# Patient Record
Sex: Female | Born: 1970 | Race: White | Hispanic: No | Marital: Married | Smoking: Former smoker
Health system: Southern US, Community
[De-identification: ages and names within clinical notes are randomized; demographics above are authoritative.]

## PROBLEM LIST (undated history)

## (undated) DIAGNOSIS — Z9889 Other specified postprocedural states: Secondary | ICD-10-CM

## (undated) DIAGNOSIS — Z1509 Genetic susceptibility to other malignant neoplasm: Secondary | ICD-10-CM

## (undated) DIAGNOSIS — Z87898 Personal history of other specified conditions: Secondary | ICD-10-CM

## (undated) DIAGNOSIS — Z1589 Genetic susceptibility to other disease: Secondary | ICD-10-CM

## (undated) DIAGNOSIS — E7212 Methylenetetrahydrofolate reductase deficiency: Secondary | ICD-10-CM

## (undated) DIAGNOSIS — Z923 Personal history of irradiation: Secondary | ICD-10-CM

## (undated) DIAGNOSIS — Z973 Presence of spectacles and contact lenses: Secondary | ICD-10-CM

## (undated) DIAGNOSIS — R51 Headache: Secondary | ICD-10-CM

## (undated) DIAGNOSIS — Z803 Family history of malignant neoplasm of breast: Secondary | ICD-10-CM

## (undated) DIAGNOSIS — Z1501 Genetic susceptibility to malignant neoplasm of breast: Secondary | ICD-10-CM

## (undated) DIAGNOSIS — R519 Headache, unspecified: Secondary | ICD-10-CM

## (undated) DIAGNOSIS — Z9221 Personal history of antineoplastic chemotherapy: Secondary | ICD-10-CM

## (undated) DIAGNOSIS — D649 Anemia, unspecified: Secondary | ICD-10-CM

## (undated) DIAGNOSIS — R112 Nausea with vomiting, unspecified: Secondary | ICD-10-CM

## (undated) DIAGNOSIS — C50412 Malignant neoplasm of upper-outer quadrant of left female breast: Secondary | ICD-10-CM

## (undated) DIAGNOSIS — D693 Immune thrombocytopenic purpura: Secondary | ICD-10-CM

## (undated) DIAGNOSIS — F419 Anxiety disorder, unspecified: Secondary | ICD-10-CM

## (undated) DIAGNOSIS — Z8041 Family history of malignant neoplasm of ovary: Secondary | ICD-10-CM

## (undated) HISTORY — DX: Genetic susceptibility to other disease: Z15.89

## (undated) HISTORY — DX: Family history of malignant neoplasm of ovary: Z80.41

## (undated) HISTORY — PX: WISDOM TOOTH EXTRACTION: SHX21

## (undated) HISTORY — DX: Family history of malignant neoplasm of breast: Z80.3

## (undated) HISTORY — DX: Immune thrombocytopenic purpura: D69.3

## (undated) HISTORY — DX: Methylenetetrahydrofolate reductase deficiency: E72.12

## (undated) HISTORY — PX: MASTECTOMY: SHX3

---

## 1999-01-09 ENCOUNTER — Encounter: Payer: Self-pay | Admitting: Obstetrics & Gynecology

## 1999-01-09 ENCOUNTER — Inpatient Hospital Stay (HOSPITAL_COMMUNITY): Admission: AD | Admit: 1999-01-09 | Discharge: 1999-01-09 | Payer: Self-pay | Admitting: Obstetrics and Gynecology

## 1999-03-19 ENCOUNTER — Encounter: Payer: Self-pay | Admitting: Obstetrics and Gynecology

## 1999-03-19 ENCOUNTER — Ambulatory Visit (HOSPITAL_COMMUNITY): Admission: RE | Admit: 1999-03-19 | Discharge: 1999-03-19 | Payer: Self-pay | Admitting: Obstetrics and Gynecology

## 1999-05-15 ENCOUNTER — Observation Stay (HOSPITAL_COMMUNITY): Admission: AD | Admit: 1999-05-15 | Discharge: 1999-05-15 | Payer: Self-pay | Admitting: Obstetrics and Gynecology

## 1999-07-01 ENCOUNTER — Inpatient Hospital Stay: Admission: AD | Admit: 1999-07-01 | Discharge: 1999-07-01 | Payer: Self-pay | Admitting: *Deleted

## 1999-08-03 ENCOUNTER — Inpatient Hospital Stay (HOSPITAL_COMMUNITY): Admission: AD | Admit: 1999-08-03 | Discharge: 1999-08-03 | Payer: Self-pay | Admitting: Obstetrics & Gynecology

## 1999-08-16 ENCOUNTER — Inpatient Hospital Stay (HOSPITAL_COMMUNITY): Admission: AD | Admit: 1999-08-16 | Discharge: 1999-08-19 | Payer: Self-pay | Admitting: *Deleted

## 2001-04-24 ENCOUNTER — Other Ambulatory Visit: Admission: RE | Admit: 2001-04-24 | Discharge: 2001-04-24 | Payer: Self-pay | Admitting: Obstetrics and Gynecology

## 2003-06-03 ENCOUNTER — Other Ambulatory Visit: Admission: RE | Admit: 2003-06-03 | Discharge: 2003-06-03 | Payer: Self-pay | Admitting: *Deleted

## 2003-06-03 ENCOUNTER — Other Ambulatory Visit: Admission: RE | Admit: 2003-06-03 | Discharge: 2003-06-03 | Payer: Self-pay | Admitting: Obstetrics and Gynecology

## 2003-11-14 ENCOUNTER — Inpatient Hospital Stay (HOSPITAL_COMMUNITY): Admission: AD | Admit: 2003-11-14 | Discharge: 2003-11-17 | Payer: Self-pay | Admitting: Obstetrics and Gynecology

## 2003-11-17 IMAGING — US US OB LIMITED
1 series · 4 of 4 positions shown · non-contrast
Comparison: none

CLINICAL DATA: 33 weeks estimated gestational age.  Assess amniotic fluid index.

[Series 1: unknown · 0.32mm/px · 4 of 4 slices shown]
[im 1/4]
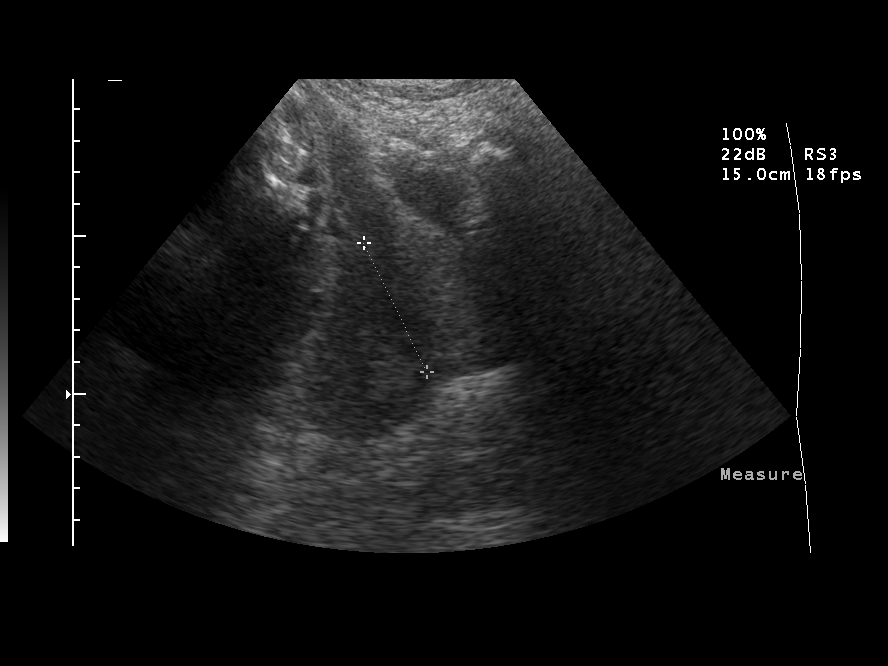
[im 2/4]
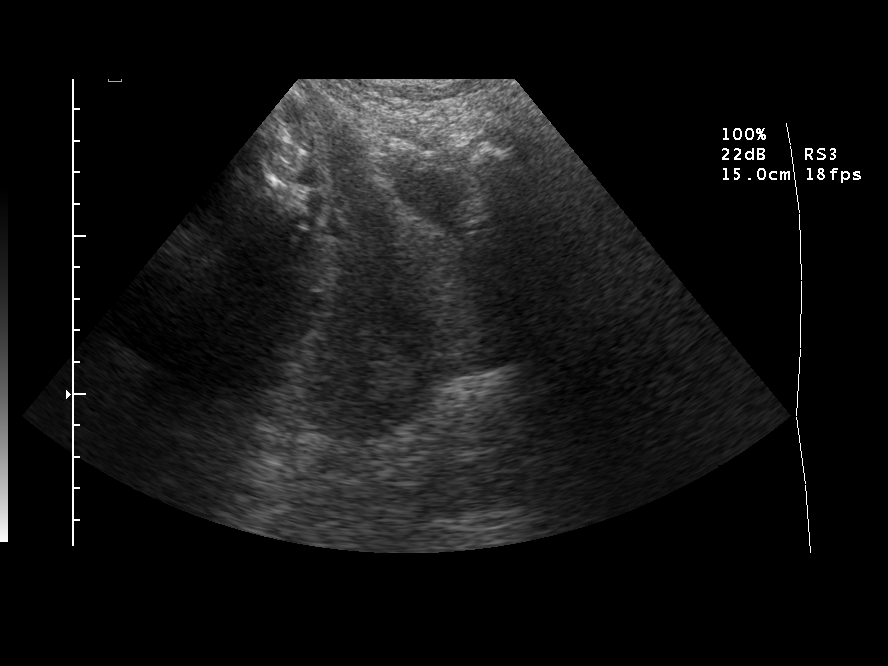
[im 3/4]
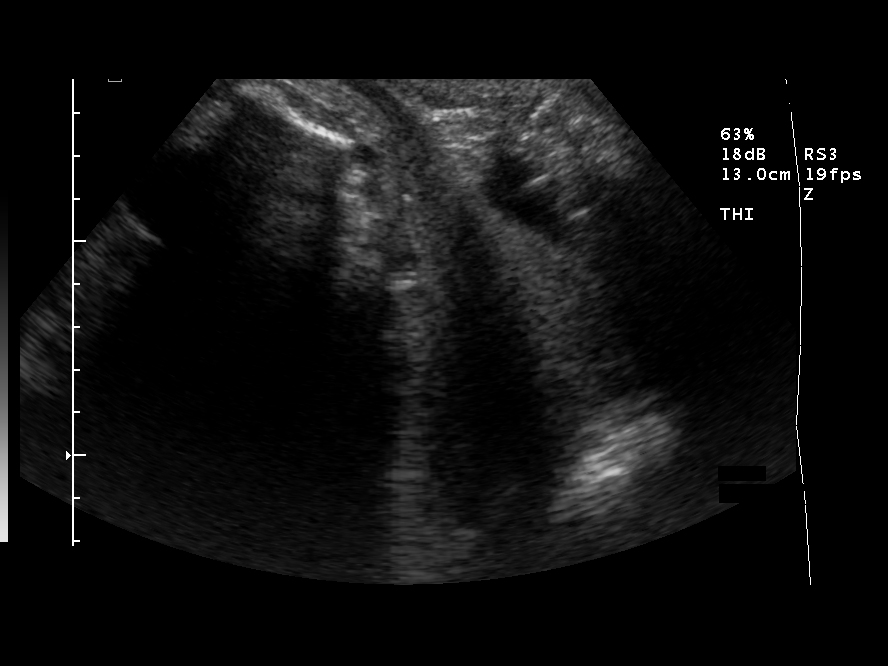
[im 4/4]
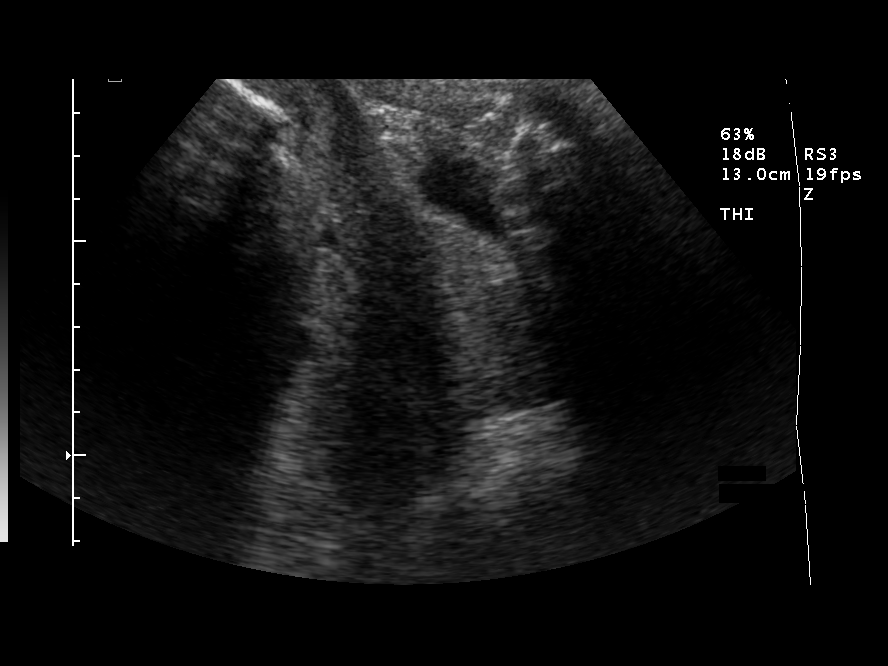

[4 of 4 positions shown; findings below may reference images not displayed]

LIMITED OBSTETRICAL ULTRASOUND
Number of Fetuses:  1
Heart Rate:  140
Movement:  Yes
Breathing:  No
Presentation:  Oblique/transverse with head to maternal right
Placental Location:  Fundal, left lateral
Grade:  II
Previa:  No
Amniotic Fluid (Subjective):  Low normal
Amniotic Fluid (Objective):  12.7 cm AFI (5th -95th%ile =   8.1 ? 24.8 cm for 34 wks)

Fetal measurements and complete anatomic evaluation were not requested.  The following fetal anatomy was visualized during this exam:  Lateral ventricles, thalami, stomach, 3-vessel cord, kidneys, and bladder.

MATERNAL FINDINGS
Cervix:  4.5 cm Transabdominally
IMPRESSION: Subjectively low normal and quantitatively normal amniotic fluid volume.
Normal cervical length.  
No late developing fetal anatomic abnormalities are identified associated with the lateral ventricles, stomach, kidneys, bladder or four chamber heart.

## 2003-11-17 IMAGING — US US OB LIMITED
1 series · 14 of 28 positions shown · non-contrast
Comparison: none

CLINICAL DATA: 33 weeks estimated gestational age.  Assess amniotic fluid index.

[Series 1: unknown · 0.30mm/px · 14 of 32 slices shown]
[im 2/32]
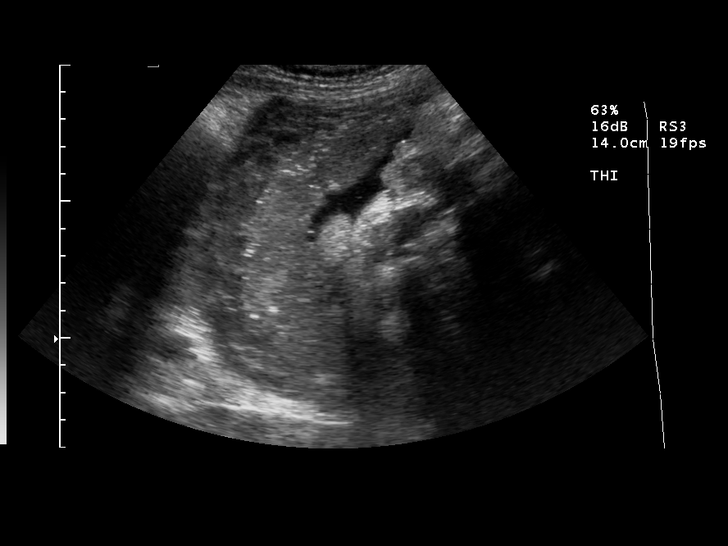
[im 4/32]
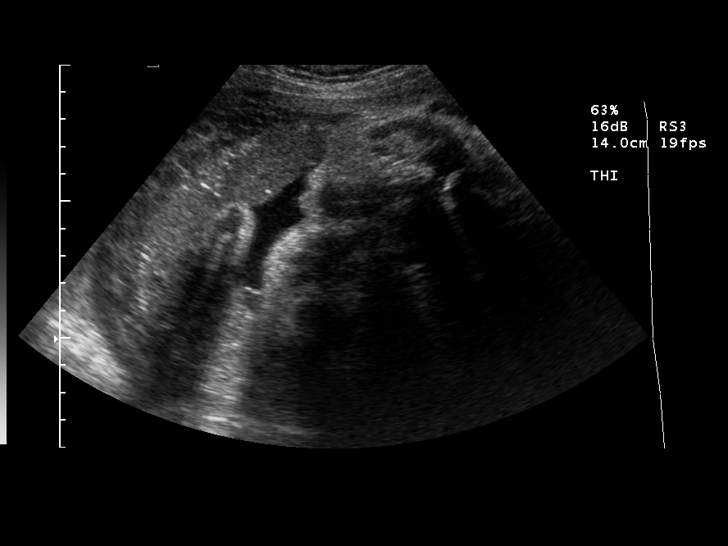
[im 6/32]
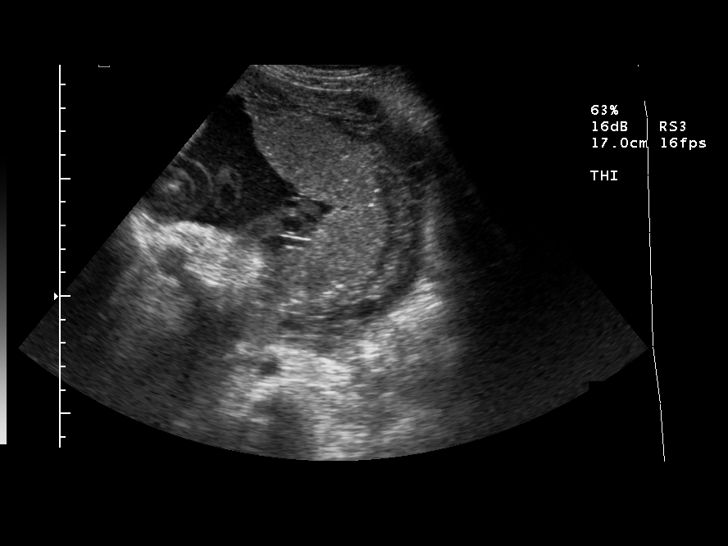
[im 9/32]
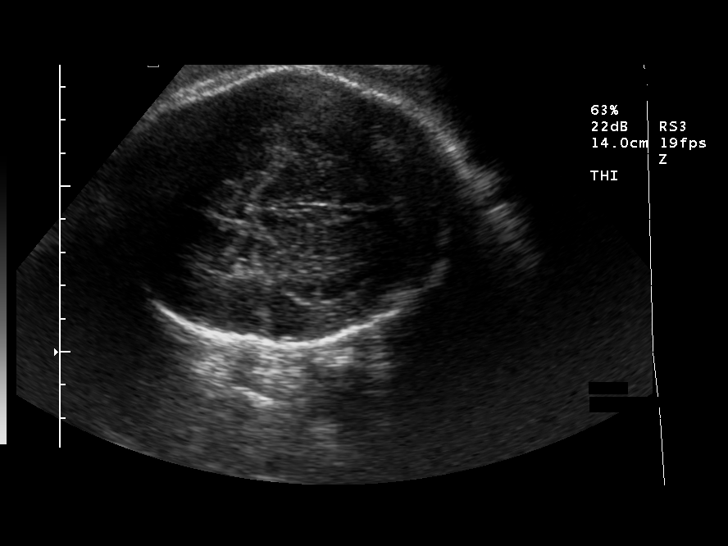
[im 11/32]
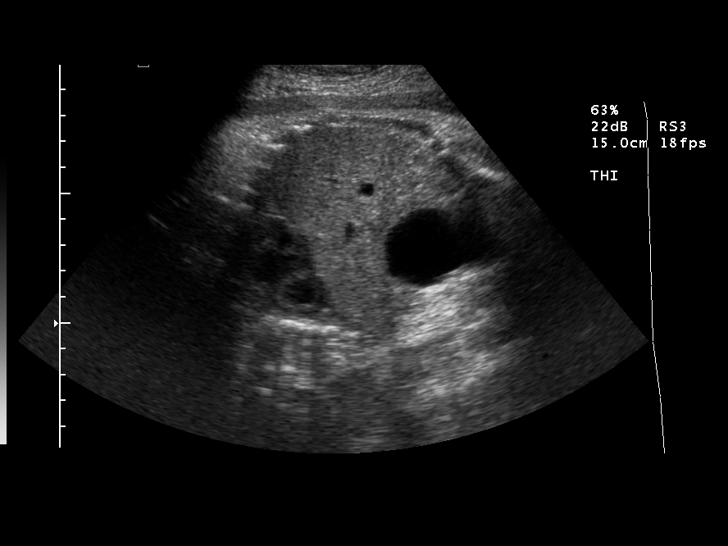
[im 13/32]
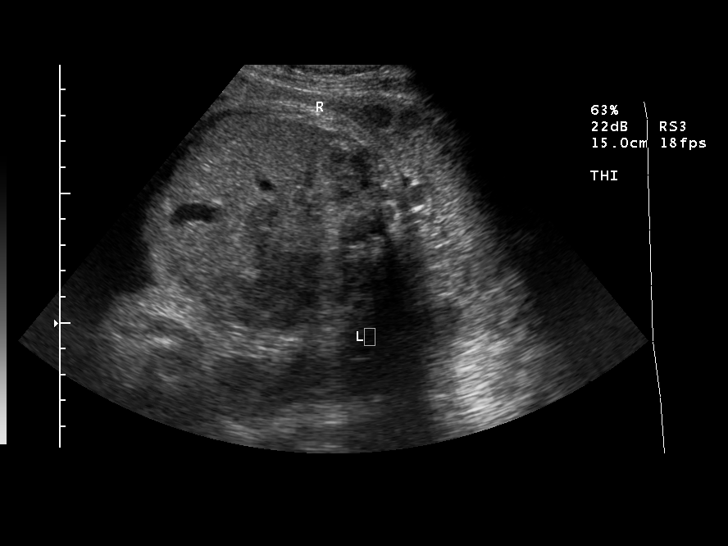
[im 15/32]
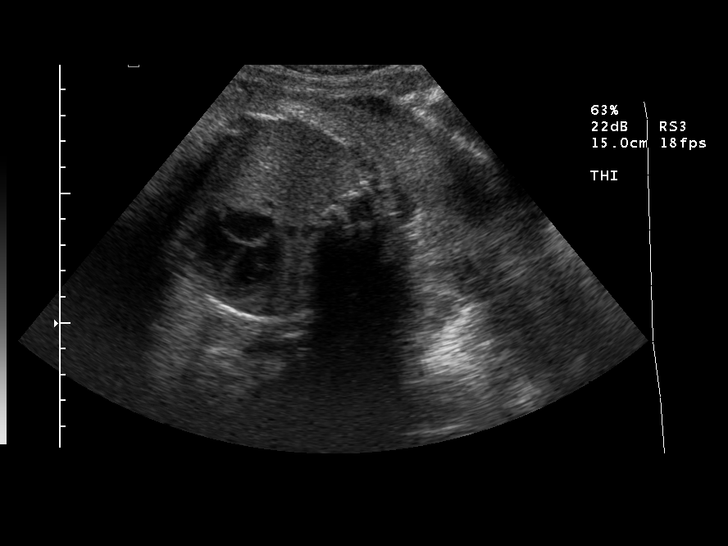
[im 18/32]
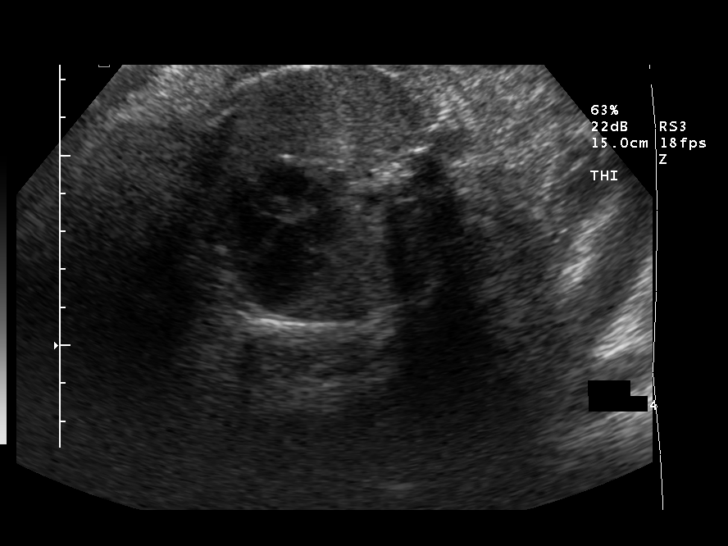
[im 20/32]
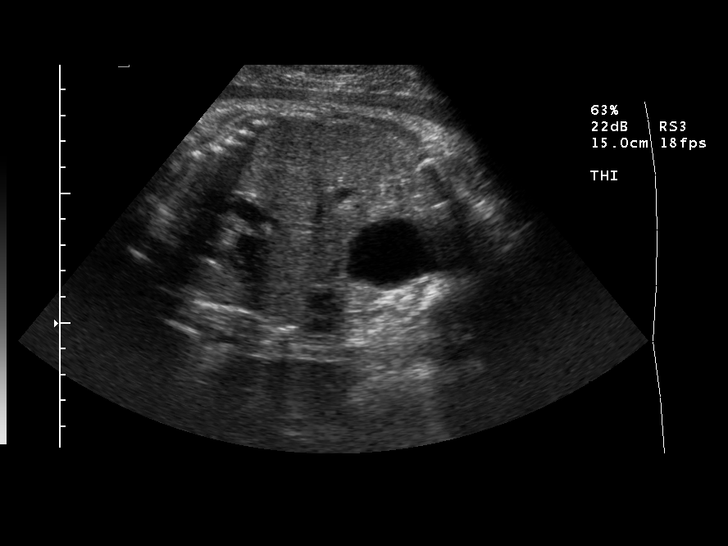
[im 22/32]
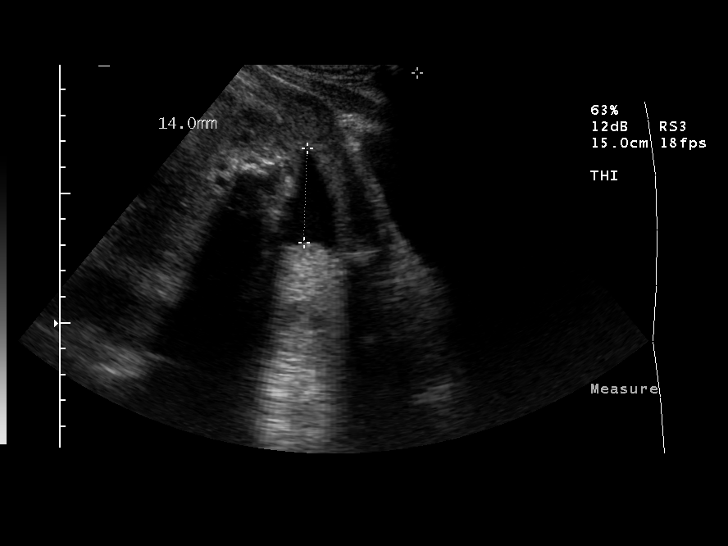
[im 25/32]
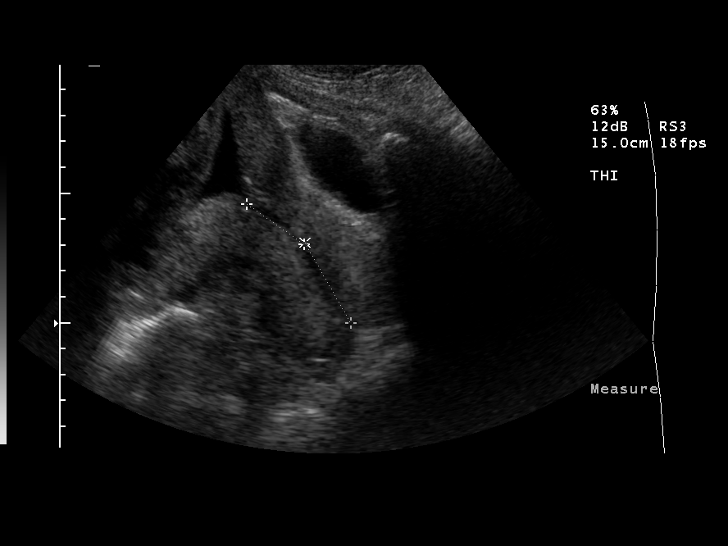
[im 27/32]
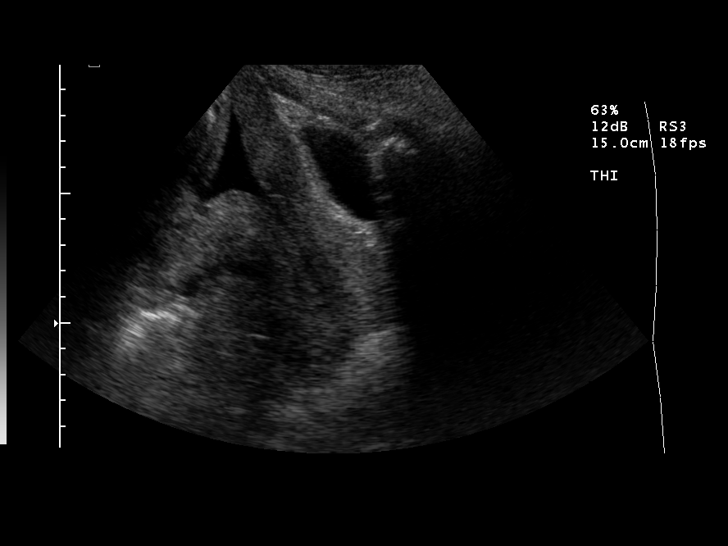
[im 29/32]
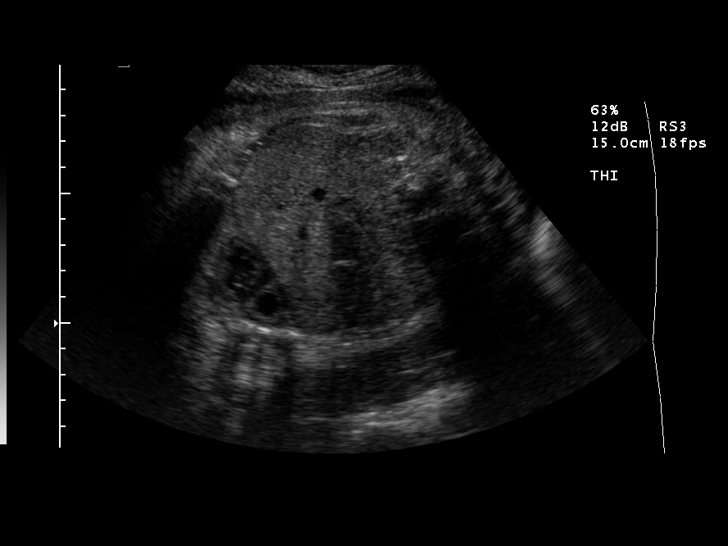
[im 32/32]
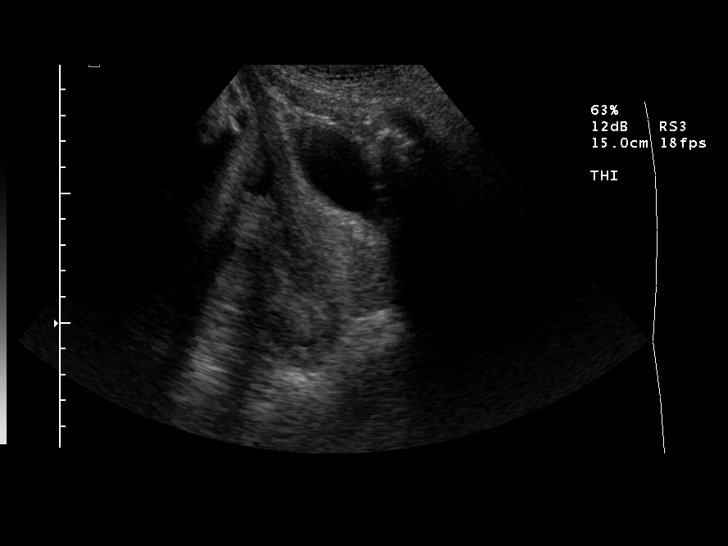

[14 of 28 positions shown; findings below may reference images not displayed]

LIMITED OBSTETRICAL ULTRASOUND
Number of Fetuses:  1
Heart Rate:  140
Movement:  Yes
Breathing:  No
Presentation:  Oblique/transverse with head to maternal right
Placental Location:  Fundal, left lateral
Grade:  II
Previa:  No
Amniotic Fluid (Subjective):  Low normal
Amniotic Fluid (Objective):  12.7 cm AFI (5th -95th%ile =   8.1 ? 24.8 cm for 34 wks)

Fetal measurements and complete anatomic evaluation were not requested.  The following fetal anatomy was visualized during this exam:  Lateral ventricles, thalami, stomach, 3-vessel cord, kidneys, and bladder.

MATERNAL FINDINGS
Cervix:  4.5 cm Transabdominally
IMPRESSION: Subjectively low normal and quantitatively normal amniotic fluid volume.
Normal cervical length.  
No late developing fetal anatomic abnormalities are identified associated with the lateral ventricles, stomach, kidneys, bladder or four chamber heart.

## 2003-12-20 ENCOUNTER — Inpatient Hospital Stay (HOSPITAL_COMMUNITY): Admission: AD | Admit: 2003-12-20 | Discharge: 2003-12-22 | Payer: Self-pay | Admitting: Obstetrics and Gynecology

## 2004-06-03 ENCOUNTER — Other Ambulatory Visit: Admission: RE | Admit: 2004-06-03 | Discharge: 2004-06-03 | Payer: Self-pay | Admitting: Obstetrics and Gynecology

## 2005-04-07 ENCOUNTER — Ambulatory Visit: Payer: Self-pay | Admitting: Family Medicine

## 2005-07-13 ENCOUNTER — Ambulatory Visit: Payer: Self-pay | Admitting: Family Medicine

## 2005-08-03 ENCOUNTER — Other Ambulatory Visit: Admission: RE | Admit: 2005-08-03 | Discharge: 2005-08-03 | Payer: Self-pay | Admitting: Obstetrics and Gynecology

## 2006-03-05 IMAGING — CR DG CHEST 2V
2 series · 2 of 2 positions shown · non-contrast
Comparison: none

CLINICAL DATA: Wheezing, cough, congestion for about a month.  Smoker.
 CHEST - 2 VIEW:
 No priors for comparison.

[view not recorded (1 of 2)]
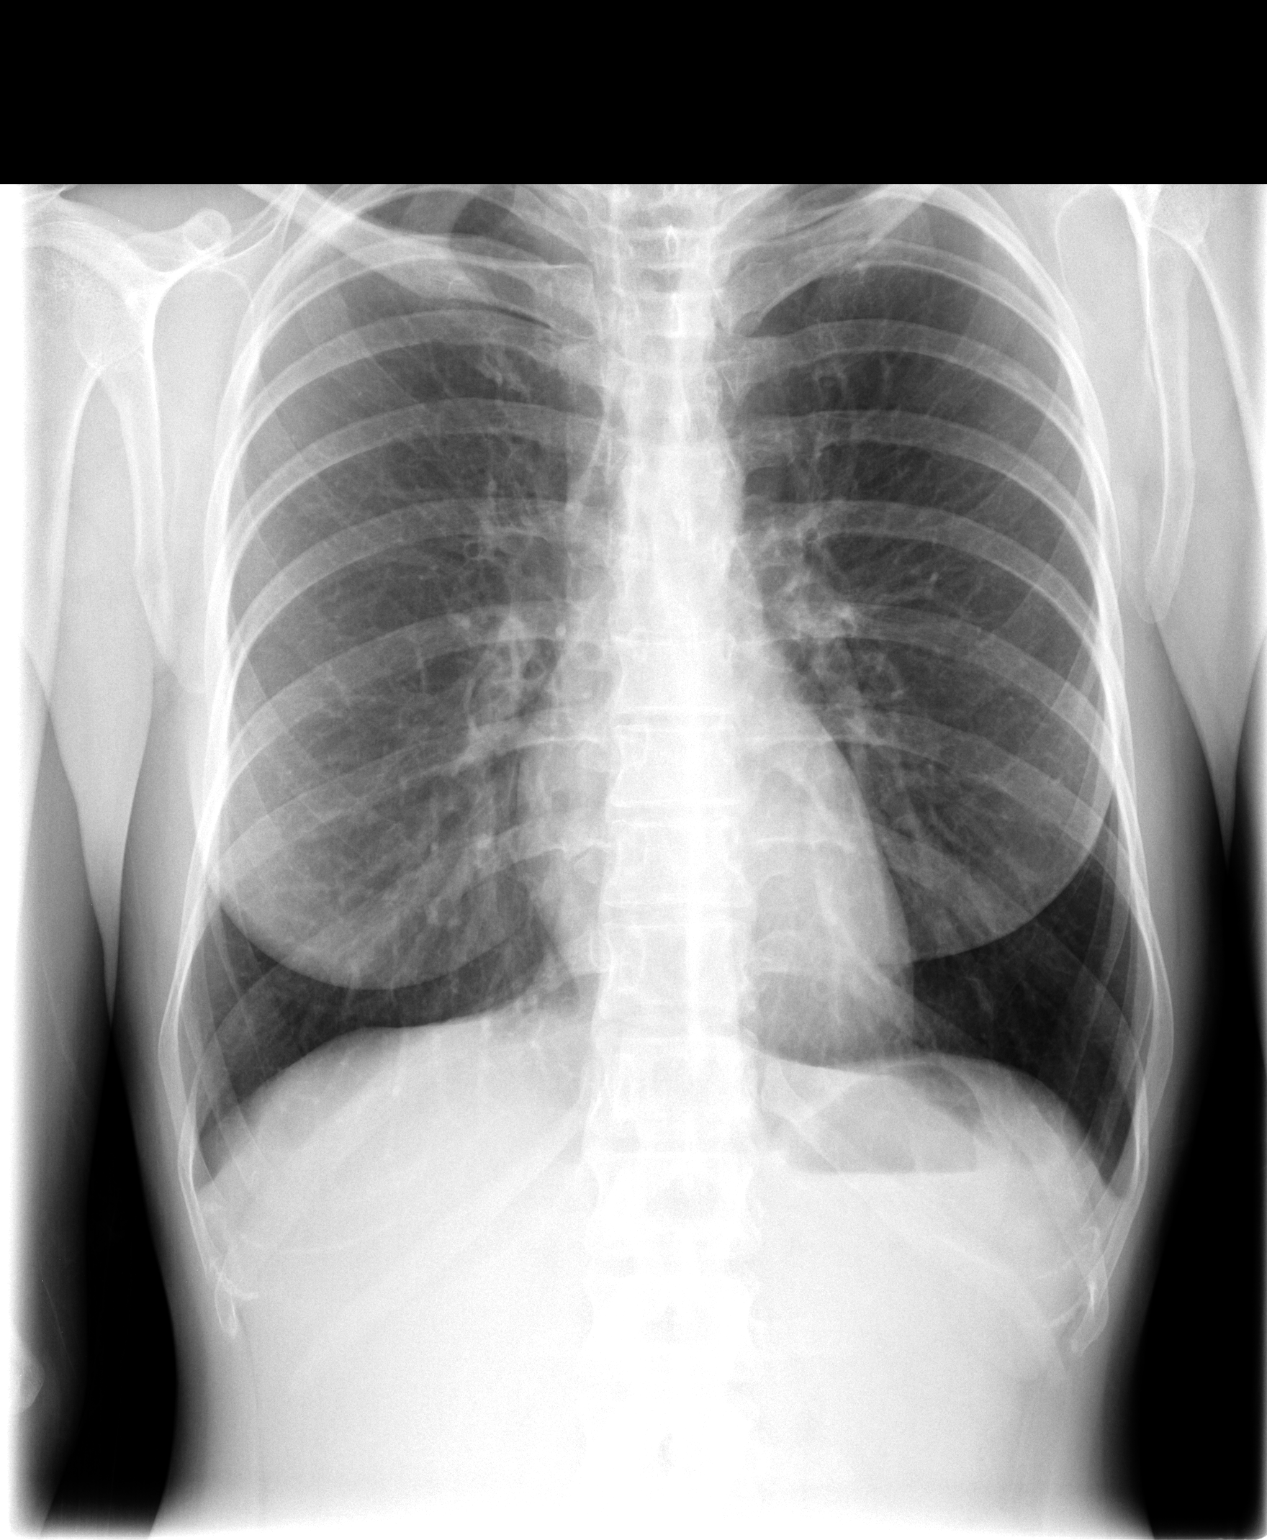

[view not recorded (2 of 2)]
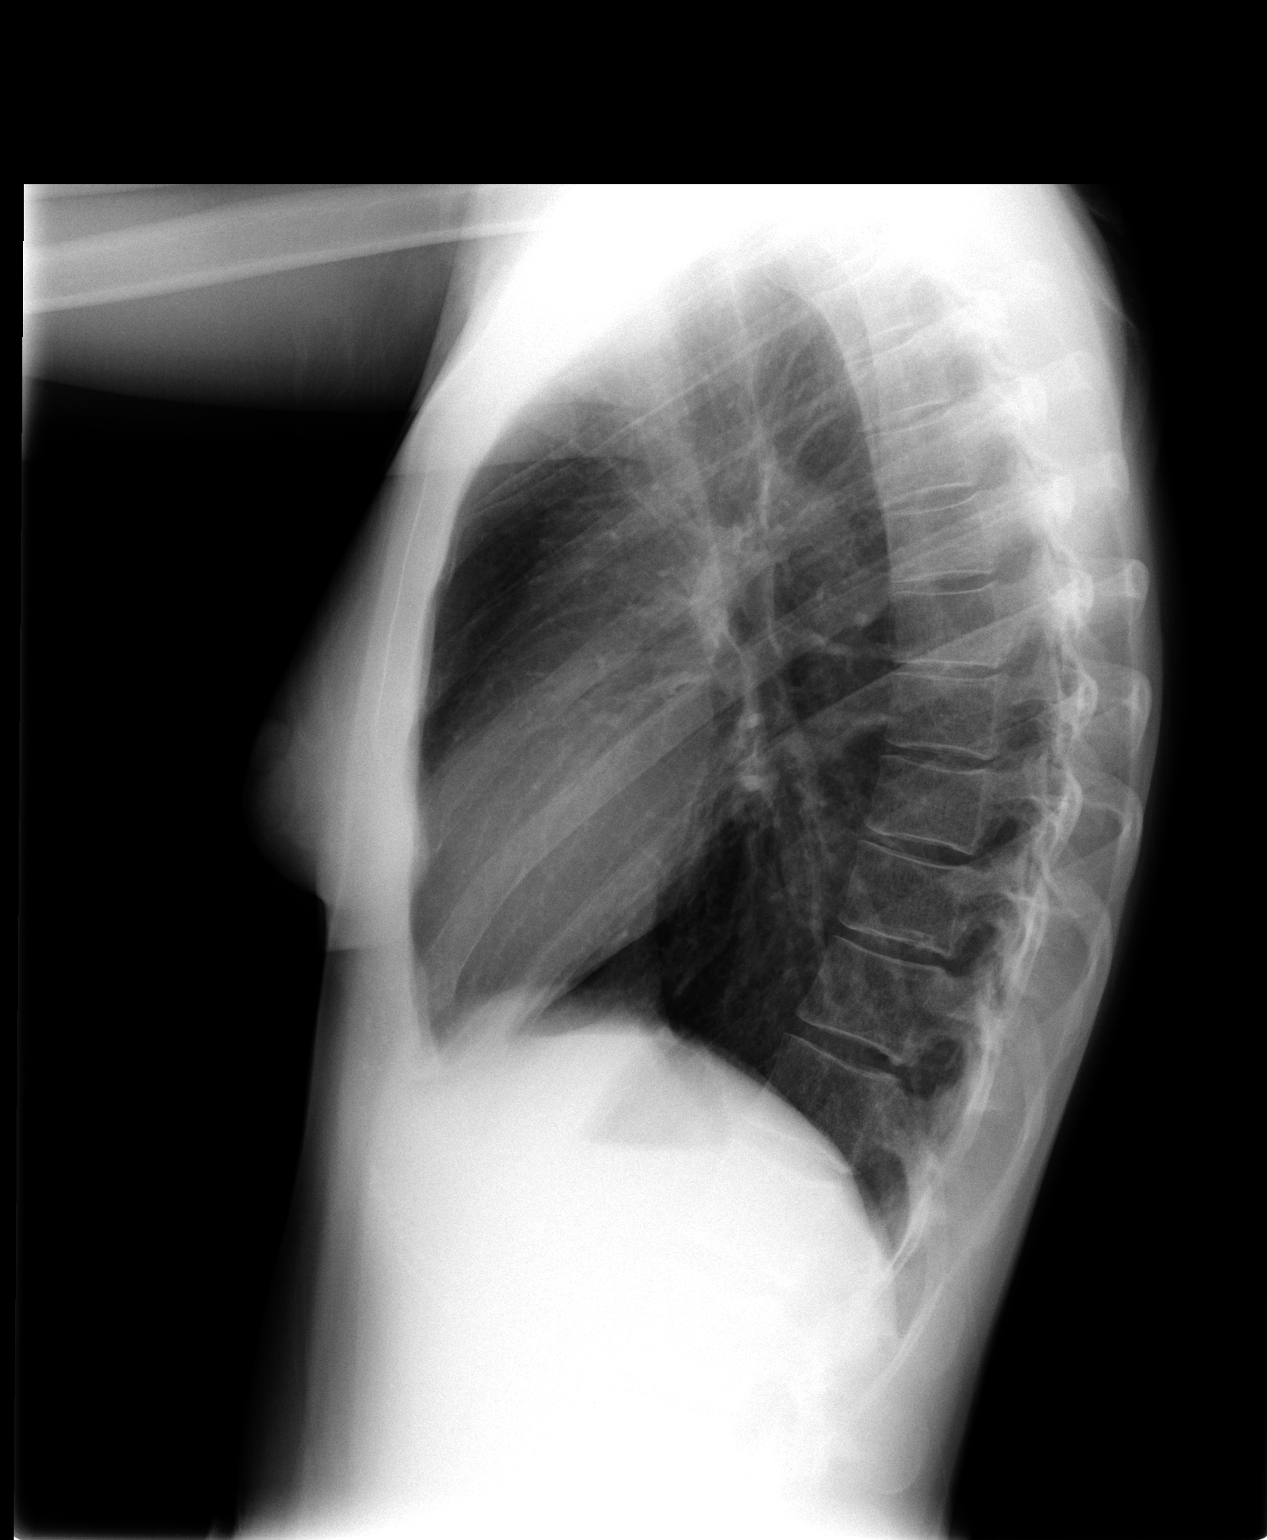

[2 of 2 positions shown; findings below may reference images not displayed]

FINDINGS: The heart size and mediastinal contours are within normal limits.  Both lungs are clear.  The visualized skeletal structures are unremarkable.
IMPRESSION: No active cardiopulmonary disease.

## 2006-10-26 ENCOUNTER — Ambulatory Visit: Payer: Self-pay | Admitting: Family Medicine

## 2006-11-06 ENCOUNTER — Ambulatory Visit: Payer: Self-pay | Admitting: Internal Medicine

## 2006-11-14 ENCOUNTER — Encounter: Admission: RE | Admit: 2006-11-14 | Discharge: 2006-11-14 | Payer: Self-pay | Admitting: Family Medicine

## 2006-11-14 ENCOUNTER — Ambulatory Visit: Payer: Self-pay | Admitting: Family Medicine

## 2006-11-14 IMAGING — CR DG SINUSES COMPLETE 3+V
3 series · 3 of 3 positions shown · non-contrast
Comparison: none

CLINICAL DATA: Bilateral facial pain, greater on the right. Headaches for the
past month.

PARANASAL SINUSES - 3 VIEW

[view not recorded (1 of 3)]
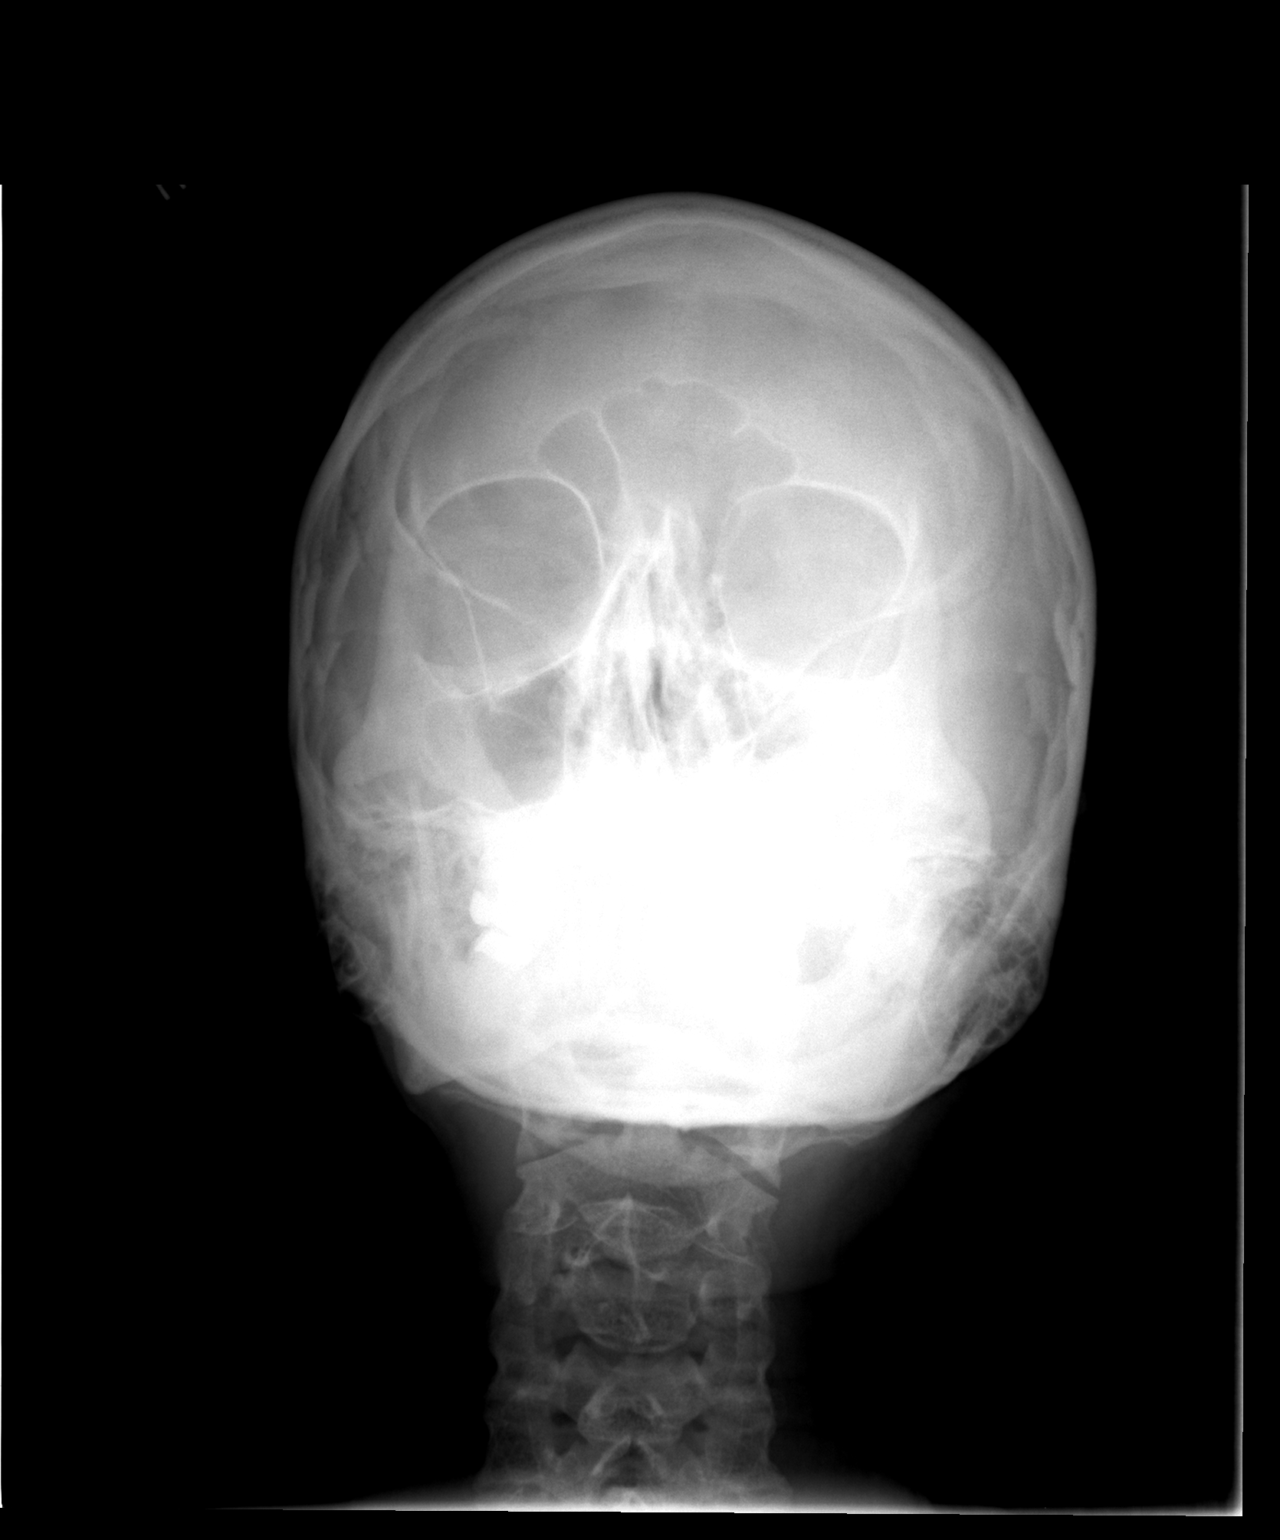

[view not recorded (2 of 3)]
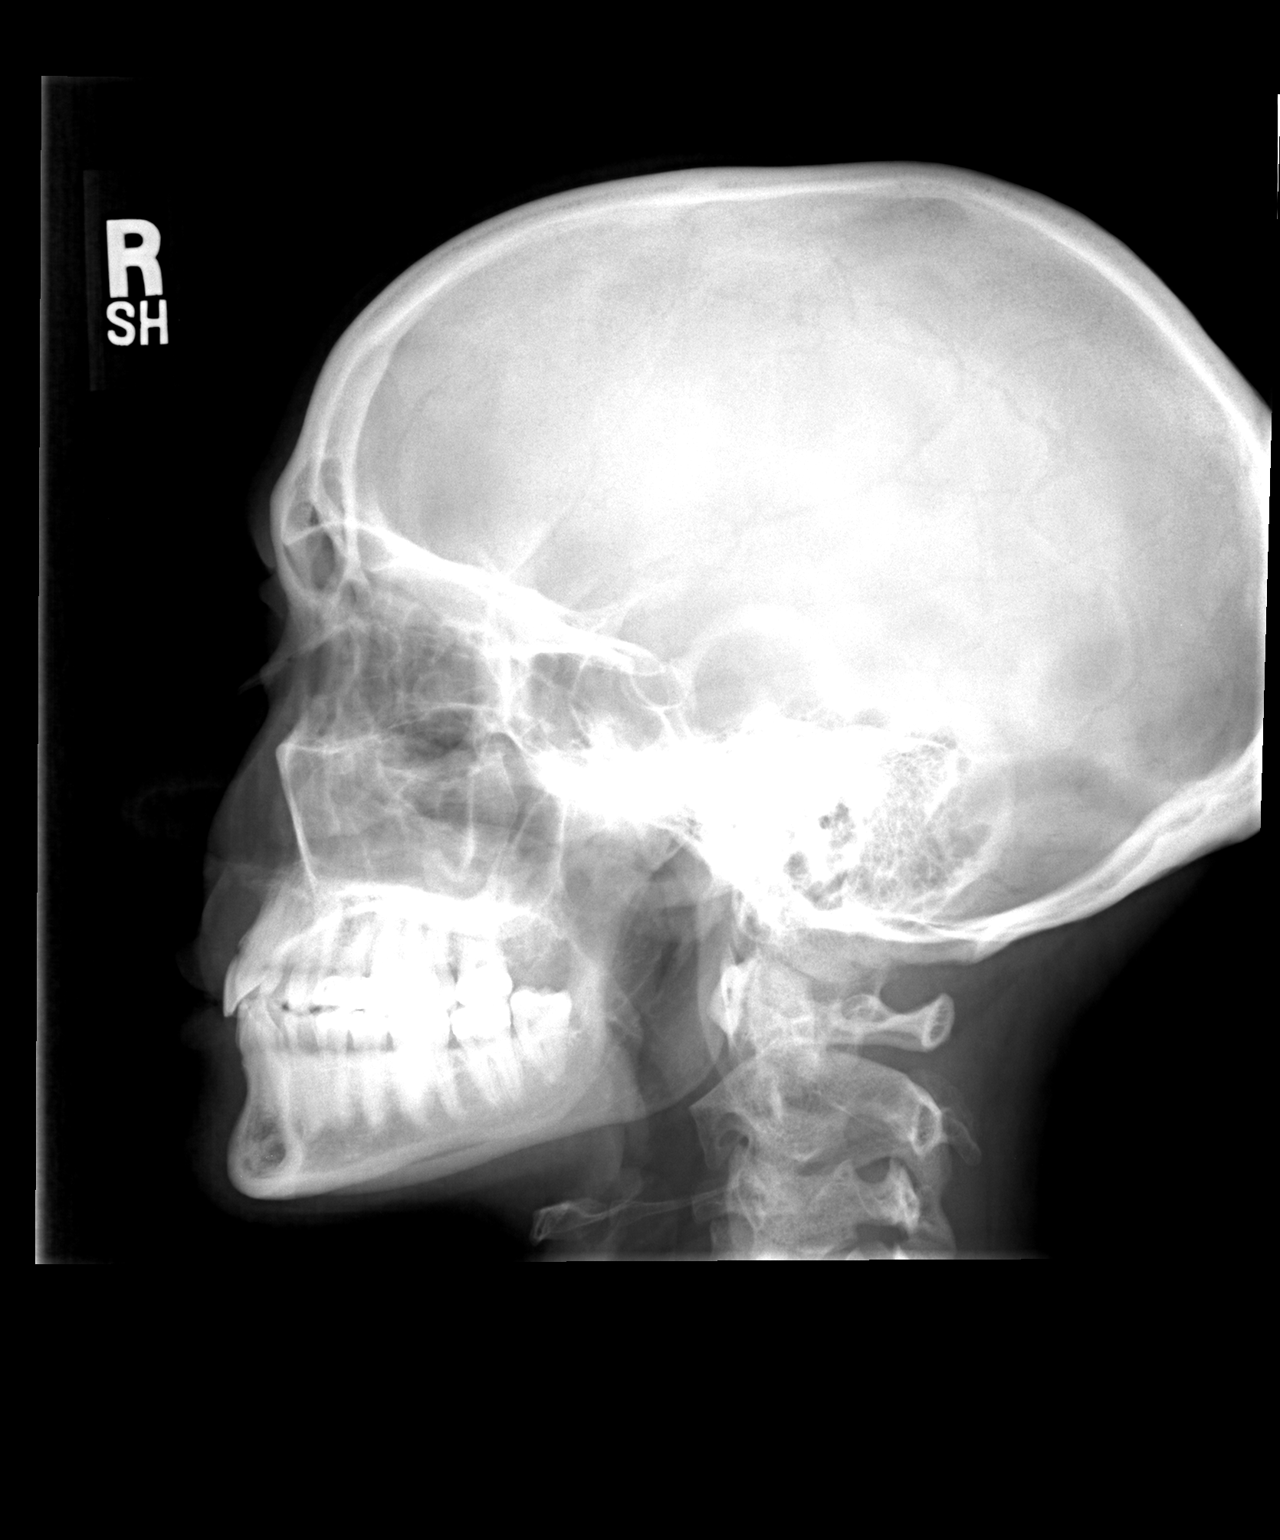

[view not recorded (3 of 3)]
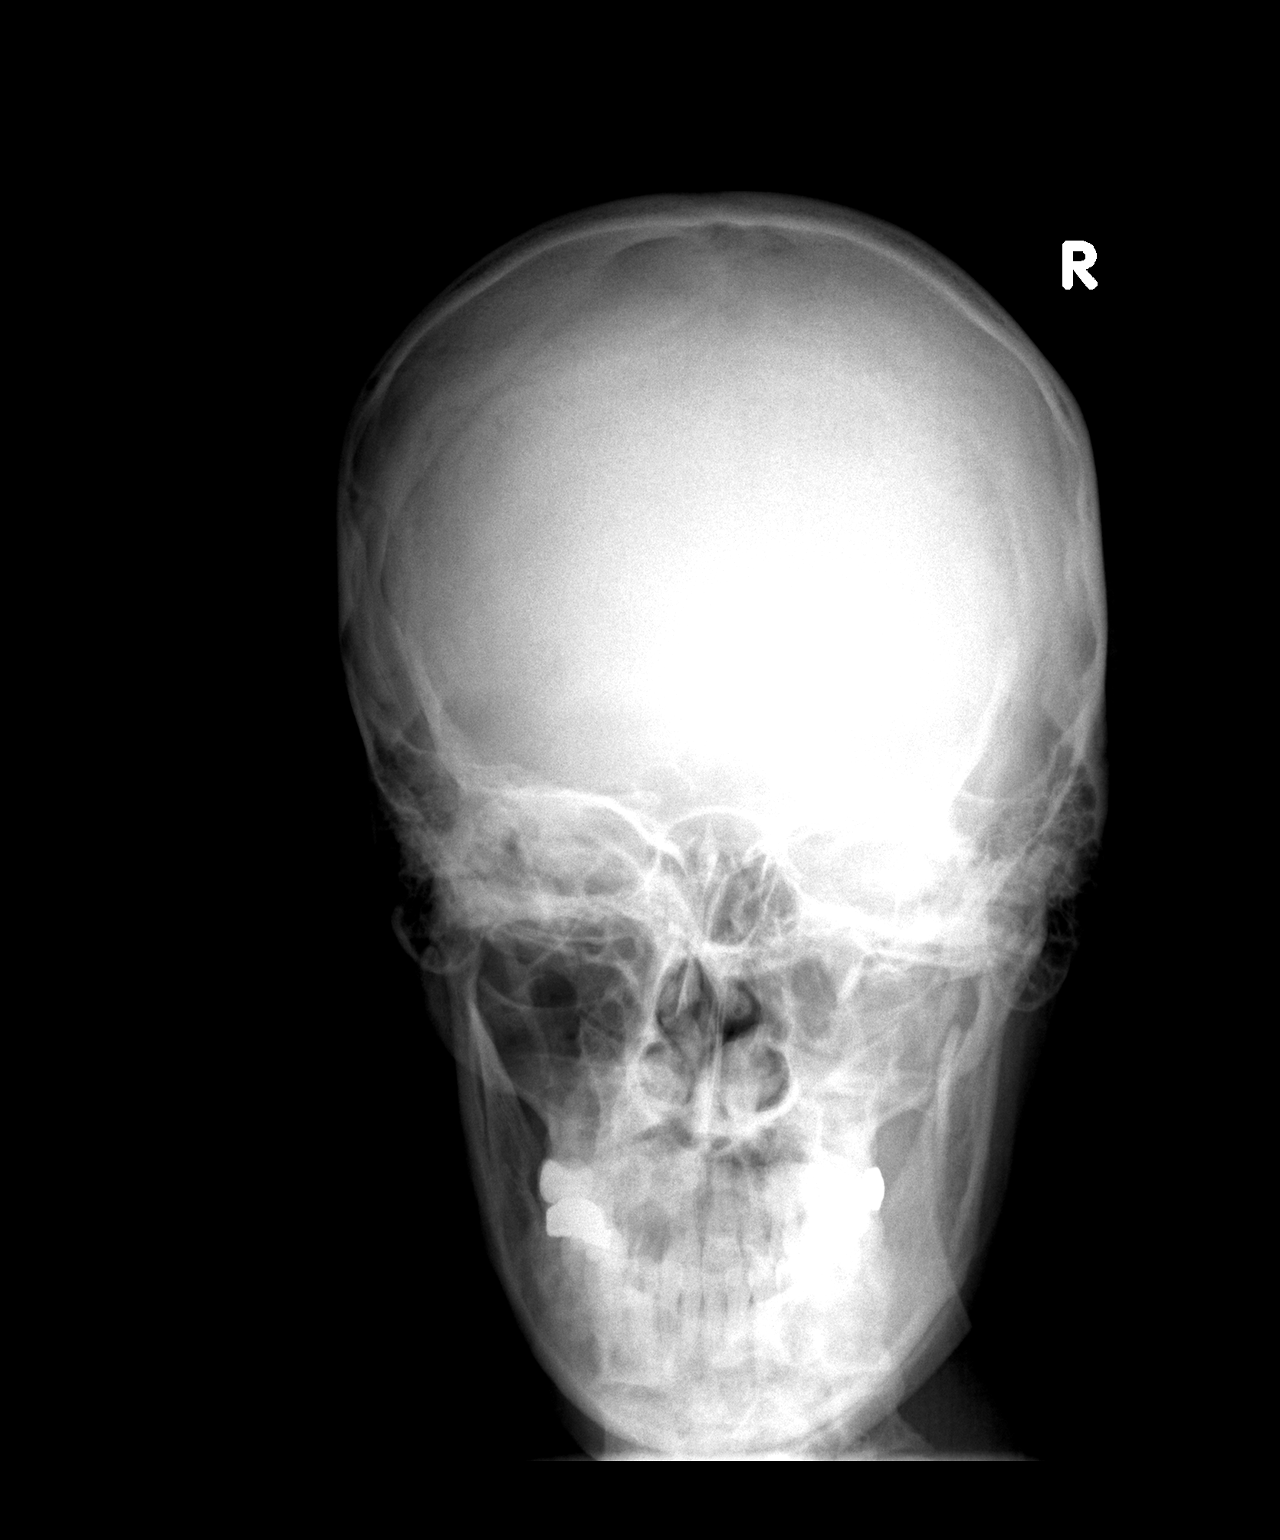

[3 of 3 positions shown; findings below may reference images not displayed]

FINDINGS: Marked left and moderate right maxillary sinus mucosal thickening. No
definite air-fluid levels. The remainder of the paranasal sinuses are grossly
normally pneumatized.

IMPRESSION

Marked left and moderate right chronic maxillary sinusitis.

## 2006-11-23 ENCOUNTER — Ambulatory Visit: Payer: Self-pay | Admitting: Family Medicine

## 2006-11-23 ENCOUNTER — Ambulatory Visit (HOSPITAL_COMMUNITY): Admission: RE | Admit: 2006-11-23 | Discharge: 2006-11-23 | Payer: Self-pay | Admitting: Family Medicine

## 2006-11-23 LAB — CONVERTED CEMR LAB: hCG, Beta Chain, Quant, S: <2 milliintl units/mL

## 2006-11-23 IMAGING — US US PELVIS COMPLETE MODIFY
1 series · 13 of 25 positions shown · non-contrast
Comparison: none

CLINICAL DATA: Right lower quadrant pain. Evaluate for tubal gestation.  The patient reports negative home pregnancy test.  
 TRANSABDOMINAL AND TRANSVAGINAL PELVIC ULTRASOUND:
TECHNIQUE: Both transabdominal and transvaginal ultrasound examinations of the pelvis were performed including evaluation of the uterus, ovaries, adnexal regions, and pelvic cul-de-sac.

[Series 1: us pelvis complete · 13 of 55 slices shown]
[im 1/55]
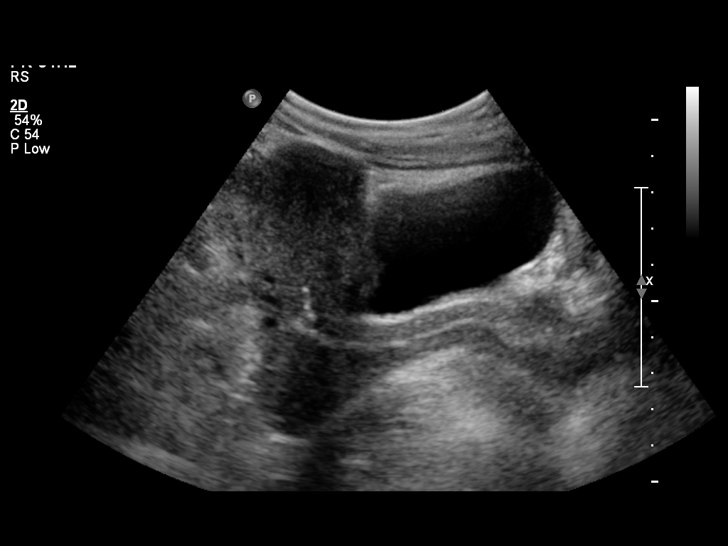
[im 5/55]
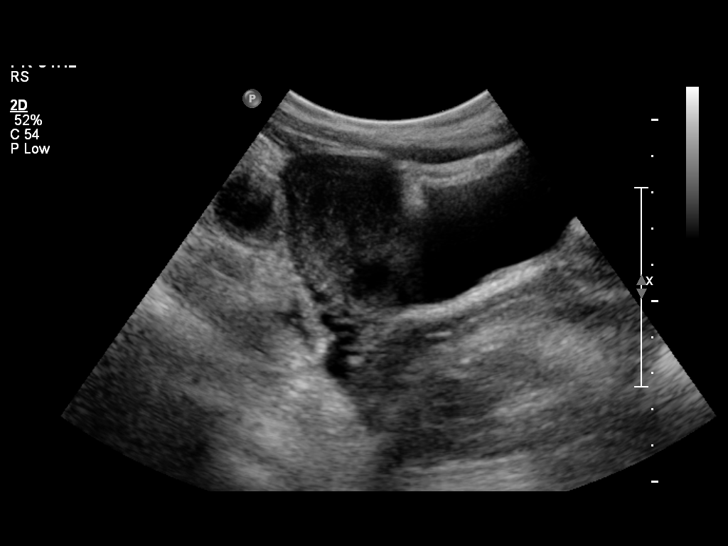
[im 10/55]
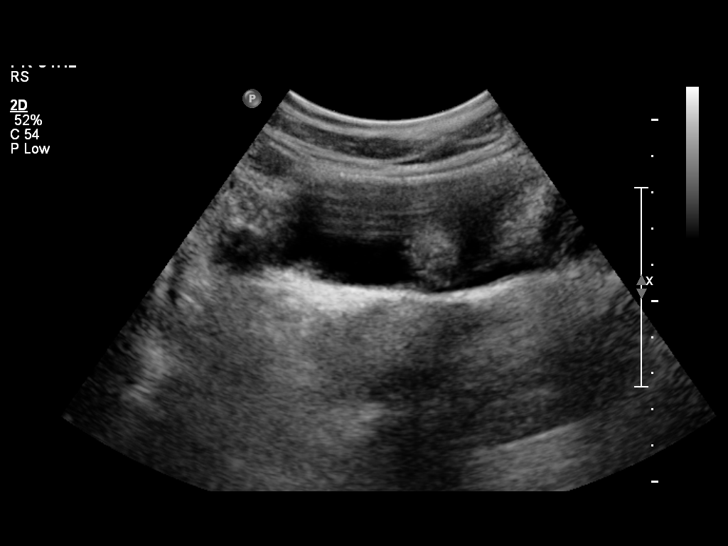
[im 14/55]
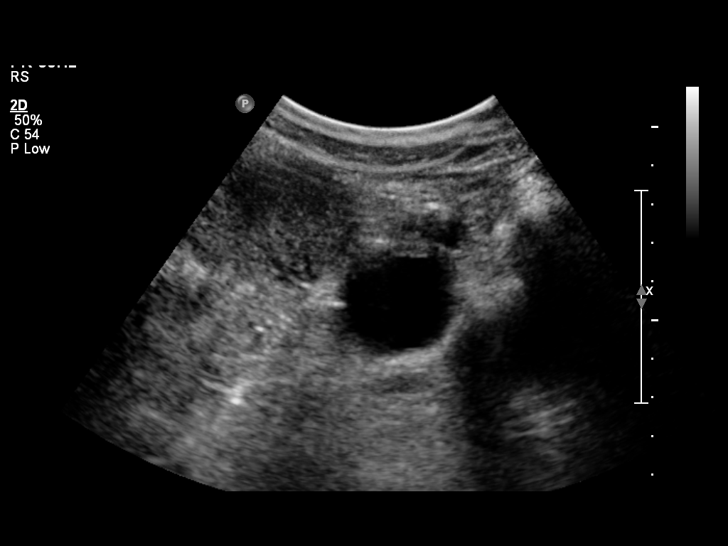
[im 19/55]
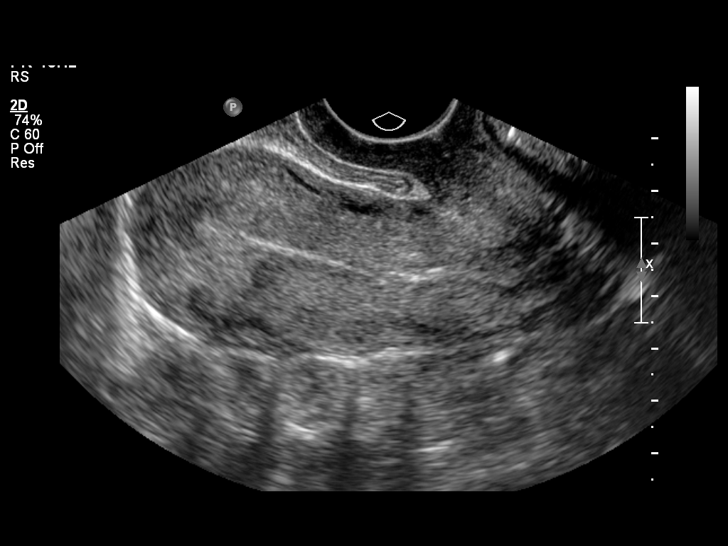
[im 23/55]
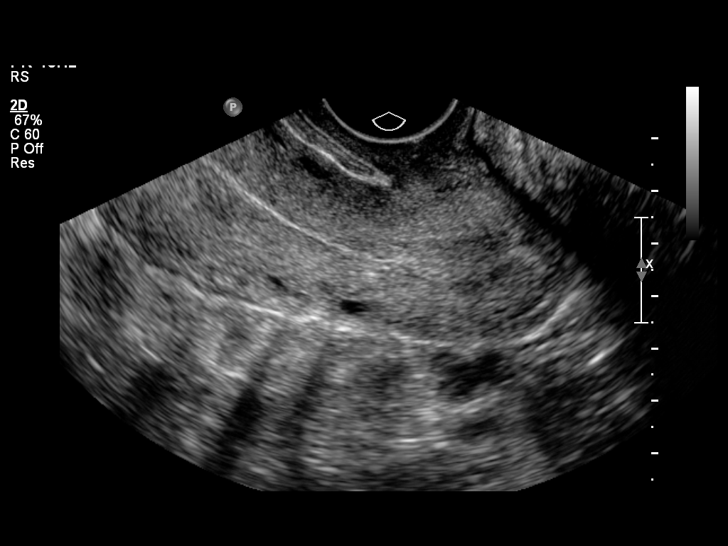
[im 28/55]
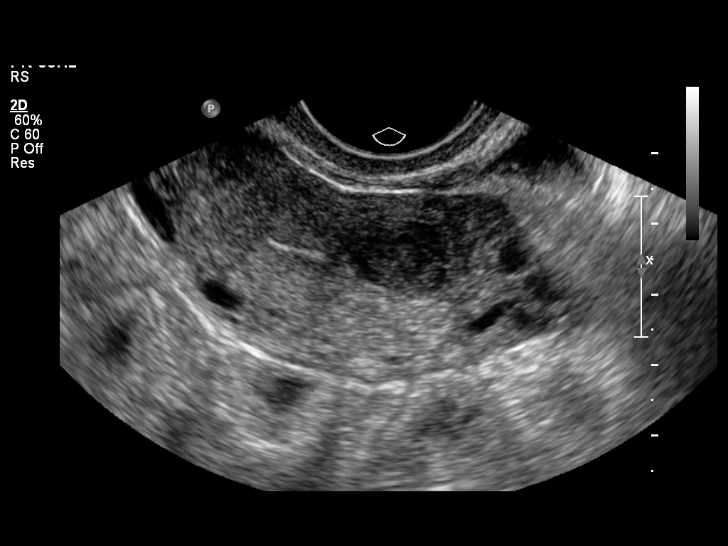
[im 32/55]
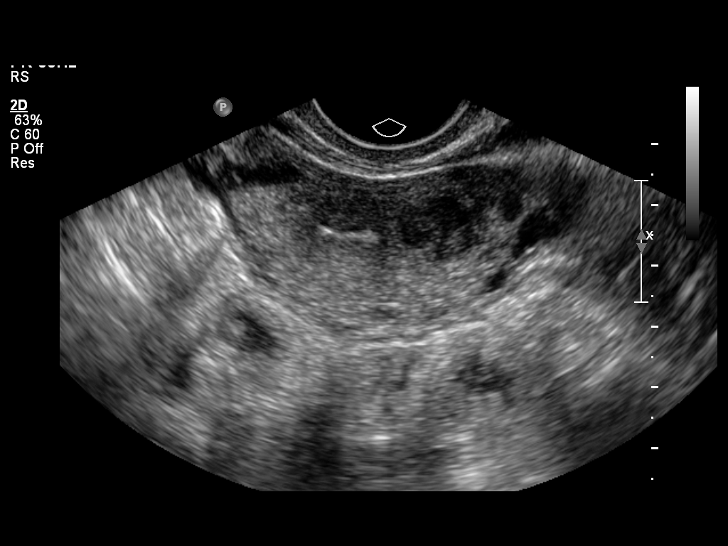
[im 37/55]
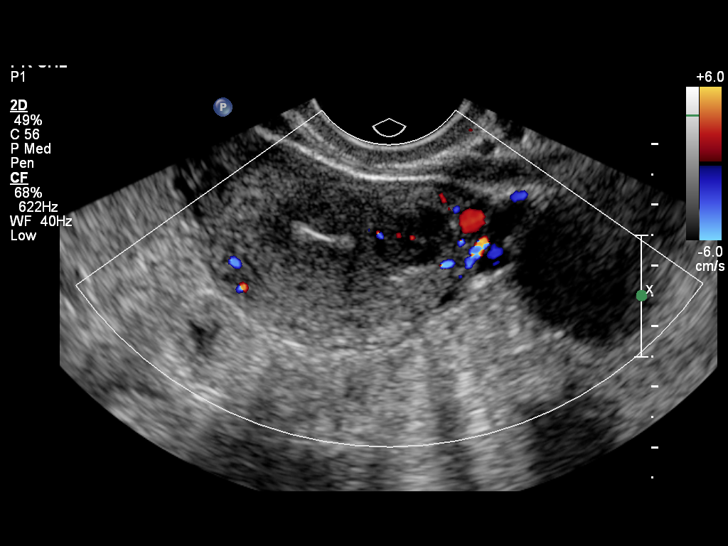
[im 41/55]
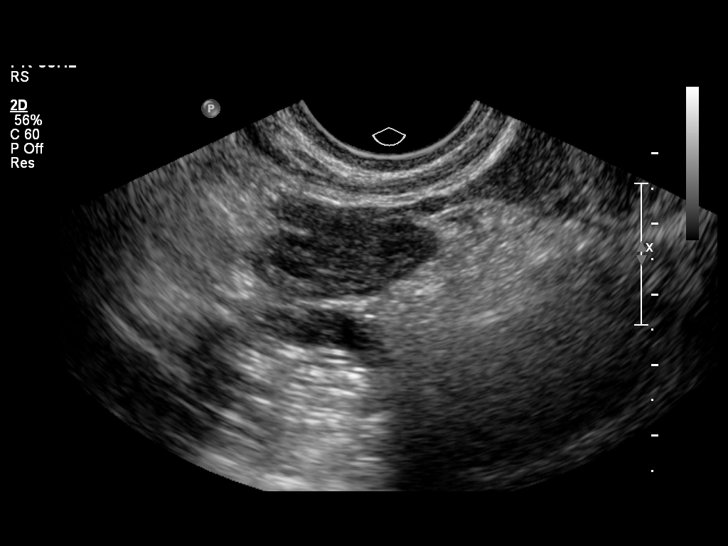
[im 46/55]
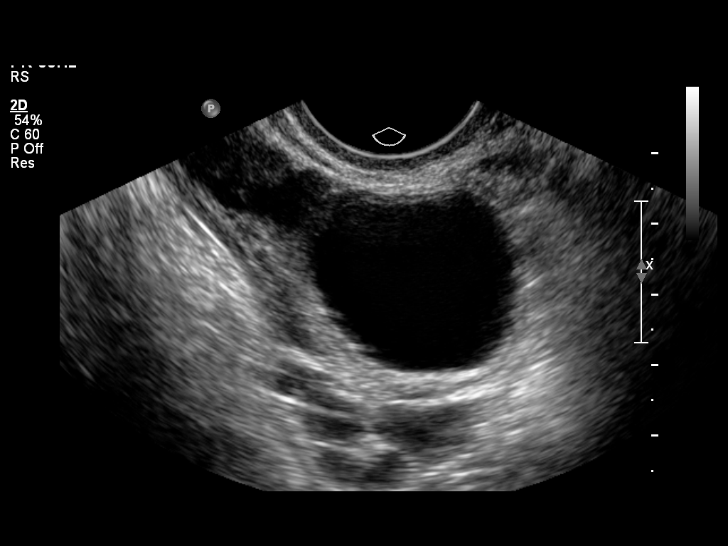
[im 50/55]
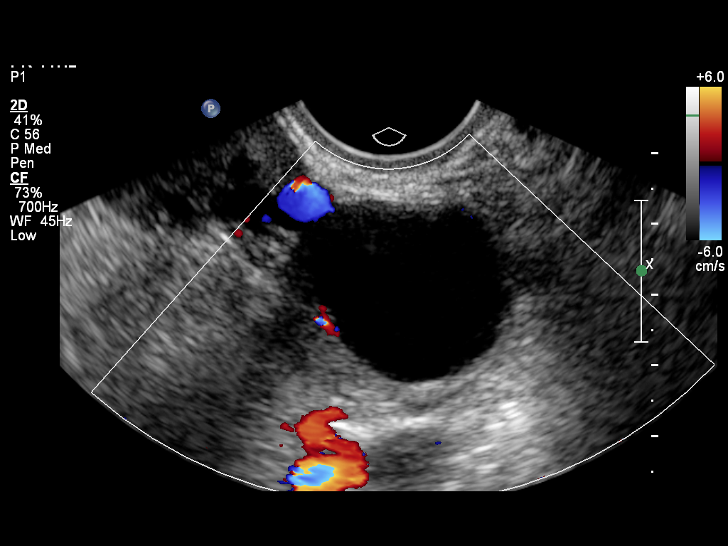
[im 55/55]
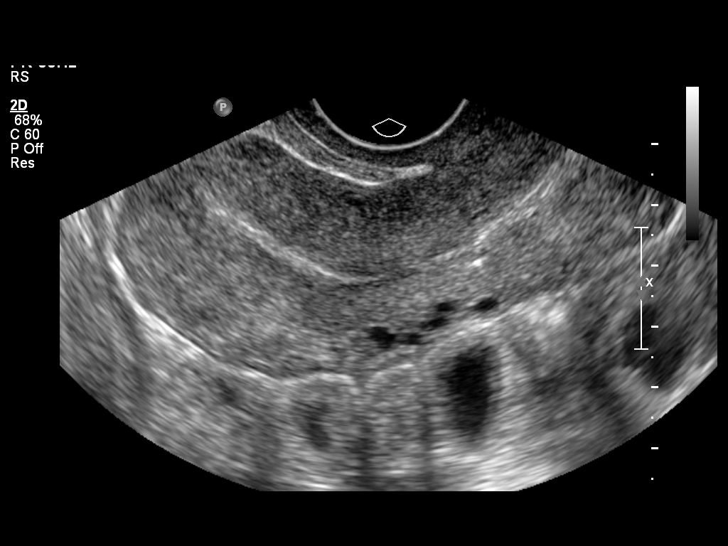

[13 of 25 positions shown; findings below may reference images not displayed]

FINDINGS: Multiple images of the uterus and adnexa were obtained using transabdominal and endovaginal approaches.   The uterus has a sagittal length of 8.1 cm, an AP width of 3.6 cm and a transverse width of 4.9 cm.  The uterine myometrium is notable for one are of focally altered echotexture in the left lateral mid body measuring 1.4 x 1.4 x .8 cm and compatible with a focal fibroid.  This appears to minimally abut the endometrial lining on the left and raises the suspicion of a small submucosal component. 
 The endometrial canal is thin and echogenic with an AP width of .35 cm.  No areas of focal thickening or inhomogeneity are noted.  
 The right ovary has a normal appearance measuring 2.6 x 1.1 x 1.4 cm.  The left ovary measures 3.5 x 3.4 x 2.9 cm and contains a unilocular simple cyst measuring 2.3 x 2.7 x 2.4 cm.  No separate adnexal masses are noted and no sonographic signs of ectopic gestation are seen.  No pelvic fluid is noted.
IMPRESSION: 1.  Focal fibroid with possible small submucosal component. 
 2.  Thin endometrial stripe with no signs of an intrauterine gestation. 
 3.  Normal right ovary and simple left ovarian cyst.  No signs of an ectopic gestation are seen.  Correlation with serum quantitative Beta hCG would be recommended.  If the patient has a quantitative Beta hCG of greater than [DL], we would expect to see evidence for an intrauterine gestation and findings would be worrisome for a sonographically silent ectopic pregnancy.  Today?s scan findings could correlate with a nongravid uterus, complete spontaneous abortion or sonographically silent ectopic gestation.   Very early pregnancy is felt unlikely given the thinness of the endometrial canal.  Depending on today?s quantitative Beta hCG level, follow-up with serial quantitative Beta hCG may be necessary to differentiate between these diagnostic possibilities.  
 Because of today?s findings, today?s study results were called to Dr. ALMANZAR?[REDACTED] and the patient was referred to triage for quantitative Beta hCG evaluation.

## 2007-06-19 DIAGNOSIS — T148XXA Other injury of unspecified body region, initial encounter: Secondary | ICD-10-CM | POA: Insufficient documentation

## 2007-06-22 ENCOUNTER — Ambulatory Visit: Payer: Self-pay | Admitting: Family Medicine

## 2008-05-09 DIAGNOSIS — J069 Acute upper respiratory infection, unspecified: Secondary | ICD-10-CM | POA: Insufficient documentation

## 2008-05-14 ENCOUNTER — Ambulatory Visit: Payer: Self-pay | Admitting: Family Medicine

## 2008-07-22 ENCOUNTER — Ambulatory Visit: Payer: Self-pay | Admitting: Family Medicine

## 2008-07-22 ENCOUNTER — Telehealth: Payer: Self-pay | Admitting: Family Medicine

## 2008-07-22 DIAGNOSIS — T7840XA Allergy, unspecified, initial encounter: Secondary | ICD-10-CM | POA: Insufficient documentation

## 2008-07-23 ENCOUNTER — Emergency Department (HOSPITAL_COMMUNITY): Admission: EM | Admit: 2008-07-23 | Discharge: 2008-07-23 | Payer: Self-pay | Admitting: Emergency Medicine

## 2008-07-23 DIAGNOSIS — R112 Nausea with vomiting, unspecified: Secondary | ICD-10-CM | POA: Insufficient documentation

## 2008-07-24 ENCOUNTER — Ambulatory Visit: Payer: Self-pay | Admitting: Family Medicine

## 2008-07-24 ENCOUNTER — Inpatient Hospital Stay (HOSPITAL_COMMUNITY): Admission: AD | Admit: 2008-07-24 | Discharge: 2008-07-27 | Payer: Self-pay | Admitting: Internal Medicine

## 2008-07-26 IMAGING — CR DG CHEST 2V
3 series · 3 of 3 positions shown · non-contrast
Comparison: [DATE]

CLINICAL DATA: Acute allergic reaction.  Chest pain.  Nausea and
vomiting.

CHEST - 2 VIEW

[w chest pa (1 of 2)]
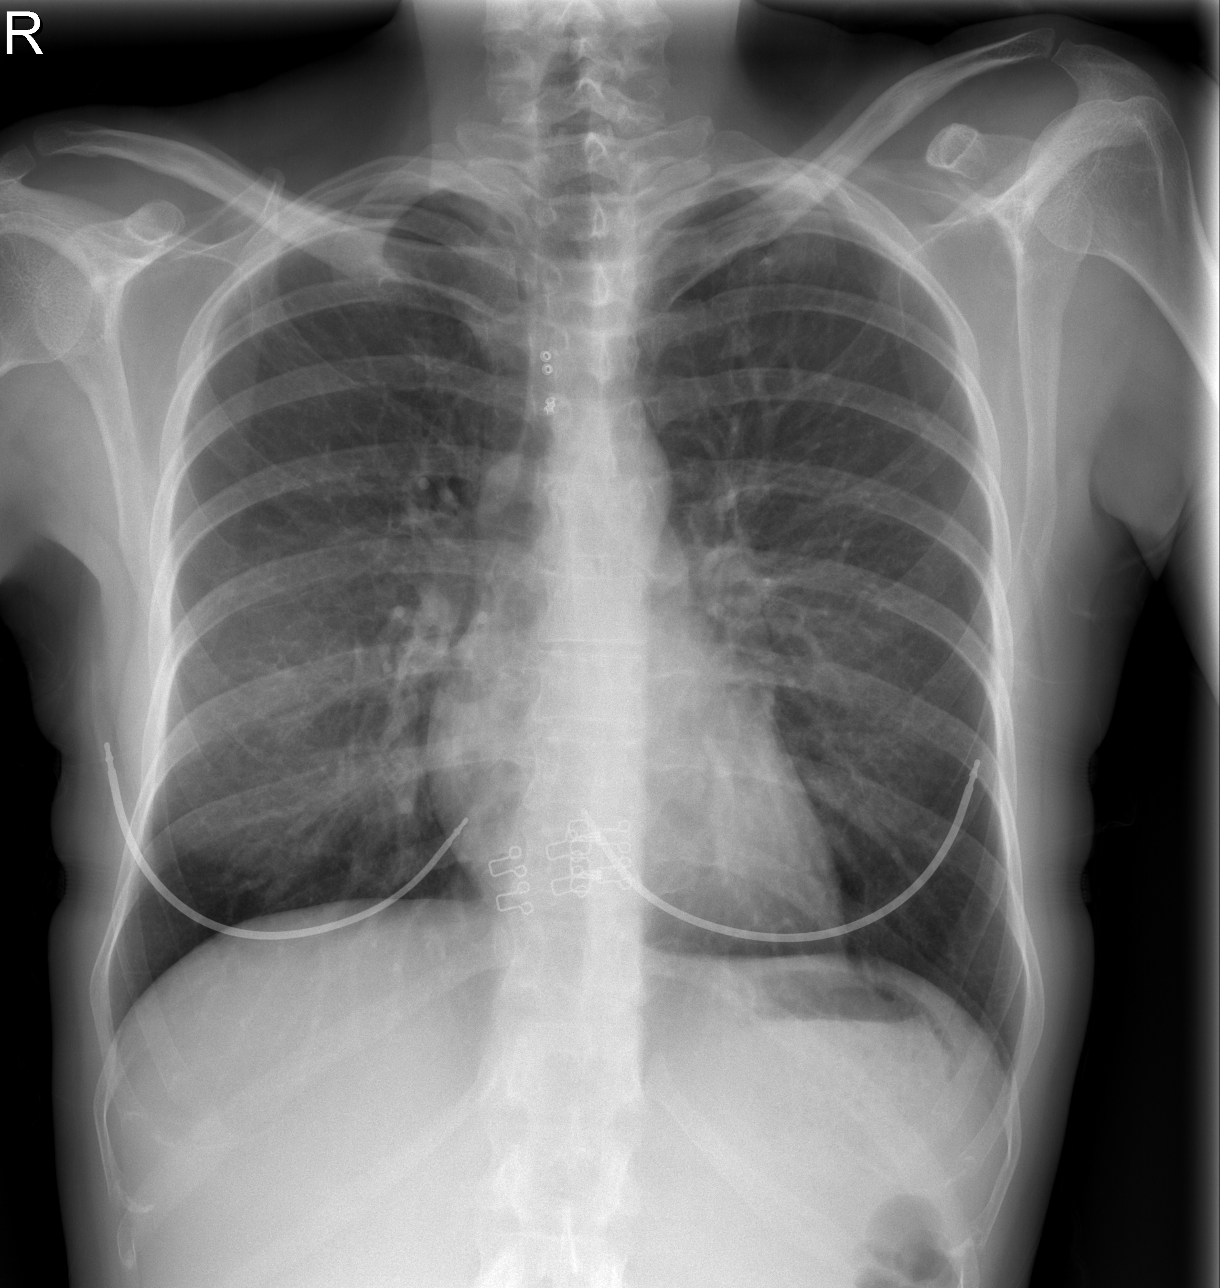

[w chest pa (2 of 2)]
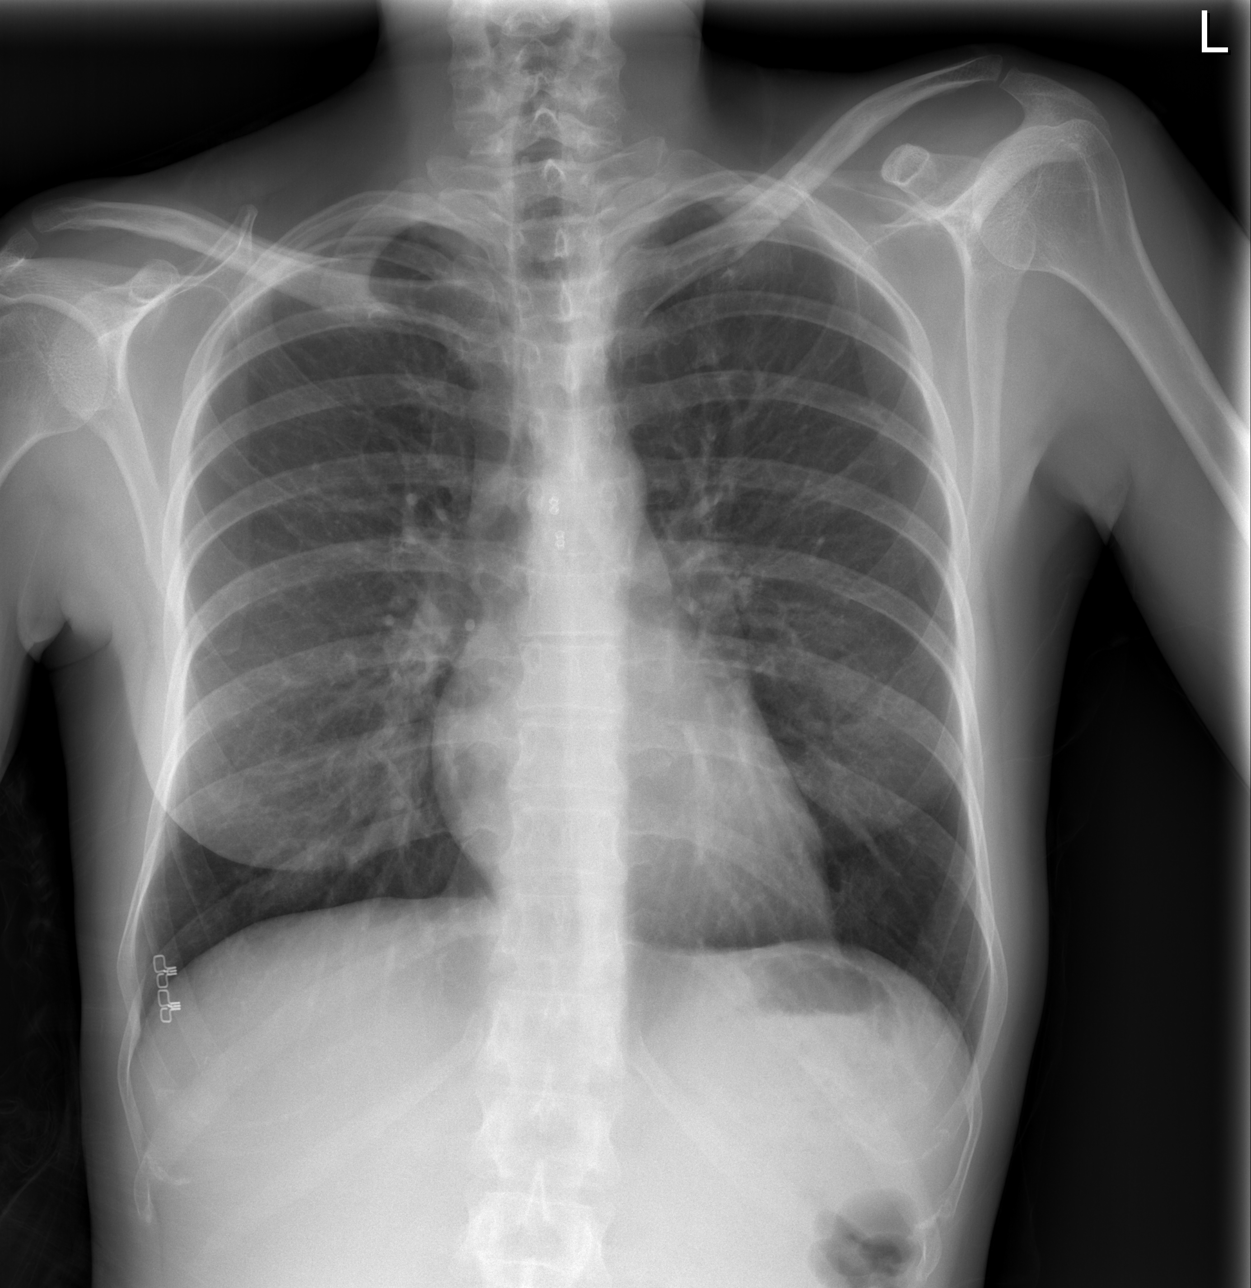

[w chest lat]
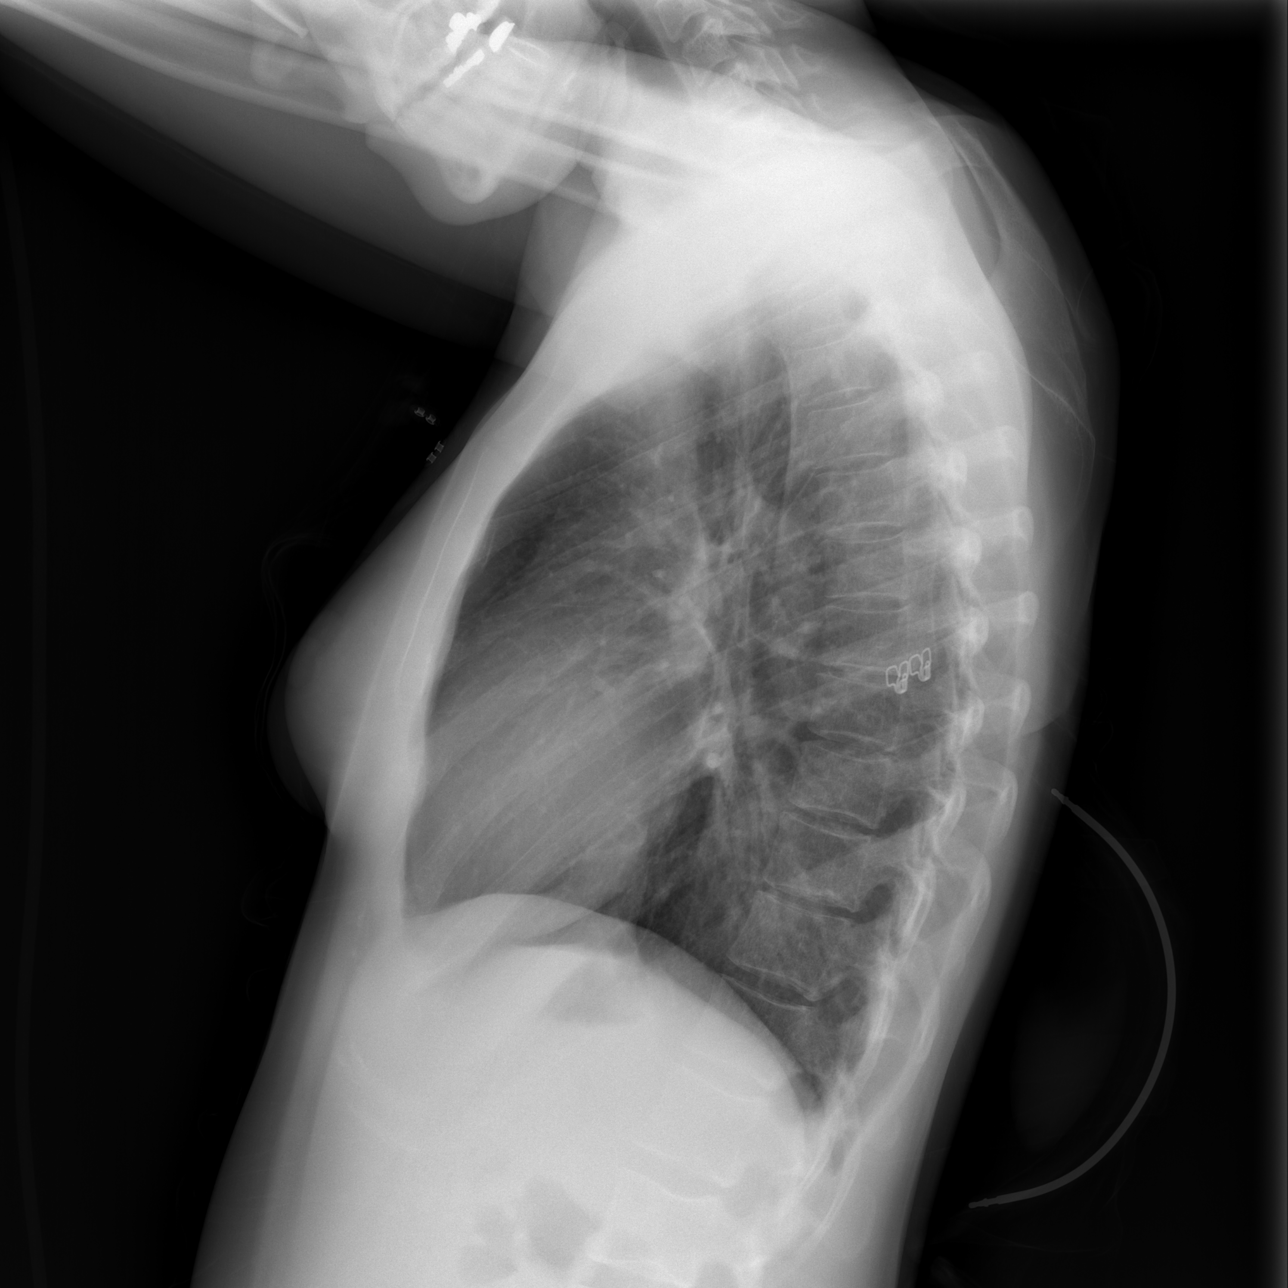

[3 of 3 positions shown; findings below may reference images not displayed]

FINDINGS: Heart size and mediastinal contours are normal.  Both
lungs are clear.  There is no evidence of pleural effusion.  No
mass or adenopathy identified.  No significant change is seen
compared to prior exam.
IMPRESSION: No active cardiopulmonary disease.

## 2008-08-12 ENCOUNTER — Encounter: Payer: Self-pay | Admitting: Family Medicine

## 2008-09-10 ENCOUNTER — Telehealth: Payer: Self-pay | Admitting: *Deleted

## 2008-09-11 ENCOUNTER — Ambulatory Visit: Payer: Self-pay | Admitting: Family Medicine

## 2008-09-11 DIAGNOSIS — J029 Acute pharyngitis, unspecified: Secondary | ICD-10-CM | POA: Insufficient documentation

## 2008-09-12 ENCOUNTER — Encounter: Payer: Self-pay | Admitting: Family Medicine

## 2010-08-26 ENCOUNTER — Inpatient Hospital Stay (HOSPITAL_COMMUNITY)
Admission: AD | Admit: 2010-08-26 | Discharge: 2010-08-27 | Payer: Self-pay | Source: Home / Self Care | Attending: Obstetrics and Gynecology | Admitting: Obstetrics and Gynecology

## 2010-10-05 NOTE — Assessment & Plan Note (Signed)
Summary: coughing,headache/jls   Vital Signs:  Patient Profile:   40 Years Old Female Weight:      113 pounds Temp:     97.5 degrees F oral Pulse rate:   72 / minute Pulse rhythm:   regular BP sitting:   114 / 60  (left arm) Cuff size:   regular  Vitals Entered By: Kern Reap CMA (May 14, 2008 10:12 AM)                 Chief Complaint:  Cough.  History of Present Illness: Madison Savage is a 40 year old, married female, nonsmoker, who comes in today with a 5-day history of a congestion, runny nose, and cough.  She's had no fever, chills, or sputum production.  Otherwise, been well.  Review of systems negative    Current Allergies (reviewed today): ! PCN * CODIENE  Past Medical History:    Reviewed history and no changes required:       childbirth x 2       migraine headaches   Social History:    Reviewed history and no changes required:       Occupation:       Married       Never Smoked       Alcohol use-no       Drug use-no       Regular exercise-yes   Risk Factors:  Tobacco use:  never Drug use:  no Alcohol use:  no Exercise:  yes   Review of Systems      See HPI   Physical Exam  General:     Well-developed,well-nourished,in no acute distress; alert,appropriate and cooperative throughout examination Head:     Normocephalic and atraumatic without obvious abnormalities. No apparent alopecia or balding. Eyes:     No corneal or conjunctival inflammation noted. EOMI. Perrla. Funduscopic exam benign, without hemorrhages, exudates or papilledema. Vision grossly normal. Ears:     External ear exam shows no significant lesions or deformities.  Otoscopic examination reveals clear canals, tympanic membranes are intact bilaterally without bulging, retraction, inflammation or discharge. Hearing is grossly normal bilaterally. Nose:     External nasal examination shows no deformity or inflammation. Nasal mucosa are pink and moist without lesions or  exudates. Mouth:     Oral mucosa and oropharynx without lesions or exudates.  Teeth in good repair. Neck:     No deformities, masses, or tenderness noted. Chest Wall:     No deformities, masses, or tenderness noted. Lungs:     Normal respiratory effort, chest expands symmetrically. Lungs are clear to auscultation, no crackles or wheezes.    Impression & Recommendations:  Problem # 1:  WOUND OPEN, SITE NOS W/O COMPLICATION (ICD-879.8) Assessment: New  Complete Medication List: 1)  Topamax 100 Mg Tabs (Topiramate) .Marland Kitchen.. 1 once daily 2)  Hydromet 5-1.5 Mg/36ml Syrp (Hydrocodone-homatropine) .Marland Kitchen.. 1 or 2 tsps three times a day as needed   Patient Instructions: 1)  Get plenty of rest, drink lots of clear liquids, and use Tylenol or Ibuprofen for fever and comfort. Return in 7-10 days if you're not better:sooner if you're feeling worse. 2)  Take 650-1000mg  of Tylenol every 4-6 hours as needed for relief of pain or comfort of fever AVOID taking more than 4000mg   in a 24 hour period (can cause liver damage in higher doses). 3)  you may take one or 2 teaspoons of the Hydromet up to 3 times a day as needed for your cough and cold  symptoms.  Return p.r.n.   Prescriptions: HYDROMET 5-1.5 MG/5ML SYRP (HYDROCODONE-HOMATROPINE) 1 or 2 tsps three times a day as needed  #8oz x 1   Entered and Authorized by:   Roderick Pee MD   Signed by:   Roderick Pee MD on 05/14/2008   Method used:   Print then Give to Patient   RxID:   [id number]  ]

## 2010-10-05 NOTE — Progress Notes (Signed)
Summary: allergic reaction?  Phone Note Call from Patient   Caller: friend? Call For: Dr Tawanna Cooler Summary of Call: Friend of pt called stating that she took pt to Urgent Care today with allergic reaction to an antibiotic that was given for dental procedure.  Was given steroid injection, and antihistamines.  She is worse.....Marland Kitchenrash is worse.....Marland Kitcheneyes are more swollen and complaining of epigastric pain.  Advised that she needs to be transported to the ER ASAP.   Initial call taken by: Lynann Beaver CMA,  July 22, 2008 3:08 PM  Follow-up for Phone Call        call patient okay to come to the office Follow-up by: Roderick Pee MD,  July 22, 2008 3:15 PM  Additional Follow-up for Phone Call Additional follow up Details #1::        Pt's  husband advised to have pt come to the office. Additional Follow-up by: Lynann Beaver CMA,  July 22, 2008 3:23 PM

## 2010-10-05 NOTE — Miscellaneous (Signed)
Summary: allergy update  Clinical Lists Changes  Allergies: Added new allergy or adverse reaction of CLINDAMYCIN

## 2010-10-05 NOTE — Assessment & Plan Note (Signed)
Summary: NEEDS TETNUS/MHF   Vital Signs:  Patient Profile:   40 Years Old Female Weight:      115 pounds (52.27 kg) Temp:     98.1 degrees F (36.72 degrees C) oral BP sitting:   116 / 54  (right arm)  Pt. in pain?   no  Vitals Entered By: Arcola Jansky, RN (June 22, 2007 4:30 PM)                  Chief Complaint:  wed " was in the attic and a nail hit my back".  History of Present Illness: Madison Savage is a 40 year old female, G2, P2, who comes in today for evaluation of puncture wound from a nail in her attic.  This occurred two days ago.  It's superficial however, she went sure when she got her last tetanus.  She would like that.  Updated.  She also like a flu shot.  Her husband Madison Savage would also like his Nizoral cream and his triamcinolone Fuson renewed.  This will be called into the right aid at [facility].  Current Allergies: ! PCN * CODIENE      Physical Exam  General:     Well-developed,well-nourished,in no acute distress; alert,appropriate and cooperative throughout examination Skin:     there is a superficial abrasion on the back around T8 distal left the midline above the bra.  It is superficial and healing well    Impression & Recommendations:  Problem # 1:  WOUND OPEN, SITE NOS W/O COMPLICATION (ICD-879.8) Assessment: New  Complete Medication List: 1)  [place] Tabs (Prenatal vit-fe cbn-fe sulf-fa) 2)  Topamax 100 Mg Tabs (Topiramate) .Marland Kitchen.. 1 once daily  Other Orders: Tetanus Toxoid w/Dx (16109) Admin 1st Vaccine (60454) Flu Vaccine 62yrs + (09811) Admin of Any Addtl Vaccine (91478)   Patient Instructions: 1)  the nail, abrasion area looks okay.  Will give you a tetanus booster.  There is no special wound care that you need to do.  The area is scabbed over and there is no evidence of secondary infection.  Return p.r.n.Marland Kitchen 2)  Madison Savage and the triamcinolone was used and will be called in to the right aid [facility] for  him.    ]  Tetanus/Td Immunization History:    Tetanus/Td # 1:  Td (06/22/2007)  Tetanus/Td Vaccine    Vaccine Type: Td    Site: right deltoid    Dose: 0.5 ml    Route: IM    Given by: Arcola Jansky, RN    Lot #: [id number]    VIS given: 03/16/05 version given June 22, 2007.  Influenza Vaccine    Vaccine Type: Fluvax 3+    Given by: Arcola Jansky, RN    VIS given: 03/04/05 version given June 22, 2007.  Flu Vaccine Consent Questions    Do you have a history of severe allergic reactions to this vaccine? no    Any prior history of allergic reactions to egg and/or gelatin? no    Do you have a sensitivity to the preservative Thimersol? no    Do you have a past history of Guillan-Barre Syndrome? no    Do you currently have an acute febrile illness? no    Have you ever had a severe reaction to latex? no    Vaccine information given and explained to patient? yes    Are you currently pregnant? no

## 2010-10-05 NOTE — Progress Notes (Signed)
Summary: sore throat  Phone Note Call from Patient   Caller: Patient Call For: Dr Tawanna Cooler Summary of Call: Pt has 2 children at home with strept throat and chicken pox, and has a sore throat.   Would like an antibiotic. Severe sore throat. Allergic to penicillin, Clindamycin (serum  sickness (hospitalization) [id number] CANNOT come in the office due to child care. Initial call taken by: Lynann Beaver CMA,  September 10, 2008 10:37 AM  Follow-up for Phone Call        can not call in medication without seeing the patient Follow-up by: Roderick Pee MD,  September 10, 2008 11:12 AM  Additional Follow-up for Phone Call Additional follow up Details #1::        pt is aware will call back for an appointment.  ok to work in whenever. Additional Follow-up by: Kern Reap CMA,  September 10, 2008 11:23 AM

## 2010-10-05 NOTE — Assessment & Plan Note (Signed)
Summary: allergic reaction per Dr. Todd/dm   Vital Signs:  Patient Profile:   40 Years Old Female Temp:     97.9 degrees F oral Pulse rate:   60 / minute Pulse rhythm:   regular BP sitting:   100 / 70  (left arm) Cuff size:   regular  Vitals Entered By: Kern Reap CMA (July 22, 2008 4:13 PM)                 Chief Complaint:  allergic reaction.  History of Present Illness: Madison Savage is a 40 year old female, who comes in today for evaluation of allergic reaction to clindamycin.  A week ago, her dentist gave her clindamycin.  At 2 a.m. this morning she woke up with severe itching.  She went to an urgent care where she was given Benadryl and a shot of cortisone.  She went home and the rash got worse.  She therefore came to the office for reevaluation.  She's had no airway problems.  She has had some abdominal cramping, but no diarrhea.    Current Allergies: ! PCN * CODIENE  Past Medical History:    Reviewed history from 05/14/2008 and no changes required:       childbirth x 2       migraine headaches   Social History:    Reviewed history from 05/14/2008 and no changes required:       Occupation:       Married       Never Smoked       Alcohol use-no       Drug use-no       Regular exercise-yes    Review of Systems      See HPI   Physical Exam  General:     Well-developed,well-nourished,in no acute distress; alert,appropriate and cooperative throughout examination Head:     Normocephalic and atraumatic without obvious abnormalities. No apparent alopecia or balding. Eyes:     No corneal or conjunctival inflammation noted. EOMI. Perrla. Funduscopic exam benign, without hemorrhages, exudates or papilledema. Vision grossly normal. Ears:     External ear exam shows no significant lesions or deformities.  Otoscopic examination reveals clear canals, tympanic membranes are intact bilaterally without bulging, retraction, inflammation or discharge. Hearing is  grossly normal bilaterally. Nose:     External nasal examination shows no deformity or inflammation. Nasal mucosa are pink and moist without lesions or exudates. Mouth:     Oral mucosa and oropharynx without lesions or exudates.  Teeth in good repair. Neck:     No deformities, masses, or tenderness noted. Skin:     total body hives.  Airway normal    Impression & Recommendations:  Problem # 1:  ALLERGIC REACTION, ACUTE (ICD-995.3) Assessment: New  Orders: Admin of Therapeutic Inj  intramuscular or subcutaneous (16109) Epinephrine 1ml (J0170) Admin of Therapeutic Inj  intramuscular or subcutaneous (60454) Benadryl  IM or IV (J1200) Admin of Therapeutic Inj  intramuscular or subcutaneous (09811)   Complete Medication List: 1)  Topamax 100 Mg Tabs (Topiramate) .Marland Kitchen.. 1 once daily 2)  Hydromet 5-1.5 Mg/34ml Syrp (Hydrocodone-homatropine) .Marland Kitchen.. 1 or 2 tsps three times a day as needed 3)  Prednisone 20 Mg Tabs (Prednisone) .... Uad   Patient Instructions: 1)  begin prednisone, take two tablets now two, at 9 p.m. tonight, then, 3 tablets every morning for 3 days, two for 3 days, one for 3 days, a half a tablet for 3 days, then half a tablet Monday, Wednesday, Friday,  for a two-week taper. 2)  He may also take Claritin, 10 mg twice a day or the Zyrtec 10 mg twice a day or Benadryl 25 to 50 mg 3 times a day as needed for itching. 3)  return on Thursday for follow-up   Prescriptions: PREDNISONE 20 MG TABS (PREDNISONE) UAD  #50 x 1   Entered and Authorized by:   Roderick Pee MD   Signed by:   Roderick Pee MD on 07/22/2008   Method used:   Print then Give to Patient   RxID:   [id number]  ]  Medication Administration  Injection # 1:    Medication: Epinephrine 1ml    Diagnosis: ALLERGIC REACTION, ACUTE (ICD-995.3)    Route: SQ    Site: R deltoid    Exp Date: 06/05/2009    Lot #: [id number]    Mfr: ims limited    Comments: 0.15 given only    Patient tolerated injection  without complications    Given by: Kern Reap CMA (July 22, 2008 5:10 PM)  Injection # 3:    Medication: Benadryl  IM or IV    Diagnosis: ALLERGIC REACTION, ACUTE (ICD-995.3)    Route: IM    Site: R deltoid    Exp Date: 02/04/2000    Lot #: [id number]    Mfr: [employer]    Comments: 0.5 mg/ml given    Patient tolerated injection without complications    Given by: Kern Reap CMA (July 22, 2008 5:12 PM)  Orders Added: 1)  Est. Patient Level IV [65784] 2)  Admin of Therapeutic Inj  intramuscular or subcutaneous [96372] 3)  Epinephrine 1ml [J0170] 4)  Admin of Therapeutic Inj  intramuscular or subcutaneous [96372] 5)  Benadryl  IM or IV [J1200] 6)  Admin of Therapeutic Inj  intramuscular or subcutaneous [69629]

## 2010-10-05 NOTE — Assessment & Plan Note (Signed)
Summary: strep throat/mhf   Vital Signs:  Patient Profile:   40 Years Old Female Weight:      119 pounds Temp:     98.0 degrees F oral BP sitting:   98 / 64  (left arm) Cuff size:   regular  Vitals Entered By: Kern Reap CMA (September 11, 2008 9:31 AM)                 Chief Complaint:  strep throat.    Updated Prior Medication List: TOPAMAX 100 MG  TABS (TOPIRAMATE) 1 once daily  Current Allergies (reviewed today): ! PCN ! CLINDAMYCIN * CODIENE        Complete Medication List: 1)  Topamax 100 Mg Tabs (Topiramate) .Marland Kitchen.. 1 once daily    ]

## 2010-10-05 NOTE — Assessment & Plan Note (Signed)
Summary: fup per dr//ccm   Vital Signs:  Patient Profile:   40 Years Old Female Weight:      119 pounds Temp:     98.1 degrees F oral Pulse rate:   80 / minute Pulse rhythm:   regular BP sitting:   110 / 70  (left arm) Cuff size:   regular  Vitals Entered By: Kern Reap CMA (July 24, 2008 9:52 AM)                 Chief Complaint:  follow up allergic reaction.  History of Present Illness: Madison Savage is a 39 year old, married female, G2, P2, who comes in today for reevaluation of hives, and serum sickness.  11 days ago.  She saw her dentist, who performed an oral surgical procedure and gave her clindamycin.  She's had a history of allergic reaction to penicillin and Keflex.  Seven days after beginning the clindamycin.  She awoke at two o'clock in the morning.  This past Monday with hives.  She went the emergency room and was treated and sent home.  That day.  She came to the office because the eyes got worse.  We gave her epinephrine, .15, subcutaneous with relief of 95% of her lesions.  She then went home on oral prednisone 60 mg a day.  Yesterday she woke up in the middle of the night with hives are worse with vacuum her chair and was given IV Solu-Medrol, Pepcid.  She went home and now comes in today stating the hives are mostly gone that the itching is intense.  Her face is swollen.  She is having trouble swallowing, and in the last 12 hours.  Should she develop nausea, vomiting, and diarrhea.  Her period ended 3 days ago.  She is not on birth control.  She was given .15 of epi subcutaneous however, because of the severe swelling and generalized itching, nausea, vomiting, and inability to keep her medication down.  He admitted to the hospital for rehydration and IV steroids.    Current Allergies: ! PCN * CODIENE  Past Medical History:    Reviewed history from 05/14/2008 and no changes required:       childbirth x 2       migraine headaches   Family History:  Reviewed history and no changes required:  Social History:    Reviewed history from 05/14/2008 and no changes required:       Occupation:       Married       Never Smoked       Alcohol use-no       Drug use-no       Regular exercise-yes    Review of Systems      See HPI   Physical Exam  General:     well-developed well-nourished, female, marked facial edema, complaining of severe muscle and joint pain Head:     Normocephalic and atraumatic without obvious abnormalities. No apparent alopecia or balding. Eyes:     No corneal or conjunctival inflammation noted. EOMI. Perrla. Funduscopic exam benign, without hemorrhages, exudates or papilledema. Vision grossly normal. Ears:     External ear exam shows no significant lesions or deformities.  Otoscopic examination reveals clear canals, tympanic membranes are intact bilaterally without bulging, retraction, inflammation or discharge. Hearing is grossly normal bilaterally. Nose:     External nasal examination shows no deformity or inflammation. Nasal mucosa are pink and moist without lesions or exudates. Mouth:     Oral mucosa  and oropharynx without lesions or exudates.  Teeth in good repair. Neck:     No deformities, masses, or tenderness noted. Chest Wall:     No deformities, masses, or tenderness noted. Lungs:     Normal respiratory effort, chest expands symmetrically. Lungs are clear to auscultation, no crackles or wheezes. Heart:     Normal rate and regular rhythm. S1 and S2 normal without gallop, murmur, click, rub or other extra sounds. Abdomen:     Bowel sounds positive,abdomen soft and non-tender without masses, organomegaly or hernias noted. Msk:     No deformity or scoliosis noted of thoracic or lumbar spine.   Pulses:     R and L carotid,radial,femoral,dorsalis pedis and posterior tibial pulses are full and equal bilaterally Extremities:     No clubbing, cyanosis, edema, or deformity noted with normal full range of  motion of all joints.   Neurologic:     No cranial nerve deficits noted. Station and gait are normal. Plantar reflexes are down-going bilaterally. DTRs are symmetrical throughout. Sensory, motor and coordinative functions appear intact. Skin:     diffuse edema of all her joints.  The hives have mostly dissipated with the epinephrine Cervical Nodes:     No lymphadenopathy noted Axillary Nodes:     No palpable lymphadenopathy Inguinal Nodes:     No significant adenopathy Psych:     Cognition and judgment appear intact. Alert and cooperative with normal attention span and concentration. No apparent delusions, illusions, hallucinations    Impression & Recommendations:  Problem # 1:  NAUSEA WITH VOMITING (ICD-787.01) Assessment: New  Orders: No Charge Patient Arrived (NCPA0) (NCPA0)   Problem # 2:  ALLERGIC REACTION, ACUTE (ICD-995.3) Assessment: Deteriorated  Orders: Admin of Therapeutic Inj  intramuscular or subcutaneous (16109) Epinephrine 1ml (J0170) No Charge Patient Arrived (NCPA0) (NCPA0)   Complete Medication List: 1)  Topamax 100 Mg Tabs (Topiramate) .Marland Kitchen.. 1 once daily 2)  Hydromet 5-1.5 Mg/21ml Syrp (Hydrocodone-homatropine) .Marland Kitchen.. 1 or 2 tsps three times a day as needed 3)  Prednisone 20 Mg Tabs (Prednisone) .... Uad   Patient Instructions: 1)  go directly to [facility] long hospital admit his office for admission.   ]  Medication Administration  Injection # 1:    Medication: Epinephrine 1ml    Diagnosis: ALLERGIC REACTION, ACUTE (ICD-995.3)    Route: SQ    Site: R deltoid    Exp Date: 06/05/2009    Lot #: [id number]    Mfr: ims limited    Comments: only 0.15 ml given    Patient tolerated injection without complications    Given by: Kern Reap CMA (July 24, 2008 11:02 AM)  Orders Added: 1)  Admin of Therapeutic Inj  intramuscular or subcutaneous [96372] 2)  Epinephrine 1ml [J0170] 3)  No Charge Patient Arrived (NCPA0) [NCPA0]

## 2010-11-15 LAB — CBC
HCT: 32.8 % — ABNORMAL LOW (ref 36.0–46.0)
HCT: 38.4 % (ref 36.0–46.0)
Hemoglobin: 10.8 g/dL — ABNORMAL LOW (ref 12.0–15.0)
Hemoglobin: 13.1 g/dL (ref 12.0–15.0)
MCH: 30.3 pg (ref 26.0–34.0)
MCH: 30.9 pg (ref 26.0–34.0)
MCHC: 32.9 g/dL (ref 30.0–36.0)
MCHC: 34.1 g/dL (ref 30.0–36.0)
MCV: 90.6 fL (ref 78.0–100.0)
MCV: 91.9 fL (ref 78.0–100.0)
Platelets: 122 10*3/uL — ABNORMAL LOW (ref 150–400)
Platelets: 149 10*3/uL — ABNORMAL LOW (ref 150–400)
RBC: 3.57 MIL/uL — ABNORMAL LOW (ref 3.87–5.11)
RBC: 4.24 MIL/uL (ref 3.87–5.11)
RDW: 13.5 % (ref 11.5–15.5)
RDW: 13.7 % (ref 11.5–15.5)
WBC: 11.5 10*3/uL — ABNORMAL HIGH (ref 4.0–10.5)
WBC: 12 10*3/uL — ABNORMAL HIGH (ref 4.0–10.5)

## 2010-11-15 LAB — RPR: RPR Ser Ql: NONREACTIVE

## 2011-01-18 NOTE — Discharge Summary (Signed)
[name], Madison Savage NO.:  [id number]   MEDICAL RECORD NO.:  [id number]          PATIENT TYPE:  INP   LOCATION:  1307                         FACILITY:  [facility]   PHYSICIAN:  Corwin Levins, MD      DATE OF BIRTH:  1971-06-15   DATE OF ADMISSION:  07/24/2008  DATE OF DISCHARGE:  07/27/2008                               DISCHARGE SUMMARY   DISCHARGE DIAGNOSES:  1. Serum sickness with multiple manifestations, including angioedema      of the face and oropharyngeal area with nausea and vomiting,      diarrhea, rash, and fever.  2. Thrush.  3. History of migraines.   PROCEDURES:  None.   CONSULTS:  None.   HISTORY AND PHYSICAL:  See that on the EMR at [facility], date  of admission.   HOSPITAL COURSE:  [name] is a 40 year old white female who was  exposed to penicillin and clindamycin for some time prior to admission  and developed hives 4 days prior to admission.  She was treated with  prednisone in tapering doses, but failed this treatment and developed  serum sickness with rather severe symptoms.  The patient developed  oropharyngeal swelling and irritation, developing diarrhea, rash, and  fever, as well as rather severe joint pain.  She required admission for  IV steroid treatment, antihistamine, IV fluids, and p.r.n. medications  for nausea.  She was also given nystatin for what appeared to be thrush.  Over her hospitalization, she improved quite nicely and seemed to  respond to initial morphine and changed to oxycodone.  There was some  chest tightness, although oxygenation was normal.  Chest x-ray normal.  There were some decreased breath sounds and it was felt she might  benefit from some temporary inhaler use.  By the time of discharge, the  thrush appeared to be improved as well.  She did respond symptomatically  to the Benadryl on hospital course as well for itching.  She was  ambulatory with marked improvement of the ENT symptoms and  had  resolution of her GI symptoms, and was felt to have gained maximum  benefit from this hospitalization.  It is fully expected that the rash  and arthralgias will improve over the next 1 to 2 weeks.   DISPOSITION:  Discharged to home in good condition.  She is instructed  under no circumstances to every use CLINDAMYCIN- or PENICILLIN-related  medications in the future.  She is to follow up with Dr. Tawanna Cooler in 2  weeks.  She is to follow a regular diet.  She is discharged to home in  good condition.   DISCHARGE MEDICATIONS:  1. Topamax 100 mg p.o. daily.  2. Oxycodone 5 mg 1 to 2 q.6 hours p.r.n.  3. Zyrtec over-the-counter 10 mg p.o. daily.  4. Prednisone taper starting at 40 mg tapering off over 2 weeks.  5. ProAir HFA p.r.n.  6. Nystatin to complete a 10-day course.      Corwin Levins, MD  Electronically Signed     JWJ/MEDQ  D:  07/27/2008  T:  07/27/2008  Job:  [id number]   cc:   Eugenio Hoes. Tawanna Cooler, MD  [address] [place]  [place] [address]

## 2011-01-21 NOTE — Assessment & Plan Note (Signed)
[facility]                                 ON-CALL NOTE   NAME:Madison Savage, Madison Savage                       MRN:          [id number]  DATE:10/21/2006                            DOB:          1971/08/15    PRIMARY CARE PHYSICIAN:  Dr. Tawanna Cooler.   PHONE:  [phone].   SUBJECTIVE:  Trouble breathing, sore throat, cough, chest sore, no overt  shortness of breath and speaking in full sentences.   ASSESSMENT AND PLAN:  To Urgent Care.     Kerby Nora, MD  Electronically Signed    AB/MedQ  DD: 10/22/2006  DT: 10/22/2006  Job #: [id number]

## 2011-01-21 NOTE — Discharge Summary (Signed)
NAME:  Madison Savage, Madison Savage                        ACCOUNT NO.:  [id number]   MEDICAL RECORD NO.:  [id number]                   PATIENT TYPE:  INP   LOCATION:  9153                                 [facility]ACILITY:  [facility]   PHYSICIAN:  Janine Limbo, M.D.            DATE OF BIRTH:  06/05/1971   DATE OF ADMISSION:  11/14/2003  DATE OF DISCHARGE:  11/17/2003                                 DISCHARGE SUMMARY   ADMISSION DIAGNOSES:  1. Intrauterine pregnancy at 33-1/7 weeks.  2. Oligohydramnios.   DISCHARGE DIAGNOSES:  1. Intrauterine pregnancy at 33-4/7 weeks.  2. Resolved oligohydramnios.   PROCEDURE:  None.   HOSPITAL COURSE:  Madison Savage is a 40 year old, gravida 3, para 1-0-1-1, at  33-1/7 weeks who was admitted on November 14, 2003, following an amniotic fluid  index in the office of 6.4. She had had an ultrasound on March 9th and was  noted to have an AFI of 7.5 with normal growth. The patient was sent home on  bedrest at that time. She returned on the 11th for follow-up ultrasound  which showed the lessening of her amniotic fluid. She did have biophysical  profile of 8/8. She denied any leaking or bleeding. Her pregnancy being  remarkable for: 1) history of normal LMP; 2) history of positive group B  strep with previous pregnancy; 3) history of bipolar disorder and postpartum  depression, but stable this pregnancy; 4) history of migraines; 5) history  of preterm labor with fulltime delivery on her previous pregnancies. On  admission she was noted to have reactive fetal heart rate tracing. Her  physical exam was within normal limits.  She was having minimal  contractions, she was placed on IV fluids. PIH labs were done which were  negative and a plan was made to repeat her ultrasound on November 16, 2003.  Through the rest of the weekend she continued to do well, fetal heart rate  remained reactive, and intake and output were in reasonable balance. On the  morning of November 17, 2003, she had  repeat ultrasound showing normal fluid of  12.7 cm, cervix was 4.5 cm with a persistent lower uterine segment  contraction. Fetus was in the oblique transverse position with the head on  maternal right. Fetal heart rate was reactive, very occasionally  contractions were noted.   Dr. Stefano Gaul was consulted and decision was made to discharge the patient  home.   DISCHARGE INSTRUCTIONS:  The patient is to continue on increasing her rest.  She is also to increase her fluid intake to 3-4 liters per day at least. She  is to observe for any signs or symptoms of premature labor, decreased fetal  movement, or any other problems. She is to monitor fetal kick counts three  times a day.   DISCHARGE MEDICATIONS:  Prenatal vitamin one p.o. daily.   DISCHARGE FOLLOWUP:  In one week at [facility] with a return OB  visit and a repeat ultrasound for amniotic fluid index.    Renaldo Reel Emilee Hero, C.N.M.                   Janine Limbo, M.D.   VLL/MEDQ  D:  11/17/2003  T:  11/18/2003  Job:  [id number]

## 2011-01-21 NOTE — H&P (Signed)
NAME:  Madison Savage, Madison Savage                        ACCOUNT NO.:  [id number]   MEDICAL RECORD NO.:  [id number]                   PATIENT TYPE:  INP   LOCATION:  9153                                 FACILITY:  [facility]   PHYSICIAN:  Naima A. Dillard, M.D.              DATE OF BIRTH:  Feb 19, 1971   DATE OF ADMISSION:  11/14/2003  DATE OF DISCHARGE:                                HISTORY & PHYSICAL   HISTORY OF PRESENT ILLNESS:  Madison Savage is a 40 year old married black  female gravida 3 para 1-0-1-1 at 75 and one-seventh weeks who is admitted  today for observation and IV fluids secondary to oligohydramnios.  She had  an ultrasound on November 12, 2003 and was noted to have an AFI at that time of  7.5 cm with normal growth and the patient was sent home on bedrest and  increased p.o. fluids, and returned today for follow-up AFI which is 6.4 cm.  She does have a biophysical profile score of 8/10 today and denies any  leaking or vaginal bleeding.  She denies any uterine contractions.  She  denies any nausea, vomiting, headaches or visual disturbances.  Her  pregnancy has been followed at [facility] by the M.D. service  and has been essentially uncomplicated though at risk for history of  abnormal LMP, history of group B strep with her previous pregnancy, history  of bipolar disorder and postpartum depression but stable throughout this  pregnancy, history of migraines, and history of preterm labor with full-term  delivery with her previous pregnancy.  She also has had some preterm uterine  contractions with this pregnancy and had an evaluation on March 8 and fetal  fibronectin was negative.   OBSTETRICAL AND GYNECOLOGICAL HISTORY:  She is a gravida 3 para 1-0-1-1 who  had an elective AB in 1992 with complications of fever and heavy bleeding.  In December 2000 she delivered a viable female infant who weighed 7 pounds 6  ounces at [redacted] weeks gestation following a 12-hour labor.  She did have an  epidural and was attended in delivery by Dr. Leonard Schwartz, and the  baby's name was Jaxon.  With that pregnancy she did have preterm uterine  contractions but got to full term.  Other GYN history is essentially  noncontributory.   GENERAL MEDICAL HISTORY:  She is allergic to PENICILLIN - it gives her  swelling in her throat and ERYTHROMYCIN gives her nausea and vomiting.  She  also is allergic to FETA CHEESE and ALFALFA SPROUTS.  She reports having had  the usual childhood diseases, occasional urinary tract infection, some kind  of irritable bowel syndrome perhaps, and migraine headaches and cluster  headaches occasionally, and a history of postpartum depression and bipolar  disorder prior to this pregnancy.  Wisdom teeth removal is her only surgery  and only hospitalization was for depression when she was 16 and pneumonia  when she  was 10, and she did have drug overdose attempt in college for which  she was hospitalized.   FAMILY HISTORY:  Significant for maternal aunt with heart transplant and  thyroid disease.  Paternal grandmother with breast cancer, paternal  grandfather with throat cancer, and paternal aunt with leukemia.   GENETIC HISTORY:  Negative.   SOCIAL HISTORY:  She is married to Candie Gintz who is involved and  supportive.  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  They deny any illicit drug use, alcohol, or smoking with this pregnancy.   PRENATAL LABORATORY DATA:  Her blood type is A positive, antibody screen is  negative.  Toxo titers are negative.  Syphilis is nonreactive.  Rubella is  positive.  Hepatitis B surface antigen is negative.  HIV was nonreactive.  Cystic fibrosis was negative.  GC and chlamydia are negative.  Her one-hour  Glucola was within normal range at 128.  Her fetal fibronectin on March 8  was negative.   PHYSICAL EXAMINATION:  VITAL SIGNS:  Stable, blood pressure of 110/60.  HEENT:  Grossly within normal limits   HEART:  Regular rhythm and rate.  CHEST:  Clear.  BREASTS:  Soft and nontender.  ABDOMEN:  Gravid with uterine contractions very irregular and mild.  Her  fetal heart rate is reassuring, 135 by ultrasound and Doppler.  Her fundal  height is 32 cm.  PELVIC:  Exam was deferred though on ultrasound her cervical length was 3.1  cm.  Ultrasound today also shows an AFI of 6.4 cm and an 8/8 score on the  biophysical profile.   ASSESSMENT:  1. Intrauterine pregnancy at 33 and one-seventh weeks.  2. Oligohydramnios with normal growth.   PLAN:  Her plan per consult with Dr. Normand Sloop is to admit to [facility]  for IV fluid hydration and observation.     Concha Pyo. Duplantis, C.N.M.              Naima A. Normand Sloop, M.D.    SJD/MEDQ  D:  11/14/2003  T:  11/14/2003  Job:  [id number]

## 2011-01-21 NOTE — H&P (Signed)
NAME:  Madison Savage, Madison Savage                        ACCOUNT NO.:  [id number]   MEDICAL RECORD NO.:  [id number]                   PATIENT TYPE:  INP   LOCATION:  9166                                 [facility]ACILITY:  [facility]   PHYSICIAN:  Osborn Coho, M.D.                DATE OF BIRTH:  [birth year]   DATE OF ADMISSION:  12/20/2003  DATE OF DISCHARGE:                                HISTORY & PHYSICAL   HISTORY OF PRESENT ILLNESS:  Madison Savage is a 40 year old gravida 3 para 1-0-  1-1 at 34 and two-sevenths weeks who presented with spontaneous rupture of  membranes at approximately 1:45 a.m., clear fluid noted, uterine  contractions every 2-4 minutes, 60 seconds in duration, moderate quality.  Reports positive fetal movement, denies bleeding.  Pregnancy has been  remarkable for:  1. Positive group B strep.  2. History of bipolar disease but no medications at present.  3. Abnormal last menstrual period.  4. History of migraines.  5. Smoker.  6. History of depression and postpartum depression.  7. History of preterm labor with term delivery.   PRENATAL LABORATORY DATA:  Blood type is A positive, Rh antibody negative.  VDRL nonreactive.  Rubella titer positive.  Hepatitis B surface antigen  negative.  HIV nonreactive.  Toxoplasmosis titers were negative on acute and  convalescent titers.  GC and chlamydia cultures were negative.  Cystic  fibrosis testing was negative.  Pap was normal.  Hemoglobin upon entering  the practice was 13.3; it was 12.1 at 28 and six-sevenths weeks.  EDC of  January 01, 2004 was established by last menstrual period and was in agreement  with ultrasound at 7 and 18 weeks.  Group B strep culture was positive at 27  weeks.  AFP was normal.  One-hour Glucola was also normal.   HISTORY OF PRESENT PREGNANCY:  The patient entered care at approximately 10  weeks.  She did have some migraines in early pregnancy.  She was placed on  Demerol for headaches and Vicodin p.r.n.  She had  some constant pressure at  26 weeks. Cervix was long and closed.  Fetal fibronectin was done.  GC,  chlamydia, and GBS were done.  Beta strep was the only positive.  She was  placed on terbutaline at approximately 26 weeks.  Fetal fibronectin was  negative.  The rest of her pregnancy was essentially unremarkable.   OBSTETRICAL HISTORY:  In 1992 she had a 14-week TAB.  She did have fever and  heavy bleeding postoperatively.  In 2000 she had a vaginal birth of a female  infant that weighed 7 pounds 6 ounces at 40 weeks.  She was in labor 12  hours.  She had epidural anesthesia.  She had no complications.  At 26 weeks  during that pregnancy she did have some preterm labor, dilated to 3 cm, and  was on bedrest and terbutaline.  She also had beta strep  with her last  pregnancy.   MEDICAL HISTORY:  She was on oral contraceptives until May 2004 which was  Ortho-Novum 7/7/7.  She was treated for chlamydia while in college.  She was  diagnosed with gastrointestinal difficulties at 40 years old.  She has had  two UTIs.  She has had a history of migraines and cluster headaches.  She  does have a history of being bipolar but has not been on any medication in  quite a while.  She had depression when she was 16 years ago, pneumonia when  she was 10 years ago, drug overdose in college, and the only other  hospitalization was for childbirth.  She has had wisdom teeth removed.   ALLERGIES:  1. PENICILLIN which causes swelling in her throat.  2. CODEINE which causes hallucinations.  3. ERYTHROMYCIN ETHYLSUCCINATE which causes nausea and vomiting.  4. She is also allergic to FETA CHEESE which causes swelling in throat and     ALFALFA SPROUTS which causes her hands to turn red.   FAMILY HISTORY:  Her maternal aunt had a heart transplant but is now  deceased.  Maternal grandmother had breast cancer.  Paternal grandfather had  throat cancer.  Paternal aunt had leukemia.  These are all deceased as well.  Her  parents both have depression.  Her maternal aunt also had issues with  depression and several first cousins are bipolar.  Maternal grandfather,  paternal grandfather, and father all have a history of alcohol abuse.  Her  father also has a history of drug abuse.   GENETIC HISTORY:  Unremarkable.   SOCIAL HISTORY:  The patient is married to the father of the baby.  He is  involved and supportive.  His name is Aliyanah Rozas.  The patient has 2 years  of college; she is a homemaker.  Her husband has 4 years of college; he is  employed at [employer].  She has been followed by the physician service at  [facility].  She denies any alcohol, drug, or tobacco use during  this pregnancy.  She is Caucasian and does not have a declared religious  affiliation.   PHYSICAL EXAMINATION:  VITAL SIGNS:  Stable; the patient is afebrile.  HEENT:  Within normal limits.  LUNGS:  Bilateral breath sounds are clear.  HEART:  Regular rate and rhythm without murmur.  BREASTS:  Soft and nontender.  ABDOMEN:  Fundal height is approximately 38 cm.  Estimated fetal weight is 7  to 7.5 pounds.  Uterine contractions every 2-4 minutes, moderate quality.  PELVIC:  The patient is noted to be leaking clear fluid.  Nitrazine and fern  are positive.  Cervix is 3-4 cm, 75%, vertex at a -1 station with no  membranes felt.  EXTREMITIES:  Deep tendon reflexes are 2+ without clonus.  There is a trace  edema noted.   IMPRESSION:  1. Intrauterine pregnancy at 63 and two-sevenths weeks.  2. Spontaneous rupture of membranes with early labor.  3. Positive group B streptococcus with PENICILLIN allergy.   PLAN:  1. Admit to birthing suite per consult with Dr. Osborn Coho as attending     physician.  2. Routine physician orders.  3. Plan group B strep prophylaxis with clindamycin 900 mg IV q.8h. p.r.n.    Renaldo Reel Emilee Hero, C.N.M.                   Osborn Coho, M.D.    VLL/MEDQ  D:  12/20/2003  T:  12/20/2003   Job:  [id number]

## 2011-01-21 NOTE — H&P (Signed)
[facility]  Patient:    Madison Savage                      MRN: [id number] Adm. Date:  [id number] Attending:  Pleas Koch Dictator:   Maggie Schwalbe, C.N.M.                         History and Physical  DATE OF BIRTH:                06/21/1971  HISTORY OF PRESENT ILLNESS:   This is a 40 year old gravida 2, para 0-0-1-0, at  40-1/7 weeks pregnancy, with reports of strong contractions every two to three minutes for the last three hours.  She presented at 4 cm, 80% effaced, vertex at -1, intact bag of waters.  Positive group beta strep history.  Uncomplicated pregnancy.  This pregnancy was at risk due to conception while on birth control  pills and exposure to pet cats, as well as first trimester bleeding.  She had a  normal ultrasound confirming dates.  PRENATAL LABORATORY DATA:     On entering into the practice, hemoglobin 13.4, hematocrit 36.9, platelets 173.  Blood test and ______  positive, Rh antibody negative.   Toxoplasmosis negative.  VDRL nonreactive.  Rubella titer immune. Hepatitis B surface antigen negative.  Group beta strep negative in September 2000, positive at 36 weeks.  Glucose challenge test within normal limits.  ALLERGIES:                    PENICILLIN, KEFLEX, and CODEINE.  PAST MEDICAL HISTORY:         Usual childhood diseases.  Has indoor cats. Heart murmur diagnosed in childhood, takes no prophylaxis.  Irritable bowel syndrome n 1988, no medication.  UTI in 1995.  Discontinued smoking in March 2000. Diagnosed with pneumonia in 1983.  Migraine headaches for the last 12 years.  FAMILY HISTORY:               Paternal grandfather with lung cancer.  Paternal grandmother with breast cancer.  Paternal aunt with leukemia.  GENETIC HISTORY:              Distant cousin with abnormal growth.  Father of the baby had a brother with a hole in the heart.  Father of the babys mother and great-grandmother were born  with strabismus.  SOCIAL HISTORY:               Stopped smoking in March 2000.  Denies alcohol or  drug abuse.  Caucasian.  Christian religion.  Married to [employer].  Works as an Environmental education officer.  College graduate.  Father of the baby is an Nature conservation officer, Engineer, maintenance (IT).  Stable monogamous relationship. Currently denies smoking, alcohol, or drug abuse.  PHYSICAL EXAMINATION:  HEENT:                        Within normal limits.  LUNGS:                        Bilaterally clear.  HEART:                        Regular rate and rhythm.  ABDOMEN:  Soft and nontender.  Contractions every two to three minutes, lasting 50-60 seconds.  Fetal heart rate is reassuring.  Negative CST.   EXTREMITIES:                  Trace edema.  DTRs +1.  CERVIX:                       At 4 cm, 80% effaced, vertex at -1, intact bag of  waters.  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  Positive group eta strep.  Allergic to penicillin.  Early active labor.  PLAN:                         Admit to labor and delivery.  Plan for M.D. management per patient request.  Notify Dr. Elliot Gault of admission and status. Routine [facility] orders including group beta strep prophylaxis with clindamycin.  Anticipate normal spontaneous vaginal delivery. DD:  08/17/99 TD:  08/17/99 Job: [id number]

## 2011-06-08 LAB — CBC
HCT: 32.1 — ABNORMAL LOW
Hemoglobin: 10.8 — ABNORMAL LOW
MCHC: 33.5
MCV: 91.9
Platelets: 168
RBC: 3.49 — ABNORMAL LOW
RDW: 13.4
WBC: 7.2

## 2011-06-08 LAB — BASIC METABOLIC PANEL
BUN: 5 — ABNORMAL LOW
CO2: 26
Calcium: 8.3 — ABNORMAL LOW
Chloride: 108
Creatinine, Ser: 0.68
GFR calc Af Amer: >60
GFR calc non Af Amer: >60
Glucose, Bld: 175 — ABNORMAL HIGH
Potassium: 4.4
Sodium: 139

## 2012-09-11 ENCOUNTER — Telehealth: Payer: Self-pay | Admitting: Obstetrics and Gynecology

## 2012-09-12 ENCOUNTER — Encounter: Payer: Self-pay | Admitting: Obstetrics and Gynecology

## 2012-09-12 ENCOUNTER — Ambulatory Visit (INDEPENDENT_AMBULATORY_CARE_PROVIDER_SITE_OTHER): Payer: BC Managed Care – PPO | Admitting: Obstetrics and Gynecology

## 2012-09-12 VITALS — BP 92/64 | Wt 120.0 lb

## 2012-09-12 DIAGNOSIS — N949 Unspecified condition associated with female genital organs and menstrual cycle: Secondary | ICD-10-CM

## 2012-09-12 DIAGNOSIS — R102 Pelvic and perineal pain: Secondary | ICD-10-CM

## 2012-09-12 DIAGNOSIS — N926 Irregular menstruation, unspecified: Secondary | ICD-10-CM

## 2012-09-12 LAB — HCG, QUANTITATIVE, PREGNANCY: hCG, Beta Chain, Quant, S: 7.9 m[IU]/mL

## 2012-09-12 NOTE — Progress Notes (Signed)
HISTORY OF PRESENT ILLNESS  [name] is a 42 y.o. year old female,No obstetric history on file., who presents for a problem visit.   Subjective:  She complains of a late menstrual cycle. She is not using contraception and she would like to get pregnant. 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 She has had pain in her pelvis that is similar to a prior episode when she had an ovarian cyst. She has had 2 negative home pregnancy test.  Objective:  BP 92/64  Wt 120 lb (54.432 kg)  LMP 08/06/2012   GI: soft and nontender  External genitalia: normal general appearance Vaginal: normal without tenderness, induration or masses Cervix: normal appearance Adnexa: normal bimanual exam Uterus: normal size, nontender  Assessment:  Irregular menstrual cycles  Pelvic discomfort. No evidence of a cyst on her ovary.  Plan:  Management options reviewed with patient. We will obtain a serum hCG. The patient will call for test results.  Return to office prn if symptoms worsen or fail to improve.  Leonard Schwartz M.D.  09/12/2012 6:16 PM

## 2012-09-13 ENCOUNTER — Telehealth: Payer: Self-pay | Admitting: Obstetrics and Gynecology

## 2012-09-13 NOTE — Telephone Encounter (Signed)
Spoke with pt rgd msg pt want quant results informed pt quant 7.9 AVS has not signed off on result or giving his recommendations yet advised pt will consult with AVS rgd lab results and call her back pt voice understanding

## 2012-09-14 ENCOUNTER — Telehealth: Payer: Self-pay | Admitting: Obstetrics and Gynecology

## 2012-09-14 NOTE — Telephone Encounter (Signed)
Tc to pt per telephone call.  Appt sched 09/26/12 @ 4:00p with lab only for [facility] per AVS. Pt voices understanding .

## 2012-09-15 ENCOUNTER — Telehealth (HOSPITAL_COMMUNITY): Payer: Self-pay | Admitting: Obstetrics and Gynecology

## 2012-09-15 NOTE — Telephone Encounter (Signed)
Pt calls with c/o of pink spotting which began this AM with some cramping as well.  States only with wiping but not enough to get on pad.  Had quant done earlier this week and was notified of results by Dr. Stefano Gaul.  Quant HcG was 7.9.  Pt has appt for repeat labs on 1/22 and pt instructed to follow-up as scheduled.  SAB precautions d/w pt.  Pt verbalizes understanding.

## 2012-09-26 ENCOUNTER — Other Ambulatory Visit: Payer: BC Managed Care – PPO

## 2012-09-26 DIAGNOSIS — N912 Amenorrhea, unspecified: Secondary | ICD-10-CM

## 2012-09-27 ENCOUNTER — Telehealth: Payer: Self-pay | Admitting: Obstetrics and Gynecology

## 2012-09-27 LAB — HCG, QUANTITATIVE, PREGNANCY: hCG, Beta Chain, Quant, S: <2 m[IU]/mL

## 2012-09-27 NOTE — Telephone Encounter (Signed)
Patient called. Told hCG is negative. Patient is grieving appropriately. A positive.  Dr. Stefano Gaul

## 2014-03-10 ENCOUNTER — Ambulatory Visit (INDEPENDENT_AMBULATORY_CARE_PROVIDER_SITE_OTHER): Payer: BC Managed Care – PPO | Admitting: Internal Medicine

## 2014-03-10 ENCOUNTER — Telehealth: Payer: Self-pay | Admitting: Family Medicine

## 2014-03-10 ENCOUNTER — Encounter: Payer: Self-pay | Admitting: Internal Medicine

## 2014-03-10 VITALS — BP 102/70 | HR 89 | Temp 98.7°F | Wt 117.0 lb

## 2014-03-10 DIAGNOSIS — R05 Cough: Secondary | ICD-10-CM

## 2014-03-10 DIAGNOSIS — R059 Cough, unspecified: Secondary | ICD-10-CM | POA: Insufficient documentation

## 2014-03-10 DIAGNOSIS — D693 Immune thrombocytopenic purpura: Secondary | ICD-10-CM

## 2014-03-10 DIAGNOSIS — K209 Esophagitis, unspecified without bleeding: Secondary | ICD-10-CM

## 2014-03-10 MED ORDER — OMEPRAZOLE 40 MG PO CPDR: 40.0000 mg | DELAYED_RELEASE_CAPSULE | Freq: Every day | ORAL | Status: DC

## 2014-03-10 NOTE — Progress Notes (Signed)
   Subjective:    Patient ID: Madison Savage, female    DOB: 11-14-1970, 43 y.o.   MRN: [id number]  HPI  43 year old white female with history of chronic ITP and MTHFR mutation to reestablish. Patient previously followed by Dr. Sherren Mocha.  Patient has 41 multiple complaints. Patient complains of cough for the last one to 2 weeks. Patient not sure whether cough secondary to viral upper respiratory infection that she caught from her 61-year-old son. Patient also reports she was exposed to toxic fumes when she used epoxy paint to restore her her bathroom tub.  She also complains of midepigastric discomfort. She reports no previous history of chronic GERD. She denies any dysphasia. She reports a mild discomfort with swallowing food but not liquids.  Patient has history of recurrent miscarriages. Her workup positive for MTHFR mutation. She currently takes daily folic acid.  She also has history of ITP.  It was discovered during her last pregnancy. Her platelet count dropped to between 50,000 and 60,000. During pregnancy her baby became severely thrombocytopenic.  Review of Systems  Constitutional: Negative for activity change, appetite change. Weight loss Eyes: Negative for visual disturbance.  Respiratory: Postive for cough, negative for chest tightness or shortness of breath.   Cardiovascular: Negative for chest pain.  Genitourinary: Negative for difficulty urinating.  Neurological: Negative for headaches.  Gastrointestinal: Negative for abdominal pain, melena or hematochezia Psych: Negative for depression or anxiety        Past Medical History  Diagnosis Date  . Chronic ITP (idiopathic thrombocytopenia)   . MTHFR mutation     History   Social History  . Marital Status: Married    Spouse Name: N/A    Number of Children: 3  . Years of Education: N/A   Occupational History  .     Social History Main Topics  . Smoking status: Light Tobacco Smoker -- 0.25 packs/day    Types:  Cigarettes  . Smokeless tobacco: Never Used  . Alcohol Use: Yes     Comment: socially   . Drug Use: No  . Sexual Activity: Yes    Partners: Male    Birth Control/ Protection: None   Other Topics Concern  . Not on file   Social History Narrative   Stay at home mom   3 children ages 83, 16, 3    No past surgical history on file.  No family history on file.  Allergies  Allergen Reactions  . Clindamycin     REACTION: rash, sob  . Codeine     REACTION: sleepless,itches  . Penicillins     REACTION: rash    No current outpatient prescriptions on file prior to visit.   No current facility-administered medications on file prior to visit.    BP 102/70  Pulse 89  Temp(Src) 98.7 F (37.1 C) (Oral)  Wt 117 lb (53.071 kg)  SpO2 97%    Objective:   Physical Exam  Constitutional: She is oriented to person, place, and time. She appears well-developed and well-nourished. No distress.  Cardiovascular: Normal rate, regular rhythm and normal heart sounds.   Pulmonary/Chest: Effort normal and breath sounds normal. She has no wheezes.  Abdominal: Soft.  Mild epigastric tenderness  Neurological: She is alert and oriented to person, place, and time. No cranial nerve deficit.  Skin: Skin is warm and dry.  Psychiatric: She has a normal mood and affect. Her behavior is normal.          Assessment & Plan:

## 2014-03-10 NOTE — Assessment & Plan Note (Signed)
I suspect patient's cough secondary to viral upper respiratory infection versus possible exposure to fumes (mild pneumonitis). Her symptoms are improving on their own. Expectant management

## 2014-03-10 NOTE — Assessment & Plan Note (Signed)
Patient complains of intermittent symptoms of esophageal irritation. She denies any dysphasia. Patient has been exercising on a regular basis and has noticed weight loss. Treat with omeprazole 40 mg for 4-6 weeks then transition to ranitidine.  Patient advised to call office if symptoms persist or worsen.  Reassess in 2 months.

## 2014-03-10 NOTE — Progress Notes (Signed)
Pre visit review using our clinic review tool, if applicable. No additional management support is needed unless otherwise documented below in the visit note. 

## 2014-03-10 NOTE — Telephone Encounter (Signed)
Noted  

## 2014-03-10 NOTE — Telephone Encounter (Signed)
Patient Information:  Caller Name: Roland  Phone: [phone]  Patient: Madison Savage, Madison Savage  Gender: Female  DOB: 1971/01/05  Age: 43 Years  PCP: Stevie Kern [facility])  Pregnant: No  Office Follow Up:  Does the office need to follow up with this patient?: No  Instructions For The Office: N/A  RN Note:  Applied sprayed epoxy finish to bathtub in closed bathroom 02/27/14 for 5-8 hours; did not use mask until the end.  Purchased mask to finish project due to dizziness, malaise.  Denies sore throat or difficulty swallowing.  Waking at night with frequent, hard cough since 03/06/14.  Intermittent dry cough present.  Been smoking minimally due to stress. Encouraged to stop smoking now due to irritations from chemical fumes.  Symptoms  Reason For Call & Symptoms: Painful to swallow solid foods.  Also noted burning sensation or "lump in her throat" since 03/09/14.  Chest tightness present 03/08/14 with burning sensation when eats food.  Reviewed Health History In EMR: Yes  Reviewed Medications In EMR: Yes  Reviewed Allergies In EMR: Yes  Reviewed Surgeries / Procedures: Yes  Date of Onset of Symptoms: 03/06/2014  Treatments Tried: Zantac, Ibuprofen  Treatments Tried Worked: No OB / GYN:  LMP: 02/11/2014  Guideline(s) Used:  Cough  Disposition Per Guideline:   See Today in Office  Reason For Disposition Reached:   Severe coughing spells (e.g., whooping sound after coughing, vomiting after coughing)  Advice Given:  Reassurance  Coughing is the way that our lungs remove irritants and mucus. It helps protect our lungs from getting pneumonia.  You can get a dry hacking cough after a chest cold. Sometimes this type of cough can last 1-3 weeks, and be worse at night.  You can also get a cough after being exposed to irritating substances like smoke, strong perfumes, and dust.  Cough Medicines:  OTC Cough Syrups: The most common cough suppressant in OTC cough medications is  dextromethorphan. Often the letters "DM" appear in the name.  OTC Cough Drops: Cough drops can help a lot, especially for mild coughs. They reduce coughing by soothing your irritated throat and removing that tickle sensation in the back of the throat. Cough drops also have the advantage of portability - you can carry them with you.  Home Remedy - Honey: This old home remedy has been shown to help decrease coughing at night. The adult dosage is 2 teaspoons (10 ml) at bedtime. Honey should not be given to infants under one year of age.  OTC Cough Syrup - Dextromethorphan:  Cough syrups containing the cough suppressant dextromethorphan (DM) may help decrease your cough. Cough syrups work best for coughs that keep you awake at night. They can also sometimes help in the late stages of a respiratory infection when the cough is dry and hacking. They can be used along with cough drops.  Caution - Dextromethorphan:   Do not try to completely suppress coughs that produce mucus and phlegm. Remember that coughing is helpful in bringing up mucus from the lungs and preventing pneumonia.  Coughing Spasms:  Drink warm fluids. Inhale warm mist (Reason: both relax the airway and loosen up the phlegm).  Prevent Dehydration:  Drink adequate liquids.  This will help soothe an irritated or dry throat and loosen up the phlegm.  Avoid Tobacco Smoke:  Smoking or being exposed to smoke makes coughs much worse.  Fever Medicines:  Acetaminophen (e.g., Tylenol):  Regular Strength Tylenol: Take 650 mg (two 325 mg pills)  by mouth every 4-6 hours as needed. Each Regular Strength Tylenol pill has 325 mg of acetaminophen.  Extra Strength Tylenol: Take 1,000 mg (two 500 mg pills) every 8 hours as needed. Each Extra Strength Tylenol pill has 500 mg of acetaminophen.  The most you should take each day is 3,000 mg (10 Regular Strength or 6 Extra Strength pills a day).  Expected Course:   The expected course depends on what is causing  the cough.  Viral bronchitis (chest cold) causes a cough that lasts 1 to 3 weeks. Sometimes you may cough up lots of phlegm (sputum, mucus). The mucus can normally be white, gray, yellow, or green.  Call Back If:  Difficulty breathing  Cough lasts more than 3 weeks  Fever lasts > 3 days  You become worse.  Patient Will Follow Care Advice:  YES  Appointment Scheduled:  03/10/2014 14:15:00 Appointment Scheduled Provider:  Shawna Orleans, Doe-Hyun Herbie [place]) (Adults only)

## 2015-09-11 ENCOUNTER — Encounter: Payer: Self-pay | Admitting: Family Medicine

## 2015-09-11 ENCOUNTER — Ambulatory Visit (INDEPENDENT_AMBULATORY_CARE_PROVIDER_SITE_OTHER): Payer: BLUE CROSS/BLUE SHIELD | Admitting: Family Medicine

## 2015-09-11 VITALS — BP 100/56 | HR 74 | Temp 98.7°F | Wt 114.6 lb

## 2015-09-11 DIAGNOSIS — I959 Hypotension, unspecified: Secondary | ICD-10-CM | POA: Diagnosis not present

## 2015-09-11 NOTE — Progress Notes (Signed)
Pre visit review using our clinic review tool, if applicable. No additional management support is needed unless otherwise documented below in the visit note. 

## 2015-09-11 NOTE — Progress Notes (Signed)
  HPI:  Madison Savage is a pleasant 44 yo F patient of Dr. Sherren Mocha here for and acute visit for:  Low blood pressure: -reports has been for her whole life and feels fine -BP checked at dentist office and they acted like it was really low so she made a visit here  -appear schronic on ROC with BP in 90s/60s in 2014 -reports runs 3-6 miles per day and eats a very healthy low carb and low sodium diet, is very busy and often can go most of the day without drinking much fluids -denies any hx of CP, SOB, DOE, palpitations, racing heart, dizziness, vertigo, eating disorder  ROS: See pertinent positives and negatives per HPI.  Past Medical History  Diagnosis Date  . Chronic ITP (idiopathic thrombocytopenia) (HCC)   . MTHFR mutation ([place])     No past surgical history on file.  No family history on file.  Social History   Social History  . Marital Status: Married    Spouse Name: N/A  . Number of Children: 3  . Years of Education: N/A   Occupational History  .     Social History Main Topics  . Smoking status: Light Tobacco Smoker -- 0.25 packs/day    Types: Cigarettes  . Smokeless tobacco: Never Used  . Alcohol Use: Yes     Comment: socially   . Drug Use: No  . Sexual Activity:    Partners: Male    Birth Control/ Protection: None   Other Topics Concern  . None   Social History Narrative   Stay at home mom   3 children ages 47, 60, 3    No current outpatient prescriptions on file.  EXAM:  Filed Vitals:   09/11/15 1510  BP: 100/56  Pulse: 74  Temp: 98.7 F (37.1 C)    There is no height on file to calculate BMI.  GENERAL: vitals reviewed and listed above, alert, oriented, appears well hydrated and in no acute distress  HEENT: atraumatic, conjunttiva clear, no obvious abnormalities on inspection of external nose and ears  NECK: no obvious masses on inspection  LUNGS: clear to auscultation bilaterally, no wheezes, rales or rhonchi, good air movement  CV:  HRRR, no peripheral edema  MS: moves all extremities without noticeable abnormality  PSYCH: pleasant and cooperative, no obvious depression or anxiety  ASSESSMENT AND PLAN:  Discussed the following assessment and plan:  Hypotension, unspecified hypotension type  -low BP but feels fine -doubt pots, eating disorder or any serious condition -she opted to increase fluid, sodium, calories due to low BMI (normal for her and due to very healthy diet and lots of exercise) and follow up yearly and as needed with PCP -Patient advised to return or notify a doctor immediately if symptoms worsen or persist or new concerns arise.  There are no Patient Instructions on file for this visit.   Colin [name] R.

## 2015-12-08 DIAGNOSIS — N63 Unspecified lump in breast: Secondary | ICD-10-CM | POA: Diagnosis not present

## 2016-01-05 DIAGNOSIS — Z1151 Encounter for screening for human papillomavirus (HPV): Secondary | ICD-10-CM | POA: Diagnosis not present

## 2016-01-05 DIAGNOSIS — Z01419 Encounter for gynecological examination (general) (routine) without abnormal findings: Secondary | ICD-10-CM | POA: Diagnosis not present

## 2016-01-05 DIAGNOSIS — Z681 Body mass index (BMI) 19 or less, adult: Secondary | ICD-10-CM | POA: Diagnosis not present

## 2017-03-14 DIAGNOSIS — N6320 Unspecified lump in the left breast, unspecified quadrant: Secondary | ICD-10-CM | POA: Diagnosis not present

## 2017-03-21 DIAGNOSIS — N6311 Unspecified lump in the right breast, upper outer quadrant: Secondary | ICD-10-CM | POA: Diagnosis not present

## 2017-03-21 DIAGNOSIS — N6332 Unspecified lump in axillary tail of the left breast: Secondary | ICD-10-CM | POA: Diagnosis not present

## 2017-03-21 DIAGNOSIS — N6320 Unspecified lump in the left breast, unspecified quadrant: Secondary | ICD-10-CM | POA: Diagnosis not present

## 2017-03-21 DIAGNOSIS — N6489 Other specified disorders of breast: Secondary | ICD-10-CM | POA: Diagnosis not present

## 2017-03-22 ENCOUNTER — Other Ambulatory Visit: Payer: Self-pay | Admitting: Radiology

## 2017-03-22 DIAGNOSIS — C773 Secondary and unspecified malignant neoplasm of axilla and upper limb lymph nodes: Secondary | ICD-10-CM | POA: Diagnosis not present

## 2017-03-22 DIAGNOSIS — C801 Malignant (primary) neoplasm, unspecified: Secondary | ICD-10-CM | POA: Diagnosis not present

## 2017-03-23 ENCOUNTER — Ambulatory Visit
Admission: RE | Admit: 2017-03-23 | Discharge: 2017-03-23 | Disposition: A | Discharge status: Home / Self Care | Payer: BLUE CROSS/BLUE SHIELD | Source: Ambulatory Visit | Attending: Radiology | Admitting: Radiology

## 2017-03-23 ENCOUNTER — Other Ambulatory Visit: Payer: Self-pay | Admitting: Radiology

## 2017-03-23 DIAGNOSIS — R921 Mammographic calcification found on diagnostic imaging of breast: Secondary | ICD-10-CM

## 2017-03-23 DIAGNOSIS — R928 Other abnormal and inconclusive findings on diagnostic imaging of breast: Secondary | ICD-10-CM

## 2017-03-23 MED ORDER — GADOBENATE DIMEGLUMINE 529 MG/ML IV SOLN
11.0000 mL | Freq: Once | INTRAVENOUS | Status: AC | PRN
  Administered 2017-03-23: 11 mL via INTRAVENOUS

## 2017-03-24 ENCOUNTER — Telehealth: Payer: Self-pay | Admitting: *Deleted

## 2017-03-24 NOTE — Telephone Encounter (Signed)
Confirmed BMDC for 7.25.18 at 0815 .  Instructions and contact information given.  

## 2017-03-24 NOTE — Telephone Encounter (Signed)
Left vm regarding BMDC for 7.25.18. Contact information provided. 

## 2017-03-27 ENCOUNTER — Other Ambulatory Visit: Payer: Self-pay | Admitting: Radiology

## 2017-03-27 DIAGNOSIS — D241 Benign neoplasm of right breast: Secondary | ICD-10-CM | POA: Diagnosis not present

## 2017-03-27 DIAGNOSIS — D242 Benign neoplasm of left breast: Secondary | ICD-10-CM | POA: Diagnosis not present

## 2017-03-27 DIAGNOSIS — N6320 Unspecified lump in the left breast, unspecified quadrant: Secondary | ICD-10-CM | POA: Diagnosis not present

## 2017-03-27 DIAGNOSIS — D0592 Unspecified type of carcinoma in situ of left breast: Secondary | ICD-10-CM | POA: Diagnosis not present

## 2017-03-27 DIAGNOSIS — D0512 Intraductal carcinoma in situ of left breast: Secondary | ICD-10-CM | POA: Diagnosis not present

## 2017-03-28 ENCOUNTER — Ambulatory Visit: Payer: BLUE CROSS/BLUE SHIELD | Admitting: Radiation Oncology

## 2017-03-28 ENCOUNTER — Other Ambulatory Visit: Payer: Self-pay | Admitting: *Deleted

## 2017-03-28 DIAGNOSIS — C773 Secondary and unspecified malignant neoplasm of axilla and upper limb lymph nodes: Principal | ICD-10-CM

## 2017-03-28 DIAGNOSIS — C50912 Malignant neoplasm of unspecified site of left female breast: Secondary | ICD-10-CM

## 2017-03-29 ENCOUNTER — Encounter: Payer: Self-pay | Admitting: Hematology

## 2017-03-29 ENCOUNTER — Other Ambulatory Visit: Payer: Self-pay | Admitting: *Deleted

## 2017-03-29 ENCOUNTER — Other Ambulatory Visit: Payer: Self-pay | Admitting: General Surgery

## 2017-03-29 ENCOUNTER — Other Ambulatory Visit (HOSPITAL_BASED_OUTPATIENT_CLINIC_OR_DEPARTMENT_OTHER): Payer: BLUE CROSS/BLUE SHIELD

## 2017-03-29 ENCOUNTER — Ambulatory Visit: Payer: BLUE CROSS/BLUE SHIELD | Attending: General Surgery | Admitting: Physical Therapy

## 2017-03-29 ENCOUNTER — Encounter: Payer: Self-pay | Admitting: *Deleted

## 2017-03-29 ENCOUNTER — Encounter: Payer: Self-pay | Admitting: Physical Therapy

## 2017-03-29 ENCOUNTER — Ambulatory Visit (HOSPITAL_BASED_OUTPATIENT_CLINIC_OR_DEPARTMENT_OTHER): Payer: BLUE CROSS/BLUE SHIELD | Admitting: Hematology

## 2017-03-29 ENCOUNTER — Ambulatory Visit
Admission: RE | Admit: 2017-03-29 | Discharge: 2017-03-29 | Disposition: A | Discharge status: Home / Self Care | Payer: BLUE CROSS/BLUE SHIELD | Source: Ambulatory Visit | Attending: Radiation Oncology | Admitting: Radiation Oncology

## 2017-03-29 DIAGNOSIS — Z808 Family history of malignant neoplasm of other organs or systems: Secondary | ICD-10-CM

## 2017-03-29 DIAGNOSIS — C773 Secondary and unspecified malignant neoplasm of axilla and upper limb lymph nodes: Secondary | ICD-10-CM | POA: Diagnosis not present

## 2017-03-29 DIAGNOSIS — C50912 Malignant neoplasm of unspecified site of left female breast: Secondary | ICD-10-CM | POA: Diagnosis not present

## 2017-03-29 DIAGNOSIS — Z807 Family history of other malignant neoplasms of lymphoid, hematopoietic and related tissues: Secondary | ICD-10-CM | POA: Diagnosis not present

## 2017-03-29 DIAGNOSIS — Z809 Family history of malignant neoplasm, unspecified: Secondary | ICD-10-CM

## 2017-03-29 DIAGNOSIS — D693 Immune thrombocytopenic purpura: Secondary | ICD-10-CM | POA: Diagnosis not present

## 2017-03-29 DIAGNOSIS — R293 Abnormal posture: Secondary | ICD-10-CM | POA: Insufficient documentation

## 2017-03-29 DIAGNOSIS — Z803 Family history of malignant neoplasm of breast: Secondary | ICD-10-CM | POA: Diagnosis not present

## 2017-03-29 DIAGNOSIS — F17211 Nicotine dependence, cigarettes, in remission: Secondary | ICD-10-CM

## 2017-03-29 DIAGNOSIS — Z170 Estrogen receptor positive status [ER+]: Principal | ICD-10-CM

## 2017-03-29 DIAGNOSIS — C50412 Malignant neoplasm of upper-outer quadrant of left female breast: Secondary | ICD-10-CM | POA: Insufficient documentation

## 2017-03-29 DIAGNOSIS — E7212 Methylenetetrahydrofolate reductase deficiency: Secondary | ICD-10-CM

## 2017-03-29 DIAGNOSIS — Z801 Family history of malignant neoplasm of trachea, bronchus and lung: Secondary | ICD-10-CM | POA: Diagnosis not present

## 2017-03-29 LAB — COMPREHENSIVE METABOLIC PANEL
ALT: 12 U/L (ref 0–55)
AST: 12 U/L (ref 5–34)
Albumin: 4.1 g/dL (ref 3.5–5.0)
Alkaline Phosphatase: 55 U/L (ref 40–150)
Anion Gap: 7 mEq/L (ref 3–11)
BUN: 14.5 mg/dL (ref 7.0–26.0)
CO2: 27 mEq/L (ref 22–29)
Calcium: 9.6 mg/dL (ref 8.4–10.4)
Chloride: 106 mEq/L (ref 98–109)
Creatinine: 0.9 mg/dL (ref 0.6–1.1)
EGFR: 79 mL/min/{1.73_m2} — ABNORMAL LOW (ref >90)
Glucose: 91 mg/dl (ref 70–140)
Potassium: 4.4 mEq/L (ref 3.5–5.1)
Sodium: 140 mEq/L (ref 136–145)
Total Bilirubin: 0.32 mg/dL (ref 0.20–1.20)
Total Protein: 6.6 g/dL (ref 6.4–8.3)

## 2017-03-29 LAB — CBC WITH DIFFERENTIAL/PLATELET
BASO%: 0.2 % (ref 0.0–2.0)
Basophils Absolute: 0 10*3/uL (ref 0.0–0.1)
EOS%: 3.6 % (ref 0.0–7.0)
Eosinophils Absolute: 0.2 10*3/uL (ref 0.0–0.5)
HCT: 41.7 % (ref 34.8–46.6)
HGB: 14.1 g/dL (ref 11.6–15.9)
LYMPH%: 40.8 % (ref 14.0–49.7)
MCH: 30.5 pg (ref 25.1–34.0)
MCHC: 33.8 g/dL (ref 31.5–36.0)
MCV: 90.1 fL (ref 79.5–101.0)
MONO#: 0.5 10*3/uL (ref 0.1–0.9)
MONO%: 9.2 % (ref 0.0–14.0)
NEUT#: 2.4 10*3/uL (ref 1.5–6.5)
NEUT%: 46.2 % (ref 38.4–76.8)
Platelets: 158 10*3/uL (ref 145–400)
RBC: 4.63 10*6/uL (ref 3.70–5.45)
RDW: 13.3 % (ref 11.2–14.5)
WBC: 5.2 10*3/uL (ref 3.9–10.3)
lymph#: 2.1 10*3/uL (ref 0.9–3.3)

## 2017-03-29 MED ORDER — LORAZEPAM 0.5 MG PO TABS: ORAL_TABLET | ORAL | 1 refills | Status: DC

## 2017-03-29 MED ORDER — PREDNISONE 50 MG PO TABS: ORAL_TABLET | ORAL | 0 refills | Status: DC

## 2017-03-29 NOTE — Progress Notes (Signed)
Nutrition Assessment  Reason for Assessment:  Pt seen in Breast Clinic  ASSESSMENT:   46 year old female with new diagnosis of left breast cancer.  Past medical history of esophagitis, idiopathic thrombocytopenia.  Patient reports poor po intake with anxiety and news of new diagnosis of breast cancer.    Medications:  reviewed  Labs: reviewed  Anthropometrics:   Height: 67 inches Weight: 116 lb 12.8 oz BMI: 18  Patient reports this is normal weight.  Patient is a runner   NUTRITION DIAGNOSIS: Food and nutrition related knowledge deficit related to new diagnosis of breast cancer as evidenced by no prior need for nutrition related information.  INTERVENTION:   Discussed and provided packet of information regarding nutritional tips for breast cancer patients.  Questions answered.  Teachback method used.  Contact information provided and patient knows to contact me with questions/concerns.    MONITORING, EVALUATION, and GOAL: Pt will consume a healthy plant based diet to maintain lean body mass throughout treatment.   Madison Savage, [place], [place] Registered Dietitian [phone] (pager)

## 2017-03-29 NOTE — Progress Notes (Signed)
Radiation Oncology         [phone] ________________________________  Name: Madison Savage MRN: [id number]  Date: 03/29/2017  DOB: 1970-10-07  QB:HALP, Jory Ee, MD  [name], MD     REFERRING PHYSICIAN: [name], MD   DIAGNOSIS: The encounter diagnosis was Breast cancer metastasized to axillary lymph node, left ([place]).   HISTORY OF PRESENT ILLNESS: Madison Savage is a 46 y.o. female seen in the multidisciplinary breast conference for a new diagnosis of a left  breast cancer. The patient noted a palpable mass in the upper outer quadrant of the left breast. She underwent diagnostic mammogram and the left breast identified a 3 mm lesion at 3:00 in the right breast. A 2.1 x 1 x .7 cm mass in the left breast as well as 6 abnormal axillary nodes were also seen. 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 She comes today to discuss options of her treatment. She was unable to tolerate her MRI scan that had been set up a few days ago.  PREVIOUS RADIATION THERAPY: No   PAST MEDICAL HISTORY:  Past Medical History:  Diagnosis Date  . Chronic ITP (idiopathic thrombocytopenia) (HCC)   . MTHFR mutation ([place])        PAST SURGICAL HISTORY:No past surgical history on file.   FAMILY HISTORY:  Family History  Problem Relation Age of Onset  . Cancer Paternal Aunt        breast cancer   . Cancer Paternal Grandmother        breast cancer   . Cancer Maternal Aunt        lymphoma   . Cancer Paternal Grandfather        throat cancer   . Cancer Paternal Aunt        lung cancer     SOCIAL HISTORY:  reports that she quit smoking about 3 weeks ago. Her smoking use included Cigarettes. She has a 10.00 pack-year smoking history. She has never used smokeless tobacco. She reports that she drinks alcohol. She reports that  she does not use drugs. The patient is married and lives in [place]. 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 She's originally from [place].   ALLERGIES: Clindamycin; Penicillins; and Codeine   MEDICATIONS:  Current Outpatient Prescriptions  Medication Sig Dispense Refill  . Cyanocobalamin (VITAMIN B-12 PO) Take 1 tablet by mouth daily. With Vit C.    . Echinacea 125 MG TABS Take 1 tablet by mouth daily.    Marland Kitchen LORazepam (ATIVAN) 0.5 MG tablet Take 1- 2 tablets by mouth as needed for anxiety 30 tablet 1  . Multiple Vitamins-Calcium (ONE-A-DAY WOMENS PO) Take 1 tablet by mouth daily.    Marland Kitchen OVER THE COUNTER MEDICATION Take 1 tablet by mouth daily. Apple Cider Vinegar tablet.    . predniSONE (DELTASONE) 50 MG tablet Take '50mg'$  by mouth 13, 7, and 1 hour pre MRI 3 tablet 0   No current facility-administered medications for this encounter.      REVIEW OF SYSTEMS: On review of systems, the patient reports that she is doing well overall but has been anxious since her diagnosis and was unable to tolerate an MRI previously. She has some soreness in her left shoulder. She denies any chest pain, shortness of breath, cough, fevers, chills, night sweats, unintended weight  changes. She reports exercising regularly. She denies any bowel or bladder disturbances, and denies abdominal pain, nausea or vomiting. She denies any new musculoskeletal or joint aches or pains. A complete review of systems is obtained and is otherwise negative.     PHYSICAL EXAM:  Wt Readings from Last 3 Encounters:  03/29/17 116 lb 12.8 oz (53 kg)  09/11/15 114 lb 9.6 oz (52 kg)  03/10/14 117 lb (53.1 kg)   Temp Readings from Last 3 Encounters:  03/29/17 97.7 F (36.5 C) (Oral)  09/11/15 98.7 F (37.1 C) (Oral)  03/10/14 98.7 F (37.1 C) (Oral)   BP Readings from Last 3 Encounters:  03/29/17 95/63  09/11/15 (!) 100/56  03/10/14 102/70   Pulse Readings from Last 3 Encounters:  03/29/17 (!) 20  09/11/15 74  03/10/14 89     In general this is a well appearing caucasian female  in no acute distress. She is alert and oriented x4 and appropriate throughout the examination. HEENT reveals that the patient is normocephalic, atraumatic. EOMs are intact. PERRLA. Skin is intact without any evidence of gross lesions. Cardiovascular exam reveals a regular rate and rhythm, no clicks rubs or murmurs are auscultated. Chest is clear to auscultation bilaterally. Lymphatic assessment is performed and reveals fullness in the left axilla, and no other adenopathy is noted in the cervical, supraclavicular, axillary, or inguinal chains. Right breast reveals ecchymosis without palpable abnormalities, the left breast reveals fullness consistent with her known mass and tighness along the left axilla, with moderate ecchymosis. Abdomen has active bowel sounds in all quadrants and is intact. The abdomen is soft, non tender, non distended. Lower extremities are negative for pretibial pitting edema, deep calf tenderness, cyanosis or clubbing.   ECOG = 0  0 - Asymptomatic (Fully active, able to carry on all predisease activities without restriction)  1 - Symptomatic but completely ambulatory (Restricted in physically strenuous activity but ambulatory and able to carry out work of a light or sedentary nature. For example, light housework, office work)  2 - Symptomatic, <50% in bed during the day (Ambulatory and capable of all self care but unable to carry out any work activities. Up and about more than 50% of waking hours)  3 - Symptomatic, >50% in bed, but not bedbound (Capable of only limited self-care, confined to bed or chair 50% or more of waking hours)  4 - Bedbound (Completely disabled. Cannot carry on any self-care. Totally confined to bed or chair)  5 - Death   Eustace Pen MM, Creech RH, Tormey DC, et al. [id number]). "Toxicity and response criteria of the [facility]". [place] Oncol. 5 (6): 649-55    LABORATORY  DATA:  Lab Results  Component Value Date   WBC 5.2 03/29/2017   HGB 14.1 03/29/2017   HCT 41.7 03/29/2017   MCV 90.1 03/29/2017   PLT 158 03/29/2017   Lab Results  Component Value Date   NA 140 03/29/2017   K 4.4 03/29/2017   CL 108 07/26/2008   CO2 27 03/29/2017   Lab Results  Component Value Date   ALT 12 03/29/2017   AST 12 03/29/2017   ALKPHOS 55 03/29/2017   BILITOT 0.32 03/29/2017      RADIOGRAPHY: No results found.     IMPRESSION/PLAN: 1. Unknown stage, ER/PR positive, metastatic invasive lobular carcinoma of the left breast to axillary nodes. Dr. Lisbeth Renshaw discusses the pathology findings and reviews the nature of locally advanced lobular disease disease. The consensus from the  breast conference included genetic testing, with staging work up including a CT scan and bone scan. She is also going to undergo an MRI to determine extent of disease. She will proceed with neoadjuvant chemotherapy, and based on genetics will determine her plans surgically for either lumpectomy and axillary node dissection versus mastectomy with axillary dissection. Her course would then be followed by external radiotherapy to the breast and regional nodes followed by antiestrogen therapy. We discussed the risks, benefits, short, and long term effects of radiotherapy, and the patient is interested in proceeding. Dr. Lisbeth Renshaw discusses the delivery and logistics of radiotherapy and would anticipate a course of 6 1/2 weeks of treatment. We will see her back about 2 weeks after surgery to move forward with the simulation and planning process and anticipate starting radiotherapy about 4 weeks after surgery.  2. Possible genetic predisposition to malignancy. The patient will meet with genetics given her family and personal history of breast cancer. This information will help her determine her surgical plans. 3. Contraceptive counseling. The patient is not planning on subsequent pregnancy and will discuss this  further with Dr. Burr Medico regarding her cycles as it relates to her ER/PR positive breast cancer.   The above documentation reflects my direct findings during this shared patient visit. Please see the separate note by Dr. Lisbeth Renshaw on this date for the remainder of the patient's plan of care.    Carola Rhine, PAC

## 2017-03-29 NOTE — Therapy (Signed)
[place], [place], [address] Phone: [phone]   Fax:  [phone]  Physical Therapy Evaluation  Patient Details  Name: Madison Savage MRN: [id number] Date of Birth: Jan 06, 1971 Referring Provider: Dr. [name]  Encounter Date: 03/29/2017      PT End of Session - 03/29/17 1225    Visit Number 1   Number of Visits 1   PT Start Time 1130   PT Stop Time 1210   PT Time Calculation (min) 40 min   Activity Tolerance Patient tolerated treatment well   Behavior During Therapy [facility] for tasks assessed/performed      Past Medical History:  Diagnosis Date  . Chronic ITP (idiopathic thrombocytopenia) (HCC)   . MTHFR mutation ([place])     History reviewed. No pertinent surgical history.  There were no vitals filed for this visit.       Subjective Assessment - 03/29/17 1213    Subjective Patient reports she is here today to be seen by her medical team for her newly diagnosed left breast cancer.   Patient is accompained by: Family member   Pertinent History Patient was diagnosed on 03/21/17 with left invasive lobular carcinoma breast cancer. It measures 2.1 cm and is located in the upper outer quadrant. She also has 6 abnormal axillary lymph nodes and a node was biopsied and found to be positive for carcinoma.   Patient Stated Goals Reduce lymphedema risk and learn post op shoulder ROM HEP   Currently in Pain? No/denies            [facility] PT Assessment - 03/29/17 0001      Assessment   Medical Diagnosis Left breast cancer   Referring Provider Dr. [name]   Onset Date/Surgical Date 03/21/17   Hand Dominance Right   Prior Therapy none     Precautions   Precautions Other (comment)   Precaution Comments active cancer     Restrictions   Weight Bearing Restrictions No     Balance Screen   Has the patient fallen in the past 6 months No   Has the patient had a decrease in activity level  because of a fear of falling?  No   Is the patient reluctant to leave their home because of a fear of falling?  No     Home Social worker Private residence   Living Arrangements Spouse/significant other;Children  Husband, 54, 47, and 22 y.o. kids   Available Help at Discharge Family     Prior Function   Level of Independence Independent   Vocation Unemployed   Leisure She does high intensity interval training 6x/week for 2 hours each time     Cognition   Overall Cognitive Status Within Functional Limits for tasks assessed     Posture/Postural Control   Posture/Postural Control Postural limitations   Postural Limitations Forward head;Rounded Shoulders     ROM / Strength   AROM / PROM / Strength AROM;Strength     AROM   AROM Assessment Site Shoulder;Cervical   Right/Left Shoulder Left;Right   Right Shoulder Extension 48 Degrees   Right Shoulder Flexion 138 Degrees   Right Shoulder ABduction 147 Degrees   Right Shoulder Internal Rotation 62 Degrees   Right Shoulder External Rotation 68 Degrees   Left Shoulder Extension 47 Degrees   Left Shoulder Flexion 135 Degrees   Left Shoulder ABduction 154 Degrees   Left Shoulder Internal Rotation 70 Degrees   Left Shoulder External  Rotation 83 Degrees   Cervical Flexion WNL   Cervical Extension WNL   Cervical - Right Side Bend WNL   Cervical - Left Side Bend WNL   Cervical - Right Rotation WNL   Cervical - Left Rotation WNL     Strength   Overall Strength Within functional limits for tasks performed           LYMPHEDEMA/ONCOLOGY QUESTIONNAIRE - 03/29/17 1222      Type   Cancer Type Left breast cancer     Lymphedema Assessments   Lymphedema Assessments Upper extremities     Right Upper Extremity Lymphedema   10 cm Proximal to Olecranon Process 23.1 cm   Olecranon Process 21.7 cm   10 cm Proximal to Ulnar Styloid Process 16.7 cm   Just Proximal to Ulnar Styloid Process 13.2 cm   Across Hand at [employer] 17 cm   At [place] of 2nd Digit 5.2 cm     Left Upper Extremity Lymphedema   10 cm Proximal to Olecranon Process 23.8 cm   Olecranon Process 22.3 cm   10 cm Proximal to Ulnar Styloid Process 16.5 cm   Just Proximal to Ulnar Styloid Process 12.7 cm   Across Hand at [employer] 16.8 cm   At [place] of 2nd Digit 5.4 cm         Objective measurements completed on examination: See above findings.        Patient was instructed today in a home exercise program today for post op shoulder range of motion. These included active assist shoulder flexion in sitting, scapular retraction, wall walking with shoulder abduction, and hands behind head external rotation.  She was encouraged to do these twice a day, holding 3 seconds and repeating 5 times when permitted by her physician.              PT Education - 03/29/17 1224    Education provided Yes   Education Details Lymphedema risk reduction and post op shoulder ROM HEP   Person(s) Educated Patient;Spouse   Methods Explanation;Demonstration;Handout   Comprehension Returned demonstration;Verbalized understanding              Breast Clinic Goals - 03/29/17 1232      Patient will be able to verbalize understanding of pertinent lymphedema risk reduction practices relevant to her diagnosis specifically related to skin care.   Time 1   Period Days   Status Achieved     Patient will be able to return demonstrate and/or verbalize understanding of the post-op home exercise program related to regaining shoulder range of motion.   Time 1   Period Days   Status Achieved     Patient will be able to verbalize understanding of the importance of attending the postoperative After Breast Cancer Class for further lymphedema risk reduction education and therapeutic exercise.   Time 1   Period Days   Status Achieved               Plan - 03/29/17 1225    Clinical Impression Statement Patient was diagnosed on 03/21/17  with left invasive lobular carcinoma breast cancer. It measures 2.1 cm and is located in the upper outer quadrant. She also has 6 abnormal axillary lymph nodes and a node was biopsied and found to be positive for carcinoma. Her multidisciplinary medical team met prior to her assessments to determine a recommended treatment plan. She is planning to have genetic testing, an MRI, neoadjuvant chemotherapy, and either a  left lumpectomy or mastectomy with an axillary lymph node dissection followed by radiation. She will benefit from post op PT to regain shoulder ROM and reduce lymphedema risk as she will be at high risk.   History and Personal Factors relevant to plan of care: Unknown extent of disease; cares for 3 children at home   Clinical Presentation Evolving   Clinical Presentation due to: Patient needs to [facility] further testing to determine extent of disease as she has multiple positive axillary nodes and those results along with genetic testing will determine surgical plan.   Clinical Decision Making Moderate   Rehab Potential Excellent   Clinical Impairments Affecting Rehab Potential Possible extent of disease   PT Frequency One time visit   PT Treatment/Interventions Therapeutic exercise;Patient/family education   PT Next Visit Plan Will f/u after surgery to determine PT needs   PT Home Exercise Plan Post op shoulder ROM HEP   Consulted and Agree with Plan of Care Patient;Family member/caregiver   Family Member Consulted Husband      Patient will benefit from skilled therapeutic intervention in order to improve the following deficits and impairments:  Decreased range of motion, Impaired UE functional use, Pain, Decreased knowledge of precautions, Postural dysfunction  Visit Diagnosis: Breast cancer metastasized to axillary lymph node, left ([place]) - Plan: PT plan of care cert/re-cert  Abnormal posture - Plan: PT plan of care cert/re-cert   Patient will follow up at outpatient cancer rehab if  needed following surgery.  If the patient requires physical therapy at that time, a specific plan will be dictated and sent to the referring physician for approval. The patient was educated today on appropriate basic range of motion exercises to begin post operatively and the importance of attending the After Breast Cancer class following surgery.  Patient was educated today on lymphedema risk reduction practices as it pertains to recommendations that will benefit the patient immediately following surgery.  She verbalized good understanding.  No additional physical therapy is indicated at this time.      Problem List Patient Active Problem List   Diagnosis Date Noted  . Breast cancer metastasized to axillary lymph node, left ([place]) 03/29/2017  . Cough 03/10/2014  . Esophagitis 03/10/2014  . Chronic ITP (idiopathic thrombocytopenia) (HCC) 03/10/2014  . ALLERGIC REACTION, ACUTE 07/22/2008    Annia Friendly, PT 03/29/17 12:33 PM  [place], [place], [address] Phone: [phone]   Fax:  [phone]  Name: Madison Savage MRN: [id number] Date of Birth: 1971-08-21

## 2017-03-29 NOTE — Progress Notes (Signed)
Clinical Social Work [place] Psychosocial Distress Screening [name]  Patient completed distress screening protocol and scored a 10 on the Psychosocial Distress Thermometer which indicates severe distress. Clinical Social Worker met with patient and patients husband in [facility] to assess for distress and other psychosocial needs. Patient stated she was feeling overwhelmed and anxious, but was appreciative of the treatment team and getting more information on her treatment plan. CSW and patient discussed common feeling and emotions when being diagnosed with cancer, and the importance of support during treatment. CSW informed patient of the support team and support services at [facility]. CSW provided contact information and encouraged patient to call with any questions or concerns.  ONCBCN DISTRESS SCREENING 03/29/2017  Screening Type Initial Screening  Distress experienced in past week (1-10) 10  Emotional problem type Nervousness/Anxiety;Adjusting to illness;Feeling hopeless  Physical Problem type Sleep/insomnia  Physician notified of physical symptoms Yes     Madison Savage, MSW, LCSW, OSW-C Clinical Social Worker [facility] [phone]

## 2017-03-29 NOTE — Progress Notes (Signed)
[facility]  [phone] [phone]  Clinic New Consult Note   Patient Care Team: Todd, Jeffrey A, MD as PCP - General Byerly, Faera, MD as Consulting Physician (General Surgery) Feng, Yan, MD as Consulting Physician (Hematology) Moody, John, MD as Consulting Physician (Radiation Oncology) Taavon, Richard, MD as Consulting Physician (Obstetrics and Gynecology) 03/29/2017  CHIEF COMPLAINTS/PURPOSE OF CONSULTATION:  Breast cancer metastasized to axillary lymph node, left   Oncology History   Cancer Staging Breast cancer metastasized to axillary lymph node, left (HCC) Staging form: Breast, AJCC 8th Edition - Clinical stage from 03/22/2017: Stage Unknown (cTX, cN1, cM0, GX, ER: Positive, PR: Positive, HER2: Negative) - Signed by Feng, Yan, MD on 03/29/2017 \     Breast cancer metastasized to axillary lymph node, left (HCC)   03/21/2017 Mammogram    Diagnostic mammogram bilateral 03/21/17 IMPRESSION: INCOMPLETE - ADDITIONAL IMAGING EVALUATION NEEDED 1. The 0.6 cm oval mass in the right breast at 11 o'clock middle depth is indeterminate. An ultrasound is recommended.  2. The 0.5cm round focal asymmetry in the left breast central to the nipple middle depth is indeterminate. An ultrasound is recommended.  3. Ultrasound of the palpable abnormalities in the axilla and far left lateral breast is recommended.  4. Multiple clusters of pleomorphic calcifications in the left upper outer breast anterior depth spanning a 2.5 cm area are highly suggestive of malignancy. A stereotactic biopsy is recommended.        03/21/2017 Imaging    US Breast bilateral 03/21/17 IMPRESSION: HIGHLY SUGGESTIVE OF MALIGNANCY 1. Six distinct abnormal left axillary nodes, 2 of which correspond to the palpable lumps felt by the patient. Findings concerning for metastatic adenopathy. Ultrasound guided biopsy of one of these nodes is recommended.  2. Small 5 mm palpable, oval mass in the  left breast 3:00 areolar margin is at an intermediate suspicion. Ultrasound guided biopsy is recommended.  3. Isoechoic 8 mm mass in the right breast 10:00 5 cm from the nipple is also an intermediate suspicion for malignancy, and ultrasound guided biopsy is recommended.  4. Stereotactic guided biopsy for the highly suspicious calcifications in the left breast also recommended as detailed in the mammography report.        03/22/2017 Initial Biopsy    Diagnosis 03/22/17 Lymph node, needle/core biopsy, left axillary node -METASTATIC CARCINOMA, SEE COMMENT Microscopic Comment      03/22/2017 Receptors her2    Lymph node biopsy ER 95% positive, strong staining, PR 5% positive, weak staining, HER-2 negative, with HER2/CEP17 ratio 1.23 and copy #3.95.      03/27/2017 Pathology Results    Diagnosis 03/27/17 1. Breast, left, needle core biopsy -FIBROADENOMATOID NODULE WITH CALCIFICATIONS 2.Breast, left, needle core biopsy -DUCTAL CARCINOMA IN SITU, HIGH GRADE -SEE COMMENT  3. Breast, right, needle, core biopsy -FIBROADENOMA -NO MALIGNANCY IDENTIFIED       03/29/2017 Initial Diagnosis    Breast cancer metastasized to axillary lymph node, left (HCC)       HISTORY OF PRESENTING ILLNESS: 03/29/17 Madison Savage 46 y.o. female is here because of newly diagnosed Breast cancer metastasized to axillary lymph node, left. She presents to Breast Clinic today with her husband. She felt the lump herself June 22nd. It was soft and "squishy" and it moved. First in the left breast and then four days latter in the left axilla. She feels they have gotten smaller.   In the past she was diagnosed with being ITP positive. Period has been irregular more since her   last miscarriage in 2015. She has MTHFR gene. In pregnancy her plt counts have gone low. She has not seen homologist for this. All 3 children were vaginal births.  Today she reports that with the lumps she feels a dull ache that comes and goes. She  has not noticed any nipple or skin change. She had not had a mammogram since she was 21. Her PCP was going to set her up with a mammogram last year. Dr. Taavon is her OB and Dr. Todd is her current PCP. She feels no change in appetite or weight loss. She quit smoking 3 weeks ago. She works out everyday. She reports her last attempt at the MRI, the contrast made her feel like she could not breathe, her head was pounding and she felt hot all over and she was shaking. The trouble breathing only lasted while she was in the MRI.  Husband shared concern with insurance and financial. And referrals to second opinions.    GYN HISTORY  Menarchal: 16 LMP: 03/05/2017 Contraceptive: none HRT: N/A G7P3: - 3 miscarriages, 1 abortion     MEDICAL HISTORY:  Past Medical History:  Diagnosis Date  . Chronic ITP (idiopathic thrombocytopenia) (HCC)   . MTHFR mutation (HCC)     SURGICAL HISTORY: History reviewed. No pertinent surgical history.  SOCIAL HISTORY: Social History   Social History  . Marital status: Married    Spouse name: N/A  . Number of children: 3  . Years of education: N/A   Occupational History  .  Unemployed   Social History Main Topics  . Smoking status: Former Smoker    Packs/day: 0.50    Years: 20.00    Types: Cigarettes    Quit date: 03/05/2017  . Smokeless tobacco: Never Used  . Alcohol use Yes     Comment: socially   . Drug use: No  . Sexual activity: Yes    Partners: Male    Birth control/ protection: None   Other Topics Concern  . Not on file   Social History Narrative   Stay at home mom   3 children ages 14, 10, 3    FAMILY HISTORY: Family History  Problem Relation Age of Onset  . Cancer Paternal Aunt        breast cancer   . Cancer Paternal Grandmother        breast cancer   . Cancer Maternal Aunt        lymphoma   . Cancer Paternal Grandfather        throat cancer   . Cancer Paternal Aunt        lung cancer    ALLERGIES:  is allergic to  clindamycin; penicillins; and codeine.  MEDICATIONS:  Current Outpatient Prescriptions  Medication Sig Dispense Refill  . Cyanocobalamin (VITAMIN B-12 PO) Take 1 tablet by mouth daily. With Vit C.    . Echinacea 125 MG TABS Take 1 tablet by mouth daily.    . Multiple Vitamins-Calcium (ONE-A-DAY WOMENS PO) Take 1 tablet by mouth daily.    . OVER THE COUNTER MEDICATION Take 1 tablet by mouth daily. Apple Cider Vinegar tablet.    . LORazepam (ATIVAN) 0.5 MG tablet Take 1- 2 tablets by mouth as needed for anxiety 30 tablet 1  . predniSONE (DELTASONE) 50 MG tablet Take 50mg by mouth 13, 7, and 1 hour pre MRI 3 tablet 0   No current facility-administered medications for this visit.     REVIEW OF SYSTEMS:   Constitutional:   Denies fevers, chills or abnormal night sweats Eyes: Denies blurriness of vision, double vision or watery eyes Ears, nose, mouth, throat, and face: Denies mucositis or sore throat Respiratory: Denies cough, dyspnea or wheezes Cardiovascular: Denies palpitation, chest discomfort or lower extremity swelling Gastrointestinal:  Denies nausea, heartburn or change in bowel habits Skin: Denies abnormal skin rashes Lymphatics: Denies new lymphadenopathy or easy bruising Neurological:Denies numbness, tingling or new weaknesses Behavioral/Psych: Mood is stable, no new changes  All other systems were reviewed with the patient and are negative. Breast: (+) soft, palpable lumps in left breast and left axilla  PHYSICAL EXAMINATION: ECOG PERFORMANCE STATUS: 0 - Asymptomatic  Vitals:   03/29/17 0908  BP: 95/63  Pulse: (!) 20  Resp: 18  Temp: 97.7 F (36.5 C)   Filed Weights   03/29/17 0908  Weight: 116 lb 12.8 oz (53 kg)    GENERAL:alert, no distress and comfortable SKIN: skin color, texture, turgor are normal, no rashes or significant lesions EYES: normal, conjunctiva are pink and non-injected, sclera clear OROPHARYNX:no exudate, no erythema and lips, buccal mucosa, and  tongue normal  NECK: supple, thyroid normal size, non-tender, without nodularity LYMPH:  no palpable lymphadenopathy in the cervical, axillary or inguinal LUNGS: clear to auscultation and percussion with normal breathing effort HEART: regular rate & rhythm and no murmurs and no lower extremity edema ABDOMEN:abdomen soft, non-tender and normal bowel sounds Musculoskeletal:no cyanosis of digits and no clubbing  PSYCH: alert & oriented x 3 with fluent speech NEURO: no focal motor/sensory deficits Breast: (+) 1cm movable subcutaneus Nodule in the UOQ in left breast , 0.5-1cm lymph nodes in the left axilla, movable.   LABORATORY DATA:  I have reviewed the data as listed CBC Latest Ref Rng & Units 03/29/2017 08/27/2010 08/26/2010  WBC 3.9 - 10.3 10e3/uL 5.2 12.0(H) 11.5(H)  Hemoglobin 11.6 - 15.9 g/dL 14.1 10.8 DELTA CHECK NOTED(L) 13.1  Hematocrit 34.8 - 46.6 % 41.7 32.8(L) 38.4  Platelets 145 - 400 10e3/uL 158 122(L) 149(L)    CMP Latest Ref Rng & Units 03/29/2017 07/26/2008  Glucose 70 - 140 mg/dl 91 175(H)  BUN 7.0 - 26.0 mg/dL 14.5 5(L)  Creatinine 0.6 - 1.1 mg/dL 0.9 0.68  Sodium 136 - 145 mEq/L 140 139  Potassium 3.5 - 5.1 mEq/L 4.4 4.4  Chloride - - 108  CO2 22 - 29 mEq/L 27 26  Calcium 8.4 - 10.4 mg/dL 9.6 8.3(L)  Total Protein 6.4 - 8.3 g/dL 6.6 -  Total Bilirubin 0.20 - 1.20 mg/dL 0.32 -  Alkaline Phos 40 - 150 U/L 55 -  AST 5 - 34 U/L 12 -  ALT 0 - 55 U/L 12 -   PATHOLOGY  Diagnosis 03/27/17 1. Breast, left, needle core biopsy -FIBROADENOMATOID NODULE WITH CALCIFICATIONS 2.Breast, left, needle core biopsy -DUCTAL CARCINOMA IN SITU, HIGH GRADE -SEE COMMENT  3. Breast, right, needle, core biopsy -FIBROADENOMA -NO MALIGNANCY IDENTIFIED  Diagnosis 03/22/17 Lymph node, needle/core biopsy, left axillary node -METASTATIC CARCINOMA, SEE COMMENT Microscopic Comment Sections showed lymph node tissue mostly replaced by metastatic carcinoma characterized by sheets of  epithelioid appearing cells displaying milder to moderate nuclear pleomorphism, fine chromatin, small nucleoli, and moderately abundant amphophilic to eosinophilic cyto-plasma. Immunohistochemistry staining showed positive for cytokeratin AE1/AE3, GATA-3 and estrogen receptor, with patchy positivity for cytokeratin 7 and GCDFP., Only scattered cells showed progesterone positivity. Negative for S100, melan-a, E-cadherin, cytokeratin 20, CDX-2, TTF-1 or WT-1. The findings are consistent with metastatic carcinoma of breast primary. Lack of significant e-cadherin positivity suggested  lobular carcinoma.   RADIOGRAPHIC STUDIES: I have personally reviewed the radiological images as listed and agreed with the findings in the report.  Diagnostic mammogram bilateral 03/21/17 IMPRESSION: INCOMPLETE - ADDITIONAL IMAGING EVALUATION NEEDED 1. The 0.6 cm oval mass in the right breast at 11 o'clock middle depth is indeterminate. An ultrasound is recommended.  2. The 0.5cm round focal asymmetry in the left breast central to the nipple middle depth is indeterminate. An ultrasound is recommended.  3. Ultrasound of the palpable abnormalities in the axilla and far left lateral breast is recommended.  4. Multiple clusters of pleomorphic calcifications in the left upper outer breast anterior depth spanning a 2.5 cm area are highly suggestive of malignancy. A stereotactic biopsy is recommended.   US Breast bilateral 03/21/17 IMPRESSION: HIGHLY SUGGESTIVE OF MALIGNANCY 1. Six distinct abnormal left axillary nodes, 2 of which correspond to the palpable lumps felt by the patient. Findings concerning for metastatic adenopathy. Ultrasound guided biopsy of one of these nodes is recommended.  2. Small 5 mm palpable, oval mass in the left breast 3:00 areolar margin is at an intermediate suspicion. Ultrasound guided biopsy is recommended.  3. Isoechoic 8 mm mass in the right breast 10:00 5 cm from the nipple is also an intermediate  suspicion for malignancy, and ultrasound guided biopsy is recommended.  4. Stereotactic guided biopsy for the highly suspicious calcifications in the left breast also recommended as detailed in the mammography report.    ASSESSMENT & PLAN:  Marilea W Poppen is a 46 y.o. perimenopausal female with a history of Chronic ITP and MTHFR mutation, presented with a palpable left axillary adenopathy.  1. Left breast cancer with metastasized to axillary lymph nodes, cTxN1Mx, ER+/PR +, HER2 - -I reviewed and discussed her diagnostic mammogram/US and Biopsy results.  -The left axillary lymph node biopsy is consistent with breast primary, ER/PR positive, HER-2 negative, and histology is consistent with lobular carcinoma. She has 6 abnormal axillary lymph nodes on ultrasound, 3 of them are palpable and mobile. -Unfortunately the primary breast tumor is not identified on the mammogram and ultrasound, the calcifications in the left breast showed a DCIS. -She was not able tolerate breast MRI due to the reaction of nausea and warm feeling. She is willing to try again with premedication of steroids, Benadryl and Tylenol. -I recommend we get a staging scan with PET to ruled out distant metastasis, giving the large number of positive lymph nodes on ultrasound. If her insurance denies PET, will obtain CT of the chest, abdomen and pelvis with contrast and bone scan. --I recommended neoadjuvant chemotherapy due to multiple lymph nodes involved. I recommend standard dose dense Adriamycin and Cytoxan every 2 weeks, for 4 cycles, followed by weekly Taxol or Abraxane for 12 weeks. -I will obtain a interim ultrasound or MRI to a value her response after 2-3 months of chemotherapy, to determine if she should continue chemotherapy.  --Chemotherapy consent: Side effects including but does not not limited to, fatigue, nausea, vomiting, diarrhea, hair loss, neuropathy, fluid retention, renal and kidney dysfunction, neutropenic fever,  needed for blood transfusion, bleeding, congestive heart failure, risk of secondary cancers especially leukemia and MDS, were discussed with patient in great detail. She agrees to proceed. She will need to attend a chemo class.  -The goal of therapy is curative -We'll arrange echocardiogram, chemotherapy class and a port placement -If we are not able to identify her primary tumor in the breast, she would need left mastectomy and axillary lymph node dissection. She   was seen by Dr. Barry Dienes today. -Due to the multiple positive axillary lymph node, she would also benefit from adjuvant radiation. She was seen by Dr. Lisbeth Renshaw today. -Given the ER/PR positive disease, I would strongly recommend adjuvant antiestrogen therapy tamoxifen, or aromatase inhibitor plus overexpression after she completes radiation. -We also discussed breast cancer surveillance after she completes treatment.  2. Chronic ITP -Discovered in her Last pregnancy, with platelet count dropped to between 50,000 and 60,000, recovered, her platelet counts is around 120-150K lately  -She does not follow up with hematologist  -I'm concerned her platelet counts will drop significantly with chemotherapy, she may need steroids or additional treatment for her ITP.  3. Genetics -She has history of MTHFR mutation, which was discovered during her workup before multiple miscarriage. -Due to her young age and extensive family history of cancer I will refer her to genetics for testing, to ruled out inheritable breast cancer syndrome.  4. Coping/Support -She is quite overwhelmed with her cancer diagnosis.  -I will refer her to our social worker if she needs it.    PLAN:  -Refer to Genetics  -Breast MRI w and wo contrast with premedication steroids, Benadryl and Tylenol  -PET or CT+BONE SCAN for staging  -Echo within the next two weeks -chemo class, port placement  -Labs and F/u and first dose AC in 2 weeks    Orders Placed This Encounter    Procedures  . CT ABDOMEN PELVIS W CONTRAST    Standing Status:   Future    Standing Expiration Date:   03/29/2018    Order Specific Question:   Reason for exam:    Answer:   staging- new breast cancer    Order Specific Question:   Is the patient pregnant?    Answer:   No    Order Specific Question:   Preferred imaging location?    Answer:   [facility] CONTRAST    Standing Status:   Future    Standing Expiration Date:   03/29/2018    Order Specific Question:   Reason for exam:    Answer:   Staging    Order Specific Question:   Is the patient pregnant?    Answer:   No    Order Specific Question:   Preferred imaging location?    Answer:   [facility]  . MR BREAST BILATERAL W WO CONTRAST    Standing Status:   Future    Standing Expiration Date:   05/30/2018    Order Specific Question:   If indicated for the ordered procedure, I authorize the administration of contrast media per Radiology protocol    Answer:   Yes    Order Specific Question:   Reason for Exam (SYMPTOM  OR DIAGNOSIS REQUIRED)    Answer:   new breast cancer    Order Specific Question:   Preferred imaging location?    Answer:   [facility] (table limit-550lbs)    Order Specific Question:   What is the patient's sedation requirement?    Answer:   Anti-anxiety    Order Specific Question:   Does the patient have a pacemaker or implanted devices?    Answer:   No  . NM Bone Scan Whole Body    Standing Status:   Future    Standing Expiration Date:   03/29/2018    Order Specific Question:   Reason for Exam (SYMPTOM  OR DIAGNOSIS REQUIRED)    Answer:  new breast cancer- staging- 6 LN    Order Specific Question:   Is the patient pregnant?    Answer:   No    Order Specific Question:   Preferred imaging location?    Answer:   [facility]  . Ambulatory referral to Genetics    Referral Priority:   Routine    Referral Type:   Consultation    Referral Reason:   Specialty Services  Required    Number of Visits Requested:   1  . ECHOCARDIOGRAM COMPLETE    Standing Status:   Future    Standing Expiration Date:   06/29/2018    Order Specific Question:   Where should this test be performed    Answer:   [facility]    Order Specific Question:   Complete or Limited study?    Answer:   Complete    Order Specific Question:   Perflutren DEFINITY (image enhancing agent) should be administered unless hypersensitivity or allergy exist    Answer:   Administer Perflutren    Order Specific Question:   Expected Date:    Answer:   1 week    Order Specific Question:   Reason for exam-Echo    Answer:   Pre-operative cardiovascular examination V72.81 / Z01.810    All questions were answered. The patient knows to call the clinic with any problems, questions or concerns. I spent 60 minutes counseling the patient face to face. The total time spent in the appointment was 70 minutes and more than 50% was on counseling.     Feng, Yan, MD 03/29/2017 6:11 PM   This document serves as a record of services personally performed by Yan Feng, MD. It was created on her behalf by Amoya Bennett, a trained medical scribe. The creation of this record is based on the scribe's personal observations and the provider's statements to them. This document has been checked and approved by the attending provider.  

## 2017-03-29 NOTE — Patient Instructions (Signed)

## 2017-03-30 ENCOUNTER — Telehealth (HOSPITAL_COMMUNITY): Payer: Self-pay | Admitting: Vascular Surgery

## 2017-03-30 ENCOUNTER — Telehealth: Payer: Self-pay | Admitting: *Deleted

## 2017-03-30 NOTE — Telephone Encounter (Signed)
Spoke to Madison Savage regarding [place] from 7.25.18. Madison Savage denies questions regarding dx. Discussed several treatment options. Madison Savage relate she would like to have mastectomy with ALND and reconstruction. Discussed Madison Savage wishes with physician team.  Per Dr. Barry Dienes scheduled appt for Madison Savage to see Dr. Marla Roe on 03/31/17 at 1:15pm.

## 2017-03-30 NOTE — Telephone Encounter (Signed)
Left pt message giving NP brst appt 8/9 echo @ 1 and db @ 2, asked pt to call back to confirm appt

## 2017-03-31 ENCOUNTER — Telehealth: Payer: Self-pay | Admitting: Hematology

## 2017-03-31 ENCOUNTER — Telehealth: Payer: Self-pay | Admitting: *Deleted

## 2017-03-31 DIAGNOSIS — C50812 Malignant neoplasm of overlapping sites of left female breast: Secondary | ICD-10-CM | POA: Diagnosis not present

## 2017-03-31 DIAGNOSIS — Z170 Estrogen receptor positive status [ER+]: Secondary | ICD-10-CM | POA: Diagnosis not present

## 2017-03-31 NOTE — Telephone Encounter (Signed)
Spoke to patient regarding the appointments on 8/8. [facility] will call regarding a scan. Patient transferred to breast navigator regarding clarity around chemo.    Called again and left message for patient regarding calling [facility] for breast MRI.

## 2017-03-31 NOTE — Telephone Encounter (Signed)
Pt called after appt with Dr. Marla Roe. Pt wishes to proceed with surgery 1st and have bilateral mast with reconstruction. Msg sent to physician team. Pt will see Dr. Barry Dienes on 7/30 to discuss final surgical plans.

## 2017-04-03 ENCOUNTER — Ambulatory Visit (HOSPITAL_COMMUNITY): Payer: BLUE CROSS/BLUE SHIELD

## 2017-04-03 ENCOUNTER — Other Ambulatory Visit: Payer: Self-pay | Admitting: General Surgery

## 2017-04-03 DIAGNOSIS — C773 Secondary and unspecified malignant neoplasm of axilla and upper limb lymph nodes: Secondary | ICD-10-CM | POA: Diagnosis not present

## 2017-04-03 DIAGNOSIS — C50912 Malignant neoplasm of unspecified site of left female breast: Secondary | ICD-10-CM | POA: Diagnosis not present

## 2017-04-04 ENCOUNTER — Encounter (HOSPITAL_COMMUNITY): Payer: BLUE CROSS/BLUE SHIELD

## 2017-04-04 ENCOUNTER — Other Ambulatory Visit (HOSPITAL_COMMUNITY): Payer: BLUE CROSS/BLUE SHIELD

## 2017-04-04 DIAGNOSIS — N6311 Unspecified lump in the right breast, upper outer quadrant: Secondary | ICD-10-CM | POA: Diagnosis not present

## 2017-04-04 DIAGNOSIS — C50912 Malignant neoplasm of unspecified site of left female breast: Secondary | ICD-10-CM | POA: Diagnosis not present

## 2017-04-05 ENCOUNTER — Other Ambulatory Visit: Payer: BLUE CROSS/BLUE SHIELD

## 2017-04-05 DIAGNOSIS — Z170 Estrogen receptor positive status [ER+]: Secondary | ICD-10-CM | POA: Diagnosis not present

## 2017-04-05 DIAGNOSIS — C50812 Malignant neoplasm of overlapping sites of left female breast: Secondary | ICD-10-CM | POA: Diagnosis not present

## 2017-04-05 DIAGNOSIS — D0512 Intraductal carcinoma in situ of left breast: Secondary | ICD-10-CM | POA: Diagnosis not present

## 2017-04-05 DIAGNOSIS — C773 Secondary and unspecified malignant neoplasm of axilla and upper limb lymph nodes: Secondary | ICD-10-CM | POA: Diagnosis not present

## 2017-04-05 DIAGNOSIS — C50811 Malignant neoplasm of overlapping sites of right female breast: Secondary | ICD-10-CM | POA: Diagnosis not present

## 2017-04-05 DIAGNOSIS — C50912 Malignant neoplasm of unspecified site of left female breast: Secondary | ICD-10-CM | POA: Diagnosis not present

## 2017-04-06 ENCOUNTER — Encounter: Payer: Self-pay | Admitting: *Deleted

## 2017-04-07 ENCOUNTER — Telehealth: Payer: Self-pay | Admitting: *Deleted

## 2017-04-07 ENCOUNTER — Ambulatory Visit: Payer: Self-pay | Admitting: Plastic Surgery

## 2017-04-07 ENCOUNTER — Encounter (HOSPITAL_COMMUNITY)
Admission: RE | Admit: 2017-04-07 | Discharge: 2017-04-07 | Disposition: A | Discharge status: Home / Self Care | Payer: BLUE CROSS/BLUE SHIELD | Source: Ambulatory Visit | Attending: Hematology | Admitting: Hematology

## 2017-04-07 ENCOUNTER — Ambulatory Visit (HOSPITAL_COMMUNITY)
Admission: RE | Admit: 2017-04-07 | Discharge: 2017-04-07 | Disposition: A | Discharge status: Home / Self Care | Payer: BLUE CROSS/BLUE SHIELD | Source: Ambulatory Visit | Attending: Hematology | Admitting: Hematology

## 2017-04-07 DIAGNOSIS — D259 Leiomyoma of uterus, unspecified: Secondary | ICD-10-CM | POA: Diagnosis not present

## 2017-04-07 DIAGNOSIS — C50912 Malignant neoplasm of unspecified site of left female breast: Secondary | ICD-10-CM | POA: Insufficient documentation

## 2017-04-07 DIAGNOSIS — C773 Secondary and unspecified malignant neoplasm of axilla and upper limb lymph nodes: Secondary | ICD-10-CM | POA: Insufficient documentation

## 2017-04-07 DIAGNOSIS — C50919 Malignant neoplasm of unspecified site of unspecified female breast: Secondary | ICD-10-CM

## 2017-04-07 DIAGNOSIS — R590 Localized enlarged lymph nodes: Secondary | ICD-10-CM | POA: Insufficient documentation

## 2017-04-07 IMAGING — CT CT CHEST W/ CM
2 of 5 series · 12 of 36 positions shown, 15 images · IV contrast (ISOVUE 300)
Comparison: None.

CLINICAL DATA: Breast cancer with metastasis to the left axillary
lymph nodes.

EXAM:
CT CHEST, ABDOMEN, AND PELVIS WITH CONTRAST
TECHNIQUE: Multidetector CT imaging of the chest, abdomen and pelvis was
performed following the standard protocol during bolus
administration of intravenous contrast.
CONTRAST:  100mL [FX] IOPAMIDOL ([FX]) INJECTION 61%

[Series 2: cap with · axial · 0.64mm/px · z∈[-544,-49]mm · 9 of 125 slices shown, 12 images]
[im 13/125  mediastinal]
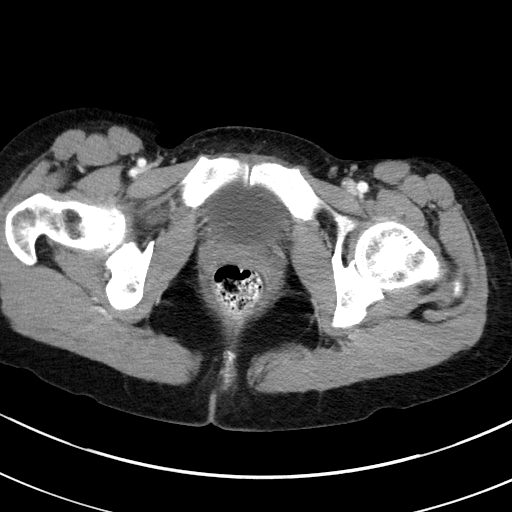
[im 13/125  lung]
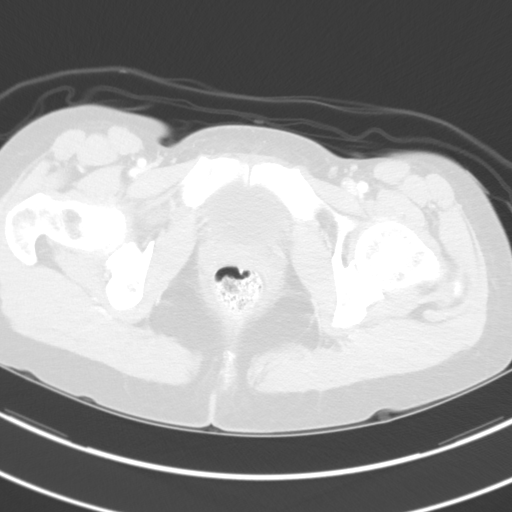
[im 25/125  lung]
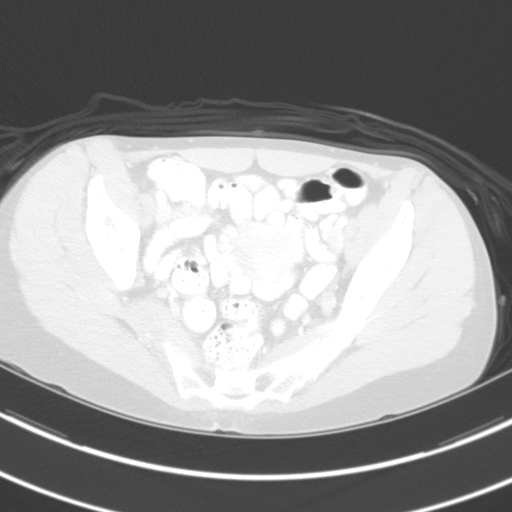
[im 38/125  lung]
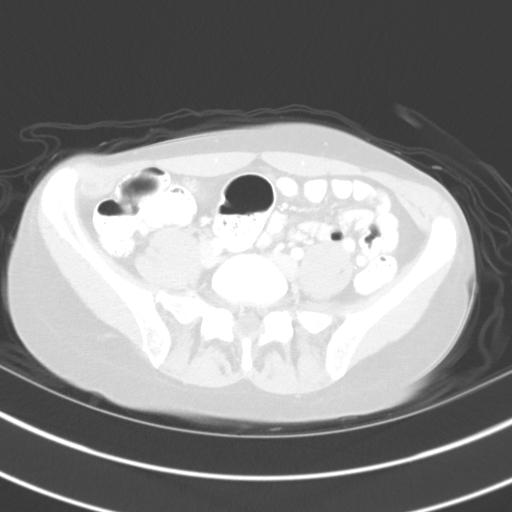
[im 50/125  lung]
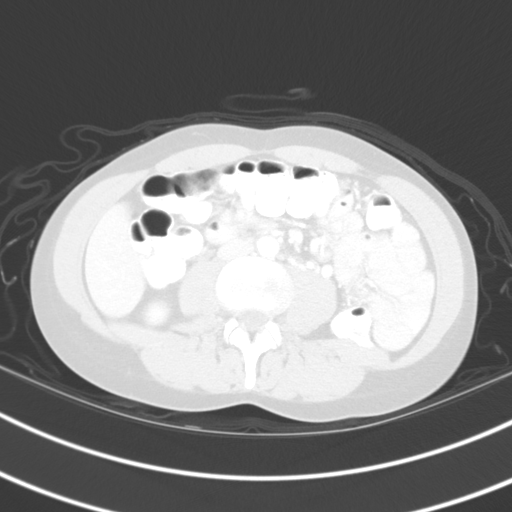
[im 63/125  mediastinal]
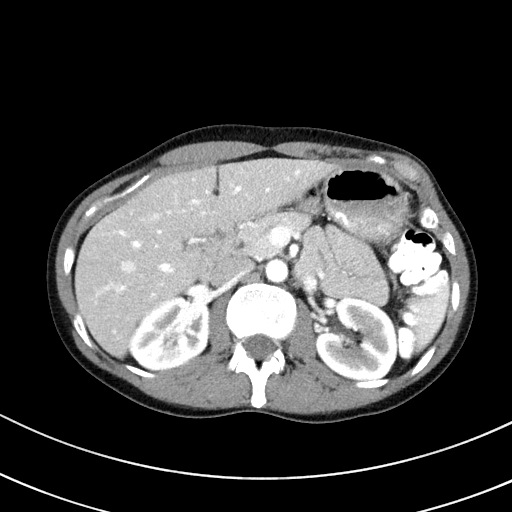
[im 63/125  lung]
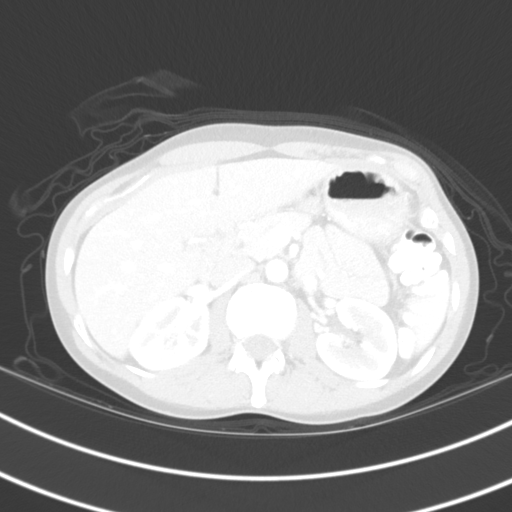
[im 75/125  lung]
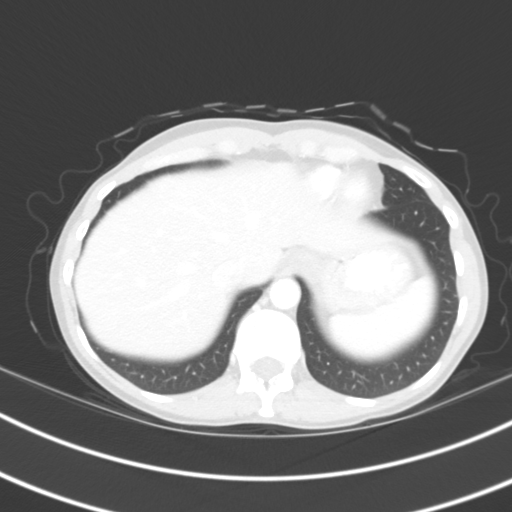
[im 87/125  lung]
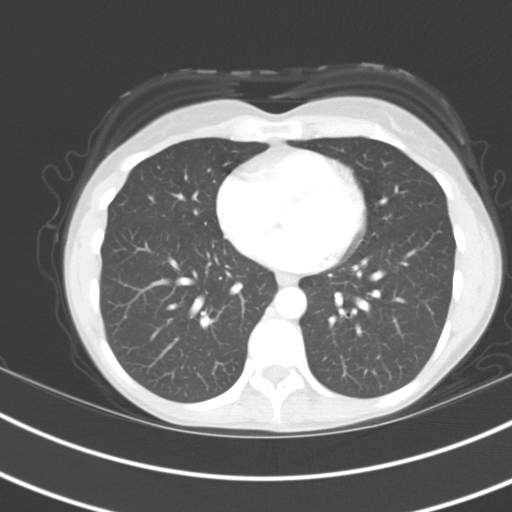
[im 100/125  lung]
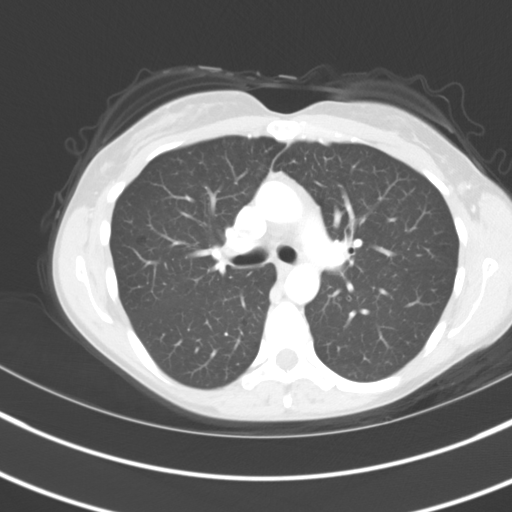
[im 112/125  mediastinal]
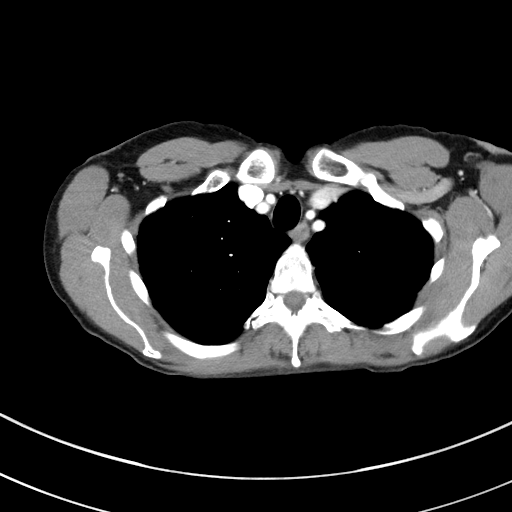
[im 112/125  lung]
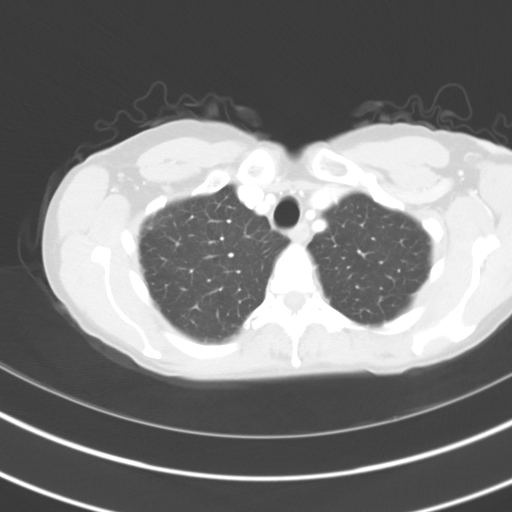

[Series 5: coronals · coronal · 0.65mm/px · 3 of 120 slices shown]
[im 24/120  lung]
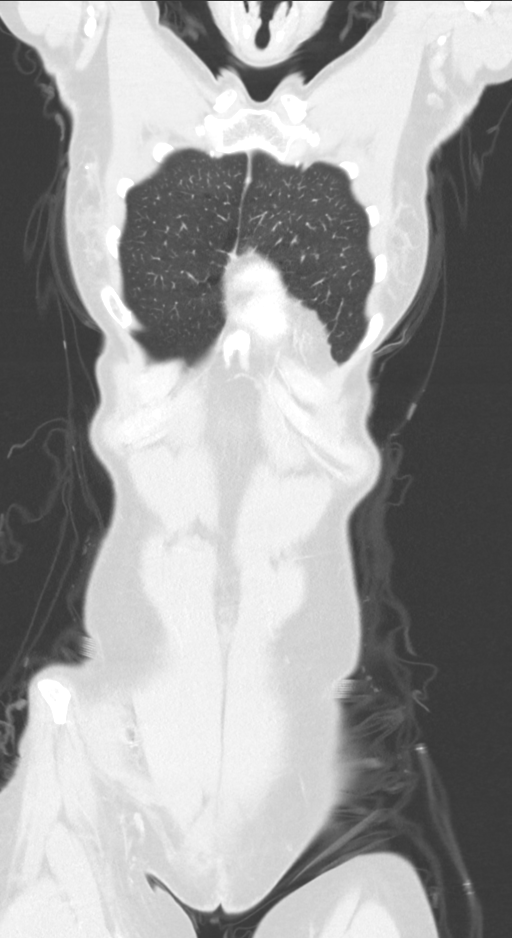
[im 48/120  lung]
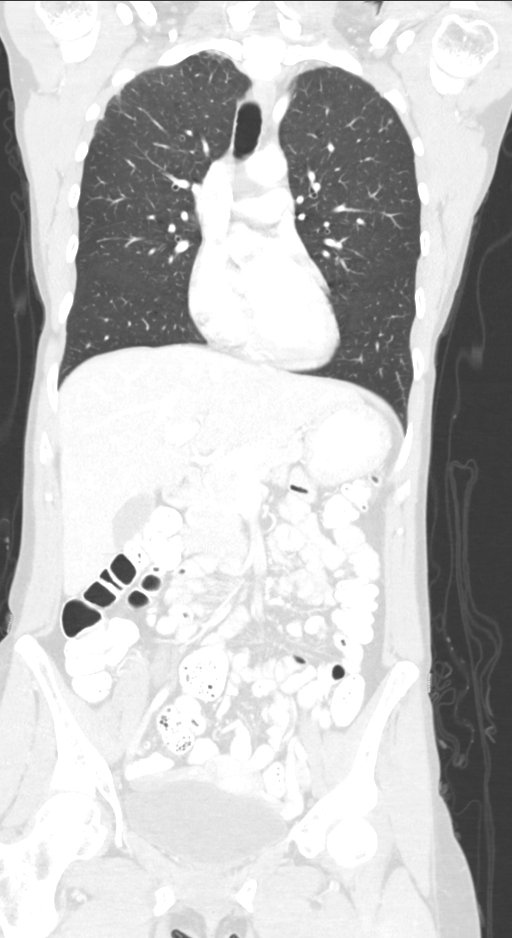
[im 72/120  lung]
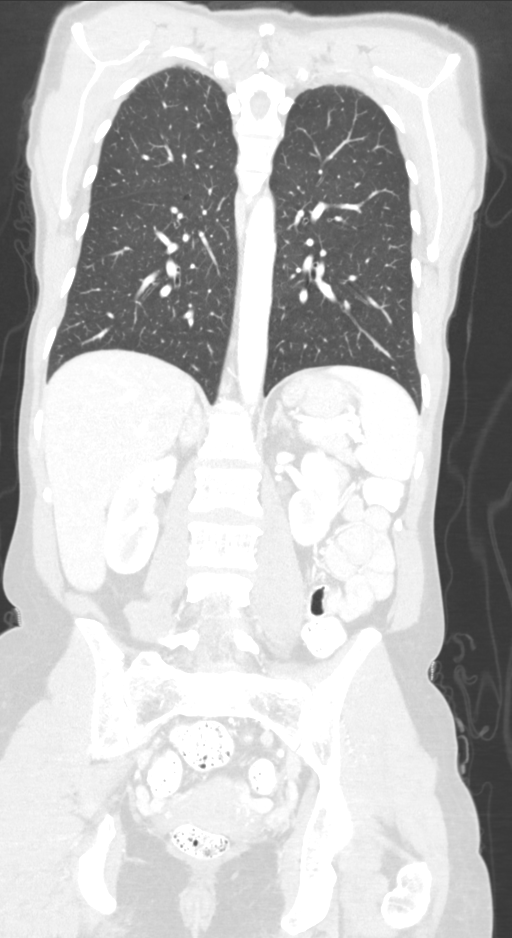

[12 of 36 positions shown; findings below may reference images not displayed]

FINDINGS: CT CHEST FINDINGS

Cardiovascular: The heart size appears normal. No pericardial
effusion identified.

Mediastinum/Nodes: No enlarged mediastinal or hilar lymph nodes.
Thyroid gland, trachea, and esophagus demonstrate no significant
findings. Several prominent lymph nodes identified within the left
axilla. Index 8 mm left axillary lymph node is identified, image 20
of series 2.

Lungs/Pleura: No pleural effusion identified. No suspicious
pulmonary nodule or mass.

Musculoskeletal: No aggressive lytic or sclerotic bone lesion.

CT ABDOMEN PELVIS FINDINGS

Hepatobiliary: No focal liver abnormality is seen. No gallstones,
gallbladder wall thickening, or biliary dilatation.

Pancreas: Unremarkable. No pancreatic ductal dilatation or
surrounding inflammatory changes.

Spleen: Normal in size without focal abnormality.

Adrenals/Urinary Tract: Adrenal glands are unremarkable. Kidneys are
normal, without renal calculi, focal lesion, or hydronephrosis.
Bladder is unremarkable.

Stomach/Bowel: Stomach is within normal limits. Appendix appears
normal. No evidence of bowel wall thickening, distention, or
inflammatory changes.

Vascular/Lymphatic: No significant vascular findings are present. No
enlarged abdominal or pelvic lymph nodes.

Reproductive: Small fibroid identified within the left side of
uterus. No adnexal mass.

Other: No abdominal wall hernia or abnormality. No abdominopelvic
ascites.

Musculoskeletal: No aggressive lytic or sclerotic bone lesions.
IMPRESSION: 1. Several prominent left axillary lymph nodes which may correspond
to biopsy proven axillary lymph node metastasis.
2. No thoracic adenopathy identified or evidence of distant
metastatic disease.

## 2017-04-07 IMAGING — NM NM BONE WHOLE BODY
2 series · 2 of 2 positions shown · non-contrast
Comparison: None.

CLINICAL DATA: 46-year-old female with new diagnosis of breast
cancer. Evaluate for osseous metastatic disease.

EXAM:
NUCLEAR MEDICINE WHOLE BODY BONE SCAN
TECHNIQUE: Whole body anterior and posterior images were obtained approximately
3 hours after intravenous injection of radiopharmaceutical.
RADIOPHARMACEUTICALS:  20 mCi [YL] MDP IV

[Series 1: whole body · 2.66mm/px · 1 of 1 slices shown (1 of 2)]
[im 1/1]
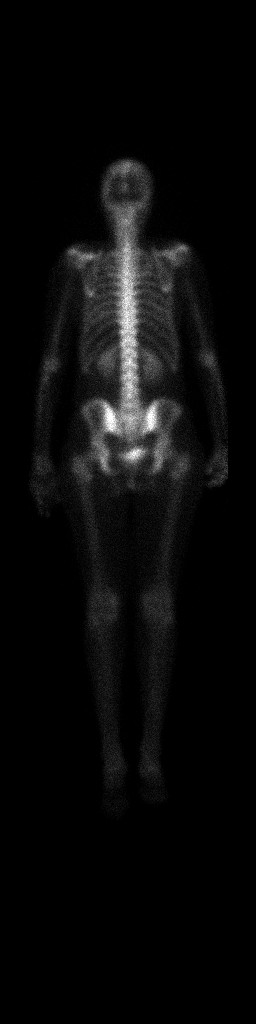

[Series 1: whole body · 2.66mm/px · 1 of 1 slices shown (2 of 2)]
[im 1/1]
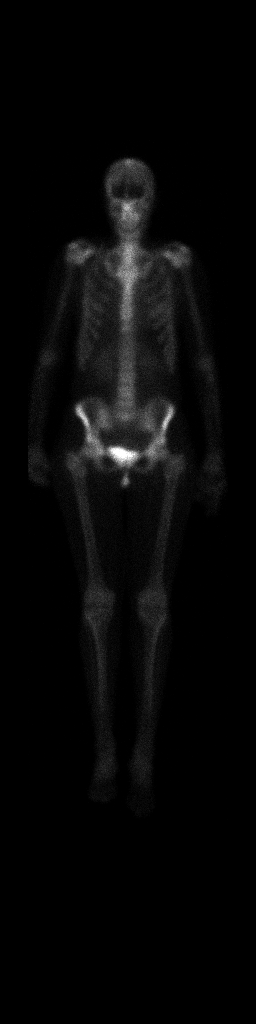

[2 of 2 positions shown; findings below may reference images not displayed]

FINDINGS: Normal distribution of radiotracer throughout the skeleton, kidneys
and bladder. No foci of increased radiotracer uptake to suggest
osseous metastatic disease.
IMPRESSION: Negative for evidence of osseous metastatic disease.

## 2017-04-07 MED ORDER — IOPAMIDOL (ISOVUE-300) INJECTION 61%
INTRAVENOUS | Status: AC
  Filled 2017-04-07: qty 100

## 2017-04-07 MED ORDER — TECHNETIUM TC 99M MEDRONATE IV KIT
25.0000 | PACK | Freq: Once | INTRAVENOUS | Status: AC | PRN
  Administered 2017-04-07: 25 via INTRAVENOUS

## 2017-04-07 MED ORDER — IOPAMIDOL (ISOVUE-300) INJECTION 61%
100.0000 mL | Freq: Once | INTRAVENOUS | Status: AC | PRN
  Administered 2017-04-07: 100 mL via INTRAVENOUS

## 2017-04-07 NOTE — Telephone Encounter (Signed)
  Oncology Nurse Navigator Documentation  Navigator Location: [facility] (04/07/17 1300)   )Navigator Encounter Type: Telephone (04/07/17 1300) Telephone: Outgoing Call;Diagnostic Results (04/07/17 1300)  Discussed Scan results and next step. Surgery is scheduled for 8/23                                                Time Spent with Patient: 15 (04/07/17 1300)

## 2017-04-10 ENCOUNTER — Ambulatory Visit (HOSPITAL_BASED_OUTPATIENT_CLINIC_OR_DEPARTMENT_OTHER): Payer: BLUE CROSS/BLUE SHIELD | Admitting: Genetic Counselor

## 2017-04-10 ENCOUNTER — Encounter: Payer: Self-pay | Admitting: Genetic Counselor

## 2017-04-10 ENCOUNTER — Other Ambulatory Visit: Payer: BLUE CROSS/BLUE SHIELD

## 2017-04-10 DIAGNOSIS — Z803 Family history of malignant neoplasm of breast: Secondary | ICD-10-CM

## 2017-04-10 DIAGNOSIS — C773 Secondary and unspecified malignant neoplasm of axilla and upper limb lymph nodes: Secondary | ICD-10-CM

## 2017-04-10 DIAGNOSIS — Z8041 Family history of malignant neoplasm of ovary: Secondary | ICD-10-CM | POA: Insufficient documentation

## 2017-04-10 DIAGNOSIS — C50912 Malignant neoplasm of unspecified site of left female breast: Secondary | ICD-10-CM

## 2017-04-10 NOTE — Progress Notes (Signed)
REFERRING PROVIDER: Dorena Cookey, MD [place], [place] [address]  PRIMARY PROVIDER:  Dorena Cookey, MD  PRIMARY REASON FOR VISIT:  1. Breast cancer metastasized to axillary lymph node, left ([place])   2. Family history of breast cancer   3. Family history of ovarian cancer      HISTORY OF PRESENT ILLNESS:   [name], a 46 y.o. female, was seen for a [facility] cancer genetics consultation at the request of Dr. Sherren Mocha due to a personal and family history of cancer.  [name] presents to clinic today to discuss the possibility of a hereditary predisposition to cancer, genetic testing, and to further clarify her future cancer risks, as well as potential cancer risks for family members.   In July 2018, at the age of 65, Ms. [name] was diagnosed with invasive ductal carcinoma of the left breast. This will be treated with double mastectomy and reconstruction, possibly chemotherapy and radiation.     CANCER HISTORY:  Oncology History   Cancer Staging Breast cancer metastasized to axillary lymph node, left ([place]) Staging form: Breast, AJCC 8th Edition - Clinical stage from 03/22/2017: Stage Unknown (cTX, cN1, cM0, GX, ER: Positive, PR: Positive, HER2: Negative) - Signed by [name], MD on 03/29/2017 \     Breast cancer metastasized to axillary lymph node, left ([place])   03/21/2017 Mammogram    Diagnostic mammogram bilateral 03/21/17 IMPRESSION: INCOMPLETE - ADDITIONAL IMAGING EVALUATION NEEDED 1. The 0.6 cm oval mass in the right breast at 11 o'clock middle depth is indeterminate. An ultrasound is recommended.  2. The 0.5cm round focal asymmetry in the left breast central to the nipple middle depth is indeterminate. An ultrasound is recommended.  3. Ultrasound of the palpable abnormalities in the axilla and far left lateral breast is recommended.  4. Multiple clusters of pleomorphic calcifications in the left upper outer breast anterior depth spanning a 2.5 cm area are  highly suggestive of malignancy. A stereotactic biopsy is recommended.        03/21/2017 Imaging    US Breast bilateral 03/21/17 IMPRESSION: HIGHLY SUGGESTIVE OF MALIGNANCY 1. Six distinct abnormal left axillary nodes, 2 of which correspond to the palpable lumps felt by the patient. Findings concerning for metastatic adenopathy. Ultrasound guided biopsy of one of these nodes is recommended.  2. Small 5 mm palpable, oval mass in the left breast 3:00 areolar margin is at an intermediate suspicion. Ultrasound guided biopsy is recommended.  3. Isoechoic 8 mm mass in the right breast 10:00 5 cm from the nipple is also an intermediate suspicion for malignancy, and ultrasound guided biopsy is recommended.  4. Stereotactic guided biopsy for the highly suspicious calcifications in the left breast also recommended as detailed in the mammography report.        03/22/2017 Initial Biopsy    Diagnosis 03/22/17 Lymph node, needle/core biopsy, left axillary node -METASTATIC CARCINOMA, SEE COMMENT Microscopic Comment      03/22/2017 Receptors her2    Lymph node biopsy ER 95% positive, strong staining, PR 5% positive, weak staining, HER-2 negative, with HER2/CEP17 ratio 1.23 and copy #3.95.      03/27/2017 Pathology Results    Diagnosis 03/27/17 1. Breast, left, needle core biopsy -FIBROADENOMATOID NODULE WITH CALCIFICATIONS 2.Breast, left, needle core biopsy -DUCTAL CARCINOMA IN SITU, HIGH GRADE -SEE COMMENT  3. Breast, right, needle, core biopsy -FIBROADENOMA -NO MALIGNANCY IDENTIFIED       03/29/2017 Initial Diagnosis    Breast cancer metastasized to axillary lymph node,  left ([place])       HORMONAL RISK FACTORS:  Menarche was at age 53.  First live birth at age 33.  OCP use for approximately 5-8 years.  Ovaries intact: yes.  Hysterectomy: no.  Menopausal status: premenopausal.  HRT use: 0 years. Colonoscopy: yes; at 46 years of age for IBS. Mammogram within the last year: yes. Number of  breast biopsies: 1. Up to date with pelvic exams:  yes. Any excessive radiation exposure in the past:  no  Past Medical History:  Diagnosis Date  . Chronic ITP (idiopathic thrombocytopenia) (H[facility])   . Family history of breast cancer   . Family history of ovarian cancer   . MTHFR mutation ([place])     History reviewed. No pertinent surgical history.  Social History   Social History  . Marital status: Married    Spouse name: N/A  . Number of children: 3  . Years of education: N/A   Occupational History  .  Unemployed   Social History Main Topics  . Smoking status: Former Smoker    Packs/day: 0.50    Years: 20.00    Types: Cigarettes    Quit date: 03/05/2017  . Smokeless tobacco: Never Used  . Alcohol use No  . Drug use: No  . Sexual activity: Yes    Partners: Male    Birth control/ protection: None   Other Topics Concern  . None   Social History Narrative   Stay at home mom   3 children ages 9, 52, 45     FAMILY HISTORY:  We obtained a detailed, 4-generation family history.  Significant diagnoses are listed below: Family History  Problem Relation Age of Onset  . Breast cancer Paternal Aunt 65  . Ovarian cancer Paternal Aunt 76  . Cancer Paternal Grandmother        breast cancer   . Cancer Maternal Aunt        lymphoma   . Cancer Paternal Grandfather        throat cancer   . Cancer Paternal Aunt        lung cancer  . Cirrhosis Father   . AAA (abdominal aortic aneurysm) Maternal Grandfather   . Leukemia Paternal Aunt     The patient has three children, one son and two daughters, who are all cancer free.  She has one sister who is cancer free.  The patient's father died of cirrhosis of the liver due to alcoholism.  He had one brother and three sisters.  One sister had breast cancer at 85 and ovarian cancer at 54.  She reportedly had negative "genetic markers".  A second sister died of lung cancer from smoking, and the third sister died at 63 from leukemia.  The  patient's paternal grandmother died of breast cancer at 81, and her grandfather died of throat cancer.  The patient's mother is alive and well at 68.  She had a TAH-BSO at 38.  She had three sisters and two brothers.  One sister had lymphoma, and one died from suicide.  The remaining are healthy cancer free.  Both maternal grandparents are deceased.  The grandmother died of natural causes at 76 and the grandfather died of an AAA.  Patient's maternal ancestors are of [place], [place] and [place] descent, and paternal ancestors are of [place], [place], [place] and [place] descent. There is no reported Ashkenazi Jewish ancestry. There is no known consanguinity.  GENETIC COUNSELING ASSESSMENT: SHAMAINE MULKERN is a 46 y.o. female with  a personal and family history of breast cancer which is somewhat suggestive of a hereditary cancer syndrome and predisposition to cancer. We, therefore, discussed and recommended the following at today's visit.   DISCUSSION: We discussed that about 5-10% of breast cancer is hereditary with most cases due to BRCA mutations.  Other genes are also associated with hereditary breast cancer syndromes, and therefore we will consider a larger panel.  We reviewed the characteristics, features and inheritance patterns of hereditary cancer syndromes. We also discussed genetic testing, including the appropriate family members to test, the process of testing, insurance coverage and turn-around-time for results. We discussed the implications of a negative, positive and/or variant of uncertain significant result. In order to get genetic test results in a timely manner so that Madison Savage can use these genetic test results for surgical decisions, we recommended Madison Savage pursue genetic testing for the 9-gene STAT panel. The STAT Breast cancer panel offered by [facility] includes sequencing and rearrangement analysis for the following 9 genes:  ATM, BRCA1, BRCA2, CDH1, CHEK2, PALB2, PTEN, STK11 and  TP53.   If this test is negative, we then recommend Madison Savage pursue reflex genetic testing to the Common hereditary cancer gene panel. The Hereditary Gene Panel offered by [facility] includes sequencing and/or deletion duplication testing of the following 46 genes: APC, ATM, AXIN2, BARD1, BMPR1A, BRCA1, BRCA2, BRIP1, CDH1, CDKN2A (p14ARF), CDKN2A (p16INK4a), CHEK2, CTNNA1, DICER1, EPCAM (Deletion/duplication testing only), GREM1 (promoter region deletion/duplication testing only), KIT, MEN1, MLH1, MSH2, MSH3, MSH6, MUTYH, NBN, NF1, NHTL1, PALB2, PDGFRA, PMS2, POLD1, POLE, PTEN, RAD50, RAD51C, RAD51D, SDHB, SDHC, SDHD, SMAD4, SMARCA4. STK11, TP53, TSC1, TSC2, and VHL.  The following genes were evaluated for sequence changes only: SDHA and HOXB13 c.251G>A variant only.    Based on Madison Savage's personal and family history of cancer, she meets medical criteria for genetic testing. Despite that she meets criteria, she may still have an out of pocket cost. We discussed that if her out of pocket cost for testing is over $100, the laboratory will call and confirm whether she wants to proceed with testing.  If the out of pocket cost of testing is less than $100 she will be billed by the genetic testing laboratory.   PLAN: After considering the risks, benefits, and limitations, Madison Savage  provided informed consent to pursue genetic testing and the blood sample was sent to [facility] for analysis of the STAT panel, with a reflex to the Common Hereditary Cancer panel. Results should be available within approximately 2-3 weeks' time, at which point they will be disclosed by telephone to Ms. Moch, as will any additional recommendations warranted by these results. Ms. Dupuis will receive a summary of her genetic counseling visit and a copy of her results once available. This information will also be available in Epic. We encouraged Madison Savage to remain in contact with cancer genetics annually so that we can  continuously update the family history and inform her of any changes in cancer genetics and testing that may be of benefit for her family. Madison Savage questions were answered to her satisfaction today. Our contact information was provided should additional questions or concerns arise.  Based on Madison Savage's family history, we recommended her aunt Nevin Bloodgood, who was diagnosed with breast cancer at age 28 and ovarian cancer at 34, have updated genetic counseling and testing. Madison Savage will let us know if we can be of any assistance in coordinating genetic counseling and/or testing for this family member.  Lastly, we encouraged Madison Savage to remain in contact with cancer genetics annually so that we can continuously update the family history and inform her of any changes in cancer genetics and testing that may be of benefit for this family.   Ms.  Savage questions were answered to her satisfaction today. Our contact information was provided should additional questions or concerns arise. Thank you for the referral and allowing [place] to share in the care of your patient.   Bryna Razavi P. Florene Glen, [place], [facility] Certified Genetic Counselor [email or web address] phone: [phone]  The patient was seen for a total of 90 minutes in face-to-face genetic counseling.  This patient was discussed with Drs. Magrinat, Lindi Adie and/or Burr Medico who agrees with the above.    _______________________________________________________________________ For Office Staff:  Number of people involved in session: 1 Was an Intern/ student involved with case: no

## 2017-04-12 ENCOUNTER — Ambulatory Visit: Payer: BLUE CROSS/BLUE SHIELD | Admitting: Hematology

## 2017-04-12 ENCOUNTER — Ambulatory Visit: Payer: BLUE CROSS/BLUE SHIELD

## 2017-04-13 ENCOUNTER — Ambulatory Visit (HOSPITAL_COMMUNITY): Payer: BLUE CROSS/BLUE SHIELD | Admitting: Internal Medicine

## 2017-04-13 ENCOUNTER — Other Ambulatory Visit (HOSPITAL_COMMUNITY): Payer: BLUE CROSS/BLUE SHIELD

## 2017-04-14 ENCOUNTER — Telehealth: Payer: Self-pay | Admitting: Hematology

## 2017-04-14 NOTE — Telephone Encounter (Signed)
Spoke with patient regarding her appointment in September.

## 2017-04-17 ENCOUNTER — Encounter: Payer: Self-pay | Admitting: Genetic Counselor

## 2017-04-17 ENCOUNTER — Telehealth: Payer: Self-pay | Admitting: Genetic Counselor

## 2017-04-17 DIAGNOSIS — Z1509 Genetic susceptibility to other malignant neoplasm: Secondary | ICD-10-CM

## 2017-04-17 DIAGNOSIS — Z1501 Genetic susceptibility to malignant neoplasm of breast: Secondary | ICD-10-CM | POA: Insufficient documentation

## 2017-04-17 DIAGNOSIS — Z1379 Encounter for other screening for genetic and chromosomal anomalies: Secondary | ICD-10-CM | POA: Insufficient documentation

## 2017-04-17 NOTE — Telephone Encounter (Signed)
Revealed that a BRCA2 mutation was identified on the STAT panel.  She will come in to discuss on Wed at 11 AM.

## 2017-04-19 ENCOUNTER — Ambulatory Visit (HOSPITAL_BASED_OUTPATIENT_CLINIC_OR_DEPARTMENT_OTHER): Payer: BLUE CROSS/BLUE SHIELD | Admitting: Genetic Counselor

## 2017-04-19 DIAGNOSIS — Z1501 Genetic susceptibility to malignant neoplasm of breast: Secondary | ICD-10-CM

## 2017-04-19 DIAGNOSIS — Z1509 Genetic susceptibility to other malignant neoplasm: Secondary | ICD-10-CM

## 2017-04-19 DIAGNOSIS — Z803 Family history of malignant neoplasm of breast: Secondary | ICD-10-CM

## 2017-04-19 DIAGNOSIS — Z1379 Encounter for other screening for genetic and chromosomal anomalies: Secondary | ICD-10-CM

## 2017-04-19 DIAGNOSIS — C50912 Malignant neoplasm of unspecified site of left female breast: Secondary | ICD-10-CM

## 2017-04-19 DIAGNOSIS — Z8041 Family history of malignant neoplasm of ovary: Secondary | ICD-10-CM

## 2017-04-19 DIAGNOSIS — C773 Secondary and unspecified malignant neoplasm of axilla and upper limb lymph nodes: Secondary | ICD-10-CM

## 2017-04-19 NOTE — Progress Notes (Signed)
GENETIC TEST RESULTS   Patient Name: Madison Savage Patient Age: 46 y.o. Encounter Date: 04/19/2017  Referring Provider: [name], MD    Ms. Vandezande was seen in the [place] clinic on April 19, 2017 due to a personal and family history of cancer and concern regarding a hereditary predisposition to cancer in the family. Please refer to the prior Genetics clinic note for more information regarding Ms. Luten's medical and family histories and our assessment at the time.   FAMILY HISTORY:  We obtained a detailed, 4-generation family history.  Significant diagnoses are listed below: Family History  Problem Relation Age of Onset  . Breast cancer Paternal Aunt 82  . Ovarian cancer Paternal Aunt 74  . Cancer Paternal Grandmother        breast cancer   . Cancer Maternal Aunt        lymphoma   . Cancer Paternal Grandfather        throat cancer   . Cancer Paternal Aunt        lung cancer  . Cirrhosis Father   . AAA (abdominal aortic aneurysm) Maternal Grandfather   . Leukemia Paternal Aunt     The patient has three children, one son and two daughters, who are all cancer free.  She has one sister who is cancer free.  The patient's father died of cirrhosis of the liver due to alcoholism.  He had one brother and three sisters.  One sister had breast cancer at 70 and ovarian cancer at 34.  She reportedly had negative "genetic markers".  A second sister died of lung cancer from smoking, and the third sister died at 11 from leukemia.  The patient's paternal grandmother died of breast cancer at 74, and her grandfather died of throat cancer.  The patient's mother is alive and well at 4.  She had a TAH-BSO at 93.  She had three sisters and two brothers.  One sister had lymphoma, and one died from suicide.  The remaining are healthy cancer free.  Both maternal grandparents are deceased.  The grandmother died of natural causes at 47 and the grandfather died of an AAA.  Patient's  maternal ancestors are of [place], [place] and [place] descent, and paternal ancestors are of [place], [place], [place] and [place] descent. There is no reported Ashkenazi Jewish ancestry. There is no known consanguinity.  GENETIC TESTING:  At the time of Ms. Hollomon's visit, we recommended she pursue genetic testing of the 9 gene STAT test. The genetic testing reported on April 17, 2017 through the STAT Cancer Panel offered by [facility] identified a single, heterozygous pathogenic gene mutation called BRCA2, [id number].    MEDICAL MANAGEMENT: Women who have a BRCA mutation have an increased risk for both breast and ovarian cancer.   Since Ms. Oviedo is planning on having a bilateral mastectomy, she will be taking the most effective option available to reduce breast cancer risk. At this time, we recommend she continue to follow healthcare management guidelines that have been provided to her by her overseeing providers.   To reduce the risk for ovarian cancer, we recommend Ms. Paszkiewicz have a prophylactic bilateral salpingo-oophorectomy when childbearing is completed, if planned. We discussed that screening with CA-125 blood tests and transvaginal ultrasounds can be done twice per year. However, these tests have not been shown to detect ovarian cancer at an early stage.  Ms. Arthurs will follow up with Dr. Ronita Hipps in regards to her ovarian cancer risk.    RISK  REDUCTION: There are several things that can be offered to individuals who are carriers for BRCA mutations that will reduce the risk for getting cancer.   The use of oral contraceptives can lower the risk for ovarian cancer, and, per case control studies, does not significantly increase the risk for breast cancer in BRCA patients. Case control studies have shown that oral contraceptives can lower the risk for ovarian cancer in women with BRCA mutations. Additionally, a more recent meta-analysis, including one cohort (n=3,181) and one case control  study (1,096 cases and 2,878 controls) also showed an inverse correlation between ovarian cancer and ever having used oral contraceptives (OR, 0.58; 95% CI = 0.46-0.73). Studies on oral contraceptives and breast cancer have been conflicting, with some studies suggesting that there is not an increased risk for breast cancer in BRCA mutation carriers, while others suggest that there could be a risk. That said, two meta-analysis studies have shown that there is not an increased risk for breast cancer with oral contraceptive use in BRCA1 and BRCA2 carriers.   In individuals who have a prophylactic bilateral salpingo-oophorectomy (BSO), the risk for breast cancer is reduced by up to 50%. It has been reported that short term hormone replacement therapy in women undergoing prophylactic BSO does not negate the reduction of breast cancer risk associated with surgery (1.2018 NCCN guidelines).  FAMILY MEMBERS: It is important that all of Ms. Delano's relatives (both men and women) know of the presence of this gene mutation. Site-specific genetic testing can sort out who in the family is at risk and who is not.   Ms. Campoverde children are have a 50% chance to have inherited this mutation. However, they are relatively young and this will not be of any consequence to them for several years. We do not test children because there is no risk to them until they are adults. We recommend they have genetic counseling and testing by the time they are in their early 20's.    Ms. Cortese sister has a 50% chance to have inherited this mutation. We recommend she have genetic testing for this same mutation, as identifying the presence of this mutation would allow her to also take advantage of risk-reducing measures.   SUPPORT AND RESOURCES: If Ms. Perazzo is interested in BRCA-specific information and support, there are two groups, Facing Our Risk (www.facingourrisk.com) and Bright Pink (www.brightpink.org) which some people  have found useful. They provide opportunities to speak with other individuals from high-risk families. To locate genetic counselors in other cities, visit the website of the [employer] (ArtistMovie.se) and Secretary/administrator for a Social worker by zip code.  We encouraged Ms. Raper to remain in contact with [place] on an annual basis so we can update her personal and family histories, and let her know of advances in cancer genetics that may benefit the family. Our contact number was provided. Ms. Kroner questions were answered to her satisfaction today, and she knows she is welcome to call anytime with additional questions.   Karen P. Florene Glen, [place], [facility] Certified Genetic Counselor [email or web address] phone: [phone]

## 2017-04-21 ENCOUNTER — Encounter (HOSPITAL_BASED_OUTPATIENT_CLINIC_OR_DEPARTMENT_OTHER): Payer: Self-pay | Admitting: *Deleted

## 2017-04-23 ENCOUNTER — Ambulatory Visit: Payer: Self-pay | Admitting: Plastic Surgery

## 2017-04-25 ENCOUNTER — Encounter (HOSPITAL_BASED_OUTPATIENT_CLINIC_OR_DEPARTMENT_OTHER)
Admission: RE | Admit: 2017-04-25 | Discharge: 2017-04-25 | Disposition: A | Discharge status: Home / Self Care | Payer: BLUE CROSS/BLUE SHIELD | Source: Ambulatory Visit | Attending: Plastic Surgery | Admitting: Plastic Surgery

## 2017-04-25 DIAGNOSIS — Z01812 Encounter for preprocedural laboratory examination: Secondary | ICD-10-CM

## 2017-04-25 DIAGNOSIS — C773 Secondary and unspecified malignant neoplasm of axilla and upper limb lymph nodes: Secondary | ICD-10-CM

## 2017-04-25 DIAGNOSIS — Z803 Family history of malignant neoplasm of breast: Secondary | ICD-10-CM | POA: Insufficient documentation

## 2017-04-25 DIAGNOSIS — Z87891 Personal history of nicotine dependence: Secondary | ICD-10-CM | POA: Diagnosis not present

## 2017-04-25 DIAGNOSIS — Z79899 Other long term (current) drug therapy: Secondary | ICD-10-CM | POA: Diagnosis not present

## 2017-04-25 DIAGNOSIS — Z8041 Family history of malignant neoplasm of ovary: Secondary | ICD-10-CM | POA: Diagnosis not present

## 2017-04-25 DIAGNOSIS — Z1501 Genetic susceptibility to malignant neoplasm of breast: Secondary | ICD-10-CM | POA: Diagnosis not present

## 2017-04-25 DIAGNOSIS — C50912 Malignant neoplasm of unspecified site of left female breast: Secondary | ICD-10-CM | POA: Insufficient documentation

## 2017-04-25 DIAGNOSIS — Z170 Estrogen receptor positive status [ER+]: Secondary | ICD-10-CM | POA: Diagnosis not present

## 2017-04-25 LAB — POCT PREGNANCY, URINE: Preg Test, Ur: NEGATIVE

## 2017-04-25 NOTE — Pre-Procedure Instructions (Signed)
Ensure pre surgery given and instructed to finish by 0530 DOS

## 2017-04-25 NOTE — H&P (Signed)
MAKEISHA JENTSCH 04/03/2017 4:30 PM Location: [facility] Patient #: [id number] DOB: [birth year] Married / Language: Cleophus Molt / Race: White Female   History of Present Illness [name] MD; 04/03/2017 7:01 PM) The patient is a 46 year old female who presents for a follow-up for Breast cancer. Pt is a 46 yo F diagnosed with left breast cancer july 2018. She palpated a mass in the upper outer quadrant of her breast and in the axillary tail. She immediately went to see her PCP who got her in for imaging at [facility]. She was seen to have 6 abnormal lymph nodes, but only calcifications in the breast were seen. She had not had a mammogram since she was 39. She had a 5 mm mass at 3 o'clock near the areola and 2.5 cm of calcs in the upper outer quadrant. 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 The highest LN visible was biopsied and was positive for metastatic lobular cancer, ER +/weak PR + and her 2 negative. Ki 67 was not available at this time. An MRI was attempted, but she felt very hot, had nausea, and chest tightness and was not able to complete MRI.   We were planning to do neoadjuvant chemo, staging studies, and make another attempt to get breast MRI, but patient would like to pursue bilateral mastectomy. She is here to discuss. She saw Dr. Marla Roe already to review. She is concerned about the lobular nature of the cancer, her young age, and the fact that the invasive cancer in the breast has not shown up on mammogram or ultrasound.     Allergies Malachy Moan, [place]; 04/03/2017 4:31 PM) Penicillins  Anaphylaxis. Clindamycin  Anaphylaxis.  Medication History Malachy Moan, [place]; 04/03/2017 4:32 PM) LORazepam (0.5MG  Tablet, Oral) Active. Cyanocobalamin (Oral) Specific strength unknown - Active. Echinacea (125MG  Tablet, Oral) Active. One-A-Day Womens Formula (Oral) Active. Medications Reconciled    Review of Systems [name] MD; 04/03/2017 7:01  PM) All other systems negative  Vitals Malachy Moan RMA; 04/03/2017 4:32 PM) 04/03/2017 4:32 PM Weight: 116.4 lb Height: 67in Body Surface Area: 1.61 m Body Mass Index: 18.23 kg/m  Temp.: 97.92F  Pulse: 66 (Regular)  BP: 108/80 (Sitting, Left Arm, Standard)       Physical Exam [name] MD; 04/03/2017 7:02 PM) General Mental Status-Alert. General Appearance-Consistent with stated age. Hydration-Well hydrated. Voice-Normal.  Head and Neck Head-normocephalic, atraumatic with no lesions or palpable masses.  Eye Sclera/Conjunctiva - Bilateral-No scleral icterus.  Chest and Lung Exam Chest and lung exam reveals -quiet, even and easy respiratory effort with no use of accessory muscles. Inspection Chest Wall - Normal. Back - normal.  Breast Note: less bruising bilaterally. Still 2-3 palpable nodes on left, mobile.     Assessment & Plan [name] MD; 04/03/2017 7:07 PM) CARCINOMA OF LEFT BREAST METASTATIC TO AXILLARY LYMPH NODE (C50.912) Impression: It is very reasonable to do bilateral mastectomies for this patient, She has an occult left breast cancer that is not visible on mammogram. She already has LN positive disease despite having no cancer show up on mammogram. She also has a lobular cancer and dense breasts bilaterally.  She would require screening MRI annually to follow the right breast even if we did a left mastectomy. Given her young age as well, she has a >20% risk of development of recurrent breast cancer no matter what genetics shows.  We will do left axillary lymph node dissection since she has several LN that appear positive and one that is  definitely positive. We will hold off on port at this point and see final pathology. If >3 nodes pathologically positive, she will most likely get chemo.  I discussed this patient with Dr. Marla Roe as well. We will plan nipple sparing on this patient as an approach with a left axillary  incision as well.  Reviewed risks and benefits as well. Will do at first available opportunity. If it will be greater than 2-4 weeks, will start tamoxifen. Current Plans Pt Education - [facility] Mastectomy [facility] You are being scheduled for surgery- Our schedulers will call you.  You should hear from our office's scheduling department within 5 working days about the location, date, and time of surgery. We try to make accommodations for patient's preferences in scheduling surgery, but sometimes the OR schedule or the surgeon's schedule prevents [place] from making those accommodations.  If you have not heard from our office [phone]) in 5 working days, call the office and ask for your surgeon's nurse.  If you have other questions about your diagnosis, plan, or surgery, call the office and ask for your surgeon's nurse.    Signed by [name], MD (04/03/2017 7:07 PM)

## 2017-04-26 ENCOUNTER — Other Ambulatory Visit: Payer: BLUE CROSS/BLUE SHIELD

## 2017-04-26 ENCOUNTER — Encounter: Payer: Self-pay | Admitting: *Deleted

## 2017-04-26 ENCOUNTER — Ambulatory Visit: Payer: BLUE CROSS/BLUE SHIELD | Admitting: Hematology

## 2017-04-26 ENCOUNTER — Ambulatory Visit: Payer: BLUE CROSS/BLUE SHIELD

## 2017-04-27 ENCOUNTER — Ambulatory Visit (HOSPITAL_BASED_OUTPATIENT_CLINIC_OR_DEPARTMENT_OTHER): Payer: BLUE CROSS/BLUE SHIELD | Admitting: Anesthesiology

## 2017-04-27 ENCOUNTER — Encounter (HOSPITAL_BASED_OUTPATIENT_CLINIC_OR_DEPARTMENT_OTHER)
Admission: RE | Disposition: A | Discharge status: Home / Self Care | Payer: Self-pay | Source: Ambulatory Visit | Attending: Plastic Surgery

## 2017-04-27 ENCOUNTER — Encounter (HOSPITAL_BASED_OUTPATIENT_CLINIC_OR_DEPARTMENT_OTHER): Payer: Self-pay | Admitting: Anesthesiology

## 2017-04-27 ENCOUNTER — Ambulatory Visit (HOSPITAL_COMMUNITY): Payer: BLUE CROSS/BLUE SHIELD

## 2017-04-27 ENCOUNTER — Ambulatory Visit (HOSPITAL_BASED_OUTPATIENT_CLINIC_OR_DEPARTMENT_OTHER)
Admission: RE | Admit: 2017-04-27 | Discharge: 2017-04-28 | Disposition: A | Discharge status: Home / Self Care | Payer: BLUE CROSS/BLUE SHIELD | Source: Ambulatory Visit | Attending: Plastic Surgery | Admitting: Plastic Surgery

## 2017-04-27 DIAGNOSIS — R918 Other nonspecific abnormal finding of lung field: Secondary | ICD-10-CM | POA: Diagnosis not present

## 2017-04-27 DIAGNOSIS — Z87891 Personal history of nicotine dependence: Secondary | ICD-10-CM | POA: Insufficient documentation

## 2017-04-27 DIAGNOSIS — N6021 Fibroadenosis of right breast: Secondary | ICD-10-CM | POA: Diagnosis not present

## 2017-04-27 DIAGNOSIS — C50912 Malignant neoplasm of unspecified site of left female breast: Secondary | ICD-10-CM | POA: Insufficient documentation

## 2017-04-27 DIAGNOSIS — N6081 Other benign mammary dysplasias of right breast: Secondary | ICD-10-CM | POA: Diagnosis not present

## 2017-04-27 DIAGNOSIS — Z8041 Family history of malignant neoplasm of ovary: Secondary | ICD-10-CM | POA: Insufficient documentation

## 2017-04-27 DIAGNOSIS — C50412 Malignant neoplasm of upper-outer quadrant of left female breast: Secondary | ICD-10-CM | POA: Diagnosis not present

## 2017-04-27 DIAGNOSIS — Z79899 Other long term (current) drug therapy: Secondary | ICD-10-CM | POA: Diagnosis not present

## 2017-04-27 DIAGNOSIS — Z803 Family history of malignant neoplasm of breast: Secondary | ICD-10-CM | POA: Insufficient documentation

## 2017-04-27 DIAGNOSIS — Z170 Estrogen receptor positive status [ER+]: Secondary | ICD-10-CM | POA: Diagnosis not present

## 2017-04-27 DIAGNOSIS — C50812 Malignant neoplasm of overlapping sites of left female breast: Secondary | ICD-10-CM | POA: Diagnosis not present

## 2017-04-27 DIAGNOSIS — N6011 Diffuse cystic mastopathy of right breast: Secondary | ICD-10-CM | POA: Diagnosis not present

## 2017-04-27 DIAGNOSIS — K209 Esophagitis, unspecified: Secondary | ICD-10-CM | POA: Diagnosis not present

## 2017-04-27 DIAGNOSIS — Z1501 Genetic susceptibility to malignant neoplasm of breast: Secondary | ICD-10-CM | POA: Diagnosis not present

## 2017-04-27 DIAGNOSIS — G8918 Other acute postprocedural pain: Secondary | ICD-10-CM | POA: Diagnosis not present

## 2017-04-27 DIAGNOSIS — C773 Secondary and unspecified malignant neoplasm of axilla and upper limb lymph nodes: Secondary | ICD-10-CM | POA: Diagnosis not present

## 2017-04-27 DIAGNOSIS — Z419 Encounter for procedure for purposes other than remedying health state, unspecified: Secondary | ICD-10-CM

## 2017-04-27 DIAGNOSIS — C50919 Malignant neoplasm of unspecified site of unspecified female breast: Secondary | ICD-10-CM

## 2017-04-27 HISTORY — PX: AXILLARY LYMPH NODE DISSECTION: SHX5229

## 2017-04-27 HISTORY — PX: BILATERAL TOTAL MASTECTOMY WITH AXILLARY LYMPH NODE DISSECTION: SHX6364

## 2017-04-27 HISTORY — PX: BREAST RECONSTRUCTION WITH PLACEMENT OF TISSUE EXPANDER AND FLEX HD (ACELLULAR HYDRATED DERMIS): SHX6295

## 2017-04-27 IMAGING — CR DG CHEST 1V PORT
1 series · 1 of 1 positions shown · non-contrast
Comparison: Chest x-ray dated [DATE].

CLINICAL DATA: Incorrect needle count. Bilateral skin sparing
mastectomies.

EXAM:
PORTABLE CHEST 1 VIEW

[ap]
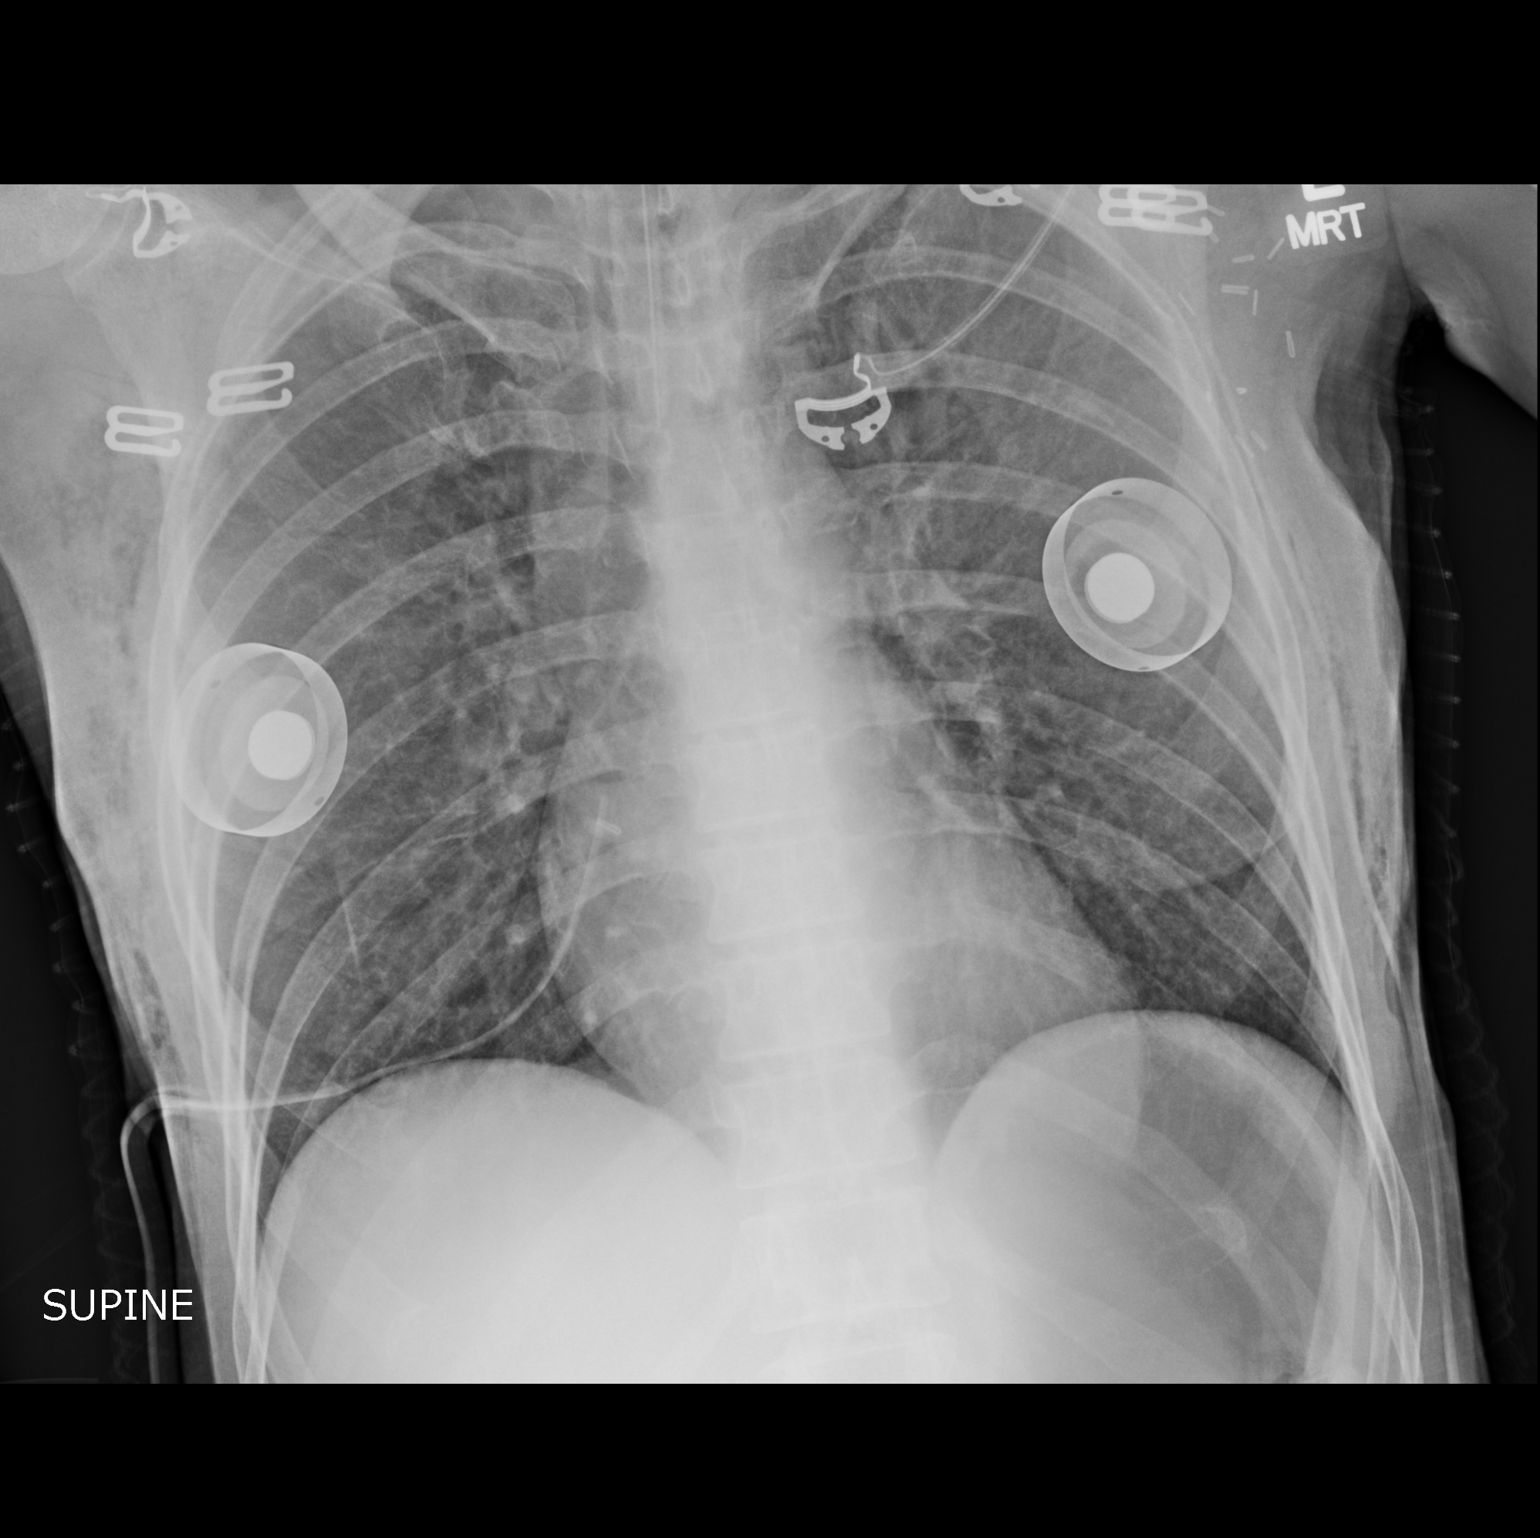

[1 of 1 positions shown; findings below may reference images not displayed]

FINDINGS: Status post bilateral mastectomies with expected postsurgical
changes including subcutaneous and chest wall emphysema, and right
chest wall and left axillary drains. Surgical clips in the left
axilla. No radiopaque foreign body seen.

Endotracheal tube in good position with the tip approximately 2.3 cm
above the level of the carina. Normal cardiomediastinal silhouette.
The lungs are clear. No acute osseous abnormality.
IMPRESSION: 1. No radiopaque foreign body seen.
2. Postsurgical changes related to bilateral mastectomies and left
axillary lymph node dissection.
These results were called by telephone at the time of interpretation
on [DATE] at [DATE].

## 2017-04-27 SURGERY — BILATERAL TOTAL MASTECTOMY WITH AXILLARY LYMPH NODE DISSECTION
Anesthesia: General | Site: Breast | Laterality: Left

## 2017-04-27 MED ORDER — DEXAMETHASONE SODIUM PHOSPHATE 4 MG/ML IJ SOLN
INTRAMUSCULAR | Status: DC | PRN
  Administered 2017-04-27: 10 mg via INTRAVENOUS

## 2017-04-27 MED ORDER — 0.9 % SODIUM CHLORIDE (POUR BTL) OPTIME
TOPICAL | Status: DC | PRN
  Administered 2017-04-27: 200 mL

## 2017-04-27 MED ORDER — ONDANSETRON HCL 4 MG/2ML IJ SOLN
INTRAMUSCULAR | Status: DC | PRN
  Administered 2017-04-27: 4 mg via INTRAVENOUS

## 2017-04-27 MED ORDER — LACTATED RINGERS IV SOLN
INTRAVENOUS | Status: DC
  Administered 2017-04-27 (×3): via INTRAVENOUS

## 2017-04-27 MED ORDER — CIPROFLOXACIN IN D5W 400 MG/200ML IV SOLN
400.0000 mg | Freq: Two times a day (BID) | INTRAVENOUS | Status: DC
  Administered 2017-04-27: 400 mg via INTRAVENOUS
  Filled 2017-04-27: qty 200

## 2017-04-27 MED ORDER — CIPROFLOXACIN IN D5W 400 MG/200ML IV SOLN
400.0000 mg | INTRAVENOUS | Status: AC
  Administered 2017-04-27: 400 mg via INTRAVENOUS

## 2017-04-27 MED ORDER — KETOROLAC TROMETHAMINE 30 MG/ML IJ SOLN
INTRAMUSCULAR | Status: AC
  Filled 2017-04-27: qty 1

## 2017-04-27 MED ORDER — CHLORHEXIDINE GLUCONATE CLOTH 2 % EX PADS: 6.0000 | MEDICATED_PAD | Freq: Once | CUTANEOUS | Status: DC

## 2017-04-27 MED ORDER — LIDOCAINE-EPINEPHRINE (PF) 1 %-1:200000 IJ SOLN
INTRAMUSCULAR | Status: AC
  Filled 2017-04-27: qty 30

## 2017-04-27 MED ORDER — DIAZEPAM 2 MG PO TABS
2.0000 mg | ORAL_TABLET | Freq: Two times a day (BID) | ORAL | Status: DC | PRN
  Administered 2017-04-27: 2 mg via ORAL
  Filled 2017-04-27: qty 1

## 2017-04-27 MED ORDER — FENTANYL CITRATE (PF) 100 MCG/2ML IJ SOLN: 25.0000 ug | INTRAMUSCULAR | Status: DC | PRN

## 2017-04-27 MED ORDER — GABAPENTIN 300 MG PO CAPS
300.0000 mg | ORAL_CAPSULE | ORAL | Status: AC
  Administered 2017-04-27: 300 mg via ORAL

## 2017-04-27 MED ORDER — EPHEDRINE 5 MG/ML INJ
INTRAVENOUS | Status: AC
  Filled 2017-04-27: qty 10

## 2017-04-27 MED ORDER — ROCURONIUM BROMIDE 10 MG/ML (PF) SYRINGE
PREFILLED_SYRINGE | INTRAVENOUS | Status: AC
  Filled 2017-04-27: qty 5

## 2017-04-27 MED ORDER — FENTANYL CITRATE (PF) 100 MCG/2ML IJ SOLN
INTRAMUSCULAR | Status: AC
  Filled 2017-04-27: qty 2

## 2017-04-27 MED ORDER — PROPOFOL 500 MG/50ML IV EMUL
INTRAVENOUS | Status: AC
  Filled 2017-04-27: qty 50

## 2017-04-27 MED ORDER — HYDROCODONE-ACETAMINOPHEN 5-325 MG PO TABS
1.0000 | ORAL_TABLET | ORAL | Status: DC | PRN
  Administered 2017-04-27 – 2017-04-28 (×3): 2 via ORAL
  Filled 2017-04-27 (×3): qty 2

## 2017-04-27 MED ORDER — ONDANSETRON 4 MG PO TBDP: 4.0000 mg | ORAL_TABLET | Freq: Four times a day (QID) | ORAL | Status: DC | PRN

## 2017-04-27 MED ORDER — NAPROXEN 500 MG PO TABS: 500.0000 mg | ORAL_TABLET | Freq: Two times a day (BID) | ORAL | Status: DC | PRN

## 2017-04-27 MED ORDER — SUCCINYLCHOLINE CHLORIDE 200 MG/10ML IV SOSY
PREFILLED_SYRINGE | INTRAVENOUS | Status: AC
Start: 2017-04-27 — End: 2017-04-27
  Filled 2017-04-27: qty 10

## 2017-04-27 MED ORDER — FENTANYL CITRATE (PF) 100 MCG/2ML IJ SOLN
50.0000 ug | INTRAMUSCULAR | Status: DC | PRN
  Administered 2017-04-27: 25 ug via INTRAVENOUS
  Administered 2017-04-27: 75 ug via INTRAVENOUS

## 2017-04-27 MED ORDER — MIDAZOLAM HCL 2 MG/2ML IJ SOLN
1.0000 mg | INTRAMUSCULAR | Status: DC | PRN
  Administered 2017-04-27 (×2): 2 mg via INTRAVENOUS

## 2017-04-27 MED ORDER — MIDAZOLAM HCL 2 MG/2ML IJ SOLN
INTRAMUSCULAR | Status: AC
  Filled 2017-04-27: qty 2

## 2017-04-27 MED ORDER — SODIUM CHLORIDE 0.9 % IJ SOLN
INTRAMUSCULAR | Status: AC
  Filled 2017-04-27: qty 10

## 2017-04-27 MED ORDER — MORPHINE SULFATE (PF) 4 MG/ML IV SOLN
INTRAVENOUS | Status: AC
  Filled 2017-04-27: qty 1

## 2017-04-27 MED ORDER — POLYETHYLENE GLYCOL 3350 17 G PO PACK: 17.0000 g | PACK | Freq: Every day | ORAL | Status: DC | PRN

## 2017-04-27 MED ORDER — ONDANSETRON HCL 4 MG/2ML IJ SOLN: 4.0000 mg | Freq: Four times a day (QID) | INTRAMUSCULAR | Status: DC | PRN

## 2017-04-27 MED ORDER — LIDOCAINE-EPINEPHRINE 1 %-1:100000 IJ SOLN
INTRAMUSCULAR | Status: AC
  Filled 2017-04-27: qty 1

## 2017-04-27 MED ORDER — DIPHENHYDRAMINE HCL 12.5 MG/5ML PO ELIX: 12.5000 mg | ORAL_SOLUTION | Freq: Four times a day (QID) | ORAL | Status: DC | PRN

## 2017-04-27 MED ORDER — KETOROLAC TROMETHAMINE 30 MG/ML IJ SOLN
30.0000 mg | Freq: Once | INTRAMUSCULAR | Status: AC
  Administered 2017-04-27: 30 mg via INTRAVENOUS

## 2017-04-27 MED ORDER — MORPHINE SULFATE (PF) 2 MG/ML IV SOLN
2.0000 mg | INTRAVENOUS | Status: DC | PRN
  Administered 2017-04-27 – 2017-04-28 (×3): 2 mg via INTRAVENOUS

## 2017-04-27 MED ORDER — SCOPOLAMINE 1 MG/3DAYS TD PT72: 1.0000 | MEDICATED_PATCH | Freq: Once | TRANSDERMAL | Status: DC | PRN

## 2017-04-27 MED ORDER — KCL IN DEXTROSE-NACL 20-5-0.45 MEQ/L-%-% IV SOLN
INTRAVENOUS | Status: DC
  Administered 2017-04-27: 15:00:00 via INTRAVENOUS
  Filled 2017-04-27: qty 1000

## 2017-04-27 MED ORDER — GABAPENTIN 300 MG PO CAPS
ORAL_CAPSULE | ORAL | Status: AC
  Filled 2017-04-27: qty 1

## 2017-04-27 MED ORDER — CELECOXIB 400 MG PO CAPS
400.0000 mg | ORAL_CAPSULE | ORAL | Status: AC
  Administered 2017-04-27: 400 mg via ORAL

## 2017-04-27 MED ORDER — ACETAMINOPHEN 500 MG PO TABS
1000.0000 mg | ORAL_TABLET | ORAL | Status: AC
  Administered 2017-04-27: 1000 mg via ORAL

## 2017-04-27 MED ORDER — DEXAMETHASONE SODIUM PHOSPHATE 10 MG/ML IJ SOLN
INTRAMUSCULAR | Status: AC
  Filled 2017-04-27: qty 1

## 2017-04-27 MED ORDER — CELECOXIB 200 MG PO CAPS
ORAL_CAPSULE | ORAL | Status: AC
  Filled 2017-04-27: qty 2

## 2017-04-27 MED ORDER — METHYLENE BLUE 0.5 % INJ SOLN
INTRAVENOUS | Status: AC
  Filled 2017-04-27: qty 10

## 2017-04-27 MED ORDER — SODIUM CHLORIDE 0.9 % IV SOLN
INTRAVENOUS | Status: DC | PRN
  Administered 2017-04-27: 500 mL

## 2017-04-27 MED ORDER — ACETAMINOPHEN 500 MG PO TABS
ORAL_TABLET | ORAL | Status: AC
  Filled 2017-04-27: qty 2

## 2017-04-27 MED ORDER — LIDOCAINE HCL (CARDIAC) 20 MG/ML IV SOLN
INTRAVENOUS | Status: DC | PRN
  Administered 2017-04-27: 50 mg via INTRAVENOUS

## 2017-04-27 MED ORDER — PHENYLEPHRINE 40 MCG/ML (10ML) SYRINGE FOR IV PUSH (FOR BLOOD PRESSURE SUPPORT)
PREFILLED_SYRINGE | INTRAVENOUS | Status: AC
  Filled 2017-04-27: qty 10

## 2017-04-27 MED ORDER — BUPIVACAINE HCL (PF) 0.5 % IJ SOLN
INTRAMUSCULAR | Status: AC
  Filled 2017-04-27: qty 30

## 2017-04-27 MED ORDER — SUFENTANIL CITRATE 50 MCG/ML IV SOLN
INTRAVENOUS | Status: DC | PRN
  Administered 2017-04-27: 5 ug via INTRAVENOUS
  Administered 2017-04-27 (×2): 10 ug via INTRAVENOUS

## 2017-04-27 MED ORDER — EPHEDRINE SULFATE 50 MG/ML IJ SOLN
INTRAMUSCULAR | Status: DC | PRN
  Administered 2017-04-27: 15 mg via INTRAVENOUS

## 2017-04-27 MED ORDER — ONDANSETRON HCL 4 MG/2ML IJ SOLN
INTRAMUSCULAR | Status: AC
  Filled 2017-04-27: qty 2

## 2017-04-27 MED ORDER — FENTANYL CITRATE (PF) 100 MCG/2ML IJ SOLN
25.0000 ug | INTRAMUSCULAR | Status: DC | PRN
  Administered 2017-04-27 (×4): 25 ug via INTRAVENOUS

## 2017-04-27 MED ORDER — DIPHENHYDRAMINE HCL 50 MG/ML IJ SOLN
12.5000 mg | Freq: Four times a day (QID) | INTRAMUSCULAR | Status: DC | PRN
  Administered 2017-04-27: 12.5 mg via INTRAVENOUS
  Filled 2017-04-27: qty 1

## 2017-04-27 MED ORDER — LIDOCAINE 2% (20 MG/ML) 5 ML SYRINGE
INTRAMUSCULAR | Status: AC
  Filled 2017-04-27: qty 5

## 2017-04-27 MED ORDER — PROPOFOL 10 MG/ML IV BOLUS
INTRAVENOUS | Status: DC | PRN
  Administered 2017-04-27: 150 mg via INTRAVENOUS

## 2017-04-27 MED ORDER — BISACODYL 10 MG RE SUPP
10.0000 mg | Freq: Every day | RECTAL | Status: DC | PRN
Start: 2017-04-27 — End: 2017-04-28

## 2017-04-27 MED ORDER — SUFENTANIL CITRATE 50 MCG/ML IV SOLN
INTRAVENOUS | Status: AC
  Filled 2017-04-27: qty 1

## 2017-04-27 MED ORDER — SENNA 8.6 MG PO TABS: 1.0000 | ORAL_TABLET | Freq: Two times a day (BID) | ORAL | Status: DC

## 2017-04-27 MED ORDER — CIPROFLOXACIN IN D5W 400 MG/200ML IV SOLN
INTRAVENOUS | Status: AC
  Filled 2017-04-27: qty 200

## 2017-04-27 MED ORDER — ACETAMINOPHEN 500 MG PO TABS: 1000.0000 mg | ORAL_TABLET | Freq: Four times a day (QID) | ORAL | Status: DC

## 2017-04-27 MED ORDER — ROCURONIUM BROMIDE 100 MG/10ML IV SOLN
INTRAVENOUS | Status: DC | PRN
  Administered 2017-04-27: 40 mg via INTRAVENOUS

## 2017-04-27 MED ORDER — SUGAMMADEX SODIUM 200 MG/2ML IV SOLN
INTRAVENOUS | Status: DC | PRN
  Administered 2017-04-27: 100 mg via INTRAVENOUS

## 2017-04-27 SURGICAL SUPPLY — 114 items
APPLIER CLIP 11 MED OPEN (CLIP)
APPLIER CLIP 9.375 MED OPEN (MISCELLANEOUS)
BAG DECANTER FOR FLEXI CONT (MISCELLANEOUS) ×10 IMPLANT
BANDAGE ACE 6X5 VEL STRL LF (GAUZE/BANDAGES/DRESSINGS) IMPLANT
BENZOIN TINCTURE PRP APPL 2/3 (GAUZE/BANDAGES/DRESSINGS) IMPLANT
BINDER BREAST LRG (GAUZE/BANDAGES/DRESSINGS) IMPLANT
BINDER BREAST MEDIUM (GAUZE/BANDAGES/DRESSINGS) ×5 IMPLANT
BINDER BREAST XLRG (GAUZE/BANDAGES/DRESSINGS) IMPLANT
BINDER BREAST XXLRG (GAUZE/BANDAGES/DRESSINGS) IMPLANT
BIOPATCH RED 1 DISK 7.0 (GAUZE/BANDAGES/DRESSINGS) ×10 IMPLANT
BLADE CLIPPER SURG (BLADE) IMPLANT
BLADE HEX COATED 2.75 (ELECTRODE) ×10 IMPLANT
BLADE SURG 10 STRL SS (BLADE) ×10 IMPLANT
BLADE SURG 15 STRL LF DISP TIS (BLADE) ×4 IMPLANT
BLADE SURG 15 STRL SS (BLADE) ×1
BNDG COHESIVE 4X5 TAN STRL (GAUZE/BANDAGES/DRESSINGS) ×5 IMPLANT
BNDG GAUZE ELAST 4 BULKY (GAUZE/BANDAGES/DRESSINGS) ×10 IMPLANT
BREAST TISSUE EXPANDER HIGH PROFILE, SMOOTH ×10 IMPLANT
CANISTER SUCT 1200ML W/VALVE (MISCELLANEOUS) ×10 IMPLANT
CHLORAPREP W/TINT 26ML (MISCELLANEOUS) ×15 IMPLANT
CLIP APPLIE 11 MED OPEN (CLIP) IMPLANT
CLIP APPLIE 9.375 MED OPEN (MISCELLANEOUS) IMPLANT
CLIP TI LARGE 6 (CLIP) ×5 IMPLANT
CLIP VESOCCLUDE MED 6/CT (CLIP) ×15 IMPLANT
CLIP VESOCCLUDE SM WIDE 6/CT (CLIP) IMPLANT
CONT SPEC 4OZ CLIKSEAL STRL BL (MISCELLANEOUS) ×5 IMPLANT
COVER BACK TABLE 60X90IN (DRAPES) ×5 IMPLANT
COVER MAYO STAND STRL (DRAPES) ×15 IMPLANT
COVER PROBE W GEL 5X96 (DRAPES) ×5 IMPLANT
DECANTER SPIKE VIAL GLASS SM (MISCELLANEOUS) IMPLANT
DERMABOND ADVANCED (GAUZE/BANDAGES/DRESSINGS) ×2
DERMABOND ADVANCED .7 DNX12 (GAUZE/BANDAGES/DRESSINGS) ×8 IMPLANT
DRAIN CHANNEL 15F RND FF W/TCR (WOUND CARE) IMPLANT
DRAIN CHANNEL 19F RND (DRAIN) ×10 IMPLANT
DRAPE LAPAROSCOPIC ABDOMINAL (DRAPES) IMPLANT
DRAPE U-SHAPE 76X120 STRL (DRAPES) IMPLANT
DRAPE UTILITY XL STRL (DRAPES) ×10 IMPLANT
DRSG PAD ABDOMINAL 8X10 ST (GAUZE/BANDAGES/DRESSINGS) ×10 IMPLANT
DRSG TEGADERM 4X10 (GAUZE/BANDAGES/DRESSINGS) IMPLANT
DRSG TEGADERM 4X4.75 (GAUZE/BANDAGES/DRESSINGS) IMPLANT
ELECT BLADE 4.0 EZ CLEAN MEGAD (MISCELLANEOUS) ×10
ELECT BLADE 6.5 .24CM SHAFT (ELECTRODE) IMPLANT
ELECT REM PT RETURN 9FT ADLT (ELECTROSURGICAL) ×10
ELECTRODE BLDE 4.0 EZ CLN MEGD (MISCELLANEOUS) ×8 IMPLANT
ELECTRODE REM PT RTRN 9FT ADLT (ELECTROSURGICAL) ×8 IMPLANT
EVACUATOR SILICONE 100CC (DRAIN) ×10 IMPLANT
GAUZE SPONGE 4X4 12PLY STRL (GAUZE/BANDAGES/DRESSINGS) ×5 IMPLANT
GLOVE BIO SURGEON STRL SZ 6 (GLOVE) ×5 IMPLANT
GLOVE BIO SURGEON STRL SZ 6.5 (GLOVE) ×10 IMPLANT
GLOVE BIO SURGEON STRL SZ7 (GLOVE) ×10 IMPLANT
GLOVE BIOGEL PI IND STRL 6.5 (GLOVE) ×4 IMPLANT
GLOVE BIOGEL PI INDICATOR 6.5 (GLOVE) ×1
GLOVE SURG SS PI 7.0 STRL IVOR (GLOVE) ×10 IMPLANT
GOWN SPEC L3 XXLG W/TWL (GOWN DISPOSABLE) ×5 IMPLANT
GOWN STRL REUS W/ TWL LRG LVL3 (GOWN DISPOSABLE) ×24 IMPLANT
GOWN STRL REUS W/TWL 2XL LVL3 (GOWN DISPOSABLE) ×5 IMPLANT
GOWN STRL REUS W/TWL LRG LVL3 (GOWN DISPOSABLE) ×6
GRAFT FLEX HD 4X16 THICK (Tissue Mesh) ×10 IMPLANT
ILLUMINATOR WAVEGUIDE N/F (MISCELLANEOUS) IMPLANT
IV NS 1000ML (IV SOLUTION)
IV NS 1000ML BAXH (IV SOLUTION) IMPLANT
IV NS 500ML (IV SOLUTION) ×2
IV NS 500ML BAXH (IV SOLUTION) ×8 IMPLANT
KIT FILL SYSTEM UNIVERSAL (SET/KITS/TRAYS/PACK) IMPLANT
KIT MARKER MARGIN INK (KITS) IMPLANT
LIGHT WAVEGUIDE WIDE FLAT (MISCELLANEOUS) IMPLANT
NDL SAFETY ECLIPSE 18X1.5 (NEEDLE) IMPLANT
NEEDLE HYPO 18GX1.5 SHARP (NEEDLE)
NEEDLE HYPO 25X1 1.5 SAFETY (NEEDLE) ×5 IMPLANT
NS IRRIG 1000ML POUR BTL (IV SOLUTION) ×5 IMPLANT
PACK BASIN DAY SURGERY FS (CUSTOM PROCEDURE TRAY) ×5 IMPLANT
PACK UNIVERSAL I (CUSTOM PROCEDURE TRAY) ×5 IMPLANT
PENCIL BUTTON HOLSTER BLD 10FT (ELECTRODE) ×10 IMPLANT
PIN SAFETY STERILE (MISCELLANEOUS) ×10 IMPLANT
SET ASEPTIC TRANSFER (MISCELLANEOUS) ×10 IMPLANT
SHEET MEDIUM DRAPE 40X70 STRL (DRAPES) IMPLANT
SLEEVE SCD COMPRESS KNEE MED (MISCELLANEOUS) ×10 IMPLANT
SPONGE LAP 18X18 X RAY DECT (DISPOSABLE) ×25 IMPLANT
STAPLER VISISTAT 35W (STAPLE) ×5 IMPLANT
STOCKINETTE 6  STRL (DRAPES)
STOCKINETTE 6 STRL (DRAPES) IMPLANT
STOCKINETTE IMPERVIOUS LG (DRAPES) ×5 IMPLANT
STRIP CLOSURE SKIN 1/2X4 (GAUZE/BANDAGES/DRESSINGS) IMPLANT
SUT ETHIBOND 2-0 V-5 NEEDLE (SUTURE) IMPLANT
SUT ETHILON 2 0 FS 18 (SUTURE) IMPLANT
SUT ETHILON 3 0 PS 1 (SUTURE) IMPLANT
SUT MNCRL AB 3-0 PS2 18 (SUTURE) IMPLANT
SUT MNCRL AB 4-0 PS2 18 (SUTURE) ×10 IMPLANT
SUT MON AB 3-0 SH 27 (SUTURE) ×2
SUT MON AB 3-0 SH27 (SUTURE) ×8 IMPLANT
SUT MON AB 4-0 PC3 18 (SUTURE) IMPLANT
SUT MON AB 5-0 PS2 18 (SUTURE) ×10 IMPLANT
SUT PDS 3-0 CT2 (SUTURE)
SUT PDS AB 2-0 CT2 27 (SUTURE) ×25 IMPLANT
SUT PDS II 3-0 CT2 27 ABS (SUTURE) IMPLANT
SUT SILK 2 0 SH (SUTURE) ×10 IMPLANT
SUT SILK 3 0 PS 1 (SUTURE) ×10 IMPLANT
SUT VIC AB 3-0 54X BRD REEL (SUTURE) IMPLANT
SUT VIC AB 3-0 BRD 54 (SUTURE)
SUT VIC AB 3-0 SH 27 (SUTURE)
SUT VIC AB 3-0 SH 27X BRD (SUTURE) IMPLANT
SUT VICRYL 3-0 CR8 SH (SUTURE) IMPLANT
SUT VICRYL 4-0 PS2 18IN ABS (SUTURE) IMPLANT
SYR 50ML LL SCALE MARK (SYRINGE) IMPLANT
SYR BULB 3OZ (MISCELLANEOUS) IMPLANT
SYR BULB IRRIGATION 50ML (SYRINGE) ×15 IMPLANT
SYR CONTROL 10ML LL (SYRINGE) ×5 IMPLANT
TOWEL OR 17X24 6PK STRL BLUE (TOWEL DISPOSABLE) ×20 IMPLANT
TOWEL OR NON WOVEN STRL DISP B (DISPOSABLE) ×5 IMPLANT
TRAY FOLEY BAG SILVER LF 14FR (SET/KITS/TRAYS/PACK) IMPLANT
TRAY FOLEY BAG SILVER LF 16FR (SET/KITS/TRAYS/PACK) IMPLANT
TUBE CONNECTING 20X1/4 (TUBING) ×10 IMPLANT
UNDERPAD 30X30 (UNDERPADS AND DIAPERS) ×15 IMPLANT
YANKAUER SUCT BULB TIP NO VENT (SUCTIONS) ×10 IMPLANT

## 2017-04-27 NOTE — Anesthesia Preprocedure Evaluation (Signed)
Anesthesia Evaluation  Patient identified by MRN, date of birth, ID band Patient awake    Reviewed: Allergy & Precautions, NPO status , Patient's Chart, lab work & pertinent test results  Airway Mallampati: II  TM Distance: >3 FB     Dental   Pulmonary former smoker,    breath sounds clear to auscultation       Cardiovascular negative cardio ROS   Rhythm:Regular Rate:Normal     Neuro/Psych    GI/Hepatic negative GI ROS, Neg liver ROS,   Endo/Other  negative endocrine ROS  Renal/GU negative Renal ROS     Musculoskeletal   Abdominal   Peds  Hematology   Anesthesia Other Findings   Reproductive/Obstetrics                             Anesthesia Physical Anesthesia Plan  ASA: II  Anesthesia Plan: General   Post-op Pain Management:    Induction: Intravenous  PONV Risk Score and Plan: 3 and Ondansetron, Dexamethasone, Midazolam, Propofol infusion and Treatment may vary due to age or medical condition  Airway Management Planned: LMA  Additional Equipment:   Intra-op Plan:   Post-operative Plan: Extubation in OR  Informed Consent: I have reviewed the patients History and Physical, chart, labs and discussed the procedure including the risks, benefits and alternatives for the proposed anesthesia with the patient or authorized representative who has indicated his/her understanding and acceptance.   Dental advisory given  Plan Discussed with: CRNA and Anesthesiologist  Anesthesia Plan Comments:         Anesthesia Quick Evaluation

## 2017-04-27 NOTE — Interval H&P Note (Signed)
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 After consideration of risks, benefits and other options for treatment, the patient has consented to  Procedure(s): SKIN SPARING MASTECTOMY (Bilateral) AXILLARY LYMPH NODE DISSECTION (Left) BILATERAL BREAST RECONSTRUCTION WITH PLACEMENT OF TISSUE EXPANDER AND FLEX HD (ACELLULAR HYDRATED DERMIS) (Bilateral) as a surgical intervention .  The patient's history has been reviewed, patient examined, no change in status, stable for surgery.  I have reviewed the patient's chart and labs.  Questions were answered to the patient's satisfaction.     Wallace Going

## 2017-04-27 NOTE — Anesthesia Procedure Notes (Signed)
Procedure Name: Intubation Date/Time: 04/27/2017 10:21 AM Performed by: Melynda Ripple D Pre-anesthesia Checklist: Patient identified, Emergency Drugs available, Suction available and Patient being monitored Patient Re-evaluated:Patient Re-evaluated prior to induction Oxygen Delivery Method: Circle system utilized Preoxygenation: Pre-oxygenation with 100% oxygen Induction Type: IV induction Ventilation: Mask ventilation without difficulty Laryngoscope Size: Mac and 3 Grade View: Grade I Tube type: Oral Tube size: 7.0 mm Number of attempts: 1 Airway Equipment and Method: Stylet and Oral airway Placement Confirmation: ETT inserted through vocal cords under direct vision,  positive ETCO2 and breath sounds checked- equal and bilateral Secured at: 23 cm Tube secured with: Tape Dental Injury: Teeth and Oropharynx as per pre-operative assessment

## 2017-04-27 NOTE — Op Note (Signed)
Op report    DATE OF OPERATION:  04/27/2017  LOCATION: [place]  SURGICAL DIVISION: Plastic Surgery  PREOPERATIVE DIAGNOSES:  1. Left Breast cancer.    POSTOPERATIVE DIAGNOSES:  1. Left Breast cancer.   PROCEDURE:  1. Bilateral immediate breast reconstruction with placement of Acellular Dermal Matrix and tissue expanders.  SURGEON: Prapti Grussing Sanger Jj Enyeart, DO  ASSISTANT: Shawn Rayburn, PA  ANESTHESIA:  General.   COMPLICATIONS: None.   IMPLANTS: Left - Mentor 225 cc. Ref #[id number].  Serial Number [id number], 30 cc of saline placed Right - Mentor 225 cc. Ref #[id number].  Serial Number [id number], 30 cc of saline placed Acellular Dermal Matrix - Flex HD 4 x 16 cm two  INDICATIONS FOR PROCEDURE:  The patient, Madison Savage, is a 46 y.o. female born on [birth year], is here for  immediate first stage breast reconstruction with placement of bilateral tissue expander and Acellular dermal matrix. MRN: [id number]  CONSENT:  Informed consent was obtained directly from the patient. Risks, benefits and alternatives were fully discussed. Specific risks including but not limited to bleeding, infection, hematoma, seroma, scarring, pain, implant infection, implant extrusion, capsular contracture, asymmetry, wound healing problems, and need for further surgery were all discussed. The patient did have an ample opportunity to have her questions answered to her satisfaction.   DESCRIPTION OF PROCEDURE:  The patient was taken to the operating room by the general surgery team. SCDs were placed and IV antibiotics were given. The patient's chest was prepped and draped in a sterile fashion. A time out was performed and the implants to be used were identified.  Bilateral mastectomies were performed.  Once the general surgery team had completed their portion of the case the patient was rendered to the plastic and reconstructive surgery team.  Right:   The pectoralis major  muscle was lifted from the chest wall with release of the lateral edge and lateral inframammary fold.  The pocket was irrigated with antibiotic solution and hemostasis was achieved with electrocautery.  The ADM was then prepared according to the manufacture guidelines and slits placed to help with postoperative fluid management.  The ADM was then sutured to the inferior and lateral edge of the inframammary fold with 2-0 PDS starting with an interrupted stitch and then a running stitch.  The lateral portion was sutured to with interrupted sutures after the expander was placed.  The expander was prepared according to the manufacture guidelines, the air evacuated and then it was placed under the ADM and pectoralis major muscle.  The inferior and lateral tabs were used to secure the expander to the chest wall with 2-0 PDS.  The drain was placed at the inframammary fold over the ADM and secured to the skin with 3-0 Silk.  The deep layers were closed with 3-0 Monocryl followed by 4-0 Monocryl.  The skin was closed with 5-0 Monocryl and then dermabond was applied.  Left:  The pectoralis major muscle was lifted from the chest wall with release of the lateral edge and lateral inframammary fold.  The pocket was irrigated with antibiotic solution and hemostasis was achieved with electrocautery.  The ADM was then prepared according to the manufacture guidelines and slits placed to help with postoperative fluid management.  The ADM was then sutured to the inferior and lateral edge of the inframammary fold with 2-0 PDS starting with an interrupted stitch and then a running stitch.  The lateral portion was sutured to with interrupted sutures after the expander was placed.  The expander was prepared according to the manufacture guidelines, the air evacuated and then it was placed under the ADM and pectoralis major muscle.  The inferior and lateral tabs were used to secure the expander to the chest wall with 2-0 PDS.  The drain  was placed at the inframammary fold over the ADM and secured to the skin with 3-0 Silk.  The axillary incision was closed with the 3-0 and 4-0 Monocryl deep.  The skin was closed with 5-0 Monocryl.    The deep layers were closed with 3-0 Monocryl followed by 4-0 Monocryl.  The skin was closed with 5-0 Monocryl.  Dermabond was applied to all incision sites.The ABDs and breast binder were placed.  The patient tolerated the procedure well and there were no complications.  The patient was allowed to wake from anesthesia and taken to the recovery room in satisfactory condition.

## 2017-04-27 NOTE — Interval H&P Note (Signed)
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 After consideration of risks, benefits and other options for treatment, the patient has consented to  Procedure(s): Skin SPARING MASTECTOMY (Bilateral) AXILLARY LYMPH NODE DISSECTION (Left) BILATERAL BREAST RECONSTRUCTION WITH PLACEMENT OF TISSUE EXPANDER AND FLEX HD (ACELLULAR HYDRATED DERMIS) (Bilateral) as a surgical intervention .  The patient's history has been reviewed, patient examined, no change in status, stable for surgery.  I have reviewed the patient's chart and labs.  Questions were answered to the patient's satisfaction.     [name]

## 2017-04-27 NOTE — Op Note (Signed)
Bilateral Skin Sparing Mastectomy with left axillary lymph node dissection.  Indications: This patient presents with history of left breast cancer with biopsy proven left axillary lymph node metastasis  Pre-operative Diagnosis: left breast cancer, cTxN1M0, ER/PR +, Her 2 negative, BRCA 2 mutation  Post-operative Diagnosis: left breast cancer, same  Surgeon: Almond Lint   Anesthesia: General endotracheal anesthesia and Local anesthesia 0.25.% bupivacaine, with epinephrine  ASA Class: 2  Procedure Details  The patient was seen in the Holding Room. The risks, benefits, complications, treatment options, and expected outcomes were discussed with the patient. The possibilities of reaction to medication, pulmonary aspiration, bleeding, infection, the need for additional procedures, failure to diagnose a condition, and creating a complication requiring transfusion or operation were discussed with the patient. The patient concurred with the proposed plan, giving informed consent.  The site of surgery properly noted/marked. The patient was taken to Operating Room # 7, identified as Madison Savage and the procedure verified as Bilateral Skin Sparing Mastectomy and left axillary lymph node dissection. A Time Out was held and the above information confirmed.    After induction of anesthesia, the upper abdomen, and bilateral breast, and chest were prepped and draped in standard fashion.  The borders of the breasts were identified and marked.  The right breast was addressed first.  A laterally oriented teardrop incision was made incorporating the nipple.  Mastectomy hooks were used to provide elevation of the skin edges, and the cautery was used to create the mastectomy flaps.  The dissection was taken to the fascia of the pectoralis major.  The penetrating vessels were clipped as needed.  The superior flap was taken medially to the lateral sternal border, superiorly to the inferior border of the clavicle.  The  inferior flap was similarly created, inferiorly to the inframammary fold and laterally to the border of the latissimus.  The breast was taken off including the pectoralis fascia and the axillary tail marked.  The cavity was irrigated and hemostasis achieved with cautery.    The left breast was addressed similarly.    The left axilla was then addressed next.  A small incision was made in the left axilla at the inferior border of the hairline.  An axillary dissection was performed with removal of the associated lymph nodes and surrounding adipose tissue. This included levels I and II. This was accomplished by exposing the axillary vein anteriorly and inferiorly to the level of the pectoralis minor and laterally over the latissimus dorsi muscle. Posteriorly, the dissection continued to the subscapularis.  Small venous tributaries, lymphatics, and vessels were clipped and ligated or cauterized and divided. The subscapularis muscle was skeletonized. The long thoracic and thoracodorsal neurovascular bundles were identified and preserved.  The wound was irrigated.  Hemostasis was achieved with cautery and blue clips.  A 19 Fr was placed in the axilla.  The axillary wound was irrigated and closed with a 3-0 Vicryl deep dermal interrupted and a 4-0 vicryl subcuticular closure in layers.    The breast wounds were closed by Dr. Ulice Bold after expander reconstruction was performed.  Sterile dressings were applied. At the end of the operation, all sponge, instrument, and needle counts were correct.  Findings: grossly clear surgical margins  Estimated Blood Loss:  100 mL         Drains: 19 Blake in the left axilla.  Remaining drains per Dr. Ulice Bold.                Specimens: R breast, left breast,  and left axillary contents         Complications:  None; patient tolerated the procedure well.         Disposition: PACU - hemodynamically stable.         Condition: stable

## 2017-04-27 NOTE — Progress Notes (Addendum)
Assisted Dr. Green with right, left, ultrasound guided, pectoralis block. Side rails up, monitors on throughout procedure. See vital signs in flow sheet. Tolerated Procedure well. 

## 2017-04-27 NOTE — Transfer of Care (Signed)
Immediate Anesthesia Transfer of Care Note  Patient: Madison Savage  Procedure(s) Performed: Procedure(s): SKIN SPARING MASTECTOMY (Bilateral) AXILLARY LYMPH NODE DISSECTION (Left) BILATERAL BREAST RECONSTRUCTION WITH PLACEMENT OF TISSUE EXPANDER AND FLEX HD (ACELLULAR HYDRATED DERMIS) (Bilateral)  Patient Location: PACU  Anesthesia Type:General  Level of Consciousness: awake, alert  and oriented  Airway & Oxygen Therapy: Patient Spontanous Breathing and Patient connected to face mask oxygen  Post-op Assessment: Report given to RN and Post -op Vital signs reviewed and stable  Post vital signs: Reviewed and stable  Last Vitals:  Vitals:   04/27/17 0945 04/27/17 1000  BP:  (!) 89/54  Pulse: 62 (!) 54  Resp: 10 (!) 9  Temp:    SpO2: 100% 100%    Last Pain:  Vitals:   04/27/17 0907  TempSrc: Oral  PainSc: 0-No pain         Complications: No apparent anesthesia complications

## 2017-04-27 NOTE — H&P (Signed)
Madison Savage is an 46 y.o. female.   Chief Complaint: breast cancer HPI: The patient is a 46 y.o. yrs old wf here for immediate bilateral breast reconstruction with tissue expander and dermal matrix.  She was on vacation when she felt a mass under her arm in the left axilla.  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  She is BRCA2 positive.  The axillary nodes are involved and palpable.  She is 5 feet 7 inches tall, weight is 117 pounds, preop bra= 32 A/B.  She would like to be a B/C.  She is very active with daily exercise and work outs.  She has 3 children.  She quit smoking last month.  She has bruising on her left breast from the biopsies.  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  The nodes are palpable.  Past Medical History:  Diagnosis Date  . Cancer ([name]) 03/2017   left breast cancer  . Chronic ITP (idiopathic thrombocytopenia) ([facility])   . Family history of breast cancer   . Family history of ovarian cancer   . MTHFR mutation [facility])     Past Surgical History:  Procedure Laterality Date  . WISDOM TOOTH EXTRACTION      Family History  Problem Relation Age of Onset  . Breast cancer Paternal Aunt 101  . Ovarian cancer Paternal Aunt 67  . Cancer Paternal Grandmother        breast cancer   . Cancer Maternal Aunt        lymphoma   . Cancer Paternal Grandfather        throat cancer   . Cancer Paternal Aunt        lung cancer  . Cirrhosis Father   . AAA (abdominal aortic aneurysm) Maternal Grandfather   . Leukemia Paternal Aunt    Social History:  reports that she quit smoking about 7 weeks ago. Her smoking use included Cigarettes. She has a 10.00 pack-year smoking history. She has never used smokeless tobacco. She reports that she does not drink alcohol or use drugs.  Allergies:  Allergies  Allergen Reactions  . Clindamycin Anaphylaxis    REACTION: rash, sob  .  Penicillins Anaphylaxis    REACTION: rash  . Codeine     REACTION: sleepless,itches    No prescriptions prior to admission.    Results for orders placed or performed during the hospital encounter of 04/25/17 (from the past 48 hour(s))  Pregnancy, urine POC     Status: None   Collection Time: 04/25/17 12:58 PM  Result Value Ref Range   Preg Test, Ur NEGATIVE NEGATIVE    Comment:        THE SENSITIVITY OF THIS METHODOLOGY IS >24 mIU/mL    No results found.  Review of Systems  Constitutional: Negative.   HENT: Negative.   Eyes: Negative.   Respiratory: Negative.   Cardiovascular: Negative.   Gastrointestinal: Negative.   Genitourinary: Negative.   Musculoskeletal: Negative.   Skin: Negative.   Neurological: Negative.   Psychiatric/Behavioral: Negative.     Height _0  (1.702 m), weight 53.1 kg (117 lb). Physical Exam  Constitutional: She is oriented to person, place, and time. She appears well-developed and well-nourished.  HENT:  Head: Normocephalic and atraumatic.  Eyes: Pupils are equal, round, and reactive to light. Conjunctivae and EOM are normal.  Cardiovascular: Normal rate.   Respiratory: No respiratory distress.  GI:  Soft. She exhibits no distension.  Musculoskeletal: She exhibits no edema.  Neurological: She is alert and oriented to person, place, and time.  Skin: Skin is warm. No rash noted. No erythema. No pallor.  Psychiatric: She has a normal mood and affect. Her behavior is normal. Judgment and thought content normal.     Assessment/Plan Plan for immediate reconstruction with expander and flexHd.  Wallace Going, DO 04/27/2017, 7:11 AM

## 2017-04-27 NOTE — Anesthesia Postprocedure Evaluation (Signed)
Anesthesia Post Note  Patient: Madison Savage  Procedure(s) Performed: Procedure(s) (LRB): SKIN SPARING MASTECTOMY (Bilateral) AXILLARY LYMPH NODE DISSECTION (Left) BILATERAL BREAST RECONSTRUCTION WITH PLACEMENT OF TISSUE EXPANDER AND FLEX HD (ACELLULAR HYDRATED DERMIS) (Bilateral)     Patient location during evaluation: PACU Anesthesia Type: General Level of consciousness: awake Pain management: pain level controlled Vital Signs Assessment: post-procedure vital signs reviewed and stable Respiratory status: spontaneous breathing Cardiovascular status: stable Postop Assessment: no signs of nausea or vomiting Anesthetic complications: no    Last Vitals:  Vitals:   04/27/17 1430 04/27/17 1542  BP: 122/68 114/66  Pulse: 74 75  Resp: 16 18  Temp:  36.6 C  SpO2: 100% 100%    Last Pain:  Vitals:   04/27/17 1700  TempSrc:   PainSc: Asleep                 Maliki Gignac

## 2017-04-27 NOTE — Anesthesia Procedure Notes (Signed)
Anesthesia Regional Block: Pectoralis block   Pre-Anesthetic Checklist: ,, timeout performed, Correct Patient, Correct Site, Correct Laterality, Correct Procedure, Correct Position, site marked, Risks and benefits discussed,  Surgical consent,  Pre-op evaluation,  At surgeon's request and post-op pain management  Laterality: Left and Right  Prep: chloraprep       Needles:   Needle Type: Stimulator Needle - 40          Additional Needles:   Procedures: Doppler guided, Ultrasound guided,,,,,,  Narrative:  Start time: 04/27/2017 9:30 AM End time: 04/27/2017 9:45 AM Injection made incrementally with aspirations every 5 mL. Anesthesiologist: Alaney Witter

## 2017-04-28 ENCOUNTER — Encounter (HOSPITAL_BASED_OUTPATIENT_CLINIC_OR_DEPARTMENT_OTHER): Payer: Self-pay | Admitting: General Surgery

## 2017-04-28 DIAGNOSIS — Z79899 Other long term (current) drug therapy: Secondary | ICD-10-CM | POA: Diagnosis not present

## 2017-04-28 DIAGNOSIS — Z8041 Family history of malignant neoplasm of ovary: Secondary | ICD-10-CM | POA: Diagnosis not present

## 2017-04-28 DIAGNOSIS — C50912 Malignant neoplasm of unspecified site of left female breast: Secondary | ICD-10-CM | POA: Diagnosis not present

## 2017-04-28 DIAGNOSIS — Z1501 Genetic susceptibility to malignant neoplasm of breast: Secondary | ICD-10-CM | POA: Diagnosis not present

## 2017-04-28 DIAGNOSIS — Z170 Estrogen receptor positive status [ER+]: Secondary | ICD-10-CM | POA: Diagnosis not present

## 2017-04-28 DIAGNOSIS — Z803 Family history of malignant neoplasm of breast: Secondary | ICD-10-CM | POA: Diagnosis not present

## 2017-04-28 DIAGNOSIS — C773 Secondary and unspecified malignant neoplasm of axilla and upper limb lymph nodes: Secondary | ICD-10-CM | POA: Diagnosis not present

## 2017-04-28 DIAGNOSIS — Z87891 Personal history of nicotine dependence: Secondary | ICD-10-CM | POA: Diagnosis not present

## 2017-04-28 NOTE — Discharge Instructions (Signed)

## 2017-05-02 ENCOUNTER — Telehealth: Payer: Self-pay | Admitting: General Surgery

## 2017-05-02 NOTE — Telephone Encounter (Signed)
Discussed pathology with patient.  Will plan chemo and port placement tentatively.  Pt interested in cold cap.

## 2017-05-03 ENCOUNTER — Telehealth: Payer: Self-pay | Admitting: *Deleted

## 2017-05-03 NOTE — Telephone Encounter (Signed)
Pt called to discuss next steps since she has 8+LN. Discussed recommendations of chemotherapy and port placement. Pt very interested in "cold cap" use during chemotherapy. Informed pt that we do not have this equipment, but would be happy to refer to [place] agreeable for referral to [facility]. Referral sent to [place].

## 2017-05-04 ENCOUNTER — Telehealth: Payer: Self-pay | Admitting: Genetic Counselor

## 2017-05-04 NOTE — Progress Notes (Signed)
Updated genetic testing:  At the time of Madison Savage's results visit, she agreed to reflex her testing to complete the common hereditary cancer panel.  The genetic testing reported on April 29, 2017 through the [facility] Common Hereditary cancer panel identified a single heterozygous pathogenic gene mutation called BRCA2 [id number].  This is the same variant identified in the STAT panel.    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  At this time, it is unknown if this variant is associated with increased cancer risk or if this is a normal finding, but most variants such as this get reclassified to being inconsequential. It should not be used to make medical management decisions. With time, we suspect the lab will determine the significance of this variant, if any. If we do learn more about it, we will try to contact Madison Savage to discuss it further. However, it is important to stay in touch with [place] periodically and keep the address and phone number up to date.      Medical Management will not change based on the NF1 VUS.  Madison Kayser, MS, [facility] Certified Genetic Counselor Madison [email or web address] Phone: [phone]

## 2017-05-04 NOTE — Telephone Encounter (Signed)
Discussed that the common hereditary cancer panel came back and the BRCA2 mutation was again identified, along with an NF1 VUS.  Discussed in detail the VUS, and that it is not clinically actionable.  Patient voiced her understanding.

## 2017-05-06 NOTE — Addendum Note (Signed)
Addendum  created 05/06/17 0156 by Belinda Block, MD   Image imported

## 2017-05-09 ENCOUNTER — Telehealth: Payer: Self-pay | Admitting: *Deleted

## 2017-05-09 NOTE — Telephone Encounter (Signed)
Spoke with Anderson Malta at [place] and schedule pt to see. Dr Marcello Moores on 05/24/17 at [place] at [facility] and confirmed pt with appt date and time. Pt request to cancel Dr. Burr Medico appt on 9/7 d/t she will have chemo at [facility] d/t "cold cap".  Discussed pt with that she will need to be released by Dr. Barry Dienes before starting PT. Received verbal understanding. Denies further needs or questions at this time.

## 2017-05-12 ENCOUNTER — Ambulatory Visit: Payer: BLUE CROSS/BLUE SHIELD | Admitting: Hematology

## 2017-05-12 DIAGNOSIS — C773 Secondary and unspecified malignant neoplasm of axilla and upper limb lymph nodes: Secondary | ICD-10-CM | POA: Diagnosis not present

## 2017-05-12 DIAGNOSIS — C50412 Malignant neoplasm of upper-outer quadrant of left female breast: Secondary | ICD-10-CM | POA: Diagnosis not present

## 2017-05-12 DIAGNOSIS — D0512 Intraductal carcinoma in situ of left breast: Secondary | ICD-10-CM | POA: Diagnosis not present

## 2017-05-12 DIAGNOSIS — N6012 Diffuse cystic mastopathy of left breast: Secondary | ICD-10-CM | POA: Diagnosis not present

## 2017-05-15 ENCOUNTER — Other Ambulatory Visit: Payer: Self-pay | Admitting: General Surgery

## 2017-05-17 DIAGNOSIS — C50912 Malignant neoplasm of unspecified site of left female breast: Secondary | ICD-10-CM | POA: Diagnosis not present

## 2017-05-18 ENCOUNTER — Telehealth: Payer: Self-pay | Admitting: *Deleted

## 2017-05-18 DIAGNOSIS — C50912 Malignant neoplasm of unspecified site of left female breast: Secondary | ICD-10-CM

## 2017-05-18 DIAGNOSIS — Z170 Estrogen receptor positive status [ER+]: Principal | ICD-10-CM

## 2017-05-18 NOTE — Telephone Encounter (Signed)
Spoke to pt, relate doing well. Excited to start PT. Referral placed for [facility]. Denies further questions or needs at this time

## 2017-05-19 ENCOUNTER — Other Ambulatory Visit: Payer: Self-pay | Admitting: General Surgery

## 2017-05-24 DIAGNOSIS — Z1501 Genetic susceptibility to malignant neoplasm of breast: Secondary | ICD-10-CM | POA: Diagnosis not present

## 2017-05-24 DIAGNOSIS — C773 Secondary and unspecified malignant neoplasm of axilla and upper limb lymph nodes: Secondary | ICD-10-CM | POA: Diagnosis not present

## 2017-05-24 DIAGNOSIS — C50912 Malignant neoplasm of unspecified site of left female breast: Secondary | ICD-10-CM | POA: Diagnosis not present

## 2017-05-24 DIAGNOSIS — D693 Immune thrombocytopenic purpura: Secondary | ICD-10-CM | POA: Diagnosis not present

## 2017-05-25 ENCOUNTER — Ambulatory Visit: Payer: BLUE CROSS/BLUE SHIELD | Admitting: Physical Therapy

## 2017-05-25 ENCOUNTER — Telehealth: Payer: Self-pay | Admitting: *Deleted

## 2017-05-25 ENCOUNTER — Ambulatory Visit: Payer: BLUE CROSS/BLUE SHIELD | Attending: General Surgery | Admitting: Physical Therapy

## 2017-05-25 DIAGNOSIS — M25511 Pain in right shoulder: Secondary | ICD-10-CM | POA: Diagnosis not present

## 2017-05-25 DIAGNOSIS — C50912 Malignant neoplasm of unspecified site of left female breast: Secondary | ICD-10-CM

## 2017-05-25 DIAGNOSIS — Z483 Aftercare following surgery for neoplasm: Secondary | ICD-10-CM | POA: Insufficient documentation

## 2017-05-25 DIAGNOSIS — R293 Abnormal posture: Secondary | ICD-10-CM | POA: Insufficient documentation

## 2017-05-25 DIAGNOSIS — M25611 Stiffness of right shoulder, not elsewhere classified: Secondary | ICD-10-CM

## 2017-05-25 DIAGNOSIS — Z170 Estrogen receptor positive status [ER+]: Principal | ICD-10-CM

## 2017-05-25 DIAGNOSIS — M25612 Stiffness of left shoulder, not elsewhere classified: Secondary | ICD-10-CM | POA: Diagnosis not present

## 2017-05-25 DIAGNOSIS — M25512 Pain in left shoulder: Secondary | ICD-10-CM

## 2017-05-25 NOTE — Therapy (Signed)
[place], [place], [address] Phone: [phone]   Fax:  [phone]  Physical Therapy Evaluation  Patient Details  Name: Madison Savage MRN: [id number] Date of Birth: 09/28/1970 Referring Provider: Dr. Burr Medico   Encounter Date: 05/25/2017      PT End of Session - 05/25/17 1725    Visit Number 2   Number of Visits 17   Date for PT Re-Evaluation 07/25/17   PT Start Time 2426   PT Stop Time 1650   PT Time Calculation (min) 45 min   Activity Tolerance Patient tolerated treatment well   Behavior During Therapy [facility] for tasks assessed/performed      Past Medical History:  Diagnosis Date  . Cancer ([facility]) 03/2017   left breast cancer  . Chronic ITP (idiopathic thrombocytopenia) ([facility]CC)   . Family history of breast cancer   . Family history of ovarian cancer   . MTHFR mutation [facility])     Past Surgical History:  Procedure Laterality Date  . AXILLARY LYMPH NODE DISSECTION Left 04/27/2017   Procedure: AXILLARY LYMPH NODE DISSECTION;  Surgeon: [name], MD;  Location: [facility];  Service: General;  Laterality: Left;  . BILATERAL TOTAL MASTECTOMY WITH AXILLARY LYMPH NODE DISSECTION Bilateral 04/27/2017   Procedure: SKIN SPARING MASTECTOMY;  Surgeon: [name], MD;  Location: [facility];  Service: General;  Laterality: Bilateral;  . BREAST RECONSTRUCTION WITH PLACEMENT OF TISSUE EXPANDER AND FLEX HD (ACELLULAR HYDRATED DERMIS) Bilateral 04/27/2017   Procedure: BILATERAL BREAST RECONSTRUCTION WITH PLACEMENT OF TISSUE EXPANDER AND FLEX HD (ACELLULAR HYDRATED DERMIS);  Surgeon: Wallace Going, DO;  Location: [facility];  Service: Plastics;  Laterality: Bilateral;  . WISDOM TOOTH EXTRACTION      There were no vitals filed for this visit.       Subjective Assessment - 05/25/17 1622    Subjective Pt is having trouble lifting her left arm and pain after  the fills.  She has dimpling in the front of her arm    Pertinent History Patient was diagnosed on 03/21/17 with left invasive lobular carcinoma breast cancer. It measures 2.1 cm and is located in the upper outer quadrant. She also has 6 abnormal axillary lymph nodes and a node was biopsied and found to be positive for carcinoma.   04/27/2017 .bilateral mastecomy with ALND ( 21 nodes removed) with immedicate expanders. She is planning to have chemo, then have implant surgery and hysterectomy and then have radiation    Patient Stated Goals to get motion in arm back, return to exercise, and learn what to do to prevent lymphedema    Currently in Pain? No/denies            [facility] PT Assessment - 05/25/17 0001      Assessment   Medical Diagnosis Left breast cancer   Referring Provider Dr. Burr Medico    Onset Date/Surgical Date 04/27/17   Hand Dominance Right   Prior Therapy none     Precautions   Precautions Other (comment)   Precaution Comments --     Restrictions   Weight Bearing Restrictions No     Balance Screen   Has the patient fallen in the past 6 months No   Has the patient had a decrease in activity level because of a fear of falling?  No   Is the patient reluctant to leave their home because of a fear of falling?  No     Home  [place] residence   Living Arrangements Spouse/significant other;Children  Husband, 24, 52, and 89 y.o. kids   Available Help at Discharge Family     Prior Function   Level of Independence Independent   Vocation Unemployed   Leisure pt has been walking some but is anxious to return to exercise      Cognition   Overall Cognitive Status Within Functional Limits for tasks assessed     Observation/Other Assessments   Observations Pt has bilateral expanders.  She has healing incision in left axilla    Skin Integrity no open areas   Quick DASH  52.27     Sensation   Additional Comments pt reports numbness and funny  feelings in left upper arm      Coordination   Gross Motor Movements are Fluid and Coordinated No   Coordination and Movement Description pt has obvious shoulder hiking on left side with any attempts to raise left arm      Posture/Postural Control   Posture/Postural Control Postural limitations   Postural Limitations Rounded Shoulders;Forward head;Decreased thoracic kyphosis   Posture Comments hiking of left shoulder with rounded protetive posture     ROM / Strength   AROM / PROM / Strength AROM;Strength     AROM   AROM Assessment Site Shoulder;Cervical   Right Shoulder Extension --   Right Shoulder Flexion 125 Degrees   Right Shoulder ABduction 95 Degrees   Right Shoulder Internal Rotation --   Right Shoulder External Rotation 60 Degrees   Left Shoulder Extension --   Left Shoulder Flexion 85 Degrees   Left Shoulder ABduction 60 Degrees   Left Shoulder Internal Rotation --   Left Shoulder External Rotation 60 Degrees   Cervical Flexion WNL   Cervical Extension WNL   Cervical - Right Side Bend WNL   Cervical - Left Side Bend WNL   Cervical - Right Rotation WNL   Cervical - Left Rotation WNL     Strength   Overall Strength Deficits   Overall Strength Comments 3-/5 in left and right shoulder limited by pain . decreased scapular stability on the left      Palpation   Palpation comment tenderness in left lateral scapular area with point tender trigger point in posterior axilla.  Possilbe guitar string in left antecubital fossa with dimpling in skin of left upper arm            LYMPHEDEMA/ONCOLOGY QUESTIONNAIRE - 05/25/17 1637      Right Upper Extremity Lymphedema   10 cm Proximal to Olecranon Process 23.5 cm   Olecranon Process 22 cm   10 cm Proximal to Ulnar Styloid Process 17.7 cm   Just Proximal to Ulnar Styloid Process 13 cm   Across Hand at [employer] 17.5 cm   At [place] of 2nd Digit 5 cm     Left Upper Extremity Lymphedema   10 cm Proximal to Olecranon  Process 23.5 cm   Olecranon Process 22.3 cm   10 cm Proximal to Ulnar Styloid Process 16.5 cm   Just Proximal to Ulnar Styloid Process 13 cm   Across Hand at [employer] 16.8 cm   At [place] of 2nd Digit 5 cm           Quick Dash - 05/25/17 0001    Open a tight or new jar Unable   Do heavy household chores (wash walls, wash floors) Unable   Carry a shopping bag or briefcase Moderate  difficulty   Wash your back Unable   Use a knife to cut food No difficulty   Recreational activities in which you take some force or impact through your arm, shoulder, or hand (golf, hammering, tennis) Mild difficulty   During the past week, to what extent has your arm, shoulder or hand problem interfered with your normal social activities with family, friends, neighbors, or groups? Quite a bit   During the past week, to what extent has your arm, shoulder or hand problem limited your work or other regular daily activities Modererately   Arm, shoulder, or hand pain. Moderate   Tingling (pins and needles) in your arm, shoulder, or hand None   Difficulty Sleeping Mild difficulty   DASH Score 52.27 %      Objective measurements completed on examination: See above findings.          [facility] PT Treatment/Exercise - 05/25/17 0001      Shoulder Exercises: Supine   Other Supine Exercises supine dowel flexion and abduction  5 reps    Other Supine Exercises supine with arms at 30 degrees in external rotation for scapular retraction                 PT Education - 05/25/17 1721    Education provided Yes   Education Details Encouraged pt to come to ABC class, supine dowel rod exercises, [employer] website for lymphedema risk reduction education    Person(s) Educated Patient   Methods Explanation   Comprehension Verbalized understanding           Short Term Clinic Goals - 05/25/17 1735      CC Short Term Goal  #1   Title pt will be knowledgable of lymphedema risk reduction  practices    Time 4   Period Weeks   Status New     CC Short Term Goal  #2   Title Pt will be independent in postural and shoulder stretching exercises   Time 4   Period Weeks   Status New             Long Term Clinic Goals - 05/25/17 1736      CC Long Term Goal  #1   Title Pt will have 140 degrees of left shoulder abduction so that she can received radiation treatment    Baseline 05/25/2017: 60 degrees    Time 8   Period Weeks   Status New     CC Long Term Goal  #2   Title Pt will ahve 150 degrees of right shoulder abduction so that she can perform household activities without difficulty    Baseline 05/25/2017: 95 degrees    Time 8   Period Weeks   Status New     CC Long Term Goal  #3   Title Pt will be independent in strength ABC program with knowledge of how to safely increase her intensity of exercise   Time 8   Period Weeks   Status New     CC Long Term Goal  #4   Title Pt will decrease Quick DASH score to < 20 indicating a function improvment of arms    Baseline 52.27 on 05/25/2017              Plan - 05/25/17 1725    Clinical Impression Statement Pt is here post bilateral mastectomy with immediate reconstruction presenting with pain , decreased strength and  decreased ROM in both arms, (  left more decreased and  painfult than right ).  She also has soft tissue and postural deficits and is at high risk for lymphedema with 21 lymph nodes removed.    History and Personal Factors relevant to plan of care: cares for 3 children at home, has been an avid exerciser and is now limited , anxious about upcoming treatment    Clinical Presentation Evolving   Clinical Presentation due to: plans to have chemotherapy, more surgery and then radiation    Clinical Decision Making Moderate   Rehab Potential Good   Clinical Impairments Affecting Rehab Potential 21 nodes removed in left axilla    PT Frequency 2x / week   PT Duration 8 weeks  may not be able to attend all  appointments due to other appoinments    PT Treatment/Interventions Therapeutic exercise;Patient/family education;ADLs/Self Care Home Management;Taping;DME Instruction;Dry needling;Functional mobility training;Therapeutic activities;Manual lymph drainage;Manual techniques;Passive range of motion   PT Next Visit Plan Review supine dowel exercise, teach Meeks decompression, PROM, scapular retraction retraining with mirror for visual cues, manual lymph drainage /if needed for cording, check out pt compression sleeves if she brings them in   Consulted and Agree with Plan of Care Patient      Patient will benefit from skilled therapeutic intervention in order to improve the following deficits and impairments:  Decreased range of motion, Impaired UE functional use, Pain, Decreased knowledge of precautions, Postural dysfunction, Decreased knowledge of use of DME, Decreased strength, Increased fascial restricitons, Impaired perceived functional ability  Visit Diagnosis: Aftercare following surgery for neoplasm  Stiffness of left shoulder, not elsewhere classified  Acute pain of left shoulder  Abnormal posture  Stiffness of right shoulder, not elsewhere classified  Pain of right shoulder joint on movement     Problem List Patient Active Problem List   Diagnosis Date Noted  . Breast cancer ([employer]) 04/27/2017  . Genetic testing 04/17/2017  . BRCA2 gene mutation positive 04/17/2017  . Family history of breast cancer   . Family history of ovarian cancer   . Breast cancer metastasized to axillary lymph node, left ([facility]) 03/29/2017  . Cough 03/10/2014  . Esophagitis 03/10/2014  . Chronic ITP (idiopathic thrombocytopenia) ([facility]CC) 03/10/2014  . ALLERGIC REACTION, ACUTE 07/22/2008   Donato Heinz. Owens Shark PT  Norwood Levo 05/25/2017, 5:41 PM  [place], [place], [address] Phone: [phone]   Fax:   [phone]  Name: Madison Savage MRN: [id number] Date of Birth: Jan 30, 1971

## 2017-05-25 NOTE — Telephone Encounter (Signed)
Pt called and discussed decision to have treatment at [facility]. Scheduled pt with Dr. Burr Medico on 05/26/17. Scheduled and confirmed echo 05/31/17 at 11am.

## 2017-05-25 NOTE — Patient Instructions (Signed)
SHOULDER: Flexion - Supine (Cane)        Cancer Rehab [phone]    Hold cane in both hands. Raise arms up overhead. Do not allow back to arch. Hold _5__ seconds. Do __5-10__ times; __1-2__ times a day.   SELF ASSISTED WITH OBJECT: Shoulder Abduction / Adduction - Supine    Hold cane with both hands. Move both arms from side to side, keep elbows straight.  Hold when stretch felt for __5__ seconds. Repeat __5-10__ times; __1-2__ times a day. Once this becomes easier progress to third picture bringing affected arm towards ear by staying out to side. Same hold for _5_seconds. Repeat  _5-10_ times, _1-2_ times/day.  Shoulder Blade Stretch    Clasp fingers behind head with elbows touching in front of face. Pull elbows back while pressing shoulder blades together. Relax and hold as tolerated, can place pillow under elbow here for comfort as needed and to allow for prolonged stretch.  Repeat __5__ times. Do __1-2__ sessions per day.     SHOULDER: External Rotation - Supine (Cane)    Hold cane with both hands. Rotate arm away from body. Keep elbow on floor and next to body. _5-10__ reps per set, hold 5 seconds, _1-2__ sets per day. Add towel to keep elbow at side.  Copyright  VHI. All rights reserved.      [email or web address].[email or web address].[employer] Courses Online Strength After Breast Cancer Look at the right of the page for Lymphedema Education Session

## 2017-05-25 NOTE — Progress Notes (Signed)
[facility]  Telephone:(336) [phone] Fax:([phone]  Clinic Follow up Note   Patient Care Team: Roderick Pee, MD as PCP - Joseph Pierini, MD as Consulting Physician (General Surgery) Malachy Mood, MD as Consulting Physician (Hematology) Dorothy Puffer, MD as Consulting Physician (Radiation Oncology) [name], MD as Consulting Physician (Obstetrics and Gynecology) 05/26/2017  CHIEF COMPLAINTS:  Follow up for Breast cancer metastasized to axillary lymph node, left   Oncology History   Cancer Staging Breast cancer metastasized to axillary lymph node, left [facility]) Staging form: Breast, AJCC 8th Edition - Clinical stage from 03/22/2017: Stage Unknown (cTX, cN1, cM0, GX, ER: Positive, PR: Positive, HER2: Negative) - Signed by Malachy Mood, MD on 03/29/2017 \     Breast cancer metastasized to axillary lymph node, left (HCC)   03/21/2017 Mammogram    Diagnostic mammogram bilateral 03/21/17 IMPRESSION: INCOMPLETE - ADDITIONAL IMAGING EVALUATION NEEDED 1. The 0.6 cm oval mass in the right breast at 11 o'clock middle depth is indeterminate. An ultrasound is recommended.  2. The 0.5cm round focal asymmetry in the left breast central to the nipple middle depth is indeterminate. An ultrasound is recommended.  3. Ultrasound of the palpable abnormalities in the axilla and far left lateral breast is recommended.  4. Multiple clusters of pleomorphic calcifications in the left upper outer breast anterior depth spanning a 2.5 cm area are highly suggestive of malignancy. A stereotactic biopsy is recommended.        03/21/2017 Imaging    US Breast bilateral 03/21/17 IMPRESSION: HIGHLY SUGGESTIVE OF MALIGNANCY 1. Six distinct abnormal left axillary nodes, 2 of which correspond to the palpable lumps felt by the patient. Findings concerning for metastatic adenopathy. Ultrasound guided biopsy of one of these nodes is recommended.  2. Small 5 mm palpable, oval mass in the left breast  3:00 areolar margin is at an intermediate suspicion. Ultrasound guided biopsy is recommended.  3. Isoechoic 8 mm mass in the right breast 10:00 5 cm from the nipple is also an intermediate suspicion for malignancy, and ultrasound guided biopsy is recommended.  4. Stereotactic guided biopsy for the highly suspicious calcifications in the left breast also recommended as detailed in the mammography report.        03/22/2017 Initial Biopsy    Diagnosis 03/22/17 Lymph node, needle/core biopsy, left axillary node -METASTATIC CARCINOMA, SEE COMMENT Microscopic Comment      03/22/2017 Receptors her2    Lymph node biopsy ER 95% positive, strong staining, PR 5% positive, weak staining, HER-2 negative, with HER2/CEP17 ratio 1.23 and copy #3.95.      03/27/2017 Pathology Results    Diagnosis 03/27/17 1. Breast, left, needle core biopsy -FIBROADENOMATOID NODULE WITH CALCIFICATIONS 2.Breast, left, needle core biopsy -DUCTAL CARCINOMA IN SITU, HIGH GRADE -SEE COMMENT  3. Breast, right, needle, core biopsy -FIBROADENOMA -NO MALIGNANCY IDENTIFIED       03/29/2017 Initial Diagnosis    Breast cancer metastasized to axillary lymph node, left (HCC)     04/07/2017 Imaging    CT CAP 04/07/17 IMPRESSION: 1. Several prominent left axillary lymph nodes which may correspond to biopsy proven axillary lymph node metastasis. 2. No thoracic adenopathy identified or evidence of distant metastatic disease.      04/07/2017 Imaging    Bone whole body scan 04/07/17 IMPRESSION: Negative for evidence of osseous metastatic disease.      04/17/2017 Genetic Testing    BRCA2 c.[id number] (p.Glu260Serfs*15) pathogenic mutation identified on the 9 gene STAT panel.  The STAT Breast cancer panel  offered by [facility] includes sequencing and rearrangement analysis for the following 9 genes:  ATM, BRCA1, BRCA2, CDH1, CHEK2, PALB2, PTEN, STK11 and TP53.   The report date is April 17, 2017.  UPDATE: BRCA2 c.[id number]  (p.Glu260Serfs*15) pathogenic mutation and NF1 c.1166A>G (p.His389Arg) VUS identified on the common hereditary cancer panel.  The Hereditary Gene Panel offered by [facility] includes sequencing and/or deletion duplication testing of the following 46 genes: APC, ATM, AXIN2, BARD1, BMPR1A, BRCA1, BRCA2, BRIP1, CDH1, CDKN2A (p14ARF), CDKN2A (p16INK4a), CHEK2, CTNNA1, DICER1, EPCAM (Deletion/duplication testing only), GREM1 (promoter region deletion/duplication testing only), KIT, MEN1, MLH1, MSH2, MSH3, MSH6, MUTYH, NBN, NF1, NHTL1, PALB2, PDGFRA, PMS2, POLD1, POLE, PTEN, RAD50, RAD51C, RAD51D, SDHB, SDHC, SDHD, SMAD4, SMARCA4. STK11, TP53, TSC1, TSC2, and VHL.  The following genes were evaluated for sequence changes only: SDHA and HOXB13 c.251G>A variant only.  The report date is April 29, 2017.       04/27/2017 Surgery    Surgey 04/27/17 BILATERAL SKIN SPARING MASTECTOMY and LEFT AXILLARY LYMPH NODE and BILATERAL BREAST RECONSTRUCTION WITH PLACEMENT OF TISSUE EXPANDER AND FLEX HD (ACELLULAR HYDRATED DERMIS) Bilateral BY Dr. Barry Dienes and Dr. Marla Roe       04/27/2017 Pathology Results     Diagnosis  04/27/17 1. Breast, simple mastectomy, Right - FIBROCYSTIC CHANGES WITH ADENOSIS. - USUAL DUCTAL HYPERPLASIA. - HEALING BIOPSY SITE. - THERE IS NO EVIDENCE OF MALIGNANCY. 2. Breast, simple mastectomy, Left - INVASIVE DUCTAL CARCINOMA, GRADE II/III, SPANNING 1.2 CM. - DUCTAL CARCINOMA IN SITU WITH CALCIFICATIONS, HIGH GRADE. - INVASIVE DUCTAL CARCINOMA IS FOCALLY PRESENT AT THE ANTERIOR MARGIN. - DUCTAL CARCINOMA IN SITU IS BROADLY 0.1 CM TO THE POSTERIOR MARGIN. - SEE ONCOLOGY TABLE BELOW. 3. Lymph nodes, regional resection, Left axillary - METASTATIC CARCINOMA IN 8 OF 21 LYMPH NODES (8/21). - SEE COMMENT        HISTORY OF PRESENTING ILLNESS: 03/29/17 Madison Savage 46 y.o. female is here because of newly diagnosed Breast cancer metastasized to axillary lymph node, left. She presents to Breast  Clinic today with her husband. She felt the lump herself June 22nd. It was soft and "squishy" and it moved. First in the left breast and then four days latter in the left axilla. She feels they have gotten smaller.   In the past she was diagnosed with being ITP positive. Period has been irregular more since her last miscarriage in 2015. She has MTHFR gene. In pregnancy her plt counts have gone low. She has not seen homologist for this. All 3 children were vaginal births.  Today she reports that with the lumps she feels a dull ache that comes and goes. She has not noticed any nipple or skin change. She had not had a mammogram since she was 20. Her PCP was going to set her up with a mammogram last year. Dr. Ronita Hipps is her OB and Dr. Sherren Mocha is her current PCP. She feels no change in appetite or weight loss. She quit smoking 3 weeks ago. She works out Scientist, product/process development. She reports her last attempt at the MRI, the contrast made her feel like she could not breathe, her head was pounding and she felt hot all over and she was shaking. The trouble breathing only lasted while she was in the MRI.  Husband shared concern with insurance and financial. And referrals to second opinions.    GYN HISTORY  Menarchal: 16 LMP: 03/05/2017 Contraceptive: none HRT: N/A G7P3: - 3 miscarriages, 1 abortion    CURRENT THERAPY: PENDING  Adriamycin and Cytoxan  every 2 weeks, for 4 cycles, followed by weekly Taxol or Abraxane and carboplatin for 12 weeks.  INTERVAL HISTORY:  Madison Savage is here for a follow up post bilateral mastectomy. She presents to the clinic today with her husband to discuss adjuvant chemo. She underwent a bilateral mastectomy on 04/27/2017, with tissue expanders. She is recovering well from her surgery, has minimal pain at the surgical incision, she has started physical therapy, no lymphedema, no other new complaints.   MEDICAL HISTORY:  Past Medical History:  Diagnosis Date  . Cancer ([place]) 03/2017    left breast cancer  . Chronic ITP (idiopathic thrombocytopenia) (HCC)   . Family history of breast cancer   . Family history of ovarian cancer   . MTHFR mutation [facility])     SURGICAL HISTORY: Past Surgical History:  Procedure Laterality Date  . AXILLARY LYMPH NODE DISSECTION Left 04/27/2017   Procedure: AXILLARY LYMPH NODE DISSECTION;  Surgeon: [name], MD;  Location: [place];  Service: General;  Laterality: Left;  . BILATERAL TOTAL MASTECTOMY WITH AXILLARY LYMPH NODE DISSECTION Bilateral 04/27/2017   Procedure: SKIN SPARING MASTECTOMY;  Surgeon: [name], MD;  Location: [place];  Service: General;  Laterality: Bilateral;  . BREAST RECONSTRUCTION WITH PLACEMENT OF TISSUE EXPANDER AND FLEX HD (ACELLULAR HYDRATED DERMIS) Bilateral 04/27/2017   Procedure: BILATERAL BREAST RECONSTRUCTION WITH PLACEMENT OF TISSUE EXPANDER AND FLEX HD (ACELLULAR HYDRATED DERMIS);  Surgeon: Wallace Going, DO;  Location: [place];  Service: Plastics;  Laterality: Bilateral;  . WISDOM TOOTH EXTRACTION      SOCIAL HISTORY: Social History   Social History  . Marital status: Married    Spouse name: N/A  . Number of children: 3  . Years of education: N/A   Occupational History  .  Unemployed   Social History Main Topics  . Smoking status: Former Smoker    Packs/day: 0.50    Years: 20.00    Types: Cigarettes    Quit date: 03/05/2017  . Smokeless tobacco: Never Used  . Alcohol use No  . Drug use: No  . Sexual activity: Yes    Partners: Male    Birth control/ protection: None   Other Topics Concern  . Not on file   Social History Narrative   Stay at home mom   3 children ages 72, 69, 14    FAMILY HISTORY: Family History  Problem Relation Age of Onset  . Breast cancer Paternal Aunt 65  . Ovarian cancer Paternal Aunt 14  . Cancer Paternal Grandmother        breast cancer   . Cancer Maternal Aunt        lymphoma   . Cancer  Paternal Grandfather        throat cancer   . Cancer Paternal Aunt        lung cancer  . Cirrhosis Father   . AAA (abdominal aortic aneurysm) Maternal Grandfather   . Leukemia Paternal Aunt     ALLERGIES:  is allergic to clindamycin; penicillins; and codeine.  MEDICATIONS:  Current Outpatient Prescriptions  Medication Sig Dispense Refill  . diazepam (VALIUM) 2 MG tablet Take 2 mg by mouth every 6 (six) hours as needed for anxiety.     Marland Kitchen HYDROcodone-acetaminophen (NORCO/VICODIN) 5-325 MG tablet Take 1 tablet by mouth every 4 (four) hours as needed for moderate pain.     . vitamin C (ASCORBIC ACID) 500 MG tablet Take 2,000 mg by mouth daily.     Marland Kitchen  Cyanocobalamin (VITAMIN B-12 PO) Take 1 tablet by mouth daily.     Marland Kitchen LORazepam (ATIVAN) 0.5 MG tablet Take 1- 2 tablets by mouth as needed for anxiety (Patient not taking: Reported on 05/26/2017) 30 tablet 1  . Multiple Vitamins-Calcium (ONE-A-DAY WOMENS PO) Take 1 tablet by mouth daily.    Marland Kitchen OVER THE COUNTER MEDICATION Take 1 capsule by mouth daily. Biotin/MSM     No current facility-administered medications for this visit.     REVIEW OF SYSTEMS:   Constitutional: Denies fevers, chills or abnormal night sweats Eyes: Denies blurriness of vision, double vision or watery eyes Ears, nose, mouth, throat, and face: Denies mucositis or sore throat Respiratory: Denies cough, dyspnea or wheezes Cardiovascular: Denies palpitation, chest discomfort or lower extremity swelling Gastrointestinal:  Denies nausea, heartburn or change in bowel habits Skin: Denies abnormal skin rashes Lymphatics: Denies new lymphadenopathy or easy bruising Neurological:Denies numbness, tingling or new weaknesses Behavioral/Psych: Mood is stable, no new changes  All other systems were reviewed with the patient and are negative. Breast: (+) soft, palpable lumps in left breast and left axilla  PHYSICAL EXAMINATION: ECOG PERFORMANCE STATUS: 0 - Asymptomatic  Vitals:    05/26/17 1125  BP: 101/76  Pulse: 77  Resp: 18  Temp: 98.7 F (37.1 C)  SpO2: 100%   Filed Weights   05/26/17 1125  Weight: 116 lb 1.6 oz (52.7 kg)   GENERAL:alert, no distress and comfortable SKIN: skin color, texture, turgor are normal, no rashes or significant lesions EYES: normal, conjunctiva are pink and non-injected, sclera clear OROPHARYNX:no exudate, no erythema and lips, buccal mucosa, and tongue normal  NECK: supple, thyroid normal size, non-tender, without nodularity LYMPH:  no palpable lymphadenopathy in the cervical, axillary or inguinal LUNGS: clear to auscultation and percussion with normal breathing effort HEART: regular rate & rhythm and no murmurs and no lower extremity edema ABDOMEN:abdomen soft, non-tender and normal bowel sounds Musculoskeletal:no cyanosis of digits and no clubbing  PSYCH: alert & oriented x 3 with fluent speech NEURO: no focal motor/sensory deficits Breast: Status post bilateral mastectomy and tissue expander placements, surgical incisions have healed well, no discharge or skin erythema.  LABORATORY DATA:  I have reviewed the data as listed CBC Latest Ref Rng & Units 03/29/2017 08/27/2010 08/26/2010  WBC 3.9 - 10.3 10e3/uL 5.2 12.0(H) 11.5(H)  Hemoglobin 11.6 - 15.9 g/dL 14.1 10.8 DELTA CHECK NOTED(L) 13.1  Hematocrit 34.8 - 46.6 % 41.7 32.8(L) 38.4  Platelets 145 - 400 10e3/uL 158 122(L) 149(L)    CMP Latest Ref Rng & Units 03/29/2017 07/26/2008  Glucose 70 - 140 mg/dl 91 175(H)  BUN 7.0 - 26.0 mg/dL 14.5 5(L)  Creatinine 0.6 - 1.1 mg/dL 0.9 0.68  Sodium 136 - 145 mEq/L 140 139  Potassium 3.5 - 5.1 mEq/L 4.4 4.4  Chloride - - 108  CO2 22 - 29 mEq/L 27 26  Calcium 8.4 - 10.4 mg/dL 9.6 8.3(L)  Total Protein 6.4 - 8.3 g/dL 6.6 -  Total Bilirubin 0.20 - 1.20 mg/dL 0.32 -  Alkaline Phos 40 - 150 U/L 55 -  AST 5 - 34 U/L 12 -  ALT 0 - 55 U/L 12 -   PATHOLOGY   Diagnosis  04/27/17 1. Breast, simple mastectomy, Right - FIBROCYSTIC  CHANGES WITH ADENOSIS. - USUAL DUCTAL HYPERPLASIA. - HEALING BIOPSY SITE. - THERE IS NO EVIDENCE OF MALIGNANCY. 2. Breast, simple mastectomy, Left - INVASIVE DUCTAL CARCINOMA, GRADE II/III, SPANNING 1.2 CM. - DUCTAL CARCINOMA IN SITU WITH CALCIFICATIONS, HIGH GRADE. -  INVASIVE DUCTAL CARCINOMA IS FOCALLY PRESENT AT THE ANTERIOR MARGIN. - DUCTAL CARCINOMA IN SITU IS BROADLY 0.1 CM TO THE POSTERIOR MARGIN. - SEE ONCOLOGY TABLE BELOW. 3. Lymph nodes, regional resection, Left axillary - METASTATIC CARCINOMA IN 8 OF 21 LYMPH NODES (8/21). - SEE COMMENT. Microscopic Comment 2. BREAST, INVASIVE TUMOR Procedure: Simple mastectomy and axillary lymph node resections. Laterality: Left. Tumor Size: 1.2 cm (glass slide measurement). Histologic Type: Ductal Grade: II Tubular Differentiation: 3 Nuclear Pleomorphism: 3 Mitotic Count: 1 Ductal Carcinoma in Situ (DCIS): Present, high grade, extensive. Extent of Tumor: Confined to breast parenchyma. Margins: Invasive carcinoma, distance from closest margin: Focally present at the anterior margin. DCIS, distance from closest margin: Broadly less than 0.1 cm to the posterior margin. Regional Lymph Nodes: Number of Lymph Nodes Examined: 21 1 of 4 FINAL for META, KROENKE [phone]) Microscopic Comment(continued) Lymph Nodes with Macrometastases: 6 Lymph Nodes with Micrometastases: 0 Lymph Nodes with Isolated Tumor Cells: 2 Breast Prognostic Profile: [id number] Estrogen Receptor: 95%, strong. Progesterone Receptor: 5%, strong. Her2: No amplification was detected. The ratio is 1.23. Ki-67: 15%. Best tumor block for sendout testing: 2G Pathologic Stage Classification (pTNM, AJCC 8th Edition): Primary Tumor (pT): pT1c Regional Lymph Nodes (pN): pN2a Distant Metastases (pM): pMX Comments: Grossly, there is a 2.5 cm focus of indurated tissue identified. Histologic examination of this area reveals predominately high grade ductal carcinoma  in situ with calcifications. There is associated invasive ductal carcinoma, which is E-cadherin positive, spanning 1.2 cm. (JBK:gt, 05/02/17) 3. Immunohistochemical stains for cytokeratin AE1/AE3 performed on multiple blocks on part 3 help highlight the presence of metastatic carcinoma. (JBK:gt, 05/02/17)   Diagnosis 03/27/17 1. Breast, left, needle core biopsy -FIBROADENOMATOID NODULE WITH CALCIFICATIONS 2.Breast, left, needle core biopsy -DUCTAL CARCINOMA IN SITU, HIGH GRADE -SEE COMMENT  3. Breast, right, needle, core biopsy -FIBROADENOMA -NO MALIGNANCY IDENTIFIED  Diagnosis 03/22/17 Lymph node, needle/core biopsy, left axillary node -METASTATIC CARCINOMA, SEE COMMENT Microscopic Comment Sections showed lymph node tissue mostly replaced by metastatic carcinoma characterized by sheets of epithelioid appearing cells displaying milder to moderate nuclear pleomorphism, fine chromatin, small nucleoli, and moderately abundant amphophilic to eosinophilic cyto-plasma. Immunohistochemistry staining showed positive for cytokeratin AE1/AE3, GATA-3 and estrogen receptor, with patchy positivity for cytokeratin 7 and GCDFP., Only scattered cells showed progesterone positivity. Negative for S100, melan-a, E-cadherin, cytokeratin 20, CDX-2, TTF-1 or WT-1. The findings are consistent with metastatic carcinoma of breast primary. Lack of significant e-cadherin positivity suggested lobular carcinoma.   GENETICS 04/10/17    RADIOGRAPHIC STUDIES: I have personally reviewed the radiological images as listed and agreed with the findings in the report.  CT CAP 04/07/17 IMPRESSION: 1. Several prominent left axillary lymph nodes which may correspond to biopsy proven axillary lymph node metastasis. 2. No thoracic adenopathy identified or evidence of distant metastatic disease.   Bone whole body scan 04/07/17 IMPRESSION: Negative for evidence of osseous metastatic disease.  Diagnostic mammogram bilateral  03/21/17 IMPRESSION: INCOMPLETE - ADDITIONAL IMAGING EVALUATION NEEDED 1. The 0.6 cm oval mass in the right breast at 11 o'clock middle depth is indeterminate. An ultrasound is recommended.  2. The 0.5cm round focal asymmetry in the left breast central to the nipple middle depth is indeterminate. An ultrasound is recommended.  3. Ultrasound of the palpable abnormalities in the axilla and far left lateral breast is recommended.  4. Multiple clusters of pleomorphic calcifications in the left upper outer breast anterior depth spanning a 2.5 cm area are highly suggestive of malignancy. A stereotactic biopsy is recommended.  US Breast bilateral 03/21/17 IMPRESSION: HIGHLY SUGGESTIVE OF MALIGNANCY 1. Six distinct abnormal left axillary nodes, 2 of which correspond to the palpable lumps felt by the patient. Findings concerning for metastatic adenopathy. Ultrasound guided biopsy of one of these nodes is recommended.  2. Small 5 mm palpable, oval mass in the left breast 3:00 areolar margin is at an intermediate suspicion. Ultrasound guided biopsy is recommended.  3. Isoechoic 8 mm mass in the right breast 10:00 5 cm from the nipple is also an intermediate suspicion for malignancy, and ultrasound guided biopsy is recommended.  4. Stereotactic guided biopsy for the highly suspicious calcifications in the left breast also recommended as detailed in the mammography report.    ASSESSMENT & PLAN:  Madison Savage is a 46 y.o. perimenopausal female with a history of Chronic ITP and MTHFR mutation, presented with a palpable left axillary adenopathy.  1. 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 She had a 1.2 cm left breast invasive ductal  carcinoma, with 8 positive lymph nodes. Surgical margins were negative. -We discussed the risk of cancer recurrence after surgery. Due to her multiple positive lymph nodes, young age, she has high risk for recurrence.  -I recommended adjuvant chemotherapy to reduce her risk of recurrence. I recommend standard dose dense Adriamycin and Cytoxan every 2 weeks, for 4 cycles, followed by weekly Taxol or Abraxane for 12 weeks. Due to her BRCA2 mutation, she probably would benefit from carboplatin also, I plan to add [place] to weekly Taxol. --Chemotherapy consent: Side effects including but does not not limited to, fatigue, nausea, vomiting, diarrhea, hair loss, neuropathy, fluid retention, renal and kidney dysfunction, neutropenic fever, needed for blood transfusion, bleeding, congestive heart failure, risk of secondary cancers especially leukemia and MDS, were discussed with patient in great detail. She agrees to proceed.  -The goal of therapy is curative -We discussed chemotherapy induced infertility today. Patient and her husband has decided not to have more children. -Due to the multiple positive axillary lymph node, she would also benefit from adjuvant radiation. She was seen by Dr. Lisbeth Renshaw before. -Given the ER/PR positive disease, I would strongly recommend adjuvant antiestrogen therapy. Due to her BRCA2 mutation, she is agreeable to have hysterectomy and BSO, I'll start her on aromatase inhibitor after her radiation -We also discussed breast cancer surveillance after she completes treatment. -A baseline echocardiogram is scheduled for next week  -Plan to start first cycle chemotherapy on 10/5 -We discussed side effects from Neulasta, she knows to take Claritin to prevent bone pain. -Pt would like to purchase cold cap and try during her chemo, to prevent hair loss  -Patient takes multiple supplements, okay to continue multivitamin and vitamin C, and hold other supplements during her chemotherapy.  2.  Chronic ITP  -Discovered in her Last pregnancy, with platelet count dropped to between 50,000 and 60,000, recovered, her platelet counts is around 120-150K lately  -She does not follow up with hematologist  -I'm concerned her platelet counts will drop significantly with chemotherapy, she may need steroids or additional treatment for her ITP.  3. 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 -She underwent genetic testing for her breast cancer, which showed a pathological BRCA2 mutation  and NF1 VUS mutation -We discussed that BRCA2 mutation predicts significant high risk of breast cancer and colon cancer, and a prophylactic surgeries are recommended. -She has had bilateral mastectomy, due to the high-risk of breast cancer -She planned to have hysterectomy and BSO after she completes chemo and radiation  -Patient has met our genetic counselor Mrs. Florene Glen, BRCA2 G mutation screening in her children and siblings were recommended.   PLAN:  -Lab, rash, follow-up and for cycle chemotherapy AC with neulasta on 10/5 -I will call in emla cream, Zofran and Compazine. -she Plans to purchase cold cap and use it during her chemotherapy to prevent hair loss   Orders Placed This Encounter  Procedures  . CBC with Differential    Standing Status:   Standing    Number of Occurrences:   50    Standing Expiration Date:   05/25/2022  . Comprehensive metabolic panel    Standing Status:   Standing    Number of Occurrences:   50    Standing Expiration Date:   05/25/2022  . CA 27.29    Standing Status:   Standing    Number of Occurrences:   50    Standing Expiration Date:   05/25/2022    All questions were answered. The patient knows to call the clinic with any problems, questions or concerns. I spent 30 minutes counseling the patient face to face. The total time spent in the appointment was 40 minutes and more than  50% was on counseling.     [name], MD 05/26/2017    This document serves as a record of services personally performed by [name], MD. It was created on her behalf by Joslyn Devon, a trained medical scribe. The creation of this record is based on the scribe's personal observations and the provider's statements to them. This document has been checked and approved by the attending provider.

## 2017-05-26 ENCOUNTER — Encounter: Payer: Self-pay | Admitting: Family Medicine

## 2017-05-26 ENCOUNTER — Ambulatory Visit (HOSPITAL_BASED_OUTPATIENT_CLINIC_OR_DEPARTMENT_OTHER): Payer: BLUE CROSS/BLUE SHIELD | Admitting: Hematology

## 2017-05-26 ENCOUNTER — Telehealth: Payer: Self-pay | Admitting: Hematology

## 2017-05-26 VITALS — BP 101/76 | HR 77 | Temp 98.7°F | Resp 18 | Ht 67.0 in | Wt 116.1 lb

## 2017-05-26 DIAGNOSIS — Z1501 Genetic susceptibility to malignant neoplasm of breast: Secondary | ICD-10-CM

## 2017-05-26 DIAGNOSIS — C50912 Malignant neoplasm of unspecified site of left female breast: Secondary | ICD-10-CM

## 2017-05-26 DIAGNOSIS — D693 Immune thrombocytopenic purpura: Secondary | ICD-10-CM | POA: Diagnosis not present

## 2017-05-26 DIAGNOSIS — C773 Secondary and unspecified malignant neoplasm of axilla and upper limb lymph nodes: Secondary | ICD-10-CM

## 2017-05-26 DIAGNOSIS — Z170 Estrogen receptor positive status [ER+]: Secondary | ICD-10-CM

## 2017-05-26 DIAGNOSIS — Z1509 Genetic susceptibility to other malignant neoplasm: Secondary | ICD-10-CM

## 2017-05-26 NOTE — Telephone Encounter (Signed)
Scheduled appt per 9/21 los - Gave patient AVS and calender per los.  

## 2017-05-26 NOTE — H&P (Signed)
  Madison Savage 05/15/2017 1:36 PM Location: [facility] Patient #: [id number] DOB: 1970/09/16 Married / Language: Madison Savage / Race: White Female   History of Present Illness Madison Klein MD; 05/19/2017 1:35 PM) The patient is a 46 year old female who presents for a follow-up for Breast cancer. Pt is a 46 yo F diagnosed with left breast cancer july 2018. She palpated a mass in the upper outer quadrant of her breast and in the axillary tail. She immediately went to see her PCP who got her in for imaging at [facility] She was seen to have 6 abnormal lymph nodes, but only calcifications in the breast were seen. She had not had a mammogram since she was 66. She had a 5 mm mass at 3 o'clock near the areola and 2.5 cm of calcs in the upper outer quadrant. 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 The highest LN visible was biopsied and was positive for metastatic lobular cancer, ER +/weak PR + and her 2 negative. Ki 67 was not available at this time. An MRI was attempted, but she felt very hot, had nausea, and chest tightness and was not able to complete MRI.   The patient was found to have a genetic defect, BRCA2. She underwent bilateral mastectomies with left ALND 04/27/2017. This was followed by expander reconstruction with Dr. Marla Savage. She has been fairly sore, but has been keeping up with the drains. Energy level has been improving. She will need chemotherapy. She is going to get this at [facility] due to cold cap being available.   pathology 04/27/2017 Diagnosis 1. Breast, simple mastectomy, Right - FIBROCYSTIC CHANGES WITH ADENOSIS. - USUAL DUCTAL HYPERPLASIA. - HEALING BIOPSY SITE. - THERE IS NO EVIDENCE OF MALIGNANCY. 2. Breast, simple mastectomy, Left - INVASIVE DUCTAL CARCINOMA, GRADE II/III, SPANNING 1.2 CM. - DUCTAL CARCINOMA IN SITU WITH CALCIFICATIONS, HIGH GRADE. - INVASIVE DUCTAL CARCINOMA IS FOCALLY PRESENT AT THE ANTERIOR MARGIN. - DUCTAL CARCINOMA IN SITU IS  BROADLY 0.1 CM TO THE POSTERIOR MARGIN. - SEE ONCOLOGY TABLE BELOW. 3. Lymph nodes, regional resection, Left axillary - METASTATIC CARCINOMA IN 8 OF 21 LYMPH NODES (8/21). - SEE COMMENT.    Allergies (Madison Savage, CMA; 05/15/2017 1:36 PM) Codeine and Related  Penicillins  Anaphylaxis. Clindamycin  Anaphylaxis. Allergies Reconciled   Medication History (Madison Savage, CMA; 05/15/2017 1:36 PM) LORazepam (0.5MG Tablet, Oral) Active. Cyanocobalamin (Oral) Specific strength unknown - Active. Echinacea (125MG Tablet, Oral) Active. One-A-Day Womens Formula (Oral) Active. Medications Reconciled    Review of Systems Madison Klein MD; 05/19/2017 1:35 PM) All other systems negative  Vitals (Madison Savage CMA; 05/15/2017 1:37 PM) 05/15/2017 1:36 PM Weight: 118.13 lb Height: 67in Body Surface Area: 1.62 m Body Mass Index: 18.5 kg/m  Temp.: 97.62F(Oral)  Pulse: 85 (Regular)  BP: 100/64 (Sitting, Left Arm, Standard)       Physical Exam Madison Klein MD; 05/19/2017 1:36 PM) Breast Note: skin looks well perfused. some indention in the left axilla at site of ALND. no erythema.     Assessment & Plan Madison Klein MD; 05/19/2017 1:37 PM) CARCINOMA OF LEFT BREAST METASTATIC TO AXILLARY LYMPH NODE (C50.912) Impression: Will place port. Pt has appts with [facility] to determine when chemo will start.  Discussed port again with pt.  She also has follow up wtih Dr. Marla Savage. Current Plans Pt Education - c[facility] port insertion education Schedule for Surgery Referred to Physical Therapy, for evaluation and follow up (Physical Therapy). Routine.

## 2017-05-27 ENCOUNTER — Encounter: Payer: Self-pay | Admitting: Hematology

## 2017-05-27 MED ORDER — ONDANSETRON HCL 8 MG PO TABS: 8.0000 mg | ORAL_TABLET | Freq: Two times a day (BID) | ORAL | 1 refills | Status: DC | PRN

## 2017-05-27 MED ORDER — PROCHLORPERAZINE MALEATE 10 MG PO TABS: 10.0000 mg | ORAL_TABLET | Freq: Four times a day (QID) | ORAL | 1 refills | Status: DC | PRN

## 2017-05-27 MED ORDER — LIDOCAINE-PRILOCAINE 2.5-2.5 % EX CREA: TOPICAL_CREAM | CUTANEOUS | 3 refills | Status: DC

## 2017-05-27 NOTE — Progress Notes (Signed)
START ON PATHWAY REGIMEN - Breast   Doxorubicin + Cyclophosphamide (AC):   A cycle is every 21 days:     Doxorubicin      Cyclophosphamide   **Always confirm dose/schedule in your pharmacy ordering system**    Paclitaxel 80 mg/m2 Weekly:   Administer weekly:     Paclitaxel   **Always confirm dose/schedule in your pharmacy ordering system**    Patient Characteristics: Postoperative without Neoadjuvant Therapy (Pathologic Staging), Invasive Disease, Adjuvant Therapy, HER2 Negative/Unknown/Equivocal, ER Positive, Node Positive, Node Positive (4+) Therapeutic Status: Postoperative without Neoadjuvant Therapy (Pathologic Staging) AJCC Grade: G2 AJCC N Category: pN2a AJCC M Category: cM0 ER Status: Positive (+) AJCC 8 Stage Grouping: IB HER2 Status: Negative (-) Oncotype Dx Recurrence Score: Not Appropriate AJCC T Category: pT1c PR Status: Positive (+) Intent of Therapy: Curative Intent, Discussed with Patient 

## 2017-05-30 ENCOUNTER — Encounter (HOSPITAL_COMMUNITY)
Admission: RE | Admit: 2017-05-30 | Discharge: 2017-05-30 | Disposition: A | Discharge status: Home / Self Care | Payer: BLUE CROSS/BLUE SHIELD | Source: Ambulatory Visit | Attending: General Surgery | Admitting: General Surgery

## 2017-05-30 ENCOUNTER — Encounter (HOSPITAL_COMMUNITY): Payer: Self-pay

## 2017-05-30 DIAGNOSIS — Z853 Personal history of malignant neoplasm of breast: Secondary | ICD-10-CM | POA: Diagnosis not present

## 2017-05-30 DIAGNOSIS — C50912 Malignant neoplasm of unspecified site of left female breast: Secondary | ICD-10-CM | POA: Diagnosis not present

## 2017-05-30 DIAGNOSIS — Z170 Estrogen receptor positive status [ER+]: Secondary | ICD-10-CM | POA: Diagnosis not present

## 2017-05-30 DIAGNOSIS — R011 Cardiac murmur, unspecified: Secondary | ICD-10-CM | POA: Diagnosis not present

## 2017-05-30 HISTORY — DX: Anxiety disorder, unspecified: F41.9

## 2017-05-30 HISTORY — DX: Headache: R51

## 2017-05-30 HISTORY — DX: Headache, unspecified: R51.9

## 2017-05-30 HISTORY — DX: Anemia, unspecified: D64.9

## 2017-05-30 LAB — BASIC METABOLIC PANEL
Anion gap: 4 — ABNORMAL LOW (ref 5–15)
BUN: 17 mg/dL (ref 6–20)
CO2: 27 mmol/L (ref 22–32)
Calcium: 9.4 mg/dL (ref 8.9–10.3)
Chloride: 106 mmol/L (ref 101–111)
Creatinine, Ser: 0.8 mg/dL (ref 0.44–1.00)
GFR calc Af Amer: >60 mL/min (ref >60)
GFR calc non Af Amer: >60 mL/min (ref >60)
Glucose, Bld: 106 mg/dL — ABNORMAL HIGH (ref 65–99)
Potassium: 4.4 mmol/L (ref 3.5–5.1)
Sodium: 137 mmol/L (ref 135–145)

## 2017-05-30 LAB — CBC
HCT: 40.5 % (ref 36.0–46.0)
Hemoglobin: 13.6 g/dL (ref 12.0–15.0)
MCH: 30.5 pg (ref 26.0–34.0)
MCHC: 33.6 g/dL (ref 30.0–36.0)
MCV: 90.8 fL (ref 78.0–100.0)
Platelets: 188 10*3/uL (ref 150–400)
RBC: 4.46 MIL/uL (ref 3.87–5.11)
RDW: 13.1 % (ref 11.5–15.5)
WBC: 8.1 10*3/uL (ref 4.0–10.5)

## 2017-05-30 LAB — HCG, SERUM, QUALITATIVE: Preg, Serum: NEGATIVE

## 2017-05-30 NOTE — Pre-Procedure Instructions (Signed)
Madison Savage  05/30/2017      [id number], [place] [address] [address] Phone: [phone] Fax: [phone]    Your procedure is scheduled on  Sept. 27th, Thursday.   Report to [facility] Admitting at 5:30 am.             (posted surgery time 7:30a-830a)              Call this number if you have problems the MORNING of surgery:  [phone]   Remember:  Do not eat food or drink liquids after midnight Wednesday.              As of today (9/25, please STOP taking any anti-inflammatories, vitamins, herbal supplements.   Take these medicines the morning of surgery with A SIP OF WATER : Valium   Do not wear jewelry, make-up or nail polish.  Do not wear lotions, powders,  perfumes, or deoderant.  Do not shave 48 hours prior to surgery.    Do not bring valuables to the hospital.  [facility] is not responsible for any belongings or valuables.  Contacts, dentures or bridgework may not be worn into surgery.  Leave your suitcase in the car.  After surgery it may be brought to your room.  Patients discharged the day of surgery will not be allowed to drive home, and will need someone to stay with you for the first 24 hrs after,  Please read over the following fact sheets that you were given. Pain Booklet and Surgical Site Infection Prevention      [place]- Preparing For Surgery  Before surgery, you can play an important role. Because skin is not sterile, your skin needs to be as free of germs as possible. You can reduce the number of germs on your skin by washing with CHG (chlorahexidine gluconate) Soap before surgery.  CHG is an antiseptic cleaner which kills germs and bonds with the skin to continue killing germs even after washing.  Please do not use if you have an allergy to CHG or antibacterial soaps. If your skin becomes reddened/irritated stop using the CHG.  Do not shave (including legs and  underarms) for at least 48 hours prior to first CHG shower. It is OK to shave your face.  Please follow these instructions carefully.   1. Shower the NIGHT BEFORE SURGERY and the MORNING OF SURGERY with CHG.   2. If you chose to wash your hair, wash your hair first as usual with your normal shampoo.  3. After you shampoo, rinse your hair and body thoroughly to remove the shampoo.  4. Use CHG as you would any other liquid soap. You can apply CHG directly to the skin and wash gently with a scrungie or a clean washcloth.   5. Apply the CHG Soap to your body ONLY FROM THE NECK DOWN.  Do not use on open wounds or open sores. Avoid contact with your eyes, ears, mouth and genitals (private parts). Wash genitals (private parts) with your normal soap.  6. Wash thoroughly, paying special attention to the area where your surgery will be performed.  7. Thoroughly rinse your body with warm water from the neck down.  8. DO NOT shower/wash with your normal soap after using and rinsing off the CHG Soap.  9. Pat yourself dry with a CLEAN TOWEL.   10. Wear CLEAN PAJAMAS   11. Place CLEAN SHEETS  on your bed the night of your first shower and DO NOT SLEEP WITH PETS.    Day of Surgery: Do not apply any deodorants/lotions. Please wear clean clothes to the hospital/surgery center.

## 2017-05-30 NOTE — Progress Notes (Signed)
PCP is [facility].  LOV  09/2015 Oncologist is Dr. Arlyce Dice 05/2017 Having an echo 9/26 in preparation for chemo.   Denies any cardiac issues.  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

## 2017-05-31 ENCOUNTER — Ambulatory Visit (HOSPITAL_COMMUNITY)
Admission: RE | Admit: 2017-05-31 | Discharge: 2017-05-31 | Disposition: A | Discharge status: Home / Self Care | Payer: BLUE CROSS/BLUE SHIELD | Source: Ambulatory Visit | Attending: Hematology | Admitting: Hematology

## 2017-05-31 DIAGNOSIS — Z853 Personal history of malignant neoplasm of breast: Secondary | ICD-10-CM | POA: Insufficient documentation

## 2017-05-31 DIAGNOSIS — Z170 Estrogen receptor positive status [ER+]: Secondary | ICD-10-CM | POA: Insufficient documentation

## 2017-05-31 DIAGNOSIS — R011 Cardiac murmur, unspecified: Secondary | ICD-10-CM | POA: Insufficient documentation

## 2017-05-31 DIAGNOSIS — C50912 Malignant neoplasm of unspecified site of left female breast: Secondary | ICD-10-CM | POA: Insufficient documentation

## 2017-05-31 NOTE — Anesthesia Preprocedure Evaluation (Addendum)
Anesthesia Evaluation  Patient identified by MRN, date of birth, ID band Patient awake    Reviewed: Allergy & Precautions, NPO status , Patient's Chart, lab work & pertinent test results  Airway Mallampati: I  TM Distance: >3 FB     Dental   Pulmonary former smoker,    breath sounds clear to auscultation       Cardiovascular negative cardio ROS   Rhythm:Regular Rate:Normal     Neuro/Psych    GI/Hepatic negative GI ROS, Neg liver ROS,   Endo/Other  negative endocrine ROS  Renal/GU negative Renal ROS     Musculoskeletal   Abdominal   Peds  Hematology   Anesthesia Other Findings Echo 9/18  Study Conclusions  - Left ventricle: The cavity size was normal. Wall thickness was   normal. Systolic function was normal. The estimated ejection   fraction was in the range of 55% to 60%. Wall motion was normal;   there were no regional wall motion abnormalities.   Reproductive/Obstetrics                            Anesthesia Physical  Anesthesia Plan  ASA: II  Anesthesia Plan: General   Post-op Pain Management:    Induction: Intravenous  PONV Risk Score and Plan: 3 and Ondansetron, Dexamethasone, Midazolam, Treatment may vary due to age or medical condition and Scopolamine patch - Pre-op  Airway Management Planned: LMA  Additional Equipment:   Intra-op Plan:   Post-operative Plan: Extubation in OR  Informed Consent: I have reviewed the patients History and Physical, chart, labs and discussed the procedure including the risks, benefits and alternatives for the proposed anesthesia with the patient or authorized representative who has indicated his/her understanding and acceptance.   Dental advisory given  Plan Discussed with: CRNA and Anesthesiologist  Anesthesia Plan Comments:         Anesthesia Quick Evaluation

## 2017-05-31 NOTE — Progress Notes (Signed)
  Echocardiogram 2D Echocardiogram has been performed.  Madison Savage M 05/31/2017, 11:55 AM

## 2017-06-01 ENCOUNTER — Ambulatory Visit (HOSPITAL_COMMUNITY): Payer: BLUE CROSS/BLUE SHIELD

## 2017-06-01 ENCOUNTER — Encounter (HOSPITAL_COMMUNITY): Payer: Self-pay | Admitting: Anesthesiology

## 2017-06-01 ENCOUNTER — Ambulatory Visit (HOSPITAL_COMMUNITY): Payer: BLUE CROSS/BLUE SHIELD | Admitting: Anesthesiology

## 2017-06-01 ENCOUNTER — Ambulatory Visit (HOSPITAL_COMMUNITY)
Admission: RE | Admit: 2017-06-01 | Discharge: 2017-06-01 | Disposition: A | Discharge status: Home / Self Care | Payer: BLUE CROSS/BLUE SHIELD | Source: Ambulatory Visit | Attending: General Surgery | Admitting: General Surgery

## 2017-06-01 ENCOUNTER — Encounter (HOSPITAL_COMMUNITY)
Admission: RE | Disposition: A | Discharge status: Home / Self Care | Payer: Self-pay | Source: Ambulatory Visit | Attending: General Surgery

## 2017-06-01 DIAGNOSIS — Z9013 Acquired absence of bilateral breasts and nipples: Secondary | ICD-10-CM | POA: Diagnosis not present

## 2017-06-01 DIAGNOSIS — Z4682 Encounter for fitting and adjustment of non-vascular catheter: Secondary | ICD-10-CM | POA: Diagnosis not present

## 2017-06-01 DIAGNOSIS — Z888 Allergy status to other drugs, medicaments and biological substances status: Secondary | ICD-10-CM | POA: Diagnosis not present

## 2017-06-01 DIAGNOSIS — C50412 Malignant neoplasm of upper-outer quadrant of left female breast: Secondary | ICD-10-CM | POA: Insufficient documentation

## 2017-06-01 DIAGNOSIS — K209 Esophagitis, unspecified: Secondary | ICD-10-CM | POA: Diagnosis not present

## 2017-06-01 DIAGNOSIS — Z452 Encounter for adjustment and management of vascular access device: Secondary | ICD-10-CM | POA: Diagnosis not present

## 2017-06-01 DIAGNOSIS — C773 Secondary and unspecified malignant neoplasm of axilla and upper limb lymph nodes: Secondary | ICD-10-CM | POA: Diagnosis not present

## 2017-06-01 DIAGNOSIS — Z79899 Other long term (current) drug therapy: Secondary | ICD-10-CM | POA: Insufficient documentation

## 2017-06-01 DIAGNOSIS — Z885 Allergy status to narcotic agent status: Secondary | ICD-10-CM | POA: Insufficient documentation

## 2017-06-01 DIAGNOSIS — Z87891 Personal history of nicotine dependence: Secondary | ICD-10-CM | POA: Diagnosis not present

## 2017-06-01 DIAGNOSIS — Z95828 Presence of other vascular implants and grafts: Secondary | ICD-10-CM

## 2017-06-01 DIAGNOSIS — Z170 Estrogen receptor positive status [ER+]: Secondary | ICD-10-CM | POA: Diagnosis not present

## 2017-06-01 DIAGNOSIS — Z880 Allergy status to penicillin: Secondary | ICD-10-CM | POA: Insufficient documentation

## 2017-06-01 DIAGNOSIS — C50912 Malignant neoplasm of unspecified site of left female breast: Secondary | ICD-10-CM | POA: Diagnosis not present

## 2017-06-01 HISTORY — PX: PORTACATH PLACEMENT: SHX2246

## 2017-06-01 IMAGING — DX DG CHEST 1V PORT
1 series · 1 of 1 positions shown · non-contrast
Comparison: Radiograph [DATE].

CLINICAL DATA: Status post Port-A-Cath placement.

EXAM:
PORTABLE CHEST 1 VIEW

[chest ap]
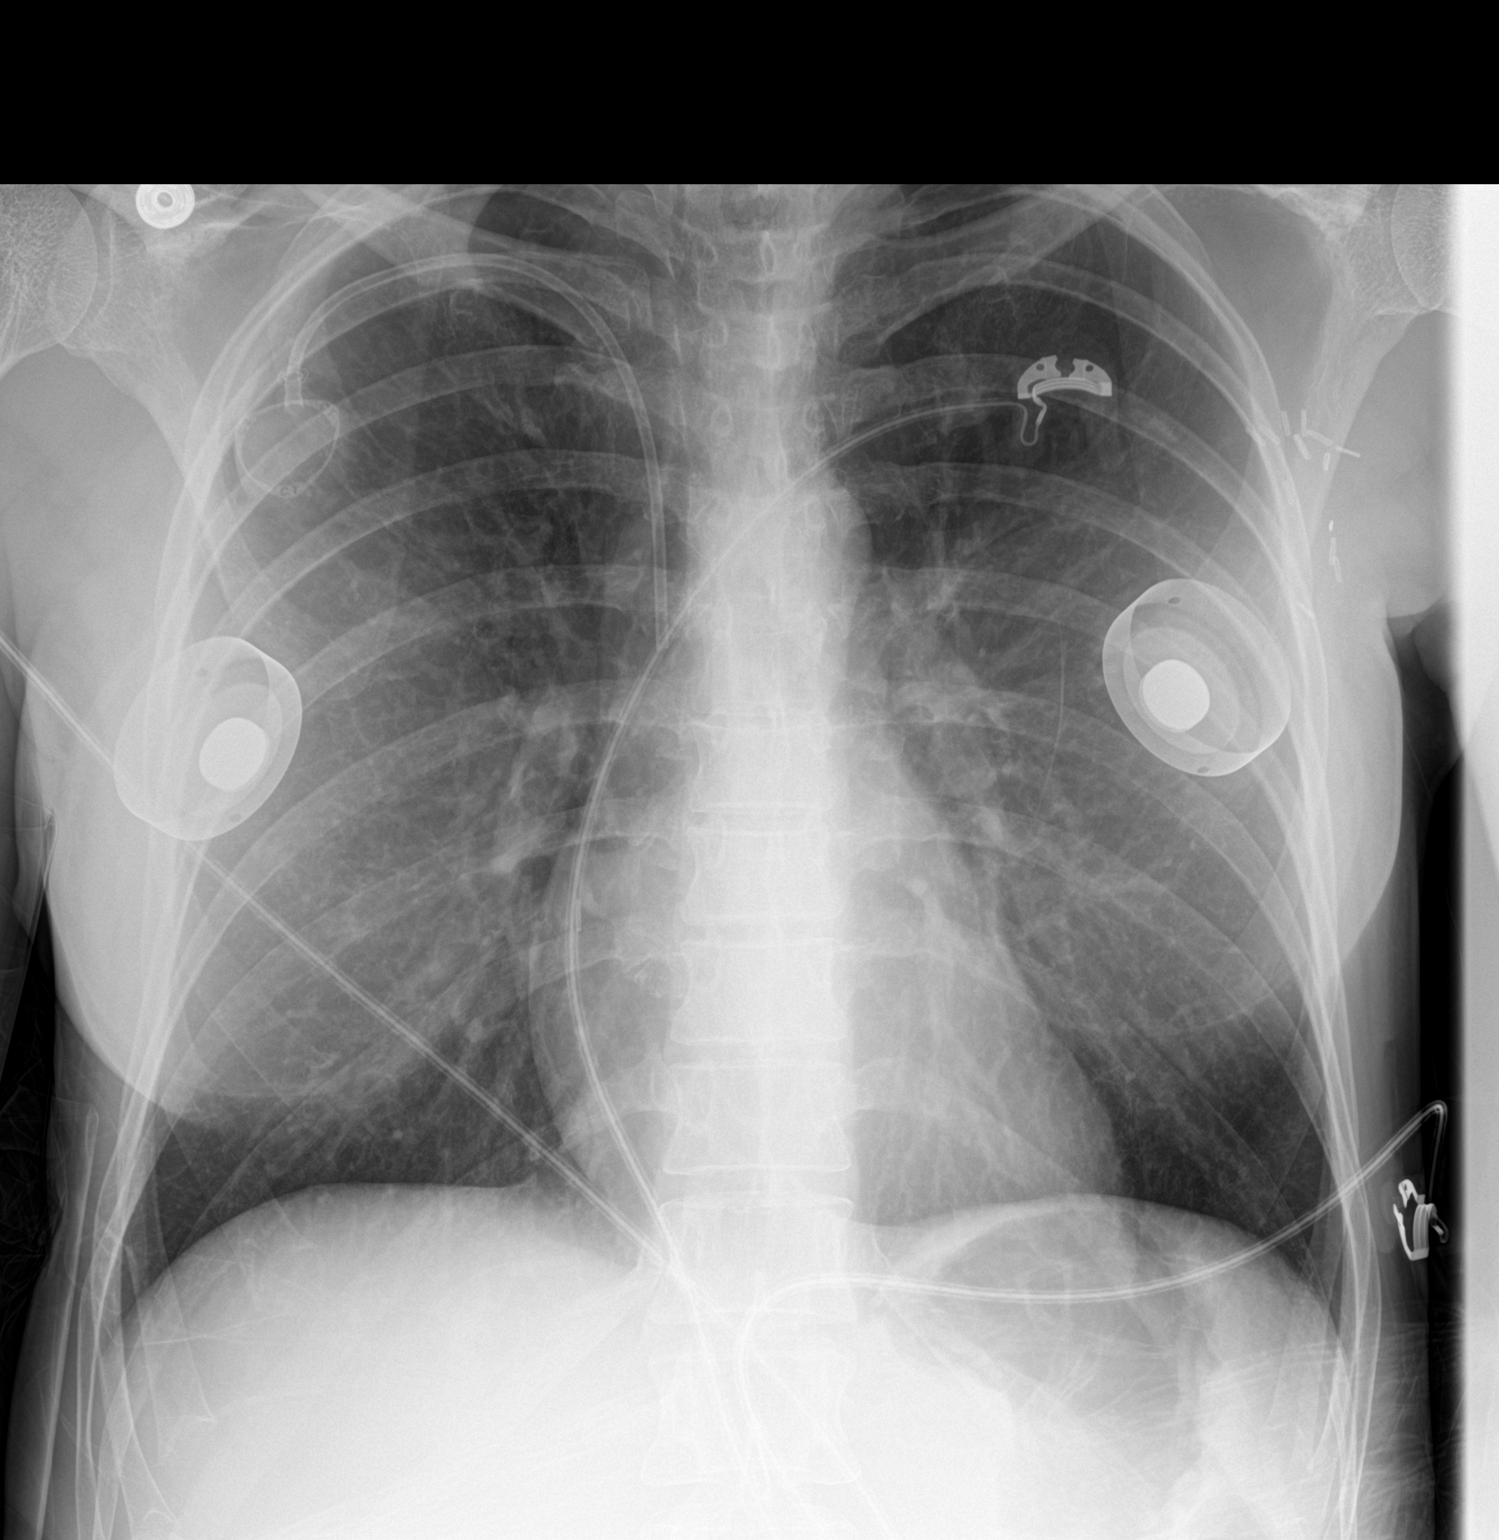

[1 of 1 positions shown; findings below may reference images not displayed]

FINDINGS: The heart size and mediastinal contours are within normal limits.
Interval placement of right subclavian Port-A-Cath with distal tip
in expected position of the SVC. No pneumothorax or pleural effusion
is noted. Both lungs are clear. The visualized skeletal structures
are unremarkable.
IMPRESSION: Interval placement of right subclavian Port-A-Cath with distal tip
in expected position of the SVC. No acute cardiopulmonary
abnormality seen.

## 2017-06-01 SURGERY — INSERTION, TUNNELED CENTRAL VENOUS DEVICE, WITH PORT
Anesthesia: General | Site: Chest | Laterality: Right

## 2017-06-01 MED ORDER — SODIUM CHLORIDE 0.9 % IV SOLN
INTRAVENOUS | Status: DC | PRN
  Administered 2017-06-01: 08:00:00 500 mL

## 2017-06-01 MED ORDER — PROPOFOL 10 MG/ML IV BOLUS
INTRAVENOUS | Status: AC
  Filled 2017-06-01: qty 20

## 2017-06-01 MED ORDER — EPHEDRINE SULFATE 50 MG/ML IJ SOLN
INTRAMUSCULAR | Status: DC | PRN
  Administered 2017-06-01 (×3): 10 mg via INTRAVENOUS

## 2017-06-01 MED ORDER — PROPOFOL 10 MG/ML IV BOLUS
INTRAVENOUS | Status: DC | PRN
  Administered 2017-06-01: 140 mg via INTRAVENOUS

## 2017-06-01 MED ORDER — HYDROCODONE-ACETAMINOPHEN 5-325 MG PO TABS: 1.0000 | ORAL_TABLET | ORAL | 0 refills | Status: DC | PRN

## 2017-06-01 MED ORDER — ONDANSETRON HCL 4 MG/2ML IJ SOLN
INTRAMUSCULAR | Status: DC | PRN
  Administered 2017-06-01: 4 mg via INTRAVENOUS

## 2017-06-01 MED ORDER — MIDAZOLAM HCL 2 MG/2ML IJ SOLN
INTRAMUSCULAR | Status: AC
  Filled 2017-06-01: qty 2

## 2017-06-01 MED ORDER — HEPARIN SOD (PORK) LOCK FLUSH 100 UNIT/ML IV SOLN
INTRAVENOUS | Status: AC
  Filled 2017-06-01: qty 5

## 2017-06-01 MED ORDER — CHLORHEXIDINE GLUCONATE CLOTH 2 % EX PADS: 6.0000 | MEDICATED_PAD | Freq: Once | CUTANEOUS | Status: DC

## 2017-06-01 MED ORDER — ONDANSETRON HCL 4 MG/2ML IJ SOLN: 4.0000 mg | Freq: Once | INTRAMUSCULAR | Status: DC | PRN

## 2017-06-01 MED ORDER — 0.9 % SODIUM CHLORIDE (POUR BTL) OPTIME
TOPICAL | Status: DC | PRN
Start: 2017-06-01 — End: 2017-06-01
  Administered 2017-06-01: 1000 mL

## 2017-06-01 MED ORDER — HEPARIN SOD (PORK) LOCK FLUSH 100 UNIT/ML IV SOLN
INTRAVENOUS | Status: DC | PRN
  Administered 2017-06-01: 100 [IU] via INTRAVENOUS
  Administered 2017-06-01: 400 [IU] via INTRAVENOUS

## 2017-06-01 MED ORDER — LACTATED RINGERS IV SOLN
INTRAVENOUS | Status: DC | PRN
  Administered 2017-06-01: 07:00:00 via INTRAVENOUS

## 2017-06-01 MED ORDER — DIAZEPAM 2 MG PO TABS: 2.0000 mg | ORAL_TABLET | Freq: Four times a day (QID) | ORAL | 0 refills | Status: DC | PRN

## 2017-06-01 MED ORDER — MEPERIDINE HCL 25 MG/ML IJ SOLN: 6.2500 mg | INTRAMUSCULAR | Status: DC | PRN

## 2017-06-01 MED ORDER — CIPROFLOXACIN IN D5W 400 MG/200ML IV SOLN
INTRAVENOUS | Status: AC
  Filled 2017-06-01: qty 200

## 2017-06-01 MED ORDER — DEXAMETHASONE SODIUM PHOSPHATE 10 MG/ML IJ SOLN
INTRAMUSCULAR | Status: AC
  Filled 2017-06-01: qty 1

## 2017-06-01 MED ORDER — FENTANYL CITRATE (PF) 250 MCG/5ML IJ SOLN
INTRAMUSCULAR | Status: AC
  Filled 2017-06-01: qty 5

## 2017-06-01 MED ORDER — BUPIVACAINE-EPINEPHRINE (PF) 0.25% -1:200000 IJ SOLN
INTRAMUSCULAR | Status: AC
  Filled 2017-06-01: qty 30

## 2017-06-01 MED ORDER — FENTANYL CITRATE (PF) 100 MCG/2ML IJ SOLN: 25.0000 ug | INTRAMUSCULAR | Status: DC | PRN

## 2017-06-01 MED ORDER — PHENYLEPHRINE 40 MCG/ML (10ML) SYRINGE FOR IV PUSH (FOR BLOOD PRESSURE SUPPORT)
PREFILLED_SYRINGE | INTRAVENOUS | Status: AC
  Filled 2017-06-01: qty 10

## 2017-06-01 MED ORDER — LIDOCAINE HCL (PF) 1 % IJ SOLN
INTRAMUSCULAR | Status: AC
  Filled 2017-06-01: qty 30

## 2017-06-01 MED ORDER — ONDANSETRON HCL 4 MG/2ML IJ SOLN
INTRAMUSCULAR | Status: AC
  Filled 2017-06-01: qty 2

## 2017-06-01 MED ORDER — CIPROFLOXACIN IN D5W 400 MG/200ML IV SOLN
400.0000 mg | INTRAVENOUS | Status: AC
  Administered 2017-06-01: 400 mg via INTRAVENOUS

## 2017-06-01 MED ORDER — EPHEDRINE 5 MG/ML INJ
INTRAVENOUS | Status: AC
  Filled 2017-06-01: qty 10

## 2017-06-01 MED ORDER — LIDOCAINE HCL 1 % IJ SOLN
INTRAMUSCULAR | Status: DC | PRN
  Administered 2017-06-01: 10 mL

## 2017-06-01 MED ORDER — FENTANYL CITRATE (PF) 100 MCG/2ML IJ SOLN
INTRAMUSCULAR | Status: DC | PRN
  Administered 2017-06-01: 25 ug via INTRAVENOUS
  Administered 2017-06-01: 50 ug via INTRAVENOUS
  Administered 2017-06-01: 75 ug via INTRAVENOUS

## 2017-06-01 MED ORDER — DEXAMETHASONE SODIUM PHOSPHATE 10 MG/ML IJ SOLN
INTRAMUSCULAR | Status: DC | PRN
  Administered 2017-06-01: 10 mg via INTRAVENOUS

## 2017-06-01 MED ORDER — LIDOCAINE 2% (20 MG/ML) 5 ML SYRINGE
INTRAMUSCULAR | Status: AC
  Filled 2017-06-01: qty 5

## 2017-06-01 MED ORDER — PHENYLEPHRINE HCL 10 MG/ML IJ SOLN
INTRAMUSCULAR | Status: DC | PRN
  Administered 2017-06-01: 40 ug via INTRAVENOUS
  Administered 2017-06-01: 80 ug via INTRAVENOUS
  Administered 2017-06-01: 40 ug via INTRAVENOUS

## 2017-06-01 MED ORDER — MIDAZOLAM HCL 5 MG/5ML IJ SOLN
INTRAMUSCULAR | Status: DC | PRN
  Administered 2017-06-01: 2 mg via INTRAVENOUS

## 2017-06-01 MED ORDER — LIDOCAINE 2% (20 MG/ML) 5 ML SYRINGE
INTRAMUSCULAR | Status: DC | PRN
  Administered 2017-06-01: 80 mg via INTRAVENOUS

## 2017-06-01 SURGICAL SUPPLY — 46 items
BAG DECANTER FOR FLEXI CONT (MISCELLANEOUS) ×2 IMPLANT
BLADE HEX COATED 2.75 (ELECTRODE) ×2 IMPLANT
BLADE SURG 11 STRL SS (BLADE) ×2 IMPLANT
BLADE SURG 15 STRL LF DISP TIS (BLADE) ×1 IMPLANT
BLADE SURG 15 STRL SS (BLADE) ×1
CANISTER SUCT 3000ML PPV (MISCELLANEOUS) IMPLANT
CHLORAPREP W/TINT 10.5 ML (MISCELLANEOUS) ×2 IMPLANT
COVER SURGICAL LIGHT HANDLE (MISCELLANEOUS) ×2 IMPLANT
COVER TRANSDUCER ULTRASND GEL (DRAPE) IMPLANT
CRADLE DONUT ADULT HEAD (MISCELLANEOUS) ×2 IMPLANT
DECANTER SPIKE VIAL GLASS SM (MISCELLANEOUS) ×4 IMPLANT
DERMABOND ADVANCED (GAUZE/BANDAGES/DRESSINGS) ×1
DERMABOND ADVANCED .7 DNX12 (GAUZE/BANDAGES/DRESSINGS) ×1 IMPLANT
DRAPE C-ARM 42X72 X-RAY (DRAPES) ×2 IMPLANT
DRAPE CHEST BREAST 15X10 FENES (DRAPES) ×2 IMPLANT
DRAPE UTILITY XL STRL (DRAPES) ×4 IMPLANT
DRAPE WARM FLUID 44X44 (DRAPE) IMPLANT
ELECT REM PT RETURN 9FT ADLT (ELECTROSURGICAL) ×2
ELECTRODE REM PT RTRN 9FT ADLT (ELECTROSURGICAL) ×1 IMPLANT
GAUZE SPONGE 4X4 16PLY XRAY LF (GAUZE/BANDAGES/DRESSINGS) ×2 IMPLANT
GEL ULTRASOUND 20GR AQUASONIC (MISCELLANEOUS) IMPLANT
GLOVE BIO SURGEON STRL SZ 6 (GLOVE) ×2 IMPLANT
GLOVE BIOGEL PI IND STRL 6.5 (GLOVE) ×1 IMPLANT
GLOVE BIOGEL PI INDICATOR 6.5 (GLOVE) ×1
GOWN STRL REUS W/ TWL LRG LVL3 (GOWN DISPOSABLE) ×1 IMPLANT
GOWN STRL REUS W/TWL 2XL LVL3 (GOWN DISPOSABLE) ×2 IMPLANT
GOWN STRL REUS W/TWL LRG LVL3 (GOWN DISPOSABLE) ×1
KIT BASIN OR (CUSTOM PROCEDURE TRAY) ×2 IMPLANT
KIT PORT POWER 8FR ISP CVUE (Miscellaneous) ×2 IMPLANT
KIT ROOM TURNOVER OR (KITS) ×2 IMPLANT
NEEDLE HYPO 25GX1X1/2 BEV (NEEDLE) ×2 IMPLANT
NS IRRIG 1000ML POUR BTL (IV SOLUTION) ×2 IMPLANT
PACK SURGICAL SETUP 50X90 (CUSTOM PROCEDURE TRAY) ×2 IMPLANT
PAD ARMBOARD 7.5X6 YLW CONV (MISCELLANEOUS) ×2 IMPLANT
PENCIL BUTTON HOLSTER BLD 10FT (ELECTRODE) ×2 IMPLANT
SUT MON AB 4-0 PC3 18 (SUTURE) ×2 IMPLANT
SUT PROLENE 2 0 SH DA (SUTURE) ×4 IMPLANT
SUT VIC AB 3-0 SH 27 (SUTURE) ×1
SUT VIC AB 3-0 SH 27X BRD (SUTURE) ×1 IMPLANT
SYR 20ML ECCENTRIC (SYRINGE) ×4 IMPLANT
SYR 5ML LUER SLIP (SYRINGE) ×2 IMPLANT
SYR CONTROL 10ML LL (SYRINGE) ×2 IMPLANT
TOWEL OR 17X24 6PK STRL BLUE (TOWEL DISPOSABLE) ×2 IMPLANT
TOWEL OR 17X26 10 PK STRL BLUE (TOWEL DISPOSABLE) ×2 IMPLANT
TUBE CONNECTING 12X1/4 (SUCTIONS) IMPLANT
YANKAUER SUCT BULB TIP NO VENT (SUCTIONS) IMPLANT

## 2017-06-01 NOTE — Anesthesia Procedure Notes (Signed)
Procedure Name: LMA Insertion Date/Time: 06/01/2017 7:46 AM Performed by: Rush Farmer E Pre-anesthesia Checklist: Patient identified, Emergency Drugs available, Suction available and Patient being monitored Patient Re-evaluated:Patient Re-evaluated prior to induction Oxygen Delivery Method: Circle system utilized Preoxygenation: Pre-oxygenation with 100% oxygen Induction Type: IV induction LMA: LMA inserted LMA Size: 4.0 Number of attempts: 1 Placement Confirmation: positive ETCO2 and breath sounds checked- equal and bilateral Tube secured with: Tape Dental Injury: Teeth and Oropharynx as per pre-operative assessment

## 2017-06-01 NOTE — Transfer of Care (Signed)
Immediate Anesthesia Transfer of Care Note  Patient: Madison Savage  Procedure(s) Performed: Procedure(s): INSERTION PORT-A-CATH RIGHT SUBCLAVIAN (Right)  Patient Location: PACU  Anesthesia Type:General  Level of Consciousness: awake, alert  and oriented  Airway & Oxygen Therapy: Patient Spontanous Breathing and Patient connected to nasal cannula oxygen  Post-op Assessment: Report given to RN, Post -op Vital signs reviewed and stable and Patient moving all extremities X 4  Post vital signs: Reviewed and stable  Last Vitals:  Vitals:   06/01/17 0625 06/01/17 0900  BP: (!) 90/54 (P) 138/74  Pulse: 63   Resp: 18   Temp: 36.6 C (P) 36.4 C  SpO2: 99%     Last Pain:  Vitals:   06/01/17 0625  TempSrc: Oral  PainSc:          Complications: No apparent anesthesia complications

## 2017-06-01 NOTE — Interval H&P Note (Signed)
History and Physical Interval Note:  06/01/2017 7:28 AM  Madison Savage  has presented today for surgery, with the diagnosis of LEFT BREAST CANCER  The various methods of treatment have been discussed with the patient and family. After consideration of risks, benefits and other options for treatment, the patient has consented to  Procedure(s): INSERTION PORT-A-CATH (N/A) as a surgical intervention .  The patient's history has been reviewed, patient examined, no change in status, stable for surgery.  I have reviewed the patient's chart and labs.  Questions were answered to the patient's satisfaction.     Jeffren Dombek

## 2017-06-01 NOTE — Op Note (Signed)
PREOPERATIVE DIAGNOSIS:  Left breast cancer     POSTOPERATIVE DIAGNOSIS:  Same     PROCEDURE: Right subclavian port placement, Bard ClearVue  Power Port, MRI safe, 8-French.      SURGEON:  [name], MD      ANESTHESIA:  General   FINDINGS:  Good venous return, easy flush, and tip of the catheter and   SVC 15.5 cm.      SPECIMEN:  None.      ESTIMATED BLOOD LOSS:  Minimal.      COMPLICATIONS:  None known.      PROCEDURE:  Pt was identified in the holding area and taken to   the operating room, where patient was placed supine on the operating room   table.  General anesthesia was induced.  Patient's arms were tucked and the upper   chest and neck were prepped and draped in sterile fashion.  Time-out was   performed according to the surgical safety check list.  When all was   correct, we continued.   Local anesthetic was administered over this   area at the angle of the clavicle.  The vein was accessed with 1 pass of the needle. There was good venous return and the wire passed easily with no ectopy.   Fluoroscopy was used to confirm that the wire was in the vena cava.      The patient was placed back level and the area for the pocket was anethetized   with local anesthetic.  A 3-cm transverse incision was made with a #15   blade.  Cautery was used to divide the subcutaneous tissues down to the   pectoralis muscle.  An [employer] retractor was used to elevate the skin   while a pocket was created on top of the pectoralis fascia.  The port   was placed into the pocket to confirm that it was of adequate size.  The   catheter was preattached to the port.  The port was then secured to the   pectoralis fascia with four 2-0 Prolene sutures.  These were clamped and   not tied down yet.    The catheter was tunneled through to the wire exit   site.  The catheter was placed along the wire to determine what length it should be to be in the SVC.  The catheter was cut at 15.5 cm.  The  tunneler sheath and dilator were passed over the wire and the dilator and wire were removed.  The catheter was advanced through the tunneler sheath and the tunneler sheath was pulled away.  Care was taken to keep the catheter in the tunneler sheath as this occurred. This was advanced and the tunneler sheath was removed.  There was good venous   return and easy flush of the catheter.  The Prolene sutures were tied   down to the pectoral fascia.  The skin was reapproximated using 3-0   Vicryl interrupted deep dermal sutures.    Fluoroscopy was used to re-confirm good position of the catheter.  The skin   was then closed using 4-0 Monocryl in a subcuticular fashion.  The port was flushed with concentrated heparin flush as well.  The wounds were then cleaned, dried, and dressed with Dermabond.  The patient was awakened from anesthesia and taken to the PACU in stable condition.  Needle, sponge, and instrument counts were correct.               [name], MD

## 2017-06-01 NOTE — Discharge Instructions (Addendum)
[facility] °Office Phone Number [phone] ° ° POST OP INSTRUCTIONS ° °Always review your discharge instruction sheet given to you by the facility where your surgery was performed. ° °IF YOU HAVE DISABILITY OR FAMILY LEAVE FORMS, YOU MUST BRING THEM TO THE OFFICE FOR PROCESSING.  DO NOT GIVE THEM TO YOUR DOCTOR. ° °1. A prescription for pain medication may be given to you upon discharge.  Take your pain medication as prescribed, if needed.  If narcotic pain medicine is not needed, then you may take acetaminophen (Tylenol) or ibuprofen (Advil) as needed. °2. Take your usually prescribed medications unless otherwise directed °3. If you need a refill on your pain medication, please contact your pharmacy.  They will contact our office to request authorization.  Prescriptions will not be filled after 5pm or on week-ends. °4. You should eat very light the first 24 hours after surgery, such as soup, crackers, pudding, etc.  Resume your normal diet the day after surgery °5. It is common to experience some constipation if taking pain medication after surgery.  Increasing fluid intake and taking a stool softener will usually help or prevent this problem from occurring.  A mild laxative (Milk of Magnesia or Miralax) should be taken according to package directions if there are no bowel movements after 48 hours. °6. You may shower in 48 hours.  The surgical glue will flake off in 2-3 weeks.   °7. ACTIVITIES:  No strenuous activity or heavy lifting for 1 week.   °a. You may drive when you no longer are taking prescription pain medication, you can comfortably wear a seatbelt, and you can safely maneuver your car and apply brakes. °b. RETURN TO WORK:  __________to be determined._______________ °You should see your doctor in the office for a follow-up appointment approximately three-four weeks after your surgery.   ° °WHEN TO CALL YOUR DOCTOR: °1. Fever over 101.0 °2. Nausea and/or vomiting. °3. Extreme swelling  or bruising. °4. Continued bleeding from incision. °5. Increased pain, redness, or drainage from the incision. ° °The clinic staff is available to answer your questions during regular business hours.  Please don’t hesitate to call and ask to speak to one of the nurses for clinical concerns.  If you have a medical emergency, go to the nearest emergency room or call 911.  A surgeon from [facility] is always on call at the hospital. ° °For further questions, please visit [email or web address]  ° °

## 2017-06-01 NOTE — Anesthesia Postprocedure Evaluation (Signed)
Anesthesia Post Note  Patient: Madison Savage  Procedure(s) Performed: Procedure(s) (LRB): INSERTION PORT-A-CATH RIGHT SUBCLAVIAN (Right)     Patient location during evaluation: PACU Anesthesia Type: General Level of consciousness: awake and alert Pain management: pain level controlled Vital Signs Assessment: post-procedure vital signs reviewed and stable Respiratory status: spontaneous breathing, nonlabored ventilation, respiratory function stable and patient connected to nasal cannula oxygen Cardiovascular status: blood pressure returned to baseline and stable Postop Assessment: no apparent nausea or vomiting Anesthetic complications: no    Last Vitals:  Vitals:   06/01/17 0930 06/01/17 0945  BP: (!) 109/56 (!) 105/56  Pulse: 80 71  Resp: 14 13  Temp:  36.6 C  SpO2: 98% 98%    Last Pain:  Vitals:   06/01/17 0945  TempSrc:   PainSc: 0-No pain                 [name]

## 2017-06-02 ENCOUNTER — Other Ambulatory Visit: Payer: BLUE CROSS/BLUE SHIELD

## 2017-06-02 ENCOUNTER — Encounter (HOSPITAL_COMMUNITY): Payer: Self-pay | Admitting: General Surgery

## 2017-06-02 ENCOUNTER — Encounter: Payer: Self-pay | Admitting: *Deleted

## 2017-06-05 ENCOUNTER — Telehealth: Payer: Self-pay | Admitting: Hematology

## 2017-06-05 NOTE — Telephone Encounter (Signed)
Spoke with patient regarding the changes in her appointments per 9/28 sch msg.

## 2017-06-06 ENCOUNTER — Encounter: Payer: Self-pay | Admitting: *Deleted

## 2017-06-07 ENCOUNTER — Ambulatory Visit: Payer: BLUE CROSS/BLUE SHIELD | Attending: General Surgery | Admitting: Physical Therapy

## 2017-06-07 ENCOUNTER — Encounter: Payer: Self-pay | Admitting: Hematology

## 2017-06-07 DIAGNOSIS — Z483 Aftercare following surgery for neoplasm: Secondary | ICD-10-CM | POA: Diagnosis not present

## 2017-06-07 DIAGNOSIS — M25612 Stiffness of left shoulder, not elsewhere classified: Secondary | ICD-10-CM

## 2017-06-07 DIAGNOSIS — M25511 Pain in right shoulder: Secondary | ICD-10-CM | POA: Diagnosis not present

## 2017-06-07 DIAGNOSIS — R293 Abnormal posture: Secondary | ICD-10-CM | POA: Diagnosis not present

## 2017-06-07 DIAGNOSIS — M25512 Pain in left shoulder: Secondary | ICD-10-CM

## 2017-06-07 DIAGNOSIS — M25611 Stiffness of right shoulder, not elsewhere classified: Secondary | ICD-10-CM | POA: Diagnosis not present

## 2017-06-07 NOTE — Therapy (Signed)
[place], [place], [address] Phone: [phone]   Fax:  [phone]  Physical Therapy Treatment  Patient Details  Name: Madison Savage MRN: [id number] Date of Birth: 07-10-1971 Referring Provider: Dr. Burr Medico   Encounter Date: 06/07/2017      PT End of Session - 06/07/17 1224    Visit Number 3   Date for PT Re-Evaluation 07/25/17   PT Start Time 0930   PT Stop Time 1015   PT Time Calculation (min) 45 min   Activity Tolerance Patient tolerated treatment well   Behavior During Therapy [facility] for tasks assessed/performed      Past Medical History:  Diagnosis Date  . Anemia   . Anxiety   . Cancer ([place]) 03/2017   left breast cancer  . Chronic ITP (idiopathic thrombocytopenia) (HCC)   . Family history of breast cancer   . Family history of ovarian cancer   . Headache    last migraine...9/24  . Heart murmur    at birth.  none now  . MTHFR mutation [facility])     Past Surgical History:  Procedure Laterality Date  . AXILLARY LYMPH NODE DISSECTION Left 04/27/2017   Procedure: AXILLARY LYMPH NODE DISSECTION;  Surgeon: [name], MD;  Location: [place];  Service: General;  Laterality: Left;  . BILATERAL TOTAL MASTECTOMY WITH AXILLARY LYMPH NODE DISSECTION Bilateral 04/27/2017   Procedure: SKIN SPARING MASTECTOMY;  Surgeon: [name], MD;  Location: [place];  Service: General;  Laterality: Bilateral;  . BREAST RECONSTRUCTION WITH PLACEMENT OF TISSUE EXPANDER AND FLEX HD (ACELLULAR HYDRATED DERMIS) Bilateral 04/27/2017   Procedure: BILATERAL BREAST RECONSTRUCTION WITH PLACEMENT OF TISSUE EXPANDER AND FLEX HD (ACELLULAR HYDRATED DERMIS);  Surgeon: Wallace Going, DO;  Location: [place];  Service: Plastics;  Laterality: Bilateral;  . BREAST SURGERY    . MASTECTOMY    . PORTACATH PLACEMENT Right 06/01/2017   Procedure: INSERTION PORT-A-CATH RIGHT  SUBCLAVIAN;  Surgeon: [name], MD;  Location: [place];  Service: General;  Laterality: Right;  . WISDOM TOOTH EXTRACTION      There were no vitals filed for this visit.      Subjective Assessment - 06/07/17 0931    Subjective Pt had her port placed last week and will be starting chemotherapy at the end of this week.  She had a fill yesterday    Currently in Pain? Yes   Pain Score --  did not rate, but pt is having pain in front of chest and back                          [facility] Adult PT Treatment/Exercise - 06/07/17 0001      Self-Care   Self-Care Other Self-Care Comments   Other Self-Care Comments  provided small tg soft to help support lymphatics fto help with cording      Shoulder Exercises: Supine   Other Supine Exercises Meeks decompression      Shoulder Exercises: Standing   Retraction AAROM;Right;Left  using mirrors for visual cues for more scapular movement   Retraction Limitations pt appears to have decrease scapular movement. used visual and tactile cues to pull shoulder blades together.  Pt appears to have decreased thoracic mobility also      Manual Therapy   Manual Therapy Manual Lymphatic Drainage (MLD)   Manual therapy comments brief MLD to left arm to help with cording  Joint Mobilization gentle stretch to pec minor with corocoid mobilization    Passive ROM PROM to left arm in all planes of movment within pain limit and to right UE for external rotation                 PT Education - 06/07/17 1224    Education provided Yes   Education Details Meeks decompression and scapular retraction    Person(s) Educated Patient   Methods Explanation;Demonstration;Handout   Comprehension Verbalized understanding;Returned demonstration           Short Term Clinic Goals - 05/25/17 1735      CC Short Term Goal  #1   Title pt will be knowledgable of lymphedema risk reduction practices    Time 4   Period Weeks   Status New     CC  Short Term Goal  #2   Title Pt will be independent in postural and shoulder stretching exercises   Time 4   Period Weeks   Status New           Breast Clinic Goals - 03/29/17 1232      Patient will be able to verbalize understanding of pertinent lymphedema risk reduction practices relevant to her diagnosis specifically related to skin care.   Time 1   Period Days   Status Achieved     Patient will be able to return demonstrate and/or verbalize understanding of the post-op home exercise program related to regaining shoulder range of motion.   Time 1   Period Days   Status Achieved     Patient will be able to verbalize understanding of the importance of attending the postoperative After Breast Cancer Class for further lymphedema risk reduction education and therapeutic exercise.   Time 1   Period Days   Status Achieved          Long Term Clinic Goals - 05/25/17 1736      CC Long Term Goal  #1   Title Pt will have 140 degrees of left shoulder abduction so that she can received radiation treatment    Baseline 05/25/2017: 60 degrees    Time 8   Period Weeks   Status New     CC Long Term Goal  #2   Title Pt will ahve 150 degrees of right shoulder abduction so that she can perform household activities without difficulty    Baseline 05/25/2017: 95 degrees    Time 8   Period Weeks   Status New     CC Long Term Goal  #3   Title Pt will be independent in strength ABC program with knowledge of how to safely increase her intensity of exercise   Time 8   Period Weeks   Status New     CC Long Term Goal  #4   Title Pt will decrease Quick DASH score to < 20 indicating a function improvment of arms    Baseline 52.27 on 05/25/2017             Plan - 06/07/17 1224    Clinical Impression Statement Pt is having discomfort today from port insertion and recent expander fill. She is limited in what she can do with right arm today.  Upgraded home program to include Meeks  decompression exercise and scapular retraction as she appears to be limited in this.    Clinical Impairments Affecting Rehab Potential 21 nodes removed in left axilla    PT Frequency 2x / week   PT  Duration 8 weeks   PT Next Visit Plan Focus on scapular mobility and PROM to both shoulders. , manual lymph drainage /if needed for cording, assess effect of tg soft. add pulleys and wall stretches, ball on wall stretches. thoracic mobilty    Consulted and Agree with Plan of Care Patient      Patient will benefit from skilled therapeutic intervention in order to improve the following deficits and impairments:  Decreased range of motion, Impaired UE functional use, Pain, Decreased knowledge of precautions, Postural dysfunction, Decreased knowledge of use of DME, Decreased strength, Increased fascial restricitons, Impaired perceived functional ability  Visit Diagnosis: Aftercare following surgery for neoplasm  Stiffness of left shoulder, not elsewhere classified  Acute pain of left shoulder  Abnormal posture  Stiffness of right shoulder, not elsewhere classified  Pain of right shoulder joint on movement     Problem List Patient Active Problem List   Diagnosis Date Noted  . Breast cancer ([place]) 04/27/2017  . Genetic testing 04/17/2017  . BRCA2 gene mutation positive 04/17/2017  . Family history of breast cancer   . Family history of ovarian cancer   . Breast cancer metastasized to axillary lymph node, left ([place]) 03/29/2017  . Cough 03/10/2014  . Esophagitis 03/10/2014  . Chronic ITP (idiopathic thrombocytopenia) (HCC) 03/10/2014  . ALLERGIC REACTION, ACUTE 07/22/2008   Donato Heinz. Owens Shark PT  [place]wood Levo 06/07/2017, 12:30 PM  [place], [place], [address] Phone: [phone]   Fax:  [phone]  Name: Madison Savage MRN: [id number] Date of Birth: 1970/10/14

## 2017-06-07 NOTE — Patient Instructions (Signed)

## 2017-06-07 NOTE — Progress Notes (Signed)
Called pt to introduce myself as her Arboriculturist and to discuss copay assistance w/ [employer] and inform her of the [employer].  Left msg requesting she return my call at her earliest convenience.

## 2017-06-09 ENCOUNTER — Encounter: Payer: Self-pay | Admitting: Nurse Practitioner

## 2017-06-09 ENCOUNTER — Telehealth: Payer: Self-pay | Admitting: Hematology

## 2017-06-09 ENCOUNTER — Encounter: Payer: Self-pay | Admitting: *Deleted

## 2017-06-09 ENCOUNTER — Encounter: Payer: BLUE CROSS/BLUE SHIELD | Admitting: Physical Therapy

## 2017-06-09 ENCOUNTER — Ambulatory Visit: Payer: BLUE CROSS/BLUE SHIELD

## 2017-06-09 ENCOUNTER — Other Ambulatory Visit: Payer: Self-pay | Admitting: *Deleted

## 2017-06-09 ENCOUNTER — Ambulatory Visit (HOSPITAL_BASED_OUTPATIENT_CLINIC_OR_DEPARTMENT_OTHER): Payer: BLUE CROSS/BLUE SHIELD

## 2017-06-09 ENCOUNTER — Other Ambulatory Visit (HOSPITAL_BASED_OUTPATIENT_CLINIC_OR_DEPARTMENT_OTHER): Payer: BLUE CROSS/BLUE SHIELD

## 2017-06-09 ENCOUNTER — Ambulatory Visit (HOSPITAL_BASED_OUTPATIENT_CLINIC_OR_DEPARTMENT_OTHER): Payer: BLUE CROSS/BLUE SHIELD | Admitting: Nurse Practitioner

## 2017-06-09 VITALS — BP 98/70 | HR 61 | Temp 97.8°F | Resp 20 | Ht 67.5 in | Wt 119.3 lb

## 2017-06-09 DIAGNOSIS — F411 Generalized anxiety disorder: Secondary | ICD-10-CM

## 2017-06-09 DIAGNOSIS — Z5111 Encounter for antineoplastic chemotherapy: Secondary | ICD-10-CM | POA: Diagnosis not present

## 2017-06-09 DIAGNOSIS — Z5189 Encounter for other specified aftercare: Secondary | ICD-10-CM

## 2017-06-09 DIAGNOSIS — Z170 Estrogen receptor positive status [ER+]: Secondary | ICD-10-CM | POA: Diagnosis not present

## 2017-06-09 DIAGNOSIS — C773 Secondary and unspecified malignant neoplasm of axilla and upper limb lymph nodes: Secondary | ICD-10-CM | POA: Diagnosis not present

## 2017-06-09 DIAGNOSIS — D693 Immune thrombocytopenic purpura: Secondary | ICD-10-CM | POA: Diagnosis not present

## 2017-06-09 DIAGNOSIS — C50912 Malignant neoplasm of unspecified site of left female breast: Secondary | ICD-10-CM

## 2017-06-09 DIAGNOSIS — Z1509 Genetic susceptibility to other malignant neoplasm: Secondary | ICD-10-CM

## 2017-06-09 DIAGNOSIS — Z1501 Genetic susceptibility to malignant neoplasm of breast: Secondary | ICD-10-CM

## 2017-06-09 LAB — CBC WITH DIFFERENTIAL/PLATELET
BASO%: 0.7 % (ref 0.0–2.0)
Basophils Absolute: 0 10*3/uL (ref 0.0–0.1)
EOS%: 3.1 % (ref 0.0–7.0)
Eosinophils Absolute: 0.2 10*3/uL (ref 0.0–0.5)
HCT: 40.4 % (ref 34.8–46.6)
HGB: 13.4 g/dL (ref 11.6–15.9)
LYMPH%: 22.9 % (ref 14.0–49.7)
MCH: 30.6 pg (ref 25.1–34.0)
MCHC: 33.1 g/dL (ref 31.5–36.0)
MCV: 92.2 fL (ref 79.5–101.0)
MONO#: 0.5 10*3/uL (ref 0.1–0.9)
MONO%: 8.3 % (ref 0.0–14.0)
NEUT#: 4.2 10*3/uL (ref 1.5–6.5)
NEUT%: 65 % (ref 38.4–76.8)
Platelets: 180 10*3/uL (ref 145–400)
RBC: 4.38 10*6/uL (ref 3.70–5.45)
RDW: 13.2 % (ref 11.2–14.5)
WBC: 6.5 10*3/uL (ref 3.9–10.3)
lymph#: 1.5 10*3/uL (ref 0.9–3.3)

## 2017-06-09 LAB — COMPREHENSIVE METABOLIC PANEL
ALT: 9 U/L (ref 0–55)
AST: 14 U/L (ref 5–34)
Albumin: 3.9 g/dL (ref 3.5–5.0)
Alkaline Phosphatase: 70 U/L (ref 40–150)
Anion Gap: 8 mEq/L (ref 3–11)
BUN: 14.3 mg/dL (ref 7.0–26.0)
CO2: 26 mEq/L (ref 22–29)
Calcium: 9.5 mg/dL (ref 8.4–10.4)
Chloride: 105 mEq/L (ref 98–109)
Creatinine: 0.8 mg/dL (ref 0.6–1.1)
EGFR: >90 mL/min/{1.73_m2} (ref >90)
Glucose: 89 mg/dl (ref 70–140)
Potassium: 4.2 mEq/L (ref 3.5–5.1)
Sodium: 139 mEq/L (ref 136–145)
Total Bilirubin: 0.38 mg/dL (ref 0.20–1.20)
Total Protein: 6.6 g/dL (ref 6.4–8.3)

## 2017-06-09 MED ORDER — LORAZEPAM 2 MG/ML IJ SOLN
INTRAMUSCULAR | Status: AC
  Filled 2017-06-09: qty 1

## 2017-06-09 MED ORDER — PALONOSETRON HCL INJECTION 0.25 MG/5ML
INTRAVENOUS | Status: AC
Start: 2017-06-09 — End: ?
  Filled 2017-06-09: qty 5

## 2017-06-09 MED ORDER — SODIUM CHLORIDE 0.9% FLUSH
10.0000 mL | INTRAVENOUS | Status: DC | PRN
  Administered 2017-06-09: 10 mL
  Filled 2017-06-09: qty 10

## 2017-06-09 MED ORDER — HEPARIN SOD (PORK) LOCK FLUSH 100 UNIT/ML IV SOLN
500.0000 [IU] | Freq: Once | INTRAVENOUS | Status: AC | PRN
  Administered 2017-06-09: 500 [IU]
  Filled 2017-06-09: qty 5

## 2017-06-09 MED ORDER — SODIUM CHLORIDE 0.9 % IV SOLN
Freq: Once | INTRAVENOUS | Status: AC
  Administered 2017-06-09: 11:00:00 via INTRAVENOUS
  Filled 2017-06-09: qty 5

## 2017-06-09 MED ORDER — SODIUM CHLORIDE 0.9 % IV SOLN
Freq: Once | INTRAVENOUS | Status: AC
  Administered 2017-06-09: 11:00:00 via INTRAVENOUS

## 2017-06-09 MED ORDER — SODIUM CHLORIDE 0.9 % IV SOLN
600.0000 mg/m2 | Freq: Once | INTRAVENOUS | Status: AC
  Administered 2017-06-09: 940 mg via INTRAVENOUS
  Filled 2017-06-09: qty 47

## 2017-06-09 MED ORDER — LORAZEPAM 2 MG/ML IJ SOLN
0.5000 mg | Freq: Once | INTRAMUSCULAR | Status: AC
  Administered 2017-06-09: 0.5 mg via INTRAVENOUS

## 2017-06-09 MED ORDER — DOXORUBICIN HCL CHEMO IV INJECTION 2 MG/ML
60.0000 mg/m2 | Freq: Once | INTRAVENOUS | Status: AC
  Administered 2017-06-09: 94 mg via INTRAVENOUS
  Filled 2017-06-09: qty 47

## 2017-06-09 MED ORDER — PALONOSETRON HCL INJECTION 0.25 MG/5ML
0.2500 mg | Freq: Once | INTRAVENOUS | Status: AC
  Administered 2017-06-09: 0.25 mg via INTRAVENOUS

## 2017-06-09 MED ORDER — LORAZEPAM 2 MG/ML IJ SOLN: 0.5000 mg | Freq: Once | INTRAMUSCULAR | Status: DC

## 2017-06-09 MED ORDER — PEGFILGRASTIM 6 MG/0.6ML ~~LOC~~ PSKT
6.0000 mg | PREFILLED_SYRINGE | Freq: Once | SUBCUTANEOUS | Status: AC
  Administered 2017-06-09: 6 mg via SUBCUTANEOUS
  Filled 2017-06-09: qty 0.6

## 2017-06-09 NOTE — Progress Notes (Signed)
[place]  [phone] [phone]  Clinic Follow up Note   Patient Care Team: Dorena Cookey, MD as PCP - [name], MD as Consulting Physician (General Surgery) [name], MD as Consulting Physician (Hematology) Kyung Rudd, MD as Consulting Physician (Radiation Oncology) Brien Few, MD as Consulting Physician (Obstetrics and Gynecology) 06/09/2017  SUMMARY OF ONCOLOGIC HISTORY: Oncology History   Cancer Staging Breast cancer metastasized to axillary lymph node, left [facility]) Staging form: Breast, AJCC 8th Edition - Clinical stage from 03/22/2017: Stage Unknown (cTX, cN1, cM0, GX, ER: Positive, PR: Positive, HER2: Negative) - Signed by [name], MD on 03/29/2017 \     Breast cancer metastasized to axillary lymph node, left ([place])   03/21/2017 Mammogram    Diagnostic mammogram bilateral 03/21/17 IMPRESSION: INCOMPLETE - ADDITIONAL IMAGING EVALUATION NEEDED 1. The 0.6 cm oval mass in the right breast at 11 o'clock middle depth is indeterminate. An ultrasound is recommended.  2. The 0.5cm round focal asymmetry in the left breast central to the nipple middle depth is indeterminate. An ultrasound is recommended.  3. Ultrasound of the palpable abnormalities in the axilla and far left lateral breast is recommended.  4. Multiple clusters of pleomorphic calcifications in the left upper outer breast anterior depth spanning a 2.5 cm area are highly suggestive of malignancy. A stereotactic biopsy is recommended.        03/21/2017 Imaging    US Breast bilateral 03/21/17 IMPRESSION: HIGHLY SUGGESTIVE OF MALIGNANCY 1. Six distinct abnormal left axillary nodes, 2 of which correspond to the palpable lumps felt by the patient. Findings concerning for metastatic adenopathy. Ultrasound guided biopsy of one of these nodes is recommended.  2. Small 5 mm palpable, oval mass in the left breast 3:00 areolar margin is at an intermediate suspicion.  Ultrasound guided biopsy is recommended.  3. Isoechoic 8 mm mass in the right breast 10:00 5 cm from the nipple is also an intermediate suspicion for malignancy, and ultrasound guided biopsy is recommended.  4. Stereotactic guided biopsy for the highly suspicious calcifications in the left breast also recommended as detailed in the mammography report.        03/22/2017 Initial Biopsy    Diagnosis 03/22/17 Lymph node, needle/core biopsy, left axillary node -METASTATIC CARCINOMA, SEE COMMENT Microscopic Comment      03/22/2017 Receptors her2    Lymph node biopsy ER 95% positive, strong staining, PR 5% positive, weak staining, HER-2 negative, with HER2/CEP17 ratio 1.23 and copy #3.95.      03/27/2017 Pathology Results    Diagnosis 03/27/17 1. Breast, left, needle core biopsy -FIBROADENOMATOID NODULE WITH CALCIFICATIONS 2.Breast, left, needle core biopsy -DUCTAL CARCINOMA IN SITU, HIGH GRADE -SEE COMMENT  3. Breast, right, needle, core biopsy -FIBROADENOMA -NO MALIGNANCY IDENTIFIED       03/29/2017 Initial Diagnosis    Breast cancer metastasized to axillary lymph node, left ([place])     04/07/2017 Imaging    CT CAP 04/07/17 IMPRESSION: 1. Several prominent left axillary lymph nodes which may correspond to biopsy proven axillary lymph node metastasis. 2. No thoracic adenopathy identified or evidence of distant metastatic disease.      04/07/2017 Imaging    Bone whole body scan 04/07/17 IMPRESSION: Negative for evidence of osseous metastatic disease.      04/17/2017 Genetic Testing    BRCA2 c.[id number] (p.Glu260Serfs*15) pathogenic mutation identified on the 9 gene STAT panel.  The STAT Breast cancer panel offered by [facility] includes sequencing and rearrangement analysis for the following 9  genes:  ATM, BRCA1, BRCA2, CDH1, CHEK2, PALB2, PTEN, STK11 and TP53.   The report date is April 17, 2017.  UPDATE: BRCA2 c.778_779delGA (p.Glu260Serfs*15) pathogenic mutation and NF1 c.1166A>G  (p.His389Arg) VUS identified on the common hereditary cancer panel.  The Hereditary Gene Panel offered by [facility] includes sequencing and/or deletion duplication testing of the following 46 genes: APC, ATM, AXIN2, BARD1, BMPR1A, BRCA1, BRCA2, BRIP1, CDH1, CDKN2A (p14ARF), CDKN2A (p16INK4a), CHEK2, CTNNA1, DICER1, EPCAM (Deletion/duplication testing only), GREM1 (promoter region deletion/duplication testing only), KIT, MEN1, MLH1, MSH2, MSH3, MSH6, MUTYH, NBN, NF1, NHTL1, PALB2, PDGFRA, PMS2, POLD1, POLE, PTEN, RAD50, RAD51C, RAD51D, SDHB, SDHC, SDHD, SMAD4, SMARCA4. STK11, TP53, TSC1, TSC2, and VHL.  The following genes were evaluated for sequence changes only: SDHA and HOXB13 c.251G>A variant only.  The report date is April 29, 2017.       04/27/2017 Surgery    Surgey 04/27/17 BILATERAL SKIN SPARING MASTECTOMY and LEFT AXILLARY LYMPH NODE and BILATERAL BREAST RECONSTRUCTION WITH PLACEMENT OF TISSUE EXPANDER AND FLEX HD (ACELLULAR HYDRATED DERMIS) Bilateral BY Dr. Barry Dienes and Dr. Marla Roe       04/27/2017 Pathology Results     Diagnosis  04/27/17 1. Breast, simple mastectomy, Right - FIBROCYSTIC CHANGES WITH ADENOSIS. - USUAL DUCTAL HYPERPLASIA. - HEALING BIOPSY SITE. - THERE IS NO EVIDENCE OF MALIGNANCY. 2. Breast, simple mastectomy, Left - INVASIVE DUCTAL CARCINOMA, GRADE II/III, SPANNING 1.2 CM. - DUCTAL CARCINOMA IN SITU WITH CALCIFICATIONS, HIGH GRADE. - INVASIVE DUCTAL CARCINOMA IS FOCALLY PRESENT AT THE ANTERIOR MARGIN. - DUCTAL CARCINOMA IN SITU IS BROADLY 0.1 CM TO THE POSTERIOR MARGIN. - SEE ONCOLOGY TABLE BELOW. 3. Lymph nodes, regional resection, Left axillary - METASTATIC CARCINOMA IN 8 OF 21 LYMPH NODES (8/21). - SEE COMMENT       INTERVAL HISTORY: Ms. Bernath returns with her husband for follow-up as scheduled. She was last seen in our clinic on 05/27/2017, she has had a Port-A-Cath placed by Dr. Barry Dienes since that visit. She is recovering well from that procedure. She  continues with breast tissue expansion per plastic surgery, she had her last filler on Tuesday of this week, she reports worst pain on Fridays after tissue expansion. She uses Norco when necessary and performs her exercises as instructed. She has some left shoulder pain but attributes this to bad posture from breast pain. She otherwise feels well, energy level is slightly decreased and appetite is normal. She denies fever, recent infection, nausea or vomiting. She is due for cycle 1 of AC with on pro-today. She comes very well prepared having red lots of material on this regimen. She has a "cold cap" reduce hair loss during chemotherapy.   REVIEW OF SYSTEMS:   Constitutional: Denies fevers, chills or abnormal weight loss (+) mild fatigue Eyes: Denies blurriness of vision Ears, nose, mouth, throat, and face: Denies mucositis or sore throat Respiratory: Denies cough, dyspnea or wheezes Cardiovascular: Denies palpitation, chest discomfort or lower extremity swelling Gastrointestinal:  Denies nausea, vomiting, constipation, diarrhea, heartburn or change in bowel habits Skin: Denies abnormal skin rashes Lymphatics: Denies new lymphadenopathy or easy bruising Neurological:Denies numbness, tingling or new weaknesses Behavioral/Psych: Mood is stable, no new changes  Breasts: Currently undergoing tissue/fluid expansion All other systems were reviewed with the patient and are negative.  MEDICAL HISTORY:  Past Medical History:  Diagnosis Date  . Anemia   . Anxiety   . Cancer ([place]) 03/2017   left breast cancer  . Chronic ITP (idiopathic thrombocytopenia) ([facility])   . Family history of breast cancer   .  Family history of ovarian cancer   . Headache    last migraine...9/24  . Heart murmur    at birth.  none now  . MTHFR mutation [facility])     SURGICAL HISTORY: Past Surgical History:  Procedure Laterality Date  . AXILLARY LYMPH NODE DISSECTION Left 04/27/2017   Procedure: AXILLARY LYMPH NODE  DISSECTION;  Surgeon: [name], MD;  Location: [place];  Service: General;  Laterality: Left;  . BILATERAL TOTAL MASTECTOMY WITH AXILLARY LYMPH NODE DISSECTION Bilateral 04/27/2017   Procedure: SKIN SPARING MASTECTOMY;  Surgeon: [name], MD;  Location: [place];  Service: General;  Laterality: Bilateral;  . BREAST RECONSTRUCTION WITH PLACEMENT OF TISSUE EXPANDER AND FLEX HD (ACELLULAR HYDRATED DERMIS) Bilateral 04/27/2017   Procedure: BILATERAL BREAST RECONSTRUCTION WITH PLACEMENT OF TISSUE EXPANDER AND FLEX HD (ACELLULAR HYDRATED DERMIS);  Surgeon: Wallace Going, DO;  Location: [place];  Service: Plastics;  Laterality: Bilateral;  . BREAST SURGERY    . MASTECTOMY    . PORTACATH PLACEMENT Right 06/01/2017   Procedure: INSERTION PORT-A-CATH RIGHT SUBCLAVIAN;  Surgeon: [name], MD;  Location: [place];  Service: General;  Laterality: Right;  . WISDOM TOOTH EXTRACTION      I have reviewed the social history and family history with the patient and they are unchanged from previous note.  ALLERGIES:  is allergic to clindamycin; penicillins; and codeine.  MEDICATIONS:  Current Outpatient Prescriptions  Medication Sig Dispense Refill  . Cyanocobalamin (VITAMIN B-12 PO) Take 1 tablet by mouth daily.     Marland Kitchen HYDROcodone-acetaminophen (NORCO/VICODIN) 5-325 MG tablet Take 1 tablet by mouth every 4 (four) hours as needed for moderate pain. 30 tablet 0  . lidocaine-prilocaine (EMLA) cream Apply to affected area once 30 g 3  . Multiple Vitamins-Calcium (ONE-A-DAY WOMENS PO) Take 1 tablet by mouth daily.    . ondansetron (ZOFRAN) 8 MG tablet Take 1 tablet (8 mg total) by mouth 2 (two) times daily as needed. Start on the third day after chemotherapy. 30 tablet 1  . OVER THE COUNTER MEDICATION Take 1 capsule by mouth daily. Biotin/MSM    . prochlorperazine (COMPAZINE) 10 MG tablet Take 1 tablet (10 mg total) by mouth every 6 (six) hours as  needed (Nausea or vomiting). 30 tablet 1  . vitamin C (ASCORBIC ACID) 500 MG tablet Take 2,000 mg by mouth daily.     . diazepam (VALIUM) 2 MG tablet Take 1 tablet (2 mg total) by mouth every 6 (six) hours as needed for anxiety or muscle spasms. 30 tablet 0   Current Facility-Administered Medications  Medication Dose Route Frequency Provider Last Rate Last Dose  . LORazepam (ATIVAN) injection 0.5 mg  0.5 mg Intravenous Once [name], MD       Facility-Administered Medications Ordered in Other Visits  Medication Dose Route Frequency Provider Last Rate Last Dose  . cyclophosphamide (CYTOXAN) 940 mg in sodium chloride 0.9 % 250 mL chemo infusion  600 mg/m2 (Treatment Plan Recorded) Intravenous Once [name], MD      . heparin lock flush 100 unit/mL  500 Units Intracatheter Once PRN [name], MD      . pegfilgrastim (NEULASTA ONPRO KIT) injection 6 mg  6 mg Subcutaneous Once [name], MD      . sodium chloride flush (NS) 0.9 % injection 10 mL  10 mL Intracatheter PRN [name], MD        PHYSICAL EXAMINATION: ECOG PERFORMANCE STATUS: 1 - Symptomatic but completely  ambulatory  Vitals:   06/09/17 1006  BP: 98/70  Pulse: 61  Resp: 20  Temp: 97.8 F (36.6 C)  SpO2: 96%   Filed Weights   06/09/17 1006  Weight: 119 lb 4.8 oz (54.1 kg)    GENERAL:alert, no distress and comfortable SKIN: skin color, texture, turgor are normal, no rashes or significant lesions EYES: normal, Conjunctiva are pink and non-injected, sclera clear OROPHARYNX:no exudate, no erythema and lips, buccal mucosa, and tongue normal  NECK: supple, thyroid normal size, non-tender, without nodularity LYMPH:  no palpable cervical or supraclavicular lymphadenopathy  LUNGS: clear to auscultation bilaterally with normal breathing effort HEART: regular rate & rhythm and no murmurs and no lower extremity edema ABDOMEN:abdomen soft, non-tender and normal bowel sounds Musculoskeletal:no cyanosis of digits and no clubbing. No  left shoulder focal abnormalities.  NEURO: alert & oriented x 3 with fluent speech, no focal motor/sensory deficits Breasts: incisions are closed and well healed bilaterally, thin skin secondary to tissue expansion PAC without erythema.   LABORATORY DATA:  I have reviewed the data as listed CBC Latest Ref Rng & Units 06/09/2017 05/30/2017 03/29/2017  WBC 3.9 - 10.3 10e3/uL 6.5 8.1 5.2  Hemoglobin 11.6 - 15.9 g/dL 13.4 13.6 14.1  Hematocrit 34.8 - 46.6 % 40.4 40.5 41.7  Platelets 145 - 400 10e3/uL 180 188 158    CMP Latest Ref Rng & Units 06/09/2017 05/30/2017 03/29/2017  Glucose 70 - 140 mg/dl 89 106(H) 91  BUN 7.0 - 26.0 mg/dL 14.3 17 14.5  Creatinine 0.6 - 1.1 mg/dL 0.8 0.80 0.9  Sodium 136 - 145 mEq/L 139 137 140  Potassium 3.5 - 5.1 mEq/L 4.2 4.4 4.4  Chloride 101 - 111 mmol/L - 106 -  CO2 22 - 29 mEq/L _0 Calcium 8.4 - 10.4 mg/dL 9.5 9.4 9.6  Total Protein 6.4 - 8.3 g/dL 6.6 - 6.6  Total Bilirubin 0.20 - 1.20 mg/dL 0.38 - 0.32  Alkaline Phos 40 - 150 U/L 70 - 55  AST 5 - 34 U/L 14 - 12  ALT 0 - 55 U/L 9 - 12   PATHOLOGY   Diagnosis  04/27/17 1. Breast, simple mastectomy, Right - FIBROCYSTIC CHANGES WITH ADENOSIS. - USUAL DUCTAL HYPERPLASIA. - HEALING BIOPSY SITE. - THERE IS NO EVIDENCE OF MALIGNANCY. 2. Breast, simple mastectomy, Left - INVASIVE DUCTAL CARCINOMA, GRADE II/III, SPANNING 1.2 CM. - DUCTAL CARCINOMA IN SITU WITH CALCIFICATIONS, HIGH GRADE. - INVASIVE DUCTAL CARCINOMA IS FOCALLY PRESENT AT THE ANTERIOR MARGIN. - DUCTAL CARCINOMA IN SITU IS BROADLY 0.1 CM TO THE POSTERIOR MARGIN. - SEE ONCOLOGY TABLE BELOW. 3. Lymph nodes, regional resection, Left axillary - METASTATIC CARCINOMA IN 8 OF 21 LYMPH NODES (8/21). - SEE COMMENT. Microscopic Comment 2. BREAST, INVASIVE TUMOR Procedure: Simple mastectomy and axillary lymph node resections. Laterality: Left. Tumor Size: 1.2 cm (glass slide measurement). Histologic Type: Ductal Grade: II Tubular  Differentiation: 3 Nuclear Pleomorphism: 3 Mitotic Count: 1 Ductal Carcinoma in Situ (DCIS): Present, high grade, extensive. Extent of Tumor: Confined to breast parenchyma. Margins: Invasive carcinoma, distance from closest margin: Focally present at the anterior margin. DCIS, distance from closest margin: Broadly less than 0.1 cm to the posterior margin. Regional Lymph Nodes: Number of Lymph Nodes Examined: 21 1 of 4 FINAL for LAMYIA, CDEBACA [phone]) Microscopic Comment(continued) Lymph Nodes with Macrometastases: 6 Lymph Nodes with Micrometastases: 0 Lymph Nodes with Isolated Tumor Cells: 2 Breast Prognostic Profile: [id number] Estrogen Receptor: 95%, strong. Progesterone Receptor: 5%, strong. Her2: No amplification was  detected. The ratio is 1.23. Ki-67: 15%. Best tumor block for sendout testing: 2G Pathologic Stage Classification (pTNM, AJCC 8th Edition): Primary Tumor (pT): pT1c Regional Lymph Nodes (pN): pN2a Distant Metastases (pM): pMX Comments: Grossly, there is a 2.5 cm focus of indurated tissue identified. Histologic examination of this area reveals predominately high grade ductal carcinoma in situ with calcifications. There is associated invasive ductal carcinoma, which is E-cadherin positive, spanning 1.2 cm. (JBK:gt, 05/02/17) 3. Immunohistochemical stains for cytokeratin AE1/AE3 performed on multiple blocks on part 3 help highlight the presence of metastatic carcinoma. (JBK:gt, 05/02/17)   Diagnosis 03/27/17 1. Breast, left, needle core biopsy -FIBROADENOMATOID NODULE WITH CALCIFICATIONS 2.Breast, left, needle core biopsy -DUCTAL CARCINOMA IN SITU, HIGH GRADE -SEE COMMENT  3. Breast, right, needle, core biopsy -FIBROADENOMA -NO MALIGNANCY IDENTIFIED  Diagnosis 03/22/17 Lymph node, needle/core biopsy, left axillary node -METASTATIC CARCINOMA, SEE COMMENT Microscopic Comment Sections showed lymph node tissue mostly replaced by metastatic carcinoma  characterized by sheets of epithelioid appearing cells displaying milder to moderate nuclear pleomorphism, fine chromatin, small nucleoli, and moderately abundant amphophilic to eosinophilic cyto-plasma. Immunohistochemistry staining showed positive for cytokeratin AE1/AE3, GATA-3 and estrogen receptor, with patchy positivity for cytokeratin 7 and GCDFP., Only scattered cells showed progesterone positivity. Negative for S100, melan-a, E-cadherin, cytokeratin 20, CDX-2, TTF-1 or WT-1. The findings are consistent with metastatic carcinoma of breast primary. Lack of significant e-cadherin positivity suggested lobular carcinoma.   GENETICS 04/10/17    RADIOGRAPHIC STUDIES: I have personally reviewed the radiological images as listed and agreed with the findings in the report.  CT CAP 04/07/17 IMPRESSION: 1. Several prominent left axillary lymph nodes which may correspond to biopsy proven axillary lymph node metastasis. 2. No thoracic adenopathy identified or evidence of distant metastatic disease.   Bone whole body scan 04/07/17 IMPRESSION: Negative for evidence of osseous metastatic disease.  Diagnostic mammogram bilateral 03/21/17 IMPRESSION: INCOMPLETE - ADDITIONAL IMAGING EVALUATION NEEDED 1. The 0.6 cm oval mass in the right breast at 11 o'clock middle depth is indeterminate. An ultrasound is recommended.  2. The 0.5cm round focal asymmetry in the left breast central to the nipple middle depth is indeterminate. An ultrasound is recommended.  3. Ultrasound of the palpable abnormalities in the axilla and far left lateral breast is recommended.  4. Multiple clusters of pleomorphic calcifications in the left upper outer breast anterior depth spanning a 2.5 cm area are highly suggestive of malignancy. A stereotactic biopsy is recommended.   US Breast bilateral 03/21/17 IMPRESSION: HIGHLY SUGGESTIVE OF MALIGNANCY 1. Six distinct abnormal left axillary nodes, 2 of which correspond to the  palpable lumps felt by the patient. Findings concerning for metastatic adenopathy. Ultrasound guided biopsy of one of these nodes is recommended.  2. Small 5 mm palpable, oval mass in the left breast 3:00 areolar margin is at an intermediate suspicion. Ultrasound guided biopsy is recommended.  3. Isoechoic 8 mm mass in the right breast 10:00 5 cm from the nipple is also an intermediate suspicion for malignancy, and ultrasound guided biopsy is recommended.  4. Stereotactic guided biopsy for the highly suspicious calcifications in the left breast also recommended as detailed in the mammography report.    ASSESSMENT & PLAN:  Madison Savage is a 46 y.o. perimenopausal female with a history of Chronic ITP and MTHFR mutation, presented with a palpable left axillary adenopathy.  1. 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 She had a 1.2 cm left breast invasive ductal carcinoma, with 8 positive lymph nodes. Surgical margins were negative. -She is aware that due to her multiple positive lymph nodes, young age, she has high risk for recurrence.  -She will begin standard dose dense Adriamycin and Cytoxan every 2 weeks, for 4 cycles, followed by weekly Taxol or Abraxane for 12 weeks. Due to her BRCA2 mutation, she probably would benefit from carboplatin also, her primary oncologist will likely add [place] to weekly Taxol.  -The goal of therapy is curative.  -She has seen Dr. Lisbeth Renshaw for radiation consult due to multiple positive axillary lymph nodes -She is a candidate for adjuvant antiestrogen therapy given her ER/PR positive disease following adjuvant chemotherapy and radiation -She received a pretreatment baseline echocardiogram, her ejection fraction is 55-60%, she has normal LV systolic and diastolic  function, I reviewed this with the patient She and her husband have done extensive reading on the subject, she feels well prepared for chemotherapy, she brought a cold cap for hair preservation -We reviewed possible side effects of chemotherapy, antiemetic regimen, bone pain with Neulasta and Claritin for pain, and changes in her status that would require her to call our clinic such as diarrhea, fever, dehydration, persistent nausea and vomiting, severe fatigue, she verbalizes understanding. -Labs reviewed, CBC and semen are normal, exam is unremarkable, she'll proceed with cycle 1 AC with onPro today -she will return in 1 week for lab, flush, f/u with NP and IVF which she was requested  2. Chronic ITP -Discover with last pregnancy, platelet count between 50,000-60,000, now resolved, no current f/u -Her primary oncologist has discussed platelet count may drop significantly with chemotherapy, she may need steroids were additional ITP treatment -Platelets 180K today, adequate for treatment  3. BRCA2 mutation positive -MTHFR mutation, which was discovered during her workup before multiple miscarriage. -She underwent genetic testing for her breast cancer, which showed a pathological BRCA2 mutation and NF1 VUS mutation -De. Burr Medico previously discussed that BRCA2 mutation predicts significant high risk of breast cancer and colon cancer, and a prophylactic surgeries are recommended. -She has had bilateral mastectomy, due to the high-risk of breast cancer -She planned to have hysterectomy and BSO after she completes chemo and radiation  -Patient has met our genetic counselor Mrs. Powell who recommended BRCA2 G mutation screening in her children and siblings   PLAN: -Labs reviewed, unremarkable, physical exam WNL, will proceed with cycle 1 AC with OnPro today -She will return in 1 week for lab, flush, f/u with NP and IVF per patient's request -claritin for bone pain, begin day of chemo x5 days -reviewed  anti-emetic regimen, compazine and zofran PRN, begin zofran on day 3 after chemo  All questions were answered. The patient knows to call the clinic with any problems, questions or concerns. No barriers to learning was detected. I spent 20 counseling the patient face to face. The total time spent in the appointment was 25 and more than 50% was on counseling and review of test results     Alla Feeling, NP 06/09/17

## 2017-06-09 NOTE — Telephone Encounter (Signed)
Scheduled appt per 10/5 los - left message for patient to get new schedule next visit 10/12

## 2017-06-09 NOTE — Patient Instructions (Signed)

## 2017-06-09 NOTE — Progress Notes (Signed)
Pt brought ice cap to wear for one hour prior to Adriamycin.

## 2017-06-09 NOTE — Patient Instructions (Signed)
[facility] Discharge Instructions for Patients Receiving Chemotherapy  Today you received the following chemotherapy agents ADRIAMYCIN,CYTOXAN To help prevent nausea and vomiting after your treatment, we encourage you to take your nausea medication   prochlorperazine (COMPAZINE) 10 MG tablet 30 tablet 1 05/27/2017    Sig - Route: Take 1 tablet (10 mg total) by mouth every 6 (six) hours as needed (Nausea or vomiting). - Oral       If you develop nausea and vomiting that is not controlled by your nausea medication, call the clinic.   BELOW ARE SYMPTOMS THAT SHOULD BE REPORTED IMMEDIATELY:  *FEVER GREATER THAN 100.5 F  *CHILLS WITH OR WITHOUT FEVER  NAUSEA AND VOMITING THAT IS NOT CONTROLLED WITH YOUR NAUSEA MEDICATION  *UNUSUAL SHORTNESS OF BREATH  *UNUSUAL BRUISING OR BLEEDING  TENDERNESS IN MOUTH AND THROAT WITH OR WITHOUT PRESENCE OF ULCERS  *URINARY PROBLEMS  *BOWEL PROBLEMS  UNUSUAL RASH Items with * indicate a potential emergency and should be followed up as soon as possible.  Feel free to call the clinic should you have any questions or concerns. The clinic phone number is [phone].  Please show the [place] at check-in to the Emergency Department and triage nurse.

## 2017-06-10 LAB — CANCER ANTIGEN 27.29: CA 27.29: 5.3 U/mL (ref 0.0–38.6)

## 2017-06-12 ENCOUNTER — Telehealth: Payer: Self-pay | Admitting: *Deleted

## 2017-06-12 NOTE — Telephone Encounter (Signed)
Pt called with some c/o nausea. Discussed taking anti-nausea medications as recommended. Gave emotional support and encouragement. Validated feelings. Denies further questions or needs at this time.

## 2017-06-13 ENCOUNTER — Ambulatory Visit: Payer: BLUE CROSS/BLUE SHIELD | Admitting: Physical Therapy

## 2017-06-13 DIAGNOSIS — M25612 Stiffness of left shoulder, not elsewhere classified: Secondary | ICD-10-CM | POA: Diagnosis not present

## 2017-06-13 DIAGNOSIS — M25512 Pain in left shoulder: Secondary | ICD-10-CM | POA: Diagnosis not present

## 2017-06-13 DIAGNOSIS — R293 Abnormal posture: Secondary | ICD-10-CM | POA: Diagnosis not present

## 2017-06-13 DIAGNOSIS — M25511 Pain in right shoulder: Secondary | ICD-10-CM

## 2017-06-13 DIAGNOSIS — Z483 Aftercare following surgery for neoplasm: Secondary | ICD-10-CM | POA: Diagnosis not present

## 2017-06-13 DIAGNOSIS — M25611 Stiffness of right shoulder, not elsewhere classified: Secondary | ICD-10-CM

## 2017-06-13 NOTE — Therapy (Signed)
[place], [place], [address] Phone: [phone]   Fax:  [phone]  Physical Therapy Treatment  Patient Details  Name: Madison Savage MRN: [id number] Date of Birth: [birth year] Referring Provider: Dr. Burr Medico   Encounter Date: 06/13/2017      PT End of Session - 06/13/17 1751    Visit Number 3   Number of Visits 17   Date for PT Re-Evaluation 07/25/17   PT Start Time 4481   PT Stop Time 1655   PT Time Calculation (min) 45 min   Activity Tolerance Patient tolerated treatment well   Behavior During Therapy [facility] for tasks assessed/performed      Past Medical History:  Diagnosis Date  . Anemia   . Anxiety   . Cancer ([place]) 03/2017   left breast cancer  . Chronic ITP (idiopathic thrombocytopenia) (HCC)   . Family history of breast cancer   . Family history of ovarian cancer   . Headache    last migraine...9/24  . Heart murmur    at birth.  none now  . MTHFR mutation [facility])     Past Surgical History:  Procedure Laterality Date  . AXILLARY LYMPH NODE DISSECTION Left 04/27/2017   Procedure: AXILLARY LYMPH NODE DISSECTION;  Surgeon: [name], MD;  Location: [place];  Service: General;  Laterality: Left;  . BILATERAL TOTAL MASTECTOMY WITH AXILLARY LYMPH NODE DISSECTION Bilateral 04/27/2017   Procedure: SKIN SPARING MASTECTOMY;  Surgeon: [name], MD;  Location: [place];  Service: General;  Laterality: Bilateral;  . BREAST RECONSTRUCTION WITH PLACEMENT OF TISSUE EXPANDER AND FLEX HD (ACELLULAR HYDRATED DERMIS) Bilateral 04/27/2017   Procedure: BILATERAL BREAST RECONSTRUCTION WITH PLACEMENT OF TISSUE EXPANDER AND FLEX HD (ACELLULAR HYDRATED DERMIS);  Surgeon: Wallace Going, DO;  Location: [place];  Service: Plastics;  Laterality: Bilateral;  . BREAST SURGERY    . MASTECTOMY    . PORTACATH PLACEMENT Right 06/01/2017   Procedure:  INSERTION PORT-A-CATH RIGHT SUBCLAVIAN;  Surgeon: [name], MD;  Location: [place];  Service: General;  Laterality: Right;  . WISDOM TOOTH EXTRACTION      There were no vitals filed for this visit.      Subjective Assessment - 06/13/17 1745    Subjective Pt had chemo last Friday and had a rough time for several days after.  She feels that she has swelling around her port and has joint and back pain.  She felt the tg soft helped with her cording in her left arm    Pertinent History Patient was diagnosed on 03/21/17 with left invasive lobular carcinoma breast cancer. It measures 2.1 cm and is located in the upper outer quadrant. She also has 6 abnormal axillary lymph nodes and a node was biopsied and found to be positive for carcinoma.   04/27/2017 .bilateral mastecomy with ALND ( 21 nodes removed) with immedicate expanders. She is planning to have chemo, then have implant surgery and hysterectomy and then have radiation    Patient Stated Goals to get motion in arm back, return to exercise, and learn what to do to prevent lymphedema    Currently in Pain? Yes   Pain Score --  did not rate                          [facility] Adult PT Treatment/Exercise - 06/13/17 0001      Manual Therapy   Manual Therapy  Joint mobilization;Myofascial release;Scapular mobilization;Manual Lymphatic Drainage (MLD);Passive ROM   Joint Mobilization in supine, gentle posterir strech to both scaplar for pec strethc with myofacial release perceived    Myofascial Release prolonged trigger point release at left pec major tendon   Scapular Mobilization in right sidelying attempted scapular mobilizaion but scapula is very tight against wall. able to get inferior glide, in left sidelying precaution used to avoid pulling on port    Manual Lymphatic Drainage (MLD) instructed in basics of MLD.  instruced in diaphragmatic breathing    Passive ROM PROM to both arms in all planes of movment within pain limit pt with  tightness and tenderness in left arm                 PT Education - 06/13/17 1751    Education provided Yes   Education Details diaphragmatic breathing, encouraged continued scapular retraction            Short Term Clinic Goals - 05/25/17 1735      CC Short Term Goal  #1   Title pt will be knowledgable of lymphedema risk reduction practices    Time 4   Period Weeks   Status New     CC Short Term Goal  #2   Title Pt will be independent in postural and shoulder stretching exercises   Time 4   Period Weeks   Status New           Breast Clinic Goals - 03/29/17 1232      Patient will be able to verbalize understanding of pertinent lymphedema risk reduction practices relevant to her diagnosis specifically related to skin care.   Time 1   Period Days   Status Achieved     Patient will be able to return demonstrate and/or verbalize understanding of the post-op home exercise program related to regaining shoulder range of motion.   Time 1   Period Days   Status Achieved     Patient will be able to verbalize understanding of the importance of attending the postoperative After Breast Cancer Class for further lymphedema risk reduction education and therapeutic exercise.   Time 1   Period Days   Status Achieved          Long Term Clinic Goals - 05/25/17 1736      CC Long Term Goal  #1   Title Pt will have 140 degrees of left shoulder abduction so that she can received radiation treatment    Baseline 05/25/2017: 60 degrees    Time 8   Period Weeks   Status New     CC Long Term Goal  #2   Title Pt will ahve 150 degrees of right shoulder abduction so that she can perform household activities without difficulty    Baseline 05/25/2017: 95 degrees    Time 8   Period Weeks   Status New     CC Long Term Goal  #3   Title Pt will be independent in strength ABC program with knowledge of how to safely increase her intensity of exercise   Time 8   Period Weeks    Status New     CC Long Term Goal  #4   Title Pt will decrease Quick DASH score to < 20 indicating a function improvment of arms    Baseline 52.27 on 05/25/2017             Plan - 06/13/17 1752    Clinical Impression Statement Pt received  some relief from treatment but continues to have tightness in left upper quadrant, in anteior and posterior axilla with tender trigger points. Will focus on this next session    Clinical Impairments Affecting Rehab Potential 21 nodes removed in left axilla    PT Frequency 2x / week   PT Duration 8 weeks   PT Treatment/Interventions Therapeutic exercise;Patient/family education;ADLs/Self Care Home Management;Taping;DME Instruction;Dry needling;Functional mobility training;Therapeutic activities;Manual lymph drainage;Manual techniques;Passive range of motion   PT Next Visit Plan Focus on left shoulder for soft tissue work to anterior and posterior axila. continue with scapular mobility and PROM to both shoulders. , manual lymph drainage if needed for cording, add pulleys and wall stretches, ball on wall stretches. thoracic mobilty       Patient will benefit from skilled therapeutic intervention in order to improve the following deficits and impairments:     Visit Diagnosis: Aftercare following surgery for neoplasm  Stiffness of left shoulder, not elsewhere classified  Acute pain of left shoulder  Abnormal posture  Pain of right shoulder joint on movement  Stiffness of right shoulder, not elsewhere classified     Problem List Patient Active Problem List   Diagnosis Date Noted  . Breast cancer ([place]) 04/27/2017  . Genetic testing 04/17/2017  . BRCA2 gene mutation positive 04/17/2017  . Family history of breast cancer   . Family history of ovarian cancer   . Breast cancer metastasized to axillary lymph node, left ([place]) 03/29/2017  . Cough 03/10/2014  . Esophagitis 03/10/2014  . Chronic ITP (idiopathic thrombocytopenia) (HCC) 03/10/2014  .  ALLERGIC REACTION, ACUTE 07/22/2008   Donato Heinz. Owens Shark PT  Norwood Levo 06/13/2017, 5:56 PM  [place], [place], [address] Phone: [phone]   Fax:  [phone]  Name: Madison Savage MRN: [id number] Date of Birth: 27-Oct-1970

## 2017-06-15 ENCOUNTER — Ambulatory Visit: Payer: BLUE CROSS/BLUE SHIELD | Admitting: Physical Therapy

## 2017-06-15 ENCOUNTER — Encounter: Payer: Self-pay | Admitting: *Deleted

## 2017-06-15 DIAGNOSIS — R293 Abnormal posture: Secondary | ICD-10-CM

## 2017-06-15 DIAGNOSIS — M25512 Pain in left shoulder: Secondary | ICD-10-CM | POA: Diagnosis not present

## 2017-06-15 DIAGNOSIS — M25511 Pain in right shoulder: Secondary | ICD-10-CM

## 2017-06-15 DIAGNOSIS — Z483 Aftercare following surgery for neoplasm: Secondary | ICD-10-CM

## 2017-06-15 DIAGNOSIS — M25612 Stiffness of left shoulder, not elsewhere classified: Secondary | ICD-10-CM

## 2017-06-15 DIAGNOSIS — M25611 Stiffness of right shoulder, not elsewhere classified: Secondary | ICD-10-CM

## 2017-06-15 NOTE — Therapy (Signed)
[place], [place], [address] Phone: [phone]   Fax:  [phone]  Physical Therapy Treatment  Patient Details  Name: Madison Savage MRN: [id number] Date of Birth: 1971-07-05 Referring Provider: Dr. Burr Medico   Encounter Date: 06/15/2017      PT End of Session - 06/15/17 1617    Visit Number 4   Number of Visits 17   Date for PT Re-Evaluation 07/25/17   PT Start Time 1017   PT Stop Time 1602   PT Time Calculation (min) 47 min   Activity Tolerance Patient tolerated treatment well   Behavior During Therapy [facility] for tasks assessed/performed      Past Medical History:  Diagnosis Date  . Anemia   . Anxiety   . Cancer ([place]) 03/2017   left breast cancer  . Chronic ITP (idiopathic thrombocytopenia) (HCC)   . Family history of breast cancer   . Family history of ovarian cancer   . Headache    last migraine...9/24  . Heart murmur    at birth.  none now  . MTHFR mutation [facility])     Past Surgical History:  Procedure Laterality Date  . AXILLARY LYMPH NODE DISSECTION Left 04/27/2017   Procedure: AXILLARY LYMPH NODE DISSECTION;  Surgeon: [name], MD;  Location: [place];  Service: General;  Laterality: Left;  . BILATERAL TOTAL MASTECTOMY WITH AXILLARY LYMPH NODE DISSECTION Bilateral 04/27/2017   Procedure: SKIN SPARING MASTECTOMY;  Surgeon: [name], MD;  Location: [place];  Service: General;  Laterality: Bilateral;  . BREAST RECONSTRUCTION WITH PLACEMENT OF TISSUE EXPANDER AND FLEX HD (ACELLULAR HYDRATED DERMIS) Bilateral 04/27/2017   Procedure: BILATERAL BREAST RECONSTRUCTION WITH PLACEMENT OF TISSUE EXPANDER AND FLEX HD (ACELLULAR HYDRATED DERMIS);  Surgeon: Wallace Going, DO;  Location: [place];  Service: Plastics;  Laterality: Bilateral;  . BREAST SURGERY    . MASTECTOMY    . PORTACATH PLACEMENT Right 06/01/2017   Procedure:  INSERTION PORT-A-CATH RIGHT SUBCLAVIAN;  Surgeon: [name], MD;  Location: [place];  Service: General;  Laterality: Right;  . WISDOM TOOTH EXTRACTION      There were no vitals filed for this visit.      Subjective Assessment - 06/15/17 1524    Subjective Pt is feeling better today.  Her sister is here .  She has hydration tomorrow.                          [facility] Adult PT Treatment/Exercise - 06/15/17 0001      Shoulder Exercises: Pulleys   Flexion 2 minutes   ABduction 2 minutes     Shoulder Exercises: ROM/Strengthening   Wall Wash 10 reps    Ball on Wall yellow ball up the wall x 10 reps    Other ROM/Strengthening Exercises UE ranger with step forward with both legs for shoulder stretch      Manual Therapy   Manual Therapy Joint mobilization;Soft tissue mobilization   Joint Mobilization in supine, gentle posterir strech to both scaplar for pec strethc with myofacial release perceived    Soft tissue mobilization with biotone for soft tissue release with prolonged pressue to posterior axilla and deloid    Passive ROM AAROM for scpuala in all directions in sitting                    Short Term Clinic Goals - 05/25/17 1735  CC Short Term Goal  #1   Title pt will be knowledgable of lymphedema risk reduction practices    Time 4   Period Weeks   Status New     CC Short Term Goal  #2   Title Pt will be independent in postural and shoulder stretching exercises   Time 4   Period Weeks   Status New           Breast Clinic Goals - 03/29/17 1232      Patient will be able to verbalize understanding of pertinent lymphedema risk reduction practices relevant to her diagnosis specifically related to skin care.   Time 1   Period Days   Status Achieved     Patient will be able to return demonstrate and/or verbalize understanding of the post-op home exercise program related to regaining shoulder range of motion.   Time 1   Period Days    Status Achieved     Patient will be able to verbalize understanding of the importance of attending the postoperative After Breast Cancer Class for further lymphedema risk reduction education and therapeutic exercise.   Time 1   Period Days   Status Achieved          Long Term Clinic Goals - 05/25/17 1736      CC Long Term Goal  #1   Title Pt will have 140 degrees of left shoulder abduction so that she can received radiation treatment    Baseline 05/25/2017: 60 degrees    Time 8   Period Weeks   Status New     CC Long Term Goal  #2   Title Pt will ahve 150 degrees of right shoulder abduction so that she can perform household activities without difficulty    Baseline 05/25/2017: 95 degrees    Time 8   Period Weeks   Status New     CC Long Term Goal  #3   Title Pt will be independent in strength ABC program with knowledge of how to safely increase her intensity of exercise   Time 8   Period Weeks   Status New     CC Long Term Goal  #4   Title Pt will decrease Quick DASH score to < 20 indicating a function improvment of arms    Baseline 52.27 on 05/25/2017             Plan - 06/15/17 1617    Clinical Impression Statement Pt improved today with improved scapular movement.  She did well with pulleys and ball exercise and received relief from soft tissue work to left shoulder area    Clinical Impairments Affecting Rehab Potential 21 nodes removed in left axilla    PT Frequency 2x / week   PT Treatment/Interventions Therapeutic exercise;Patient/family education;ADLs/Self Care Home Management;Taping;DME Instruction;Dry needling;Functional mobility training;Therapeutic activities;Manual lymph drainage;Manual techniques;Passive range of motion   PT Next Visit Plan Focus on left and right  shoulder for soft tissue work to anterior and posterior axila. continue with scapular mobility and PROM to both shoulders. , manual lymph drainage if needed for cording, add pulleys and wall  stretches, ball on wall stretches. thoracic mobilty       Patient will benefit from skilled therapeutic intervention in order to improve the following deficits and impairments:  Decreased range of motion, Impaired UE functional use, Pain, Decreased knowledge of precautions, Postural dysfunction, Decreased knowledge of use of DME, Decreased strength, Increased fascial restricitons, Impaired perceived functional ability  Visit Diagnosis: Aftercare  following surgery for neoplasm  Stiffness of left shoulder, not elsewhere classified  Acute pain of left shoulder  Abnormal posture  Pain of right shoulder joint on movement  Stiffness of right shoulder, not elsewhere classified     Problem List Patient Active Problem List   Diagnosis Date Noted  . Breast cancer ([place]) 04/27/2017  . Genetic testing 04/17/2017  . BRCA2 gene mutation positive 04/17/2017  . Family history of breast cancer   . Family history of ovarian cancer   . Breast cancer metastasized to axillary lymph node, left ([place]) 03/29/2017  . Cough 03/10/2014  . Esophagitis 03/10/2014  . Chronic ITP (idiopathic thrombocytopenia) (HCC) 03/10/2014  . ALLERGIC REACTION, ACUTE 07/22/2008   Donato Heinz. Owens Shark PT  Norwood Levo 06/15/2017, 4:19 PM  [place], [place], [address] Phone: [phone]   Fax:  [phone]  Name: Madison Savage MRN: [id number] Date of Birth: May 30, 1971

## 2017-06-16 ENCOUNTER — Ambulatory Visit (HOSPITAL_BASED_OUTPATIENT_CLINIC_OR_DEPARTMENT_OTHER): Payer: BLUE CROSS/BLUE SHIELD

## 2017-06-16 ENCOUNTER — Other Ambulatory Visit (HOSPITAL_BASED_OUTPATIENT_CLINIC_OR_DEPARTMENT_OTHER): Payer: BLUE CROSS/BLUE SHIELD

## 2017-06-16 ENCOUNTER — Encounter: Payer: Self-pay | Admitting: Nurse Practitioner

## 2017-06-16 ENCOUNTER — Encounter: Payer: Self-pay | Admitting: *Deleted

## 2017-06-16 ENCOUNTER — Ambulatory Visit (HOSPITAL_BASED_OUTPATIENT_CLINIC_OR_DEPARTMENT_OTHER): Payer: BLUE CROSS/BLUE SHIELD | Admitting: Nurse Practitioner

## 2017-06-16 VITALS — BP 90/71 | HR 71 | Temp 98.4°F | Resp 18 | Ht 67.5 in | Wt 117.9 lb

## 2017-06-16 VITALS — BP 99/68 | HR 68 | Temp 98.1°F | Resp 18 | Ht 67.5 in

## 2017-06-16 DIAGNOSIS — D693 Immune thrombocytopenic purpura: Secondary | ICD-10-CM

## 2017-06-16 DIAGNOSIS — C50912 Malignant neoplasm of unspecified site of left female breast: Secondary | ICD-10-CM | POA: Diagnosis not present

## 2017-06-16 DIAGNOSIS — Z170 Estrogen receptor positive status [ER+]: Secondary | ICD-10-CM | POA: Diagnosis not present

## 2017-06-16 DIAGNOSIS — C773 Secondary and unspecified malignant neoplasm of axilla and upper limb lymph nodes: Secondary | ICD-10-CM

## 2017-06-16 DIAGNOSIS — Z95828 Presence of other vascular implants and grafts: Secondary | ICD-10-CM

## 2017-06-16 DIAGNOSIS — E860 Dehydration: Secondary | ICD-10-CM

## 2017-06-16 LAB — CBC WITH DIFFERENTIAL/PLATELET
BASO%: 0.6 % (ref 0.0–2.0)
Basophils Absolute: 0 10*3/uL (ref 0.0–0.1)
EOS%: 3.6 % (ref 0.0–7.0)
Eosinophils Absolute: 0.2 10*3/uL (ref 0.0–0.5)
HCT: 36.8 % (ref 34.8–46.6)
HGB: 12.5 g/dL (ref 11.6–15.9)
LYMPH%: 23.1 % (ref 14.0–49.7)
MCH: 30.6 pg (ref 25.1–34.0)
MCHC: 34 g/dL (ref 31.5–36.0)
MCV: 90 fL (ref 79.5–101.0)
MONO#: 0.6 10*3/uL (ref 0.1–0.9)
MONO%: 11.1 % (ref 0.0–14.0)
NEUT#: 3.1 10*3/uL (ref 1.5–6.5)
NEUT%: 61.6 % (ref 38.4–76.8)
Platelets: 115 10*3/uL — ABNORMAL LOW (ref 145–400)
RBC: 4.09 10*6/uL (ref 3.70–5.45)
RDW: 12.6 % (ref 11.2–14.5)
WBC: 5 10*3/uL (ref 3.9–10.3)
lymph#: 1.2 10*3/uL (ref 0.9–3.3)
nRBC: 0 % (ref 0–0)

## 2017-06-16 LAB — COMPREHENSIVE METABOLIC PANEL
ALT: 9 U/L (ref 0–55)
AST: 9 U/L (ref 5–34)
Albumin: 3.5 g/dL (ref 3.5–5.0)
Alkaline Phosphatase: 87 U/L (ref 40–150)
Anion Gap: 7 mEq/L (ref 3–11)
BUN: 12.5 mg/dL (ref 7.0–26.0)
CO2: 24 mEq/L (ref 22–29)
Calcium: 8.3 mg/dL — ABNORMAL LOW (ref 8.4–10.4)
Chloride: 110 mEq/L — ABNORMAL HIGH (ref 98–109)
Creatinine: 0.7 mg/dL (ref 0.6–1.1)
EGFR: >60 mL/min/{1.73_m2} (ref >60)
Glucose: 84 mg/dl (ref 70–140)
Potassium: 3.6 mEq/L (ref 3.5–5.1)
Sodium: 141 mEq/L (ref 136–145)
Total Bilirubin: 0.25 mg/dL (ref 0.20–1.20)
Total Protein: 5.7 g/dL — ABNORMAL LOW (ref 6.4–8.3)

## 2017-06-16 MED ORDER — HYDROCODONE-ACETAMINOPHEN 5-325 MG PO TABS: 1.0000 | ORAL_TABLET | ORAL | 0 refills | Status: DC | PRN

## 2017-06-16 MED ORDER — SODIUM CHLORIDE 0.9 % IV SOLN
1000.0000 mL | INTRAVENOUS | Status: DC
  Administered 2017-06-16: 1000 mL via INTRAVENOUS

## 2017-06-16 MED ORDER — HEPARIN SOD (PORK) LOCK FLUSH 100 UNIT/ML IV SOLN
500.0000 [IU] | Freq: Once | INTRAVENOUS | Status: DC
  Filled 2017-06-16: qty 5

## 2017-06-16 MED ORDER — SODIUM CHLORIDE 0.9% FLUSH
10.0000 mL | INTRAVENOUS | Status: DC | PRN
  Administered 2017-06-16: 10 mL via INTRAVENOUS
  Filled 2017-06-16: qty 10

## 2017-06-16 NOTE — Progress Notes (Signed)
Madison Savage  [phone] [phone]  Clinic Follow up Note   Patient Care Team: Madison Cookey, MD as PCP - Madison Citizen, MD as Consulting Physician (General Surgery) Madison Merle, MD as Consulting Physician (Hematology) Madison Rudd, MD as Consulting Physician (Radiation Oncology) Madison Few, MD as Consulting Physician (Obstetrics and Gynecology) 06/16/2017  SUMMARY OF ONCOLOGIC HISTORY: Oncology History   Cancer Staging Breast cancer metastasized to axillary lymph node, left [facility]) Staging form: Breast, AJCC 8th Edition - Clinical stage from 03/22/2017: Stage Unknown (cTX, cN1, cM0, GX, ER: Positive, PR: Positive, HER2: Negative) - Signed by Madison Merle, MD on 03/29/2017 \     Breast cancer metastasized to axillary lymph node, left ([place])   03/21/2017 Mammogram    Diagnostic mammogram bilateral 03/21/17 IMPRESSION: INCOMPLETE - ADDITIONAL IMAGING EVALUATION NEEDED 1. The 0.6 cm oval mass in the right breast at 11 o'clock middle depth is indeterminate. An ultrasound is recommended.  2. The 0.5cm round focal asymmetry in the left breast central to the nipple middle depth is indeterminate. An ultrasound is recommended.  3. Ultrasound of the palpable abnormalities in the axilla and far left lateral breast is recommended.  4. Multiple clusters of pleomorphic calcifications in the left upper outer breast anterior depth spanning a 2.5 cm area are highly suggestive of malignancy. A stereotactic biopsy is recommended.        03/21/2017 Imaging    US Breast bilateral 03/21/17 IMPRESSION: HIGHLY SUGGESTIVE OF MALIGNANCY 1. Six distinct abnormal left axillary nodes, 2 of which correspond to the palpable lumps felt by the patient. Findings concerning for metastatic adenopathy. Ultrasound guided biopsy of one of these nodes is recommended.  2. Small 5 mm palpable, oval mass in the left breast 3:00 areolar margin is at an intermediate suspicion.  Ultrasound guided biopsy is recommended.  3. Isoechoic 8 mm mass in the right breast 10:00 5 cm from the nipple is also an intermediate suspicion for malignancy, and ultrasound guided biopsy is recommended.  4. Stereotactic guided biopsy for the highly suspicious calcifications in the left breast also recommended as detailed in the mammography report.        03/22/2017 Initial Biopsy    Diagnosis 03/22/17 Lymph node, needle/core biopsy, left axillary node -METASTATIC CARCINOMA, SEE COMMENT Microscopic Comment      03/22/2017 Receptors her2    Lymph node biopsy ER 95% positive, strong staining, PR 5% positive, weak staining, HER-2 negative, with HER2/CEP17 ratio 1.23 and copy #3.95.      03/27/2017 Pathology Results    Diagnosis 03/27/17 1. Breast, left, needle core biopsy -FIBROADENOMATOID NODULE WITH CALCIFICATIONS 2.Breast, left, needle core biopsy -DUCTAL CARCINOMA IN SITU, HIGH GRADE -SEE COMMENT  3. Breast, right, needle, core biopsy -FIBROADENOMA -NO MALIGNANCY IDENTIFIED       03/29/2017 Initial Diagnosis    Breast cancer metastasized to axillary lymph node, left ([place])     04/07/2017 Imaging    CT CAP 04/07/17 IMPRESSION: 1. Several prominent left axillary lymph nodes which may correspond to biopsy proven axillary lymph node metastasis. 2. No thoracic adenopathy identified or evidence of distant metastatic disease.      04/07/2017 Imaging    Bone whole body scan 04/07/17 IMPRESSION: Negative for evidence of osseous metastatic disease.      04/17/2017 Genetic Testing    BRCA2 c.[id number] (p.Glu260Serfs*15) pathogenic mutation identified on the 9 gene STAT panel.  The STAT Breast cancer panel offered by [facility] includes sequencing and rearrangement analysis for the following 9  genes:  ATM, BRCA1, BRCA2, CDH1, CHEK2, PALB2, PTEN, STK11 and TP53.   The report date is April 17, 2017.  UPDATE: BRCA2 c.778_779delGA (p.Glu260Serfs*15) pathogenic mutation and NF1 c.1166A>G  (p.His389Arg) VUS identified on the common hereditary cancer panel.  The Hereditary Gene Panel offered by [facility] includes sequencing and/or deletion duplication testing of the following 46 genes: APC, ATM, AXIN2, BARD1, BMPR1A, BRCA1, BRCA2, BRIP1, CDH1, CDKN2A (p14ARF), CDKN2A (p16INK4a), CHEK2, CTNNA1, DICER1, EPCAM (Deletion/duplication testing only), GREM1 (promoter region deletion/duplication testing only), KIT, MEN1, MLH1, MSH2, MSH3, MSH6, MUTYH, NBN, NF1, NHTL1, PALB2, PDGFRA, PMS2, POLD1, POLE, PTEN, RAD50, RAD51C, RAD51D, SDHB, SDHC, SDHD, SMAD4, SMARCA4. STK11, TP53, TSC1, TSC2, and VHL.  The following genes were evaluated for sequence changes only: SDHA and HOXB13 c.251G>A variant only.  The report date is April 29, 2017.       04/27/2017 Surgery    Surgey 04/27/17 BILATERAL SKIN SPARING MASTECTOMY and LEFT AXILLARY LYMPH NODE and BILATERAL BREAST RECONSTRUCTION WITH PLACEMENT OF TISSUE EXPANDER AND FLEX HD (ACELLULAR HYDRATED DERMIS) Bilateral BY Dr. Barry Savage and Dr. Marla Savage       04/27/2017 Pathology Results     Diagnosis  04/27/17 1. Breast, simple mastectomy, Right - FIBROCYSTIC CHANGES WITH ADENOSIS. - USUAL DUCTAL HYPERPLASIA. - HEALING BIOPSY SITE. - THERE IS NO EVIDENCE OF MALIGNANCY. 2. Breast, simple mastectomy, Left - INVASIVE DUCTAL CARCINOMA, GRADE II/III, SPANNING 1.2 CM. - DUCTAL CARCINOMA IN SITU WITH CALCIFICATIONS, HIGH GRADE. - INVASIVE DUCTAL CARCINOMA IS FOCALLY PRESENT AT THE ANTERIOR MARGIN. - DUCTAL CARCINOMA IN SITU IS BROADLY 0.1 CM TO THE POSTERIOR MARGIN. - SEE ONCOLOGY TABLE BELOW. 3. Lymph nodes, regional resection, Left axillary - METASTATIC CARCINOMA IN 8 OF 21 LYMPH NODES (8/21). - SEE COMMENT       CURRENT THERAPY: Dose dense Adriamycin/Cytoxan with onPro neulasta support q2 weeks x4 cycles beginning 06/09/17  INTERVAL HISTORY: Madison Savage returns for follow-up as scheduled following cycle 1 AC with Neulasta on 06/09/2017. She reports that  she slept a lot on day 1, had "terrible fatigue" on days 2 to 3, then improved on day 4. She had bone pain with Neulasta 4 days. She notes occasional pain at her Port-A-Cath and tissue expander site. PA-C is accessed today with good blood return. She had minimal nausea, no vomiting, and mild diarrhea that resolved on its own. She has a good appetite but has begun to notice taste changes; she feels well hydrated. she denies fever or chills.   REVIEW OF SYSTEMS:   Constitutional: Denies fatigue,fevers, chills. Weight overall stable Eyes: Denies blurriness of vision Ears, nose, mouth, throat, and face: Denies mucositis or sore throat.  (+) occasional headache, present at baseline Respiratory: Denies cough, dyspnea or wheezes Cardiovascular: Denies palpitation, chest discomfort or lower extremity swelling (+) intermittent right chest PAC discomfort Gastrointestinal:  Denies nausea, vomiting, constipation, heartburn or change in bowel habits (+) mild diarrhea 1-2 days after chemotherapy Skin: Denies abnormal skin rashes Lymphatics: Denies new lymphadenopathy or easy bruising Neurological:Denies numbness, tingling or new weaknesses Behavioral/Psych: Mood is stable, no new changes  All other systems were reviewed with the patient and are negative.  MEDICAL HISTORY:  Past Medical History:  Diagnosis Date  . Anemia   . Anxiety   . Cancer ([place]) 03/2017   left breast cancer  . Chronic ITP (idiopathic thrombocytopenia) ([facility])   . Family history of breast cancer   . Family history of ovarian cancer   . Headache    last migraine...9/24  . Heart murmur  at birth.  none now  . MTHFR mutation [facility])     SURGICAL HISTORY: Past Surgical History:  Procedure Laterality Date  . AXILLARY LYMPH NODE DISSECTION Left 04/27/2017   Procedure: AXILLARY LYMPH NODE DISSECTION;  Surgeon: [name], MD;  Location: [place];  Service: General;  Laterality: Left;  . BILATERAL TOTAL MASTECTOMY  WITH AXILLARY LYMPH NODE DISSECTION Bilateral 04/27/2017   Procedure: SKIN SPARING MASTECTOMY;  Surgeon: [name], MD;  Location: [place];  Service: General;  Laterality: Bilateral;  . BREAST RECONSTRUCTION WITH PLACEMENT OF TISSUE EXPANDER AND FLEX HD (ACELLULAR HYDRATED DERMIS) Bilateral 04/27/2017   Procedure: BILATERAL BREAST RECONSTRUCTION WITH PLACEMENT OF TISSUE EXPANDER AND FLEX HD (ACELLULAR HYDRATED DERMIS);  Surgeon: Wallace Going, DO;  [facility];  Service: Plastics;  Laterality: Bilateral;  . BREAST SURGERY    . MASTECTOMY    . PORTACATH PLACEMENT Right 06/01/2017   Procedure: INSERTION PORT-A-CATH RIGHT SUBCLAVIAN;  Surgeon: [name], MD;  Location: [place];  Service: General;  Laterality: Right;  . WISDOM TOOTH EXTRACTION      I have reviewed the social history and family history with the patient and they are unchanged from previous note.  ALLERGIES:  is allergic to clindamycin; penicillins; and codeine.  MEDICATIONS:  Current Outpatient Prescriptions  Medication Sig Dispense Refill  . Cyanocobalamin (VITAMIN B-12 PO) Take 1 tablet by mouth daily.     . diazepam (VALIUM) 2 MG tablet Take 1 tablet (2 mg total) by mouth every 6 (six) hours as needed for anxiety or muscle spasms. 30 tablet 0  . HYDROcodone-acetaminophen (NORCO/VICODIN) 5-325 MG tablet Take 1 tablet by mouth every 4 (four) hours as needed for moderate pain. 30 tablet 0  . lidocaine-prilocaine (EMLA) cream Apply to affected area once 30 g 3  . Multiple Vitamins-Calcium (ONE-A-DAY WOMENS PO) Take 1 tablet by mouth daily.    . ondansetron (ZOFRAN) 8 MG tablet Take 1 tablet (8 mg total) by mouth 2 (two) times daily as needed. Start on the third day after chemotherapy. 30 tablet 1  . OVER THE COUNTER MEDICATION Take 1 capsule by mouth daily. Biotin/MSM    . prochlorperazine (COMPAZINE) 10 MG tablet Take 1 tablet (10 mg total) by mouth every 6 (six) hours as needed  (Nausea or vomiting). 30 tablet 1  . vitamin C (ASCORBIC ACID) 500 MG tablet Take 2,000 mg by mouth daily.      Current Facility-Administered Medications  Medication Dose Route Frequency Provider Last Rate Last Dose  . LORazepam (ATIVAN) injection 0.5 mg  0.5 mg Intravenous Once Madison Merle, MD        PHYSICAL EXAMINATION: ECOG PERFORMANCE STATUS: 1 - Symptomatic but completely ambulatory  Vitals:   06/16/17 1028  BP: 90/71  Pulse: 71  Resp: 18  Temp: 98.4 F (36.9 C)  SpO2: 100%   Filed Weights   06/16/17 1028  Weight: 117 lb 14.4 oz (53.5 kg)    GENERAL:alert, no distress and comfortable SKIN: skin color, texture, turgor are normal, no rashes or significant lesions EYES: normal, Conjunctiva are pink and non-injected, sclera clear OROPHARYNX:no exudate, no erythema and lips, buccal mucosa, and tongue normal  NECK: supple, thyroid normal size, non-tender, without nodularity LYMPH:  no palpable cervical or supraclavicular or axillarylymphadenopathy  LUNGS: clear to auscultation bilaterally with normal breathing effort HEART: regular rate & rhythm, S1 and S2 present, no murmurs and no lower extremity edema ABDOMEN:abdomen soft, flat,non-tender and normal bowel sounds. No  palpable hepatomegaly or masses Musculoskeletal:no cyanosis of digits and no clubbing  NEURO: alert & oriented x 3 with fluent speech, no focal motor/sensory deficits  LABORATORY DATA:  I have reviewed the data as listed CBC Latest Ref Rng & Units 06/16/2017 06/09/2017 05/30/2017  WBC 3.9 - 10.3 10e3/uL 5.0 6.5 8.1  Hemoglobin 11.6 - 15.9 g/dL 12.5 13.4 13.6  Hematocrit 34.8 - 46.6 % 36.8 40.4 40.5  Platelets 145 - 400 10e3/uL 115(L) 180 188     CMP Latest Ref Rng & Units 06/16/2017 06/09/2017 05/30/2017  Glucose 70 - 140 mg/dl 84 89 106(H)  BUN 7.0 - 26.0 mg/dL 12.5 14.3 17  Creatinine 0.6 - 1.1 mg/dL 0.7 0.8 0.80  Sodium 136 - 145 mEq/L 141 139 137  Potassium 3.5 - 5.1 mEq/L 3.6 4.2 4.4  Chloride 101 -  111 mmol/L - - 106  CO2 22 - 29 mEq/L '24 26 27  '$ Calcium 8.4 - 10.4 mg/dL 8.3(L) 9.5 9.4  Total Protein 6.4 - 8.3 g/dL 5.7(L) 6.6 -  Total Bilirubin 0.20 - 1.20 mg/dL 0.25 0.38 -  Alkaline Phos 40 - 150 U/L 87 70 -  AST 5 - 34 U/L 9 14 -  ALT 0 - 55 U/L 9 9 -    RADIOGRAPHIC STUDIES: I have personally reviewed the radiological images as listed and agreed with the findings in the report. No results found.   ASSESSMENT & PLAN: 46 year old perimenopausal female with a history of chronic ITP and MTHFR mutation, presented with a palpable left axillary adenopathy.   1. 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 Chronic ITP 3. BRCA2 mutation positive  Mrs. Stephani appears to have recovered well from her first cycle AC on 06/09/2017. She had severe fatigue for 2-3 days after treatment with mild diarrhea and bone pain related to Neulasta; all are resolved today. She has intermittent right chest pain at Port-A-Cath and tissue expander sites, controlled with Norco, no obvious signs of infection, blood return noted. I refilled this prescription today, CBC and see him at overall stable with the exception of mild thrombocytopenia, 115K. She denies bleeding. She will get IV fluids today as previously planned asand return in 1 week for cycle 2 AC with on pro.  Plan -Refill Norco today -IVF today -Return in 1 week for lab, flush, follow-up, cycle 2 AC  All questions were answered. The patient knows to call the clinic with any problems, questions or concerns. No barriers to learning was detected. I spent 20 counseling the patient face to face. The total time spent in the appointment was 25 and more than 50% was on counseling and review of test results     Alla Feeling, NP 06/16/17

## 2017-06-16 NOTE — Patient Instructions (Signed)
Implanted Port Home Guide An implanted port is a type of central line that is placed under the skin. Central lines are used to provide IV access when treatment or nutrition needs to be given through a person's veins. Implanted ports are used for long-term IV access. An implanted port may be placed because:  You need IV medicine that would be irritating to the small veins in your hands or arms.  You need long-term IV medicines, such as antibiotics.  You need IV nutrition for a long period.  You need frequent blood draws for lab tests.  You need dialysis.  Implanted ports are usually placed in the chest area, but they can also be placed in the upper arm, the abdomen, or the leg. An implanted port has two main parts:  Reservoir. The reservoir is round and will appear as a small, raised area under your skin. The reservoir is the part where a needle is inserted to give medicines or draw blood.  Catheter. The catheter is a thin, flexible tube that extends from the reservoir. The catheter is placed into a large vein. Medicine that is inserted into the reservoir goes into the catheter and then into the vein.  How will I care for my incision site? Do not get the incision site wet. Bathe or shower as directed by your health care provider. How is my port accessed? Special steps must be taken to access the port:  Before the port is accessed, a numbing cream can be placed on the skin. This helps numb the skin over the port site.  Your health care provider uses a sterile technique to access the port. ? Your health care provider must put on a mask and sterile gloves. ? The skin over your port is cleaned carefully with an antiseptic and allowed to dry. ? The port is gently pinched between sterile gloves, and a needle is inserted into the port.  Only "non-coring" port needles should be used to access the port. Once the port is accessed, a blood return should be checked. This helps ensure that the port  is in the vein and is not clogged.  If your port needs to remain accessed for a constant infusion, a clear (transparent) bandage will be placed over the needle site. The bandage and needle will need to be changed every week, or as directed by your health care provider.  Keep the bandage covering the needle clean and dry. Do not get it wet. Follow your health care provider's instructions on how to take a shower or bath while the port is accessed.  If your port does not need to stay accessed, no bandage is needed over the port.  What is flushing? Flushing helps keep the port from getting clogged. Follow your health care provider's instructions on how and when to flush the port. Ports are usually flushed with saline solution or a medicine called heparin. The need for flushing will depend on how the port is used.  If the port is used for intermittent medicines or blood draws, the port will need to be flushed: ? After medicines have been given. ? After blood has been drawn. ? As part of routine maintenance.  If a constant infusion is running, the port may not need to be flushed.  How long will my port stay implanted? The port can stay in for as long as your health care provider thinks it is needed. When it is time for the port to come out, surgery will be   done to remove it. The procedure is similar to the one performed when the port was put in. When should I seek immediate medical care? When you have an implanted port, you should seek immediate medical care if:  You notice a bad smell coming from the incision site.  You have swelling, redness, or drainage at the incision site.  You have more swelling or pain at the port site or the surrounding area.  You have a fever that is not controlled with medicine.  This information is not intended to replace advice given to you by your health care provider. Make sure you discuss any questions you have with your health care provider. Document  Released: 08/22/2005 Document Revised: 01/28/2016 Document Reviewed: 04/29/2013 Elsevier Interactive Patient Education  2017 [employer] Inc.  

## 2017-06-16 NOTE — Patient Instructions (Signed)
Dehydration, Adult Dehydration is when there is not enough fluid or water in your body. This happens when you lose more fluids than you take in. Dehydration can range from mild to very bad. It should be treated right away to keep it from getting very bad. Symptoms of mild dehydration may include:  Thirst.  Dry lips.  Slightly dry mouth.  Dry, warm skin.  Dizziness. Symptoms of moderate dehydration may include:  Very dry mouth.  Muscle cramps.  Dark pee (urine). Pee may be the color of tea.  Your body making less pee.  Your eyes making fewer tears.  Heartbeat that is uneven or faster than normal (palpitations).  Headache.  Light-headedness, especially when you stand up from sitting.  Fainting (syncope). Symptoms of very bad dehydration may include:  Changes in skin, such as: ? Cold and clammy skin. ? Blotchy (mottled) or pale skin. ? Skin that does not quickly return to normal after being lightly pinched and let go (poor skin turgor).  Changes in body fluids, such as: ? Feeling very thirsty. ? Your eyes making fewer tears. ? Not sweating when body temperature is high, such as in hot weather. ? Your body making very little pee.  Changes in vital signs, such as: ? Weak pulse. ? Pulse that is more than 100 beats a minute when you are sitting still. ? Fast breathing. ? Low blood pressure.  Other changes, such as: ? Sunken eyes. ? Cold hands and feet. ? Confusion. ? Lack of energy (lethargy). ? Trouble waking up from sleep. ? Short-term weight loss. ? Unconsciousness. Follow these instructions at home:  If told by your doctor, drink an ORS: ? Make an ORS by using instructions on the package. ? Start by drinking small amounts, about  cup (120 mL) every 5-10 minutes. ? Slowly drink more until you have had the amount that your doctor said to have.  Drink enough clear fluid to keep your pee clear or pale yellow. If you were told to drink an ORS, finish the ORS  first, then start slowly drinking clear fluids. Drink fluids such as: ? Water. Do not drink only water by itself. Doing that can make the salt (sodium) level in your body get too low (hyponatremia). ? Ice chips. ? Fruit juice that you have added water to (diluted). ? Low-calorie sports drinks.  Avoid: ? Alcohol. ? Drinks that have a lot of sugar. These include high-calorie sports drinks, fruit juice that does not have water added, and soda. ? Caffeine. ? Foods that are greasy or have a lot of fat or sugar.  Take over-the-counter and prescription medicines only as told by your doctor.  Do not take salt tablets. Doing that can make the salt level in your body get too high (hypernatremia).  Eat foods that have minerals (electrolytes). Examples include bananas, oranges, potatoes, tomatoes, and spinach.  Keep all follow-up visits as told by your doctor. This is important. Contact a doctor if:  You have belly (abdominal) pain that: ? Gets worse. ? Stays in one area (localizes).  You have a rash.  You have a stiff neck.  You get angry or annoyed more easily than normal (irritability).  You are more sleepy than normal.  You have a harder time waking up than normal.  You feel: ? Weak. ? Dizzy. ? Very thirsty.  You have peed (urinated) only a small amount of very dark pee during 6-8 hours. Get help right away if:  You have symptoms of   very bad dehydration.  You cannot drink fluids without throwing up (vomiting).  Your symptoms get worse with treatment.  You have a fever.  You have a very bad headache.  You are throwing up or having watery poop (diarrhea) and it: ? Gets worse. ? Does not go away.  You have blood or something green (bile) in your throw-up.  You have blood in your poop (stool). This may cause poop to look black and tarry.  You have not peed in 6-8 hours.  You pass out (faint).  Your heart rate when you are sitting still is more than 100 beats a  minute.  You have trouble breathing. This information is not intended to replace advice given to you by your health care provider. Make sure you discuss any questions you have with your health care provider. Document Released: 06/18/2009 Document Revised: 03/11/2016 Document Reviewed: 10/16/2015 Elsevier Interactive Patient Education  2018 [employer]  

## 2017-06-20 ENCOUNTER — Ambulatory Visit: Payer: BLUE CROSS/BLUE SHIELD | Admitting: Physical Therapy

## 2017-06-20 DIAGNOSIS — R293 Abnormal posture: Secondary | ICD-10-CM

## 2017-06-20 DIAGNOSIS — M25511 Pain in right shoulder: Secondary | ICD-10-CM | POA: Diagnosis not present

## 2017-06-20 DIAGNOSIS — M25512 Pain in left shoulder: Secondary | ICD-10-CM | POA: Diagnosis not present

## 2017-06-20 DIAGNOSIS — Z483 Aftercare following surgery for neoplasm: Secondary | ICD-10-CM | POA: Diagnosis not present

## 2017-06-20 DIAGNOSIS — M25611 Stiffness of right shoulder, not elsewhere classified: Secondary | ICD-10-CM | POA: Diagnosis not present

## 2017-06-20 DIAGNOSIS — M25612 Stiffness of left shoulder, not elsewhere classified: Secondary | ICD-10-CM

## 2017-06-20 NOTE — Therapy (Signed)
[place], [place], [address] Phone: [phone]   Fax:  [phone]  Physical Therapy Treatment  Patient Details  Name: Madison Savage MRN: [id number] Date of Birth: 1971/04/15 Referring Provider: Dr. Burr Medico   Encounter Date: 06/20/2017      PT End of Session - 06/20/17 1716    Visit Number 5   Number of Visits 17   Date for PT Re-Evaluation 07/25/17   PT Start Time 1600   PT Stop Time 1650   PT Time Calculation (min) 50 min   Activity Tolerance Patient tolerated treatment well   Behavior During Therapy [facility] for tasks assessed/performed      Past Medical History:  Diagnosis Date  . Anemia   . Anxiety   . Cancer ([place]) 03/2017   left breast cancer  . Chronic ITP (idiopathic thrombocytopenia) (HCC)   . Family history of breast cancer   . Family history of ovarian cancer   . Headache    last migraine...9/24  . Heart murmur    at birth.  none now  . MTHFR mutation [facility])     Past Surgical History:  Procedure Laterality Date  . AXILLARY LYMPH NODE DISSECTION Left 04/27/2017   Procedure: AXILLARY LYMPH NODE DISSECTION;  Surgeon: [name], MD;  Location: [place];  Service: General;  Laterality: Left;  . BILATERAL TOTAL MASTECTOMY WITH AXILLARY LYMPH NODE DISSECTION Bilateral 04/27/2017   Procedure: SKIN SPARING MASTECTOMY;  Surgeon: [name], MD;  Location: [place];  Service: General;  Laterality: Bilateral;  . BREAST RECONSTRUCTION WITH PLACEMENT OF TISSUE EXPANDER AND FLEX HD (ACELLULAR HYDRATED DERMIS) Bilateral 04/27/2017   Procedure: BILATERAL BREAST RECONSTRUCTION WITH PLACEMENT OF TISSUE EXPANDER AND FLEX HD (ACELLULAR HYDRATED DERMIS);  Surgeon: Wallace Going, DO;  Location: [place];  Service: Plastics;  Laterality: Bilateral;  . BREAST SURGERY    . MASTECTOMY    . PORTACATH PLACEMENT Right 06/01/2017   Procedure:  INSERTION PORT-A-CATH RIGHT SUBCLAVIAN;  Surgeon: [name], MD;  Location: [place];  Service: General;  Laterality: Right;  . WISDOM TOOTH EXTRACTION      There were no vitals filed for this visit.      Subjective Assessment - 06/20/17 1611    Subjective Pt reports they just got power today. ( had been off for 6 days)   Her arm has been feeling good and she has been painting a Ecologist.  She is having some back pain today    Pertinent History Patient was diagnosed on 03/21/17 with left invasive lobular carcinoma breast cancer. It measures 2.1 cm and is located in the upper outer quadrant. She also has 6 abnormal axillary lymph nodes and a node was biopsied and found to be positive for carcinoma.   04/27/2017 .bilateral mastecomy with ALND ( 21 nodes removed) with immedicate expanders. She is planning to have chemo, then have implant surgery and hysterectomy and then have radiation    Patient Stated Goals to get motion in arm back, return to exercise, and learn what to do to prevent lymphedema    Currently in Pain? Yes   Pain Score 2    Pain Location Back   Pain Orientation Posterior   Pain Descriptors / Indicators Aching;Discomfort   Pain Type Acute pain                         [facility] Adult PT Treatment/Exercise - 06/20/17  0001      Shoulder Exercises: Supine   External Rotation AROM;Right;Left  folded towel at waist pt did on both sides      Shoulder Exercises: Pulleys   Flexion 2 minutes   ABduction 2 minutes     Shoulder Exercises: ROM/Strengthening   Wall Wash 10 reps      Shoulder Exercises: Stretch   Corner Stretch 2 reps;5 reps   External Rotation Stretch 2 reps;20 seconds  cues to keep left shoulder down    Wall Stretch - Flexion 2 reps;20 seconds  in doorway    Other Shoulder Stretches "robber stretch"  with back agains wall      Manual Therapy   Manual Therapy Joint mobilization;Soft tissue mobilization   Joint Mobilization in supine, gentle  posterior strech to both scaplar for pec stretch. care taken to not stretch port on the right side    Soft tissue mobilization with biotone for soft tissue release with prolonged pressue to posterior axilla and deloid to both sides with more time spent on left side    Myofascial Release prolonged trigger point release at left pec major tendon "pulling" to left arm for porlonged time with releases felt by pt on opposite side of body , also crossed hands on left forearm and axilla    Passive ROM PROM to left shoulder                    Short Term Clinic Goals - 05/25/17 1735      CC Short Term Goal  #1   Title pt will be knowledgable of lymphedema risk reduction practices    Time 4   Period Weeks   Status New     CC Short Term Goal  #2   Title Pt will be independent in postural and shoulder stretching exercises   Time 4   Period Weeks   Status New           Breast Clinic Goals - 03/29/17 1232      Patient will be able to verbalize understanding of pertinent lymphedema risk reduction practices relevant to her diagnosis specifically related to skin care.   Time 1   Period Days   Status Achieved     Patient will be able to return demonstrate and/or verbalize understanding of the post-op home exercise program related to regaining shoulder range of motion.   Time 1   Period Days   Status Achieved     Patient will be able to verbalize understanding of the importance of attending the postoperative After Breast Cancer Class for further lymphedema risk reduction education and therapeutic exercise.   Time 1   Period Days   Status Achieved          Long Term Clinic Goals - 05/25/17 1736      CC Long Term Goal  #1   Title Pt will have 140 degrees of left shoulder abduction so that she can received radiation treatment    Baseline 05/25/2017: 60 degrees    Time 8   Period Weeks   Status New     CC Long Term Goal  #2   Title Pt will ahve 150 degrees of right shoulder  abduction so that she can perform household activities without difficulty    Baseline 05/25/2017: 95 degrees    Time 8   Period Weeks   Status New     CC Long Term Goal  #3   Title Pt will be independent  in strength ABC program with knowledge of how to safely increase her intensity of exercise   Time 8   Period Weeks   Status New     CC Long Term Goal  #4   Title Pt will decrease Quick DASH score to < 20 indicating a function improvment of arms    Baseline 52.27 on 05/25/2017             Plan - 06/20/17 1717    Clinical Impression Statement Pt continues to have tightness in anterior chest and with tight pec major tendons near expanders.  As a result , she tends to keep forward shoulder posture with rounded upper back and left shoulder elevated.  She responds somewhat to soft tissue work and myofascial release, but still has decreased range of motion    Clinical Impairments Affecting Rehab Potential 21 nodes removed in left axilla    PT Treatment/Interventions Therapeutic exercise;Patient/family education;ADLs/Self Care Home Management;Taping;DME Instruction;Dry needling;Functional mobility training;Therapeutic activities;Manual lymph drainage;Manual techniques;Passive range of motion   PT Next Visit Plan Cont with pulleys, ball on wall, wall, doorway and corner stretches.  AROM for scpuala especialy retraction and depression on left.  Continue with soft tissue work and myofascial release with PROM and joint mobilization as needed    Consulted and Agree with Plan of Care Patient      Patient will benefit from skilled therapeutic intervention in order to improve the following deficits and impairments:  Decreased range of motion, Impaired UE functional use, Pain, Decreased knowledge of precautions, Postural dysfunction, Decreased knowledge of use of DME, Decreased strength, Increased fascial restricitons, Impaired perceived functional ability  Visit Diagnosis: Aftercare following surgery  for neoplasm  Stiffness of left shoulder, not elsewhere classified  Acute pain of left shoulder  Abnormal posture  Pain of right shoulder joint on movement  Stiffness of right shoulder, not elsewhere classified     Problem List Patient Active Problem List   Diagnosis Date Noted  . Breast cancer ([place]) 04/27/2017  . Genetic testing 04/17/2017  . BRCA2 gene mutation positive 04/17/2017  . Family history of breast cancer   . Family history of ovarian cancer   . Breast cancer metastasized to axillary lymph node, left ([place]) 03/29/2017  . Cough 03/10/2014  . Esophagitis 03/10/2014  . Chronic ITP (idiopathic thrombocytopenia) (HCC) 03/10/2014  . ALLERGIC REACTION, ACUTE 07/22/2008   Madison Savage PT  Madison Savage 06/20/2017, 5:25 PM  [place], [place], [address] Phone: [phone]   Fax:  [phone]  Name: Madison Savage MRN: [id number] Date of Birth: [birth year]

## 2017-06-22 ENCOUNTER — Encounter: Payer: Self-pay | Admitting: Physical Therapy

## 2017-06-22 ENCOUNTER — Ambulatory Visit: Payer: BLUE CROSS/BLUE SHIELD | Admitting: Physical Therapy

## 2017-06-22 DIAGNOSIS — Z483 Aftercare following surgery for neoplasm: Secondary | ICD-10-CM | POA: Diagnosis not present

## 2017-06-22 DIAGNOSIS — R293 Abnormal posture: Secondary | ICD-10-CM

## 2017-06-22 DIAGNOSIS — M25512 Pain in left shoulder: Secondary | ICD-10-CM

## 2017-06-22 DIAGNOSIS — M25511 Pain in right shoulder: Secondary | ICD-10-CM

## 2017-06-22 DIAGNOSIS — M25612 Stiffness of left shoulder, not elsewhere classified: Secondary | ICD-10-CM

## 2017-06-22 DIAGNOSIS — M25611 Stiffness of right shoulder, not elsewhere classified: Secondary | ICD-10-CM

## 2017-06-22 NOTE — Therapy (Signed)
[place], [place], [address] Phone: [phone]   Fax:  [phone]  Physical Therapy Treatment  Patient Details  Name: Madison Savage MRN: [id number] Date of Birth: [birth year] Referring Provider: Dr. Burr Medico   Encounter Date: 06/22/2017      PT End of Session - 06/22/17 1656    Visit Number 6   Number of Visits 17   Date for PT Re-Evaluation 07/25/17   PT Start Time 0177   PT Stop Time 1650   PT Time Calculation (min) 45 min   Activity Tolerance Patient tolerated treatment well   Behavior During Therapy [facility] for tasks assessed/performed      Past Medical History:  Diagnosis Date  . Anemia   . Anxiety   . Cancer ([place]) 03/2017   left breast cancer  . Chronic ITP (idiopathic thrombocytopenia) (HCC)   . Family history of breast cancer   . Family history of ovarian cancer   . Headache    last migraine...9/24  . Heart murmur    at birth.  none now  . MTHFR mutation [facility])     Past Surgical History:  Procedure Laterality Date  . AXILLARY LYMPH NODE DISSECTION Left 04/27/2017   Procedure: AXILLARY LYMPH NODE DISSECTION;  Surgeon: [name], MD;  Location: [place];  Service: General;  Laterality: Left;  . BILATERAL TOTAL MASTECTOMY WITH AXILLARY LYMPH NODE DISSECTION Bilateral 04/27/2017   Procedure: SKIN SPARING MASTECTOMY;  Surgeon: [name], MD;  Location: [place];  Service: General;  Laterality: Bilateral;  . BREAST RECONSTRUCTION WITH PLACEMENT OF TISSUE EXPANDER AND FLEX HD (ACELLULAR HYDRATED DERMIS) Bilateral 04/27/2017   Procedure: BILATERAL BREAST RECONSTRUCTION WITH PLACEMENT OF TISSUE EXPANDER AND FLEX HD (ACELLULAR HYDRATED DERMIS);  Surgeon: Wallace Going, DO;  Location: [place];  Service: Plastics;  Laterality: Bilateral;  . BREAST SURGERY    . MASTECTOMY    . PORTACATH PLACEMENT Right 06/01/2017   Procedure:  INSERTION PORT-A-CATH RIGHT SUBCLAVIAN;  Surgeon: [name], MD;  Location: [place];  Service: General;  Laterality: Right;  . WISDOM TOOTH EXTRACTION      There were no vitals filed for this visit.      Subjective Assessment - 06/22/17 1607    Subjective My shoulder feels better. My shoulder isn't painful but it is still tight. My back has been hurting and my leg hurts. It hurts in my hip.    Pertinent History Patient was diagnosed on 03/21/17 with left invasive lobular carcinoma breast cancer. It measures 2.1 cm and is located in the upper outer quadrant. She also has 6 abnormal axillary lymph nodes and a node was biopsied and found to be positive for carcinoma.   04/27/2017 .bilateral mastecomy with ALND ( 21 nodes removed) with immedicate expanders. She is planning to have chemo, then have implant surgery and hysterectomy and then have radiation    Patient Stated Goals to get motion in arm back, return to exercise, and learn what to do to prevent lymphedema    Currently in Pain? Yes   Pain Score 1    Pain Location Back                         [facility] Adult PT Treatment/Exercise - 06/22/17 0001      Shoulder Exercises: Pulleys   Flexion 2 minutes   ABduction 2 minutes     Shoulder Exercises: ROM/Strengthening  Wall Wash 10 reps    Ball on Wall yellow ball up the wall x 10 reps      Shoulder Exercises: IT sales professional 5 reps   External Rotation Stretch --  cues to keep left shoulder down    Wall Stretch - Flexion 2 reps;20 seconds  in doorway    Other Shoulder Stretches "robber stretch"  with back agains wall x 10      Manual Therapy   Joint Mobilization in supine, gentle posterior strech to left scapla for pec stretch   Passive ROM PROM to left shoulder in direction of flexion, abduction and ER while providing pressure to pec to help release, then briefly to [place] [facility] Goals - 05/25/17 1735      CC Short Term  Goal  #1   Title pt will be knowledgable of lymphedema risk reduction practices    Time 4   Period Weeks   Status New     CC Short Term Goal  #2   Title Pt will be independent in postural and shoulder stretching exercises   Time 4   Period Weeks   Status New           Long Term Clinic Goals - 05/25/17 1736      CC Long Term Goal  #1   Title Pt will have 140 degrees of left shoulder abduction so that she can received radiation treatment    Baseline 05/25/2017: 60 degrees    Time 8   Period Weeks   Status New     CC Long Term Goal  #2   Title Pt will ahve 150 degrees of right shoulder abduction so that she can perform household activities without difficulty    Baseline 05/25/2017: 95 degrees    Time 8   Period Weeks   Status New     CC Long Term Goal  #3   Title Pt will be independent in strength ABC program with knowledge of how to safely increase her intensity of exercise   Time 8   Period Weeks   Status New     CC Long Term Goal  #4   Title Pt will decrease Quick DASH score to < 20 indicating a function improvment of arms    Baseline 52.27 on 05/25/2017             Plan - 06/22/17 1656    Clinical Impression Statement Pt continues to demonstrate very tight pec muscles which causes rounded shoulders. Continued with AAROM exercises today and stretches as well as PROM with manual pressure to stretch pecs. Pt stated she felt more loose in her shoulders following treatment session today. Educated pt to be mindful of posture and attempt to keep shoulders pulled back and down.    Rehab Potential Good   Clinical Impairments Affecting Rehab Potential 21 nodes removed in left axilla    PT Frequency 2x / week   PT Duration 8 weeks   PT Treatment/Interventions Therapeutic exercise;Patient/family education;ADLs/Self Care Home Management;Taping;DME Instruction;Dry needling;Functional mobility training;Therapeutic activities;Manual lymph drainage;Manual techniques;Passive range  of motion   PT Next Visit Plan Cont with pulleys, ball on wall, wall, doorway and corner stretches.  AROM for scpuala especialy retraction and depression on left.  Continue with soft tissue work and myofascial release with PROM and joint mobilization as needed    Consulted and Agree  with Plan of Care Patient      Patient will benefit from skilled therapeutic intervention in order to improve the following deficits and impairments:  Decreased range of motion, Impaired UE functional use, Pain, Decreased knowledge of precautions, Postural dysfunction, Decreased knowledge of use of DME, Decreased strength, Increased fascial restricitons, Impaired perceived functional ability  Visit Diagnosis: Aftercare following surgery for neoplasm  Stiffness of left shoulder, not elsewhere classified  Acute pain of left shoulder  Abnormal posture  Pain of right shoulder joint on movement  Stiffness of right shoulder, not elsewhere classified     Problem List Patient Active Problem List   Diagnosis Date Noted  . Breast cancer ([place]) 04/27/2017  . Genetic testing 04/17/2017  . BRCA2 gene mutation positive 04/17/2017  . Family history of breast cancer   . Family history of ovarian cancer   . Breast cancer metastasized to axillary lymph node, left ([place]) 03/29/2017  . Cough 03/10/2014  . Esophagitis 03/10/2014  . Chronic ITP (idiopathic thrombocytopenia) (HCC) 03/10/2014  . ALLERGIC REACTION, ACUTE 07/22/2008    Allyson Sabal [facility] 06/22/2017, 4:59 PM  [place], [place], [address] Phone: [phone]   Fax:  [phone]  Name: Madison Savage MRN: [id number] Date of Birth: Sep 25, 1970  Manus Gunning, PT 06/22/17 4:59 PM

## 2017-06-23 ENCOUNTER — Ambulatory Visit (HOSPITAL_BASED_OUTPATIENT_CLINIC_OR_DEPARTMENT_OTHER): Payer: BLUE CROSS/BLUE SHIELD | Admitting: Nurse Practitioner

## 2017-06-23 ENCOUNTER — Other Ambulatory Visit (HOSPITAL_BASED_OUTPATIENT_CLINIC_OR_DEPARTMENT_OTHER): Payer: BLUE CROSS/BLUE SHIELD

## 2017-06-23 ENCOUNTER — Ambulatory Visit: Payer: BLUE CROSS/BLUE SHIELD

## 2017-06-23 ENCOUNTER — Telehealth: Payer: Self-pay | Admitting: Nurse Practitioner

## 2017-06-23 ENCOUNTER — Ambulatory Visit (HOSPITAL_BASED_OUTPATIENT_CLINIC_OR_DEPARTMENT_OTHER): Payer: BLUE CROSS/BLUE SHIELD

## 2017-06-23 ENCOUNTER — Encounter: Payer: Self-pay | Admitting: Nurse Practitioner

## 2017-06-23 VITALS — BP 94/74 | HR 82 | Temp 98.0°F | Resp 18 | Ht 67.5 in | Wt 119.2 lb

## 2017-06-23 DIAGNOSIS — Z5111 Encounter for antineoplastic chemotherapy: Secondary | ICD-10-CM

## 2017-06-23 DIAGNOSIS — M898X9 Other specified disorders of bone, unspecified site: Secondary | ICD-10-CM

## 2017-06-23 DIAGNOSIS — F411 Generalized anxiety disorder: Secondary | ICD-10-CM

## 2017-06-23 DIAGNOSIS — C50912 Malignant neoplasm of unspecified site of left female breast: Secondary | ICD-10-CM

## 2017-06-23 DIAGNOSIS — C773 Secondary and unspecified malignant neoplasm of axilla and upper limb lymph nodes: Secondary | ICD-10-CM

## 2017-06-23 DIAGNOSIS — D693 Immune thrombocytopenic purpura: Secondary | ICD-10-CM

## 2017-06-23 DIAGNOSIS — Z5189 Encounter for other specified aftercare: Secondary | ICD-10-CM

## 2017-06-23 DIAGNOSIS — Z170 Estrogen receptor positive status [ER+]: Secondary | ICD-10-CM

## 2017-06-23 LAB — CBC WITH DIFFERENTIAL/PLATELET
BASO%: 0.6 % (ref 0.0–2.0)
Basophils Absolute: 0 10*3/uL (ref 0.0–0.1)
EOS%: 0.3 % (ref 0.0–7.0)
Eosinophils Absolute: 0 10*3/uL (ref 0.0–0.5)
HCT: 40.5 % (ref 34.8–46.6)
HGB: 13.3 g/dL (ref 11.6–15.9)
LYMPH%: 20.4 % (ref 14.0–49.7)
MCH: 30.5 pg (ref 25.1–34.0)
MCHC: 32.9 g/dL (ref 31.5–36.0)
MCV: 92.5 fL (ref 79.5–101.0)
MONO#: 0.6 10*3/uL (ref 0.1–0.9)
MONO%: 9.6 % (ref 0.0–14.0)
NEUT#: 4.4 10*3/uL (ref 1.5–6.5)
NEUT%: 69.1 % (ref 38.4–76.8)
Platelets: 188 10*3/uL (ref 145–400)
RBC: 4.37 10*6/uL (ref 3.70–5.45)
RDW: 13.4 % (ref 11.2–14.5)
WBC: 6.3 10*3/uL (ref 3.9–10.3)
lymph#: 1.3 10*3/uL (ref 0.9–3.3)

## 2017-06-23 LAB — COMPREHENSIVE METABOLIC PANEL
ALT: 10 U/L (ref 0–55)
AST: 11 U/L (ref 5–34)
Albumin: 3.7 g/dL (ref 3.5–5.0)
Alkaline Phosphatase: 87 U/L (ref 40–150)
Anion Gap: 7 mEq/L (ref 3–11)
BUN: 15.3 mg/dL (ref 7.0–26.0)
CO2: 26 mEq/L (ref 22–29)
Calcium: 9 mg/dL (ref 8.4–10.4)
Chloride: 107 mEq/L (ref 98–109)
Creatinine: 0.8 mg/dL (ref 0.6–1.1)
EGFR: >60 mL/min/{1.73_m2} (ref >60)
Glucose: 134 mg/dl (ref 70–140)
Potassium: 4.1 mEq/L (ref 3.5–5.1)
Sodium: 139 mEq/L (ref 136–145)
Total Bilirubin: <0.22 mg/dL (ref 0.20–1.20)
Total Protein: 6.2 g/dL — ABNORMAL LOW (ref 6.4–8.3)

## 2017-06-23 MED ORDER — PEGFILGRASTIM 6 MG/0.6ML ~~LOC~~ PSKT
6.0000 mg | PREFILLED_SYRINGE | Freq: Once | SUBCUTANEOUS | Status: AC
  Administered 2017-06-23: 6 mg via SUBCUTANEOUS
  Filled 2017-06-23: qty 0.6

## 2017-06-23 MED ORDER — SODIUM CHLORIDE 0.9% FLUSH
10.0000 mL | INTRAVENOUS | Status: DC | PRN
  Administered 2017-06-23: 10 mL
  Filled 2017-06-23: qty 10

## 2017-06-23 MED ORDER — LORAZEPAM 2 MG/ML IJ SOLN
INTRAMUSCULAR | Status: AC
  Filled 2017-06-23: qty 1

## 2017-06-23 MED ORDER — PROCHLORPERAZINE MALEATE 10 MG PO TABS
ORAL_TABLET | ORAL | Status: AC
  Filled 2017-06-23: qty 1

## 2017-06-23 MED ORDER — PALONOSETRON HCL INJECTION 0.25 MG/5ML
0.2500 mg | Freq: Once | INTRAVENOUS | Status: AC
  Administered 2017-06-23: 0.25 mg via INTRAVENOUS

## 2017-06-23 MED ORDER — SODIUM CHLORIDE 0.9 % IV SOLN
Freq: Once | INTRAVENOUS | Status: AC
  Administered 2017-06-23: 13:00:00 via INTRAVENOUS

## 2017-06-23 MED ORDER — PALONOSETRON HCL INJECTION 0.25 MG/5ML
INTRAVENOUS | Status: AC
  Filled 2017-06-23: qty 5

## 2017-06-23 MED ORDER — SODIUM CHLORIDE 0.9 % IV SOLN
Freq: Once | INTRAVENOUS | Status: AC
  Administered 2017-06-23: 13:00:00 via INTRAVENOUS
  Filled 2017-06-23: qty 5

## 2017-06-23 MED ORDER — TRAMADOL HCL 50 MG PO TABS: 50.0000 mg | ORAL_TABLET | Freq: Four times a day (QID) | ORAL | 1 refills | Status: DC | PRN

## 2017-06-23 MED ORDER — DOXORUBICIN HCL CHEMO IV INJECTION 2 MG/ML
60.0000 mg/m2 | Freq: Once | INTRAVENOUS | Status: AC
  Administered 2017-06-23: 94 mg via INTRAVENOUS
  Filled 2017-06-23: qty 47

## 2017-06-23 MED ORDER — HEPARIN SOD (PORK) LOCK FLUSH 100 UNIT/ML IV SOLN
500.0000 [IU] | Freq: Once | INTRAVENOUS | Status: AC | PRN
  Administered 2017-06-23: 500 [IU]
  Filled 2017-06-23: qty 5

## 2017-06-23 MED ORDER — SODIUM CHLORIDE 0.9 % IV SOLN
600.0000 mg/m2 | Freq: Once | INTRAVENOUS | Status: AC
  Administered 2017-06-23: 940 mg via INTRAVENOUS
  Filled 2017-06-23: qty 47

## 2017-06-23 MED ORDER — LORAZEPAM 2 MG/ML IJ SOLN
0.5000 mg | Freq: Once | INTRAMUSCULAR | Status: AC
  Administered 2017-06-23: 0.5 mg via INTRAVENOUS

## 2017-06-23 NOTE — Progress Notes (Signed)
[name]  [phone] [phone]  Clinic Follow up Note   Patient Care Team: Dorena Cookey, MD as PCP - Madison Citizen, MD as Consulting Physician (General Surgery) [name], MD as Consulting Physician (Hematology) Kyung Rudd, MD as Consulting Physician (Radiation Oncology) Brien Few, MD as Consulting Physician (Obstetrics and Gynecology) 06/23/2017  SUMMARY OF ONCOLOGIC HISTORY: Oncology History   Cancer Staging Breast cancer metastasized to axillary lymph node, left [facility]) Staging form: Breast, AJCC 8th Edition - Clinical stage from 03/22/2017: Stage Unknown (cTX, cN1, cM0, GX, ER: Positive, PR: Positive, HER2: Negative) - Signed by [name], MD on 03/29/2017 \     Breast cancer metastasized to axillary lymph node, left ([place])   03/21/2017 Mammogram    Diagnostic mammogram bilateral 03/21/17 IMPRESSION: INCOMPLETE - ADDITIONAL IMAGING EVALUATION NEEDED 1. The 0.6 cm oval mass in the right breast at 11 o'clock middle depth is indeterminate. An ultrasound is recommended.  2. The 0.5cm round focal asymmetry in the left breast central to the nipple middle depth is indeterminate. An ultrasound is recommended.  3. Ultrasound of the palpable abnormalities in the axilla and far left lateral breast is recommended.  4. Multiple clusters of pleomorphic calcifications in the left upper outer breast anterior depth spanning a 2.5 cm area are highly suggestive of malignancy. A stereotactic biopsy is recommended.        03/21/2017 Imaging    US Breast bilateral 03/21/17 IMPRESSION: HIGHLY SUGGESTIVE OF MALIGNANCY 1. Six distinct abnormal left axillary nodes, 2 of which correspond to the palpable lumps felt by the patient. Findings concerning for metastatic adenopathy. Ultrasound guided biopsy of one of these nodes is recommended.  2. Small 5 mm palpable, oval mass in the left breast 3:00 areolar margin is at an intermediate suspicion.  Ultrasound guided biopsy is recommended.  3. Isoechoic 8 mm mass in the right breast 10:00 5 cm from the nipple is also an intermediate suspicion for malignancy, and ultrasound guided biopsy is recommended.  4. Stereotactic guided biopsy for the highly suspicious calcifications in the left breast also recommended as detailed in the mammography report.        03/22/2017 Initial Biopsy    Diagnosis 03/22/17 Lymph node, needle/core biopsy, left axillary node -METASTATIC CARCINOMA, SEE COMMENT Microscopic Comment      03/22/2017 Receptors her2    Lymph node biopsy ER 95% positive, strong staining, PR 5% positive, weak staining, HER-2 negative, with HER2/CEP17 ratio 1.23 and copy #3.95.      03/27/2017 Pathology Results    Diagnosis 03/27/17 1. Breast, left, needle core biopsy -FIBROADENOMATOID NODULE WITH CALCIFICATIONS 2.Breast, left, needle core biopsy -DUCTAL CARCINOMA IN SITU, HIGH GRADE -SEE COMMENT  3. Breast, right, needle, core biopsy -FIBROADENOMA -NO MALIGNANCY IDENTIFIED       03/29/2017 Initial Diagnosis    Breast cancer metastasized to axillary lymph node, left ([place])     04/07/2017 Imaging    CT CAP 04/07/17 IMPRESSION: 1. Several prominent left axillary lymph nodes which may correspond to biopsy proven axillary lymph node metastasis. 2. No thoracic adenopathy identified or evidence of distant metastatic disease.      04/07/2017 Imaging    Bone whole body scan 04/07/17 IMPRESSION: Negative for evidence of osseous metastatic disease.      04/17/2017 Genetic Testing    BRCA2 c.[id number] (p.Glu260Serfs*15) pathogenic mutation identified on the 9 gene STAT panel.  The STAT Breast cancer panel offered by [facility] includes sequencing and rearrangement analysis for the following 9  genes:  ATM, BRCA1, BRCA2, CDH1, CHEK2, PALB2, PTEN, STK11 and TP53.   The report date is April 17, 2017.  UPDATE: BRCA2 c.778_779delGA (p.Glu260Serfs*15) pathogenic mutation and NF1 c.1166A>G  (p.His389Arg) VUS identified on the common hereditary cancer panel.  The Hereditary Gene Panel offered by [facility] includes sequencing and/or deletion duplication testing of the following 46 genes: APC, ATM, AXIN2, BARD1, BMPR1A, BRCA1, BRCA2, BRIP1, CDH1, CDKN2A (p14ARF), CDKN2A (p16INK4a), CHEK2, CTNNA1, DICER1, EPCAM (Deletion/duplication testing only), GREM1 (promoter region deletion/duplication testing only), KIT, MEN1, MLH1, MSH2, MSH3, MSH6, MUTYH, NBN, NF1, NHTL1, PALB2, PDGFRA, PMS2, POLD1, POLE, PTEN, RAD50, RAD51C, RAD51D, SDHB, SDHC, SDHD, SMAD4, SMARCA4. STK11, TP53, TSC1, TSC2, and VHL.  The following genes were evaluated for sequence changes only: SDHA and HOXB13 c.251G>A variant only.  The report date is April 29, 2017.       04/27/2017 Surgery    Surgey 04/27/17 BILATERAL SKIN SPARING MASTECTOMY and LEFT AXILLARY LYMPH NODE and BILATERAL BREAST RECONSTRUCTION WITH PLACEMENT OF TISSUE EXPANDER AND FLEX HD (ACELLULAR HYDRATED DERMIS) Bilateral BY Dr. Barry Dienes and Dr. Marla Roe       04/27/2017 Pathology Results     Diagnosis  04/27/17 1. Breast, simple mastectomy, Right - FIBROCYSTIC CHANGES WITH ADENOSIS. - USUAL DUCTAL HYPERPLASIA. - HEALING BIOPSY SITE. - THERE IS NO EVIDENCE OF MALIGNANCY. 2. Breast, simple mastectomy, Left - INVASIVE DUCTAL CARCINOMA, GRADE II/III, SPANNING 1.2 CM. - DUCTAL CARCINOMA IN SITU WITH CALCIFICATIONS, HIGH GRADE. - INVASIVE DUCTAL CARCINOMA IS FOCALLY PRESENT AT THE ANTERIOR MARGIN. - DUCTAL CARCINOMA IN SITU IS BROADLY 0.1 CM TO THE POSTERIOR MARGIN. - SEE ONCOLOGY TABLE BELOW. 3. Lymph nodes, regional resection, Left axillary - METASTATIC CARCINOMA IN 8 OF 21 LYMPH NODES (8/21). - SEE COMMENT      CURRENT THERAPY: Dose dense Adriamycin/Cytoxan with onPro neulasta support q2 weeks x4 cycles beginning 06/09/17  INTERVAL HISTORY: Madison Savage returns today for follow-up as scheduled prior to cycle 2 AC. She received IV hydration last week. She  has good appetite and energy level currently. She reports some "chemotherapy brain" with word forgetfulness.She has mild heartburn, drinking room temperature water and pH level water helps.she had one episode of transient ringing in her ears with mild headache that resolved after medication, she has history of migraines. She continues physical therapy twice weekly, left shoulder discomfort is improving. She denies pain at [facility] site.  REVIEW OF SYSTEMS:   Constitutional: Denies fatigue,fevers, chills or abnormal weight loss. (+) Good appetite (+) stable weight Eyes: Denies blurriness of vision Ears, nose, mouth, throat, and face: Denies mucositis or sore throat (+) transient episode of ringing in ears and headache, resolved with medication Respiratory: Denies cough, dyspnea or wheezes Cardiovascular: Denies palpitation, chest discomfort or lower extremity swelling Gastrointestinal:  Denies nausea, vomiting, constipation, diarrhea, or change in bowel habits (+) heartburn Skin: Denies abnormal skin rashes Lymphatics: Denies new lymphadenopathy or easy bruising Neurological:Denies numbness, tingling or new weaknesses Behavioral/Psych: Mood is stable, no new changes  MSK (+) significant bone pain with Neulasta, no improvement with Claritin. Resolved today All other systems were reviewed with the patient and are negative.  MEDICAL HISTORY:  Past Medical History:  Diagnosis Date  . Anemia   . Anxiety   . Cancer ([place]) 03/2017   left breast cancer  . Chronic ITP (idiopathic thrombocytopenia) (HCC)   . Family history of breast cancer   . Family history of ovarian cancer   . Headache    last migraine...9/24  . Heart murmur  at birth.  none now  . MTHFR mutation [facility])     SURGICAL HISTORY: Past Surgical History:  Procedure Laterality Date  . AXILLARY LYMPH NODE DISSECTION Left 04/27/2017   Procedure: AXILLARY LYMPH NODE DISSECTION;  Surgeon: [name], MD;  Location: [place];  Service: General;  Laterality: Left;  . BILATERAL TOTAL MASTECTOMY WITH AXILLARY LYMPH NODE DISSECTION Bilateral 04/27/2017   Procedure: SKIN SPARING MASTECTOMY;  Surgeon: [name], MD;  Location: [place];  Service: General;  Laterality: Bilateral;  . BREAST RECONSTRUCTION WITH PLACEMENT OF TISSUE EXPANDER AND FLEX HD (ACELLULAR HYDRATED DERMIS) Bilateral 04/27/2017   Procedure: BILATERAL BREAST RECONSTRUCTION WITH PLACEMENT OF TISSUE EXPANDER AND FLEX HD (ACELLULAR HYDRATED DERMIS);  Surgeon: Wallace Going, DO;  Location: [place];  Service: Plastics;  Laterality: Bilateral;  . BREAST SURGERY    . MASTECTOMY    . PORTACATH PLACEMENT Right 06/01/2017   Procedure: INSERTION PORT-A-CATH RIGHT SUBCLAVIAN;  Surgeon: [name], MD;  Location: [place];  Service: General;  Laterality: Right;  . WISDOM TOOTH EXTRACTION      I have reviewed the social history and family history with the patient and they are unchanged from previous note.  ALLERGIES:  is allergic to clindamycin; penicillins; and codeine.  MEDICATIONS:  Current Outpatient Prescriptions  Medication Sig Dispense Refill  . Cyanocobalamin (VITAMIN B-12 PO) Take 1 tablet by mouth daily.     . diazepam (VALIUM) 2 MG tablet Take 1 tablet (2 mg total) by mouth every 6 (six) hours as needed for anxiety or muscle spasms. 30 tablet 0  . HYDROcodone-acetaminophen (NORCO/VICODIN) 5-325 MG tablet Take 1 tablet by mouth every 4 (four) hours as needed for moderate pain. 30 tablet 0  . lidocaine-prilocaine (EMLA) cream Apply to affected area once 30 g 3  . Multiple Vitamins-Calcium (ONE-A-DAY WOMENS PO) Take 1 tablet by mouth daily.    . ondansetron (ZOFRAN) 8 MG tablet Take 1 tablet (8 mg total) by mouth 2 (two) times daily as needed. Start on the third day after chemotherapy. 30 tablet 1  . OVER THE COUNTER MEDICATION Take 1 capsule by mouth daily. Biotin/MSM    . prochlorperazine (COMPAZINE) 10  MG tablet Take 1 tablet (10 mg total) by mouth every 6 (six) hours as needed (Nausea or vomiting). 30 tablet 1  . vitamin C (ASCORBIC ACID) 500 MG tablet Take 2,000 mg by mouth daily.     . traMADol (ULTRAM) 50 MG tablet Take 1 tablet (50 mg total) by mouth every 6 (six) hours as needed. 15 tablet 1   Current Facility-Administered Medications  Medication Dose Route Frequency Provider Last Rate Last Dose  . LORazepam (ATIVAN) injection 0.5 mg  0.5 mg Intravenous Once [name], MD       Facility-Administered Medications Ordered in Other Visits  Medication Dose Route Frequency Provider Last Rate Last Dose  . cyclophosphamide (CYTOXAN) 940 mg in sodium chloride 0.9 % 250 mL chemo infusion  600 mg/m2 (Treatment Plan Recorded) Intravenous Once [name], MD      . DOXOrubicin (ADRIAMYCIN) chemo injection 94 mg  60 mg/m2 (Treatment Plan Recorded) Intravenous Once [name], MD      . fosaprepitant (EMEND) 150 mg, dexamethasone (DECADRON) 12 mg in sodium chloride 0.9 % 145 mL IVPB   Intravenous Once [name], MD 454 mL/hr at 06/23/17 1255    . LORazepam (ATIVAN) injection 0.5 mg  0.5 mg Intravenous Once Alla Feeling, NP      .  pegfilgrastim (NEULASTA ONPRO KIT) injection 6 mg  6 mg Subcutaneous Once [name], MD        PHYSICAL EXAMINATION: ECOG PERFORMANCE STATUS: 1 - Symptomatic but completely ambulatory  Vitals:   06/23/17 0938  BP: 94/74  Pulse: 82  Resp: 18  Temp: 98 F (36.7 C)  SpO2: 98%   Filed Weights   06/23/17 0938  Weight: 119 lb 3.2 oz (54.1 kg)    GENERAL:alert, no distress and comfortable SKIN: skin color, texture, turgor are normal, no rashes or significant lesions EYES: normal, Conjunctiva are pink and non-injected, sclera clear OROPHARYNX:no exudate, no erythema and lips, buccal mucosa, and tongue normal  NECK: supple, thyroid normal size, non-tender, without nodularity LYMPH:  no palpable cervical, supraclavicular, axillary lymphadenopathy  LUNGS: clear to  auscultation bilaterally with normal breathing effort HEART: regular rate & rhythm, S1 and S2 normal, no murmurs and no lower extremity edema ABDOMEN:abdomen soft, flat, non-tender and normal bowel sounds. No palpable organomegaly or masses. Musculoskeletal:no cyanosis of digits and no clubbing  NEURO: alert & oriented x 3 with fluent speech, no focal motor/sensory deficits Breasts: status post bilateral mastectomy with reconstruction with tissue expanders incisions well-healed, no erythema or drainage.  LABORATORY DATA:  I have reviewed the data as listed CBC Latest Ref Rng & Units 06/23/2017 06/16/2017 06/09/2017  WBC 3.9 - 10.3 10e3/uL 6.3 5.0 6.5  Hemoglobin 11.6 - 15.9 g/dL 13.3 12.5 13.4  Hematocrit 34.8 - 46.6 % 40.5 36.8 40.4  Platelets 145 - 400 10e3/uL 188 115(L) 180    CMP Latest Ref Rng & Units 06/23/2017 06/16/2017 06/09/2017  Glucose 70 - 140 mg/dl 134 84 89  BUN 7.0 - 26.0 mg/dL 15.3 12.5 14.3  Creatinine 0.6 - 1.1 mg/dL 0.8 0.7 0.8  Sodium 136 - 145 mEq/L 139 141 139  Potassium 3.5 - 5.1 mEq/L 4.1 3.6 4.2  Chloride 101 - 111 mmol/L - - -  CO2 22 - 29 mEq/L _0 Calcium 8.4 - 10.4 mg/dL 9.0 8.3(L) 9.5  Total Protein 6.4 - 8.3 g/dL 6.2(L) 5.7(L) 6.6  Total Bilirubin 0.20 - 1.20 mg/dL <0.22 0.25 0.38  Alkaline Phos 40 - 150 U/L 87 87 70  AST 5 - 34 U/L _1 ALT 0 - 55 U/L _2 RADIOGRAPHIC STUDIES: I have personally reviewed the radiological images as listed and agreed with the findings in the report. No results found.   ASSESSMENT & PLAN: 46 year old perimenopausal female with a history of chronic ITP and MTHFR mutation, presented with a palpable left axillary adenopathy.   1. 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 Chronic ITP 3. BRCA2 mutation positive  Mrs. Madison Savage appears stable today, she has recovered well from cycle 1 AC. Claritin was not sufficient  for bone pain, I have prescribed tramadol when necessary. She continues to use cold For hair preservation. Vital signs stable, CBC normal, Cmet unremarkable. Heartburn likely secondary to steroids and chemotherapy. She will proceed with cycle 2 AC with Onpro today, return in 1 week for lab, flush, IV hydration.  Plan -Labs reviewed, proceed with cycle 2 AC with Onpro today -prescription for tramadol -Return in 1 week for lab, flush, IV hydration -follow-up with Dr. Burr Medico prior to cycle 3  All questions were answered. The patient knows to call the clinic with any problems, questions or concerns. No barriers to learning was detected.  Alla Feeling, NP 06/23/17

## 2017-06-23 NOTE — Patient Instructions (Signed)
[facility] Discharge Instructions for Patients Receiving Chemotherapy  Today you received the following chemotherapy agents Adriamycin, Cytoxan.  To help prevent nausea and vomiting after your treatment, we encourage you to take your nausea medication as prescribed.   If you develop nausea and vomiting that is not controlled by your nausea medication, call the clinic.   BELOW ARE SYMPTOMS THAT SHOULD BE REPORTED IMMEDIATELY:  *FEVER GREATER THAN 100.5 F  *CHILLS WITH OR WITHOUT FEVER  NAUSEA AND VOMITING THAT IS NOT CONTROLLED WITH YOUR NAUSEA MEDICATION  *UNUSUAL SHORTNESS OF BREATH  *UNUSUAL BRUISING OR BLEEDING  TENDERNESS IN MOUTH AND THROAT WITH OR WITHOUT PRESENCE OF ULCERS  *URINARY PROBLEMS  *BOWEL PROBLEMS  UNUSUAL RASH Items with * indicate a potential emergency and should be followed up as soon as possible.  Feel free to call the clinic should you have any questions or concerns. The clinic phone number is [phone].  Please show the CHEMO ALERT CARD at check-in to the Emergency Department and triage nurse.   

## 2017-06-23 NOTE — Telephone Encounter (Signed)
Per 10/19 no los.

## 2017-06-28 ENCOUNTER — Encounter: Payer: Self-pay | Admitting: Physical Therapy

## 2017-06-28 ENCOUNTER — Ambulatory Visit: Payer: BLUE CROSS/BLUE SHIELD | Admitting: Physical Therapy

## 2017-06-28 DIAGNOSIS — M25611 Stiffness of right shoulder, not elsewhere classified: Secondary | ICD-10-CM

## 2017-06-28 DIAGNOSIS — M25612 Stiffness of left shoulder, not elsewhere classified: Secondary | ICD-10-CM | POA: Diagnosis not present

## 2017-06-28 DIAGNOSIS — M25511 Pain in right shoulder: Secondary | ICD-10-CM | POA: Diagnosis not present

## 2017-06-28 DIAGNOSIS — R293 Abnormal posture: Secondary | ICD-10-CM | POA: Diagnosis not present

## 2017-06-28 DIAGNOSIS — M25512 Pain in left shoulder: Secondary | ICD-10-CM

## 2017-06-28 DIAGNOSIS — Z483 Aftercare following surgery for neoplasm: Secondary | ICD-10-CM | POA: Diagnosis not present

## 2017-06-28 NOTE — Therapy (Signed)
[place], [place], [address] Phone: [phone]   Fax:  [phone]  Physical Therapy Treatment  Patient Details  Name: Madison Savage MRN: [id number] Date of Birth: 01/02/1971 Referring Provider: Dr. Burr Medico   Encounter Date: 06/28/2017      PT End of Session - 06/28/17 1154    Visit Number 7   Number of Visits 17   Date for PT Re-Evaluation 07/25/17   PT Start Time 1016   PT Stop Time 1107   PT Time Calculation (min) 51 min   Activity Tolerance Patient tolerated treatment well   Behavior During Therapy [facility] for tasks assessed/performed      Past Medical History:  Diagnosis Date  . Anemia   . Anxiety   . Cancer ([place]) 03/2017   left breast cancer  . Chronic ITP (idiopathic thrombocytopenia) (HCC)   . Family history of breast cancer   . Family history of ovarian cancer   . Headache    last migraine...9/24  . Heart murmur    at birth.  none now  . MTHFR mutation [facility])     Past Surgical History:  Procedure Laterality Date  . AXILLARY LYMPH NODE DISSECTION Left 04/27/2017   Procedure: AXILLARY LYMPH NODE DISSECTION;  Surgeon: [name], MD;  Location: [place];  Service: General;  Laterality: Left;  . BILATERAL TOTAL MASTECTOMY WITH AXILLARY LYMPH NODE DISSECTION Bilateral 04/27/2017   Procedure: SKIN SPARING MASTECTOMY;  Surgeon: [name], MD;  Location: [place];  Service: General;  Laterality: Bilateral;  . BREAST RECONSTRUCTION WITH PLACEMENT OF TISSUE EXPANDER AND FLEX HD (ACELLULAR HYDRATED DERMIS) Bilateral 04/27/2017   Procedure: BILATERAL BREAST RECONSTRUCTION WITH PLACEMENT OF TISSUE EXPANDER AND FLEX HD (ACELLULAR HYDRATED DERMIS);  Surgeon: Wallace Going, DO;  Location: [place];  Service: Plastics;  Laterality: Bilateral;  . BREAST SURGERY    . MASTECTOMY    . PORTACATH PLACEMENT Right 06/01/2017   Procedure:  INSERTION PORT-A-CATH RIGHT SUBCLAVIAN;  Surgeon: [name], MD;  Location: [place];  Service: General;  Laterality: Right;  . WISDOM TOOTH EXTRACTION      There were no vitals filed for this visit.      Subjective Assessment - 06/28/17 1021    Subjective I felt pretty good after last time. I woke up and feel more tight today. I was going to go in for another fill but they said they couldn't because I was so tight.    Pertinent History Patient was diagnosed on 03/21/17 with left invasive lobular carcinoma breast cancer. It measures 2.1 cm and is located in the upper outer quadrant. She also has 6 abnormal axillary lymph nodes and a node was biopsied and found to be positive for carcinoma.   04/27/2017 .bilateral mastecomy with ALND ( 21 nodes removed) with immedicate expanders. She is planning to have chemo, then have implant surgery and hysterectomy and then have radiation    Patient Stated Goals to get motion in arm back, return to exercise, and learn what to do to prevent lymphedema    Currently in Pain? No/denies   Pain Score 0-No pain                         [facility] Adult PT Treatment/Exercise - 06/28/17 0001      Shoulder Exercises: Standing   Other Standing Exercises yellow theraband around wrists with hands on wall retracting scapula by pulling  hand back off wall, alternating L and R x 10 reps each     Shoulder Exercises: Pulleys   Flexion 2 minutes   ABduction 2 minutes     Shoulder Exercises: ROM/Strengthening   Wall Wash 10 reps    Ball on Wall yellow ball up the wall x 10 reps    Other ROM/Strengthening Exercises UE ranger in standing in flexion and abduction     Shoulder Exercises: IT sales professional 5 reps   Other Shoulder Stretches "robber stretch"  with back agains wall x 10      Manual Therapy   Passive ROM PROM to left shoulder in direction of flexion, abduction providing pressure to pec to help release                   Short  Term Clinic Goals - 05/25/17 1735      CC Short Term Goal  #1   Title pt will be knowledgable of lymphedema risk reduction practices    Time 4   Period Weeks   Status New     CC Short Term Goal  #2   Title Pt will be independent in postural and shoulder stretching exercises   Time 4   Period Weeks   Status New           Long Term Clinic Goals - 05/25/17 1736      CC Long Term Goal  #1   Title Pt will have 140 degrees of left shoulder abduction so that she can received radiation treatment    Baseline 05/25/2017: 60 degrees    Time 8   Period Weeks   Status New     CC Long Term Goal  #2   Title Pt will ahve 150 degrees of right shoulder abduction so that she can perform household activities without difficulty    Baseline 05/25/2017: 95 degrees    Time 8   Period Weeks   Status New     CC Long Term Goal  #3   Title Pt will be independent in strength ABC program with knowledge of how to safely increase her intensity of exercise   Time 8   Period Weeks   Status New     CC Long Term Goal  #4   Title Pt will decrease Quick DASH score to < 20 indicating a function improvment of arms    Baseline 52.27 on 05/25/2017             Plan - 06/28/17 1154    Clinical Impression Statement Continued to work on scapular retraction to try and help decrease forward shoulder posture. Issued new scapular strengthening exercise today with band and instructed pt to do 10x/day at home. Did stretches with UE ranger and ended session with PROM to left shoulder with focus on increasing abduction.    Rehab Potential Good   Clinical Impairments Affecting Rehab Potential 21 nodes removed in left axilla    PT Frequency 2x / week   PT Duration 8 weeks   PT Treatment/Interventions Therapeutic exercise;Patient/family education;ADLs/Self Care Home Management;Taping;DME Instruction;Dry needling;Functional mobility training;Therapeutic activities;Manual lymph drainage;Manual techniques;Passive range of  motion   PT Next Visit Plan Cont with pulleys, ball on wall, wall, doorway and corner stretches.  AROM for scpuala especialy retraction and depression on left.  Continue with soft tissue work and myofascial release with PROM and joint mobilization as needed    Consulted and Agree with Plan of Care Patient  Patient will benefit from skilled therapeutic intervention in order to improve the following deficits and impairments:  Decreased range of motion, Impaired UE functional use, Pain, Decreased knowledge of precautions, Postural dysfunction, Decreased knowledge of use of DME, Decreased strength, Increased fascial restricitons, Impaired perceived functional ability  Visit Diagnosis: Stiffness of left shoulder, not elsewhere classified  Acute pain of left shoulder  Abnormal posture  Pain of right shoulder joint on movement  Stiffness of right shoulder, not elsewhere classified     Problem List Patient Active Problem List   Diagnosis Date Noted  . Breast cancer ([place]) 04/27/2017  . Genetic testing 04/17/2017  . BRCA2 gene mutation positive 04/17/2017  . Family history of breast cancer   . Family history of ovarian cancer   . Breast cancer metastasized to axillary lymph node, left ([place]) 03/29/2017  . Cough 03/10/2014  . Esophagitis 03/10/2014  . Chronic ITP (idiopathic thrombocytopenia) (HCC) 03/10/2014  . ALLERGIC REACTION, ACUTE 07/22/2008    Allyson Sabal [facility] 06/28/2017, 11:57 AM  [place], [place], [address] Phone: [phone]   Fax:  [phone]  Name: Madison Savage MRN: [id number] Date of Birth: 05-30-1971  Manus Gunning, PT 06/28/17 11:57 AM

## 2017-06-29 ENCOUNTER — Ambulatory Visit: Payer: BLUE CROSS/BLUE SHIELD

## 2017-06-30 ENCOUNTER — Ambulatory Visit: Payer: BLUE CROSS/BLUE SHIELD

## 2017-06-30 ENCOUNTER — Ambulatory Visit (HOSPITAL_BASED_OUTPATIENT_CLINIC_OR_DEPARTMENT_OTHER): Payer: BLUE CROSS/BLUE SHIELD | Admitting: Nurse Practitioner

## 2017-06-30 ENCOUNTER — Ambulatory Visit (HOSPITAL_BASED_OUTPATIENT_CLINIC_OR_DEPARTMENT_OTHER): Payer: BLUE CROSS/BLUE SHIELD

## 2017-06-30 ENCOUNTER — Other Ambulatory Visit (HOSPITAL_BASED_OUTPATIENT_CLINIC_OR_DEPARTMENT_OTHER): Payer: BLUE CROSS/BLUE SHIELD

## 2017-06-30 ENCOUNTER — Encounter: Payer: Self-pay | Admitting: *Deleted

## 2017-06-30 ENCOUNTER — Encounter: Payer: Self-pay | Admitting: Nurse Practitioner

## 2017-06-30 VITALS — BP 112/88 | HR 83 | Temp 98.1°F | Resp 20 | Ht 67.5 in | Wt 119.8 lb

## 2017-06-30 VITALS — BP 90/53 | HR 70

## 2017-06-30 DIAGNOSIS — Z170 Estrogen receptor positive status [ER+]: Secondary | ICD-10-CM | POA: Diagnosis not present

## 2017-06-30 DIAGNOSIS — C773 Secondary and unspecified malignant neoplasm of axilla and upper limb lymph nodes: Secondary | ICD-10-CM

## 2017-06-30 DIAGNOSIS — C50912 Malignant neoplasm of unspecified site of left female breast: Secondary | ICD-10-CM

## 2017-06-30 DIAGNOSIS — Z1509 Genetic susceptibility to other malignant neoplasm: Secondary | ICD-10-CM

## 2017-06-30 DIAGNOSIS — Z1501 Genetic susceptibility to malignant neoplasm of breast: Secondary | ICD-10-CM

## 2017-06-30 LAB — COMPREHENSIVE METABOLIC PANEL
ALT: 9 U/L (ref 0–55)
AST: 10 U/L (ref 5–34)
Albumin: 3.6 g/dL (ref 3.5–5.0)
Alkaline Phosphatase: 111 U/L (ref 40–150)
Anion Gap: 8 mEq/L (ref 3–11)
BUN: 14.7 mg/dL (ref 7.0–26.0)
CO2: 25 mEq/L (ref 22–29)
Calcium: 8.9 mg/dL (ref 8.4–10.4)
Chloride: 108 mEq/L (ref 98–109)
Creatinine: 0.7 mg/dL (ref 0.6–1.1)
EGFR: >60 mL/min/{1.73_m2} (ref >60)
Glucose: 105 mg/dl (ref 70–140)
Potassium: 3.5 mEq/L (ref 3.5–5.1)
Sodium: 141 mEq/L (ref 136–145)
Total Bilirubin: 0.24 mg/dL (ref 0.20–1.20)
Total Protein: 6.1 g/dL — ABNORMAL LOW (ref 6.4–8.3)

## 2017-06-30 LAB — CBC WITH DIFFERENTIAL/PLATELET
BASO%: 0.9 % (ref 0.0–2.0)
Basophils Absolute: 0 10*3/uL (ref 0.0–0.1)
EOS%: 0.7 % (ref 0.0–7.0)
Eosinophils Absolute: 0 10*3/uL (ref 0.0–0.5)
HCT: 35.4 % (ref 34.8–46.6)
HGB: 11.8 g/dL (ref 11.6–15.9)
LYMPH%: 20 % (ref 14.0–49.7)
MCH: 30.2 pg (ref 25.1–34.0)
MCHC: 33.3 g/dL (ref 31.5–36.0)
MCV: 90.5 fL (ref 79.5–101.0)
MONO#: 0.5 10*3/uL (ref 0.1–0.9)
MONO%: 10.1 % (ref 0.0–14.0)
NEUT#: 3.1 10*3/uL (ref 1.5–6.5)
NEUT%: 68.3 % (ref 38.4–76.8)
Platelets: 182 10*3/uL (ref 145–400)
RBC: 3.91 10*6/uL (ref 3.70–5.45)
RDW: 13.2 % (ref 11.2–14.5)
WBC: 4.6 10*3/uL (ref 3.9–10.3)
lymph#: 0.9 10*3/uL (ref 0.9–3.3)

## 2017-06-30 MED ORDER — SODIUM CHLORIDE 0.9% FLUSH
10.0000 mL | INTRAVENOUS | Status: DC | PRN
  Administered 2017-06-30: 10 mL via INTRAVENOUS
  Filled 2017-06-30: qty 10

## 2017-06-30 MED ORDER — HEPARIN SOD (PORK) LOCK FLUSH 100 UNIT/ML IV SOLN
500.0000 [IU] | Freq: Once | INTRAVENOUS | Status: AC
  Administered 2017-06-30: 500 [IU] via INTRAVENOUS
  Filled 2017-06-30: qty 5

## 2017-06-30 MED ORDER — SODIUM CHLORIDE 0.9 % IV SOLN
Freq: Once | INTRAVENOUS | Status: AC
  Administered 2017-06-30: 11:00:00 via INTRAVENOUS

## 2017-06-30 NOTE — Progress Notes (Signed)
[place]  [phone] Fax:([phone]) [phone]  Clinic Follow up Note   Patient Care Team: Dorena Cookey, MD as PCP - [name], MD as Consulting Physician (General Surgery) [name], MD as Consulting Physician (Hematology) Kyung Rudd, MD as Consulting Physician (Radiation Oncology) Brien Few, MD as Consulting Physician (Obstetrics and Gynecology) 06/30/2017  SUMMARY OF ONCOLOGIC HISTORY: Oncology History   Cancer Staging Breast cancer metastasized to axillary lymph node, left [facility]) Staging form: Breast, AJCC 8th Edition - Clinical stage from 03/22/2017: Stage Unknown (cTX, cN1, cM0, GX, ER: Positive, PR: Positive, HER2: Negative) - Signed by [name], MD on 03/29/2017 \     Breast cancer metastasized to axillary lymph node, left ([place])   03/21/2017 Mammogram    Diagnostic mammogram bilateral 03/21/17 IMPRESSION: INCOMPLETE - ADDITIONAL IMAGING EVALUATION NEEDED 1. The 0.6 cm oval mass in the right breast at 11 o'clock middle depth is indeterminate. An ultrasound is recommended.  2. The 0.5cm round focal asymmetry in the left breast central to the nipple middle depth is indeterminate. An ultrasound is recommended.  3. Ultrasound of the palpable abnormalities in the axilla and far left lateral breast is recommended.  4. Multiple clusters of pleomorphic calcifications in the left upper outer breast anterior depth spanning a 2.5 cm area are highly suggestive of malignancy. A stereotactic biopsy is recommended.        03/21/2017 Imaging    US Breast bilateral 03/21/17 IMPRESSION: HIGHLY SUGGESTIVE OF MALIGNANCY 1. Six distinct abnormal left axillary nodes, 2 of which correspond to the palpable lumps felt by the patient. Findings concerning for metastatic adenopathy. Ultrasound guided biopsy of one of these nodes is recommended.  2. Small 5 mm palpable, oval mass in the left breast 3:00 areolar margin is at an intermediate suspicion.  Ultrasound guided biopsy is recommended.  3. Isoechoic 8 mm mass in the right breast 10:00 5 cm from the nipple is also an intermediate suspicion for malignancy, and ultrasound guided biopsy is recommended.  4. Stereotactic guided biopsy for the highly suspicious calcifications in the left breast also recommended as detailed in the mammography report.        03/22/2017 Initial Biopsy    Diagnosis 03/22/17 Lymph node, needle/core biopsy, left axillary node -METASTATIC CARCINOMA, SEE COMMENT Microscopic Comment      03/22/2017 Receptors her2    Lymph node biopsy ER 95% positive, strong staining, PR 5% positive, weak staining, HER-2 negative, with HER2/CEP17 ratio 1.23 and copy #3.95.      03/27/2017 Pathology Results    Diagnosis 03/27/17 1. Breast, left, needle core biopsy -FIBROADENOMATOID NODULE WITH CALCIFICATIONS 2.Breast, left, needle core biopsy -DUCTAL CARCINOMA IN SITU, HIGH GRADE -SEE COMMENT  3. Breast, right, needle, core biopsy -FIBROADENOMA -NO MALIGNANCY IDENTIFIED       03/29/2017 Initial Diagnosis    Breast cancer metastasized to axillary lymph node, left ([place])     04/07/2017 Imaging    CT CAP 04/07/17 IMPRESSION: 1. Several prominent left axillary lymph nodes which may correspond to biopsy proven axillary lymph node metastasis. 2. No thoracic adenopathy identified or evidence of distant metastatic disease.      04/07/2017 Imaging    Bone whole body scan 04/07/17 IMPRESSION: Negative for evidence of osseous metastatic disease.      04/17/2017 Genetic Testing    BRCA2 c.[id number] (p.Glu260Serfs*15) pathogenic mutation identified on the 9 gene STAT panel.  The STAT Breast cancer panel offered by [facility] includes sequencing and rearrangement analysis for the following 9  genes:  ATM, BRCA1, BRCA2, CDH1, CHEK2, PALB2, PTEN, STK11 and TP53.   The report date is April 17, 2017.  UPDATE: BRCA2 c.778_779delGA (p.Glu260Serfs*15) pathogenic mutation and NF1 c.1166A>G  (p.His389Arg) VUS identified on the common hereditary cancer panel.  The Hereditary Gene Panel offered by [facility] includes sequencing and/or deletion duplication testing of the following 46 genes: APC, ATM, AXIN2, BARD1, BMPR1A, BRCA1, BRCA2, BRIP1, CDH1, CDKN2A (p14ARF), CDKN2A (p16INK4a), CHEK2, CTNNA1, DICER1, EPCAM (Deletion/duplication testing only), GREM1 (promoter region deletion/duplication testing only), KIT, MEN1, MLH1, MSH2, MSH3, MSH6, MUTYH, NBN, NF1, NHTL1, PALB2, PDGFRA, PMS2, POLD1, POLE, PTEN, RAD50, RAD51C, RAD51D, SDHB, SDHC, SDHD, SMAD4, SMARCA4. STK11, TP53, TSC1, TSC2, and VHL.  The following genes were evaluated for sequence changes only: SDHA and HOXB13 c.251G>A variant only.  The report date is April 29, 2017.       04/27/2017 Surgery    Surgey 04/27/17 BILATERAL SKIN SPARING MASTECTOMY and LEFT AXILLARY LYMPH NODE and BILATERAL BREAST RECONSTRUCTION WITH PLACEMENT OF TISSUE EXPANDER AND FLEX HD (ACELLULAR HYDRATED DERMIS) Bilateral BY Dr. Barry Dienes and Dr. Marla Roe       04/27/2017 Pathology Results     Diagnosis  04/27/17 1. Breast, simple mastectomy, Right - FIBROCYSTIC CHANGES WITH ADENOSIS. - USUAL DUCTAL HYPERPLASIA. - HEALING BIOPSY SITE. - THERE IS NO EVIDENCE OF MALIGNANCY. 2. Breast, simple mastectomy, Left - INVASIVE DUCTAL CARCINOMA, GRADE II/III, SPANNING 1.2 CM. - DUCTAL CARCINOMA IN SITU WITH CALCIFICATIONS, HIGH GRADE. - INVASIVE DUCTAL CARCINOMA IS FOCALLY PRESENT AT THE ANTERIOR MARGIN. - DUCTAL CARCINOMA IN SITU IS BROADLY 0.1 CM TO THE POSTERIOR MARGIN. - SEE ONCOLOGY TABLE BELOW. 3. Lymph nodes, regional resection, Left axillary - METASTATIC CARCINOMA IN 8 OF 21 LYMPH NODES (8/21). - SEE COMMENT     CURRENT THERAPY:Dose dense Adriamycin/Cytoxan with onPro neulasta support q2 weeks x4 cycles beginning 06/09/17   INTERVAL HISTORY: Mrs. Boulos returns for follow-up as scheduled for IVF. She completed second cycle AC with Onpro 1 week ago. She  tolerated this cycle very well, she was mildly fatigued on today's 3 through 5 after chemotherapy but able to exercise regularly and complete a large project at her home. She had one episode of nausea after drinking coffee that is resolved today, she denies vomiting, constipation, or diarrhea. She took 2 doses of tramadol for bone pain with Neulasta which helped her very much. She feels chest skin tightness around her Port-A-Cath and bilateral breast reconstruction site 3 days after chemotherapy. She was not able to do last tissue expander filling this week. She continues physical therapy and lymph clinic twice per week. She feels a small "knot" around her Port-A-Cath incision site that is slightly red, denies pain, drainage, swelling, fever, or chills. She continues to cold cap with chemotherapy, has some shedding but otherwise appears to be preserving her hair.   REVIEW OF SYSTEMS:   Constitutional: Denies  fevers, chills or abnormal weight loss. Good appetite. (+) mild fatigue days 3-5 following cycle 2, resolved today Eyes: Denies blurriness of vision Ears, nose, mouth, throat, and face: Denies mucositis or sore throat Respiratory: Denies cough, dyspnea or wheezes Cardiovascular: Denies palpitation, chest discomfort or lower extremity swelling Gastrointestinal:  Denies vomiting, constipation, diarrhea, heartburn or change in bowel habits (+) nausea x1 after drinking coffee, resolved today Skin: Denies abnormal skin rashes. (+) small round "knot" at [facility] incision, not painful or swollen, no drainage. Lymphatics: Denies new lymphadenopathy or easy bruising Neurological:Denies numbness, tingling or new weaknesses Behavioral/Psych: Mood is stable, no new changes  All other systems were reviewed with the patient and are negative.  MEDICAL HISTORY:  Past Medical History:  Diagnosis Date  . Anemia   . Anxiety   . Cancer ([place]) 03/2017   left breast cancer  . Chronic ITP (idiopathic thrombocytopenia)  ([facility])   . Family history of breast cancer   . Family history of ovarian cancer   . Headache    last migraine...9/24  . Heart murmur    at birth.  none now  . MTHFR mutation [facility])     SURGICAL HISTORY: Past Surgical History:  Procedure Laterality Date  . AXILLARY LYMPH NODE DISSECTION Left 04/27/2017   Procedure: AXILLARY LYMPH NODE DISSECTION;  Surgeon: [name], MD;  Location: [place];  Service: General;  Laterality: Left;  . BILATERAL TOTAL MASTECTOMY WITH AXILLARY LYMPH NODE DISSECTION Bilateral 04/27/2017   Procedure: SKIN SPARING MASTECTOMY;  Surgeon: [name], MD;  Location: [place];  Service: General;  Laterality: Bilateral;  . BREAST RECONSTRUCTION WITH PLACEMENT OF TISSUE EXPANDER AND FLEX HD (ACELLULAR HYDRATED DERMIS) Bilateral 04/27/2017   Procedure: BILATERAL BREAST RECONSTRUCTION WITH PLACEMENT OF TISSUE EXPANDER AND FLEX HD (ACELLULAR HYDRATED DERMIS);  Surgeon: Wallace Going, DO;  Location: [place];  Service: Plastics;  Laterality: Bilateral;  . BREAST SURGERY    . MASTECTOMY    . PORTACATH PLACEMENT Right 06/01/2017   Procedure: INSERTION PORT-A-CATH RIGHT SUBCLAVIAN;  Surgeon: [name], MD;  Location: [place];  Service: General;  Laterality: Right;  . WISDOM TOOTH EXTRACTION      I have reviewed the social history and family history with the patient and they are unchanged from previous note.  ALLERGIES:  is allergic to clindamycin; penicillins; and codeine.  MEDICATIONS:  Current Outpatient Prescriptions  Medication Sig Dispense Refill  . Cyanocobalamin (VITAMIN B-12 PO) Take 1 tablet by mouth daily.     . diazepam (VALIUM) 2 MG tablet Take 1 tablet (2 mg total) by mouth every 6 (six) hours as needed for anxiety or muscle spasms. 30 tablet 0  . HYDROcodone-acetaminophen (NORCO/VICODIN) 5-325 MG tablet Take 1 tablet by mouth every 4 (four) hours as needed for moderate pain. 30 tablet 0  .  lidocaine-prilocaine (EMLA) cream Apply to affected area once 30 g 3  . Multiple Vitamins-Calcium (ONE-A-DAY WOMENS PO) Take 1 tablet by mouth daily.    . ondansetron (ZOFRAN) 8 MG tablet Take 1 tablet (8 mg total) by mouth 2 (two) times daily as needed. Start on the third day after chemotherapy. 30 tablet 1  . OVER THE COUNTER MEDICATION Take 1 capsule by mouth daily. Biotin/MSM    . prochlorperazine (COMPAZINE) 10 MG tablet Take 1 tablet (10 mg total) by mouth every 6 (six) hours as needed (Nausea or vomiting). 30 tablet 1  . traMADol (ULTRAM) 50 MG tablet Take 1 tablet (50 mg total) by mouth every 6 (six) hours as needed. 15 tablet 1  . vitamin C (ASCORBIC ACID) 500 MG tablet Take 2,000 mg by mouth daily.      Current Facility-Administered Medications  Medication Dose Route Frequency Provider Last Rate Last Dose  . LORazepam (ATIVAN) injection 0.5 mg  0.5 mg Intravenous Once [name], MD        PHYSICAL EXAMINATION: ECOG PERFORMANCE STATUS: 1 - Symptomatic but completely ambulatory BP 112/88 (BP Location: Left Arm, Patient Position: Sitting)   Pulse 83   Temp 98.1 F (36.7 C) (Oral)   Resp 20   Ht 5' 7.5" (  1.715 m)   Wt 119 lb 12.8 oz (54.3 kg)   SpO2 100%   BMI 18.49 kg/m   GENERAL:alert, no distress and comfortable SKIN: skin color, texture, turgor are normal, no rashes.  EYES: normal, Conjunctiva are pink and non-injected, sclera clear OROPHARYNX:no exudate, no erythema and lips, buccal mucosa, and tongue normal  NECK: supple, thyroid normal size, non-tender, without nodularity LYMPH:  no palpable cervical, supraclavicular, or axillary lymphadenopathy LUNGS: clear to auscultation bilaterally with normal breathing effort HEART: regular rate & rhythm and no murmurs and no lower extremity edema ABDOMEN:abdomen soft, non-tender and normal bowel sounds Musculoskeletal:no cyanosis of digits and no clubbing  NEURO: alert & oriented x 3 with fluent speech, no focal motor/sensory  deficits Breast: s/p bilateral reconstruction with immediate reconstruction with tissue expanders; incisions are well healed, no drainage or erythema; skin is very thin and tight. Left axillary incision is well healed. PAC is accessed with slight erythema, nontender, no drainage or edema. Small round 3 mm nodule at incision site with central darkening, obscured due to dressing film.  LABORATORY DATA:  I have reviewed the data as listed CBC Latest Ref Rng & Units 06/30/2017 06/23/2017 06/16/2017  WBC 3.9 - 10.3 10e3/uL 4.6 6.3 5.0  Hemoglobin 11.6 - 15.9 g/dL 11.8 13.3 12.5  Hematocrit 34.8 - 46.6 % 35.4 40.5 36.8  Platelets 145 - 400 10e3/uL 182 188 115(L)     CMP Latest Ref Rng & Units 06/23/2017 06/16/2017 06/09/2017  Glucose 70 - 140 mg/dl 134 84 89  BUN 7.0 - 26.0 mg/dL 15.3 12.5 14.3  Creatinine 0.6 - 1.1 mg/dL 0.8 0.7 0.8  Sodium 136 - 145 mEq/L 139 141 139  Potassium 3.5 - 5.1 mEq/L 4.1 3.6 4.2  Chloride 101 - 111 mmol/L - - -  CO2 22 - 29 mEq/L '26 24 26  '$ Calcium 8.4 - 10.4 mg/dL 9.0 8.3(L) 9.5  Total Protein 6.4 - 8.3 g/dL 6.2(L) 5.7(L) 6.6  Total Bilirubin 0.20 - 1.20 mg/dL <0.22 0.25 0.38  Alkaline Phos 40 - 150 U/L 87 87 70  AST 5 - 34 U/L '11 9 14  '$ ALT 0 - 55 U/L '10 9 9     '$ RADIOGRAPHIC STUDIES: I have personally reviewed the radiological images as listed and agreed with the findings in the report. No results found.   ASSESSMENT & PLAN: 46 year old perimenopausal female with a history of chronic ITP and MTHFR mutation, presented with a palpable left axillary adenopathy.   1. 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 Chronic ITP 3. BRCA2 mutation positive  Ms. Muratore appears stable today, she tolerated cycle 2 AC very well. She had mild fatigue and nausea that are resolved today. Her PAC site was assessed by Dr. Burr Medico, it does not appear infected and may be a retained suture.  It is nontender without drainage or surrounding edema. We will monitor closely. She is afebrile, CBC and Cmet unremarkable, VSS and weight stable. She will get scheduled IVF today and return in 1 week for cycle 3 AC. She prefers to continue getting IVF on her off week after each cycle. We reviewed she will begin weekly taxol/carbo after completion of cycle 4 AC. She will return to plastic surgery next week to determine whether she will get last filling, she is fine if that does not happen, happy with breast size at this point. We will see her in 1 week for f/u with Dr. Burr Medico  and cycle 3.  PLAN: -proceed with IVF today -return 1 week for f/u with Dr. Burr Medico and cycle 3  All questions were answered. The patient knows to call the clinic with any problems, questions or concerns. No barriers to learning was detected.     Alla Feeling, NP 06/30/17

## 2017-06-30 NOTE — Patient Instructions (Signed)
Dehydration, Adult Dehydration is a condition in which there is not enough fluid or water in the body. This happens when you lose more fluids than you take in. Important organs, such as the kidneys, brain, and heart, cannot function without a proper amount of fluids. Any loss of fluids from the body can lead to dehydration. Dehydration can range from mild to severe. This condition should be treated right away to prevent it from becoming severe. What are the causes? This condition may be caused by:  Vomiting.  Diarrhea.  Excessive sweating, such as from heat exposure or exercise.  Not drinking enough fluid, especially: ? When ill. ? While doing activity that requires a lot of energy.  Excessive urination.  Fever.  Infection.  Certain medicines, such as medicines that cause the body to lose excess fluid (diuretics).  Inability to access safe drinking water.  Reduced physical ability to get adequate water and food.  What increases the risk? This condition is more likely to develop in people:  Who have a poorly controlled long-term (chronic) illness, such as diabetes, heart disease, or kidney disease.  Who are age 65 or older.  Who are disabled.  Who live in a place with high altitude.  Who play endurance sports.  What are the signs or symptoms? Symptoms of mild dehydration may include:  Thirst.  Dry lips.  Slightly dry mouth.  Dry, warm skin.  Dizziness. Symptoms of moderate dehydration may include:  Very dry mouth.  Muscle cramps.  Dark urine. Urine may be the color of tea.  Decreased urine production.  Decreased tear production.  Heartbeat that is irregular or faster than normal (palpitations).  Headache.  Light-headedness, especially when you stand up from a sitting position.  Fainting (syncope). Symptoms of severe dehydration may include:  Changes in skin, such as: ? Cold and clammy skin. ? Blotchy (mottled) or pale skin. ? Skin that does  not quickly return to normal after being lightly pinched and released (poor skin turgor).  Changes in body fluids, such as: ? Extreme thirst. ? No tear production. ? Inability to sweat when body temperature is high, such as in hot weather. ? Very little urine production.  Changes in vital signs, such as: ? Weak pulse. ? Pulse that is more than 100 beats a minute when sitting still. ? Rapid breathing. ? Low blood pressure.  Other changes, such as: ? Sunken eyes. ? Cold hands and feet. ? Confusion. ? Lack of energy (lethargy). ? Difficulty waking up from sleep. ? Short-term weight loss. ? Unconsciousness. How is this diagnosed? This condition is diagnosed based on your symptoms and a physical exam. Blood and urine tests may be done to help confirm the diagnosis. How is this treated? Treatment for this condition depends on the severity. Mild or moderate dehydration can often be treated at home. Treatment should be started right away. Do not wait until dehydration becomes severe. Severe dehydration is an emergency and it needs to be treated in a hospital. Treatment for mild dehydration may include:  Drinking more fluids.  Replacing salts and minerals in your blood (electrolytes) that you may have lost. Treatment for moderate dehydration may include:  Drinking an oral rehydration solution (ORS). This is a drink that helps you replace fluids and electrolytes (rehydrate). It can be found at pharmacies and retail stores. Treatment for severe dehydration may include:  Receiving fluids through an IV tube.  Receiving an electrolyte solution through a feeding tube that is passed through your nose   and into your stomach (nasogastric tube, or NG tube).  Correcting any abnormalities in electrolytes.  Treating the underlying cause of dehydration. Follow these instructions at home:  If directed by your health care provider, drink an ORS: ? Make an ORS by following instructions on the  package. ? Start by drinking small amounts, about  cup (120 mL) every 5-10 minutes. ? Slowly increase how much you drink until you have taken the amount recommended by your health care provider.  Drink enough clear fluid to keep your urine clear or pale yellow. If you were told to drink an ORS, finish the ORS first, then start slowly drinking other clear fluids. Drink fluids such as: ? Water. Do not drink only water. Doing that can lead to having too little salt (sodium) in the body (hyponatremia). ? Ice chips. ? Fruit juice that you have added water to (diluted fruit juice). ? Low-calorie sports drinks.  Avoid: ? Alcohol. ? Drinks that contain a lot of sugar. These include high-calorie sports drinks, fruit juice that is not diluted, and soda. ? Caffeine. ? Foods that are greasy or contain a lot of fat or sugar.  Take over-the-counter and prescription medicines only as told by your health care provider.  Do not take sodium tablets. This can lead to having too much sodium in the body (hypernatremia).  Eat foods that contain a healthy balance of electrolytes, such as bananas, oranges, potatoes, tomatoes, and spinach.  Keep all follow-up visits as told by your health care provider. This is important. Contact a health care provider if:  You have abdominal pain that: ? Gets worse. ? Stays in one area (localizes).  You have a rash.  You have a stiff neck.  You are more irritable than usual.  You are sleepier or more difficult to wake up than usual.  You feel weak or dizzy.  You feel very thirsty.  You have urinated only a small amount of very dark urine over 6-8 hours. Get help right away if:  You have symptoms of severe dehydration.  You cannot drink fluids without vomiting.  Your symptoms get worse with treatment.  You have a fever.  You have a severe headache.  You have vomiting or diarrhea that: ? Gets worse. ? Does not go away.  You have blood or green matter  (bile) in your vomit.  You have blood in your stool. This may cause stool to look black and tarry.  You have not urinated in 6-8 hours.  You faint.  Your heart rate while sitting still is over 100 beats a minute.  You have trouble breathing. This information is not intended to replace advice given to you by your health care provider. Make sure you discuss any questions you have with your health care provider. Document Released: 08/22/2005 Document Revised: 03/18/2016 Document Reviewed: 10/16/2015 Elsevier Interactive Patient Education  2018 [employer]  

## 2017-07-04 NOTE — Progress Notes (Signed)
[place]  [phone] [phone]  Clinic Follow up Note   Patient Care Team: Dorena Cookey, MD as PCP - Madison Citizen, MD as Consulting Physician (General Surgery) [name], MD as Consulting Physician (Hematology) Kyung Rudd, MD as Consulting Physician (Radiation Oncology) Brien Few, MD as Consulting Physician (Obstetrics and Gynecology) 07/07/2017  CHIEF COMPLAINTS:  Follow up for Breast cancer metastasized to axillary lymph node, left   Oncology History   Cancer Staging Breast cancer metastasized to axillary lymph node, left [facility]) Staging form: Breast, AJCC 8th Edition - Clinical stage from 03/22/2017: Stage Unknown (cTX, cN1, cM0, GX, ER: Positive, PR: Positive, HER2: Negative) - Signed by [name], MD on 03/29/2017 - Pathologic stage from 04/27/2017: Stage IB (pT1c, pN2a, cM0, G2, ER: Positive, PR: Positive, HER2: Negative) - Signed by [name], MD on 05/25/2017 \     Breast cancer metastasized to axillary lymph node, left ([place])   03/21/2017 Mammogram    Diagnostic mammogram bilateral 03/21/17 IMPRESSION: INCOMPLETE - ADDITIONAL IMAGING EVALUATION NEEDED 1. The 0.6 cm oval mass in the right breast at 11 o'clock middle depth is indeterminate. An ultrasound is recommended.  2. The 0.5cm round focal asymmetry in the left breast central to the nipple middle depth is indeterminate. An ultrasound is recommended.  3. Ultrasound of the palpable abnormalities in the axilla and far left lateral breast is recommended.  4. Multiple clusters of pleomorphic calcifications in the left upper outer breast anterior depth spanning a 2.5 cm area are highly suggestive of malignancy. A stereotactic biopsy is recommended.        03/21/2017 Imaging    US Breast bilateral 03/21/17 IMPRESSION: HIGHLY SUGGESTIVE OF MALIGNANCY 1. Six distinct abnormal left axillary nodes, 2 of which correspond to the palpable lumps felt by the patient. Findings  concerning for metastatic adenopathy. Ultrasound guided biopsy of one of these nodes is recommended.  2. Small 5 mm palpable, oval mass in the left breast 3:00 areolar margin is at an intermediate suspicion. Ultrasound guided biopsy is recommended.  3. Isoechoic 8 mm mass in the right breast 10:00 5 cm from the nipple is also an intermediate suspicion for malignancy, and ultrasound guided biopsy is recommended.  4. Stereotactic guided biopsy for the highly suspicious calcifications in the left breast also recommended as detailed in the mammography report.        03/22/2017 Initial Biopsy    Diagnosis 03/22/17 Lymph node, needle/core biopsy, left axillary node -METASTATIC CARCINOMA, SEE COMMENT Microscopic Comment      03/22/2017 Receptors her2    Lymph node biopsy ER 95% positive, strong staining, PR 5% positive, weak staining, HER-2 negative, with HER2/CEP17 ratio 1.23 and copy #3.95.      03/27/2017 Pathology Results    Diagnosis 03/27/17 1. Breast, left, needle core biopsy -FIBROADENOMATOID NODULE WITH CALCIFICATIONS 2.Breast, left, needle core biopsy -DUCTAL CARCINOMA IN SITU, HIGH GRADE -SEE COMMENT  3. Breast, right, needle, core biopsy -FIBROADENOMA -NO MALIGNANCY IDENTIFIED       03/29/2017 Initial Diagnosis    Breast cancer metastasized to axillary lymph node, left ([place])     04/07/2017 Imaging    CT CAP 04/07/17 IMPRESSION: 1. Several prominent left axillary lymph nodes which may correspond to biopsy proven axillary lymph node metastasis. 2. No thoracic adenopathy identified or evidence of distant metastatic disease.      04/07/2017 Imaging    Bone whole body scan 04/07/17 IMPRESSION: Negative for evidence of osseous metastatic disease.  04/17/2017 Genetic Testing    BRCA2 c.[id number] (p.Glu260Serfs*15) pathogenic mutation identified on the 9 gene STAT panel.  The STAT Breast cancer panel offered by [facility] includes sequencing and rearrangement analysis for the  following 9 genes:  ATM, BRCA1, BRCA2, CDH1, CHEK2, PALB2, PTEN, STK11 and TP53.   The report date is April 17, 2017.  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  The Hereditary Gene Panel offered by [facility] includes sequencing and/or deletion duplication testing of the following 46 genes: APC, ATM, AXIN2, BARD1, BMPR1A, BRCA1, BRCA2, BRIP1, CDH1, CDKN2A (p14ARF), CDKN2A (p16INK4a), CHEK2, CTNNA1, DICER1, EPCAM (Deletion/duplication testing only), GREM1 (promoter region deletion/duplication testing only), KIT, MEN1, MLH1, MSH2, MSH3, MSH6, MUTYH, NBN, NF1, NHTL1, PALB2, PDGFRA, PMS2, POLD1, POLE, PTEN, RAD50, RAD51C, RAD51D, SDHB, SDHC, SDHD, SMAD4, SMARCA4. STK11, TP53, TSC1, TSC2, and VHL.  The following genes were evaluated for sequence changes only: SDHA and HOXB13 c.251G>A variant only.  The report date is April 29, 2017.       04/27/2017 Surgery    Surgey 04/27/17 BILATERAL SKIN SPARING MASTECTOMY and LEFT AXILLARY LYMPH NODE and BILATERAL BREAST RECONSTRUCTION WITH PLACEMENT OF TISSUE EXPANDER AND FLEX HD (ACELLULAR HYDRATED DERMIS) Bilateral BY Dr. Barry Dienes and Dr. Marla Roe       04/27/2017 Pathology Results     Diagnosis  04/27/17 1. Breast, simple mastectomy, Right - FIBROCYSTIC CHANGES WITH ADENOSIS. - USUAL DUCTAL HYPERPLASIA. - HEALING BIOPSY SITE. - THERE IS NO EVIDENCE OF MALIGNANCY. 2. Breast, simple mastectomy, Left - INVASIVE DUCTAL CARCINOMA, GRADE II/III, SPANNING 1.2 CM. - DUCTAL CARCINOMA IN SITU WITH CALCIFICATIONS, HIGH GRADE. - INVASIVE DUCTAL CARCINOMA IS FOCALLY PRESENT AT THE ANTERIOR MARGIN. - DUCTAL CARCINOMA IN SITU IS BROADLY 0.1 CM TO THE POSTERIOR MARGIN. - SEE ONCOLOGY TABLE BELOW. 3. Lymph nodes, regional resection, Left axillary - METASTATIC CARCINOMA IN 8 OF 21 LYMPH NODES (8/21). - SEE COMMENT      06/09/2017 -  Chemotherapy    Adriamycin  and Cytoxan every 2 weeks, for 4 cycles starting 06/09/17, followed by weekly Taxol and carboplatin for 12 weeks.        HISTORY OF PRESENTING ILLNESS: 03/29/17 Madison Savage 46 y.o. female is here because of newly diagnosed Breast cancer metastasized to axillary lymph node, left. She presents to Breast Clinic today with her husband. She felt the lump herself June 22nd. It was soft and "squishy" and it moved. First in the left breast and then four days latter in the left axilla. She feels they have gotten smaller.   In the past she was diagnosed with being ITP positive. Period has been irregular more since her last miscarriage in 2015. She has MTHFR gene. In pregnancy her plt counts have gone low. She has not seen homologist for this. All 3 children were vaginal births.  Today she reports that with the lumps she feels a dull ache that comes and goes. She has not noticed any nipple or skin change. She had not had a mammogram since she was 15. Her PCP was going to set her up with a mammogram last year. Dr. Ronita Hipps is her OB and Dr. Sherren Mocha is her current PCP. She feels no change in appetite or weight loss. She quit smoking 3 weeks ago. She works out Scientist, product/process development. She reports her last attempt at the MRI, the contrast made her feel like she could not breathe, her head was pounding and she felt hot all over and she  was shaking. The trouble breathing only lasted while she was in the MRI.  Husband shared concern with insurance and financial. And referrals to second opinions.    GYN HISTORY  Menarchal: 16 LMP: 03/05/2017 Contraceptive: none HRT: N/A G7P3: - 3 miscarriages, 1 abortion    CURRENT THERAPY: Adriamycin and Cytoxan every 2 weeks, for 4 cycles starting 06/09/17, followed by weekly Taxol and carboplatin for 12 weeks starting 08/04/17  INTERVAL HISTORY:  KARLY PITTER is here for a follow up and cycle 3 AC. She presents to the clinic today noting her weight fluctuates 114-116. She feels she has  gained some weight. She has been using her cold cap and she changed about 10 times a day. She is ready to continue treatment. She would like to continue with IV Fluids. She went to PT and her ROM is now better than before surgery. She is working out more along with her PT. She feels under her right axilla a possible ingrown hair. She notes her loss of body hair and thinning of her eyebrows but has retained her hair on her scalp with the help of her cold cap.     MEDICAL HISTORY:  Past Medical History:  Diagnosis Date  . Anemia   . Anxiety   . Cancer ([place]) 03/2017   left breast cancer  . Chronic ITP (idiopathic thrombocytopenia) ([facility])   . Family history of breast cancer   . Family history of ovarian cancer   . Headache    last migraine...9/24  . Heart murmur    at birth.  none now  . MTHFR mutation [facility])     SURGICAL HISTORY: Past Surgical History:  Procedure Laterality Date  . AXILLARY LYMPH NODE DISSECTION Left 04/27/2017   Procedure: AXILLARY LYMPH NODE DISSECTION;  Surgeon: [name], MD;  Location: [place];  Service: General;  Laterality: Left;  . BILATERAL TOTAL MASTECTOMY WITH AXILLARY LYMPH NODE DISSECTION Bilateral 04/27/2017   Procedure: SKIN SPARING MASTECTOMY;  Surgeon: [name], MD;  Location: [place];  Service: General;  Laterality: Bilateral;  . BREAST RECONSTRUCTION WITH PLACEMENT OF TISSUE EXPANDER AND FLEX HD (ACELLULAR HYDRATED DERMIS) Bilateral 04/27/2017   Procedure: BILATERAL BREAST RECONSTRUCTION WITH PLACEMENT OF TISSUE EXPANDER AND FLEX HD (ACELLULAR HYDRATED DERMIS);  Surgeon: Wallace Going, DO;  Location: [place];  Service: Plastics;  Laterality: Bilateral;  . BREAST SURGERY    . MASTECTOMY    . PORTACATH PLACEMENT Right 06/01/2017   Procedure: INSERTION PORT-A-CATH RIGHT SUBCLAVIAN;  Surgeon: [name], MD;  Location: [place];  Service: General;  Laterality: Right;  . WISDOM TOOTH  EXTRACTION      SOCIAL HISTORY: Social History   Social History  . Marital status: Married    Spouse name: N/A  . Number of children: 3  . Years of education: N/A   Occupational History  .  Unemployed   Social History Main Topics  . Smoking status: Former Smoker    Packs/day: 0.50    Years: 20.00    Types: Cigarettes    Quit date: 03/05/2017  . Smokeless tobacco: Never Used  . Alcohol use No  . Drug use: No  . Sexual activity: Yes    Partners: Male    Birth control/ protection: None   Other Topics Concern  . Not on file   Social History Narrative   Stay at home mom   3 children ages 12, 45, 49    FAMILY HISTORY: Family History  Problem Relation Age of Onset  . Breast cancer Paternal Aunt 33  . Ovarian cancer Paternal Aunt 59  . Cancer Paternal Grandmother        breast cancer   . Cancer Maternal Aunt        lymphoma   . Cancer Paternal Grandfather        throat cancer   . Cancer Paternal Aunt        lung cancer  . Cirrhosis Father   . AAA (abdominal aortic aneurysm) Maternal Grandfather   . Leukemia Paternal Aunt     ALLERGIES:  is allergic to clindamycin; penicillins; and codeine.  MEDICATIONS:  Current Outpatient Prescriptions  Medication Sig Dispense Refill  . Cyanocobalamin (VITAMIN B-12 PO) Take 1 tablet by mouth daily.     . diazepam (VALIUM) 2 MG tablet Take 1 tablet (2 mg total) by mouth every 6 (six) hours as needed for anxiety or muscle spasms. 30 tablet 0  . HYDROcodone-acetaminophen (NORCO/VICODIN) 5-325 MG tablet Take 1 tablet by mouth every 4 (four) hours as needed for moderate pain. 30 tablet 0  . lidocaine-prilocaine (EMLA) cream Apply to affected area once 30 g 3  . Multiple Vitamins-Calcium (ONE-A-DAY WOMENS PO) Take 1 tablet by mouth daily.    . ondansetron (ZOFRAN) 8 MG tablet Take 1 tablet (8 mg total) by mouth 2 (two) times daily as needed. Start on the third day after chemotherapy. 30 tablet 1  . OVER THE COUNTER MEDICATION Take 1  capsule by mouth daily. Biotin/MSM    . prochlorperazine (COMPAZINE) 10 MG tablet Take 1 tablet (10 mg total) by mouth every 6 (six) hours as needed (Nausea or vomiting). 30 tablet 1  . traMADol (ULTRAM) 50 MG tablet Take 1 tablet (50 mg total) by mouth every 6 (six) hours as needed. 15 tablet 1  . vitamin C (ASCORBIC ACID) 500 MG tablet Take 2,000 mg by mouth daily.      Current Facility-Administered Medications  Medication Dose Route Frequency Provider Last Rate Last Dose  . LORazepam (ATIVAN) injection 0.5 mg  0.5 mg Intravenous Once [name], MD       Facility-Administered Medications Ordered in Other Visits  Medication Dose Route Frequency Provider Last Rate Last Dose  . sodium chloride flush (NS) 0.9 % injection 10 mL  10 mL Intracatheter PRN [name], MD   10 mL at 07/07/17 1417    REVIEW OF SYSTEMS:   Constitutional: Denies fevers, chills or abnormal night sweats (+) body hair loss (+) stable weight/weight gain  Eyes: Denies blurriness of vision, double vision or watery eyes Ears, nose, mouth, throat, and face: Denies mucositis or sore throat Respiratory: Denies cough, dyspnea or wheezes Cardiovascular: Denies palpitation, chest discomfort or lower extremity swelling Gastrointestinal:  Denies nausea, heartburn or change in bowel habits Skin: Denies abnormal skin rashes (+) palpable boil in right axilla  Lymphatics: Denies new lymphadenopathy or easy bruising Neurological:Denies numbness, tingling or new weaknesses Behavioral/Psych: Mood is stable, no new changes  All other systems were reviewed with the patient and are negative.   PHYSICAL EXAMINATION: ECOG PERFORMANCE STATUS: 0 - Asymptomatic  Vitals:   07/07/17 1021  BP: 97/66  Pulse: 84  Resp: 18  Temp: 98.1 F (36.7 C)  SpO2: 99%   Filed Weights   07/07/17 1021  Weight: 115 lb 8 oz (52.4 kg)     GENERAL:alert, no distress and comfortable SKIN: skin color, texture, turgor are normal, no rashes or significant  lesions (+) a very  small boil in right axilla  EYES: normal, conjunctiva are pink and non-injected, sclera clear OROPHARYNX:no exudate, no erythema and lips, buccal mucosa, and tongue normal  NECK: supple, thyroid normal size, non-tender, without nodularity LYMPH:  no palpable lymphadenopathy in the cervical, axillary or inguinal LUNGS: clear to auscultation and percussion with normal breathing effort HEART: regular rate & rhythm and no murmurs and no lower extremity edema ABDOMEN:abdomen soft, non-tender and normal bowel sounds Musculoskeletal:no cyanosis of digits and no clubbing  PSYCH: alert & oriented x 3 with fluent speech NEURO: no focal motor/sensory deficits Breast: Status post bilateral mastectomy and tissue expander placements, surgical incisions have healed well, no discharge or skin erythema.   LABORATORY DATA:  I have reviewed the data as listed CBC Latest Ref Rng & Units 07/07/2017 06/30/2017 06/23/2017  WBC 3.9 - 10.3 10e3/uL 7.6 4.6 6.3  Hemoglobin 11.6 - 15.9 g/dL 12.8 11.8 13.3  Hematocrit 34.8 - 46.6 % 37.8 35.4 40.5  Platelets 145 - 400 10e3/uL 179 182 188    CMP Latest Ref Rng & Units 07/07/2017 06/30/2017 06/23/2017  Glucose 70 - 140 mg/dl 101 105 134  BUN 7.0 - 26.0 mg/dL 10.9 14.7 15.3  Creatinine 0.6 - 1.1 mg/dL 0.8 0.7 0.8  Sodium 136 - 145 mEq/L 142 141 139  Potassium 3.5 - 5.1 mEq/L 3.8 3.5 4.1  Chloride 101 - 111 mmol/L - - -  CO2 22 - 29 mEq/L _0 Calcium 8.4 - 10.4 mg/dL 9.1 8.9 9.0  Total Protein 6.4 - 8.3 g/dL 6.4 6.1(L) 6.2(L)  Total Bilirubin 0.20 - 1.20 mg/dL <0.22 0.24 <0.22  Alkaline Phos 40 - 150 U/L 82 111 87  AST 5 - 34 U/L _1 ALT 0 - 55 U/L _2 PATHOLOGY   Diagnosis  04/27/17 1. Breast, simple mastectomy, Right - FIBROCYSTIC CHANGES WITH ADENOSIS. - USUAL DUCTAL HYPERPLASIA. - HEALING BIOPSY SITE. - THERE IS NO EVIDENCE OF MALIGNANCY. 2. Breast, simple mastectomy, Left - INVASIVE DUCTAL CARCINOMA, GRADE II/III,  SPANNING 1.2 CM. - DUCTAL CARCINOMA IN SITU WITH CALCIFICATIONS, HIGH GRADE. - INVASIVE DUCTAL CARCINOMA IS FOCALLY PRESENT AT THE ANTERIOR MARGIN. - DUCTAL CARCINOMA IN SITU IS BROADLY 0.1 CM TO THE POSTERIOR MARGIN. - SEE ONCOLOGY TABLE BELOW. 3. Lymph nodes, regional resection, Left axillary - METASTATIC CARCINOMA IN 8 OF 21 LYMPH NODES (8/21). - SEE COMMENT. Microscopic Comment 2. BREAST, INVASIVE TUMOR Procedure: Simple mastectomy and axillary lymph node resections. Laterality: Left. Tumor Size: 1.2 cm (glass slide measurement). Histologic Type: Ductal Grade: II Tubular Differentiation: 3 Nuclear Pleomorphism: 3 Mitotic Count: 1 Ductal Carcinoma in Situ (DCIS): Present, high grade, extensive. Extent of Tumor: Confined to breast parenchyma. Margins: Invasive carcinoma, distance from closest margin: Focally present at the anterior margin. DCIS, distance from closest margin: Broadly less than 0.1 cm to the posterior margin. Regional Lymph Nodes: Number of Lymph Nodes Examined: 21 1 of 4 FINAL for JARIKA, ROBBEN [phone]) Microscopic Comment(continued) Lymph Nodes with Macrometastases: 6 Lymph Nodes with Micrometastases: 0 Lymph Nodes with Isolated Tumor Cells: 2 Breast Prognostic Profile: [id number] Estrogen Receptor: 95%, strong. Progesterone Receptor: 5%, strong. Her2: No amplification was detected. The ratio is 1.23. Ki-67: 15%. Best tumor block for sendout testing: 2G Pathologic Stage Classification (pTNM, AJCC 8th Edition): Primary Tumor (pT): pT1c Regional Lymph Nodes (pN): pN2a Distant Metastases (pM): pMX Comments: Grossly, there is a 2.5 cm focus of indurated tissue identified. Histologic examination of this area reveals predominately high grade  ductal carcinoma in situ with calcifications. There is associated invasive ductal carcinoma, which is E-cadherin positive, spanning 1.2 cm. (JBK:gt, 05/02/17) 3. Immunohistochemical stains for cytokeratin  AE1/AE3 performed on multiple blocks on part 3 help highlight the presence of metastatic carcinoma. (JBK:gt, 05/02/17)   Diagnosis 03/27/17 1. Breast, left, needle core biopsy -FIBROADENOMATOID NODULE WITH CALCIFICATIONS 2.Breast, left, needle core biopsy -DUCTAL CARCINOMA IN SITU, HIGH GRADE -SEE COMMENT  3. Breast, right, needle, core biopsy -FIBROADENOMA -NO MALIGNANCY IDENTIFIED  Diagnosis 03/22/17 Lymph node, needle/core biopsy, left axillary node -METASTATIC CARCINOMA, SEE COMMENT Microscopic Comment Sections showed lymph node tissue mostly replaced by metastatic carcinoma characterized by sheets of epithelioid appearing cells displaying milder to moderate nuclear pleomorphism, fine chromatin, small nucleoli, and moderately abundant amphophilic to eosinophilic cyto-plasma. Immunohistochemistry staining showed positive for cytokeratin AE1/AE3, GATA-3 and estrogen receptor, with patchy positivity for cytokeratin 7 and GCDFP., Only scattered cells showed progesterone positivity. Negative for S100, melan-a, E-cadherin, cytokeratin 20, CDX-2, TTF-1 or WT-1. The findings are consistent with metastatic carcinoma of breast primary. Lack of significant e-cadherin positivity suggested lobular carcinoma.   GENETICS 04/10/17    PROCEDURES   ECHO 05/31/17 Study Conclusions - Left ventricle: The cavity size was normal. Wall thickness was   normal. Systolic function was normal. The estimated ejection   fraction was in the range of 55% to 60%. Wall motion was normal;   there were no regional wall motion abnormalities. Impressions: - Normal LV systolic and diastolic function; global longitudinal   strain - 17.9%.  RADIOGRAPHIC STUDIES: I have personally reviewed the radiological images as listed and agreed with the findings in the report.  CT CAP 04/07/17 IMPRESSION: 1. Several prominent left axillary lymph nodes which may correspond to biopsy proven axillary lymph node metastasis. 2. No  thoracic adenopathy identified or evidence of distant metastatic disease.   Bone whole body scan 04/07/17 IMPRESSION: Negative for evidence of osseous metastatic disease.  Diagnostic mammogram bilateral 03/21/17 IMPRESSION: INCOMPLETE - ADDITIONAL IMAGING EVALUATION NEEDED 1. The 0.6 cm oval mass in the right breast at 11 o'clock middle depth is indeterminate. An ultrasound is recommended.  2. The 0.5cm round focal asymmetry in the left breast central to the nipple middle depth is indeterminate. An ultrasound is recommended.  3. Ultrasound of the palpable abnormalities in the axilla and far left lateral breast is recommended.  4. Multiple clusters of pleomorphic calcifications in the left upper outer breast anterior depth spanning a 2.5 cm area are highly suggestive of malignancy. A stereotactic biopsy is recommended.   US Breast bilateral 03/21/17 IMPRESSION: HIGHLY SUGGESTIVE OF MALIGNANCY 1. Six distinct abnormal left axillary nodes, 2 of which correspond to the palpable lumps felt by the patient. Findings concerning for metastatic adenopathy. Ultrasound guided biopsy of one of these nodes is recommended.  2. Small 5 mm palpable, oval mass in the left breast 3:00 areolar margin is at an intermediate suspicion. Ultrasound guided biopsy is recommended.  3. Isoechoic 8 mm mass in the right breast 10:00 5 cm from the nipple is also an intermediate suspicion for malignancy, and ultrasound guided biopsy is recommended.  4. Stereotactic guided biopsy for the highly suspicious calcifications in the left breast also recommended as detailed in the mammography report.    ASSESSMENT & PLAN:  Madison Savage is a 46 y.o. perimenopausal female with a history of Chronic ITP and MTHFR mutation, presented with a palpable left axillary adenopathy.  1. 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 She had a 1.2 cm left breast invasive ductal carcinoma, with 8 positive lymph nodes. Surgical margins were negative. -We discussed the risk of cancer recurrence after surgery. Due to her multiple positive lymph nodes, young age, she has high risk for recurrence.  -I recommended adjuvant chemotherapy to reduce her risk of recurrence. I recommend standard dose dense Adriamycin and Cytoxan every 2 weeks, for 4 cycles, followed by weekly Taxol or Abraxane for 12 weeks. Due to her BRCA2 mutation, she probably would benefit from carboplatin also, I plan to add [place] to weekly Taxol. -Her Baseline 9/26/148 ECHO was normal -She started Adjuvant AC on 06/09/17, tolerating well so far, some fatigue, improves with IVF on week 2.. Pt has been using Cold cap and it has been working for her thus far.  -Labs reviewed and overall within normal limits. She is tolerating chemo AC very well. We will continue with IV Fluids for each treatment. I explained she will not likely need IVF with Taxol as it is more tolerable. Plan to start Taxol 08/04/17.  -I discussed the option of Taxol vs Abraxane for her weekly treatment for 12 weeks. I provided reading material to patient. -F/u in 2 weeks before cycle 4 AC  2. Chronic ITP  -Discovered in her Last pregnancy, with platelet count dropped to between 50,000 and 60,000, recovered, her platelet counts is around 120-150K lately  -She does not follow up with hematologist  -I'm concerned her platelet counts will drop significantly with chemotherapy, she may need steroids or additional treatment for her ITP. -Her platelets have overall remained stable while on chemotherapy.   3. Genetics, BRCA2 mutation positive -She has history of MTHFR mutation, which was discovered during her workup before multiple  miscarriage. 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 -She has had bilateral mastectomy, due to the high-risk of breast cancer -She planned to have hysterectomy and BSO after she completes chemo and radiation  -Patient has met our genetic counselor Mrs. Florene Glen, BRCA2 G mutation screening in her children and siblings were recommended.  4. Chest pain, secondary to [facility] and Tissue Expander -Managed with Norco as needed, better now  -continue monitoring    PLAN:  -Lab, flush and IVF 2hr on 11/23 or 11/24  -return in 2 weeks for cycle 4 AC   No orders of the defined types were placed in this encounter.   All questions were answered. The patient knows to call the clinic with any problems, questions or concerns. I spent 20 minutes counseling the patient face to face. The total time spent in the appointment was 30 minutes and more than 50% was on counseling.     [name], MD 07/07/2017    This document serves as a record of services personally performed by [name], MD. It was created on her behalf by Joslyn Devon, a trained medical scribe. The creation of this record is based on the scribe's personal observations and the provider's statements to them. This document has been checked and approved by the attending provider.

## 2017-07-05 ENCOUNTER — Ambulatory Visit: Payer: BLUE CROSS/BLUE SHIELD | Admitting: Physical Therapy

## 2017-07-05 ENCOUNTER — Telehealth: Payer: Self-pay | Admitting: Nurse Practitioner

## 2017-07-05 DIAGNOSIS — R293 Abnormal posture: Secondary | ICD-10-CM

## 2017-07-05 DIAGNOSIS — M25512 Pain in left shoulder: Secondary | ICD-10-CM | POA: Diagnosis not present

## 2017-07-05 DIAGNOSIS — Z483 Aftercare following surgery for neoplasm: Secondary | ICD-10-CM | POA: Diagnosis not present

## 2017-07-05 DIAGNOSIS — M25612 Stiffness of left shoulder, not elsewhere classified: Secondary | ICD-10-CM | POA: Diagnosis not present

## 2017-07-05 DIAGNOSIS — M25611 Stiffness of right shoulder, not elsewhere classified: Secondary | ICD-10-CM | POA: Diagnosis not present

## 2017-07-05 DIAGNOSIS — M25511 Pain in right shoulder: Secondary | ICD-10-CM | POA: Diagnosis not present

## 2017-07-05 NOTE — Therapy (Signed)
[place], [place], [address] Phone: [phone]   Fax:  [phone]  Physical Therapy Treatment  Patient Details  Name: Madison Savage MRN: [id number] Date of Birth: 1970/10/26 Referring Provider: Dr. Burr Medico   Encounter Date: 07/05/2017      PT End of Session - 07/05/17 1150    Visit Number 8   Number of Visits 17   Date for PT Re-Evaluation 07/25/17   PT Start Time 1057   PT Stop Time 1100   PT Time Calculation (min) 3 min   Activity Tolerance Patient tolerated treatment well   Behavior During Therapy [facility] for tasks assessed/performed      Past Medical History:  Diagnosis Date  . Anemia   . Anxiety   . Cancer ([place]) 03/2017   left breast cancer  . Chronic ITP (idiopathic thrombocytopenia) (HCC)   . Family history of breast cancer   . Family history of ovarian cancer   . Headache    last migraine...9/24  . Heart murmur    at birth.  none now  . MTHFR mutation [facility])     Past Surgical History:  Procedure Laterality Date  . AXILLARY LYMPH NODE DISSECTION Left 04/27/2017   Procedure: AXILLARY LYMPH NODE DISSECTION;  Surgeon: [name], MD;  Location: [place];  Service: General;  Laterality: Left;  . BILATERAL TOTAL MASTECTOMY WITH AXILLARY LYMPH NODE DISSECTION Bilateral 04/27/2017   Procedure: SKIN SPARING MASTECTOMY;  Surgeon: [name], MD;  Location: [place];  Service: General;  Laterality: Bilateral;  . BREAST RECONSTRUCTION WITH PLACEMENT OF TISSUE EXPANDER AND FLEX HD (ACELLULAR HYDRATED DERMIS) Bilateral 04/27/2017   Procedure: BILATERAL BREAST RECONSTRUCTION WITH PLACEMENT OF TISSUE EXPANDER AND FLEX HD (ACELLULAR HYDRATED DERMIS);  Surgeon: Wallace Going, DO;  Location: [place];  Service: Plastics;  Laterality: Bilateral;  . BREAST SURGERY    . MASTECTOMY    . PORTACATH PLACEMENT Right 06/01/2017   Procedure:  INSERTION PORT-A-CATH RIGHT SUBCLAVIAN;  Surgeon: [name], MD;  Location: [place];  Service: General;  Laterality: Right;  . WISDOM TOOTH EXTRACTION      There were no vitals filed for this visit.      Subjective Assessment - 07/05/17 1022    Subjective Pt states she has been doing exercises at home She has another chemo treatment on Friday.    Pertinent History Patient was diagnosed on 03/21/17 with left invasive lobular carcinoma breast cancer. It measures 2.1 cm and is located in the upper outer quadrant. She also has 6 abnormal axillary lymph nodes and a node was biopsied and found to be positive for carcinoma.   04/27/2017 .bilateral mastecomy with ALND ( 21 nodes removed) with immedicate expanders. She is planning to have chemo, then have implant surgery and hysterectomy and then have radiation    Patient Stated Goals to get motion in arm back, return to exercise, and learn what to do to prevent lymphedema    Currently in Pain? No/denies            [facility] PT Assessment - 07/05/17 0001      AROM   Right Shoulder Flexion 162 Degrees   Right Shoulder ABduction 155 Degrees   Left Shoulder Flexion 140 Degrees   Left Shoulder ABduction 140 Degrees                     [facility] Adult PT Treatment/Exercise - 07/05/17 0001  Self-Care   Other Self-Care Comments  suggested pt put small pillow in left arm at area of tighness and see if it will improved cording and tightness there.  Will send script to Dr. Burr Medico for compression sleeve and gauntlet      Shoulder Exercises: Sidelying   ABduction AROM;Left;5 reps     Shoulder Exercises: Standing   Other Standing Exercises yellow theraband around wrists with hands on wall retracting scapula by pulling hand back off wall, alternating L and R x 10 reps each     Shoulder Exercises: Pulleys   Flexion 2 minutes   ABduction 2 minutes     Manual Therapy   Myofascial Release prolonged trigger point release at left pec major  tendon and in left axilla. Cording and tighness noted.                    Short Term Clinic Goals - 07/05/17 1154      CC Short Term Goal  #1   Title pt will be knowledgable of lymphedema risk reduction practices    Time 4   Period Weeks   Status On-going     CC Short Term Goal  #2   Title Pt will be independent in postural and shoulder stretching exercises   Status Achieved           Breast Clinic Goals - 03/29/17 1232      Patient will be able to verbalize understanding of pertinent lymphedema risk reduction practices relevant to her diagnosis specifically related to skin care.   Time 1   Period Days   Status Achieved     Patient will be able to return demonstrate and/or verbalize understanding of the post-op home exercise program related to regaining shoulder range of motion.   Time 1   Period Days   Status Achieved     Patient will be able to verbalize understanding of the importance of attending the postoperative After Breast Cancer Class for further lymphedema risk reduction education and therapeutic exercise.   Time 1   Period Days   Status Achieved          Long Term Clinic Goals - 07/05/17 1155      CC Long Term Goal  #1   Title Pt will have 140 degrees of left shoulder abduction so that she can received radiation treatment    Baseline 05/25/2017: 60 degrees , 07/05/2017  140 degrees    Status Achieved     CC Long Term Goal  #2   Title Pt will ahve 150 degrees of right shoulder abduction so that she can perform household activities without difficulty    Baseline 05/25/2017: 95 degrees , 155 degrees on 07/05/2017   Status Achieved     CC Long Term Goal  #3   Title Pt will be independent in strength ABC program with knowledge of how to safely increase her intensity of exercise   Status On-going     CC Long Term Goal  #4   Title Pt will decrease Quick DASH score to < 20 indicating a function improvment of arms    Baseline 52.27 on 05/25/2017     Status On-going            Plan - 07/05/17 1151    Clinical Impression Statement Pt is much improved in shoudler ROM and has met intital goals. She continues to have tightness in left anterior and lateral chest and axilla.  She is ready to get  prophylactic compression sleeve as she gets releif from tg soft symptomatically and it has helped with cording in arm    Rehab Potential Good   Clinical Impairments Affecting Rehab Potential 21 nodes removed in left axilla    PT Frequency 2x / week   PT Duration 8 weeks   PT Treatment/Interventions Therapeutic exercise;Patient/family education;ADLs/Self Care Home Management;Taping;DME Instruction;Dry needling;Functional mobility training;Therapeutic activities;Manual lymph drainage;Manual techniques;Passive range of motion   PT Next Visit Plan  Continue with soft tissue work and myofascial release with PROM and joint mobilization as needed  Issue script for sleev if is back recheck with exercise as needed       Patient will benefit from skilled therapeutic intervention in order to improve the following deficits and impairments:     Visit Diagnosis: Stiffness of left shoulder, not elsewhere classified  Acute pain of left shoulder  Abnormal posture  Pain of right shoulder joint on movement  Stiffness of right shoulder, not elsewhere classified  Aftercare following surgery for neoplasm     Problem List Patient Active Problem List   Diagnosis Date Noted  . Breast cancer ([place]) 04/27/2017  . Genetic testing 04/17/2017  . BRCA2 gene mutation positive 04/17/2017  . Family history of breast cancer   . Family history of ovarian cancer   . Breast cancer metastasized to axillary lymph node, left ([place]) 03/29/2017  . Cough 03/10/2014  . Esophagitis 03/10/2014  . Chronic ITP (idiopathic thrombocytopenia) (HCC) 03/10/2014  . ALLERGIC REACTION, ACUTE 07/22/2008   Donato Heinz. Owens Shark PT  Norwood Levo 07/05/2017, 11:56 AM  [place], [place], [address] Phone: [phone]   Fax:  [phone]  Name: Madison Savage MRN: [id number] Date of Birth: [birth year]

## 2017-07-05 NOTE — Telephone Encounter (Signed)
Scheduled appt per 10/26 los - patient to get an updated schedule next visit.

## 2017-07-07 ENCOUNTER — Ambulatory Visit (HOSPITAL_BASED_OUTPATIENT_CLINIC_OR_DEPARTMENT_OTHER): Payer: BLUE CROSS/BLUE SHIELD

## 2017-07-07 ENCOUNTER — Ambulatory Visit (HOSPITAL_BASED_OUTPATIENT_CLINIC_OR_DEPARTMENT_OTHER): Payer: BLUE CROSS/BLUE SHIELD | Admitting: Hematology

## 2017-07-07 ENCOUNTER — Ambulatory Visit: Payer: BLUE CROSS/BLUE SHIELD

## 2017-07-07 ENCOUNTER — Other Ambulatory Visit (HOSPITAL_BASED_OUTPATIENT_CLINIC_OR_DEPARTMENT_OTHER): Payer: BLUE CROSS/BLUE SHIELD

## 2017-07-07 ENCOUNTER — Encounter: Payer: Self-pay | Admitting: Hematology

## 2017-07-07 VITALS — BP 97/66 | HR 84 | Temp 98.1°F | Resp 18 | Ht 67.5 in | Wt 115.5 lb

## 2017-07-07 DIAGNOSIS — C50912 Malignant neoplasm of unspecified site of left female breast: Secondary | ICD-10-CM | POA: Diagnosis not present

## 2017-07-07 DIAGNOSIS — C773 Secondary and unspecified malignant neoplasm of axilla and upper limb lymph nodes: Secondary | ICD-10-CM

## 2017-07-07 DIAGNOSIS — Z170 Estrogen receptor positive status [ER+]: Secondary | ICD-10-CM

## 2017-07-07 DIAGNOSIS — Z95828 Presence of other vascular implants and grafts: Secondary | ICD-10-CM

## 2017-07-07 DIAGNOSIS — D693 Immune thrombocytopenic purpura: Secondary | ICD-10-CM

## 2017-07-07 DIAGNOSIS — Z5111 Encounter for antineoplastic chemotherapy: Secondary | ICD-10-CM

## 2017-07-07 DIAGNOSIS — Z5189 Encounter for other specified aftercare: Secondary | ICD-10-CM

## 2017-07-07 LAB — COMPREHENSIVE METABOLIC PANEL
ALT: 11 U/L (ref 0–55)
AST: 11 U/L (ref 5–34)
Albumin: 3.8 g/dL (ref 3.5–5.0)
Alkaline Phosphatase: 82 U/L (ref 40–150)
Anion Gap: 8 mEq/L (ref 3–11)
BUN: 10.9 mg/dL (ref 7.0–26.0)
CO2: 26 mEq/L (ref 22–29)
Calcium: 9.1 mg/dL (ref 8.4–10.4)
Chloride: 109 mEq/L (ref 98–109)
Creatinine: 0.8 mg/dL (ref 0.6–1.1)
EGFR: >60 mL/min/{1.73_m2} (ref >60)
Glucose: 101 mg/dl (ref 70–140)
Potassium: 3.8 mEq/L (ref 3.5–5.1)
Sodium: 142 mEq/L (ref 136–145)
Total Bilirubin: <0.22 mg/dL (ref 0.20–1.20)
Total Protein: 6.4 g/dL (ref 6.4–8.3)

## 2017-07-07 LAB — CBC WITH DIFFERENTIAL/PLATELET
BASO%: 0.3 % (ref 0.0–2.0)
Basophils Absolute: 0 10*3/uL (ref 0.0–0.1)
EOS%: 0.1 % (ref 0.0–7.0)
Eosinophils Absolute: 0 10*3/uL (ref 0.0–0.5)
HCT: 37.8 % (ref 34.8–46.6)
HGB: 12.8 g/dL (ref 11.6–15.9)
LYMPH%: 12.6 % — ABNORMAL LOW (ref 14.0–49.7)
MCH: 30.7 pg (ref 25.1–34.0)
MCHC: 33.9 g/dL (ref 31.5–36.0)
MCV: 90.5 fL (ref 79.5–101.0)
MONO#: 0.9 10*3/uL (ref 0.1–0.9)
MONO%: 11.8 % (ref 0.0–14.0)
NEUT#: 5.7 10*3/uL (ref 1.5–6.5)
NEUT%: 75.2 % (ref 38.4–76.8)
Platelets: 179 10*3/uL (ref 145–400)
RBC: 4.18 10*6/uL (ref 3.70–5.45)
RDW: 14 % (ref 11.2–14.5)
WBC: 7.6 10*3/uL (ref 3.9–10.3)
lymph#: 1 10*3/uL (ref 0.9–3.3)

## 2017-07-07 MED ORDER — SODIUM CHLORIDE 0.9% FLUSH
10.0000 mL | INTRAVENOUS | Status: DC | PRN
  Administered 2017-07-07: 10 mL
  Filled 2017-07-07: qty 10

## 2017-07-07 MED ORDER — PALONOSETRON HCL INJECTION 0.25 MG/5ML
INTRAVENOUS | Status: AC
  Filled 2017-07-07: qty 5

## 2017-07-07 MED ORDER — SODIUM CHLORIDE 0.9 % IV SOLN
Freq: Once | INTRAVENOUS | Status: AC
  Administered 2017-07-07: 12:00:00 via INTRAVENOUS

## 2017-07-07 MED ORDER — PEGFILGRASTIM 6 MG/0.6ML ~~LOC~~ PSKT
6.0000 mg | PREFILLED_SYRINGE | Freq: Once | SUBCUTANEOUS | Status: AC
  Administered 2017-07-07: 6 mg via SUBCUTANEOUS
  Filled 2017-07-07: qty 0.6

## 2017-07-07 MED ORDER — SODIUM CHLORIDE 0.9 % IV SOLN
Freq: Once | INTRAVENOUS | Status: AC
  Administered 2017-07-07: 12:00:00 via INTRAVENOUS
  Filled 2017-07-07: qty 5

## 2017-07-07 MED ORDER — SODIUM CHLORIDE 0.9% FLUSH
10.0000 mL | INTRAVENOUS | Status: DC | PRN
  Administered 2017-07-07: 10 mL via INTRAVENOUS
  Filled 2017-07-07: qty 10

## 2017-07-07 MED ORDER — PALONOSETRON HCL INJECTION 0.25 MG/5ML
0.2500 mg | Freq: Once | INTRAVENOUS | Status: AC
  Administered 2017-07-07: 0.25 mg via INTRAVENOUS

## 2017-07-07 MED ORDER — SODIUM CHLORIDE 0.9 % IV SOLN
600.0000 mg/m2 | Freq: Once | INTRAVENOUS | Status: AC
  Administered 2017-07-07: 940 mg via INTRAVENOUS
  Filled 2017-07-07: qty 47

## 2017-07-07 MED ORDER — DOXORUBICIN HCL CHEMO IV INJECTION 2 MG/ML
60.0000 mg/m2 | Freq: Once | INTRAVENOUS | Status: AC
  Administered 2017-07-07: 94 mg via INTRAVENOUS
  Filled 2017-07-07: qty 47

## 2017-07-07 MED ORDER — HEPARIN SOD (PORK) LOCK FLUSH 100 UNIT/ML IV SOLN
500.0000 [IU] | Freq: Once | INTRAVENOUS | Status: AC | PRN
  Administered 2017-07-07: 500 [IU]
  Filled 2017-07-07: qty 5

## 2017-07-07 NOTE — Patient Instructions (Signed)
Implanted Port Home Guide An implanted port is a type of central line that is placed under the skin. Central lines are used to provide IV access when treatment or nutrition needs to be given through a person's veins. Implanted ports are used for long-term IV access. An implanted port may be placed because:  You need IV medicine that would be irritating to the small veins in your hands or arms.  You need long-term IV medicines, such as antibiotics.  You need IV nutrition for a long period.  You need frequent blood draws for lab tests.  You need dialysis.  Implanted ports are usually placed in the chest area, but they can also be placed in the upper arm, the abdomen, or the leg. An implanted port has two main parts:  Reservoir. The reservoir is round and will appear as a small, raised area under your skin. The reservoir is the part where a needle is inserted to give medicines or draw blood.  Catheter. The catheter is a thin, flexible tube that extends from the reservoir. The catheter is placed into a large vein. Medicine that is inserted into the reservoir goes into the catheter and then into the vein.  How will I care for my incision site? Do not get the incision site wet. Bathe or shower as directed by your health care provider. How is my port accessed? Special steps must be taken to access the port:  Before the port is accessed, a numbing cream can be placed on the skin. This helps numb the skin over the port site.  Your health care provider uses a sterile technique to access the port. ? Your health care provider must put on a mask and sterile gloves. ? The skin over your port is cleaned carefully with an antiseptic and allowed to dry. ? The port is gently pinched between sterile gloves, and a needle is inserted into the port.  Only "non-coring" port needles should be used to access the port. Once the port is accessed, a blood return should be checked. This helps ensure that the port  is in the vein and is not clogged.  If your port needs to remain accessed for a constant infusion, a clear (transparent) bandage will be placed over the needle site. The bandage and needle will need to be changed every week, or as directed by your health care provider.  Keep the bandage covering the needle clean and dry. Do not get it wet. Follow your health care provider's instructions on how to take a shower or bath while the port is accessed.  If your port does not need to stay accessed, no bandage is needed over the port.  What is flushing? Flushing helps keep the port from getting clogged. Follow your health care provider's instructions on how and when to flush the port. Ports are usually flushed with saline solution or a medicine called heparin. The need for flushing will depend on how the port is used.  If the port is used for intermittent medicines or blood draws, the port will need to be flushed: ? After medicines have been given. ? After blood has been drawn. ? As part of routine maintenance.  If a constant infusion is running, the port may not need to be flushed.  How long will my port stay implanted? The port can stay in for as long as your health care provider thinks it is needed. When it is time for the port to come out, surgery will be   done to remove it. The procedure is similar to the one performed when the port was put in. When should I seek immediate medical care? When you have an implanted port, you should seek immediate medical care if:  You notice a bad smell coming from the incision site.  You have swelling, redness, or drainage at the incision site.  You have more swelling or pain at the port site or the surrounding area.  You have a fever that is not controlled with medicine.  This information is not intended to replace advice given to you by your health care provider. Make sure you discuss any questions you have with your health care provider. Document  Released: 08/22/2005 Document Revised: 01/28/2016 Document Reviewed: 04/29/2013 Elsevier Interactive Patient Education  2017 [employer] Inc.  

## 2017-07-07 NOTE — Patient Instructions (Signed)
[facility] Discharge Instructions for Patients Receiving Chemotherapy  Today you received the following chemotherapy agents Adriamycin, Cytoxan.  To help prevent nausea and vomiting after your treatment, we encourage you to take your nausea medication as prescribed.   If you develop nausea and vomiting that is not controlled by your nausea medication, call the clinic.   BELOW ARE SYMPTOMS THAT SHOULD BE REPORTED IMMEDIATELY:  *FEVER GREATER THAN 100.5 F  *CHILLS WITH OR WITHOUT FEVER  NAUSEA AND VOMITING THAT IS NOT CONTROLLED WITH YOUR NAUSEA MEDICATION  *UNUSUAL SHORTNESS OF BREATH  *UNUSUAL BRUISING OR BLEEDING  TENDERNESS IN MOUTH AND THROAT WITH OR WITHOUT PRESENCE OF ULCERS  *URINARY PROBLEMS  *BOWEL PROBLEMS  UNUSUAL RASH Items with * indicate a potential emergency and should be followed up as soon as possible.  Feel free to call the clinic should you have any questions or concerns. The clinic phone number is [phone].  Please show the CHEMO ALERT CARD at check-in to the Emergency Department and triage nurse.   

## 2017-07-10 ENCOUNTER — Telehealth: Payer: Self-pay | Admitting: Hematology

## 2017-07-10 NOTE — Telephone Encounter (Signed)
Called patient regarding cancellation

## 2017-07-11 ENCOUNTER — Ambulatory Visit: Payer: BLUE CROSS/BLUE SHIELD | Attending: General Surgery | Admitting: Physical Therapy

## 2017-07-11 ENCOUNTER — Encounter: Payer: Self-pay | Admitting: Physical Therapy

## 2017-07-11 DIAGNOSIS — M25612 Stiffness of left shoulder, not elsewhere classified: Secondary | ICD-10-CM | POA: Insufficient documentation

## 2017-07-11 DIAGNOSIS — Z483 Aftercare following surgery for neoplasm: Secondary | ICD-10-CM | POA: Insufficient documentation

## 2017-07-11 DIAGNOSIS — R293 Abnormal posture: Secondary | ICD-10-CM | POA: Insufficient documentation

## 2017-07-11 DIAGNOSIS — M25511 Pain in right shoulder: Secondary | ICD-10-CM

## 2017-07-11 DIAGNOSIS — M25611 Stiffness of right shoulder, not elsewhere classified: Secondary | ICD-10-CM

## 2017-07-11 DIAGNOSIS — M25512 Pain in left shoulder: Secondary | ICD-10-CM | POA: Diagnosis not present

## 2017-07-11 NOTE — Therapy (Signed)
[place], [place], [address] Phone: [phone]   Fax:  [phone]  Physical Therapy Treatment  Patient Details  Name: Madison Savage MRN: [id number] Date of Birth: Jan 18, 1971 Referring Provider: Dr. Burr Medico    Encounter Date: 07/11/2017  PT End of Session - 07/11/17 1720    Visit Number  9    Number of Visits  17    Date for PT Re-Evaluation  07/25/17    PT Start Time  3532    PT Stop Time  1655    PT Time Calculation (min)  45 min    Activity Tolerance  Patient tolerated treatment well    Behavior During Therapy  Madison Savage for tasks assessed/performed       Past Medical History:  Diagnosis Date  . Anemia   . Anxiety   . Cancer ([place]) 03/2017   left breast cancer  . Chronic ITP (idiopathic thrombocytopenia) (HCC)   . Family history of breast cancer   . Family history of ovarian cancer   . Headache    last migraine...9/24  . Heart murmur    at birth.  none now  . MTHFR mutation [facility])     Past Surgical History:  Procedure Laterality Date  . BREAST SURGERY    . MASTECTOMY    . WISDOM TOOTH EXTRACTION      There were no vitals filed for this visit.  Subjective Assessment - 07/11/17 1716    Subjective  Pt had  a chemo treatment on Friday.  She is doing well.     Pertinent History  Patient was diagnosed on 03/21/17 with left invasive lobular carcinoma breast cancer. It measures 2.1 cm and is located in the upper outer quadrant. She also has 6 abnormal axillary lymph nodes and a node was biopsied and found to be positive for carcinoma.   04/27/2017 .bilateral mastecomy with ALND ( 21 nodes removed) with immedicate expanders. She is planning to have chemo, then have implant surgery and hysterectomy and then have radiation     Patient Stated Goals  to get motion in arm back, return to exercise, and learn what to do to prevent lymphedema     Currently in Pain?  No/denies                       [facility] Adult PT Treatment/Exercise - 07/11/17 0001      Self-Care   Other Self-Care Comments   began instruction in Strength ABC program with basic information and UE stretched.       Shoulder Exercises: Stretch   Other Shoulder Stretches  modified downward dog  doorway stretch     Other Shoulder Stretches  over soft foam roller for horizontal abduction and shoulder flexion as well as abdominal exercise       Manual Therapy   Myofascial Release  stretching at left lateral chest near axilla     Passive ROM  PROM to left shoulder in direction of flexion, abduction providing pressure to pec to help release             PT Education - 07/11/17 1719    Education provided  Yes    Education Details  began teaching Strength ABC program     Person(s) Educated  Patient    Methods  Explanation;Demonstration;Handout    Comprehension  Need further instruction        Short Term Clinic Goals - 07/05/17 1154  CC Short Term Goal  #1   Title  pt will be knowledgable of lymphedema risk reduction practices     Time  4    Period  Weeks    Status  On-going      CC Short Term Goal  #2   Title  Pt will be independent in postural and shoulder stretching exercises    Status  Achieved        Breast Clinic Goals - 03/29/17 1232      Patient will be able to verbalize understanding of pertinent lymphedema risk reduction practices relevant to her diagnosis specifically related to skin care.   Time  1    Period  Days    Status  Achieved      Patient will be able to return demonstrate and/or verbalize understanding of the post-op home exercise program related to regaining shoulder range of motion.   Time  1    Period  Days    Status  Achieved      Patient will be able to verbalize understanding of the importance of attending the postoperative After Breast Cancer Class for further lymphedema risk reduction education and therapeutic exercise.   Time  1     Period  Days    Status  Achieved       Long Term Clinic Goals - 07/05/17 1155      CC Long Term Goal  #1   Title  Pt will have 140 degrees of left shoulder abduction so that she can received radiation treatment     Baseline  05/25/2017: 60 degrees , 07/05/2017  140 degrees     Status  Achieved      CC Long Term Goal  #2   Title  Pt will ahve 150 degrees of right shoulder abduction so that she can perform household activities without difficulty     Baseline  05/25/2017: 95 degrees , 155 degrees on 07/05/2017    Status  Achieved      CC Long Term Goal  #3   Title  Pt will be independent in strength ABC program with knowledge of how to safely increase her intensity of exercise    Status  On-going      CC Long Term Goal  #4   Title  Pt will decrease Quick DASH score to < 20 indicating a function improvment of arms     Baseline  52.27 on 05/25/2017     Status  On-going         Plan - 07/11/17 1721    Clinical Impression Statement  Madison Savage appears to be doing well, but still has tighness in extremes of left shoulder flexion and abduction.  She is ready to learn the Strength ABC program and is anxious to return to lifting weights. Instructed in weight progression today.     Rehab Potential  Good    Clinical Impairments Affecting Rehab Potential  21 nodes removed in left axilla     PT Duration  8 weeks    PT Treatment/Interventions  Therapeutic exercise;Patient/family education;ADLs/Self Care Home Management;Taping;DME Instruction;Dry needling;Functional mobility training;Therapeutic activities;Manual lymph drainage;Manual techniques;Passive range of motion    PT Next Visit Plan   Continue with soft tissue work and myofascial release with PROM and joint mobilization as needed  Issue script for sleev if is back Teach and perform strength ABCprogram  Add exercise as needed     Consulted and Agree with Plan of Care  Patient  Patient will benefit from skilled therapeutic intervention  in order to improve the following deficits and impairments:  Decreased range of motion, Impaired UE functional use, Pain, Decreased knowledge of precautions, Postural dysfunction, Decreased knowledge of use of DME, Decreased strength, Increased fascial restricitons, Impaired perceived functional ability  Visit Diagnosis: Stiffness of left shoulder, not elsewhere classified  Acute pain of left shoulder  Abnormal posture  Pain of right shoulder joint on movement  Stiffness of right shoulder, not elsewhere classified  Aftercare following surgery for neoplasm     Problem List Patient Active Problem List   Diagnosis Date Noted  . Breast cancer ([place]) 04/27/2017  . Genetic testing 04/17/2017  . BRCA2 gene mutation positive 04/17/2017  . Family history of breast cancer   . Family history of ovarian cancer   . Breast cancer metastasized to axillary lymph node, left ([place]) 03/29/2017  . Cough 03/10/2014  . Esophagitis 03/10/2014  . Chronic ITP (idiopathic thrombocytopenia) (HCC) 03/10/2014  . ALLERGIC REACTION, ACUTE 07/22/2008   Donato Heinz. Owens Shark PT  [place] Levo 07/11/2017, 5:26 PM  [place], [place], [address] Phone: [phone]   Fax:  [phone]  Name: Madison Savage MRN: [id number] Date of Birth: 1971-09-02

## 2017-07-13 ENCOUNTER — Other Ambulatory Visit: Payer: Self-pay

## 2017-07-13 ENCOUNTER — Ambulatory Visit: Payer: BLUE CROSS/BLUE SHIELD | Admitting: Physical Therapy

## 2017-07-13 ENCOUNTER — Encounter: Payer: Self-pay | Admitting: Physical Therapy

## 2017-07-13 DIAGNOSIS — R293 Abnormal posture: Secondary | ICD-10-CM | POA: Diagnosis not present

## 2017-07-13 DIAGNOSIS — M25612 Stiffness of left shoulder, not elsewhere classified: Secondary | ICD-10-CM

## 2017-07-13 DIAGNOSIS — Z483 Aftercare following surgery for neoplasm: Secondary | ICD-10-CM

## 2017-07-13 DIAGNOSIS — M25611 Stiffness of right shoulder, not elsewhere classified: Secondary | ICD-10-CM | POA: Diagnosis not present

## 2017-07-13 DIAGNOSIS — M25512 Pain in left shoulder: Secondary | ICD-10-CM | POA: Diagnosis not present

## 2017-07-13 DIAGNOSIS — M25511 Pain in right shoulder: Secondary | ICD-10-CM

## 2017-07-13 NOTE — Therapy (Signed)
[place], [place], [address] Phone: [phone]   Fax:  [phone]  Physical Therapy Treatment  Patient Details  Name: Madison Savage MRN: [id number] Date of Birth: 1970-10-15 Referring Provider: Dr. Burr Medico    Encounter Date: 07/13/2017  PT End of Session - 07/13/17 1711    Visit Number  10    Number of Visits  17    Date for PT Re-Evaluation  07/25/17    PT Start Time  1602    PT Stop Time  1646    PT Time Calculation (min)  44 min    Activity Tolerance  Patient tolerated treatment well    Behavior During Therapy  [facility] for tasks assessed/performed       Past Medical History:  Diagnosis Date  . Anemia   . Anxiety   . Cancer ([place]) 03/2017   left breast cancer  . Chronic ITP (idiopathic thrombocytopenia) (HCC)   . Family history of breast cancer   . Family history of ovarian cancer   . Headache    last migraine...9/24  . Heart murmur    at birth.  none now  . MTHFR mutation [facility])     Past Surgical History:  Procedure Laterality Date  . BREAST SURGERY    . MASTECTOMY    . WISDOM TOOTH EXTRACTION      There were no vitals filed for this visit.  Subjective Assessment - 07/13/17 1606    Subjective  Pt states she has pain in back of her right arm and axilla since Tuesday, and also some itching and chest tightness     Pertinent History  Patient was diagnosed on 03/21/17 with left invasive lobular carcinoma breast cancer. It measures 2.1 cm and is located in the upper outer quadrant. She also has 6 abnormal axillary lymph nodes and a node was biopsied and found to be positive for carcinoma.   04/27/2017 .bilateral mastecomy with ALND ( 21 nodes removed) with immedicate expanders. She is planning to have chemo, then have implant surgery and hysterectomy and then have radiation     Patient Stated Goals  to get motion in arm back, return to exercise, and learn what to do to prevent lymphedema     Currently in Pain?  Yes    Pain Score  4     Pain Location  Axilla    Pain Orientation  Right    Pain Descriptors / Indicators  Aching                      [facility] Adult PT Treatment/Exercise - 07/13/17 0001      Self-Care   Self-Care  Other Self-Care Comments    Other Self-Care Comments   gave pt signed prescription for compression sleeve and information about how to call [facility]       Manual Therapy   Myofascial Release  stretching at left lateral chest near axilla  pt with good myofascial release after a few minutes  stretching at right posterior axilla at areas of perceived. Also with stretching to left pac major tendon     Passive ROM  PROM to left and right shoulders in supine and sidelying  in direction of flexion, abduction providing pressure to pec to help release                Short Term Clinic Goals - 07/05/17 1154      CC Short Term Goal  #  1   Title  pt will be knowledgable of lymphedema risk reduction practices     Time  4    Period  Weeks    Status  On-going      CC Short Term Goal  #2   Title  Pt will be independent in postural and shoulder stretching exercises    Status  Achieved        Breast Clinic Goals - 03/29/17 1232      Patient will be able to verbalize understanding of pertinent lymphedema risk reduction practices relevant to her diagnosis specifically related to skin care.   Time  1    Period  Days    Status  Achieved      Patient will be able to return demonstrate and/or verbalize understanding of the post-op home exercise program related to regaining shoulder range of motion.   Time  1    Period  Days    Status  Achieved      Patient will be able to verbalize understanding of the importance of attending the postoperative After Breast Cancer Class for further lymphedema risk reduction education and therapeutic exercise.   Time  1    Period  Days    Status  Achieved       Long Term Clinic Goals - 07/05/17  1155      CC Long Term Goal  #1   Title  Pt will have 140 degrees of left shoulder abduction so that she can received radiation treatment     Baseline  05/25/2017: 60 degrees , 07/05/2017  140 degrees     Status  Achieved      CC Long Term Goal  #2   Title  Pt will ahve 150 degrees of right shoulder abduction so that she can perform household activities without difficulty     Baseline  05/25/2017: 95 degrees , 155 degrees on 07/05/2017    Status  Achieved      CC Long Term Goal  #3   Title  Pt will be independent in strength ABC program with knowledge of how to safely increase her intensity of exercise    Status  On-going      CC Long Term Goal  #4   Title  Pt will decrease Quick DASH score to < 20 indicating a function improvment of arms     Baseline  52.27 on 05/25/2017     Status  On-going         Plan - 07/13/17 1711    Clinical Impression Statement  Overall pt appears to be doing well, but continues to have tightness in her chest and , today, pain in right posterior axilla.  Focused on soft tissue work today to help this and will do Strength ABC next session     Rehab Potential  Good    Clinical Impairments Affecting Rehab Potential  21 nodes removed in left axilla     PT Duration  8 weeks    PT Next Visit Plan   Teach and perform strength ABCprogram  Myofascial PROM and soft tissue work as needed Add exercise as needed        Patient will benefit from skilled therapeutic intervention in order to improve the following deficits and impairments:  Decreased range of motion, Impaired UE functional use, Pain, Decreased knowledge of precautions, Postural dysfunction, Decreased knowledge of use of DME, Decreased strength, Increased fascial restricitons, Impaired perceived functional ability  Visit Diagnosis: Stiffness of left shoulder, not  elsewhere classified  Acute pain of left shoulder  Abnormal posture  Pain of right shoulder joint on movement  Stiffness of right shoulder,  not elsewhere classified  Aftercare following surgery for neoplasm     Problem List Patient Active Problem List   Diagnosis Date Noted  . Breast cancer ([place]) 04/27/2017  . Genetic testing 04/17/2017  . BRCA2 gene mutation positive 04/17/2017  . Family history of breast cancer   . Family history of ovarian cancer   . Breast cancer metastasized to axillary lymph node, left ([place]) 03/29/2017  . Cough 03/10/2014  . Esophagitis 03/10/2014  . Chronic ITP (idiopathic thrombocytopenia) (HCC) 03/10/2014  . ALLERGIC REACTION, ACUTE 07/22/2008   Donato Heinz. Owens Shark PT  [place] Levo 07/13/2017, 5:14 PM  [place], [place], [address] Phone: [phone]   Fax:  [phone]  Name: Madison Savage MRN: [id number] Date of Birth: [birth year]

## 2017-07-13 NOTE — Patient Instructions (Signed)
First of all, check with your insurance company to see if provider is in network    [facility]   (for wigs and compression sleeves / gloves/gauntlets )  [address] [place], [place] [address] [phone]  Will file some insurances --- call for appointment   [facility] (for mastectomy prosthetics and garments) [address] [place], [place] [address] [phone] Will file some insurances --- call for appointment  [facility]  [address]  [place], [place] [address] [phone] Lower extremity garments  [facility] [address] [place], [place]  [address] [phone]  [employer] Sales rep:  Matt Lawson:  [phone] [email or web address] Biocompression pumps   [employer] [employer]  Sales rep: Robert Rollins:  [phone] [email or web address] Entre and Flexitouch pumps    Other Resources: National Lymphedema Network:  www.lymphnet.org [email or web address] for patient articles and purchase a self manual lymph drainage DVD [email or web address] has informative articles.  

## 2017-07-14 ENCOUNTER — Other Ambulatory Visit: Payer: BLUE CROSS/BLUE SHIELD

## 2017-07-14 ENCOUNTER — Ambulatory Visit (HOSPITAL_BASED_OUTPATIENT_CLINIC_OR_DEPARTMENT_OTHER): Payer: BLUE CROSS/BLUE SHIELD

## 2017-07-14 ENCOUNTER — Ambulatory Visit: Payer: BLUE CROSS/BLUE SHIELD | Admitting: Nurse Practitioner

## 2017-07-14 VITALS — BP 109/58 | HR 87 | Temp 98.2°F | Resp 18

## 2017-07-14 DIAGNOSIS — C773 Secondary and unspecified malignant neoplasm of axilla and upper limb lymph nodes: Secondary | ICD-10-CM

## 2017-07-14 DIAGNOSIS — C50912 Malignant neoplasm of unspecified site of left female breast: Secondary | ICD-10-CM

## 2017-07-14 MED ORDER — HEPARIN SOD (PORK) LOCK FLUSH 100 UNIT/ML IV SOLN
500.0000 [IU] | INTRAVENOUS | Status: AC | PRN
  Administered 2017-07-14: 500 [IU]
  Filled 2017-07-14: qty 5

## 2017-07-14 MED ORDER — ALTEPLASE 2 MG IJ SOLR
2.0000 mg | Freq: Once | INTRAMUSCULAR | Status: DC | PRN
  Filled 2017-07-14: qty 2

## 2017-07-14 MED ORDER — SODIUM CHLORIDE 0.9% FLUSH
10.0000 mL | INTRAVENOUS | Status: AC | PRN
  Administered 2017-07-14: 10 mL
  Filled 2017-07-14: qty 10

## 2017-07-14 MED ORDER — SODIUM CHLORIDE 0.9 % IV SOLN
Freq: Once | INTRAVENOUS | Status: AC
  Administered 2017-07-14: 11:00:00 via INTRAVENOUS

## 2017-07-14 NOTE — Patient Instructions (Signed)
Dehydration, Adult Dehydration is when there is not enough fluid or water in your body. This happens when you lose more fluids than you take in. Dehydration can range from mild to very bad. It should be treated right away to keep it from getting very bad. Symptoms of mild dehydration may include:  Thirst.  Dry lips.  Slightly dry mouth.  Dry, warm skin.  Dizziness. Symptoms of moderate dehydration may include:  Very dry mouth.  Muscle cramps.  Dark pee (urine). Pee may be the color of tea.  Your body making less pee.  Your eyes making fewer tears.  Heartbeat that is uneven or faster than normal (palpitations).  Headache.  Light-headedness, especially when you stand up from sitting.  Fainting (syncope). Symptoms of very bad dehydration may include:  Changes in skin, such as: ? Cold and clammy skin. ? Blotchy (mottled) or pale skin. ? Skin that does not quickly return to normal after being lightly pinched and let go (poor skin turgor).  Changes in body fluids, such as: ? Feeling very thirsty. ? Your eyes making fewer tears. ? Not sweating when body temperature is high, such as in hot weather. ? Your body making very little pee.  Changes in vital signs, such as: ? Weak pulse. ? Pulse that is more than 100 beats a minute when you are sitting still. ? Fast breathing. ? Low blood pressure.  Other changes, such as: ? Sunken eyes. ? Cold hands and feet. ? Confusion. ? Lack of energy (lethargy). ? Trouble waking up from sleep. ? Short-term weight loss. ? Unconsciousness. Follow these instructions at home:  If told by your doctor, drink an ORS: ? Make an ORS by using instructions on the package. ? Start by drinking small amounts, about  cup (120 mL) every 5-10 minutes. ? Slowly drink more until you have had the amount that your doctor said to have.  Drink enough clear fluid to keep your pee clear or pale yellow. If you were told to drink an ORS, finish the ORS  first, then start slowly drinking clear fluids. Drink fluids such as: ? Water. Do not drink only water by itself. Doing that can make the salt (sodium) level in your body get too low (hyponatremia). ? Ice chips. ? Fruit juice that you have added water to (diluted). ? Low-calorie sports drinks.  Avoid: ? Alcohol. ? Drinks that have a lot of sugar. These include high-calorie sports drinks, fruit juice that does not have water added, and soda. ? Caffeine. ? Foods that are greasy or have a lot of fat or sugar.  Take over-the-counter and prescription medicines only as told by your doctor.  Do not take salt tablets. Doing that can make the salt level in your body get too high (hypernatremia).  Eat foods that have minerals (electrolytes). Examples include bananas, oranges, potatoes, tomatoes, and spinach.  Keep all follow-up visits as told by your doctor. This is important. Contact a doctor if:  You have belly (abdominal) pain that: ? Gets worse. ? Stays in one area (localizes).  You have a rash.  You have a stiff neck.  You get angry or annoyed more easily than normal (irritability).  You are more sleepy than normal.  You have a harder time waking up than normal.  You feel: ? Weak. ? Dizzy. ? Very thirsty.  You have peed (urinated) only a small amount of very dark pee during 6-8 hours. Get help right away if:  You have symptoms of   very bad dehydration.  You cannot drink fluids without throwing up (vomiting).  Your symptoms get worse with treatment.  You have a fever.  You have a very bad headache.  You are throwing up or having watery poop (diarrhea) and it: ? Gets worse. ? Does not go away.  You have blood or something green (bile) in your throw-up.  You have blood in your poop (stool). This may cause poop to look black and tarry.  You have not peed in 6-8 hours.  You pass out (faint).  Your heart rate when you are sitting still is more than 100 beats a  minute.  You have trouble breathing. This information is not intended to replace advice given to you by your health care provider. Make sure you discuss any questions you have with your health care provider. Document Released: 06/18/2009 Document Revised: 03/11/2016 Document Reviewed: 10/16/2015 Elsevier Interactive Patient Education  2018 [employer]  

## 2017-07-18 ENCOUNTER — Ambulatory Visit: Payer: BLUE CROSS/BLUE SHIELD | Admitting: Physical Therapy

## 2017-07-18 DIAGNOSIS — Z483 Aftercare following surgery for neoplasm: Secondary | ICD-10-CM | POA: Diagnosis not present

## 2017-07-18 DIAGNOSIS — M25611 Stiffness of right shoulder, not elsewhere classified: Secondary | ICD-10-CM

## 2017-07-18 DIAGNOSIS — R293 Abnormal posture: Secondary | ICD-10-CM

## 2017-07-18 DIAGNOSIS — M25511 Pain in right shoulder: Secondary | ICD-10-CM | POA: Diagnosis not present

## 2017-07-18 DIAGNOSIS — M25512 Pain in left shoulder: Secondary | ICD-10-CM | POA: Diagnosis not present

## 2017-07-18 DIAGNOSIS — M25612 Stiffness of left shoulder, not elsewhere classified: Secondary | ICD-10-CM | POA: Diagnosis not present

## 2017-07-18 NOTE — Therapy (Signed)
[place], [place], [address] Phone: [phone]   Fax:  [phone]  Physical Therapy Treatment  Patient Details  Name: GEORGIANNE GRITZ MRN: [id number] Date of Birth: 1970/12/10 Referring Provider: Dr. Burr Medico    Encounter Date: 07/18/2017  PT End of Session - 07/18/17 1706    Visit Number  11    Number of Visits  17    Date for PT Re-Evaluation  07/25/17    PT Start Time  1600    PT Stop Time  1645    PT Time Calculation (min)  45 min    Activity Tolerance  Patient tolerated treatment well    Behavior During Therapy  [facility] for tasks assessed/performed       Past Medical History:  Diagnosis Date  . Anemia   . Anxiety   . Cancer ([place]) 03/2017   left breast cancer  . Chronic ITP (idiopathic thrombocytopenia) (HCC)   . Family history of breast cancer   . Family history of ovarian cancer   . Headache    last migraine...9/24  . Heart murmur    at birth.  none now  . MTHFR mutation [facility])     Past Surgical History:  Procedure Laterality Date  . BREAST SURGERY    . MASTECTOMY    . WISDOM TOOTH EXTRACTION      There were no vitals filed for this visit.  Subjective Assessment - 07/18/17 1605    Subjective  Pt has a little bit of headache today that may be weather related  And she feels some cording in her left armpit .    Pertinent History  Patient was diagnosed on 03/21/17 with left invasive lobular carcinoma breast cancer. It measures 2.1 cm and is located in the upper outer quadrant. She also has 6 abnormal axillary lymph nodes and a node was biopsied and found to be positive for carcinoma.   04/27/2017 .bilateral mastecomy with ALND ( 21 nodes removed) with immedicate expanders. She is planning to have chemo, then have implant surgery and hysterectomy and then have radiation     Patient Stated Goals  to get motion in arm back, return to exercise, and learn what to do to prevent lymphedema     Currently in Pain?  Yes    Pain Score  4     Pain Location  Axilla                      [facility] Adult PT Treatment/Exercise - 07/18/17 0001      Shoulder Exercises: Pulleys   Flexion  2 minutes    ABduction  2 minutes      Shoulder Exercises: ROM/Strengthening   Ball on Wall  yellow ball up the wall x 10 reps       Manual Therapy   Manual Therapy  Soft tissue mobilization;Myofascial release;Scapular mobilization;Manual Lymphatic Drainage (MLD)    Manual therapy comments  used inflatable ball for self myofascial release to right post axillary trigger point area with modest results.  Pt will try a tennis ball at home     Myofascial Release  to cording in left axilla and to right sidebody and axilla     Scapular Mobilization  in left sidelying to right scapula for pain relief.  Scapula very tight to rib cage     Manual Lymphatic Drainage (MLD)  brieftly to left arm and axilla     Passive ROM  PROM to  left and right shoulders in supine and sidelying  in direction of flexion, abduction providing pressure to pec to help release                Short Term Clinic Goals - 07/05/17 1154      CC Short Term Goal  #1   Title  pt will be knowledgable of lymphedema risk reduction practices     Time  4    Period  Weeks    Status  On-going      CC Short Term Goal  #2   Title  Pt will be independent in postural and shoulder stretching exercises    Status  Achieved        Breast Clinic Goals - 03/29/17 1232      Patient will be able to verbalize understanding of pertinent lymphedema risk reduction practices relevant to her diagnosis specifically related to skin care.   Time  1    Period  Days    Status  Achieved      Patient will be able to return demonstrate and/or verbalize understanding of the post-op home exercise program related to regaining shoulder range of motion.   Time  1    Period  Days    Status  Achieved      Patient will be able to verbalize  understanding of the importance of attending the postoperative After Breast Cancer Class for further lymphedema risk reduction education and therapeutic exercise.   Time  1    Period  Days    Status  Achieved       Long Term Clinic Goals - 07/05/17 1155      CC Long Term Goal  #1   Title  Pt will have 140 degrees of left shoulder abduction so that she can received radiation treatment     Baseline  05/25/2017: 60 degrees , 07/05/2017  140 degrees     Status  Achieved      CC Long Term Goal  #2   Title  Pt will ahve 150 degrees of right shoulder abduction so that she can perform household activities without difficulty     Baseline  05/25/2017: 95 degrees , 155 degrees on 07/05/2017    Status  Achieved      CC Long Term Goal  #3   Title  Pt will be independent in strength ABC program with knowledge of how to safely increase her intensity of exercise    Status  On-going      CC Long Term Goal  #4   Title  Pt will decrease Quick DASH score to < 20 indicating a function improvment of arms     Baseline  52.27 on 05/25/2017     Status  On-going         Plan - 07/18/17 1707    Clinical Impression Statement  Pt continues to make gradual improvemnts.  She has tightness with trigger point at right posterior axilla and tight scapula  and cording in left axilla     Clinical Impairments Affecting Rehab Potential  21 nodes removed in left axilla     PT Next Visit Plan  Teach strength ABC prorgram and reviewe lymphedema risk reduction  for goal assessment ( pt has handout at home my need brief instruction as she is familiar with exercise Myofascial PROM and soft tissue work as needed Review strength ABC prorgram and  Add exercise as needed        Patient will benefit  from skilled therapeutic intervention in order to improve the following deficits and impairments:  Decreased range of motion, Impaired UE functional use, Pain, Decreased knowledge of precautions, Postural dysfunction, Decreased  knowledge of use of DME, Decreased strength, Increased fascial restricitons, Impaired perceived functional ability  Visit Diagnosis: Stiffness of left shoulder, not elsewhere classified  Acute pain of left shoulder  Abnormal posture  Pain of right shoulder joint on movement  Stiffness of right shoulder, not elsewhere classified  Aftercare following surgery for neoplasm     Problem List Patient Active Problem List   Diagnosis Date Noted  . Breast cancer ([place]) 04/27/2017  . Genetic testing 04/17/2017  . BRCA2 gene mutation positive 04/17/2017  . Family history of breast cancer   . Family history of ovarian cancer   . Breast cancer metastasized to axillary lymph node, left ([place]) 03/29/2017  . Cough 03/10/2014  . Esophagitis 03/10/2014  . Chronic ITP (idiopathic thrombocytopenia) (HCC) 03/10/2014  . ALLERGIC REACTION, ACUTE 07/22/2008   Donato Heinz. Owens Shark PT  Norwood Levo 07/18/2017, 5:14 PM  [place], [place], [address] Phone: [phone]   Fax:  [phone]  Name: LORILEE CAFARELLA MRN: [id number] Date of Birth: 1970/12/07

## 2017-07-20 ENCOUNTER — Encounter: Payer: Self-pay | Admitting: Physical Therapy

## 2017-07-20 ENCOUNTER — Ambulatory Visit: Payer: BLUE CROSS/BLUE SHIELD | Admitting: Physical Therapy

## 2017-07-20 DIAGNOSIS — R293 Abnormal posture: Secondary | ICD-10-CM

## 2017-07-20 DIAGNOSIS — M25511 Pain in right shoulder: Secondary | ICD-10-CM | POA: Diagnosis not present

## 2017-07-20 DIAGNOSIS — Z483 Aftercare following surgery for neoplasm: Secondary | ICD-10-CM | POA: Diagnosis not present

## 2017-07-20 DIAGNOSIS — M25512 Pain in left shoulder: Secondary | ICD-10-CM

## 2017-07-20 DIAGNOSIS — M25611 Stiffness of right shoulder, not elsewhere classified: Secondary | ICD-10-CM

## 2017-07-20 DIAGNOSIS — M25612 Stiffness of left shoulder, not elsewhere classified: Secondary | ICD-10-CM | POA: Diagnosis not present

## 2017-07-20 NOTE — Therapy (Addendum)
[place], [place], [address] Phone: [phone]   Fax:  [phone]  Physical Therapy Treatment  Patient Details  Name: Madison Savage MRN: [id number] Date of Birth: Oct 07, 1970 Referring Provider: Dr. Burr Medico    Encounter Date: 07/20/2017  PT End of Session - 07/20/17 1738    Visit Number  12    Number of Visits  17    Date for PT Re-Evaluation  07/25/17    PT Start Time  1603    PT Stop Time  1650    PT Time Calculation (min)  47 min    Activity Tolerance  Patient tolerated treatment well    Behavior During Therapy  [facility] for tasks assessed/performed       Past Medical History:  Diagnosis Date  . Anemia   . Anxiety   . Cancer ([place]) 03/2017   left breast cancer  . Chronic ITP (idiopathic thrombocytopenia) (HCC)   . Family history of breast cancer   . Family history of ovarian cancer   . Headache    last migraine...9/24  . Heart murmur    at birth.  none now  . MTHFR mutation [facility])     Past Surgical History:  Procedure Laterality Date  . AXILLARY LYMPH NODE DISSECTION Left 04/27/2017   Procedure: AXILLARY LYMPH NODE DISSECTION;  Surgeon: [name], MD;  Location: [facility];  Service: General;  Laterality: Left;  . BILATERAL TOTAL MASTECTOMY WITH AXILLARY LYMPH NODE DISSECTION Bilateral 04/27/2017   Procedure: SKIN SPARING MASTECTOMY;  Surgeon: [name], MD;  Location: [place];  Service: General;  Laterality: Bilateral;  . BREAST RECONSTRUCTION WITH PLACEMENT OF TISSUE EXPANDER AND FLEX HD (ACELLULAR HYDRATED DERMIS) Bilateral 04/27/2017   Procedure: BILATERAL BREAST RECONSTRUCTION WITH PLACEMENT OF TISSUE EXPANDER AND FLEX HD (ACELLULAR HYDRATED DERMIS);  Surgeon: Wallace Going, DO;  Location: [place];  Service: Plastics;  Laterality: Bilateral;  . BREAST SURGERY    . MASTECTOMY    . PORTACATH PLACEMENT Right 06/01/2017   Procedure: INSERTION PORT-A-CATH RIGHT SUBCLAVIAN;  Surgeon: [name], MD;  Location: [place];  Service: General;  Laterality: Right;  . WISDOM TOOTH EXTRACTION      There were no vitals filed for this visit.  Subjective Assessment - 07/20/17 1713    Subjective  Pt is not feeling well today.  She has a little sore throat She hopes she will be able to get her chemo tomorrow.    Pertinent History  Patient was diagnosed on 03/21/17 with left invasive lobular carcinoma breast cancer. It measures 2.1 cm and is located in the upper outer quadrant. She also has 6 abnormal axillary lymph nodes and a node was biopsied and found to be positive for carcinoma.   04/27/2017 .bilateral mastecomy with ALND ( 21 nodes removed) with immedicate expanders. She is planning to have chemo, then have implant surgery and hysterectomy and then have radiation     Patient Stated Goals  to get motion in arm back, return to exercise, and learn what to do to prevent lymphedema     Currently in Pain?  Yes    Pain Score  -- did not rate     Pain Location  Axilla    Pain Orientation  Right    Pain Descriptors / Indicators  Aching;Tightness    Pain Type  Acute pain    Pain Onset  1 to 4 weeks ago  self myofascial release with tennis ball to right axillary and periscapular trigger points.       PT Education - 07/20/17 1737    Education provided  Yes    Education Details  lymphedema risk reduction     Person(s) Educated  Patient    Methods  Explanation    Comprehension  Verbalized understanding        Short Term Clinic Goals - 07/05/17 1154      CC Short Term Goal  #1   Title  pt will be knowledgable of lymphedema risk reduction practices     Time  4    Period  Weeks    Status  On-going      CC Short Term Goal  #2   Title  Pt will be independent in postural and shoulder stretching exercises    Status  Achieved        Breast Clinic Goals - 03/29/17 1232       Patient will be able to verbalize understanding of pertinent lymphedema risk reduction practices relevant to her diagnosis specifically related to skin care.   Time  1    Period  Days    Status  Achieved      Patient will be able to return demonstrate and/or verbalize understanding of the post-op home exercise program related to regaining shoulder range of motion.   Time  1    Period  Days    Status  Achieved      Patient will be able to verbalize understanding of the importance of attending the postoperative After Breast Cancer Class for further lymphedema risk reduction education and therapeutic exercise.   Time  1    Period  Days    Status  Achieved       Long Term Clinic Goals - 07/20/17 1739      CC Long Term Goal  #1   Title  Pt will have 140 degrees of left shoulder abduction so that she can received radiation treatment     Baseline  05/25/2017: 60 degrees , 07/05/2017  140 degrees     Status  On-going      CC Long Term Goal  #2   Title  Pt will ahve 150 degrees of right shoulder abduction so that she can perform household activities without difficulty     Baseline  05/25/2017: 95 degrees , 155 degrees on 07/05/2017    Status  On-going      CC Long Term Goal  #3   Title  Pt will be independent in strength ABC program with knowledge of how to safely increase her intensity of exercise    Time  6    Period  Weeks    Status  New      CC Long Term Goal  #4   Title  Pt will decrease Quick DASH score to < 20 indicating a function improvment of arms     Baseline  52.27 on 05/25/2017     Time  6    Period  Weeks    Status  On-going      CC Long Term Goal  #5   Title   will have a report a reduction in cording left axilla by 75%    Time  6    Period  Weeks    Status  New      CC Long Term Goal  #6   Title  Pt will be able to return to plank and side  crunch exercise that she wants to do without pain     Time  6    Period  Weeks    Status  New      Additional Goals    Additional Goals  Yes         Plan - 07/20/17 1700    Clinical Impression Statement  Pt continues to improve and has made gains in shoulder ROM.  she still has tightness in back with decreased mobility in bilateral scapulae. She wants to continue with PT for next month to increase scapular mobility and strength and be able to return to an exercise program that has been a big part of her lift     Rehab Potential  Good    Clinical Impairments Affecting Rehab Potential  21 nodes removed in left axilla     PT Treatment/Interventions  Therapeutic exercise;Patient/family education;ADLs/Self Care Home Management;Taping;DME Instruction;Dry needling;Functional mobility training;Therapeutic activities;Manual lymph drainage;Manual techniques;Passive range of motion    PT Next Visit Plan  continue teaching Strength ABC program . Focus on activities to increase scapular mobility with manual work as needed to help with myofascial release        Patient will benefit from skilled therapeutic intervention in order to improve the following deficits and impairments:  Decreased range of motion, Impaired UE functional use, Pain, Decreased knowledge of precautions, Postural dysfunction, Decreased knowledge of use of DME, Decreased strength, Increased fascial restricitons, Impaired perceived functional ability  Visit Diagnosis: Stiffness of left shoulder, not elsewhere classified  Acute pain of left shoulder  Abnormal posture  Pain of right shoulder joint on movement  Stiffness of right shoulder, not elsewhere classified  Aftercare following surgery for neoplasm     Problem List Patient Active Problem List   Diagnosis Date Noted  . Breast cancer ([place]) 04/27/2017  . Genetic testing 04/17/2017  . BRCA2 gene mutation positive 04/17/2017  . Family history of breast cancer   . Family history of ovarian cancer   . Breast cancer metastasized to axillary lymph node, left ([place]) 03/29/2017  . Cough  03/10/2014  . Esophagitis 03/10/2014  . Chronic ITP (idiopathic thrombocytopenia) (HCC) 03/10/2014  . ALLERGIC REACTION, ACUTE 07/22/2008   Donato Heinz. Owens Shark PT  Norwood Levo 07/21/2017, 9:10 AM  [place], [place], [address] Phone: [phone]   Fax:  [phone]  Name: Madison Savage MRN: [id number] Date of Birth: 1970-11-21

## 2017-07-20 NOTE — Progress Notes (Signed)
[place]  [phone] [phone]  Clinic Follow up Note   Patient Care Team: Dorena Cookey, MD as PCP - [name], MD as Consulting Physician (General Surgery) [name], MD as Consulting Physician (Hematology) Kyung Rudd, MD as Consulting Physician (Radiation Oncology) Brien Few, MD as Consulting Physician (Obstetrics and Gynecology) 07/21/2017  CHIEF COMPLAINTS:  Follow up for Breast cancer metastasized to axillary lymph node, left   Oncology History   Cancer Staging Breast cancer metastasized to axillary lymph node, left [facility]) Staging form: Breast, AJCC 8th Edition - Clinical stage from 03/22/2017: Stage Unknown (cTX, cN1, cM0, GX, ER: Positive, PR: Positive, HER2: Negative) - Signed by [name], MD on 03/29/2017 - Pathologic stage from 04/27/2017: Stage IB (pT1c, pN2a, cM0, G2, ER: Positive, PR: Positive, HER2: Negative) - Signed by [name], MD on 05/25/2017 \     Breast cancer metastasized to axillary lymph node, left ([facility])   03/21/2017 Mammogram    Diagnostic mammogram bilateral 03/21/17 IMPRESSION: INCOMPLETE - ADDITIONAL IMAGING EVALUATION NEEDED 1. The 0.6 cm oval mass in the right breast at 11 o'clock middle depth is indeterminate. An ultrasound is recommended.  2. The 0.5cm round focal asymmetry in the left breast central to the nipple middle depth is indeterminate. An ultrasound is recommended.  3. Ultrasound of the palpable abnormalities in the axilla and far left lateral breast is recommended.  4. Multiple clusters of pleomorphic calcifications in the left upper outer breast anterior depth spanning a 2.5 cm area are highly suggestive of malignancy. A stereotactic biopsy is recommended.        03/21/2017 Imaging    US Breast bilateral 03/21/17 IMPRESSION: HIGHLY SUGGESTIVE OF MALIGNANCY 1. Six distinct abnormal left axillary nodes, 2 of which correspond to the palpable lumps felt by the patient. Findings  concerning for metastatic adenopathy. Ultrasound guided biopsy of one of these nodes is recommended.  2. Small 5 mm palpable, oval mass in the left breast 3:00 areolar margin is at an intermediate suspicion. Ultrasound guided biopsy is recommended.  3. Isoechoic 8 mm mass in the right breast 10:00 5 cm from the nipple is also an intermediate suspicion for malignancy, and ultrasound guided biopsy is recommended.  4. Stereotactic guided biopsy for the highly suspicious calcifications in the left breast also recommended as detailed in the mammography report.        03/22/2017 Initial Biopsy    Diagnosis 03/22/17 Lymph node, needle/core biopsy, left axillary node -METASTATIC CARCINOMA, SEE COMMENT Microscopic Comment      03/22/2017 Receptors her2    Lymph node biopsy ER 95% positive, strong staining, PR 5% positive, weak staining, HER-2 negative, with HER2/CEP17 ratio 1.23 and copy #3.95.      03/27/2017 Pathology Results    Diagnosis 03/27/17 1. Breast, left, needle core biopsy -FIBROADENOMATOID NODULE WITH CALCIFICATIONS 2.Breast, left, needle core biopsy -DUCTAL CARCINOMA IN SITU, HIGH GRADE -SEE COMMENT  3. Breast, right, needle, core biopsy -FIBROADENOMA -NO MALIGNANCY IDENTIFIED       03/29/2017 Initial Diagnosis    Breast cancer metastasized to axillary lymph node, left ([place])      04/07/2017 Imaging    CT CAP 04/07/17 IMPRESSION: 1. Several prominent left axillary lymph nodes which may correspond to biopsy proven axillary lymph node metastasis. 2. No thoracic adenopathy identified or evidence of distant metastatic disease.      04/07/2017 Imaging    Bone whole body scan 04/07/17 IMPRESSION: Negative for evidence of osseous metastatic disease.  04/17/2017 Genetic Testing    BRCA2 c.[id number] (p.Glu260Serfs*15) pathogenic mutation identified on the 9 gene STAT panel.  The STAT Breast cancer panel offered by [facility] includes sequencing and rearrangement analysis for the  following 9 genes:  ATM, BRCA1, BRCA2, CDH1, CHEK2, PALB2, PTEN, STK11 and TP53.   The report date is April 17, 2017.  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  The Hereditary Gene Panel offered by [facility] includes sequencing and/or deletion duplication testing of the following 46 genes: APC, ATM, AXIN2, BARD1, BMPR1A, BRCA1, BRCA2, BRIP1, CDH1, CDKN2A (p14ARF), CDKN2A (p16INK4a), CHEK2, CTNNA1, DICER1, EPCAM (Deletion/duplication testing only), GREM1 (promoter region deletion/duplication testing only), KIT, MEN1, MLH1, MSH2, MSH3, MSH6, MUTYH, NBN, NF1, NHTL1, PALB2, PDGFRA, PMS2, POLD1, POLE, PTEN, RAD50, RAD51C, RAD51D, SDHB, SDHC, SDHD, SMAD4, SMARCA4. STK11, TP53, TSC1, TSC2, and VHL.  The following genes were evaluated for sequence changes only: SDHA and HOXB13 c.251G>A variant only.  The report date is April 29, 2017.       04/27/2017 Surgery    Surgey 04/27/17 BILATERAL SKIN SPARING MASTECTOMY and LEFT AXILLARY LYMPH NODE and BILATERAL BREAST RECONSTRUCTION WITH PLACEMENT OF TISSUE EXPANDER AND FLEX HD (ACELLULAR HYDRATED DERMIS) Bilateral BY Dr. Barry Dienes and Dr. Marla Roe       04/27/2017 Pathology Results     Diagnosis  04/27/17 1. Breast, simple mastectomy, Right - FIBROCYSTIC CHANGES WITH ADENOSIS. - USUAL DUCTAL HYPERPLASIA. - HEALING BIOPSY SITE. - THERE IS NO EVIDENCE OF MALIGNANCY. 2. Breast, simple mastectomy, Left - INVASIVE DUCTAL CARCINOMA, GRADE II/III, SPANNING 1.2 CM. - DUCTAL CARCINOMA IN SITU WITH CALCIFICATIONS, HIGH GRADE. - INVASIVE DUCTAL CARCINOMA IS FOCALLY PRESENT AT THE ANTERIOR MARGIN. - DUCTAL CARCINOMA IN SITU IS BROADLY 0.1 CM TO THE POSTERIOR MARGIN. - SEE ONCOLOGY TABLE BELOW. 3. Lymph nodes, regional resection, Left axillary - METASTATIC CARCINOMA IN 8 OF 21 LYMPH NODES (8/21). - SEE COMMENT      06/09/2017 -  Chemotherapy    Adriamycin  and Cytoxan every 2 weeks, for 4 cycles starting 06/09/17, followed by weekly Taxol and carboplatin for 12 weeks.        HISTORY OF PRESENTING ILLNESS: 03/29/17 Madison Savage 46 y.o. female is here because of newly diagnosed Breast cancer metastasized to axillary lymph node, left. She presents to Breast Clinic today with her husband. She felt the lump herself June 22nd. It was soft and "squishy" and it moved. First in the left breast and then four days latter in the left axilla. She feels they have gotten smaller.   In the past she was diagnosed with being ITP positive. Period has been irregular more since her last miscarriage in 2015. She has MTHFR gene. In pregnancy her plt counts have gone low. She has not seen homologist for this. All 3 children were vaginal births.  Today she reports that with the lumps she feels a dull ache that comes and goes. She has not noticed any nipple or skin change. She had not had a mammogram since she was 15. Her PCP was going to set her up with a mammogram last year. Dr. Ronita Hipps is her OB and Dr. Sherren Mocha is her current PCP. She feels no change in appetite or weight loss. She quit smoking 3 weeks ago. She works out Scientist, product/process development. She reports her last attempt at the MRI, the contrast made her feel like she could not breathe, her head was pounding and she felt hot all over and she  was shaking. The trouble breathing only lasted while she was in the MRI.  Husband shared concern with insurance and financial. And referrals to second opinions.    GYN HISTORY  Menarchal: 16 LMP: 03/05/2017 Contraceptive: none HRT: N/A G7P3: - 3 miscarriages, 1 abortion    CURRENT THERAPY: Adriamycin and Cytoxan every 2 weeks, for 4 cycles starting 06/09/17, followed by weekly Abraxane and carboplatin for 12 weeks starting 08/04/17  INTERVAL HISTORY:  Madison Savage is here for a follow up and last cycle AC. She presents to the clinic today excited for her last AC. She notes the cold cap  continues to do well for her. She does have shedding but keeping her hair up. She tolerated her previous cycle very well. She wants to continue with PT through December. She did get IV fluids after last treatment. She has read up on Abraxane and is willing to do it than Taxol. She is tolerating the tissue expanders but her nerves are returning to normal and she feels them more. She would like to due hysterectomy after her breast reconstruction. She notes mild cold symptoms after her husband was sick with dry cough and no fever.     MEDICAL HISTORY:  Past Medical History:  Diagnosis Date  . Anemia   . Anxiety   . Cancer ([place]) 03/2017   left breast cancer  . Chronic ITP (idiopathic thrombocytopenia) ([facility])   . Family history of breast cancer   . Family history of ovarian cancer   . Headache    last migraine...9/24  . Heart murmur    at birth.  none now  . MTHFR mutation [facility])     SURGICAL HISTORY: Past Surgical History:  Procedure Laterality Date  . AXILLARY LYMPH NODE DISSECTION Left 04/27/2017   Performed by [name], MD at [facility]  . BILATERAL BREAST RECONSTRUCTION WITH PLACEMENT OF TISSUE EXPANDER AND FLEX HD (ACELLULAR HYDRATED DERMIS) Bilateral 04/27/2017   Performed by Wallace Going, DO at [facility]  . BREAST SURGERY    . INSERTION PORT-A-CATH RIGHT SUBCLAVIAN Right 06/01/2017   Performed by [name], MD at [place]  . MASTECTOMY    . SKIN SPARING MASTECTOMY Bilateral 04/27/2017   Performed by [name], MD at [facility]  . WISDOM TOOTH EXTRACTION      SOCIAL HISTORY: Social History   Socioeconomic History  . Marital status: Married    Spouse name: Not on file  . Number of children: 3  . Years of education: Not on file  . Highest education level: Not on file  Social Needs  . Financial resource strain: Not on file  . Food insecurity - worry: Not on file  . Food insecurity - inability: Not on file  .  Transportation needs - medical: Not on file  . Transportation needs - non-medical: Not on file  Occupational History    Employer: UNEMPLOYED  Tobacco Use  . Smoking status: Former Smoker    Packs/day: 0.50    Years: 20.00    Pack years: 10.00    Types: Cigarettes    Last attempt to quit: 03/05/2017    Years since quitting: 0.3  . Smokeless tobacco: Never Used  Substance and Sexual Activity  . Alcohol use: No  . Drug use: No  . Sexual activity: Yes    Partners: Male    Birth control/protection: None  Other Topics Concern  . Not on file  Social History Narrative  Stay at home mom   3 children ages 54, 5, 62    FAMILY HISTORY: Family History  Problem Relation Age of Onset  . Breast cancer Paternal Aunt 32  . Ovarian cancer Paternal Aunt 40  . Cancer Paternal Grandmother        breast cancer   . Cancer Maternal Aunt        lymphoma   . Cancer Paternal Grandfather        throat cancer   . Cancer Paternal Aunt        lung cancer  . Cirrhosis Father   . AAA (abdominal aortic aneurysm) Maternal Grandfather   . Leukemia Paternal Aunt     ALLERGIES:  is allergic to clindamycin; penicillins; and codeine.  MEDICATIONS:  Current Outpatient Medications  Medication Sig Dispense Refill  . Cyanocobalamin (VITAMIN B-12 PO) Take 1 tablet by mouth daily.     . diazepam (VALIUM) 2 MG tablet Take 1 tablet (2 mg total) by mouth every 6 (six) hours as needed for anxiety or muscle spasms. 30 tablet 0  . HYDROcodone-acetaminophen (NORCO/VICODIN) 5-325 MG tablet Take 1 tablet by mouth every 4 (four) hours as needed for moderate pain. 30 tablet 0  . lidocaine-prilocaine (EMLA) cream Apply to affected area once 30 g 3  . Multiple Vitamins-Calcium (ONE-A-DAY WOMENS PO) Take 1 tablet by mouth daily.    . ondansetron (ZOFRAN) 8 MG tablet Take 1 tablet (8 mg total) by mouth 2 (two) times daily as needed. Start on the third day after chemotherapy. 30 tablet 1  . OVER THE COUNTER MEDICATION Take 1  capsule by mouth daily. Biotin/MSM    . prochlorperazine (COMPAZINE) 10 MG tablet Take 1 tablet (10 mg total) by mouth every 6 (six) hours as needed (Nausea or vomiting). 30 tablet 1  . traMADol (ULTRAM) 50 MG tablet Take 1 tablet (50 mg total) by mouth every 6 (six) hours as needed. 15 tablet 1  . vitamin C (ASCORBIC ACID) 500 MG tablet Take 2,000 mg by mouth daily.      Current Facility-Administered Medications  Medication Dose Route Frequency Provider Last Rate Last Dose  . LORazepam (ATIVAN) injection 0.5 mg  0.5 mg Intravenous Once [name], MD       Facility-Administered Medications Ordered in Other Visits  Medication Dose Route Frequency Provider Last Rate Last Dose  . sodium chloride flush (NS) 0.9 % injection 10 mL  10 mL Intravenous PRN [name], MD   10 mL at 07/21/17 1323  . sodium chloride flush (NS) 0.9 % injection 10 mL  10 mL Intracatheter PRN [name], MD        REVIEW OF SYSTEMS:   Constitutional: Denies fevers, chills or abnormal night sweats  Eyes: Denies blurriness of vision, double vision or watery eyes Ears, nose, mouth, throat, and face: Denies mucositis or sore throat Respiratory: Denies dyspnea or wheezes (+) cold symptoms, dry cough and congestion  Cardiovascular: Denies palpitation, chest discomfort or lower extremity swelling Gastrointestinal:  Denies nausea, heartburn or change in bowel habits Skin: Denies abnormal skin rashes (+) palpable boil in right axilla  Lymphatics: Denies new lymphadenopathy or easy bruising Neurological:Denies numbness, tingling or new weaknesses Behavioral/Psych: Mood is stable, no new changes  All other systems were reviewed with the patient and are negative.   PHYSICAL EXAMINATION: ECOG PERFORMANCE STATUS: 0 - Asymptomatic  Vitals:   07/21/17 1001  BP: 109/64  Pulse: 93  Resp: 18  Temp: 98.4 F (36.9 C)  Filed Weights   07/21/17 1001  Weight: 118 lb 14.4 oz (53.9 kg)     GENERAL:alert, no distress and  comfortable SKIN: skin color, texture, turgor are normal, no rashes or significant lesions (+) a very small boil in right axilla  EYES: normal, conjunctiva are pink and non-injected, sclera clear OROPHARYNX:no exudate, no erythema and lips, buccal mucosa, and tongue normal  NECK: supple, thyroid normal size, non-tender, without nodularity LYMPH:  no palpable lymphadenopathy in the cervical, axillary or inguinal LUNGS: clear to auscultation and percussion with normal breathing effort HEART: regular rate & rhythm and no murmurs and no lower extremity edema ABDOMEN:abdomen soft, non-tender and normal bowel sounds Musculoskeletal:no cyanosis of digits and no clubbing  PSYCH: alert & oriented x 3 with fluent speech NEURO: no focal motor/sensory deficits Breast: Status post bilateral mastectomy and tissue expander placements, surgical incisions have healed well, no discharge or skin erythema.   LABORATORY DATA:  I have reviewed the data as listed CBC Latest Ref Rng & Units 07/21/2017 07/07/2017 06/30/2017  WBC 3.9 - 10.3 10e3/uL 6.5 7.6 4.6  Hemoglobin 11.6 - 15.9 g/dL 12.8 12.8 11.8  Hematocrit 34.8 - 46.6 % 38.0 37.8 35.4  Platelets 145 - 400 10e3/uL 197 179 182    CMP Latest Ref Rng & Units 07/21/2017 07/07/2017 06/30/2017  Glucose 70 - 140 mg/dl 103 101 105  BUN 7.0 - 26.0 mg/dL 11.5 10.9 14.7  Creatinine 0.6 - 1.1 mg/dL 0.8 0.8 0.7  Sodium 136 - 145 mEq/L 141 142 141  Potassium 3.5 - 5.1 mEq/L 3.8 3.8 3.5  Chloride 101 - 111 mmol/L - - -  CO2 22 - 29 mEq/L '26 26 25  '$ Calcium 8.4 - 10.4 mg/dL 9.1 9.1 8.9  Total Protein 6.4 - 8.3 g/dL 6.5 6.4 6.1(L)  Total Bilirubin 0.20 - 1.20 mg/dL <0.22 <0.22 0.24  Alkaline Phos 40 - 150 U/L 91 82 111  AST 5 - 34 U/L '12 11 10  '$ ALT 0 - 55 U/L '14 11 9   '$ PATHOLOGY   Diagnosis  04/27/17 1. Breast, simple mastectomy, Right - FIBROCYSTIC CHANGES WITH ADENOSIS. - USUAL DUCTAL HYPERPLASIA. - HEALING BIOPSY SITE. - THERE IS NO EVIDENCE OF  MALIGNANCY. 2. Breast, simple mastectomy, Left - INVASIVE DUCTAL CARCINOMA, GRADE II/III, SPANNING 1.2 CM. - DUCTAL CARCINOMA IN SITU WITH CALCIFICATIONS, HIGH GRADE. - INVASIVE DUCTAL CARCINOMA IS FOCALLY PRESENT AT THE ANTERIOR MARGIN. - DUCTAL CARCINOMA IN SITU IS BROADLY 0.1 CM TO THE POSTERIOR MARGIN. - SEE ONCOLOGY TABLE BELOW. 3. Lymph nodes, regional resection, Left axillary - METASTATIC CARCINOMA IN 8 OF 21 LYMPH NODES (8/21). - SEE COMMENT. Microscopic Comment 2. BREAST, INVASIVE TUMOR Procedure: Simple mastectomy and axillary lymph node resections. Laterality: Left. Tumor Size: 1.2 cm (glass slide measurement). Histologic Type: Ductal Grade: II Tubular Differentiation: 3 Nuclear Pleomorphism: 3 Mitotic Count: 1 Ductal Carcinoma in Situ (DCIS): Present, high grade, extensive. Extent of Tumor: Confined to breast parenchyma. Margins: Invasive carcinoma, distance from closest margin: Focally present at the anterior margin. DCIS, distance from closest margin: Broadly less than 0.1 cm to the posterior margin. Regional Lymph Nodes: Number of Lymph Nodes Examined: 21 1 of 4 FINAL for Madison Savage, Madison Savage [phone]) Microscopic Comment(continued) Lymph Nodes with Macrometastases: 6 Lymph Nodes with Micrometastases: 0 Lymph Nodes with Isolated Tumor Cells: 2 Breast Prognostic Profile: [id number] Estrogen Receptor: 95%, strong. Progesterone Receptor: 5%, strong. Her2: No amplification was detected. The ratio is 1.23. Ki-67: 15%. Best tumor block for sendout testing: 2G Pathologic Stage  Classification (pTNM, AJCC 8th Edition): Primary Tumor (pT): pT1c Regional Lymph Nodes (pN): pN2a Distant Metastases (pM): pMX Comments: Grossly, there is a 2.5 cm focus of indurated tissue identified. Histologic examination of this area reveals predominately high grade ductal carcinoma in situ with calcifications. There is associated invasive ductal carcinoma, which is E-cadherin  positive, spanning 1.2 cm. (JBK:gt, 05/02/17) 3. Immunohistochemical stains for cytokeratin AE1/AE3 performed on multiple blocks on part 3 help highlight the presence of metastatic carcinoma. (JBK:gt, 05/02/17)   Diagnosis 03/27/17 1. Breast, left, needle core biopsy -FIBROADENOMATOID NODULE WITH CALCIFICATIONS 2.Breast, left, needle core biopsy -DUCTAL CARCINOMA IN SITU, HIGH GRADE -SEE COMMENT  3. Breast, right, needle, core biopsy -FIBROADENOMA -NO MALIGNANCY IDENTIFIED  Diagnosis 03/22/17 Lymph node, needle/core biopsy, left axillary node -METASTATIC CARCINOMA, SEE COMMENT Microscopic Comment Sections showed lymph node tissue mostly replaced by metastatic carcinoma characterized by sheets of epithelioid appearing cells displaying milder to moderate nuclear pleomorphism, fine chromatin, small nucleoli, and moderately abundant amphophilic to eosinophilic cyto-plasma. Immunohistochemistry staining showed positive for cytokeratin AE1/AE3, GATA-3 and estrogen receptor, with patchy positivity for cytokeratin 7 and GCDFP., Only scattered cells showed progesterone positivity. Negative for S100, melan-a, E-cadherin, cytokeratin 20, CDX-2, TTF-1 or WT-1. The findings are consistent with metastatic carcinoma of breast primary. Lack of significant e-cadherin positivity suggested lobular carcinoma.   GENETICS 04/10/17    PROCEDURES   ECHO 05/31/17 Study Conclusions - Left ventricle: The cavity size was normal. Wall thickness was   normal. Systolic function was normal. The estimated ejection   fraction was in the range of 55% to 60%. Wall motion was normal;   there were no regional wall motion abnormalities. Impressions: - Normal LV systolic and diastolic function; global longitudinal   strain - 17.9%.  RADIOGRAPHIC STUDIES: I have personally reviewed the radiological images as listed and agreed with the findings in the report.  CT CAP 04/07/17 IMPRESSION: 1. Several prominent left  axillary lymph nodes which may correspond to biopsy proven axillary lymph node metastasis. 2. No thoracic adenopathy identified or evidence of distant metastatic disease.   Bone whole body scan 04/07/17 IMPRESSION: Negative for evidence of osseous metastatic disease.  Diagnostic mammogram bilateral 03/21/17 IMPRESSION: INCOMPLETE - ADDITIONAL IMAGING EVALUATION NEEDED 1. The 0.6 cm oval mass in the right breast at 11 o'clock middle depth is indeterminate. An ultrasound is recommended.  2. The 0.5cm round focal asymmetry in the left breast central to the nipple middle depth is indeterminate. An ultrasound is recommended.  3. Ultrasound of the palpable abnormalities in the axilla and far left lateral breast is recommended.  4. Multiple clusters of pleomorphic calcifications in the left upper outer breast anterior depth spanning a 2.5 cm area are highly suggestive of malignancy. A stereotactic biopsy is recommended.   US Breast bilateral 03/21/17 IMPRESSION: HIGHLY SUGGESTIVE OF MALIGNANCY 1. Six distinct abnormal left axillary nodes, 2 of which correspond to the palpable lumps felt by the patient. Findings concerning for metastatic adenopathy. Ultrasound guided biopsy of one of these nodes is recommended.  2. Small 5 mm palpable, oval mass in the left breast 3:00 areolar margin is at an intermediate suspicion. Ultrasound guided biopsy is recommended.  3. Isoechoic 8 mm mass in the right breast 10:00 5 cm from the nipple is also an intermediate suspicion for malignancy, and ultrasound guided biopsy is recommended.  4. Stereotactic guided biopsy for the highly suspicious calcifications in the left breast also recommended as detailed in the mammography report.    ASSESSMENT & PLAN:  Madison  KADI Savage is a 46 y.o. perimenopausal female with a history of Chronic ITP and MTHFR mutation, presented with a palpable left axillary adenopathy.  1. Left breast cancer with metastasized to axillary lymph  nodes, invasive ductal carcinoma, pT1cN2aM0, stage IB, ER+/PR +/HER2 -, G2 -I previously reviewed her surgical pathology findings with patient and her husband in details -Due to her BRCA2 mutation, she underwent bilateral mastectomy and left axillary lymph node dissection. She had a 1.2 cm left breast invasive ductal carcinoma, with 8 positive lymph nodes. Surgical margins were negative. -We discussed the risk of cancer recurrence after surgery. Due to her multiple positive lymph nodes, young age, she has high risk for recurrence.  -I recommended adjuvant chemotherapy to reduce her risk of recurrence. I recommend standard dose dense Adriamycin and Cytoxan every 2 weeks, for 4 cycles, followed by weekly Taxol or Abraxane for 12 weeks. Due to her BRCA2 mutation, she probably would benefit from carboplatin also, I plan to add [place] to weekly Taxol. -Her Baseline 9/26/148 ECHO was normal -She started Adjuvant AC on 06/09/17, tolerating well so far, some fatigue, improves with IVF on week 2. Pt has been using Cold cap and it has been working for her thus far.  -I previously discussed the option of Taxol vs Abraxane for her weekly treatment for 12 weeks. I provided reading material to patient.  -I again discussed adding Carboplatin to her weekly treatment given her 8/21 positive lymph nodes and BRCA2 mutation.  -She agreed to weekly Abraxane and Carboplatin. Plan to start 08/04/17. I reviewed side effects with her. She will continue cold cap to prevent significant hair loss.  -Labs reviewed and adequate to proceed with last cycle AC today. We will continue with IV Fluids for each treatment.  -F/u in 2 weeks    2. Chronic ITP  -Discovered in her Last pregnancy, with platelet count dropped to between 50,000 and 60,000, recovered, her platelet counts is around 120-150K lately  -She does not follow up with hematologist  -I'm concerned her platelet counts will drop significantly with chemotherapy, she may need  steroids or additional treatment for her ITP. -Her platelets have overall remained stable while on chemotherapy.   3. 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 -She has had bilateral mastectomy, due to the high-risk of breast cancer -She planned to have hysterectomy and BSO after she completes chemo, radiation and breast reconstruction -Patient has met our genetic counselor Mrs. Florene Glen, BRCA2 G mutation screening in her children and siblings were recommended.  4. Chest pain, secondary to [facility] and Tissue Expander -Managed with Norco as needed, much better now  -Manageable, continue monitoring   5. Cold symptoms  afebrile, If she develops a fever I encouraged her to contact us -I suggest OTC medication to help resolve symptoms.  -I suggest flu vaccination after symptoms resolve.    PLAN:  -Labs adequate to proceed with last cycle AC -Lab, flush, weekly carbo and abraxane X6, starting in 2 weeks  -F/u in 2, 4 and 6 weeks  -2hr IVF on 11/24  -flu shot on next visit    No orders of the defined types were placed in this encounter.   All questions were  answered. The patient knows to call the clinic with any problems, questions or concerns. I spent 20 minutes counseling the patient face to face. The total time spent in the appointment was 30 minutes and more than 50% was on counseling.     [name], MD 07/21/2017    This document serves as a record of services personally performed by [name], MD. It was created on her behalf by Joslyn Devon, a trained medical scribe. The creation of this record is based on the scribe's personal  observations and the provider's statements to them.    I have reviewed the above documentation for accuracy and completeness, and I agree with the above.

## 2017-07-21 ENCOUNTER — Encounter: Payer: Self-pay | Admitting: *Deleted

## 2017-07-21 ENCOUNTER — Ambulatory Visit (HOSPITAL_BASED_OUTPATIENT_CLINIC_OR_DEPARTMENT_OTHER): Payer: BLUE CROSS/BLUE SHIELD

## 2017-07-21 ENCOUNTER — Encounter: Payer: Self-pay | Admitting: Hematology

## 2017-07-21 ENCOUNTER — Ambulatory Visit (HOSPITAL_BASED_OUTPATIENT_CLINIC_OR_DEPARTMENT_OTHER): Payer: BLUE CROSS/BLUE SHIELD | Admitting: Hematology

## 2017-07-21 ENCOUNTER — Other Ambulatory Visit (HOSPITAL_BASED_OUTPATIENT_CLINIC_OR_DEPARTMENT_OTHER): Payer: BLUE CROSS/BLUE SHIELD

## 2017-07-21 ENCOUNTER — Ambulatory Visit: Payer: BLUE CROSS/BLUE SHIELD

## 2017-07-21 VITALS — BP 109/64 | HR 93 | Temp 98.4°F | Resp 18 | Ht 67.5 in | Wt 118.9 lb

## 2017-07-21 DIAGNOSIS — Z95828 Presence of other vascular implants and grafts: Secondary | ICD-10-CM

## 2017-07-21 DIAGNOSIS — D693 Immune thrombocytopenic purpura: Secondary | ICD-10-CM

## 2017-07-21 DIAGNOSIS — R0789 Other chest pain: Secondary | ICD-10-CM

## 2017-07-21 DIAGNOSIS — Z5189 Encounter for other specified aftercare: Secondary | ICD-10-CM | POA: Diagnosis not present

## 2017-07-21 DIAGNOSIS — Z1501 Genetic susceptibility to malignant neoplasm of breast: Secondary | ICD-10-CM

## 2017-07-21 DIAGNOSIS — C773 Secondary and unspecified malignant neoplasm of axilla and upper limb lymph nodes: Secondary | ICD-10-CM

## 2017-07-21 DIAGNOSIS — Z170 Estrogen receptor positive status [ER+]: Secondary | ICD-10-CM

## 2017-07-21 DIAGNOSIS — C50912 Malignant neoplasm of unspecified site of left female breast: Secondary | ICD-10-CM | POA: Diagnosis not present

## 2017-07-21 DIAGNOSIS — Z1509 Genetic susceptibility to other malignant neoplasm: Secondary | ICD-10-CM

## 2017-07-21 DIAGNOSIS — J00 Acute nasopharyngitis [common cold]: Secondary | ICD-10-CM

## 2017-07-21 DIAGNOSIS — Z5111 Encounter for antineoplastic chemotherapy: Secondary | ICD-10-CM | POA: Diagnosis not present

## 2017-07-21 LAB — COMPREHENSIVE METABOLIC PANEL
ALT: 14 U/L (ref 0–55)
AST: 12 U/L (ref 5–34)
Albumin: 3.8 g/dL (ref 3.5–5.0)
Alkaline Phosphatase: 91 U/L (ref 40–150)
Anion Gap: 7 mEq/L (ref 3–11)
BUN: 11.5 mg/dL (ref 7.0–26.0)
CO2: 26 mEq/L (ref 22–29)
Calcium: 9.1 mg/dL (ref 8.4–10.4)
Chloride: 108 mEq/L (ref 98–109)
Creatinine: 0.8 mg/dL (ref 0.6–1.1)
EGFR: >60 mL/min/{1.73_m2} (ref >60)
Glucose: 103 mg/dl (ref 70–140)
Potassium: 3.8 mEq/L (ref 3.5–5.1)
Sodium: 141 mEq/L (ref 136–145)
Total Bilirubin: <0.22 mg/dL (ref 0.20–1.20)
Total Protein: 6.5 g/dL (ref 6.4–8.3)

## 2017-07-21 LAB — CBC WITH DIFFERENTIAL/PLATELET
BASO%: 0.3 % (ref 0.0–2.0)
Basophils Absolute: 0 10*3/uL (ref 0.0–0.1)
EOS%: 0.2 % (ref 0.0–7.0)
Eosinophils Absolute: 0 10*3/uL (ref 0.0–0.5)
HCT: 38 % (ref 34.8–46.6)
HGB: 12.8 g/dL (ref 11.6–15.9)
LYMPH%: 11.9 % — ABNORMAL LOW (ref 14.0–49.7)
MCH: 31 pg (ref 25.1–34.0)
MCHC: 33.7 g/dL (ref 31.5–36.0)
MCV: 92.2 fL (ref 79.5–101.0)
MONO#: 0.8 10*3/uL (ref 0.1–0.9)
MONO%: 12.6 % (ref 0.0–14.0)
NEUT#: 4.9 10*3/uL (ref 1.5–6.5)
NEUT%: 75 % (ref 38.4–76.8)
Platelets: 197 10*3/uL (ref 145–400)
RBC: 4.12 10*6/uL (ref 3.70–5.45)
RDW: 15.4 % — ABNORMAL HIGH (ref 11.2–14.5)
WBC: 6.5 10*3/uL (ref 3.9–10.3)
lymph#: 0.8 10*3/uL — ABNORMAL LOW (ref 0.9–3.3)

## 2017-07-21 MED ORDER — HEPARIN SOD (PORK) LOCK FLUSH 100 UNIT/ML IV SOLN
500.0000 [IU] | Freq: Once | INTRAVENOUS | Status: AC | PRN
  Administered 2017-07-21: 500 [IU]
  Filled 2017-07-21: qty 5

## 2017-07-21 MED ORDER — SODIUM CHLORIDE 0.9% FLUSH
10.0000 mL | INTRAVENOUS | Status: DC | PRN
  Filled 2017-07-21: qty 10

## 2017-07-21 MED ORDER — DOXORUBICIN HCL CHEMO IV INJECTION 2 MG/ML
60.0000 mg/m2 | Freq: Once | INTRAVENOUS | Status: AC
  Administered 2017-07-21: 94 mg via INTRAVENOUS
  Filled 2017-07-21: qty 47

## 2017-07-21 MED ORDER — SODIUM CHLORIDE 0.9% FLUSH
10.0000 mL | INTRAVENOUS | Status: AC | PRN
  Administered 2017-07-21 (×2): 10 mL via INTRAVENOUS
  Filled 2017-07-21: qty 10

## 2017-07-21 MED ORDER — PALONOSETRON HCL INJECTION 0.25 MG/5ML
0.2500 mg | Freq: Once | INTRAVENOUS | Status: AC
  Administered 2017-07-21: 0.25 mg via INTRAVENOUS

## 2017-07-21 MED ORDER — SODIUM CHLORIDE 0.9 % IV SOLN
Freq: Once | INTRAVENOUS | Status: AC
  Administered 2017-07-21: 11:00:00 via INTRAVENOUS
  Filled 2017-07-21: qty 5

## 2017-07-21 MED ORDER — PEGFILGRASTIM 6 MG/0.6ML ~~LOC~~ PSKT
6.0000 mg | PREFILLED_SYRINGE | Freq: Once | SUBCUTANEOUS | Status: AC
  Administered 2017-07-21: 6 mg via SUBCUTANEOUS
  Filled 2017-07-21: qty 0.6

## 2017-07-21 MED ORDER — PALONOSETRON HCL INJECTION 0.25 MG/5ML
INTRAVENOUS | Status: AC
  Filled 2017-07-21: qty 5

## 2017-07-21 MED ORDER — SODIUM CHLORIDE 0.9 % IV SOLN
600.0000 mg/m2 | Freq: Once | INTRAVENOUS | Status: AC
  Administered 2017-07-21: 940 mg via INTRAVENOUS
  Filled 2017-07-21: qty 47

## 2017-07-21 MED ORDER — SODIUM CHLORIDE 0.9 % IV SOLN
Freq: Once | INTRAVENOUS | Status: AC
  Administered 2017-07-21: 11:00:00 via INTRAVENOUS

## 2017-07-21 NOTE — Patient Instructions (Signed)
Implanted Port Home Guide An implanted port is a type of central line that is placed under the skin. Central lines are used to provide IV access when treatment or nutrition needs to be given through a person's veins. Implanted ports are used for long-term IV access. An implanted port may be placed because:  You need IV medicine that would be irritating to the small veins in your hands or arms.  You need long-term IV medicines, such as antibiotics.  You need IV nutrition for a long period.  You need frequent blood draws for lab tests.  You need dialysis.  Implanted ports are usually placed in the chest area, but they can also be placed in the upper arm, the abdomen, or the leg. An implanted port has two main parts:  Reservoir. The reservoir is round and will appear as a small, raised area under your skin. The reservoir is the part where a needle is inserted to give medicines or draw blood.  Catheter. The catheter is a thin, flexible tube that extends from the reservoir. The catheter is placed into a large vein. Medicine that is inserted into the reservoir goes into the catheter and then into the vein.  How will I care for my incision site? Do not get the incision site wet. Bathe or shower as directed by your health care provider. How is my port accessed? Special steps must be taken to access the port:  Before the port is accessed, a numbing cream can be placed on the skin. This helps numb the skin over the port site.  Your health care provider uses a sterile technique to access the port. ? Your health care provider must put on a mask and sterile gloves. ? The skin over your port is cleaned carefully with an antiseptic and allowed to dry. ? The port is gently pinched between sterile gloves, and a needle is inserted into the port.  Only "non-coring" port needles should be used to access the port. Once the port is accessed, a blood return should be checked. This helps ensure that the port  is in the vein and is not clogged.  If your port needs to remain accessed for a constant infusion, a clear (transparent) bandage will be placed over the needle site. The bandage and needle will need to be changed every week, or as directed by your health care provider.  Keep the bandage covering the needle clean and dry. Do not get it wet. Follow your health care provider's instructions on how to take a shower or bath while the port is accessed.  If your port does not need to stay accessed, no bandage is needed over the port.  What is flushing? Flushing helps keep the port from getting clogged. Follow your health care provider's instructions on how and when to flush the port. Ports are usually flushed with saline solution or a medicine called heparin. The need for flushing will depend on how the port is used.  If the port is used for intermittent medicines or blood draws, the port will need to be flushed: ? After medicines have been given. ? After blood has been drawn. ? As part of routine maintenance.  If a constant infusion is running, the port may not need to be flushed.  How long will my port stay implanted? The port can stay in for as long as your health care provider thinks it is needed. When it is time for the port to come out, surgery will be   done to remove it. The procedure is similar to the one performed when the port was put in. When should I seek immediate medical care? When you have an implanted port, you should seek immediate medical care if:  You notice a bad smell coming from the incision site.  You have swelling, redness, or drainage at the incision site.  You have more swelling or pain at the port site or the surrounding area.  You have a fever that is not controlled with medicine.  This information is not intended to replace advice given to you by your health care provider. Make sure you discuss any questions you have with your health care provider. Document  Released: 08/22/2005 Document Revised: 01/28/2016 Document Reviewed: 04/29/2013 Elsevier Interactive Patient Education  2017 [employer] Inc.  

## 2017-07-21 NOTE — Patient Instructions (Signed)
[facility] Discharge Instructions for Patients Receiving Chemotherapy  Today you received the following chemotherapy agents Adriamycin and Cytoxin  To help prevent nausea and vomiting after your treatment, we encourage you to take your nausea medication as directed   If you develop nausea and vomiting that is not controlled by your nausea medication, call the clinic.   BELOW ARE SYMPTOMS THAT SHOULD BE REPORTED IMMEDIATELY:  *FEVER GREATER THAN 100.5 F  *CHILLS WITH OR WITHOUT FEVER  NAUSEA AND VOMITING THAT IS NOT CONTROLLED WITH YOUR NAUSEA MEDICATION  *UNUSUAL SHORTNESS OF BREATH  *UNUSUAL BRUISING OR BLEEDING  TENDERNESS IN MOUTH AND THROAT WITH OR WITHOUT PRESENCE OF ULCERS  *URINARY PROBLEMS  *BOWEL PROBLEMS  UNUSUAL RASH Items with * indicate a potential emergency and should be followed up as soon as possible.  Feel free to call the clinic should you have any questions or concerns. The clinic phone number is [phone].  Please show the [facility] at check-in to the Emergency Department and triage nurse.

## 2017-07-24 ENCOUNTER — Ambulatory Visit: Payer: BLUE CROSS/BLUE SHIELD

## 2017-07-24 DIAGNOSIS — R293 Abnormal posture: Secondary | ICD-10-CM | POA: Diagnosis not present

## 2017-07-24 DIAGNOSIS — Z483 Aftercare following surgery for neoplasm: Secondary | ICD-10-CM | POA: Diagnosis not present

## 2017-07-24 DIAGNOSIS — M25512 Pain in left shoulder: Secondary | ICD-10-CM | POA: Diagnosis not present

## 2017-07-24 DIAGNOSIS — M25612 Stiffness of left shoulder, not elsewhere classified: Secondary | ICD-10-CM

## 2017-07-24 DIAGNOSIS — M25611 Stiffness of right shoulder, not elsewhere classified: Secondary | ICD-10-CM | POA: Diagnosis not present

## 2017-07-24 DIAGNOSIS — M25511 Pain in right shoulder: Secondary | ICD-10-CM | POA: Diagnosis not present

## 2017-07-24 NOTE — Therapy (Signed)
[place], [place], [address] Phone: [phone]   Fax:  [phone]  Physical Therapy Treatment  Patient Details  Name: NICKCOLE BRALLEY MRN: [id number] Date of Birth: September 24, 1970 Referring Provider: Dr. Burr Medico    Encounter Date: 07/24/2017  PT End of Session - 07/24/17 1104    Visit Number  13    Number of Visits  17    Date for PT Re-Evaluation  07/25/17    PT Start Time  1021    PT Stop Time  1105    PT Time Calculation (min)  44 min    Activity Tolerance  Patient tolerated treatment well    Behavior During Therapy  [facility] for tasks assessed/performed       Past Medical History:  Diagnosis Date  . Anemia   . Anxiety   . Cancer ([place]) 03/2017   left breast cancer  . Chronic ITP (idiopathic thrombocytopenia) (HCC)   . Family history of breast cancer   . Family history of ovarian cancer   . Headache    last migraine...9/24  . Heart murmur    at birth.  none now  . MTHFR mutation [facility])     Past Surgical History:  Procedure Laterality Date  . AXILLARY LYMPH NODE DISSECTION Left 04/27/2017   Performed by [name], MD at [facility]  . BILATERAL BREAST RECONSTRUCTION WITH PLACEMENT OF TISSUE EXPANDER AND FLEX HD (ACELLULAR HYDRATED DERMIS) Bilateral 04/27/2017   Performed by Wallace Going, DO at [facility]  . BREAST SURGERY    . INSERTION PORT-A-CATH RIGHT SUBCLAVIAN Right 06/01/2017   Performed by [name], MD at [facility]  . MASTECTOMY    . SKIN SPARING MASTECTOMY Bilateral 04/27/2017   Performed by [name], MD at [facility]  . WISDOM TOOTH EXTRACTION      There were no vitals filed for this visit.  Subjective Assessment - 07/24/17 1028    Subjective  This last chemo seemed to bother me the most. My chest feels fuller than it has in the past and I can still feel the cord in my Lt axilla, it just feels really tight right now.     Pertinent History  Patient was diagnosed on 03/21/17 with left invasive lobular carcinoma breast cancer. It measures 2.1 cm and is located in the upper outer quadrant. She also has 6 abnormal axillary lymph nodes and a node was biopsied and found to be positive for carcinoma.   04/27/2017 .bilateral mastecomy with ALND ( 21 nodes removed) with immedicate expanders. She is planning to have chemo, then have implant surgery and hysterectomy and then have radiation     Patient Stated Goals  to get motion in arm back, return to exercise, and learn what to do to prevent lymphedema     Currently in Pain?  No/denies                      [facility] Adult PT Treatment/Exercise - 07/24/17 0001      Shoulder Exercises: Standing   Other Standing Exercises  Completed Strength ABC Program starting with quadruped opposite UE/LE but pt will perform these with arms alt first, then LE's alternate as she had trouble stabilizing when performing together.      Manual Therapy   Myofascial Release  to cording in left axilla    Passive ROM  PROM to left shoulder in supine in direction of  flexion, abduction providing pressure to pec to help release                Short Term Clinic Goals - 07/05/17 1154      CC Short Term Goal  #1   Title  pt will be knowledgable of lymphedema risk reduction practices     Time  4    Period  Weeks    Status  On-going      CC Short Term Goal  #2   Title  Pt will be independent in postural and shoulder stretching exercises    Status  Achieved        Breast Clinic Goals - 03/29/17 1232      Patient will be able to verbalize understanding of pertinent lymphedema risk reduction practices relevant to her diagnosis specifically related to skin care.   Time  1    Period  Days    Status  Achieved      Patient will be able to return demonstrate and/or verbalize understanding of the post-op home exercise program related to regaining shoulder range of motion.   Time   1    Period  Days    Status  Achieved      Patient will be able to verbalize understanding of the importance of attending the postoperative After Breast Cancer Class for further lymphedema risk reduction education and therapeutic exercise.   Time  1    Period  Days    Status  Achieved       Long Term Clinic Goals - 07/20/17 1739      CC Long Term Goal  #1   Title  Pt will have 140 degrees of left shoulder abduction so that she can received radiation treatment     Baseline  05/25/2017: 60 degrees , 07/05/2017  140 degrees     Status  On-going      CC Long Term Goal  #2   Title  Pt will ahve 150 degrees of right shoulder abduction so that she can perform household activities without difficulty     Baseline  05/25/2017: 95 degrees , 155 degrees on 07/05/2017    Status  On-going      CC Long Term Goal  #3   Title  Pt will be independent in strength ABC program with knowledge of how to safely increase her intensity of exercise    Time  6    Period  Weeks    Status  New      CC Long Term Goal  #4   Title  Pt will decrease Quick DASH score to < 20 indicating a function improvment of arms     Baseline  52.27 on 05/25/2017     Time  6    Period  Weeks    Status  On-going      CC Long Term Goal  #5   Title   will have a report a reduction in cording left axilla by 75%    Time  6    Period  Weeks    Status  New      CC Long Term Goal  #6   Title  Pt will be able to return to plank and side crunch exercise that she wants to do without pain     Time  6    Period  Weeks    Status  New      Additional Goals   Additional Goals  Yes  Plan - 07/24/17 1228    Clinical Impression Statement  Pt reports cording has felt tighter since last chemo treatment as she has felt more swelling in her chest since then as well. Focused on stretching her Lt axilla cording today after finishing instructing pt in Strength ABC Program which she tolerated well though she did have trouble  stabilizing pelvis/core with quadruped so modified this to pt performing UE's then LE's, not UE/LE at same time, she tolerated this better.      Rehab Potential  Good    Clinical Impairments Affecting Rehab Potential  21 nodes removed in left axilla     PT Frequency  2x / week    PT Duration  8 weeks    PT Treatment/Interventions  Therapeutic exercise;Patient/family education;ADLs/Self Care Home Management;Taping;DME Instruction;Dry needling;Functional mobility training;Therapeutic activities;Manual lymph drainage;Manual techniques;Passive range of motion    PT Next Visit Plan  Renew next visit for 1x/wk as pt reports feeling ready for this. Review Strength ABC program . Focus on activities to increase scapular mobility with manual work as needed to help with myofascial release     Consulted and Agree with Plan of Care  Patient       Patient will benefit from skilled therapeutic intervention in order to improve the following deficits and impairments:  Decreased range of motion, Impaired UE functional use, Pain, Decreased knowledge of precautions, Postural dysfunction, Decreased knowledge of use of DME, Decreased strength, Increased fascial restricitons, Impaired perceived functional ability  Visit Diagnosis: Stiffness of left shoulder, not elsewhere classified  Acute pain of left shoulder  Abnormal posture     Problem List Patient Active Problem List   Diagnosis Date Noted  . Breast cancer ([place]) 04/27/2017  . Genetic testing 04/17/2017  . BRCA2 gene mutation positive 04/17/2017  . Family history of breast cancer   . Family history of ovarian cancer   . 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 Cough 03/10/2014  . Esophagitis 03/10/2014  . Chronic ITP (idiopathic thrombocytopenia) (HCC) 03/10/2014  . ALLERGIC REACTION, ACUTE 07/22/2008    Otelia Limes, PTA 07/24/2017, 12:33 PM  [place], [place], [address] Phone: [phone]   Fax:  [phone]  Name: ADELIZ TONKINSON MRN: [id number] Date of Birth: 06-04-1971

## 2017-07-25 ENCOUNTER — Telehealth: Payer: Self-pay | Admitting: Hematology

## 2017-07-25 NOTE — Telephone Encounter (Signed)
Left voicemail for patient regarding appts added per 11/13 sch msg.

## 2017-07-29 ENCOUNTER — Ambulatory Visit (HOSPITAL_BASED_OUTPATIENT_CLINIC_OR_DEPARTMENT_OTHER): Payer: BLUE CROSS/BLUE SHIELD

## 2017-07-29 VITALS — BP 97/61 | HR 82 | Temp 98.8°F | Resp 16

## 2017-07-29 DIAGNOSIS — C50912 Malignant neoplasm of unspecified site of left female breast: Secondary | ICD-10-CM

## 2017-07-29 DIAGNOSIS — C773 Secondary and unspecified malignant neoplasm of axilla and upper limb lymph nodes: Secondary | ICD-10-CM

## 2017-07-29 MED ORDER — HEPARIN SOD (PORK) LOCK FLUSH 100 UNIT/ML IV SOLN
500.0000 [IU] | Freq: Once | INTRAVENOUS | Status: AC
  Administered 2017-07-29: 500 [IU] via INTRAVENOUS
  Filled 2017-07-29: qty 5

## 2017-07-29 MED ORDER — SODIUM CHLORIDE 0.9% FLUSH
10.0000 mL | Freq: Once | INTRAVENOUS | Status: AC
  Administered 2017-07-29: 10 mL via INTRAVENOUS
  Filled 2017-07-29: qty 10

## 2017-07-29 MED ORDER — SODIUM CHLORIDE 0.9 % IV SOLN
Freq: Once | INTRAVENOUS | Status: AC
  Administered 2017-07-29: 09:00:00 via INTRAVENOUS

## 2017-07-29 NOTE — Patient Instructions (Signed)
Dehydration, Adult Dehydration is when there is not enough fluid or water in your body. This happens when you lose more fluids than you take in. Dehydration can range from mild to very bad. It should be treated right away to keep it from getting very bad. Symptoms of mild dehydration may include:  Thirst.  Dry lips.  Slightly dry mouth.  Dry, warm skin.  Dizziness. Symptoms of moderate dehydration may include:  Very dry mouth.  Muscle cramps.  Dark pee (urine). Pee may be the color of tea.  Your body making less pee.  Your eyes making fewer tears.  Heartbeat that is uneven or faster than normal (palpitations).  Headache.  Light-headedness, especially when you stand up from sitting.  Fainting (syncope). Symptoms of very bad dehydration may include:  Changes in skin, such as: ? Cold and clammy skin. ? Blotchy (mottled) or pale skin. ? Skin that does not quickly return to normal after being lightly pinched and let go (poor skin turgor).  Changes in body fluids, such as: ? Feeling very thirsty. ? Your eyes making fewer tears. ? Not sweating when body temperature is high, such as in hot weather. ? Your body making very little pee.  Changes in vital signs, such as: ? Weak pulse. ? Pulse that is more than 100 beats a minute when you are sitting still. ? Fast breathing. ? Low blood pressure.  Other changes, such as: ? Sunken eyes. ? Cold hands and feet. ? Confusion. ? Lack of energy (lethargy). ? Trouble waking up from sleep. ? Short-term weight loss. ? Unconsciousness. Follow these instructions at home:  If told by your doctor, drink an ORS: ? Make an ORS by using instructions on the package. ? Start by drinking small amounts, about  cup (120 mL) every 5-10 minutes. ? Slowly drink more until you have had the amount that your doctor said to have.  Drink enough clear fluid to keep your pee clear or pale yellow. If you were told to drink an ORS, finish the ORS  first, then start slowly drinking clear fluids. Drink fluids such as: ? Water. Do not drink only water by itself. Doing that can make the salt (sodium) level in your body get too low (hyponatremia). ? Ice chips. ? Fruit juice that you have added water to (diluted). ? Low-calorie sports drinks.  Avoid: ? Alcohol. ? Drinks that have a lot of sugar. These include high-calorie sports drinks, fruit juice that does not have water added, and soda. ? Caffeine. ? Foods that are greasy or have a lot of fat or sugar.  Take over-the-counter and prescription medicines only as told by your doctor.  Do not take salt tablets. Doing that can make the salt level in your body get too high (hypernatremia).  Eat foods that have minerals (electrolytes). Examples include bananas, oranges, potatoes, tomatoes, and spinach.  Keep all follow-up visits as told by your doctor. This is important. Contact a doctor if:  You have belly (abdominal) pain that: ? Gets worse. ? Stays in one area (localizes).  You have a rash.  You have a stiff neck.  You get angry or annoyed more easily than normal (irritability).  You are more sleepy than normal.  You have a harder time waking up than normal.  You feel: ? Weak. ? Dizzy. ? Very thirsty.  You have peed (urinated) only a small amount of very dark pee during 6-8 hours. Get help right away if:  You have symptoms of   very bad dehydration.  You cannot drink fluids without throwing up (vomiting).  Your symptoms get worse with treatment.  You have a fever.  You have a very bad headache.  You are throwing up or having watery poop (diarrhea) and it: ? Gets worse. ? Does not go away.  You have blood or something green (bile) in your throw-up.  You have blood in your poop (stool). This may cause poop to look black and tarry.  You have not peed in 6-8 hours.  You pass out (faint).  Your heart rate when you are sitting still is more than 100 beats a  minute.  You have trouble breathing. This information is not intended to replace advice given to you by your health care provider. Make sure you discuss any questions you have with your health care provider. Document Released: 06/18/2009 Document Revised: 03/11/2016 Document Reviewed: 10/16/2015 Elsevier Interactive Patient Education  2018 [employer]  

## 2017-07-31 ENCOUNTER — Encounter: Payer: Self-pay | Admitting: General Practice

## 2017-07-31 NOTE — Progress Notes (Signed)
[facility] Spiritual Care Note  LVM of availability and encouragement per referral from pt's friend/minister.   [place], [place], [facility] Pager [phone] Voicemail [phone]

## 2017-08-01 ENCOUNTER — Other Ambulatory Visit: Payer: Self-pay

## 2017-08-01 ENCOUNTER — Other Ambulatory Visit: Payer: Self-pay | Admitting: Hematology

## 2017-08-01 ENCOUNTER — Ambulatory Visit: Payer: BLUE CROSS/BLUE SHIELD | Admitting: Physical Therapy

## 2017-08-01 ENCOUNTER — Encounter: Payer: Self-pay | Admitting: Physical Therapy

## 2017-08-01 DIAGNOSIS — Z483 Aftercare following surgery for neoplasm: Secondary | ICD-10-CM

## 2017-08-01 DIAGNOSIS — M25611 Stiffness of right shoulder, not elsewhere classified: Secondary | ICD-10-CM

## 2017-08-01 DIAGNOSIS — M25511 Pain in right shoulder: Secondary | ICD-10-CM

## 2017-08-01 DIAGNOSIS — R293 Abnormal posture: Secondary | ICD-10-CM | POA: Diagnosis not present

## 2017-08-01 DIAGNOSIS — M25512 Pain in left shoulder: Secondary | ICD-10-CM | POA: Diagnosis not present

## 2017-08-01 DIAGNOSIS — M25612 Stiffness of left shoulder, not elsewhere classified: Secondary | ICD-10-CM

## 2017-08-01 NOTE — Therapy (Signed)
[place], [place], [address] Phone: [phone]   Fax:  [phone]  Physical Therapy Treatment  Patient Details  Name: Madison Savage MRN: [id number] Date of Birth: 1971/03/29 Referring Provider: Dr. Burr Medico    Encounter Date: 08/01/2017  PT End of Session - 08/01/17 1659    Visit Number  14    Number of Visits  25    Date for PT Re-Evaluation  09/04/17    PT Start Time  1602    PT Stop Time  1645    PT Time Calculation (min)  43 min    Activity Tolerance  Patient tolerated treatment well    Behavior During Therapy  [facility] for tasks assessed/performed       Past Medical History:  Diagnosis Date  . Anemia   . Anxiety   . Cancer ([place]) 03/2017   left breast cancer  . Chronic ITP (idiopathic thrombocytopenia) (HCC)   . Family history of breast cancer   . Family history of ovarian cancer   . Headache    last migraine...9/24  . Heart murmur    at birth.  none now  . MTHFR mutation [facility])     Past Surgical History:  Procedure Laterality Date  . AXILLARY LYMPH NODE DISSECTION Left 04/27/2017   Procedure: AXILLARY LYMPH NODE DISSECTION;  Surgeon: [name], MD;  Location: [facility];  Service: General;  Laterality: Left;  . BILATERAL TOTAL MASTECTOMY WITH AXILLARY LYMPH NODE DISSECTION Bilateral 04/27/2017   Procedure: SKIN SPARING MASTECTOMY;  Surgeon: [name], MD;  Location: [place];  Service: General;  Laterality: Bilateral;  . BREAST RECONSTRUCTION WITH PLACEMENT OF TISSUE EXPANDER AND FLEX HD (ACELLULAR HYDRATED DERMIS) Bilateral 04/27/2017   Procedure: BILATERAL BREAST RECONSTRUCTION WITH PLACEMENT OF TISSUE EXPANDER AND FLEX HD (ACELLULAR HYDRATED DERMIS);  Surgeon: Wallace Going, DO;  Location: [place];  Service: Plastics;  Laterality: Bilateral;  . BREAST SURGERY    . MASTECTOMY    . PORTACATH PLACEMENT Right 06/01/2017    Procedure: INSERTION PORT-A-CATH RIGHT SUBCLAVIAN;  Surgeon: [name], MD;  Location: [place];  Service: General;  Laterality: Right;  . WISDOM TOOTH EXTRACTION      There were no vitals filed for this visit.  Subjective Assessment - 08/01/17 1602    Subjective  Pt states she still has a cough .  She has been having more tightness in left side around the expander and continues with thick cording in left axilla     Pertinent History  Patient was diagnosed on 03/21/17 with left invasive lobular carcinoma breast cancer. It measures 2.1 cm and is located in the upper outer quadrant. She also has 6 abnormal axillary lymph nodes and a node was biopsied and found to be positive for carcinoma.   04/27/2017 .bilateral mastecomy with ALND ( 21 nodes removed) with immedicate expanders. She is planning to have chemo, then have implant surgery and hysterectomy and then have radiation     Patient Stated Goals  to get motion in arm back, return to exercise, and learn what to do to prevent lymphedema     Currently in Pain?  Yes she feels pulling in her chest and down her side     Pain Score  7     Pain Location  Axilla    Pain Orientation  Left    Pain Descriptors / Indicators  Aching;Tightness    Pain Type  Chronic pain  Pain Onset  1 to 4 weeks ago         [facility] PT Assessment - 08/01/17 0001      AROM   Right Shoulder ABduction  167 Degrees    Left Shoulder ABduction  155 Degrees                  [facility] Adult PT Treatment/Exercise - 08/01/17 0001      Self-Care   Self-Care  Other Self-Care Comments    Other Self-Care Comments   chip pack for left axilla to see if compression will help decrease cording in this area       Shoulder Exercises: Supine   Protraction  AROM;Right;Left;10 reps emphasis on scapular mobility       Shoulder Exercises: ROM/Strengthening   Other ROM/Strengthening Exercises  in quadruped for cat/cow and childs pose for interscapular and thoracic mobility        Manual Therapy   Soft tissue mobilization  in right sidelying, with biotone to tight tender areas in left lateral scapula and posterior shoulder areas     Myofascial Release  to cording in left axilla    Scapular Mobilization  in left sidelying to right scapula for pain relief.  Scapula very tight to rib cage but better able to move after soft tissue work     Passive ROM  PROM to left shoulder in supine in direction of flexion, abduction providing pressure to pec to help release                Short Term Clinic Goals - 08/01/17 1611      CC Short Term Goal  #1   Title  pt will be knowledgable of lymphedema risk reduction practices     Status  Achieved      CC Short Term Goal  #2   Title  Pt will be independent in postural and shoulder stretching exercises    Status  Achieved        Breast Clinic Goals - 03/29/17 1232      Patient will be able to verbalize understanding of pertinent lymphedema risk reduction practices relevant to her diagnosis specifically related to skin care.   Time  1    Period  Days    Status  Achieved      Patient will be able to return demonstrate and/or verbalize understanding of the post-op home exercise program related to regaining shoulder range of motion.   Time  1    Period  Days    Status  Achieved      Patient will be able to verbalize understanding of the importance of attending the postoperative After Breast Cancer Class for further lymphedema risk reduction education and therapeutic exercise.   Time  1    Period  Days    Status  Achieved       Long Term Clinic Goals - 08/01/17 1611      CC Long Term Goal  #1   Title  Pt will have 140 degrees of left shoulder abduction so that she can received radiation treatment     Baseline  05/25/2017: 60 degrees , 07/05/2017  140 degrees ,08/01/2017 155     Status  Achieved      CC Long Term Goal  #2   Title  Pt will ahve 150 degrees of right shoulder abduction so that she can perform  household activities without difficulty     Baseline  05/25/2017: 95 degrees , 155 degrees  on 07/05/2017, 167 on 08/01/2017     Status  Achieved      CC Long Term Goal  #3   Title  Pt will be independent in strength ABC program with knowledge of how to safely increase her intensity of exercise    Status  Achieved      CC Long Term Goal  #4   Title  Pt will decrease Quick DASH score to < 20 indicating a function improvment of arms     Baseline  52.27 on 05/25/2017       CC Long Term Goal  #5   Title   will have a report a reduction in cording left axilla by 75%    Time  6    Period  Weeks    Status  New         Plan - 08/01/17 1700    Clinical Impression Statement  Pt has made significant improvments in shoulder range of motion and strength, but she continues with left axillary cording and decreased scapular mobility.  She will continue with exercise on her own at home and benefit from continued PT for soft tissue work , myofascial release and stretching     Rehab Potential  Good    Clinical Impairments Affecting Rehab Potential  21 nodes removed in left axilla     PT Frequency  2x / week 1x per week with 2x if needed     PT Duration  4 weeks    PT Next Visit Plan   Focus on activities to increase scapular mobility with manual work for  myofascial release and cording reduction     Consulted and Agree with Plan of Care  Patient       Patient will benefit from skilled therapeutic intervention in order to improve the following deficits and impairments:  Decreased range of motion, Impaired UE functional use, Pain, Decreased knowledge of precautions, Postural dysfunction, Decreased knowledge of use of DME, Decreased strength, Increased fascial restricitons, Impaired perceived functional ability  Visit Diagnosis: Stiffness of left shoulder, not elsewhere classified  Acute pain of left shoulder  Abnormal posture  Pain of right shoulder joint on movement  Stiffness of right shoulder,  not elsewhere classified  Aftercare following surgery for neoplasm     Problem List Patient Active Problem List   Diagnosis Date Noted  . Breast cancer ([place]) 04/27/2017  . Genetic testing 04/17/2017  . BRCA2 gene mutation positive 04/17/2017  . Family history of breast cancer   . Family history of ovarian cancer   . Breast cancer metastasized to axillary lymph node, left ([place]) 03/29/2017  . Cough 03/10/2014  . Esophagitis 03/10/2014  . Chronic ITP (idiopathic thrombocytopenia) (HCC) 03/10/2014  . ALLERGIC REACTION, ACUTE 07/22/2008   Donato Heinz. Owens Shark PT  [place]wood Levo 08/01/2017, 5:06 PM  [place], [place], [address] Phone: [phone]   Fax:  [phone]  Name: Madison Savage MRN: [id number] Date of Birth: 08/24/1971

## 2017-08-02 ENCOUNTER — Telehealth: Payer: Self-pay | Admitting: Hematology

## 2017-08-02 ENCOUNTER — Telehealth: Payer: Self-pay | Admitting: *Deleted

## 2017-08-02 NOTE — Telephone Encounter (Signed)
Pt called and left vm stating she did not feel well enough to receive chemo on 11/30 and would like to start chemo on 12/7. Called pt back and left vm stating she should still keep her appt to be assessed or to be seen earlier. Request return call. Sent Dr. Burr Medico msg regarding pt request.

## 2017-08-02 NOTE — Telephone Encounter (Signed)
Spoke to patient regarding upcoming November appointments. °

## 2017-08-02 NOTE — Telephone Encounter (Signed)
Pt returned call, relate symptoms of cough and not feeling well. Afbrile. Discussed pt with pt that we will cancel appts on 11/30 and schedule for [facility] and IVF on 11/29. Received verbal understanding. Denies further needs at this time.

## 2017-08-03 ENCOUNTER — Ambulatory Visit: Payer: BLUE CROSS/BLUE SHIELD | Admitting: Physical Therapy

## 2017-08-03 ENCOUNTER — Ambulatory Visit (HOSPITAL_BASED_OUTPATIENT_CLINIC_OR_DEPARTMENT_OTHER): Payer: BLUE CROSS/BLUE SHIELD

## 2017-08-03 ENCOUNTER — Ambulatory Visit (HOSPITAL_BASED_OUTPATIENT_CLINIC_OR_DEPARTMENT_OTHER): Payer: BLUE CROSS/BLUE SHIELD | Admitting: Nurse Practitioner

## 2017-08-03 ENCOUNTER — Encounter: Payer: Self-pay | Admitting: Nurse Practitioner

## 2017-08-03 ENCOUNTER — Ambulatory Visit: Payer: BLUE CROSS/BLUE SHIELD

## 2017-08-03 ENCOUNTER — Other Ambulatory Visit (HOSPITAL_BASED_OUTPATIENT_CLINIC_OR_DEPARTMENT_OTHER): Payer: BLUE CROSS/BLUE SHIELD

## 2017-08-03 VITALS — BP 105/65 | HR 73 | Temp 97.4°F | Resp 18 | Ht 67.5 in | Wt 120.0 lb

## 2017-08-03 DIAGNOSIS — J069 Acute upper respiratory infection, unspecified: Secondary | ICD-10-CM | POA: Diagnosis not present

## 2017-08-03 DIAGNOSIS — Z170 Estrogen receptor positive status [ER+]: Secondary | ICD-10-CM

## 2017-08-03 DIAGNOSIS — C773 Secondary and unspecified malignant neoplasm of axilla and upper limb lymph nodes: Secondary | ICD-10-CM | POA: Diagnosis not present

## 2017-08-03 DIAGNOSIS — C50912 Malignant neoplasm of unspecified site of left female breast: Secondary | ICD-10-CM | POA: Diagnosis not present

## 2017-08-03 DIAGNOSIS — C50412 Malignant neoplasm of upper-outer quadrant of left female breast: Secondary | ICD-10-CM

## 2017-08-03 LAB — COMPREHENSIVE METABOLIC PANEL
ALT: 13 U/L (ref 0–55)
AST: 13 U/L (ref 5–34)
Albumin: 4.1 g/dL (ref 3.5–5.0)
Alkaline Phosphatase: 95 U/L (ref 40–150)
Anion Gap: 7 mEq/L (ref 3–11)
BUN: 12.7 mg/dL (ref 7.0–26.0)
CO2: 26 mEq/L (ref 22–29)
Calcium: 9.7 mg/dL (ref 8.4–10.4)
Chloride: 106 mEq/L (ref 98–109)
Creatinine: 0.8 mg/dL (ref 0.6–1.1)
EGFR: >60 mL/min/{1.73_m2} (ref >60)
Glucose: 89 mg/dl (ref 70–140)
Potassium: 4.4 mEq/L (ref 3.5–5.1)
Sodium: 139 mEq/L (ref 136–145)
Total Bilirubin: <0.22 mg/dL (ref 0.20–1.20)
Total Protein: 6.8 g/dL (ref 6.4–8.3)

## 2017-08-03 LAB — CBC WITH DIFFERENTIAL/PLATELET
BASO%: 0.5 % (ref 0.0–2.0)
Basophils Absolute: 0 10*3/uL (ref 0.0–0.1)
EOS%: 0.2 % (ref 0.0–7.0)
Eosinophils Absolute: 0 10*3/uL (ref 0.0–0.5)
HCT: 38.3 % (ref 34.8–46.6)
HGB: 12.8 g/dL (ref 11.6–15.9)
LYMPH%: 22.7 % (ref 14.0–49.7)
MCH: 30.5 pg (ref 25.1–34.0)
MCHC: 33.4 g/dL (ref 31.5–36.0)
MCV: 91.4 fL (ref 79.5–101.0)
MONO#: 0.9 10*3/uL (ref 0.1–0.9)
MONO%: 20.4 % — ABNORMAL HIGH (ref 0.0–14.0)
NEUT#: 2.5 10*3/uL (ref 1.5–6.5)
NEUT%: 56.2 % (ref 38.4–76.8)
Platelets: 186 10*3/uL (ref 145–400)
RBC: 4.19 10*6/uL (ref 3.70–5.45)
RDW: 15.9 % — ABNORMAL HIGH (ref 11.2–14.5)
WBC: 4.4 10*3/uL (ref 3.9–10.3)
lymph#: 1 10*3/uL (ref 0.9–3.3)

## 2017-08-03 MED ORDER — SODIUM CHLORIDE 0.9% FLUSH
10.0000 mL | Freq: Once | INTRAVENOUS | Status: AC
  Administered 2017-08-03: 10 mL via INTRAVENOUS
  Filled 2017-08-03: qty 10

## 2017-08-03 MED ORDER — HYDROCOD POLST-CPM POLST ER 10-8 MG/5ML PO SUER: 5.0000 mL | Freq: Every evening | ORAL | 0 refills | Status: DC | PRN

## 2017-08-03 MED ORDER — SODIUM CHLORIDE 0.9 % IV SOLN
INTRAVENOUS | Status: DC
  Administered 2017-08-03: 11:00:00 via INTRAVENOUS

## 2017-08-03 MED ORDER — AZITHROMYCIN 250 MG PO TABS: ORAL_TABLET | ORAL | 0 refills | Status: DC

## 2017-08-03 MED ORDER — HEPARIN SOD (PORK) LOCK FLUSH 10 UNIT/ML IV SOLN
10.0000 [IU] | Freq: Once | INTRAVENOUS | Status: AC
  Administered 2017-08-03: 10 [IU] via INTRAVENOUS

## 2017-08-03 NOTE — Progress Notes (Signed)
[place]  [phone] [phone]  Clinic Follow up Note   Patient Care Team: Dorena Cookey, MD as PCP - [name], MD as Consulting Physician (General Surgery) [name], MD as Consulting Physician (Hematology) Kyung Rudd, MD as Consulting Physician (Radiation Oncology) Brien Few, MD as Consulting Physician (Obstetrics and Gynecology) 08/03/2017  CHIEF COMPLAINTS:  Cough, congestion, fatigue   SUMMARY OF ONCOLOGIC HISTORY: Oncology History   Cancer Staging Breast cancer metastasized to axillary lymph node, left ([place]) Staging form: Breast, AJCC 8th Edition - Clinical stage from 03/22/2017: Stage Unknown (cTX, cN1, cM0, GX, ER: Positive, PR: Positive, HER2: Negative) - Signed by [name], MD on 03/29/2017 - Pathologic stage from 04/27/2017: Stage IB (pT1c, pN2a, cM0, G2, ER: Positive, PR: Positive, HER2: Negative) - Signed by [name], MD on 05/25/2017 \     Breast cancer metastasized to axillary lymph node, left ([place])   03/21/2017 Mammogram    Diagnostic mammogram bilateral 03/21/17 IMPRESSION: INCOMPLETE - ADDITIONAL IMAGING EVALUATION NEEDED 1. The 0.6 cm oval mass in the right breast at 11 o'clock middle depth is indeterminate. An ultrasound is recommended.  2. The 0.5cm round focal asymmetry in the left breast central to the nipple middle depth is indeterminate. An ultrasound is recommended.  3. Ultrasound of the palpable abnormalities in the axilla and far left lateral breast is recommended.  4. Multiple clusters of pleomorphic calcifications in the left upper outer breast anterior depth spanning a 2.5 cm area are highly suggestive of malignancy. A stereotactic biopsy is recommended.        03/21/2017 Imaging    US Breast bilateral 03/21/17 IMPRESSION: HIGHLY SUGGESTIVE OF MALIGNANCY 1. Six distinct abnormal left axillary nodes, 2 of which correspond to the palpable lumps felt by the patient. Findings concerning for  metastatic adenopathy. Ultrasound guided biopsy of one of these nodes is recommended.  2. Small 5 mm palpable, oval mass in the left breast 3:00 areolar margin is at an intermediate suspicion. Ultrasound guided biopsy is recommended.  3. Isoechoic 8 mm mass in the right breast 10:00 5 cm from the nipple is also an intermediate suspicion for malignancy, and ultrasound guided biopsy is recommended.  4. Stereotactic guided biopsy for the highly suspicious calcifications in the left breast also recommended as detailed in the mammography report.        03/22/2017 Initial Biopsy    Diagnosis 03/22/17 Lymph node, needle/core biopsy, left axillary node -METASTATIC CARCINOMA, SEE COMMENT Microscopic Comment      03/22/2017 Receptors her2    Lymph node biopsy ER 95% positive, strong staining, PR 5% positive, weak staining, HER-2 negative, with HER2/CEP17 ratio 1.23 and copy #3.95.      03/27/2017 Pathology Results    Diagnosis 03/27/17 1. Breast, left, needle core biopsy -FIBROADENOMATOID NODULE WITH CALCIFICATIONS 2.Breast, left, needle core biopsy -DUCTAL CARCINOMA IN SITU, HIGH GRADE -SEE COMMENT  3. Breast, right, needle, core biopsy -FIBROADENOMA -NO MALIGNANCY IDENTIFIED       03/29/2017 Initial Diagnosis    Breast cancer metastasized to axillary lymph node, left ([place])      04/07/2017 Imaging    CT CAP 04/07/17 IMPRESSION: 1. Several prominent left axillary lymph nodes which may correspond to biopsy proven axillary lymph node metastasis. 2. No thoracic adenopathy identified or evidence of distant metastatic disease.      04/07/2017 Imaging    Bone whole body scan 04/07/17 IMPRESSION: Negative for evidence of osseous metastatic disease.      04/17/2017 Genetic  Testing    BRCA2 c.[id number] (p.Glu260Serfs*15) pathogenic mutation identified on the 9 gene STAT panel.  The STAT Breast cancer panel offered by [facility] includes sequencing and rearrangement analysis for the following 9  genes:  ATM, BRCA1, BRCA2, CDH1, CHEK2, PALB2, PTEN, STK11 and TP53.   The report date is April 17, 2017.  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  The Hereditary Gene Panel offered by [facility] includes sequencing and/or deletion duplication testing of the following 46 genes: APC, ATM, AXIN2, BARD1, BMPR1A, BRCA1, BRCA2, BRIP1, CDH1, CDKN2A (p14ARF), CDKN2A (p16INK4a), CHEK2, CTNNA1, DICER1, EPCAM (Deletion/duplication testing only), GREM1 (promoter region deletion/duplication testing only), KIT, MEN1, MLH1, MSH2, MSH3, MSH6, MUTYH, NBN, NF1, NHTL1, PALB2, PDGFRA, PMS2, POLD1, POLE, PTEN, RAD50, RAD51C, RAD51D, SDHB, SDHC, SDHD, SMAD4, SMARCA4. STK11, TP53, TSC1, TSC2, and VHL.  The following genes were evaluated for sequence changes only: SDHA and HOXB13 c.251G>A variant only.  The report date is April 29, 2017.       04/27/2017 Surgery    Surgey 04/27/17 BILATERAL SKIN SPARING MASTECTOMY and LEFT AXILLARY LYMPH NODE and BILATERAL BREAST RECONSTRUCTION WITH PLACEMENT OF TISSUE EXPANDER AND FLEX HD (ACELLULAR HYDRATED DERMIS) Bilateral BY Dr. Barry Dienes and Dr. Marla Roe       04/27/2017 Pathology Results     Diagnosis  04/27/17 1. Breast, simple mastectomy, Right - FIBROCYSTIC CHANGES WITH ADENOSIS. - USUAL DUCTAL HYPERPLASIA. - HEALING BIOPSY SITE. - THERE IS NO EVIDENCE OF MALIGNANCY. 2. Breast, simple mastectomy, Left - INVASIVE DUCTAL CARCINOMA, GRADE II/III, SPANNING 1.2 CM. - DUCTAL CARCINOMA IN SITU WITH CALCIFICATIONS, HIGH GRADE. - INVASIVE DUCTAL CARCINOMA IS FOCALLY PRESENT AT THE ANTERIOR MARGIN. - DUCTAL CARCINOMA IN SITU IS BROADLY 0.1 CM TO THE POSTERIOR MARGIN. - SEE ONCOLOGY TABLE BELOW. 3. Lymph nodes, regional resection, Left axillary - METASTATIC CARCINOMA IN 8 OF 21 LYMPH NODES (8/21). - SEE COMMENT      06/09/2017 -  Chemotherapy    Adriamycin and Cytoxan  every 2 weeks, for 4 cycles starting 06/09/17, followed by weekly Taxol and carboplatin for 12 weeks.     CURRENT THERAPY: Adriamycin and Cytoxan every 2 weeks, for 4 cycles starting 06/09/17 - 07/21/17 PENDING weekly Abraxane and carboplatin for 12 weeks starting 08/04/17 (delayed to 12/7 due to URI)  INTERVAL HISTORY: Ms. Riddle presents for unscheduled visit. She reports productive cough with yellow sputum, congestion, sore throat, and increased fatigue for 4 days. Occasional dyspnea with coughing fits, coughing keeps her up at night. Denies fever, chills, wheezing; no GI complaints. She had cold symptoms prior to cycle 4 AC on 07/21/17 that improved slightly but returned 4 days ago. She has known sick contacts. Due to begin cycle 1 abraxane/carbo on 11/30 but asking for 1 week delay.   REVIEW OF SYSTEMS:   Constitutional: Denies fevers, chills or abnormal weight loss (+) moderate fatigue Eyes: Denies blurriness of vision Ears, nose, mouth, throat, and face: Denies mucositis (+) sore throat (+) post nasal drip Respiratory: Denies  Wheezes (+) productive cough yellow sputum x4 days (+) mild dyspnea with coughing fits (+) chest soreness with coughing  Cardiovascular: Denies palpitation, chest discomfort or lower extremity swelling Gastrointestinal:  Denies nausea, vomiting, constipation, diarrhea, heartburn or change in bowel habits Skin: Denies abnormal skin rashes Lymphatics: Denies new lymphadenopathy or easy bruising Neurological:Denies numbness, tingling or new weaknesses Behavioral/Psych: Mood is stable, no new changes  All other systems were reviewed with the patient and are negative.  MEDICAL HISTORY:  Past Medical History:  Diagnosis Date  . Anemia   . Anxiety   . Cancer ([place]) 03/2017   left breast cancer  . Chronic ITP (idiopathic thrombocytopenia) (HCC)   . Family history of breast cancer   . Family history of ovarian cancer   . Headache    last migraine...9/24  . Heart  murmur    at birth.  none now  . MTHFR mutation [facility])     SURGICAL HISTORY: Past Surgical History:  Procedure Laterality Date  . AXILLARY LYMPH NODE DISSECTION Left 04/27/2017   Procedure: AXILLARY LYMPH NODE DISSECTION;  Surgeon: [name], MD;  Location: [place];  Service: General;  Laterality: Left;  . BILATERAL TOTAL MASTECTOMY WITH AXILLARY LYMPH NODE DISSECTION Bilateral 04/27/2017   Procedure: SKIN SPARING MASTECTOMY;  Surgeon: [name], MD;  Location: [place];  Service: General;  Laterality: Bilateral;  . BREAST RECONSTRUCTION WITH PLACEMENT OF TISSUE EXPANDER AND FLEX HD (ACELLULAR HYDRATED DERMIS) Bilateral 04/27/2017   Procedure: BILATERAL BREAST RECONSTRUCTION WITH PLACEMENT OF TISSUE EXPANDER AND FLEX HD (ACELLULAR HYDRATED DERMIS);  Surgeon: Wallace Going, DO;  Location: [place];  Service: Plastics;  Laterality: Bilateral;  . BREAST SURGERY    . MASTECTOMY    . PORTACATH PLACEMENT Right 06/01/2017   Procedure: INSERTION PORT-A-CATH RIGHT SUBCLAVIAN;  Surgeon: [name], MD;  Location: [place];  Service: General;  Laterality: Right;  . WISDOM TOOTH EXTRACTION      I have reviewed the social history and family history with the patient and they are unchanged from previous note.  ALLERGIES:  is allergic to clindamycin; penicillins; and codeine.  MEDICATIONS:  Current Outpatient Medications  Medication Sig Dispense Refill  . Cyanocobalamin (VITAMIN B-12 PO) Take 1 tablet by mouth daily.     . diazepam (VALIUM) 2 MG tablet Take 1 tablet (2 mg total) by mouth every 6 (six) hours as needed for anxiety or muscle spasms. 30 tablet 0  . HYDROcodone-acetaminophen (NORCO/VICODIN) 5-325 MG tablet Take 1 tablet by mouth every 4 (four) hours as needed for moderate pain. 30 tablet 0  . lidocaine-prilocaine (EMLA) cream Apply to affected area once 30 g 3  . Multiple Vitamins-Calcium (ONE-A-DAY WOMENS PO) Take 1 tablet by  mouth daily.    . ondansetron (ZOFRAN) 8 MG tablet Take 1 tablet (8 mg total) by mouth 2 (two) times daily as needed. Start on the third day after chemotherapy. 30 tablet 1  . OVER THE COUNTER MEDICATION Take 1 capsule by mouth daily. Biotin/MSM    . prochlorperazine (COMPAZINE) 10 MG tablet Take 1 tablet (10 mg total) by mouth every 6 (six) hours as needed (Nausea or vomiting). 30 tablet 1  . traMADol (ULTRAM) 50 MG tablet Take 1 tablet (50 mg total) by mouth every 6 (six) hours as needed. 15 tablet 1  . vitamin C (ASCORBIC ACID) 500 MG tablet Take 2,000 mg by mouth daily.      Current Facility-Administered Medications  Medication Dose Route Frequency Provider Last Rate Last Dose  . LORazepam (ATIVAN) injection 0.5 mg  0.5 mg Intravenous Once [name], MD       Facility-Administered Medications Ordered in Other Visits  Medication Dose Route Frequency Provider Last Rate Last Dose  . sodium chloride flush (NS) 0.9 % injection 10 mL  10 mL Intravenous PRN [name], MD   10 mL at 07/21/17 1323    PHYSICAL EXAMINATION: ECOG PERFORMANCE STATUS: 2 - Symptomatic, <50%  confined to bed BP 105/65 (BP Location: Right Arm, Patient Position: Sitting)   Pulse 73   Temp (!) 97.4 F (36.3 C) (Oral)   Resp 18   Ht 5' 7.5" (1.715 m)   Wt 120 lb (54.4 kg)   SpO2 100%   BMI 18.52 kg/m   GENERAL:alert, no distress and comfortable SKIN: skin color, texture, turgor are normal, no rashes or significant lesions EYES: normal, Conjunctiva are pink and non-injected, sclera clear OROPHARYNX: no exudate (+) mild pharyngeal erythema . Lips, buccal mucosa, and tongue normal  NECK: supple, thyroid normal size, non-tender, without nodularity LYMPH:  no palpable cervical lymphadenopathy  LUNGS: clear to auscultation bilaterally with normal breathing effort HEART: regular rate & rhythm and no murmurs and no lower extremity edema ABDOMEN:abdomen soft, non-tender and normal bowel sounds Musculoskeletal:no cyanosis  of digits and no clubbing  NEURO: alert & oriented x 3 with fluent speech, no focal motor/sensory deficits PAC without erythema  LABORATORY DATA:  I have reviewed the data as listed CBC Latest Ref Rng & Units 07/21/2017 07/07/2017 06/30/2017  WBC 3.9 - 10.3 10e3/uL 6.5 7.6 4.6  Hemoglobin 11.6 - 15.9 g/dL 12.8 12.8 11.8  Hematocrit 34.8 - 46.6 % 38.0 37.8 35.4  Platelets 145 - 400 10e3/uL 197 179 182     CMP Latest Ref Rng & Units 07/21/2017 07/07/2017 06/30/2017  Glucose 70 - 140 mg/dl 103 101 105  BUN 7.0 - 26.0 mg/dL 11.5 10.9 14.7  Creatinine 0.6 - 1.1 mg/dL 0.8 0.8 0.7  Sodium 136 - 145 mEq/L 141 142 141  Potassium 3.5 - 5.1 mEq/L 3.8 3.8 3.5  Chloride 101 - 111 mmol/L - - -  CO2 22 - 29 mEq/L _0 Calcium 8.4 - 10.4 mg/dL 9.1 9.1 8.9  Total Protein 6.4 - 8.3 g/dL 6.5 6.4 6.1(L)  Total Bilirubin 0.20 - 1.20 mg/dL <0.22 <0.22 0.24  Alkaline Phos 40 - 150 U/L 91 82 111  AST 5 - 34 U/L _1 ALT 0 - 55 U/L _2 RADIOGRAPHIC STUDIES: I have personally reviewed the radiological images as listed and agreed with the findings in the report. No results found.   ASSESSMENT & PLAN: XOLANI DEGRACIA is a 46 y.o. perimenopausal female with a history of Chronic ITP and MTHFR mutation, presented with a palpable left axillary adenopathy.  1. 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 Chronic ITP 3. Genetics, BRCA2 mutation positive  4. Chest pain, secondary to PAC and tissue expander 5. Cold symptoms/URI  Ms. Balentine presents for symptom management visit for URI symptoms productive cough, sore throat, and congestion. Symptoms began on 11/16 with cycle 4, she took OTC cold medication with only slight improvement. She was around sick family over Thanksgiving and symptoms returned/worsened 4 days ago. She remains afebrile, no wheezing; VSS. CBC unremarkable, WBC normal. Monocytes mildly  elevated but had neulasta recently. I prescribed tussionex cough suppressant PRN at night. We discussed this may be viral but given slight improvement then worsening of symptoms, I prescribed azithromycin pak for bacterial pathogens. She wants to delay cycle 1 abraxane/carboplatin for 1 week, this is reasonable given her symptoms. She will get IV hydration today, she will call if she develops fever, chills or if her symptoms do not improve. Return in 1 week for lab and 1st cycle.   PLAN -tussionex PRN for cough -zpak for URI -IVF  All questions were answered. The patient knows to call the clinic with any problems, questions or concerns. No barriers to learning was detected.     Alla Feeling, NP 08/03/17

## 2017-08-03 NOTE — Patient Instructions (Signed)
Dehydration, Adult Dehydration is a condition in which there is not enough fluid or water in the body. This happens when you lose more fluids than you take in. Important organs, such as the kidneys, brain, and heart, cannot function without a proper amount of fluids. Any loss of fluids from the body can lead to dehydration. Dehydration can range from mild to severe. This condition should be treated right away to prevent it from becoming severe. What are the causes? This condition may be caused by:  Vomiting.  Diarrhea.  Excessive sweating, such as from heat exposure or exercise.  Not drinking enough fluid, especially: ? When ill. ? While doing activity that requires a lot of energy.  Excessive urination.  Fever.  Infection.  Certain medicines, such as medicines that cause the body to lose excess fluid (diuretics).  Inability to access safe drinking water.  Reduced physical ability to get adequate water and food.  What increases the risk? This condition is more likely to develop in people:  Who have a poorly controlled long-term (chronic) illness, such as diabetes, heart disease, or kidney disease.  Who are age 65 or older.  Who are disabled.  Who live in a place with high altitude.  Who play endurance sports.  What are the signs or symptoms? Symptoms of mild dehydration may include:  Thirst.  Dry lips.  Slightly dry mouth.  Dry, warm skin.  Dizziness. Symptoms of moderate dehydration may include:  Very dry mouth.  Muscle cramps.  Dark urine. Urine may be the color of tea.  Decreased urine production.  Decreased tear production.  Heartbeat that is irregular or faster than normal (palpitations).  Headache.  Light-headedness, especially when you stand up from a sitting position.  Fainting (syncope). Symptoms of severe dehydration may include:  Changes in skin, such as: ? Cold and clammy skin. ? Blotchy (mottled) or pale skin. ? Skin that does  not quickly return to normal after being lightly pinched and released (poor skin turgor).  Changes in body fluids, such as: ? Extreme thirst. ? No tear production. ? Inability to sweat when body temperature is high, such as in hot weather. ? Very little urine production.  Changes in vital signs, such as: ? Weak pulse. ? Pulse that is more than 100 beats a minute when sitting still. ? Rapid breathing. ? Low blood pressure.  Other changes, such as: ? Sunken eyes. ? Cold hands and feet. ? Confusion. ? Lack of energy (lethargy). ? Difficulty waking up from sleep. ? Short-term weight loss. ? Unconsciousness. How is this diagnosed? This condition is diagnosed based on your symptoms and a physical exam. Blood and urine tests may be done to help confirm the diagnosis. How is this treated? Treatment for this condition depends on the severity. Mild or moderate dehydration can often be treated at home. Treatment should be started right away. Do not wait until dehydration becomes severe. Severe dehydration is an emergency and it needs to be treated in a hospital. Treatment for mild dehydration may include:  Drinking more fluids.  Replacing salts and minerals in your blood (electrolytes) that you may have lost. Treatment for moderate dehydration may include:  Drinking an oral rehydration solution (ORS). This is a drink that helps you replace fluids and electrolytes (rehydrate). It can be found at pharmacies and retail stores. Treatment for severe dehydration may include:  Receiving fluids through an IV tube.  Receiving an electrolyte solution through a feeding tube that is passed through your nose   and into your stomach (nasogastric tube, or NG tube).  Correcting any abnormalities in electrolytes.  Treating the underlying cause of dehydration. Follow these instructions at home:  If directed by your health care provider, drink an ORS: ? Make an ORS by following instructions on the  package. ? Start by drinking small amounts, about  cup (120 mL) every 5-10 minutes. ? Slowly increase how much you drink until you have taken the amount recommended by your health care provider.  Drink enough clear fluid to keep your urine clear or pale yellow. If you were told to drink an ORS, finish the ORS first, then start slowly drinking other clear fluids. Drink fluids such as: ? Water. Do not drink only water. Doing that can lead to having too little salt (sodium) in the body (hyponatremia). ? Ice chips. ? Fruit juice that you have added water to (diluted fruit juice). ? Low-calorie sports drinks.  Avoid: ? Alcohol. ? Drinks that contain a lot of sugar. These include high-calorie sports drinks, fruit juice that is not diluted, and soda. ? Caffeine. ? Foods that are greasy or contain a lot of fat or sugar.  Take over-the-counter and prescription medicines only as told by your health care provider.  Do not take sodium tablets. This can lead to having too much sodium in the body (hypernatremia).  Eat foods that contain a healthy balance of electrolytes, such as bananas, oranges, potatoes, tomatoes, and spinach.  Keep all follow-up visits as told by your health care provider. This is important. Contact a health care provider if:  You have abdominal pain that: ? Gets worse. ? Stays in one area (localizes).  You have a rash.  You have a stiff neck.  You are more irritable than usual.  You are sleepier or more difficult to wake up than usual.  You feel weak or dizzy.  You feel very thirsty.  You have urinated only a small amount of very dark urine over 6-8 hours. Get help right away if:  You have symptoms of severe dehydration.  You cannot drink fluids without vomiting.  Your symptoms get worse with treatment.  You have a fever.  You have a severe headache.  You have vomiting or diarrhea that: ? Gets worse. ? Does not go away.  You have blood or green matter  (bile) in your vomit.  You have blood in your stool. This may cause stool to look black and tarry.  You have not urinated in 6-8 hours.  You faint.  Your heart rate while sitting still is over 100 beats a minute.  You have trouble breathing. This information is not intended to replace advice given to you by your health care provider. Make sure you discuss any questions you have with your health care provider. Document Released: 08/22/2005 Document Revised: 03/18/2016 Document Reviewed: 10/16/2015 Elsevier Interactive Patient Education  2018 [employer]  

## 2017-08-03 NOTE — Patient Instructions (Signed)
Implanted Port Home Guide An implanted port is a type of central line that is placed under the skin. Central lines are used to provide IV access when treatment or nutrition needs to be given through a person's veins. Implanted ports are used for long-term IV access. An implanted port may be placed because:  You need IV medicine that would be irritating to the small veins in your hands or arms.  You need long-term IV medicines, such as antibiotics.  You need IV nutrition for a long period.  You need frequent blood draws for lab tests.  You need dialysis.  Implanted ports are usually placed in the chest area, but they can also be placed in the upper arm, the abdomen, or the leg. An implanted port has two main parts:  Reservoir. The reservoir is round and will appear as a small, raised area under your skin. The reservoir is the part where a needle is inserted to give medicines or draw blood.  Catheter. The catheter is a thin, flexible tube that extends from the reservoir. The catheter is placed into a large vein. Medicine that is inserted into the reservoir goes into the catheter and then into the vein.  How will I care for my incision site? Do not get the incision site wet. Bathe or shower as directed by your health care provider. How is my port accessed? Special steps must be taken to access the port:  Before the port is accessed, a numbing cream can be placed on the skin. This helps numb the skin over the port site.  Your health care provider uses a sterile technique to access the port. ? Your health care provider must put on a mask and sterile gloves. ? The skin over your port is cleaned carefully with an antiseptic and allowed to dry. ? The port is gently pinched between sterile gloves, and a needle is inserted into the port.  Only "non-coring" port needles should be used to access the port. Once the port is accessed, a blood return should be checked. This helps ensure that the port  is in the vein and is not clogged.  If your port needs to remain accessed for a constant infusion, a clear (transparent) bandage will be placed over the needle site. The bandage and needle will need to be changed every week, or as directed by your health care provider.  Keep the bandage covering the needle clean and dry. Do not get it wet. Follow your health care provider's instructions on how to take a shower or bath while the port is accessed.  If your port does not need to stay accessed, no bandage is needed over the port.  What is flushing? Flushing helps keep the port from getting clogged. Follow your health care provider's instructions on how and when to flush the port. Ports are usually flushed with saline solution or a medicine called heparin. The need for flushing will depend on how the port is used.  If the port is used for intermittent medicines or blood draws, the port will need to be flushed: ? After medicines have been given. ? After blood has been drawn. ? As part of routine maintenance.  If a constant infusion is running, the port may not need to be flushed.  How long will my port stay implanted? The port can stay in for as long as your health care provider thinks it is needed. When it is time for the port to come out, surgery will be   done to remove it. The procedure is similar to the one performed when the port was put in. When should I seek immediate medical care? When you have an implanted port, you should seek immediate medical care if:  You notice a bad smell coming from the incision site.  You have swelling, redness, or drainage at the incision site.  You have more swelling or pain at the port site or the surrounding area.  You have a fever that is not controlled with medicine.  This information is not intended to replace advice given to you by your health care provider. Make sure you discuss any questions you have with your health care provider. Document  Released: 08/22/2005 Document Revised: 01/28/2016 Document Reviewed: 04/29/2013 Elsevier Interactive Patient Education  2017 [employer] Inc.  

## 2017-08-04 ENCOUNTER — Other Ambulatory Visit: Payer: BLUE CROSS/BLUE SHIELD

## 2017-08-04 ENCOUNTER — Ambulatory Visit: Payer: BLUE CROSS/BLUE SHIELD | Admitting: Nurse Practitioner

## 2017-08-04 ENCOUNTER — Ambulatory Visit: Payer: BLUE CROSS/BLUE SHIELD

## 2017-08-09 ENCOUNTER — Ambulatory Visit: Payer: BLUE CROSS/BLUE SHIELD | Attending: General Surgery

## 2017-08-09 ENCOUNTER — Telehealth: Payer: Self-pay | Admitting: *Deleted

## 2017-08-09 DIAGNOSIS — M25511 Pain in right shoulder: Secondary | ICD-10-CM

## 2017-08-09 DIAGNOSIS — R293 Abnormal posture: Secondary | ICD-10-CM | POA: Diagnosis not present

## 2017-08-09 DIAGNOSIS — M25512 Pain in left shoulder: Secondary | ICD-10-CM | POA: Insufficient documentation

## 2017-08-09 DIAGNOSIS — M25611 Stiffness of right shoulder, not elsewhere classified: Secondary | ICD-10-CM | POA: Insufficient documentation

## 2017-08-09 DIAGNOSIS — M25612 Stiffness of left shoulder, not elsewhere classified: Secondary | ICD-10-CM | POA: Diagnosis not present

## 2017-08-09 DIAGNOSIS — Z483 Aftercare following surgery for neoplasm: Secondary | ICD-10-CM

## 2017-08-09 NOTE — Therapy (Signed)
[place], [place], [address] Phone: [phone]   Fax:  [phone]  Physical Therapy Treatment  Patient Details  Name: Madison Savage MRN: [id number] Date of Birth: [birth year] Referring Provider: Dr. Burr Medico    Encounter Date: 08/09/2017  PT End of Session - 08/09/17 1658    Visit Number  15    Number of Visits  25    Date for PT Re-Evaluation  09/04/17    PT Start Time  1612    PT Stop Time  1653    PT Time Calculation (min)  41 min    Activity Tolerance  Patient tolerated treatment well    Behavior During Therapy  [facility] for tasks assessed/performed       Past Medical History:  Diagnosis Date  . Anemia   . Anxiety   . Cancer ([place]) 03/2017   left breast cancer  . Chronic ITP (idiopathic thrombocytopenia) (HCC)   . Family history of breast cancer   . Family history of ovarian cancer   . Headache    last migraine...9/24  . Heart murmur    at birth.  none now  . MTHFR mutation [facility])     Past Surgical History:  Procedure Laterality Date  . AXILLARY LYMPH NODE DISSECTION Left 04/27/2017   Procedure: AXILLARY LYMPH NODE DISSECTION;  Surgeon: [name], MD;  Location: [place];  Service: General;  Laterality: Left;  . BILATERAL TOTAL MASTECTOMY WITH AXILLARY LYMPH NODE DISSECTION Bilateral 04/27/2017   Procedure: SKIN SPARING MASTECTOMY;  Surgeon: [name], MD;  Location: [place];  Service: General;  Laterality: Bilateral;  . BREAST RECONSTRUCTION WITH PLACEMENT OF TISSUE EXPANDER AND FLEX HD (ACELLULAR HYDRATED DERMIS) Bilateral 04/27/2017   Procedure: BILATERAL BREAST RECONSTRUCTION WITH PLACEMENT OF TISSUE EXPANDER AND FLEX HD (ACELLULAR HYDRATED DERMIS);  Surgeon: Wallace Going, DO;  Location: [place];  Service: Plastics;  Laterality: Bilateral;  . BREAST SURGERY    . MASTECTOMY    . PORTACATH PLACEMENT Right 06/01/2017   Procedure: INSERTION PORT-A-CATH RIGHT SUBCLAVIAN;  Surgeon: [name], MD;  Location: [place];  Service: General;  Laterality: Right;  . WISDOM TOOTH EXTRACTION      There were no vitals filed for this visit.  Subjective Assessment - 08/09/17 1614    Subjective  I just got over being really sick last week, they are treating me as if I had pneumonia but they called it an upper respiratory infection. I haven't been able to exercise much since then. My biggest complaint is the cording in my Lt axilla.     Pertinent History  Patient was diagnosed on 03/21/17 with left invasive lobular carcinoma breast cancer. It measures 2.1 cm and is located in the upper outer quadrant. She also has 6 abnormal axillary lymph nodes and a node was biopsied and found to be positive for carcinoma.   04/27/2017 .bilateral mastecomy with ALND ( 21 nodes removed) with immedicate expanders. She is planning to have chemo, then have implant surgery and hysterectomy and then have radiation     Patient Stated Goals  to get motion in arm back, return to exercise, and learn what to do to prevent lymphedema     Currently in Pain?  No/denies                      [facility] Adult PT Treatment/Exercise - 08/09/17 0001      Shoulder Exercises: Pulleys  Flexion  2 minutes    ABduction  2 minutes    ABduction Limitations  VCs for slower pace      Shoulder Exercises: Therapy Ball   Flexion  10 reps With forward lean into end of stretch    ABduction  5 reps    ABduction Limitations  Pt felt this awkward so stopped      Shoulder Exercises: Stretch   Wall Stretch - ABduction  5 reps Finger Ladder      Manual Therapy   Myofascial Release  to cording in left axilla    Scapular Mobilization  in right sidelying to left scapula for pain relief.  Scapula very tight to rib cage but better able to move after soft tissue work     Passive ROM  PROM to left shoulder in supine in direction of flexion, abduction providing pressure  to pec to help release                Short Term Clinic Goals - 08/01/17 1611      CC Short Term Goal  #1   Title  pt will be knowledgable of lymphedema risk reduction practices     Status  Achieved      CC Short Term Goal  #2   Title  Pt will be independent in postural and shoulder stretching exercises    Status  Achieved        Breast Clinic Goals - 03/29/17 1232      Patient will be able to verbalize understanding of pertinent lymphedema risk reduction practices relevant to her diagnosis specifically related to skin care.   Time  1    Period  Days    Status  Achieved      Patient will be able to return demonstrate and/or verbalize understanding of the post-op home exercise program related to regaining shoulder range of motion.   Time  1    Period  Days    Status  Achieved      Patient will be able to verbalize understanding of the importance of attending the postoperative After Breast Cancer Class for further lymphedema risk reduction education and therapeutic exercise.   Time  1    Period  Days    Status  Achieved       Long Term Clinic Goals - 08/01/17 1611      CC Long Term Goal  #1   Title  Pt will have 140 degrees of left shoulder abduction so that she can received radiation treatment     Baseline  05/25/2017: 60 degrees , 07/05/2017  140 degrees ,08/01/2017 155     Status  Achieved      CC Long Term Goal  #2   Title  Pt will ahve 150 degrees of right shoulder abduction so that she can perform household activities without difficulty     Baseline  05/25/2017: 95 degrees , 155 degrees on 07/05/2017, 167 on 08/01/2017     Status  Achieved      CC Long Term Goal  #3   Title  Pt will be independent in strength ABC program with knowledge of how to safely increase her intensity of exercise    Status  Achieved      CC Long Term Goal  #4   Title  Pt will decrease Quick DASH score to < 20 indicating a function improvment of arms     Baseline  52.27 on  05/25/2017  CC Long Term Goal  #5   Title   will have a report a reduction in cording left axilla by 75%    Time  6    Period  Weeks    Status  New         Plan - 08/09/17 1658    Clinical Impression Statement  Continued with focus to Lt axillary cording and soft tissue tightness. Pt reported feeling looser after session and tissue softening noted at axilla by end of session as well.     Rehab Potential  Good    Clinical Impairments Affecting Rehab Potential  21 nodes removed in left axilla     PT Frequency  2x / week 1x/wk with 2x prn    PT Duration  4 weeks    PT Treatment/Interventions  Therapeutic exercise;Patient/family education;ADLs/Self Care Home Management;Taping;DME Instruction;Dry needling;Functional mobility training;Therapeutic activities;Manual lymph drainage;Manual techniques;Passive range of motion    PT Next Visit Plan   Focus on activities to increase scapular mobility with manual work for  myofascial release and cording reduction     Consulted and Agree with Plan of Care  Patient       Patient will benefit from skilled therapeutic intervention in order to improve the following deficits and impairments:  Decreased range of motion, Impaired UE functional use, Pain, Decreased knowledge of precautions, Postural dysfunction, Decreased knowledge of use of DME, Decreased strength, Increased fascial restricitons, Impaired perceived functional ability  Visit Diagnosis: Stiffness of left shoulder, not elsewhere classified  Acute pain of left shoulder  Abnormal posture  Pain of right shoulder joint on movement  Stiffness of right shoulder, not elsewhere classified  Aftercare following surgery for neoplasm     Problem List Patient Active Problem List   Diagnosis Date Noted  . Breast cancer ([place]) 04/27/2017  . Genetic testing 04/17/2017  . BRCA2 gene mutation positive 04/17/2017  . Family history of breast cancer   . Family history of ovarian cancer   .  Breast cancer metastasized to axillary lymph node, left ([place]) 03/29/2017  . Cough 03/10/2014  . Esophagitis 03/10/2014  . Chronic ITP (idiopathic thrombocytopenia) (HCC) 03/10/2014  . ALLERGIC REACTION, ACUTE 07/22/2008    Otelia Limes, PTA 08/09/2017, 5:01 PM  [place], [place], [address] Phone: [phone]   Fax:  [phone]  Name: GEANA WALTS MRN: [id number] Date of Birth: [birth year]

## 2017-08-09 NOTE — Telephone Encounter (Signed)
  Oncology Nurse Navigator Documentation  Navigator Location: [facility] (08/09/17 1500)   )Navigator Encounter Type: Telephone (08/09/17 1500) Telephone: [facility] Call;Appt Confirmation/Clarification (08/09/17 1500)  Discussed future appts. Pt requests early chemo appts d/t transportation and child care. Orders placed for change in time for chemo.                     Barriers/Navigation Needs: Coordination of Care (08/09/17 1500)   Interventions: Coordination of Care (08/09/17 1500)                      Time Spent with Patient: 30 (08/09/17 1500)

## 2017-08-10 ENCOUNTER — Telehealth: Payer: Self-pay | Admitting: *Deleted

## 2017-08-10 ENCOUNTER — Ambulatory Visit: Payer: BLUE CROSS/BLUE SHIELD

## 2017-08-10 NOTE — Telephone Encounter (Signed)
Pt wish to cancel chemo on 12/21 d/t daughter bday and childcare. Per Dr. Burr Medico may cancel chemo on 12/21

## 2017-08-11 ENCOUNTER — Other Ambulatory Visit: Payer: BLUE CROSS/BLUE SHIELD

## 2017-08-11 ENCOUNTER — Other Ambulatory Visit: Payer: Self-pay | Admitting: *Deleted

## 2017-08-11 ENCOUNTER — Ambulatory Visit: Payer: BLUE CROSS/BLUE SHIELD | Admitting: Hematology

## 2017-08-11 ENCOUNTER — Other Ambulatory Visit: Payer: Self-pay | Admitting: Hematology

## 2017-08-11 ENCOUNTER — Ambulatory Visit: Payer: BLUE CROSS/BLUE SHIELD

## 2017-08-11 ENCOUNTER — Ambulatory Visit (HOSPITAL_BASED_OUTPATIENT_CLINIC_OR_DEPARTMENT_OTHER): Payer: BLUE CROSS/BLUE SHIELD

## 2017-08-11 ENCOUNTER — Other Ambulatory Visit (HOSPITAL_BASED_OUTPATIENT_CLINIC_OR_DEPARTMENT_OTHER): Payer: BLUE CROSS/BLUE SHIELD

## 2017-08-11 VITALS — BP 106/67 | HR 70 | Temp 98.0°F | Resp 18

## 2017-08-11 DIAGNOSIS — C773 Secondary and unspecified malignant neoplasm of axilla and upper limb lymph nodes: Secondary | ICD-10-CM

## 2017-08-11 DIAGNOSIS — C50912 Malignant neoplasm of unspecified site of left female breast: Secondary | ICD-10-CM | POA: Diagnosis not present

## 2017-08-11 DIAGNOSIS — Z5111 Encounter for antineoplastic chemotherapy: Secondary | ICD-10-CM | POA: Diagnosis not present

## 2017-08-11 DIAGNOSIS — Z95828 Presence of other vascular implants and grafts: Secondary | ICD-10-CM

## 2017-08-11 LAB — COMPREHENSIVE METABOLIC PANEL
ALT: 17 U/L (ref 0–55)
AST: 16 U/L (ref 5–34)
Albumin: 3.9 g/dL (ref 3.5–5.0)
Alkaline Phosphatase: 76 U/L (ref 40–150)
Anion Gap: 10 mEq/L (ref 3–11)
BUN: 13.1 mg/dL (ref 7.0–26.0)
CO2: 22 mEq/L (ref 22–29)
Calcium: 9 mg/dL (ref 8.4–10.4)
Chloride: 107 mEq/L (ref 98–109)
Creatinine: 0.8 mg/dL (ref 0.6–1.1)
EGFR: >60 mL/min/{1.73_m2} (ref >60)
Glucose: 97 mg/dl (ref 70–140)
Potassium: 4.1 mEq/L (ref 3.5–5.1)
Sodium: 140 mEq/L (ref 136–145)
Total Bilirubin: 0.23 mg/dL (ref 0.20–1.20)
Total Protein: 6.4 g/dL (ref 6.4–8.3)

## 2017-08-11 LAB — CBC WITH DIFFERENTIAL/PLATELET
BASO%: 1.1 % (ref 0.0–2.0)
Basophils Absolute: 0.1 10*3/uL (ref 0.0–0.1)
EOS%: 0 % (ref 0.0–7.0)
Eosinophils Absolute: 0 10*3/uL (ref 0.0–0.5)
HCT: 37.2 % (ref 34.8–46.6)
HGB: 12.7 g/dL (ref 11.6–15.9)
LYMPH%: 23.1 % (ref 14.0–49.7)
MCH: 31.1 pg (ref 25.1–34.0)
MCHC: 34.1 g/dL (ref 31.5–36.0)
MCV: 91.2 fL (ref 79.5–101.0)
MONO#: 0.7 10*3/uL (ref 0.1–0.9)
MONO%: 16.2 % — ABNORMAL HIGH (ref 0.0–14.0)
NEUT#: 2.6 10*3/uL (ref 1.5–6.5)
NEUT%: 59.6 % (ref 38.4–76.8)
Platelets: 266 10*3/uL (ref 145–400)
RBC: 4.08 10*6/uL (ref 3.70–5.45)
RDW: 16.5 % — ABNORMAL HIGH (ref 11.2–14.5)
WBC: 4.4 10*3/uL (ref 3.9–10.3)
lymph#: 1 10*3/uL (ref 0.9–3.3)

## 2017-08-11 MED ORDER — PALONOSETRON HCL INJECTION 0.25 MG/5ML
INTRAVENOUS | Status: AC
  Filled 2017-08-11: qty 5

## 2017-08-11 MED ORDER — DEXAMETHASONE SODIUM PHOSPHATE 10 MG/ML IJ SOLN
10.0000 mg | Freq: Once | INTRAMUSCULAR | Status: AC
  Administered 2017-08-11: 10 mg via INTRAVENOUS

## 2017-08-11 MED ORDER — PALONOSETRON HCL INJECTION 0.25 MG/5ML
0.2500 mg | Freq: Once | INTRAVENOUS | Status: AC
  Administered 2017-08-11: 0.25 mg via INTRAVENOUS

## 2017-08-11 MED ORDER — DEXAMETHASONE SODIUM PHOSPHATE 10 MG/ML IJ SOLN
INTRAMUSCULAR | Status: AC
  Filled 2017-08-11: qty 1

## 2017-08-11 MED ORDER — SODIUM CHLORIDE 0.9 % IV SOLN
199.4000 mg | Freq: Once | INTRAVENOUS | Status: AC
  Administered 2017-08-11: 200 mg via INTRAVENOUS
  Filled 2017-08-11: qty 20

## 2017-08-11 MED ORDER — PACLITAXEL PROTEIN-BOUND CHEMO INJECTION 100 MG
80.0000 mg/m2 | Freq: Once | INTRAVENOUS | Status: AC
  Administered 2017-08-11: 125 mg via INTRAVENOUS
  Filled 2017-08-11: qty 25

## 2017-08-11 MED ORDER — PROCHLORPERAZINE MALEATE 10 MG PO TABS: 10.0000 mg | ORAL_TABLET | Freq: Four times a day (QID) | ORAL | 1 refills | Status: DC | PRN

## 2017-08-11 MED ORDER — SODIUM CHLORIDE 0.9% FLUSH
10.0000 mL | Freq: Once | INTRAVENOUS | Status: AC
  Administered 2017-08-11: 10 mL via INTRAVENOUS
  Filled 2017-08-11: qty 10

## 2017-08-11 MED ORDER — SODIUM CHLORIDE 0.9% FLUSH
10.0000 mL | INTRAVENOUS | Status: DC | PRN
  Administered 2017-08-11: 10 mL
  Filled 2017-08-11: qty 10

## 2017-08-11 MED ORDER — HEPARIN SOD (PORK) LOCK FLUSH 100 UNIT/ML IV SOLN
500.0000 [IU] | Freq: Once | INTRAVENOUS | Status: AC | PRN
  Administered 2017-08-11: 500 [IU]
  Filled 2017-08-11: qty 5

## 2017-08-11 MED ORDER — SODIUM CHLORIDE 0.9 % IV SOLN
Freq: Once | INTRAVENOUS | Status: AC
  Administered 2017-08-11: 12:00:00 via INTRAVENOUS

## 2017-08-11 NOTE — Patient Instructions (Signed)
[facility] Discharge Instructions for Patients Receiving Chemotherapy  Today you received the following chemotherapy agents: Paclitaxel (Abraxane) and Carboplatin (Paraplatin).  To help prevent nausea and vomiting after your treatment, we encourage you to take your nausea medication as prescribed.  If you develop nausea and vomiting that is not controlled by your nausea medication, call the clinic.   BELOW ARE SYMPTOMS THAT SHOULD BE REPORTED IMMEDIATELY:  *FEVER GREATER THAN 100.5 F  *CHILLS WITH OR WITHOUT FEVER  NAUSEA AND VOMITING THAT IS NOT CONTROLLED WITH YOUR NAUSEA MEDICATION  *UNUSUAL SHORTNESS OF BREATH  *UNUSUAL BRUISING OR BLEEDING  TENDERNESS IN MOUTH AND THROAT WITH OR WITHOUT PRESENCE OF ULCERS  *URINARY PROBLEMS  *BOWEL PROBLEMS  UNUSUAL RASH Items with * indicate a potential emergency and should be followed up as soon as possible.  Feel free to call the clinic should you have any questions or concerns. The clinic phone number is [phone].  Please show the [place] at check-in to the Emergency Department and triage nurse.  Nanoparticle Albumin-Bound Paclitaxel injection What is this medicine? NANOPARTICLE ALBUMIN-BOUND PACLITAXEL (Na no PAHR ti kuhl al BYOO muhn-bound PAK li TAX el) is a chemotherapy drug. It targets fast dividing cells, like cancer cells, and causes these cells to die. This medicine is used to treat advanced breast cancer and advanced lung cancer. This medicine may be used for other purposes; ask your health care provider or pharmacist if you have questions. COMMON BRAND NAME(S): Abraxane What should I tell my health care provider before I take this medicine? They need to know if you have any of these conditions: -kidney disease -liver disease -low blood counts, like low platelets, red blood cells, or white blood cells -recent or ongoing radiation therapy -an unusual or allergic reaction to paclitaxel,  albumin, other chemotherapy, other medicines, foods, dyes, or preservatives -pregnant or trying to get pregnant -breast-feeding How should I use this medicine? This drug is given as an infusion into a vein. It is administered in a hospital or clinic by a specially trained health care professional. Talk to your pediatrician regarding the use of this medicine in children. Special care may be needed. Overdosage: If you think you have taken too much of this medicine contact a poison control center or emergency room at once. NOTE: This medicine is only for you. Do not share this medicine with others. What if I miss a dose? It is important not to miss your dose. Call your doctor or health care professional if you are unable to keep an appointment. What may interact with this medicine? -cyclosporine -diazepam -ketoconazole -medicines to increase blood counts like filgrastim, pegfilgrastim, sargramostim -other chemotherapy drugs like cisplatin, doxorubicin, epirubicin, etoposide, teniposide, vincristine -quinidine -testosterone -vaccines -verapamil Talk to your doctor or health care professional before taking any of these medicines: -acetaminophen -aspirin -ibuprofen -ketoprofen -naproxen This list may not describe all possible interactions. Give your health care provider a list of all the medicines, herbs, non-prescription drugs, or dietary supplements you use. Also tell them if you smoke, drink alcohol, or use illegal drugs. Some items may interact with your medicine. What should I watch for while using this medicine? Your condition will be monitored carefully while you are receiving this medicine. You will need important blood work done while you are taking this medicine. This medicine can cause serious allergic reactions. If you experience allergic reactions like skin rash, itching or hives, swelling of the face, lips, or tongue, tell your doctor or  health care professional right away. In  some cases, you may be given additional medicines to help with side effects. Follow all directions for their use. This drug may make you feel generally unwell. This is not uncommon, as chemotherapy can affect healthy cells as well as cancer cells. Report any side effects. Continue your course of treatment even though you feel ill unless your doctor tells you to stop. Call your doctor or health care professional for advice if you get a fever, chills or sore throat, or other symptoms of a cold or flu. Do not treat yourself. This drug decreases your body's ability to fight infections. Try to avoid being around people who are sick. This medicine may increase your risk to bruise or bleed. Call your doctor or health care professional if you notice any unusual bleeding. Be careful brushing and flossing your teeth or using a toothpick because you may get an infection or bleed more easily. If you have any dental work done, tell your dentist you are receiving this medicine. Avoid taking products that contain aspirin, acetaminophen, ibuprofen, naproxen, or ketoprofen unless instructed by your doctor. These medicines may hide a fever. Do not become pregnant while taking this medicine. Women should inform their doctor if they wish to become pregnant or think they might be pregnant. There is a potential for serious side effects to an unborn child. Talk to your health care professional or pharmacist for more information. Do not breast-feed an infant while taking this medicine. Men are advised not to father a child while receiving this medicine. What side effects may I notice from receiving this medicine? Side effects that you should report to your doctor or health care professional as soon as possible: -allergic reactions like skin rash, itching or hives, swelling of the face, lips, or tongue -low blood counts - This drug may decrease the number of white blood cells, red blood cells and platelets. You may be at  increased risk for infections and bleeding. -signs of infection - fever or chills, cough, sore throat, pain or difficulty passing urine -signs of decreased platelets or bleeding - bruising, pinpoint red spots on the skin, black, tarry stools, nosebleeds -signs of decreased red blood cells - unusually weak or tired, fainting spells, lightheadedness -breathing problems -changes in vision -chest pain -high or low blood pressure -mouth sores -nausea and vomiting -pain, swelling, redness or irritation at the injection site -pain, tingling, numbness in the hands or feet -slow or irregular heartbeat -swelling of the ankle, feet, hands Side effects that usually do not require medical attention (report to your doctor or health care professional if they continue or are bothersome): -aches, pains -changes in the color of fingernails -diarrhea -hair loss -loss of appetite This list may not describe all possible side effects. Call your doctor for medical advice about side effects. You may report side effects to FDA at 1-800-FDA-1088. Where should I keep my medicine? This drug is given in a hospital or clinic and will not be stored at home. NOTE: This sheet is a summary. It may not cover all possible information. If you have questions about this medicine, talk to your doctor, pharmacist, or health care provider.  2018 [employer] Standard (2015-06-24 10:05:20)  Carboplatin injection What is this medicine? CARBOPLATIN (KAR boe pla tin) is a chemotherapy drug. It targets fast dividing cells, like cancer cells, and causes these cells to die. This medicine is used to treat ovarian cancer and many other cancers. This medicine may be used for  other purposes; ask your health care provider or pharmacist if you have questions. COMMON BRAND NAME(S): Paraplatin What should I tell my health care provider before I take this medicine? They need to know if you have any of these conditions: -blood  disorders -hearing problems -kidney disease -recent or ongoing radiation therapy -an unusual or allergic reaction to carboplatin, cisplatin, other chemotherapy, other medicines, foods, dyes, or preservatives -pregnant or trying to get pregnant -breast-feeding How should I use this medicine? This drug is usually given as an infusion into a vein. It is administered in a hospital or clinic by a specially trained health care professional. Talk to your pediatrician regarding the use of this medicine in children. Special care may be needed. Overdosage: If you think you have taken too much of this medicine contact a poison control center or emergency room at once. NOTE: This medicine is only for you. Do not share this medicine with others. What if I miss a dose? It is important not to miss a dose. Call your doctor or health care professional if you are unable to keep an appointment. What may interact with this medicine? -medicines for seizures -medicines to increase blood counts like filgrastim, pegfilgrastim, sargramostim -some antibiotics like amikacin, gentamicin, neomycin, streptomycin, tobramycin -vaccines Talk to your doctor or health care professional before taking any of these medicines: -acetaminophen -aspirin -ibuprofen -ketoprofen -naproxen This list may not describe all possible interactions. Give your health care provider a list of all the medicines, herbs, non-prescription drugs, or dietary supplements you use. Also tell them if you smoke, drink alcohol, or use illegal drugs. Some items may interact with your medicine. What should I watch for while using this medicine? Your condition will be monitored carefully while you are receiving this medicine. You will need important blood work done while you are taking this medicine. This drug may make you feel generally unwell. This is not uncommon, as chemotherapy can affect healthy cells as well as cancer cells. Report any side effects.  Continue your course of treatment even though you feel ill unless your doctor tells you to stop. In some cases, you may be given additional medicines to help with side effects. Follow all directions for their use. Call your doctor or health care professional for advice if you get a fever, chills or sore throat, or other symptoms of a cold or flu. Do not treat yourself. This drug decreases your body's ability to fight infections. Try to avoid being around people who are sick. This medicine may increase your risk to bruise or bleed. Call your doctor or health care professional if you notice any unusual bleeding. Be careful brushing and flossing your teeth or using a toothpick because you may get an infection or bleed more easily. If you have any dental work done, tell your dentist you are receiving this medicine. Avoid taking products that contain aspirin, acetaminophen, ibuprofen, naproxen, or ketoprofen unless instructed by your doctor. These medicines may hide a fever. Do not become pregnant while taking this medicine. Women should inform their doctor if they wish to become pregnant or think they might be pregnant. There is a potential for serious side effects to an unborn child. Talk to your health care professional or pharmacist for more information. Do not breast-feed an infant while taking this medicine. What side effects may I notice from receiving this medicine? Side effects that you should report to your doctor or health care professional as soon as possible: -allergic reactions like skin rash, itching  or hives, swelling of the face, lips, or tongue -signs of infection - fever or chills, cough, sore throat, pain or difficulty passing urine -signs of decreased platelets or bleeding - bruising, pinpoint red spots on the skin, black, tarry stools, nosebleeds -signs of decreased red blood cells - unusually weak or tired, fainting spells, lightheadedness -breathing problems -changes in  hearing -changes in vision -chest pain -high blood pressure -low blood counts - This drug may decrease the number of white blood cells, red blood cells and platelets. You may be at increased risk for infections and bleeding. -nausea and vomiting -pain, swelling, redness or irritation at the injection site -pain, tingling, numbness in the hands or feet -problems with balance, talking, walking -trouble passing urine or change in the amount of urine Side effects that usually do not require medical attention (report to your doctor or health care professional if they continue or are bothersome): -hair loss -loss of appetite -metallic taste in the mouth or changes in taste This list may not describe all possible side effects. Call your doctor for medical advice about side effects. You may report side effects to FDA at 1-800-FDA-1088. Where should I keep my medicine? This drug is given in a hospital or clinic and will not be stored at home. NOTE: This sheet is a summary. It may not cover all possible information. If you have questions about this medicine, talk to your doctor, pharmacist, or health care provider.  2018 [employer]/Gold Standard (2007-11-27 14:38:05)

## 2017-08-11 NOTE — Progress Notes (Signed)
At approx 1329 c/o of "really bad head ache".  "I feel like my head is going to explode".  Abraxane paused and NS running to gravity.  VSS.  Pt was also wearing ice cap at time of c/o headache. 2-3 minutes after stopping infusion pt states headache has "completely gone".

## 2017-08-15 ENCOUNTER — Telehealth: Payer: Self-pay

## 2017-08-15 NOTE — Telephone Encounter (Signed)
Called and left a message that the earlier appt times are not avail.  Asked that she stp by scheduling or check with [place] at another date to check for cancellations.

## 2017-08-17 ENCOUNTER — Ambulatory Visit: Payer: BLUE CROSS/BLUE SHIELD | Admitting: Hematology

## 2017-08-17 ENCOUNTER — Other Ambulatory Visit: Payer: BLUE CROSS/BLUE SHIELD

## 2017-08-17 ENCOUNTER — Encounter: Payer: BLUE CROSS/BLUE SHIELD | Admitting: Physical Therapy

## 2017-08-17 NOTE — Progress Notes (Signed)
[place]  [phone] Fax:([phone]) [phone]  Clinic Follow up Note   Patient Care Team: Dorena Cookey, MD as PCP - [name], MD as Consulting Physician (General Surgery) [name], MD as Consulting Physician (Hematology) Kyung Rudd, MD as Consulting Physician (Radiation Oncology) Brien Few, MD as Consulting Physician (Obstetrics and Gynecology)   Date of Service:  08/18/2017  CHIEF COMPLAINTS:  Follow up for Breast cancer metastasized to axillary lymph node, left   Oncology History   Cancer Staging Breast cancer metastasized to axillary lymph node, left [facility]) Staging form: Breast, AJCC 8th Edition - Clinical stage from 03/22/2017: Stage Unknown (cTX, cN1, cM0, GX, ER: Positive, PR: Positive, HER2: Negative) - Signed by [name], MD on 03/29/2017 - Pathologic stage from 04/27/2017: Stage IB (pT1c, pN2a, cM0, G2, ER: Positive, PR: Positive, HER2: Negative) - Signed by [name], MD on 05/25/2017 \     Breast cancer metastasized to axillary lymph node, left ([place])   03/21/2017 Mammogram    Diagnostic mammogram bilateral 03/21/17 IMPRESSION: INCOMPLETE - ADDITIONAL IMAGING EVALUATION NEEDED 1. The 0.6 cm oval mass in the right breast at 11 o'clock middle depth is indeterminate. An ultrasound is recommended.  2. The 0.5cm round focal asymmetry in the left breast central to the nipple middle depth is indeterminate. An ultrasound is recommended.  3. Ultrasound of the palpable abnormalities in the axilla and far left lateral breast is recommended.  4. Multiple clusters of pleomorphic calcifications in the left upper outer breast anterior depth spanning a 2.5 cm area are highly suggestive of malignancy. A stereotactic biopsy is recommended.        03/21/2017 Imaging    US Breast bilateral 03/21/17 IMPRESSION: HIGHLY SUGGESTIVE OF MALIGNANCY 1. Six distinct abnormal left axillary nodes, 2 of which correspond to the palpable lumps felt by the  patient. Findings concerning for metastatic adenopathy. Ultrasound guided biopsy of one of these nodes is recommended.  2. Small 5 mm palpable, oval mass in the left breast 3:00 areolar margin is at an intermediate suspicion. Ultrasound guided biopsy is recommended.  3. Isoechoic 8 mm mass in the right breast 10:00 5 cm from the nipple is also an intermediate suspicion for malignancy, and ultrasound guided biopsy is recommended.  4. Stereotactic guided biopsy for the highly suspicious calcifications in the left breast also recommended as detailed in the mammography report.        03/22/2017 Initial Biopsy    Diagnosis 03/22/17 Lymph node, needle/core biopsy, left axillary node -METASTATIC CARCINOMA, SEE COMMENT Microscopic Comment      03/22/2017 Receptors her2    Lymph node biopsy ER 95% positive, strong staining, PR 5% positive, weak staining, HER-2 negative, with HER2/CEP17 ratio 1.23 and copy #3.95.      03/27/2017 Pathology Results    Diagnosis 03/27/17 1. Breast, left, needle core biopsy -FIBROADENOMATOID NODULE WITH CALCIFICATIONS 2.Breast, left, needle core biopsy -DUCTAL CARCINOMA IN SITU, HIGH GRADE -SEE COMMENT  3. Breast, right, needle, core biopsy -FIBROADENOMA -NO MALIGNANCY IDENTIFIED       03/29/2017 Initial Diagnosis    Breast cancer metastasized to axillary lymph node, left ([place])      04/07/2017 Imaging    CT CAP 04/07/17 IMPRESSION: 1. Several prominent left axillary lymph nodes which may correspond to biopsy proven axillary lymph node metastasis. 2. No thoracic adenopathy identified or evidence of distant metastatic disease.      04/07/2017 Imaging    Bone whole body scan 04/07/17 IMPRESSION: Negative for evidence of  osseous metastatic disease.      04/17/2017 Genetic Testing    BRCA2 c.[id number] (p.Glu260Serfs*15) pathogenic mutation identified on the 9 gene STAT panel.  The STAT Breast cancer panel offered by [facility] includes sequencing and rearrangement  analysis for the following 9 genes:  ATM, BRCA1, BRCA2, CDH1, CHEK2, PALB2, PTEN, STK11 and TP53.   The report date is April 17, 2017.  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  The Hereditary Gene Panel offered by [facility] includes sequencing and/or deletion duplication testing of the following 46 genes: APC, ATM, AXIN2, BARD1, BMPR1A, BRCA1, BRCA2, BRIP1, CDH1, CDKN2A (p14ARF), CDKN2A (p16INK4a), CHEK2, CTNNA1, DICER1, EPCAM (Deletion/duplication testing only), GREM1 (promoter region deletion/duplication testing only), KIT, MEN1, MLH1, MSH2, MSH3, MSH6, MUTYH, NBN, NF1, NHTL1, PALB2, PDGFRA, PMS2, POLD1, POLE, PTEN, RAD50, RAD51C, RAD51D, SDHB, SDHC, SDHD, SMAD4, SMARCA4. STK11, TP53, TSC1, TSC2, and VHL.  The following genes were evaluated for sequence changes only: SDHA and HOXB13 c.251G>A variant only.  The report date is April 29, 2017.       04/27/2017 Surgery    Surgey 04/27/17 BILATERAL SKIN SPARING MASTECTOMY and LEFT AXILLARY LYMPH NODE and BILATERAL BREAST RECONSTRUCTION WITH PLACEMENT OF TISSUE EXPANDER AND FLEX HD (ACELLULAR HYDRATED DERMIS) Bilateral BY Dr. Barry Dienes and Dr. Marla Roe       04/27/2017 Pathology Results     Diagnosis  04/27/17 1. Breast, simple mastectomy, Right - FIBROCYSTIC CHANGES WITH ADENOSIS. - USUAL DUCTAL HYPERPLASIA. - HEALING BIOPSY SITE. - THERE IS NO EVIDENCE OF MALIGNANCY. 2. Breast, simple mastectomy, Left - INVASIVE DUCTAL CARCINOMA, GRADE II/III, SPANNING 1.2 CM. - DUCTAL CARCINOMA IN SITU WITH CALCIFICATIONS, HIGH GRADE. - INVASIVE DUCTAL CARCINOMA IS FOCALLY PRESENT AT THE ANTERIOR MARGIN. - DUCTAL CARCINOMA IN SITU IS BROADLY 0.1 CM TO THE POSTERIOR MARGIN. - SEE ONCOLOGY TABLE BELOW. 3. Lymph nodes, regional resection, Left axillary - METASTATIC CARCINOMA IN 8 OF 21 LYMPH NODES (8/21). - SEE COMMENT      06/09/2017 -  Chemotherapy     Adriamycin and Cytoxan every 2 weeks, for 4 cycles 06/09/17-07/21/17, followed by weekly Abraxane and carboplatin for 12 weeks starting 08/11/17.         HISTORY OF PRESENTING ILLNESS: 03/29/17 Madison Savage 46 y.o. female is here because of newly diagnosed Breast cancer metastasized to axillary lymph node, left. She presents to Breast Clinic today with her husband. She felt the lump herself June 22nd. It was soft and "squishy" and it moved. First in the left breast and then four days latter in the left axilla. She feels they have gotten smaller.   In the past she was diagnosed with being ITP positive. Period has been irregular more since her last miscarriage in 2015. She has MTHFR gene. In pregnancy her plt counts have gone low. She has not seen homologist for this. All 3 children were vaginal births.  Today she reports that with the lumps she feels a dull ache that comes and goes. She has not noticed any nipple or skin change. She had not had a mammogram since she was 24. Her PCP was going to set her up with a mammogram last year. Dr. Ronita Hipps is her OB and Dr. Sherren Mocha is her current PCP. She feels no change in appetite or weight loss. She quit smoking 3 weeks ago. She works out Scientist, product/process development. She reports her last attempt at the MRI, the contrast made her feel like she could not breathe, her head  was pounding and she felt hot all over and she was shaking. The trouble breathing only lasted while she was in the MRI.  Husband shared concern with insurance and financial. And referrals to second opinions.    GYN HISTORY  Menarchal: 16 LMP: 03/05/2017 Contraceptive: none HRT: N/A G7P3: - 3 miscarriages, 1 abortion    CURRENT THERAPY: Adriamycin and Cytoxan every 2 weeks, for 4 cycles 06/09/17-07/21/17, followed by weekly Abraxane and carboplatin for 12 weeks starting 08/11/17. Will hold Carbo with cycle 2  INTERVAL HISTORY:  Madison Savage is here for a follow up and cycle 2 Abraxane and  carboplatin. She presents in the infusion room accompanied by her husband. She notes she still has cough which has improved. She also has nasal congestion. She asked about the use of Carbo in her treatment and if it is necessary. She notes a pinching feeling that last 2 days in her chest. She has not had this since her breast surgery. She notes she slept a lot this last cycle, for about 2 days. Day 3-4 she was sick and then very fatigued. She reports she just recovered.  She notes she drinks plenty of water but felt slightly dehydrated last week. She would like to skip treatment with week of 09/22/17.     MEDICAL HISTORY:  Past Medical History:  Diagnosis Date  . Anemia   . Anxiety   . Cancer ([place]) 03/2017   left breast cancer  . Chronic ITP (idiopathic thrombocytopenia) ([facility])   . Family history of breast cancer   . Family history of ovarian cancer   . Headache    last migraine...9/24  . Heart murmur    at birth.  none now  . MTHFR mutation [facility])     SURGICAL HISTORY: Past Surgical History:  Procedure Laterality Date  . AXILLARY LYMPH NODE DISSECTION Left 04/27/2017   Procedure: AXILLARY LYMPH NODE DISSECTION;  Surgeon: [name], MD;  Location: [place];  Service: General;  Laterality: Left;  . BILATERAL TOTAL MASTECTOMY WITH AXILLARY LYMPH NODE DISSECTION Bilateral 04/27/2017   Procedure: SKIN SPARING MASTECTOMY;  Surgeon: [name], MD;  Location: [place];  Service: General;  Laterality: Bilateral;  . BREAST RECONSTRUCTION WITH PLACEMENT OF TISSUE EXPANDER AND FLEX HD (ACELLULAR HYDRATED DERMIS) Bilateral 04/27/2017   Procedure: BILATERAL BREAST RECONSTRUCTION WITH PLACEMENT OF TISSUE EXPANDER AND FLEX HD (ACELLULAR HYDRATED DERMIS);  Surgeon: Wallace Going, DO;  Location: [place];  Service: Plastics;  Laterality: Bilateral;  . BREAST SURGERY    . MASTECTOMY    . PORTACATH PLACEMENT Right 06/01/2017   Procedure:  INSERTION PORT-A-CATH RIGHT SUBCLAVIAN;  Surgeon: [name], MD;  Location: [place];  Service: General;  Laterality: Right;  . WISDOM TOOTH EXTRACTION      SOCIAL HISTORY: Social History   Socioeconomic History  . Marital status: Married    Spouse name: Not on file  . Number of children: 3  . Years of education: Not on file  . Highest education level: Not on file  Social Needs  . Financial resource strain: Not on file  . Food insecurity - worry: Not on file  . Food insecurity - inability: Not on file  . Transportation needs - medical: Not on file  . Transportation needs - non-medical: Not on file  Occupational History    Employer: UNEMPLOYED  Tobacco Use  . Smoking status: Former Smoker    Packs/day: 0.50    Years: 20.00  Pack years: 10.00    Types: Cigarettes    Last attempt to quit: 03/05/2017    Years since quitting: 0.4  . Smokeless tobacco: Never Used  Substance and Sexual Activity  . Alcohol use: No  . Drug use: No  . Sexual activity: Yes    Partners: Male    Birth control/protection: None  Other Topics Concern  . Not on file  Social History Narrative   Stay at home mom   3 children ages 54, 55, 64    FAMILY HISTORY: Family History  Problem Relation Age of Onset  . Breast cancer Paternal Aunt 30  . Ovarian cancer Paternal Aunt 41  . Cancer Paternal Grandmother        breast cancer   . Cancer Maternal Aunt        lymphoma   . Cancer Paternal Grandfather        throat cancer   . Cancer Paternal Aunt        lung cancer  . Cirrhosis Father   . AAA (abdominal aortic aneurysm) Maternal Grandfather   . Leukemia Paternal Aunt     ALLERGIES:  is allergic to clindamycin; penicillins; and codeine.  MEDICATIONS:  Current Outpatient Medications  Medication Sig Dispense Refill  . azithromycin (ZITHROMAX Z-PAK) 250 MG tablet Take as directed 6 each 0  . chlorpheniramine-HYDROcodone (TUSSIONEX) 10-8 MG/5ML SUER Take 5 mLs by mouth at bedtime as needed for  cough. 115 mL 0  . Cyanocobalamin (VITAMIN B-12 PO) Take 1 tablet by mouth daily.     . diazepam (VALIUM) 2 MG tablet Take 1 tablet (2 mg total) by mouth every 6 (six) hours as needed for anxiety or muscle spasms. 30 tablet 0  . HYDROcodone-acetaminophen (NORCO/VICODIN) 5-325 MG tablet Take 1 tablet by mouth every 4 (four) hours as needed for moderate pain. 30 tablet 0  . Multiple Vitamins-Calcium (ONE-A-DAY WOMENS PO) Take 1 tablet by mouth daily.    Marland Kitchen OVER THE COUNTER MEDICATION Take 1 capsule by mouth daily. Biotin/MSM    . prochlorperazine (COMPAZINE) 10 MG tablet Take 1 tablet (10 mg total) by mouth every 6 (six) hours as needed for nausea or vomiting. 30 tablet 1  . traMADol (ULTRAM) 50 MG tablet Take 1 tablet (50 mg total) by mouth every 6 (six) hours as needed. 15 tablet 1  . vitamin C (ASCORBIC ACID) 500 MG tablet Take 2,000 mg by mouth daily.      Current Facility-Administered Medications  Medication Dose Route Frequency Provider Last Rate Last Dose  . LORazepam (ATIVAN) injection 0.5 mg  0.5 mg Intravenous Once [name], MD       Facility-Administered Medications Ordered in Other Visits  Medication Dose Route Frequency Provider Last Rate Last Dose  . sodium chloride flush (NS) 0.9 % injection 10 mL  10 mL Intravenous PRN [name], MD   10 mL at 07/21/17 1323    REVIEW OF SYSTEMS:   Constitutional: Denies fevers, chills or abnormal night sweats  Eyes: Denies blurriness of vision, double vision or watery eyes Ears, nose, mouth, throat, and face: Denies mucositis or sore throat (+) nasal congestion  Respiratory: Denies dyspnea or wheezes (+) dry cough improved Cardiovascular: Denies palpitation, chest discomfort or lower extremity swelling Gastrointestinal:  Denies nausea, heartburn or change in bowel habits Skin: Denies abnormal skin rashes (+) palpable boil in right axilla  Lymphatics: Denies new lymphadenopathy or easy bruising Breast: (+) pinching in chest post  surgery Neurological:Denies numbness, tingling or new weaknesses  Behavioral/Psych: Mood is stable, no new changes  All other systems were reviewed with the patient and are negative.   PHYSICAL EXAMINATION: ECOG PERFORMANCE STATUS: 0 - Asymptomatic  There were no vitals filed for this visit. There were no vitals filed for this visit.   GENERAL:alert, no distress and comfortable SKIN: skin color, texture, turgor are normal, no rashes or significant lesions (+) a very small boil in right axilla  EYES: normal, conjunctiva are pink and non-injected, sclera clear OROPHARYNX:no exudate, no erythema and lips, buccal mucosa, and tongue normal  NECK: supple, thyroid normal size, non-tender, without nodularity LYMPH:  no palpable lymphadenopathy in the cervical, axillary or inguinal LUNGS: clear to auscultation and percussion with normal breathing effort HEART: regular rate & rhythm and no murmurs and no lower extremity edema ABDOMEN:abdomen soft, non-tender and normal bowel sounds Musculoskeletal:no cyanosis of digits and no clubbing  PSYCH: alert & oriented x 3 with fluent speech NEURO: no focal motor/sensory deficits Breast: Status post bilateral mastectomy and tissue expander placements, surgical incisions have healed well, no discharge or skin erythema.   LABORATORY DATA:  I have reviewed the data as listed CBC Latest Ref Rng & Units 08/18/2017 08/11/2017 08/03/2017  WBC 3.9 - 10.3 10e3/uL 3.6(L) 4.4 4.4  Hemoglobin 11.6 - 15.9 g/dL 11.8 12.7 12.8  Hematocrit 34.8 - 46.6 % 35.4 37.2 38.3  Platelets 145 - 400 10e3/uL 201 266 186    CMP Latest Ref Rng & Units 08/18/2017 08/11/2017 08/03/2017  Glucose 70 - 140 mg/dl 95 97 89  BUN 7.0 - 26.0 mg/dL 12.4 13.1 12.7  Creatinine 0.6 - 1.1 mg/dL 0.8 0.8 0.8  Sodium 136 - 145 mEq/L 141 140 139  Potassium 3.5 - 5.1 mEq/L 4.1 4.1 4.4  Chloride 101 - 111 mmol/L - - -  CO2 22 - 29 mEq/L _0 Calcium 8.4 - 10.4 mg/dL 9.0 9.0 9.7  Total  Protein 6.4 - 8.3 g/dL 6.1(L) 6.4 6.8  Total Bilirubin 0.20 - 1.20 mg/dL <0.22 0.23 <0.22  Alkaline Phos 40 - 150 U/L 63 76 95  AST 5 - 34 U/L _1 ALT 0 - 55 U/L _2 PATHOLOGY   Diagnosis  04/27/17 1. Breast, simple mastectomy, Right - FIBROCYSTIC CHANGES WITH ADENOSIS. - USUAL DUCTAL HYPERPLASIA. - HEALING BIOPSY SITE. - THERE IS NO EVIDENCE OF MALIGNANCY. 2. Breast, simple mastectomy, Left - INVASIVE DUCTAL CARCINOMA, GRADE II/III, SPANNING 1.2 CM. - DUCTAL CARCINOMA IN SITU WITH CALCIFICATIONS, HIGH GRADE. - INVASIVE DUCTAL CARCINOMA IS FOCALLY PRESENT AT THE ANTERIOR MARGIN. - DUCTAL CARCINOMA IN SITU IS BROADLY 0.1 CM TO THE POSTERIOR MARGIN. - SEE ONCOLOGY TABLE BELOW. 3. Lymph nodes, regional resection, Left axillary - METASTATIC CARCINOMA IN 8 OF 21 LYMPH NODES (8/21). - SEE COMMENT. Microscopic Comment 2. BREAST, INVASIVE TUMOR Procedure: Simple mastectomy and axillary lymph node resections. Laterality: Left. Tumor Size: 1.2 cm (glass slide measurement). Histologic Type: Ductal Grade: II Tubular Differentiation: 3 Nuclear Pleomorphism: 3 Mitotic Count: 1 Ductal Carcinoma in Situ (DCIS): Present, high grade, extensive. Extent of Tumor: Confined to breast parenchyma. Margins: Invasive carcinoma, distance from closest margin: Focally present at the anterior margin. DCIS, distance from closest margin: Broadly less than 0.1 cm to the posterior margin. Regional Lymph Nodes: Number of Lymph Nodes Examined: 21 1 of 4 FINAL for Madison Savage, Madison Savage [phone]) Microscopic Comment(continued) Lymph Nodes with Macrometastases: 6 Lymph Nodes with Micrometastases: 0 Lymph Nodes with Isolated Tumor Cells: 2 Breast Prognostic Profile:  [phone] Estrogen Receptor: 95%, strong. Progesterone Receptor: 5%, strong. Her2: No amplification was detected. The ratio is 1.23. Ki-67: 15%. Best tumor block for sendout testing: 2G Pathologic Stage Classification (pTNM,  AJCC 8th Edition): Primary Tumor (pT): pT1c Regional Lymph Nodes (pN): pN2a Distant Metastases (pM): pMX Comments: Grossly, there is a 2.5 cm focus of indurated tissue identified. Histologic examination of this area reveals predominately high grade ductal carcinoma in situ with calcifications. There is associated invasive ductal carcinoma, which is E-cadherin positive, spanning 1.2 cm. (JBK:gt, 05/02/17) 3. Immunohistochemical stains for cytokeratin AE1/AE3 performed on multiple blocks on part 3 help highlight the presence of metastatic carcinoma. (JBK:gt, 05/02/17)   Diagnosis 03/27/17 1. Breast, left, needle core biopsy -FIBROADENOMATOID NODULE WITH CALCIFICATIONS 2.Breast, left, needle core biopsy -DUCTAL CARCINOMA IN SITU, HIGH GRADE -SEE COMMENT  3. Breast, right, needle, core biopsy -FIBROADENOMA -NO MALIGNANCY IDENTIFIED  Diagnosis 03/22/17 Lymph node, needle/core biopsy, left axillary node -METASTATIC CARCINOMA, SEE COMMENT Microscopic Comment Sections showed lymph node tissue mostly replaced by metastatic carcinoma characterized by sheets of epithelioid appearing cells displaying milder to moderate nuclear pleomorphism, fine chromatin, small nucleoli, and moderately abundant amphophilic to eosinophilic cyto-plasma. Immunohistochemistry staining showed positive for cytokeratin AE1/AE3, GATA-3 and estrogen receptor, with patchy positivity for cytokeratin 7 and GCDFP., Only scattered cells showed progesterone positivity. Negative for S100, melan-a, E-cadherin, cytokeratin 20, CDX-2, TTF-1 or WT-1. The findings are consistent with metastatic carcinoma of breast primary. Lack of significant e-cadherin positivity suggested lobular carcinoma.   GENETICS 04/10/17    PROCEDURES   ECHO 05/31/17 Study Conclusions - Left ventricle: The cavity size was normal. Wall thickness was   normal. Systolic function was normal. The estimated ejection   fraction was in the range of 55% to 60%. Wall  motion was normal;   there were no regional wall motion abnormalities. Impressions: - Normal LV systolic and diastolic function; global longitudinal   strain - 17.9%.  RADIOGRAPHIC STUDIES: I have personally reviewed the radiological images as listed and agreed with the findings in the report.  CT CAP 04/07/17 IMPRESSION: 1. Several prominent left axillary lymph nodes which may correspond to biopsy proven axillary lymph node metastasis. 2. No thoracic adenopathy identified or evidence of distant metastatic disease.   Bone whole body scan 04/07/17 IMPRESSION: Negative for evidence of osseous metastatic disease.  Diagnostic mammogram bilateral 03/21/17 IMPRESSION: INCOMPLETE - ADDITIONAL IMAGING EVALUATION NEEDED 1. The 0.6 cm oval mass in the right breast at 11 o'clock middle depth is indeterminate. An ultrasound is recommended.  2. The 0.5cm round focal asymmetry in the left breast central to the nipple middle depth is indeterminate. An ultrasound is recommended.  3. Ultrasound of the palpable abnormalities in the axilla and far left lateral breast is recommended.  4. Multiple clusters of pleomorphic calcifications in the left upper outer breast anterior depth spanning a 2.5 cm area are highly suggestive of malignancy. A stereotactic biopsy is recommended.   US Breast bilateral 03/21/17 IMPRESSION: HIGHLY SUGGESTIVE OF MALIGNANCY 1. Six distinct abnormal left axillary nodes, 2 of which correspond to the palpable lumps felt by the patient. Findings concerning for metastatic adenopathy. Ultrasound guided biopsy of one of these nodes is recommended.  2. Small 5 mm palpable, oval mass in the left breast 3:00 areolar margin is at an intermediate suspicion. Ultrasound guided biopsy is recommended.  3. Isoechoic 8 mm mass in the right breast 10:00 5 cm from the nipple is also an intermediate suspicion for malignancy, and ultrasound guided biopsy is recommended.  4. Stereotactic guided biopsy  for the highly suspicious calcifications in the left breast also recommended as detailed in the mammography report.    ASSESSMENT & PLAN:  Madison Savage is a 46 y.o. perimenopausal female with a history of Chronic ITP and MTHFR mutation, presented with a palpable left axillary adenopathy.  1. 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 She had a 1.2 cm left breast invasive ductal carcinoma, with 8 positive lymph nodes. Surgical margins were negative. -We discussed the risk of cancer recurrence after surgery. Due to her multiple positive lymph nodes, young age, she has high risk for recurrence.  -I recommended adjuvant chemotherapy to reduce her risk of recurrence. I recommend standard dose dense Adriamycin and Cytoxan every 2 weeks, for 4 cycles, followed by weekly Taxol or Abraxane for 12 weeks. Due to her BRCA2 mutation, and locally advanced disease, she probably would benefit from carboplatin also, I plan to add [place] to weekly Taxol. -Her Baseline 9/26/148 ECHO was normal -She started Adjuvant AC on 06/09/17, tolerating well so far, some fatigue, improves with IVF on week 2. Pt has been using Cold cap and it has been working for her thus far.  -I previously discussed the option of Taxol vs Abraxane for her weekly treatment for 12 weeks. I provided reading material to patient.  -She agreed to weekly Abraxane and Carboplatin and will continue Cold Cap. Due to worsened cold symptoms she started on 08/11/17.  -She did not tolerate the first cycle Carbo and Abraxane well first cycle. Per her request, I will hold [place] with cycle 2 to see how she tolerates Abraxane alone  -She will call us next week if she feels she needs  IV Fluids.  -Labs reviewed and adequate to proceed with cycle 2 Abraxane today  -Pt prefers not skip treatment next week, due to her daughter's birthday and holidays  -F/u in 12/28 with chemo    2. Chronic ITP  -Discovered in her Last pregnancy, with platelet count dropped to between 50,000 and 60,000, recovered, her platelet counts is around 120-150K lately  -She does not follow up with hematologist  -I'm concerned her platelet counts will drop significantly with chemotherapy, she may need steroids or additional treatment for her ITP. -Her platelets have overall remained stable while on chemotherapy.   3. 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 -She has had bilateral mastectomy, due to the high-risk of breast cancer -She planned to have hysterectomy and BSO after she completes chemo, radiation and breast reconstruction -Patient has met our genetic counselor Mrs. Florene Glen, BRCA2 G mutation screening in her children and siblings were recommended.  4. Chest pain, secondary to [facility] -Managed with Norco as needed, much better now  -Manageable, continue monitoring   5. Cold symptoms, URI  afebrile, If she develops a fever I encouraged her to contact us -I suggest OTC medication to help resolve symptoms.  -I suggest flu vaccination after symptoms resolve.  -  Symptoms worsened over Thanksgiving  -on 08/03/17 she was prescribed tussionex cough suppressant PRN at night. This may be viral but given slight improvement then worsening of symptoms, she was prescribed azithromycin pack for bacterial pathogens. -Cough much improved,  still has mild residual symptoms    PLAN:  -Proceed with cycle 2 today with Abraxane alone, hold Carbo -next lab, flush, f/u and chemo on 09/01/17    No orders of the defined types were placed in this encounter.   All questions were answered. The patient knows to call the clinic with any problems, questions or concerns. I spent 20 minutes counseling the patient face to face. The total time spent in the appointment was 30 minutes and more than 50% was on counseling.     [name], MD 08/18/2017    This document serves as a record of services personally performed by [name], MD. It was created on her behalf by Joslyn Devon, a trained medical scribe. The creation of this record is based on the scribe's personal observations and the provider's statements to them.    I have reviewed the above documentation for accuracy and completeness, and I agree with the above.

## 2017-08-18 ENCOUNTER — Ambulatory Visit (HOSPITAL_BASED_OUTPATIENT_CLINIC_OR_DEPARTMENT_OTHER): Payer: BLUE CROSS/BLUE SHIELD

## 2017-08-18 ENCOUNTER — Ambulatory Visit (HOSPITAL_BASED_OUTPATIENT_CLINIC_OR_DEPARTMENT_OTHER): Payer: BLUE CROSS/BLUE SHIELD | Admitting: Hematology

## 2017-08-18 ENCOUNTER — Encounter: Payer: Self-pay | Admitting: Hematology

## 2017-08-18 ENCOUNTER — Ambulatory Visit: Payer: BLUE CROSS/BLUE SHIELD | Admitting: Hematology

## 2017-08-18 ENCOUNTER — Ambulatory Visit: Payer: BLUE CROSS/BLUE SHIELD

## 2017-08-18 ENCOUNTER — Other Ambulatory Visit (HOSPITAL_BASED_OUTPATIENT_CLINIC_OR_DEPARTMENT_OTHER): Payer: BLUE CROSS/BLUE SHIELD

## 2017-08-18 ENCOUNTER — Encounter: Payer: Self-pay | Admitting: *Deleted

## 2017-08-18 ENCOUNTER — Other Ambulatory Visit: Payer: BLUE CROSS/BLUE SHIELD

## 2017-08-18 VITALS — BP 109/66 | HR 80 | Temp 97.5°F | Resp 17 | Wt 119.5 lb

## 2017-08-18 DIAGNOSIS — J069 Acute upper respiratory infection, unspecified: Secondary | ICD-10-CM | POA: Diagnosis not present

## 2017-08-18 DIAGNOSIS — C773 Secondary and unspecified malignant neoplasm of axilla and upper limb lymph nodes: Secondary | ICD-10-CM | POA: Diagnosis not present

## 2017-08-18 DIAGNOSIS — D693 Immune thrombocytopenic purpura: Secondary | ICD-10-CM | POA: Diagnosis not present

## 2017-08-18 DIAGNOSIS — C50912 Malignant neoplasm of unspecified site of left female breast: Secondary | ICD-10-CM

## 2017-08-18 DIAGNOSIS — R0789 Other chest pain: Secondary | ICD-10-CM

## 2017-08-18 DIAGNOSIS — Z170 Estrogen receptor positive status [ER+]: Secondary | ICD-10-CM | POA: Diagnosis not present

## 2017-08-18 DIAGNOSIS — Z5111 Encounter for antineoplastic chemotherapy: Secondary | ICD-10-CM

## 2017-08-18 DIAGNOSIS — Z95828 Presence of other vascular implants and grafts: Secondary | ICD-10-CM | POA: Insufficient documentation

## 2017-08-18 LAB — COMPREHENSIVE METABOLIC PANEL
ALT: 13 U/L (ref 0–55)
AST: 13 U/L (ref 5–34)
Albumin: 3.9 g/dL (ref 3.5–5.0)
Alkaline Phosphatase: 63 U/L (ref 40–150)
Anion Gap: 9 mEq/L (ref 3–11)
BUN: 12.4 mg/dL (ref 7.0–26.0)
CO2: 23 mEq/L (ref 22–29)
Calcium: 9 mg/dL (ref 8.4–10.4)
Chloride: 108 mEq/L (ref 98–109)
Creatinine: 0.8 mg/dL (ref 0.6–1.1)
EGFR: >60 mL/min/{1.73_m2} (ref >60)
Glucose: 95 mg/dl (ref 70–140)
Potassium: 4.1 mEq/L (ref 3.5–5.1)
Sodium: 141 mEq/L (ref 136–145)
Total Bilirubin: <0.22 mg/dL (ref 0.20–1.20)
Total Protein: 6.1 g/dL — ABNORMAL LOW (ref 6.4–8.3)

## 2017-08-18 LAB — CBC WITH DIFFERENTIAL/PLATELET
BASO%: 0.8 % (ref 0.0–2.0)
Basophils Absolute: 0 10*3/uL (ref 0.0–0.1)
EOS%: 2.5 % (ref 0.0–7.0)
Eosinophils Absolute: 0.1 10*3/uL (ref 0.0–0.5)
HCT: 35.4 % (ref 34.8–46.6)
HGB: 11.8 g/dL (ref 11.6–15.9)
LYMPH%: 24.5 % (ref 14.0–49.7)
MCH: 30.4 pg (ref 25.1–34.0)
MCHC: 33.3 g/dL (ref 31.5–36.0)
MCV: 91.2 fL (ref 79.5–101.0)
MONO#: 0.4 10*3/uL (ref 0.1–0.9)
MONO%: 10.7 % (ref 0.0–14.0)
NEUT#: 2.2 10*3/uL (ref 1.5–6.5)
NEUT%: 61.5 % (ref 38.4–76.8)
Platelets: 201 10*3/uL (ref 145–400)
RBC: 3.88 10*6/uL (ref 3.70–5.45)
RDW: 16.3 % — ABNORMAL HIGH (ref 11.2–14.5)
WBC: 3.6 10*3/uL — ABNORMAL LOW (ref 3.9–10.3)
lymph#: 0.9 10*3/uL (ref 0.9–3.3)

## 2017-08-18 MED ORDER — SODIUM CHLORIDE 0.9% FLUSH
10.0000 mL | Freq: Once | INTRAVENOUS | Status: AC
  Administered 2017-08-18: 10 mL
  Filled 2017-08-18: qty 10

## 2017-08-18 MED ORDER — HEPARIN SOD (PORK) LOCK FLUSH 100 UNIT/ML IV SOLN
500.0000 [IU] | Freq: Once | INTRAVENOUS | Status: AC | PRN
Start: 2017-08-18 — End: 2017-08-18
  Administered 2017-08-18: 500 [IU]
  Filled 2017-08-18: qty 5

## 2017-08-18 MED ORDER — SODIUM CHLORIDE 0.9% FLUSH
10.0000 mL | INTRAVENOUS | Status: DC | PRN
  Administered 2017-08-18: 10 mL
  Filled 2017-08-18: qty 10

## 2017-08-18 MED ORDER — SODIUM CHLORIDE 0.9 % IV SOLN
Freq: Once | INTRAVENOUS | Status: AC
  Administered 2017-08-18: 11:00:00 via INTRAVENOUS

## 2017-08-18 MED ORDER — PROCHLORPERAZINE MALEATE 10 MG PO TABS: 10.0000 mg | ORAL_TABLET | Freq: Four times a day (QID) | ORAL | Status: DC | PRN

## 2017-08-18 MED ORDER — PACLITAXEL PROTEIN-BOUND CHEMO INJECTION 100 MG
80.0000 mg/m2 | Freq: Once | INTRAVENOUS | Status: AC
  Administered 2017-08-18: 125 mg via INTRAVENOUS
  Filled 2017-08-18: qty 25

## 2017-08-18 NOTE — Patient Instructions (Signed)
[facility] Discharge Instructions for Patients Receiving Chemotherapy   Today you received the following chemotherapy agents Abraxane.   To help prevent nausea and vomiting after your treatment, we encourage you to take your nausea medicationas prescribed.  If you develop nausea and vomiting that is not controlled by your nausea medication, call the clinic.   BELOW ARE SYMPTOMS THAT SHOULD BE REPORTED IMMEDIATELY:  *FEVER GREATER THAN 100.5 F  *CHILLS WITH OR WITHOUT FEVER  NAUSEA AND VOMITING THAT IS NOT CONTROLLED WITH YOUR NAUSEA MEDICATION  *UNUSUAL SHORTNESS OF BREATH  *UNUSUAL BRUISING OR BLEEDING  TENDERNESS IN MOUTH AND THROAT WITH OR WITHOUT PRESENCE OF ULCERS  *URINARY PROBLEMS  *BOWEL PROBLEMS  UNUSUAL RASH Items with * indicate a potential emergency and should be followed up as soon as possible.  Feel free to call the clinic should you have any questions or concerns. The clinic phone number is [phone].  Please show the CHEMO ALERT CARD at check-in to the Emergency Department and triage nurse.  

## 2017-08-22 ENCOUNTER — Encounter: Payer: Self-pay | Admitting: Physical Therapy

## 2017-08-22 ENCOUNTER — Ambulatory Visit: Payer: BLUE CROSS/BLUE SHIELD | Admitting: Physical Therapy

## 2017-08-22 ENCOUNTER — Other Ambulatory Visit: Payer: Self-pay

## 2017-08-22 DIAGNOSIS — M25612 Stiffness of left shoulder, not elsewhere classified: Secondary | ICD-10-CM

## 2017-08-22 DIAGNOSIS — M25512 Pain in left shoulder: Secondary | ICD-10-CM | POA: Diagnosis not present

## 2017-08-22 DIAGNOSIS — Z483 Aftercare following surgery for neoplasm: Secondary | ICD-10-CM

## 2017-08-22 DIAGNOSIS — M25611 Stiffness of right shoulder, not elsewhere classified: Secondary | ICD-10-CM | POA: Diagnosis not present

## 2017-08-22 DIAGNOSIS — R293 Abnormal posture: Secondary | ICD-10-CM

## 2017-08-22 DIAGNOSIS — M25511 Pain in right shoulder: Secondary | ICD-10-CM | POA: Diagnosis not present

## 2017-08-22 NOTE — Therapy (Signed)
[place], [place], [address] Phone: [phone]   Fax:  [phone]  Physical Therapy Treatment  Patient Details  Name: Madison Savage MRN: [id number] Date of Birth: [birth year] Referring Provider: Dr. Burr Medico    Encounter Date: 08/22/2017  PT End of Session - 08/22/17 1225    Visit Number  16    Number of Visits  25    Date for PT Re-Evaluation  09/04/17    PT Start Time  1696    PT Stop Time  1100    PT Time Calculation (min)  45 min    Activity Tolerance  Patient tolerated treatment well    Behavior During Therapy  [facility] for tasks assessed/performed       Past Medical History:  Diagnosis Date  . Anemia   . Anxiety   . Cancer ([place]) 03/2017   left breast cancer  . Chronic ITP (idiopathic thrombocytopenia) (HCC)   . Family history of breast cancer   . Family history of ovarian cancer   . Headache    last migraine...9/24  . Heart murmur    at birth.  none now  . MTHFR mutation [facility])     Past Surgical History:  Procedure Laterality Date  . AXILLARY LYMPH NODE DISSECTION Left 04/27/2017   Procedure: AXILLARY LYMPH NODE DISSECTION;  Surgeon: [name], MD;  Location: [place];  Service: General;  Laterality: Left;  . BILATERAL TOTAL MASTECTOMY WITH AXILLARY LYMPH NODE DISSECTION Bilateral 04/27/2017   Procedure: SKIN SPARING MASTECTOMY;  Surgeon: [name], MD;  Location: [place];  Service: General;  Laterality: Bilateral;  . BREAST RECONSTRUCTION WITH PLACEMENT OF TISSUE EXPANDER AND FLEX HD (ACELLULAR HYDRATED DERMIS) Bilateral 04/27/2017   Procedure: BILATERAL BREAST RECONSTRUCTION WITH PLACEMENT OF TISSUE EXPANDER AND FLEX HD (ACELLULAR HYDRATED DERMIS);  Surgeon: Wallace Going, DO;  Location: [place];  Service: Plastics;  Laterality: Bilateral;  . BREAST SURGERY    . MASTECTOMY    . PORTACATH PLACEMENT Right 06/01/2017   Procedure: INSERTION PORT-A-CATH RIGHT SUBCLAVIAN;  Surgeon: [name], MD;  Location: [place];  Service: General;  Laterality: Right;  . WISDOM TOOTH EXTRACTION      There were no vitals filed for this visit.  Subjective Assessment - 08/22/17 1218    Subjective  Pt is doing better, she still has a cough.  she did not tolerate her previous chemo weel, but it has been changed and she is doing better with that.  She still has some intermittent pain in posterior left shoulder.    Pertinent History  Patient was diagnosed on 03/21/17 with left invasive lobular carcinoma breast cancer. It measures 2.1 cm and is located in the upper outer quadrant. She also has 6 abnormal axillary lymph nodes and a node was biopsied and found to be positive for carcinoma.   04/27/2017 .bilateral mastecomy with ALND ( 21 nodes removed) with immedicate expanders. She is planning to have chemo, then have implant surgery and hysterectomy and then have radiation     Patient Stated Goals  to get motion in arm back, return to exercise, and learn what to do to prevent lymphedema     Currently in Pain?  No/denies                      [facility] Adult PT Treatment/Exercise - 08/22/17 0001      Lumbar Exercises: Quadruped   Other Quadruped Lumbar  Exercises  hand behind head for thoractic rotation and scapular prograction and retraction       Shoulder Exercises: Supine   Other Supine Exercises  small yellow soft ball placed under thoracic spine with 2 pillows under head for support, pt did large arm circles, protraction and retraction with arms up to ceiling, anterior chest stretch,       Shoulder Exercises: Sidelying   Other Sidelying Exercises  shoulder horizontal abduction with backward thoracici rotation       Manual Therapy   Soft tissue mobilization  in right sidelying, with biotone to tight tender areas in left lateral scapula and posterior shoulder areas     Myofascial Release  to cording in left axilla     Scapular Mobilization  in right sidelying to left scapula for pain relief.  Scapula very tight to rib cage but better able to move after soft tissue work                 [place] - 08/01/17 1611      CC Short Term Goal  #1   Title  pt will be knowledgable of lymphedema risk reduction practices     Status  Achieved      CC Short Term Goal  #2   Title  Pt will be independent in postural and shoulder stretching exercises    Status  Achieved        Breast Clinic Goals - 03/29/17 1232      Patient will be able to verbalize understanding of pertinent lymphedema risk reduction practices relevant to her diagnosis specifically related to skin care.   Time  1    Period  Days    Status  Achieved      Patient will be able to return demonstrate and/or verbalize understanding of the post-op home exercise program related to regaining shoulder range of motion.   Time  1    Period  Days    Status  Achieved      Patient will be able to verbalize understanding of the importance of attending the postoperative After Breast Cancer Class for further lymphedema risk reduction education and therapeutic exercise.   Time  1    Period  Days    Status  Achieved       Long Term Clinic Goals - 08/01/17 1611      CC Long Term Goal  #1   Title  Pt will have 140 degrees of left shoulder abduction so that she can received radiation treatment     Baseline  05/25/2017: 60 degrees , 07/05/2017  140 degrees ,08/01/2017 155     Status  Achieved      CC Long Term Goal  #2   Title  Pt will ahve 150 degrees of right shoulder abduction so that she can perform household activities without difficulty     Baseline  05/25/2017: 95 degrees , 155 degrees on 07/05/2017, 167 on 08/01/2017     Status  Achieved      CC Long Term Goal  #3   Title  Pt will be independent in strength ABC program with knowledge of how to safely increase her intensity of exercise    Status  Achieved      CC Long Term  Goal  #4   Title  Pt will decrease Quick DASH score to < 20 indicating a function improvment of arms     Baseline  52.27 on 05/25/2017  CC Long Term Goal  #5   Title   will have a report a reduction in cording left axilla by 75%    Time  6    Period  Weeks    Status  New         Plan - 08/22/17 1225    Clinical Impression Statement  Pt appears to be doing better overall, with intermittent pain in left posterior shoulder and cording still present in left axilla that she says appears to be lessened at times.      Rehab Potential  Good    Clinical Impairments Affecting Rehab Potential  21 nodes removed in left axilla     PT Frequency  2x / week    PT Treatment/Interventions  Therapeutic exercise;Patient/family education;ADLs/Self Care Home Management;Taping;DME Instruction;Dry needling;Functional mobility training;Therapeutic activities;Manual lymph drainage;Manual techniques;Passive range of motion    PT Next Visit Plan  continue with ball under thoracic spine for mobility activities and manual work for myofascial release and cording reduction     Consulted and Agree with Plan of Care  Patient       Patient will benefit from skilled therapeutic intervention in order to improve the following deficits and impairments:     Visit Diagnosis: Stiffness of left shoulder, not elsewhere classified  Acute pain of left shoulder  Abnormal posture  Stiffness of right shoulder, not elsewhere classified  Aftercare following surgery for neoplasm     Problem List Patient Active Problem List   Diagnosis Date Noted  . Port-A-Cath in place 08/18/2017  . Breast cancer ([place]) 04/27/2017  . Genetic testing 04/17/2017  . BRCA2 gene mutation positive 04/17/2017  . Family history of breast cancer   . Family history of ovarian cancer   . Breast cancer metastasized to axillary lymph node, left ([place]) 03/29/2017  . Cough 03/10/2014  . Esophagitis 03/10/2014  . Chronic ITP (idiopathic  thrombocytopenia) (HCC) 03/10/2014  . ALLERGIC REACTION, ACUTE 07/22/2008   Donato Heinz. Owens Shark PT  Norwood Levo 08/22/2017, 12:28 PM  [place], [place], [address] Phone: [phone]   Fax:  [phone]  Name: KATHLYNE LOUD MRN: [id number] Date of Birth: Jan 30, 1971

## 2017-08-25 ENCOUNTER — Ambulatory Visit: Payer: BLUE CROSS/BLUE SHIELD

## 2017-08-25 ENCOUNTER — Other Ambulatory Visit: Payer: BLUE CROSS/BLUE SHIELD

## 2017-08-31 ENCOUNTER — Encounter: Payer: Self-pay | Admitting: Physical Therapy

## 2017-08-31 ENCOUNTER — Ambulatory Visit: Payer: BLUE CROSS/BLUE SHIELD | Admitting: Physical Therapy

## 2017-08-31 ENCOUNTER — Other Ambulatory Visit: Payer: Self-pay | Admitting: Hematology

## 2017-08-31 DIAGNOSIS — Z483 Aftercare following surgery for neoplasm: Secondary | ICD-10-CM

## 2017-08-31 DIAGNOSIS — R293 Abnormal posture: Secondary | ICD-10-CM

## 2017-08-31 DIAGNOSIS — M25611 Stiffness of right shoulder, not elsewhere classified: Secondary | ICD-10-CM

## 2017-08-31 DIAGNOSIS — M25511 Pain in right shoulder: Secondary | ICD-10-CM | POA: Diagnosis not present

## 2017-08-31 DIAGNOSIS — M25612 Stiffness of left shoulder, not elsewhere classified: Secondary | ICD-10-CM | POA: Diagnosis not present

## 2017-08-31 DIAGNOSIS — M25512 Pain in left shoulder: Secondary | ICD-10-CM

## 2017-08-31 NOTE — Therapy (Signed)
[place], [place], [address] Phone: [phone]   Fax:  [phone]  Physical Therapy Treatment  Patient Details  Name: [place] Savage MRN: [id number] Date of Birth: [birth year] Referring Provider: Dr. Burr Medico    Encounter Date: 08/31/2017  PT End of Session - 08/31/17 1238    Visit Number  17    Number of Visits  25    Date for PT Re-Evaluation  09/04/17    PT Start Time  4193    PT Stop Time  1100    PT Time Calculation (min)  45 min    Activity Tolerance  Patient tolerated treatment well    Behavior During Therapy  [facility] for tasks assessed/performed       Past Medical History:  Diagnosis Date  . Anemia   . Anxiety   . Cancer ([place]) 03/2017   left breast cancer  . Chronic ITP (idiopathic thrombocytopenia) (HCC)   . Family history of breast cancer   . Family history of ovarian cancer   . Headache    last migraine...9/24  . Heart murmur    at birth.  none now  . MTHFR mutation [facility])     Past Surgical History:  Procedure Laterality Date  . AXILLARY LYMPH NODE DISSECTION Left 04/27/2017   Procedure: AXILLARY LYMPH NODE DISSECTION;  Surgeon: [name], MD;  Location: [place];  Service: General;  Laterality: Left;  . BILATERAL TOTAL MASTECTOMY WITH AXILLARY LYMPH NODE DISSECTION Bilateral 04/27/2017   Procedure: SKIN SPARING MASTECTOMY;  Surgeon: [name], MD;  Location: [place];  Service: General;  Laterality: Bilateral;  . BREAST RECONSTRUCTION WITH PLACEMENT OF TISSUE EXPANDER AND FLEX HD (ACELLULAR HYDRATED DERMIS) Bilateral 04/27/2017   Procedure: BILATERAL BREAST RECONSTRUCTION WITH PLACEMENT OF TISSUE EXPANDER AND FLEX HD (ACELLULAR HYDRATED DERMIS);  Surgeon: Wallace Going, DO;  Location: [place];  Service: Plastics;  Laterality: Bilateral;  . BREAST SURGERY    . MASTECTOMY    . PORTACATH PLACEMENT Right 06/01/2017   Procedure: INSERTION PORT-A-CATH RIGHT SUBCLAVIAN;  Surgeon: [name], MD;  Location: [place];  Service: General;  Laterality: Right;  . WISDOM TOOTH EXTRACTION      There were no vitals filed for this visit.  Subjective Assessment - 08/31/17 1038    Subjective  Pt states she is doing better, She did well with Christmas activites with only minor pain.  She has not had any problem with swelling.  She feels that she is ready to discharge from this episode. She has compression sleeve if needed     Pertinent History  Patient was diagnosed on 03/21/17 with left invasive lobular carcinoma breast cancer. It measures 2.1 cm and is located in the upper outer quadrant. She also has 6 abnormal axillary lymph nodes and a node was biopsied and found to be positive for carcinoma.   04/27/2017 .bilateral mastecomy with ALND ( 21 nodes removed) with immedicate expanders. She is planning to have chemo, then have implant surgery and hysterectomy and then have radiation     Patient Stated Goals  to get motion in arm back, return to exercise, and learn what to do to prevent lymphedema     Currently in Pain?  No/denies         [facility] PT Assessment - 08/31/17 0001      Observation/Other Assessments   Quick DASH   4.55      AROM   Right Shoulder  Flexion  165 Degrees in supine     Right Shoulder ABduction  180 Degrees in supine     Left Shoulder Flexion  163 Degrees in supine, cord evident in axilla     Left Shoulder ABduction  180 Degrees in supine            Quick Dash - 08/31/17 0001    Open a tight or new jar  Mild difficulty    Do heavy household chores (wash walls, wash floors)  No difficulty    Carry a shopping bag or briefcase  No difficulty    Wash your back  No difficulty    Use a knife to cut food  No difficulty    Recreational activities in which you take some force or impact through your arm, shoulder, or hand (golf, hammering, tennis)  Mild difficulty    During the past week, to what  extent has your arm, shoulder or hand problem interfered with your normal social activities with family, friends, neighbors, or groups?  Not at all    During the past week, to what extent has your arm, shoulder or hand problem limited your work or other regular daily activities  Not at all    Arm, shoulder, or hand pain.  None    Tingling (pins and needles) in your arm, shoulder, or hand  None    Difficulty Sleeping  No difficulty    DASH Score  4.55 %            [facility] Adult PT Treatment/Exercise - 08/31/17 0001      Exercises   Exercises  Other Exercises    Other Exercises   pt is able to do a plank and a side cruch without pain, but still has some difficulty with the more advanced abdomial exercise      Lumbar Exercises: Supine   Other Supine Lumbar Exercises  yellow ball under sacrum for pelvic tilts and also under thoracic spine for anterior chest stretch       Shoulder Exercises: Supine   Protraction  Strengthening;Right;Left;20 reps;Weights    Protraction Weight (lbs)  2    Other Supine Exercises  bicep curls 2 sets of 10 with 3 pounds       Shoulder Exercises: Sidelying   Other Sidelying Exercises  shoulder horizontal abduction with backward thoracicrotation       Manual Therapy   Myofascial Release  to cording in left axilla                Short Term Clinic Goals - 08/31/17 1249      CC Short Term Goal  #1   Title  pt will be knowledgable of lymphedema risk reduction practices     Status  Achieved      CC Short Term Goal  #2   Title  Pt will be independent in postural and shoulder stretching exercises    Status  Achieved        Breast Clinic Goals - 03/29/17 1232      Patient will be able to verbalize understanding of pertinent lymphedema risk reduction practices relevant to her diagnosis specifically related to skin care.   Time  1    Period  Days    Status  Achieved      Patient will be able to return demonstrate and/or verbalize understanding  of the post-op home exercise program related to regaining shoulder range of motion.   Time  1    Period  Days    Status  Achieved      Patient will be able to verbalize understanding of the importance of attending the postoperative After Breast Cancer Class for further lymphedema risk reduction education and therapeutic exercise.   Time  1    Period  Days    Status  Achieved       Long Term Clinic Goals - 08/31/17 1100      CC Long Term Goal  #1   Title  Pt will have 140 degrees of left shoulder abduction so that she can received radiation treatment     Baseline  05/25/2017: 60 degrees , 07/05/2017  140 degrees ,08/01/2017 155     Status  Achieved      CC Long Term Goal  #2   Title  Pt will ahve 150 degrees of right shoulder abduction so that she can perform household activities without difficulty     Baseline  05/25/2017: 95 degrees , 155 degrees on 07/05/2017, 167 on 08/01/2017 , 08/31/2017:  180 degrees    Status  Achieved      CC Long Term Goal  #3   Title  Pt will be independent in strength ABC program with knowledge of how to safely increase her intensity of exercise    Status  Achieved      CC Long Term Goal  #4   Title  Pt will decrease Quick DASH score to < 20 indicating a function improvment of arms     Baseline  52.27 on 05/25/2017 , 4.55 on 08/31/2017    Status  Achieved      CC Long Term Goal  #5   Title   will have a report a reduction in cording left axilla by 75%    Baseline  cording is still present but decreased by 50%    Status  Partially Met      CC Long Term Goal  #6   Title  Pt will be able to return to plank and side crunch exercise that she wants to do without pain     Status  Achieved         Plan - 08/31/17 1243    Clinical Impression Statement  Pt contines to improve.  She still has some visible and palpable cording in her left axilla but is able to do all functional activities and perform some of the exercises that she wants to do.  She is ready  to progress to lifting weights and advance her abdominal exercise. Her range of motion goals have been met.  She is ready to discharge from this episode. She knows that she can return if needed  at a later point in time with a new referral.     PT Next Visit Plan  Discharge     Consulted and Agree with Plan of Care  Patient       Patient will benefit from skilled therapeutic intervention in order to improve the following deficits and impairments:  Decreased range of motion, Impaired UE functional use, Pain, Decreased knowledge of precautions, Postural dysfunction, Decreased knowledge of use of DME, Decreased strength, Increased fascial restricitons, Impaired perceived functional ability  Visit Diagnosis: Stiffness of left shoulder, not elsewhere classified  Acute pain of left shoulder  Abnormal posture  Stiffness of right shoulder, not elsewhere classified  Aftercare following surgery for neoplasm  Pain of right shoulder joint on movement     Problem List Patient Active Problem List   Diagnosis Date Noted  .  Port-A-Cath in place 08/18/2017  . Breast cancer ([place]) 04/27/2017  . Genetic testing 04/17/2017  . BRCA2 gene mutation positive 04/17/2017  . Family history of breast cancer   . Family history of ovarian cancer   . Breast cancer metastasized to axillary lymph node, left ([place]) 03/29/2017  . Cough 03/10/2014  . Esophagitis 03/10/2014  . Chronic ITP (idiopathic thrombocytopenia) (HCC) 03/10/2014  . ALLERGIC REACTION, ACUTE 07/22/2008   PHYSICAL THERAPY DISCHARGE SUMMARY  Visits from Start of Care: 17  Current functional level related to goals / functional outcomes: As above    Remaining deficits: Cording in left axilla    Education / Equipment: Lymphedema risk reduction, exercise program  Plan: Patient agrees to discharge.  Patient goals were partially met. Patient is being discharged due to being pleased with the current functional level.  ?????     Donato Heinz.  Owens Shark PT  Norwood Levo 08/31/2017, 12:51 PM  [place], [place], [address] Phone: [phone]   Fax:  [phone]  Name: AMMI HUTT MRN: [id number] Date of Birth: 06-02-1971

## 2017-09-01 ENCOUNTER — Encounter: Payer: Self-pay | Admitting: Hematology

## 2017-09-01 ENCOUNTER — Other Ambulatory Visit: Payer: Self-pay | Admitting: *Deleted

## 2017-09-01 ENCOUNTER — Other Ambulatory Visit (HOSPITAL_BASED_OUTPATIENT_CLINIC_OR_DEPARTMENT_OTHER): Payer: BLUE CROSS/BLUE SHIELD

## 2017-09-01 ENCOUNTER — Encounter: Payer: Self-pay | Admitting: *Deleted

## 2017-09-01 ENCOUNTER — Telehealth: Payer: Self-pay | Admitting: Hematology

## 2017-09-01 ENCOUNTER — Ambulatory Visit (HOSPITAL_BASED_OUTPATIENT_CLINIC_OR_DEPARTMENT_OTHER): Payer: BLUE CROSS/BLUE SHIELD

## 2017-09-01 ENCOUNTER — Ambulatory Visit: Payer: BLUE CROSS/BLUE SHIELD

## 2017-09-01 ENCOUNTER — Ambulatory Visit (HOSPITAL_BASED_OUTPATIENT_CLINIC_OR_DEPARTMENT_OTHER): Payer: BLUE CROSS/BLUE SHIELD | Admitting: Hematology

## 2017-09-01 VITALS — BP 112/52 | HR 90 | Temp 98.2°F | Resp 18 | Ht 67.5 in | Wt 121.8 lb

## 2017-09-01 DIAGNOSIS — C773 Secondary and unspecified malignant neoplasm of axilla and upper limb lymph nodes: Secondary | ICD-10-CM

## 2017-09-01 DIAGNOSIS — D693 Immune thrombocytopenic purpura: Secondary | ICD-10-CM

## 2017-09-01 DIAGNOSIS — J069 Acute upper respiratory infection, unspecified: Secondary | ICD-10-CM | POA: Diagnosis not present

## 2017-09-01 DIAGNOSIS — C50912 Malignant neoplasm of unspecified site of left female breast: Secondary | ICD-10-CM

## 2017-09-01 DIAGNOSIS — Z95828 Presence of other vascular implants and grafts: Secondary | ICD-10-CM

## 2017-09-01 DIAGNOSIS — Z170 Estrogen receptor positive status [ER+]: Secondary | ICD-10-CM

## 2017-09-01 DIAGNOSIS — R05 Cough: Secondary | ICD-10-CM

## 2017-09-01 DIAGNOSIS — R0789 Other chest pain: Secondary | ICD-10-CM

## 2017-09-01 LAB — CBC WITH DIFFERENTIAL/PLATELET
BASO%: 0.7 % (ref 0.0–2.0)
Basophils Absolute: 0 10*3/uL (ref 0.0–0.1)
EOS%: 1.7 % (ref 0.0–7.0)
Eosinophils Absolute: 0.1 10*3/uL (ref 0.0–0.5)
HCT: 38.6 % (ref 34.8–46.6)
HGB: 13 g/dL (ref 11.6–15.9)
LYMPH%: 18.9 % (ref 14.0–49.7)
MCH: 31.2 pg (ref 25.1–34.0)
MCHC: 33.8 g/dL (ref 31.5–36.0)
MCV: 92.4 fL (ref 79.5–101.0)
MONO#: 0.6 10*3/uL (ref 0.1–0.9)
MONO%: 11.1 % (ref 0.0–14.0)
NEUT#: 3.6 10*3/uL (ref 1.5–6.5)
NEUT%: 67.6 % (ref 38.4–76.8)
Platelets: 160 10*3/uL (ref 145–400)
RBC: 4.18 10*6/uL (ref 3.70–5.45)
RDW: 16.9 % — ABNORMAL HIGH (ref 11.2–14.5)
WBC: 5.3 10*3/uL (ref 3.9–10.3)
lymph#: 1 10*3/uL (ref 0.9–3.3)

## 2017-09-01 LAB — COMPREHENSIVE METABOLIC PANEL
ALT: 21 U/L (ref 0–55)
AST: 15 U/L (ref 5–34)
Albumin: 4 g/dL (ref 3.5–5.0)
Alkaline Phosphatase: 72 U/L (ref 40–150)
Anion Gap: 7 mEq/L (ref 3–11)
BUN: 13.5 mg/dL (ref 7.0–26.0)
CO2: 26 mEq/L (ref 22–29)
Calcium: 9.4 mg/dL (ref 8.4–10.4)
Chloride: 107 mEq/L (ref 98–109)
Creatinine: 0.8 mg/dL (ref 0.6–1.1)
EGFR: >60 mL/min/{1.73_m2} (ref >60)
Glucose: 91 mg/dl (ref 70–140)
Potassium: 4.1 mEq/L (ref 3.5–5.1)
Sodium: 140 mEq/L (ref 136–145)
Total Bilirubin: <0.22 mg/dL (ref 0.20–1.20)
Total Protein: 6.5 g/dL (ref 6.4–8.3)

## 2017-09-01 MED ORDER — HEPARIN SOD (PORK) LOCK FLUSH 100 UNIT/ML IV SOLN
500.0000 [IU] | Freq: Once | INTRAVENOUS | Status: AC
  Administered 2017-09-01: 500 [IU] via INTRAVENOUS
  Filled 2017-09-01: qty 5

## 2017-09-01 MED ORDER — FILGRASTIM 480 MCG/0.8ML IJ SOSY: 480.0000 ug | PREFILLED_SYRINGE | Freq: Once | INTRAMUSCULAR | Status: DC

## 2017-09-01 MED ORDER — SODIUM CHLORIDE 0.9% FLUSH
10.0000 mL | Freq: Once | INTRAVENOUS | Status: AC
  Administered 2017-09-01: 10 mL
  Filled 2017-09-01: qty 10

## 2017-09-01 NOTE — Patient Instructions (Signed)
Implanted Port Home Guide An implanted port is a type of central line that is placed under the skin. Central lines are used to provide IV access when treatment or nutrition needs to be given through a person's veins. Implanted ports are used for long-term IV access. An implanted port may be placed because:  You need IV medicine that would be irritating to the small veins in your hands or arms.  You need long-term IV medicines, such as antibiotics.  You need IV nutrition for a long period.  You need frequent blood draws for lab tests.  You need dialysis.  Implanted ports are usually placed in the chest area, but they can also be placed in the upper arm, the abdomen, or the leg. An implanted port has two main parts:  Reservoir. The reservoir is round and will appear as a small, raised area under your skin. The reservoir is the part where a needle is inserted to give medicines or draw blood.  Catheter. The catheter is a thin, flexible tube that extends from the reservoir. The catheter is placed into a large vein. Medicine that is inserted into the reservoir goes into the catheter and then into the vein.  How will I care for my incision site? Do not get the incision site wet. Bathe or shower as directed by your health care provider. How is my port accessed? Special steps must be taken to access the port:  Before the port is accessed, a numbing cream can be placed on the skin. This helps numb the skin over the port site.  Your health care provider uses a sterile technique to access the port. ? Your health care provider must put on a mask and sterile gloves. ? The skin over your port is cleaned carefully with an antiseptic and allowed to dry. ? The port is gently pinched between sterile gloves, and a needle is inserted into the port.  Only "non-coring" port needles should be used to access the port. Once the port is accessed, a blood return should be checked. This helps ensure that the port  is in the vein and is not clogged.  If your port needs to remain accessed for a constant infusion, a clear (transparent) bandage will be placed over the needle site. The bandage and needle will need to be changed every week, or as directed by your health care provider.  Keep the bandage covering the needle clean and dry. Do not get it wet. Follow your health care provider's instructions on how to take a shower or bath while the port is accessed.  If your port does not need to stay accessed, no bandage is needed over the port.  What is flushing? Flushing helps keep the port from getting clogged. Follow your health care provider's instructions on how and when to flush the port. Ports are usually flushed with saline solution or a medicine called heparin. The need for flushing will depend on how the port is used.  If the port is used for intermittent medicines or blood draws, the port will need to be flushed: ? After medicines have been given. ? After blood has been drawn. ? As part of routine maintenance.  If a constant infusion is running, the port may not need to be flushed.  How long will my port stay implanted? The port can stay in for as long as your health care provider thinks it is needed. When it is time for the port to come out, surgery will be   done to remove it. The procedure is similar to the one performed when the port was put in. When should I seek immediate medical care? When you have an implanted port, you should seek immediate medical care if:  You notice a bad smell coming from the incision site.  You have swelling, redness, or drainage at the incision site.  You have more swelling or pain at the port site or the surrounding area.  You have a fever that is not controlled with medicine.  This information is not intended to replace advice given to you by your health care provider. Make sure you discuss any questions you have with your health care provider. Document  Released: 08/22/2005 Document Revised: 01/28/2016 Document Reviewed: 04/29/2013 Elsevier Interactive Patient Education  2017 [employer] Inc.  

## 2017-09-01 NOTE — Telephone Encounter (Signed)
Scheduled appt per 12/28 los - Patient to get updated scheduled - next scheduled appt .

## 2017-09-01 NOTE — Progress Notes (Signed)
[facility]  [phone] [phone]  Clinic Follow up Note   Patient Care Team: Dorena Cookey, MD as PCP - Madison Citizen, MD as Consulting Physician (General Surgery) [name], MD as Consulting Physician (Hematology) Kyung Rudd, MD as Consulting Physician (Radiation Oncology) Brien Few, MD as Consulting Physician (Obstetrics and Gynecology)   Date of Service:  09/01/2017  CHIEF COMPLAINTS:  Follow up for Breast cancer metastasized to axillary lymph node, left   Oncology History   Cancer Staging Breast cancer metastasized to axillary lymph node, left [facility]) Staging form: Breast, AJCC 8th Edition - Clinical stage from 03/22/2017: Stage Unknown (cTX, cN1, cM0, GX, ER: Positive, PR: Positive, HER2: Negative) - Signed by [name], MD on 03/29/2017 - Pathologic stage from 04/27/2017: Stage IB (pT1c, pN2a, cM0, G2, ER: Positive, PR: Positive, HER2: Negative) - Signed by [name], MD on 05/25/2017 \     Breast cancer metastasized to axillary lymph node, left ([place])   03/21/2017 Mammogram    Diagnostic mammogram bilateral 03/21/17 IMPRESSION: INCOMPLETE - ADDITIONAL IMAGING EVALUATION NEEDED 1. The 0.6 cm oval mass in the right breast at 11 o'clock middle depth is indeterminate. An ultrasound is recommended.  2. The 0.5cm round focal asymmetry in the left breast central to the nipple middle depth is indeterminate. An ultrasound is recommended.  3. Ultrasound of the palpable abnormalities in the axilla and far left lateral breast is recommended.  4. Multiple clusters of pleomorphic calcifications in the left upper outer breast anterior depth spanning a 2.5 cm area are highly suggestive of malignancy. A stereotactic biopsy is recommended.        03/21/2017 Imaging    US Breast bilateral 03/21/17 IMPRESSION: HIGHLY SUGGESTIVE OF MALIGNANCY 1. Six distinct abnormal left axillary nodes, 2 of which correspond to the palpable lumps felt by the  patient. Findings concerning for metastatic adenopathy. Ultrasound guided biopsy of one of these nodes is recommended.  2. Small 5 mm palpable, oval mass in the left breast 3:00 areolar margin is at an intermediate suspicion. Ultrasound guided biopsy is recommended.  3. Isoechoic 8 mm mass in the right breast 10:00 5 cm from the nipple is also an intermediate suspicion for malignancy, and ultrasound guided biopsy is recommended.  4. Stereotactic guided biopsy for the highly suspicious calcifications in the left breast also recommended as detailed in the mammography report.        03/22/2017 Initial Biopsy    Diagnosis 03/22/17 Lymph node, needle/core biopsy, left axillary node -METASTATIC CARCINOMA, SEE COMMENT Microscopic Comment      03/22/2017 Receptors her2    Lymph node biopsy ER 95% positive, strong staining, PR 5% positive, weak staining, HER-2 negative, with HER2/CEP17 ratio 1.23 and copy #3.95.      03/27/2017 Pathology Results    Diagnosis 03/27/17 1. Breast, left, needle core biopsy -FIBROADENOMATOID NODULE WITH CALCIFICATIONS 2.Breast, left, needle core biopsy -DUCTAL CARCINOMA IN SITU, HIGH GRADE -SEE COMMENT  3. Breast, right, needle, core biopsy -FIBROADENOMA -NO MALIGNANCY IDENTIFIED       03/29/2017 Initial Diagnosis    Breast cancer metastasized to axillary lymph node, left (Mad[facility] Savage)      04/07/2017 Imaging    CT CAP 04/07/17 IMPRESSION: 1. Several prominent left axillary lymph nodes which may correspond to biopsy proven axillary lymph node metastasis. 2. No thoracic adenopathy identified or evidence of distant metastatic disease.      04/07/2017 Imaging    Bone whole body scan 04/07/17 IMPRESSION: Negative for evidence of  osseous metastatic disease.      04/17/2017 Genetic Testing    BRCA2 c.[id number] (p.Glu260Serfs*15) pathogenic mutation identified on the 9 gene STAT panel.  The STAT Breast cancer panel offered by [facility] includes sequencing and rearrangement  analysis for the following 9 genes:  ATM, BRCA1, BRCA2, CDH1, CHEK2, PALB2, PTEN, STK11 and TP53.   The report date is April 17, 2017.  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  The Hereditary Gene Panel offered by [facility] includes sequencing and/or deletion duplication testing of the following 46 genes: APC, ATM, AXIN2, BARD1, BMPR1A, BRCA1, BRCA2, BRIP1, CDH1, CDKN2A (p14ARF), CDKN2A (p16INK4a), CHEK2, CTNNA1, DICER1, EPCAM (Deletion/duplication testing only), GREM1 (promoter region deletion/duplication testing only), KIT, MEN1, MLH1, MSH2, MSH3, MSH6, MUTYH, NBN, NF1, NHTL1, PALB2, PDGFRA, PMS2, POLD1, POLE, PTEN, RAD50, RAD51C, RAD51D, SDHB, SDHC, SDHD, SMAD4, SMARCA4. STK11, TP53, TSC1, TSC2, and VHL.  The following genes were evaluated for sequence changes only: SDHA and HOXB13 c.251G>A variant only.  The report date is April 29, 2017.       04/27/2017 Surgery    Surgey 04/27/17 BILATERAL SKIN SPARING MASTECTOMY and LEFT AXILLARY LYMPH NODE and BILATERAL BREAST RECONSTRUCTION WITH PLACEMENT OF TISSUE EXPANDER AND FLEX HD (ACELLULAR HYDRATED DERMIS) Bilateral BY Dr. Barry Dienes and Dr. Marla Roe       04/27/2017 Pathology Results     Diagnosis  04/27/17 1. Breast, simple mastectomy, Right - FIBROCYSTIC CHANGES WITH ADENOSIS. - USUAL DUCTAL HYPERPLASIA. - HEALING BIOPSY SITE. - THERE IS NO EVIDENCE OF MALIGNANCY. 2. Breast, simple mastectomy, Left - INVASIVE DUCTAL CARCINOMA, GRADE II/III, SPANNING 1.2 CM. - DUCTAL CARCINOMA IN SITU WITH CALCIFICATIONS, HIGH GRADE. - INVASIVE DUCTAL CARCINOMA IS FOCALLY PRESENT AT THE ANTERIOR MARGIN. - DUCTAL CARCINOMA IN SITU IS BROADLY 0.1 CM TO THE POSTERIOR MARGIN. - SEE ONCOLOGY TABLE BELOW. 3. Lymph nodes, regional resection, Left axillary - METASTATIC CARCINOMA IN 8 OF 21 LYMPH NODES (8/21). - SEE COMMENT      06/09/2017 -  Chemotherapy     Adriamycin and Cytoxan every 2 weeks, for 4 cycles 06/09/17-07/21/17, followed by weekly Abraxane and carboplatin for 12 weeks starting 08/11/17. Held Carbo with cycle 2 due to poor toleration.         HISTORY OF PRESENTING ILLNESS: 03/29/17 [name] 46 y.o. female is here because of newly diagnosed Breast cancer metastasized to axillary lymph node, left. She presents to Breast Clinic today with her husband. She felt the lump herself June 22nd. It was soft and "squishy" and it moved. First in the left breast and then four days latter in the left axilla. She feels they have gotten smaller.   In the past she was diagnosed with being ITP positive. Period has been irregular more since her last miscarriage in 2015. She has MTHFR gene. In pregnancy her plt counts have gone low. She has not seen homologist for this. All 3 children were vaginal births.  Today she reports that with the lumps she feels a dull ache that comes and goes. She has not noticed any nipple or skin change. She had not had a mammogram since she was 60. Her PCP was going to set her up with a mammogram last year. Dr. Ronita Hipps is her OB and Dr. Sherren Mocha is her current PCP. She feels no change in appetite or weight loss. She quit smoking 3 weeks ago. She works out Scientist, product/process development. She reports her last attempt at the MRI, the contrast made  her feel like she could not breathe, her head was pounding and she felt hot all over and she was shaking. The trouble breathing only lasted while she was in the MRI.  Husband shared concern with insurance and financial. And referrals to second opinions.    GYN HISTORY  Menarchal: 16 LMP: 03/05/2017 Contraceptive: none HRT: N/A G7P3: - 3 miscarriages, 1 abortion    CURRENT THERAPY: Adriamycin and Cytoxan every 2 weeks, for 4 cycles 06/09/17-07/21/17, followed by weekly Abraxane and carboplatin for 12 weeks starting 08/11/17. Held Carbo with cycle 2 due to poor toleration, after cycle 2 may change  Abraxane to Taxol as her insurance has denied Abraxane.   INTERVAL HISTORY:  CAYSIE MINNIFIELD is here for a follow up and cycle  Abraxane. She presents to the clinic today noting her cough is improving. She denies and fever. She received a letter from her insurance that has declined her Abraxane. So she has to change her to Taxol as it is not able to appeal again. She goes 10/02/16 to set her reconstruction surgery after treatment. She feels much better with no carbo in her treatment.    MEDICAL HISTORY:  Past Medical History:  Diagnosis Date  . Anemia   . Anxiety   . Cancer ([place]) 03/2017   left breast cancer  . Chronic ITP (idiopathic thrombocytopenia) ([facility])   . Family history of breast cancer   . Family history of ovarian cancer   . Headache    last migraine...9/24  . Heart murmur    at birth.  none now  . MTHFR mutation [facility])     SURGICAL HISTORY: Past Surgical History:  Procedure Laterality Date  . AXILLARY LYMPH NODE DISSECTION Left 04/27/2017   Procedure: AXILLARY LYMPH NODE DISSECTION;  Surgeon: [name], MD;  Location: [place];  Service: General;  Laterality: Left;  . BILATERAL TOTAL MASTECTOMY WITH AXILLARY LYMPH NODE DISSECTION Bilateral 04/27/2017   Procedure: SKIN SPARING MASTECTOMY;  Surgeon: [name], MD;  Location: [place];  Service: General;  Laterality: Bilateral;  . BREAST RECONSTRUCTION WITH PLACEMENT OF TISSUE EXPANDER AND FLEX HD (ACELLULAR HYDRATED DERMIS) Bilateral 04/27/2017   Procedure: BILATERAL BREAST RECONSTRUCTION WITH PLACEMENT OF TISSUE EXPANDER AND FLEX HD (ACELLULAR HYDRATED DERMIS);  Surgeon: Wallace Going, DO;  Location: [place];  Service: Plastics;  Laterality: Bilateral;  . BREAST SURGERY    . MASTECTOMY    . PORTACATH PLACEMENT Right 06/01/2017   Procedure: INSERTION PORT-A-CATH RIGHT SUBCLAVIAN;  Surgeon: [name], MD;  Location: [place];  Service: General;  Laterality:  Right;  . WISDOM TOOTH EXTRACTION      SOCIAL HISTORY: Social History   Socioeconomic History  . Marital status: Married    Spouse name: Not on file  . Number of children: 3  . Years of education: Not on file  . Highest education level: Not on file  Social Needs  . Financial resource strain: Not on file  . Food insecurity - worry: Not on file  . Food insecurity - inability: Not on file  . Transportation needs - medical: Not on file  . Transportation needs - non-medical: Not on file  Occupational History    Employer: UNEMPLOYED  Tobacco Use  . Smoking status: Former Smoker    Packs/day: 0.50    Years: 20.00    Pack years: 10.00    Types: Cigarettes    Last attempt to quit: 03/05/2017    Years since quitting: 0.4  .  Smokeless tobacco: Never Used  Substance and Sexual Activity  . Alcohol use: No  . Drug use: No  . Sexual activity: Yes    Partners: Male    Birth control/protection: None  Other Topics Concern  . Not on file  Social History Narrative   Stay at home mom   3 children ages 29, 55, 26    FAMILY HISTORY: Family History  Problem Relation Age of Onset  . Breast cancer Paternal Aunt 72  . Ovarian cancer Paternal Aunt 57  . Cancer Paternal Grandmother        breast cancer   . Cancer Maternal Aunt        lymphoma   . Cancer Paternal Grandfather        throat cancer   . Cancer Paternal Aunt        lung cancer  . Cirrhosis Father   . AAA (abdominal aortic aneurysm) Maternal Grandfather   . Leukemia Paternal Aunt     ALLERGIES:  is allergic to clindamycin; penicillins; and codeine.  MEDICATIONS:  Current Outpatient Medications  Medication Sig Dispense Refill  . azithromycin (ZITHROMAX Z-PAK) 250 MG tablet Take as directed 6 each 0  . chlorpheniramine-HYDROcodone (TUSSIONEX) 10-8 MG/5ML SUER Take 5 mLs by mouth at bedtime as needed for cough. 115 mL 0  . Cyanocobalamin (VITAMIN B-12 PO) Take 1 tablet by mouth daily.     . diazepam (VALIUM) 2 MG tablet  Take 1 tablet (2 mg total) by mouth every 6 (six) hours as needed for anxiety or muscle spasms. 30 tablet 0  . HYDROcodone-acetaminophen (NORCO/VICODIN) 5-325 MG tablet Take 1 tablet by mouth every 4 (four) hours as needed for moderate pain. 30 tablet 0  . Multiple Vitamins-Calcium (ONE-A-DAY WOMENS PO) Take 1 tablet by mouth daily.    Marland Kitchen OVER THE COUNTER MEDICATION Take 1 capsule by mouth daily. Biotin/MSM    . prochlorperazine (COMPAZINE) 10 MG tablet Take 1 tablet (10 mg total) by mouth every 6 (six) hours as needed for nausea or vomiting. 30 tablet 1  . traMADol (ULTRAM) 50 MG tablet Take 1 tablet (50 mg total) by mouth every 6 (six) hours as needed. 15 tablet 1  . vitamin C (ASCORBIC ACID) 500 MG tablet Take 2,000 mg by mouth daily.      Current Facility-Administered Medications  Medication Dose Route Frequency Provider Last Rate Last Dose  . LORazepam (ATIVAN) injection 0.5 mg  0.5 mg Intravenous Once [name], MD       Facility-Administered Medications Ordered in Other Visits  Medication Dose Route Frequency Provider Last Rate Last Dose  . sodium chloride flush (NS) 0.9 % injection 10 mL  10 mL Intravenous PRN [name], MD   10 mL at 07/21/17 1323    REVIEW OF SYSTEMS:   Constitutional: Denies fevers, chills or abnormal night sweats  Eyes: Denies blurriness of vision, double vision or watery eyes Ears, nose, mouth, throat, and face: Denies mucositis or sore throat  Respiratory: Denies dyspnea or wheezes (+) dry cough much improved Cardiovascular: Denies palpitation, chest discomfort or lower extremity swelling Gastrointestinal:  Denies nausea, heartburn or change in bowel habits Skin: Denies abnormal skin rashes (+) palpable boil in right axilla  Lymphatics: Denies new lymphadenopathy or easy bruising Breast: (+) pinching in chest post surgery Neurological:Denies numbness, tingling or new weaknesses Behavioral/Psych: Mood is stable, no new changes  All other systems were reviewed  with the patient and are negative.   PHYSICAL EXAMINATION: ECOG PERFORMANCE STATUS: 0 -  Asymptomatic  Vitals:   09/01/17 1158  BP: (!) 112/52  Pulse: 90  Resp: 18  Temp: 98.2 F (36.8 C)  SpO2: 100%   Filed Weights   09/01/17 1158  Weight: 121 lb 12.8 oz (55.2 kg)     GENERAL:alert, no distress and comfortable SKIN: skin color, texture, turgor are normal, no rashes or significant lesions (+) a very small boil in right axilla  EYES: normal, conjunctiva are pink and non-injected, sclera clear OROPHARYNX:no exudate, no erythema and lips, buccal mucosa, and tongue normal  NECK: supple, thyroid normal size, non-tender, without nodularity LYMPH:  no palpable lymphadenopathy in the cervical, axillary or inguinal LUNGS: clear to auscultation and percussion with normal breathing effort HEART: regular rate & rhythm and no murmurs and no lower extremity edema ABDOMEN:abdomen soft, non-tender and normal bowel sounds Musculoskeletal:no cyanosis of digits and no clubbing  PSYCH: alert & oriented x 3 with fluent speech NEURO: no focal motor/sensory deficits Breast: Status post bilateral mastectomy and tissue expander placements, surgical incisions have healed well, no discharge or skin erythema.   LABORATORY DATA:  I have reviewed the data as listed CBC Latest Ref Rng & Units 09/01/2017 08/18/2017 08/11/2017  WBC 3.9 - 10.3 10e3/uL 5.3 3.6(L) 4.4  Hemoglobin 11.6 - 15.9 g/dL 13.0 11.8 12.7  Hematocrit 34.8 - 46.6 % 38.6 35.4 37.2  Platelets 145 - 400 10e3/uL 160 201 266    CMP Latest Ref Rng & Units 09/01/2017 08/18/2017 08/11/2017  Glucose 70 - 140 mg/dl 91 95 97  BUN 7.0 - 26.0 mg/dL 13.5 12.4 13.1  Creatinine 0.6 - 1.1 mg/dL 0.8 0.8 0.8  Sodium 136 - 145 mEq/L 140 141 140  Potassium 3.5 - 5.1 mEq/L 4.1 4.1 4.1  Chloride 101 - 111 mmol/L - - -  CO2 22 - 29 mEq/L _0 Calcium 8.4 - 10.4 mg/dL 9.4 9.0 9.0  Total Protein 6.4 - 8.3 g/dL 6.5 6.1(L) 6.4  Total Bilirubin 0.20 -  1.20 mg/dL <0.22 <0.22 0.23  Alkaline Phos 40 - 150 U/L 72 63 76  AST 5 - 34 U/L _1 ALT 0 - 55 U/L _2 PATHOLOGY   Diagnosis  04/27/17 1. Breast, simple mastectomy, Right - FIBROCYSTIC CHANGES WITH ADENOSIS. - USUAL DUCTAL HYPERPLASIA. - HEALING BIOPSY SITE. - THERE IS NO EVIDENCE OF MALIGNANCY. 2. Breast, simple mastectomy, Left - INVASIVE DUCTAL CARCINOMA, GRADE II/III, SPANNING 1.2 CM. - DUCTAL CARCINOMA IN SITU WITH CALCIFICATIONS, HIGH GRADE. - INVASIVE DUCTAL CARCINOMA IS FOCALLY PRESENT AT THE ANTERIOR MARGIN. - DUCTAL CARCINOMA IN SITU IS BROADLY 0.1 CM TO THE POSTERIOR MARGIN. - SEE ONCOLOGY TABLE BELOW. 3. Lymph nodes, regional resection, Left axillary - METASTATIC CARCINOMA IN 8 OF 21 LYMPH NODES (8/21). - SEE COMMENT. Microscopic Comment 2. BREAST, INVASIVE TUMOR Procedure: Simple mastectomy and axillary lymph node resections. Laterality: Left. Tumor Size: 1.2 cm (glass slide measurement). Histologic Type: Ductal Grade: II Tubular Differentiation: 3 Nuclear Pleomorphism: 3 Mitotic Count: 1 Ductal Carcinoma in Situ (DCIS): Present, high grade, extensive. Extent of Tumor: Confined to breast parenchyma. Margins: Invasive carcinoma, distance from closest margin: Focally present at the anterior margin. DCIS, distance from closest margin: Broadly less than 0.1 cm to the posterior margin. Regional Lymph Nodes: Number of Lymph Nodes Examined: 21 1 of 4 FINAL for QUANTAVIA, FRITH [phone]) Microscopic Comment(continued) Lymph Nodes with Macrometastases: 6 Lymph Nodes with Micrometastases: 0 Lymph Nodes with Isolated Tumor Cells: 2 Breast Prognostic Profile: [id number] Estrogen  Receptor: 95%, strong. Progesterone Receptor: 5%, strong. Her2: No amplification was detected. The ratio is 1.23. Ki-67: 15%. Best tumor block for sendout testing: 2G Pathologic Stage Classification (pTNM, AJCC 8th Edition): Primary Tumor (pT): pT1c Regional Lymph  Nodes (pN): pN2a Distant Metastases (pM): pMX Comments: Grossly, there is a 2.5 cm focus of indurated tissue identified. Histologic examination of this area reveals predominately high grade ductal carcinoma in situ with calcifications. There is associated invasive ductal carcinoma, which is E-cadherin positive, spanning 1.2 cm. (JBK:gt, 05/02/17) 3. Immunohistochemical stains for cytokeratin AE1/AE3 performed on multiple blocks on part 3 help highlight the presence of metastatic carcinoma. (JBK:gt, 05/02/17)   Diagnosis 03/27/17 1. Breast, left, needle core biopsy -FIBROADENOMATOID NODULE WITH CALCIFICATIONS 2.Breast, left, needle core biopsy -DUCTAL CARCINOMA IN SITU, HIGH GRADE -SEE COMMENT  3. Breast, right, needle, core biopsy -FIBROADENOMA -NO MALIGNANCY IDENTIFIED  Diagnosis 03/22/17 Lymph node, needle/core biopsy, left axillary node -METASTATIC CARCINOMA, SEE COMMENT Microscopic Comment Sections showed lymph node tissue mostly replaced by metastatic carcinoma characterized by sheets of epithelioid appearing cells displaying milder to moderate nuclear pleomorphism, fine chromatin, small nucleoli, and moderately abundant amphophilic to eosinophilic cyto-plasma. Immunohistochemistry staining showed positive for cytokeratin AE1/AE3, GATA-3 and estrogen receptor, with patchy positivity for cytokeratin 7 and GCDFP., Only scattered cells showed progesterone positivity. Negative for S100, melan-a, E-cadherin, cytokeratin 20, CDX-2, TTF-1 or WT-1. The findings are consistent with metastatic carcinoma of breast primary. Lack of significant e-cadherin positivity suggested lobular carcinoma.   GENETICS 04/10/17    PROCEDURES   ECHO 05/31/17 Study Conclusions - Left ventricle: The cavity size was normal. Wall thickness was   normal. Systolic function was normal. The estimated ejection   fraction was in the range of 55% to 60%. Wall motion was normal;   there were no regional wall motion  abnormalities. Impressions: - Normal LV systolic and diastolic function; global longitudinal   strain - 17.9%.  RADIOGRAPHIC STUDIES: I have personally reviewed the radiological images as listed and agreed with the findings in the report.  CT CAP 04/07/17 IMPRESSION: 1. Several prominent left axillary lymph nodes which may correspond to biopsy proven axillary lymph node metastasis. 2. No thoracic adenopathy identified or evidence of distant metastatic disease.   Bone whole body scan 04/07/17 IMPRESSION: Negative for evidence of osseous metastatic disease.  Diagnostic mammogram bilateral 03/21/17 IMPRESSION: INCOMPLETE - ADDITIONAL IMAGING EVALUATION NEEDED 1. The 0.6 cm oval mass in the right breast at 11 o'clock middle depth is indeterminate. An ultrasound is recommended.  2. The 0.5cm round focal asymmetry in the left breast central to the nipple middle depth is indeterminate. An ultrasound is recommended.  3. Ultrasound of the palpable abnormalities in the axilla and far left lateral breast is recommended.  4. Multiple clusters of pleomorphic calcifications in the left upper outer breast anterior depth spanning a 2.5 cm area are highly suggestive of malignancy. A stereotactic biopsy is recommended.   US Breast bilateral 03/21/17 IMPRESSION: HIGHLY SUGGESTIVE OF MALIGNANCY 1. Six distinct abnormal left axillary nodes, 2 of which correspond to the palpable lumps felt by the patient. Findings concerning for metastatic adenopathy. Ultrasound guided biopsy of one of these nodes is recommended.  2. Small 5 mm palpable, oval mass in the left breast 3:00 areolar margin is at an intermediate suspicion. Ultrasound guided biopsy is recommended.  3. Isoechoic 8 mm mass in the right breast 10:00 5 cm from the nipple is also an intermediate suspicion for malignancy, and ultrasound guided biopsy is recommended.  4. Stereotactic  guided biopsy for the highly suspicious calcifications in the left breast  also recommended as detailed in the mammography report.    ASSESSMENT & PLAN:  Madison CUPPLES is a 46 y.o. perimenopausal female with a history of Chronic ITP and MTHFR mutation, presented with a palpable left axillary adenopathy.  1. 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 She had a 1.2 cm left breast invasive ductal carcinoma, with 8 positive lymph nodes. Surgical margins were negative. -her staging CT and bone scan was negative -We discussed the risk of cancer recurrence after surgery. Due to her multiple positive lymph nodes, young age, she has high risk for recurrence.  -I recommended adjuvant chemotherapy to reduce her risk of recurrence. I recommend standard dose dense Adriamycin and Cytoxan every 2 weeks, for 4 cycles, followed by weekly Taxol or Abraxane for 12 weeks. Due to her BRCA2 mutation, and locally advanced disease, she probably would benefit from carboplatin also, I plan to add [place] to weekly Taxol. -Her Baseline 9/26/148 ECHO was normal -She started Adjuvant AC on 06/09/17, tolerating well so far, some fatigue, improves with IVF on week 2. Pt has been using Cold cap and it has been working for her thus far.  -She agreed to weekly Abraxane and Carboplatin and will continue Cold Cap. Due to worsened cold symptoms she started on 08/11/17.  -She did not tolerate the first cycle Carbo and Abraxane well. Per her request, I held [place] with cycle 2 and she tolerated abraxane alone very well -Her insurance has denied her Abraxane even after appeal. I discussed our pharm tech is working on getting her drug replacement of Abraxane from [employer]. In the meantime we would change her to Taxol.   However she is very concerned about potential infusion reaction from Taxol.  She would like to think about this before proceeding with Taxol. Will hold treatment today per her request -F/u in 2 weeks   2. Chronic ITP  -Discovered in her Last pregnancy, with platelet count dropped to between 50,000 and 60,000, recovered, her platelet counts is around 120-150K lately  -She does not follow up with hematologist  -I'm concerned her platelet counts will drop significantly with chemotherapy, she may need steroids or additional treatment for her ITP. -Her platelets have overall remained stable while on chemotherapy.   3. 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 -She has had bilateral mastectomy, due to the high-risk of breast cancer -She planned to have hysterectomy and BSO after she completes chemo, radiation and breast reconstruction -Patient has met our genetic counselor Mrs. Florene Glen, BRCA2 G mutation screening in her children and siblings were recommended.  4. Chest pain, secondary to [facility] -Managed with Norco as needed, much better now  -Manageable, continue monitoring   5. Cold symptoms, URI  afebrile, If she develops a fever I encouraged her to contact us -I suggest OTC medication to help resolve symptoms.  -I suggest flu vaccination after  symptoms resolve.  -Symptoms worsened over Thanksgiving  -on 08/03/17 she was prescribed tussionex cough suppressant PRN at night. This may be viral but given slight improvement then worsening of symptoms, she was prescribed azithromycin pack for bacterial pathogens. -Cough much  improved, still has mild residual symptoms  -Cough nearly resolved.    PLAN:  -Per pt request, no treatment today  -Lab, flush, taxol in 1 and 2 weeks.  If Abraxane drug replacement is successful, will continue Abraxane.  If not we will switch to Taxol. -f/u in 2 weeks    No orders of the defined types were placed in this encounter.   All questions were answered. The patient knows to call the clinic with any problems, questions or concerns. I spent 20 minutes counseling the patient face to face. The total time spent in the appointment was 30 minutes and more than 50% was on counseling.     [name], MD 09/01/2017    This document serves as a record of services personally performed by [name], MD. It was created on her behalf by Joslyn Devon, a trained medical scribe. The creation of this record is based on the scribe's personal observations and the provider's statements to them.    I have reviewed the above documentation for accuracy and completeness, and I agree with the above.

## 2017-09-07 ENCOUNTER — Other Ambulatory Visit: Payer: Self-pay | Admitting: Hematology

## 2017-09-07 ENCOUNTER — Telehealth: Payer: Self-pay | Admitting: Hematology

## 2017-09-07 NOTE — Telephone Encounter (Signed)
I spoke with pharmacy Rob regarding the drug replacement for her Abraxane.  Abraxane was denied by her insurance.  Patient is reluctant to switch to Taxol, [facility] is working on drug replacement. I called pt and left a message and inform that she will received Abraxane tomorrow, and she needs to sign the application tomorrow. I also left a message to see if she is willing to re-try carboplatin. If she dose, I will talk to her tomorrow in infusion.  [name]  09/07/2017

## 2017-09-08 ENCOUNTER — Ambulatory Visit (HOSPITAL_BASED_OUTPATIENT_CLINIC_OR_DEPARTMENT_OTHER): Payer: BLUE CROSS/BLUE SHIELD

## 2017-09-08 ENCOUNTER — Other Ambulatory Visit (HOSPITAL_BASED_OUTPATIENT_CLINIC_OR_DEPARTMENT_OTHER): Payer: BLUE CROSS/BLUE SHIELD

## 2017-09-08 VITALS — BP 91/66 | HR 86 | Temp 98.4°F | Resp 16 | Wt 122.0 lb

## 2017-09-08 DIAGNOSIS — Z5111 Encounter for antineoplastic chemotherapy: Secondary | ICD-10-CM

## 2017-09-08 DIAGNOSIS — C773 Secondary and unspecified malignant neoplasm of axilla and upper limb lymph nodes: Secondary | ICD-10-CM

## 2017-09-08 DIAGNOSIS — C50912 Malignant neoplasm of unspecified site of left female breast: Secondary | ICD-10-CM

## 2017-09-08 LAB — CBC WITH DIFFERENTIAL/PLATELET
BASO%: 0.6 % (ref 0.0–2.0)
Basophils Absolute: 0 10*3/uL (ref 0.0–0.1)
EOS%: 2.5 % (ref 0.0–7.0)
Eosinophils Absolute: 0.1 10*3/uL (ref 0.0–0.5)
HCT: 38.8 % (ref 34.8–46.6)
HGB: 13 g/dL (ref 11.6–15.9)
LYMPH%: 18.2 % (ref 14.0–49.7)
MCH: 31 pg (ref 25.1–34.0)
MCHC: 33.5 g/dL (ref 31.5–36.0)
MCV: 92.4 fL (ref 79.5–101.0)
MONO#: 0.5 10*3/uL (ref 0.1–0.9)
MONO%: 8.9 % (ref 0.0–14.0)
NEUT#: 3.5 10*3/uL (ref 1.5–6.5)
NEUT%: 69.8 % (ref 38.4–76.8)
Platelets: 135 10*3/uL — ABNORMAL LOW (ref 145–400)
RBC: 4.19 10*6/uL (ref 3.70–5.45)
RDW: 16.4 % — ABNORMAL HIGH (ref 11.2–14.5)
WBC: 5.1 10*3/uL (ref 3.9–10.3)
lymph#: 0.9 10*3/uL (ref 0.9–3.3)

## 2017-09-08 LAB — COMPREHENSIVE METABOLIC PANEL
ALT: 14 U/L (ref 0–55)
AST: 14 U/L (ref 5–34)
Albumin: 3.9 g/dL (ref 3.5–5.0)
Alkaline Phosphatase: 75 U/L (ref 40–150)
Anion Gap: 6 mEq/L (ref 3–11)
BUN: 13.6 mg/dL (ref 7.0–26.0)
CO2: 26 mEq/L (ref 22–29)
Calcium: 9.1 mg/dL (ref 8.4–10.4)
Chloride: 106 mEq/L (ref 98–109)
Creatinine: 0.8 mg/dL (ref 0.6–1.1)
EGFR: >60 mL/min/{1.73_m2} (ref >60)
Glucose: 99 mg/dl (ref 70–140)
Potassium: 4 mEq/L (ref 3.5–5.1)
Sodium: 139 mEq/L (ref 136–145)
Total Bilirubin: 0.3 mg/dL (ref 0.20–1.20)
Total Protein: 6.3 g/dL — ABNORMAL LOW (ref 6.4–8.3)

## 2017-09-08 MED ORDER — SODIUM CHLORIDE 0.9 % IV SOLN
Freq: Once | INTRAVENOUS | Status: AC
  Administered 2017-09-08: 11:00:00 via INTRAVENOUS

## 2017-09-08 MED ORDER — HEPARIN SOD (PORK) LOCK FLUSH 100 UNIT/ML IV SOLN
500.0000 [IU] | Freq: Once | INTRAVENOUS | Status: AC | PRN
  Administered 2017-09-08: 500 [IU]
  Filled 2017-09-08: qty 5

## 2017-09-08 MED ORDER — PROCHLORPERAZINE MALEATE 10 MG PO TABS: 10.0000 mg | ORAL_TABLET | Freq: Four times a day (QID) | ORAL | Status: DC | PRN

## 2017-09-08 MED ORDER — SODIUM CHLORIDE 0.9% FLUSH
10.0000 mL | INTRAVENOUS | Status: DC | PRN
  Administered 2017-09-08: 10 mL
  Filled 2017-09-08: qty 10

## 2017-09-08 MED ORDER — PACLITAXEL PROTEIN-BOUND CHEMO INJECTION 100 MG
100.0000 mg/m2 | Freq: Once | INTRAVENOUS | Status: AC
  Administered 2017-09-08: 150 mg via INTRAVENOUS
  Filled 2017-09-08: qty 30

## 2017-09-08 NOTE — Patient Instructions (Signed)
[facility] Discharge Instructions for Patients Receiving Chemotherapy   Today you received the following chemotherapy agents Abraxane.   To help prevent nausea and vomiting after your treatment, we encourage you to take your nausea medicationas prescribed.  If you develop nausea and vomiting that is not controlled by your nausea medication, call the clinic.   BELOW ARE SYMPTOMS THAT SHOULD BE REPORTED IMMEDIATELY:  *FEVER GREATER THAN 100.5 F  *CHILLS WITH OR WITHOUT FEVER  NAUSEA AND VOMITING THAT IS NOT CONTROLLED WITH YOUR NAUSEA MEDICATION  *UNUSUAL SHORTNESS OF BREATH  *UNUSUAL BRUISING OR BLEEDING  TENDERNESS IN MOUTH AND THROAT WITH OR WITHOUT PRESENCE OF ULCERS  *URINARY PROBLEMS  *BOWEL PROBLEMS  UNUSUAL RASH Items with * indicate a potential emergency and should be followed up as soon as possible.  Feel free to call the clinic should you have any questions or concerns. The clinic phone number is [phone].  Please show the CHEMO ALERT CARD at check-in to the Emergency Department and triage nurse.  

## 2017-09-09 LAB — CANCER ANTIGEN 27.29: CA 27.29: 8.5 U/mL (ref 0.0–38.6)

## 2017-09-13 NOTE — Progress Notes (Signed)
[place]  [phone] [phone]) [phone]  Clinic Follow up Note   Patient Care Team: Dorena Cookey, MD as PCP - [name], MD as Consulting Physician (General Surgery) [name], MD as Consulting Physician (Hematology) Kyung Rudd, MD as Consulting Physician (Radiation Oncology) Brien Few, MD as Consulting Physician (Obstetrics and Gynecology)   Date of Service:  09/15/2017  CHIEF COMPLAINTS:  Follow up for Breast cancer metastasized to axillary lymph node, left   Oncology History   Cancer Staging Breast cancer metastasized to axillary lymph node, left [facility]) Staging form: Breast, AJCC 8th Edition - Clinical stage from 03/22/2017: Stage Unknown (cTX, cN1, cM0, GX, ER: Positive, PR: Positive, HER2: Negative) - Signed by [name], MD on 03/29/2017 - Pathologic stage from 04/27/2017: Stage IB (pT1c, pN2a, cM0, G2, ER: Positive, PR: Positive, HER2: Negative) - Signed by [name], MD on 05/25/2017 \     Breast cancer metastasized to axillary lymph node, left ([place])   03/21/2017 Mammogram    Diagnostic mammogram bilateral 03/21/17 IMPRESSION: INCOMPLETE - ADDITIONAL IMAGING EVALUATION NEEDED 1. The 0.6 cm oval mass in the right breast at 11 o'clock middle depth is indeterminate. An ultrasound is recommended.  2. The 0.5cm round focal asymmetry in the left breast central to the nipple middle depth is indeterminate. An ultrasound is recommended.  3. Ultrasound of the palpable abnormalities in the axilla and far left lateral breast is recommended.  4. Multiple clusters of pleomorphic calcifications in the left upper outer breast anterior depth spanning a 2.5 cm area are highly suggestive of malignancy. A stereotactic biopsy is recommended.        03/21/2017 Imaging    US Breast bilateral 03/21/17 IMPRESSION: HIGHLY SUGGESTIVE OF MALIGNANCY 1. Six distinct abnormal left axillary nodes, 2 of which correspond to the palpable lumps felt by the  patient. Findings concerning for metastatic adenopathy. Ultrasound guided biopsy of one of these nodes is recommended.  2. Small 5 mm palpable, oval mass in the left breast 3:00 areolar margin is at an intermediate suspicion. Ultrasound guided biopsy is recommended.  3. Isoechoic 8 mm mass in the right breast 10:00 5 cm from the nipple is also an intermediate suspicion for malignancy, and ultrasound guided biopsy is recommended.  4. Stereotactic guided biopsy for the highly suspicious calcifications in the left breast also recommended as detailed in the mammography report.        03/22/2017 Initial Biopsy    Diagnosis 03/22/17 Lymph node, needle/core biopsy, left axillary node -METASTATIC CARCINOMA, SEE COMMENT Microscopic Comment      03/22/2017 Receptors her2    Lymph node biopsy ER 95% positive, strong staining, PR 5% positive, weak staining, HER-2 negative, with HER2/CEP17 ratio 1.23 and copy #3.95.      03/27/2017 Pathology Results    Diagnosis 03/27/17 1. Breast, left, needle core biopsy -FIBROADENOMATOID NODULE WITH CALCIFICATIONS 2.Breast, left, needle core biopsy -DUCTAL CARCINOMA IN SITU, HIGH GRADE -SEE COMMENT  3. Breast, right, needle, core biopsy -FIBROADENOMA -NO MALIGNANCY IDENTIFIED       03/29/2017 Initial Diagnosis    Breast cancer metastasized to axillary lymph node, left ([place])      04/07/2017 Imaging    CT CAP 04/07/17 IMPRESSION: 1. Several prominent left axillary lymph nodes which may correspond to biopsy proven axillary lymph node metastasis. 2. No thoracic adenopathy identified or evidence of distant metastatic disease.      04/07/2017 Imaging    Bone whole body scan 04/07/17 IMPRESSION: Negative for evidence of  osseous metastatic disease.      04/17/2017 Genetic Testing    BRCA2 c.[id number] (p.Glu260Serfs*15) pathogenic mutation identified on the 9 gene STAT panel.  The STAT Breast cancer panel offered by [facility] includes sequencing and rearrangement  analysis for the following 9 genes:  ATM, BRCA1, BRCA2, CDH1, CHEK2, PALB2, PTEN, STK11 and TP53.   The report date is April 17, 2017.  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  The Hereditary Gene Panel offered by [facility] includes sequencing and/or deletion duplication testing of the following 46 genes: APC, ATM, AXIN2, BARD1, BMPR1A, BRCA1, BRCA2, BRIP1, CDH1, CDKN2A (p14ARF), CDKN2A (p16INK4a), CHEK2, CTNNA1, DICER1, EPCAM (Deletion/duplication testing only), GREM1 (promoter region deletion/duplication testing only), KIT, MEN1, MLH1, MSH2, MSH3, MSH6, MUTYH, NBN, NF1, NHTL1, PALB2, PDGFRA, PMS2, POLD1, POLE, PTEN, RAD50, RAD51C, RAD51D, SDHB, SDHC, SDHD, SMAD4, SMARCA4. STK11, TP53, TSC1, TSC2, and VHL.  The following genes were evaluated for sequence changes only: SDHA and HOXB13 c.251G>A variant only.  The report date is April 29, 2017.       04/27/2017 Surgery    Surgey 04/27/17 BILATERAL SKIN SPARING MASTECTOMY and LEFT AXILLARY LYMPH NODE and BILATERAL BREAST RECONSTRUCTION WITH PLACEMENT OF TISSUE EXPANDER AND FLEX HD (ACELLULAR HYDRATED DERMIS) Bilateral BY Dr. Barry Dienes and Dr. Marla Roe       04/27/2017 Pathology Results     Diagnosis  04/27/17 1. Breast, simple mastectomy, Right - FIBROCYSTIC CHANGES WITH ADENOSIS. - USUAL DUCTAL HYPERPLASIA. - HEALING BIOPSY SITE. - THERE IS NO EVIDENCE OF MALIGNANCY. 2. Breast, simple mastectomy, Left - INVASIVE DUCTAL CARCINOMA, GRADE II/III, SPANNING 1.2 CM. - DUCTAL CARCINOMA IN SITU WITH CALCIFICATIONS, HIGH GRADE. - INVASIVE DUCTAL CARCINOMA IS FOCALLY PRESENT AT THE ANTERIOR MARGIN. - DUCTAL CARCINOMA IN SITU IS BROADLY 0.1 CM TO THE POSTERIOR MARGIN. - SEE ONCOLOGY TABLE BELOW. 3. Lymph nodes, regional resection, Left axillary - METASTATIC CARCINOMA IN 8 OF 21 LYMPH NODES (8/21). - SEE COMMENT      06/09/2017 -  Chemotherapy     Adriamycin and Cytoxan every 2 weeks, for 4 cycles 06/09/17-07/21/17, followed by weekly Abraxane and carboplatin for 12 weeks starting 08/11/17. Held Carbo with cycle 2 due to poor toleration.         HISTORY OF PRESENTING ILLNESS: 03/29/17 Madison Savage 47 y.o. female is here because of newly diagnosed Breast cancer metastasized to axillary lymph node, left. She presents to Breast Clinic today with her husband. She felt the lump herself June 22nd. It was soft and "squishy" and it moved. First in the left breast and then four days latter in the left axilla. She feels they have gotten smaller.   In the past she was diagnosed with being ITP positive. Period has been irregular more since her last miscarriage in 2015. She has MTHFR gene. In pregnancy her plt counts have gone low. She has not seen homologist for this. All 3 children were vaginal births.  Today she reports that with the lumps she feels a dull ache that comes and goes. She has not noticed any nipple or skin change. She had not had a mammogram since she was 41. Her PCP was going to set her up with a mammogram last year. Dr. Ronita Hipps is her OB and Dr. Sherren Mocha is her current PCP. She feels no change in appetite or weight loss. She quit smoking 3 weeks ago. She works out Scientist, product/process development. She reports her last attempt at the MRI, the contrast made  her feel like she could not breathe, her head was pounding and she felt hot all over and she was shaking. The trouble breathing only lasted while she was in the MRI.  Husband shared concern with insurance and financial. And referrals to second opinions.    GYN HISTORY  Menarchal: 16 LMP: 03/05/2017 Contraceptive: none HRT: N/A G7P3: - 3 miscarriages, 1 abortion    CURRENT THERAPY: Adriamycin and Cytoxan every 2 weeks, for 4 cycles 06/09/17-07/21/17, followed by weekly Abraxane and carboplatin for 12 weeks starting 08/11/17. Held Carbo with cycle 2 due to poor toleration, after cycle 2 may change  Abraxane to Taxol as her insurance has denied Abraxane. Changed to Apraxane for Cycle 3 on 09/08/17  INTERVAL HISTORY:  Madison Savage is here for a follow up. She is doing well overall and reports that she tolerated her chemotherapy well after cycle 3. She was able to return to work and have normal effort. She states that she noticed some smoothness to her finger tips. She reports no major issue with her port other then some skin irritation. Pt notes she is doing well with ROM in her left arm and shoulder after completing physical therapy after being discharged from PT.   Pt requests to changer her appointment from 2/8 to 2/7 and to mover her next infusion to Saturday/skip it because her husband will be out of town. Pt will undergo breast reconstruction surgery in April and complete 6 weeks of radiation with Dr. Lisbeth Renshaw for 6 weeks following surgery.   On review of systems, pt denies any other concerns at this time.  Pertinent positives are listed and detailed within the above HPI.    MEDICAL HISTORY:  Past Medical History:  Diagnosis Date  . Anemia   . Anxiety   . Cancer ([place]) 03/2017   left breast cancer  . Chronic ITP (idiopathic thrombocytopenia) ([facility])   . Family history of breast cancer   . Family history of ovarian cancer   . Headache    last migraine...9/24  . Heart murmur    at birth.  none now  . MTHFR mutation [facility])     SURGICAL HISTORY: Past Surgical History:  Procedure Laterality Date  . AXILLARY LYMPH NODE DISSECTION Left 04/27/2017   Procedure: AXILLARY LYMPH NODE DISSECTION;  Surgeon: [name], MD;  Location: [place];  Service: General;  Laterality: Left;  . BILATERAL TOTAL MASTECTOMY WITH AXILLARY LYMPH NODE DISSECTION Bilateral 04/27/2017   Procedure: SKIN SPARING MASTECTOMY;  Surgeon: [name], MD;  Location: [place];  Service: General;  Laterality: Bilateral;  . BREAST RECONSTRUCTION WITH PLACEMENT OF TISSUE EXPANDER AND  FLEX HD (ACELLULAR HYDRATED DERMIS) Bilateral 04/27/2017   Procedure: BILATERAL BREAST RECONSTRUCTION WITH PLACEMENT OF TISSUE EXPANDER AND FLEX HD (ACELLULAR HYDRATED DERMIS);  Surgeon: Wallace Going, DO;  Location: [place];  Service: Plastics;  Laterality: Bilateral;  . BREAST SURGERY    . MASTECTOMY    . PORTACATH PLACEMENT Right 06/01/2017   Procedure: INSERTION PORT-A-CATH RIGHT SUBCLAVIAN;  Surgeon: [name], MD;  Location: [place];  Service: General;  Laterality: Right;  . WISDOM TOOTH EXTRACTION      SOCIAL HISTORY: Social History   Socioeconomic History  . Marital status: Married    Spouse name: Not on file  . Number of children: 3  . Years of education: Not on file  . Highest education level: Not on file  Social Needs  . Financial resource strain: Not on  file  . Food insecurity - worry: Not on file  . Food insecurity - inability: Not on file  . Transportation needs - medical: Not on file  . Transportation needs - non-medical: Not on file  Occupational History    Employer: UNEMPLOYED  Tobacco Use  . Smoking status: Former Smoker    Packs/day: 0.50    Years: 20.00    Pack years: 10.00    Types: Cigarettes    Last attempt to quit: 03/05/2017    Years since quitting: 0.5  . Smokeless tobacco: Never Used  Substance and Sexual Activity  . Alcohol use: No  . Drug use: No  . Sexual activity: Yes    Partners: Male    Birth control/protection: None  Other Topics Concern  . Not on file  Social History Narrative   Stay at home mom   3 children ages 75, 53, 50    FAMILY HISTORY: Family History  Problem Relation Age of Onset  . Breast cancer Paternal Aunt 70  . Ovarian cancer Paternal Aunt 31  . Cancer Paternal Grandmother        breast cancer   . Cancer Maternal Aunt        lymphoma   . Cancer Paternal Grandfather        throat cancer   . Cancer Paternal Aunt        lung cancer  . Cirrhosis Father   . AAA (abdominal aortic aneurysm)  Maternal Grandfather   . Leukemia Paternal Aunt     ALLERGIES:  is allergic to clindamycin; penicillins; and codeine.  MEDICATIONS:  Current Outpatient Medications  Medication Sig Dispense Refill  . Cyanocobalamin (VITAMIN B-12 PO) Take 1 tablet by mouth daily.     . diazepam (VALIUM) 2 MG tablet Take 1 tablet (2 mg total) by mouth every 6 (six) hours as needed for anxiety or muscle spasms. 30 tablet 0  . Multiple Vitamins-Calcium (ONE-A-DAY WOMENS PO) Take 1 tablet by mouth daily.    Marland Kitchen OVER THE COUNTER MEDICATION Take 1 capsule by mouth daily. Biotin/MSM    . prochlorperazine (COMPAZINE) 10 MG tablet Take 1 tablet (10 mg total) by mouth every 6 (six) hours as needed for nausea or vomiting. 30 tablet 1  . traMADol (ULTRAM) 50 MG tablet Take 1 tablet (50 mg total) by mouth every 6 (six) hours as needed. 15 tablet 1  . vitamin C (ASCORBIC ACID) 500 MG tablet Take 2,000 mg by mouth daily.     . chlorpheniramine-HYDROcodone (TUSSIONEX) 10-8 MG/5ML SUER Take 5 mLs by mouth at bedtime as needed for cough. (Patient not taking: Reported on 09/15/2017) 115 mL 0  . HYDROcodone-acetaminophen (NORCO/VICODIN) 5-325 MG tablet Take 1 tablet by mouth every 4 (four) hours as needed for moderate pain. (Patient not taking: Reported on 09/15/2017) 30 tablet 0   Current Facility-Administered Medications  Medication Dose Route Frequency Provider Last Rate Last Dose  . LORazepam (ATIVAN) injection 0.5 mg  0.5 mg Intravenous Once [name], MD       Facility-Administered Medications Ordered in Other Visits  Medication Dose Route Frequency Provider Last Rate Last Dose  . heparin lock flush 100 unit/mL  500 Units Intracatheter Once PRN [name], MD      . PACLitaxel-protein bound (ABRAXANE) chemo infusion 150 mg  100 mg/m2 (Treatment Plan Recorded) Intravenous Once [name], MD      . prochlorperazine (COMPAZINE) tablet 10 mg  10 mg Oral Q6H PRN [name], MD      .  sodium chloride flush (NS) 0.9 % injection 10 mL   10 mL Intravenous PRN [name], MD   10 mL at 07/21/17 1323  . sodium chloride flush (NS) 0.9 % injection 10 mL  10 mL Intracatheter PRN [name], MD        REVIEW OF SYSTEMS:   Constitutional: Denies fevers, chills or abnormal night sweats  Eyes: Denies blurriness of vision, double vision or watery eyes Ears, nose, mouth, throat, and face: Denies mucositis or sore throat  Respiratory: Denies dyspnea or wheezes (+) dry cough much improved Cardiovascular: Denies palpitation, chest discomfort or lower extremity swelling Gastrointestinal:  Denies nausea, heartburn or change in bowel habits Skin: Denies abnormal skin rashes (+) palpable boil in right axilla  Lymphatics: Denies new lymphadenopathy or easy bruising Breast: (+) pinching in chest post surgery Neurological:Denies numbness, tingling or new weaknesses Behavioral/Psych: Mood is stable, no new changes  All other systems were reviewed with the patient and are negative.   PHYSICAL EXAMINATION: ECOG PERFORMANCE STATUS: 0 - Asymptomatic  Vitals:   09/15/17 1014  BP: (!) 100/57  Pulse: 80  Resp: 18  Temp: 98.2 F (36.8 C)  SpO2: 99%   Filed Weights   09/15/17 1014  Weight: 122 lb 14.4 oz (55.7 kg)     GENERAL:alert, no distress and comfortable SKIN: skin color, texture, turgor are normal, no rashes or significant lesions (+) a very small boil in right axilla  EYES: normal, conjunctiva are pink and non-injected, sclera clear OROPHARYNX:no exudate, no erythema and lips, buccal mucosa, and tongue normal  NECK: supple, thyroid normal size, non-tender, without nodularity LYMPH:  no palpable lymphadenopathy in the cervical, axillary or inguinal LUNGS: clear to auscultation and percussion with normal breathing effort HEART: regular rate & rhythm and no murmurs and no lower extremity edema ABDOMEN:abdomen soft, non-tender and normal bowel sounds Musculoskeletal:no cyanosis of digits and no clubbing  PSYCH: alert & oriented x 3  with fluent speech NEURO: no focal motor/sensory deficits Breast: Status post bilateral mastectomy and tissue expander placements, surgical incisions have healed well, no discharge or skin erythema.   LABORATORY DATA:  I have reviewed the data as listed CBC Latest Ref Rng & Units 09/15/2017 09/08/2017 09/01/2017  WBC 3.9 - 10.3 K/uL 3.4(L) 5.1 5.3  Hemoglobin 11.6 - 15.9 g/dL 11.9 13.0 13.0  Hematocrit 34.8 - 46.6 % 35.7 38.8 38.6  Platelets 145 - 400 K/uL 162 135(L) 160    CMP Latest Ref Rng & Units 09/15/2017 09/08/2017 09/01/2017  Glucose 70 - 140 mg/dL 96 99 91  BUN 7 - 26 mg/dL 14 13.6 13.5  Creatinine 0.60 - 1.10 mg/dL 0.78 0.8 0.8  Sodium 136 - 145 mmol/L 141 139 140  Potassium 3.3 - 4.7 mmol/L 4.0 4.0 4.1  Chloride 98 - 109 mmol/L 110(H) - -  CO2 22 - 29 mmol/L '25 26 26  '$ Calcium 8.4 - 10.4 mg/dL 8.7 9.1 9.4  Total Protein 6.4 - 8.3 g/dL 6.0(L) 6.3(L) 6.5  Total Bilirubin 0.2 - 1.2 mg/dL <0.2(L) 0.30 <0.22  Alkaline Phos 40 - 150 U/L 71 75 72  AST 5 - 34 U/L '12 14 15  '$ ALT 0 - 55 U/L '15 14 21   '$ PATHOLOGY   Diagnosis  04/27/17 1. Breast, simple mastectomy, Right - FIBROCYSTIC CHANGES WITH ADENOSIS. - USUAL DUCTAL HYPERPLASIA. - HEALING BIOPSY SITE. - THERE IS NO EVIDENCE OF MALIGNANCY. 2. Breast, simple mastectomy, Left - INVASIVE DUCTAL CARCINOMA, GRADE II/III, SPANNING 1.2 CM. - DUCTAL CARCINOMA IN  SITU WITH CALCIFICATIONS, HIGH GRADE. - INVASIVE DUCTAL CARCINOMA IS FOCALLY PRESENT AT THE ANTERIOR MARGIN. - DUCTAL CARCINOMA IN SITU IS BROADLY 0.1 CM TO THE POSTERIOR MARGIN. - SEE ONCOLOGY TABLE BELOW. 3. Lymph nodes, regional resection, Left axillary - METASTATIC CARCINOMA IN 8 OF 21 LYMPH NODES (8/21). - SEE COMMENT. Microscopic Comment 2. BREAST, INVASIVE TUMOR Procedure: Simple mastectomy and axillary lymph node resections. Laterality: Left. Tumor Size: 1.2 cm (glass slide measurement). Histologic Type: Ductal Grade: II Tubular Differentiation: 3 Nuclear  Pleomorphism: 3 Mitotic Count: 1 Ductal Carcinoma in Situ (DCIS): Present, high grade, extensive. Extent of Tumor: Confined to breast parenchyma. Margins: Invasive carcinoma, distance from closest margin: Focally present at the anterior margin. DCIS, distance from closest margin: Broadly less than 0.1 cm to the posterior margin. Regional Lymph Nodes: Number of Lymph Nodes Examined: 21 1 of 4 FINAL for Madison Savage, Madison Savage [phone]) Microscopic Comment(continued) Lymph Nodes with Macrometastases: 6 Lymph Nodes with Micrometastases: 0 Lymph Nodes with Isolated Tumor Cells: 2 Breast Prognostic Profile: [id number] Estrogen Receptor: 95%, strong. Progesterone Receptor: 5%, strong. Her2: No amplification was detected. The ratio is 1.23. Ki-67: 15%. Best tumor block for sendout testing: 2G Pathologic Stage Classification (pTNM, AJCC 8th Edition): Primary Tumor (pT): pT1c Regional Lymph Nodes (pN): pN2a Distant Metastases (pM): pMX Comments: Grossly, there is a 2.5 cm focus of indurated tissue identified. Histologic examination of this area reveals predominately high grade ductal carcinoma in situ with calcifications. There is associated invasive ductal carcinoma, which is E-cadherin positive, spanning 1.2 cm. (JBK:gt, 05/02/17) 3. Immunohistochemical stains for cytokeratin AE1/AE3 performed on multiple blocks on part 3 help highlight the presence of metastatic carcinoma. (JBK:gt, 05/02/17)   Diagnosis 03/27/17 1. Breast, left, needle core biopsy -FIBROADENOMATOID NODULE WITH CALCIFICATIONS 2.Breast, left, needle core biopsy -DUCTAL CARCINOMA IN SITU, HIGH GRADE -SEE COMMENT  3. Breast, right, needle, core biopsy -FIBROADENOMA -NO MALIGNANCY IDENTIFIED  Diagnosis 03/22/17 Lymph node, needle/core biopsy, left axillary node -METASTATIC CARCINOMA, SEE COMMENT Microscopic Comment Sections showed lymph node tissue mostly replaced by metastatic carcinoma characterized by sheets of  epithelioid appearing cells displaying milder to moderate nuclear pleomorphism, fine chromatin, small nucleoli, and moderately abundant amphophilic to eosinophilic cyto-plasma. Immunohistochemistry staining showed positive for cytokeratin AE1/AE3, GATA-3 and estrogen receptor, with patchy positivity for cytokeratin 7 and GCDFP., Only scattered cells showed progesterone positivity. Negative for S100, melan-a, E-cadherin, cytokeratin 20, CDX-2, TTF-1 or WT-1. The findings are consistent with metastatic carcinoma of breast primary. Lack of significant e-cadherin positivity suggested lobular carcinoma.   GENETICS 04/10/17    PROCEDURES   ECHO 05/31/17 Study Conclusions - Left ventricle: The cavity size was normal. Wall thickness was   normal. Systolic function was normal. The estimated ejection   fraction was in the range of 55% to 60%. Wall motion was normal;   there were no regional wall motion abnormalities. Impressions: - Normal LV systolic and diastolic function; global longitudinal   strain - 17.9%.  RADIOGRAPHIC STUDIES: I have personally reviewed the radiological images as listed and agreed with the findings in the report.  CT CAP 04/07/17 IMPRESSION: 1. Several prominent left axillary lymph nodes which may correspond to biopsy proven axillary lymph node metastasis. 2. No thoracic adenopathy identified or evidence of distant metastatic disease.   Bone whole body scan 04/07/17 IMPRESSION: Negative for evidence of osseous metastatic disease.  Diagnostic mammogram bilateral 03/21/17 IMPRESSION: INCOMPLETE - ADDITIONAL IMAGING EVALUATION NEEDED 1. The 0.6 cm oval mass in the right breast at 11 o'clock middle depth is indeterminate.  An ultrasound is recommended.  2. The 0.5cm round focal asymmetry in the left breast central to the nipple middle depth is indeterminate. An ultrasound is recommended.  3. Ultrasound of the palpable abnormalities in the axilla and far left lateral breast  is recommended.  4. Multiple clusters of pleomorphic calcifications in the left upper outer breast anterior depth spanning a 2.5 cm area are highly suggestive of malignancy. A stereotactic biopsy is recommended.   US Breast bilateral 03/21/17 IMPRESSION: HIGHLY SUGGESTIVE OF MALIGNANCY 1. Six distinct abnormal left axillary nodes, 2 of which correspond to the palpable lumps felt by the patient. Findings concerning for metastatic adenopathy. Ultrasound guided biopsy of one of these nodes is recommended.  2. Small 5 mm palpable, oval mass in the left breast 3:00 areolar margin is at an intermediate suspicion. Ultrasound guided biopsy is recommended.  3. Isoechoic 8 mm mass in the right breast 10:00 5 cm from the nipple is also an intermediate suspicion for malignancy, and ultrasound guided biopsy is recommended.  4. Stereotactic guided biopsy for the highly suspicious calcifications in the left breast also recommended as detailed in the mammography report.    ASSESSMENT & PLAN:  Madison Savage is a 47 y.o. perimenopausal female with a history of Chronic ITP and MTHFR mutation, presented with a palpable left axillary adenopathy.  1. 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 She had a 1.2 cm left breast invasive ductal carcinoma, with 8 positive lymph nodes. Surgical margins were negative. -her staging CT and bone scan was negative -We discussed the risk of cancer recurrence after surgery. Due to her multiple positive lymph nodes, young age, she has high risk for recurrence.  -I recommended adjuvant chemotherapy to reduce her risk of recurrence. I recommend standard dose dense Adriamycin and Cytoxan every 2 weeks, for  4 cycles, followed by weekly Taxol or Abraxane for 12 weeks. Due to her BRCA2 mutation, and locally advanced disease, she probably would benefit from carboplatin also, I plan to add [place] to weekly Taxol. -Her Baseline 9/26/148 ECHO was normal -She started Adjuvant AC on 06/09/17, tolerating well so far, some fatigue, improves with IVF on week 2. Pt has been using Cold cap and it has been working for her thus far.  -She agreed to weekly Abraxane and Carboplatin and will continue Cold Cap. Due to worsened cold symptoms she started on 08/11/17.  -She did not tolerate the first cycle Carbo and Abraxane well. Per her request, I held [place] with cycle 2 and she tolerated abraxane alone very well -Her insurance has denied her Abraxane even after appeal.  Patient is very reluctant to try Taxol due to concern of side effects, will continue Abraxane, will apply for free drug replacement for her. -Pt is doing well on Abraxane and we will proceed with Cycle 4 today (09/15/17).  -I advised her to continue to watch for numbness/tingling in her finger tips and toes. -Discussed her labs today (09/15/17) that are WNL -I discussed recovery time for breast reconstruction surgery 4 weeks after last chemotherapy (12/2017). She will begin 6 weeks of radiation with Dr. Lisbeth Renshaw following surgery. Pt is also planning to have hysterectomy -I discussed cancer surveillance after she completes adjuvant therapy.  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 -Her platelets have overall remained stable while on chemotherapy.   3. Genetics, BRCA2 mutation positive -She has history of MTHFR mutation, which was discovered during her workup  before multiple miscarriage. 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 -She has had bilateral mastectomy, due to the high-risk of breast cancer -She planned to have hysterectomy and BSO after she completes chemo, radiation and breast reconstruction -Patient has met our genetic counselor Mrs. Florene Glen, BRCA2 G mutation screening in her children and siblings were recommended.    4. Chest pain, secondary to [facility] -Managed with Norco as needed, much better now  -Manageable, continue monitoring    PLAN:  -Continue weekly Abraxane cycle 4 today and continue weekly -Her husband will be out of town next week, she would like to cancer next week treatment -F/u in 3 weeks    No orders of the defined types were placed in this encounter.   All questions were answered. The patient knows to call the clinic with any problems, questions or concerns. I spent 15 minutes counseling the patient face to face. The total time spent in the appointment was 20 minutes and more than 50% was on counseling.     [name], MD 09/15/2017   This document serves as a record of services personally performed by [name], MD. It was created on her behalf by Theresia Bough, a trained medical scribe. The creation of this record is based on the scribe's personal observations and the provider's statements to them.   I have reviewed the above documentation for accuracy and completeness, and I agree with the above.

## 2017-09-15 ENCOUNTER — Inpatient Hospital Stay: Payer: BLUE CROSS/BLUE SHIELD

## 2017-09-15 ENCOUNTER — Encounter: Payer: Self-pay | Admitting: Hematology

## 2017-09-15 ENCOUNTER — Encounter: Payer: Self-pay | Admitting: *Deleted

## 2017-09-15 ENCOUNTER — Inpatient Hospital Stay (HOSPITAL_BASED_OUTPATIENT_CLINIC_OR_DEPARTMENT_OTHER): Payer: BLUE CROSS/BLUE SHIELD | Admitting: Hematology

## 2017-09-15 ENCOUNTER — Inpatient Hospital Stay: Payer: BLUE CROSS/BLUE SHIELD | Attending: Hematology

## 2017-09-15 VITALS — BP 100/57 | HR 80 | Temp 98.2°F | Resp 18 | Ht 67.5 in | Wt 122.9 lb

## 2017-09-15 DIAGNOSIS — Z801 Family history of malignant neoplasm of trachea, bronchus and lung: Secondary | ICD-10-CM | POA: Diagnosis not present

## 2017-09-15 DIAGNOSIS — Z807 Family history of other malignant neoplasms of lymphoid, hematopoietic and related tissues: Secondary | ICD-10-CM | POA: Diagnosis not present

## 2017-09-15 DIAGNOSIS — C50912 Malignant neoplasm of unspecified site of left female breast: Secondary | ICD-10-CM

## 2017-09-15 DIAGNOSIS — R079 Chest pain, unspecified: Secondary | ICD-10-CM | POA: Insufficient documentation

## 2017-09-15 DIAGNOSIS — Z803 Family history of malignant neoplasm of breast: Secondary | ICD-10-CM | POA: Diagnosis not present

## 2017-09-15 DIAGNOSIS — C773 Secondary and unspecified malignant neoplasm of axilla and upper limb lymph nodes: Secondary | ICD-10-CM | POA: Diagnosis not present

## 2017-09-15 DIAGNOSIS — Z5111 Encounter for antineoplastic chemotherapy: Secondary | ICD-10-CM | POA: Insufficient documentation

## 2017-09-15 DIAGNOSIS — Z808 Family history of malignant neoplasm of other organs or systems: Secondary | ICD-10-CM

## 2017-09-15 DIAGNOSIS — D693 Immune thrombocytopenic purpura: Secondary | ICD-10-CM | POA: Insufficient documentation

## 2017-09-15 DIAGNOSIS — Z95828 Presence of other vascular implants and grafts: Secondary | ICD-10-CM

## 2017-09-15 DIAGNOSIS — C50412 Malignant neoplasm of upper-outer quadrant of left female breast: Secondary | ICD-10-CM | POA: Diagnosis not present

## 2017-09-15 DIAGNOSIS — Z87891 Personal history of nicotine dependence: Secondary | ICD-10-CM | POA: Diagnosis not present

## 2017-09-15 DIAGNOSIS — Z8041 Family history of malignant neoplasm of ovary: Secondary | ICD-10-CM

## 2017-09-15 LAB — COMPREHENSIVE METABOLIC PANEL
ALT: 15 U/L (ref 0–55)
AST: 12 U/L (ref 5–34)
Albumin: 3.7 g/dL (ref 3.5–5.0)
Alkaline Phosphatase: 71 U/L (ref 40–150)
Anion gap: 6 (ref 3–11)
BUN: 14 mg/dL (ref 7–26)
CO2: 25 mmol/L (ref 22–29)
Calcium: 8.7 mg/dL (ref 8.4–10.4)
Chloride: 110 mmol/L — ABNORMAL HIGH (ref 98–109)
Creatinine, Ser: 0.78 mg/dL (ref 0.60–1.10)
GFR calc Af Amer: >60 mL/min (ref >60)
GFR calc non Af Amer: >60 mL/min (ref >60)
Glucose, Bld: 96 mg/dL (ref 70–140)
Potassium: 4 mmol/L (ref 3.3–4.7)
Sodium: 141 mmol/L (ref 136–145)
Total Bilirubin: <0.2 mg/dL — ABNORMAL LOW (ref 0.2–1.2)
Total Protein: 6 g/dL — ABNORMAL LOW (ref 6.4–8.3)

## 2017-09-15 LAB — CBC WITH DIFFERENTIAL/PLATELET
Basophils Absolute: 0 10*3/uL (ref 0.0–0.1)
Basophils Relative: 1 %
Eosinophils Absolute: 0.1 10*3/uL (ref 0.0–0.5)
Eosinophils Relative: 4 %
HCT: 35.7 % (ref 34.8–46.6)
Hemoglobin: 11.9 g/dL (ref 11.6–15.9)
Lymphocytes Relative: 26 %
Lymphs Abs: 0.9 10*3/uL (ref 0.9–3.3)
MCH: 31.3 pg (ref 25.1–34.0)
MCHC: 33.4 g/dL (ref 31.5–36.0)
MCV: 93.8 fL (ref 79.5–101.0)
Monocytes Absolute: 0.2 10*3/uL (ref 0.1–0.9)
Monocytes Relative: 6 %
Neutro Abs: 2.2 10*3/uL (ref 1.5–6.5)
Neutrophils Relative %: 63 %
Platelets: 162 10*3/uL (ref 145–400)
RBC: 3.81 MIL/uL (ref 3.70–5.45)
RDW: 15.3 % (ref 11.2–16.1)
WBC: 3.4 10*3/uL — ABNORMAL LOW (ref 3.9–10.3)

## 2017-09-15 MED ORDER — PACLITAXEL PROTEIN-BOUND CHEMO INJECTION 100 MG
100.0000 mg/m2 | Freq: Once | INTRAVENOUS | Status: AC
  Administered 2017-09-15: 150 mg via INTRAVENOUS
  Filled 2017-09-15: qty 30

## 2017-09-15 MED ORDER — HEPARIN SOD (PORK) LOCK FLUSH 100 UNIT/ML IV SOLN
500.0000 [IU] | Freq: Once | INTRAVENOUS | Status: AC | PRN
  Administered 2017-09-15: 500 [IU]
  Filled 2017-09-15: qty 5

## 2017-09-15 MED ORDER — SODIUM CHLORIDE 0.9% FLUSH
10.0000 mL | Freq: Once | INTRAVENOUS | Status: AC
  Administered 2017-09-15: 10 mL
  Filled 2017-09-15: qty 10

## 2017-09-15 MED ORDER — SODIUM CHLORIDE 0.9 % IV SOLN
Freq: Once | INTRAVENOUS | Status: AC
  Administered 2017-09-15: 12:00:00 via INTRAVENOUS

## 2017-09-15 MED ORDER — SODIUM CHLORIDE 0.9% FLUSH
10.0000 mL | INTRAVENOUS | Status: DC | PRN
  Administered 2017-09-15: 10 mL
  Filled 2017-09-15: qty 10

## 2017-09-15 MED ORDER — PROCHLORPERAZINE MALEATE 10 MG PO TABS: 10.0000 mg | ORAL_TABLET | Freq: Four times a day (QID) | ORAL | Status: DC | PRN

## 2017-09-15 NOTE — Patient Instructions (Signed)
[facility] Discharge Instructions for Patients Receiving Chemotherapy  Today you received the following chemotherapy agents: Paclitaxel-protein bound (Abraxane)  To help prevent nausea and vomiting after your treatment, we encourage you to take your nausea medication as prescribed.    If you develop nausea and vomiting that is not controlled by your nausea medication, call the clinic.   BELOW ARE SYMPTOMS THAT SHOULD BE REPORTED IMMEDIATELY:  *FEVER GREATER THAN 100.5 F  *CHILLS WITH OR WITHOUT FEVER  NAUSEA AND VOMITING THAT IS NOT CONTROLLED WITH YOUR NAUSEA MEDICATION  *UNUSUAL SHORTNESS OF BREATH  *UNUSUAL BRUISING OR BLEEDING  TENDERNESS IN MOUTH AND THROAT WITH OR WITHOUT PRESENCE OF ULCERS  *URINARY PROBLEMS  *BOWEL PROBLEMS  UNUSUAL RASH Items with * indicate a potential emergency and should be followed up as soon as possible.  Feel free to call the clinic should you have any questions or concerns. The clinic phone number is [phone].  Please show the CHEMO ALERT CARD at check-in to the Emergency Department and triage nurse.   

## 2017-09-15 NOTE — Patient Instructions (Signed)
Implanted Port Home Guide An implanted port is a type of central line that is placed under the skin. Central lines are used to provide IV access when treatment or nutrition needs to be given through a person's veins. Implanted ports are used for long-term IV access. An implanted port may be placed because:  You need IV medicine that would be irritating to the small veins in your hands or arms.  You need long-term IV medicines, such as antibiotics.  You need IV nutrition for a long period.  You need frequent blood draws for lab tests.  You need dialysis.  Implanted ports are usually placed in the chest area, but they can also be placed in the upper arm, the abdomen, or the leg. An implanted port has two main parts:  Reservoir. The reservoir is round and will appear as a small, raised area under your skin. The reservoir is the part where a needle is inserted to give medicines or draw blood.  Catheter. The catheter is a thin, flexible tube that extends from the reservoir. The catheter is placed into a large vein. Medicine that is inserted into the reservoir goes into the catheter and then into the vein.  How will I care for my incision site? Do not get the incision site wet. Bathe or shower as directed by your health care provider. How is my port accessed? Special steps must be taken to access the port:  Before the port is accessed, a numbing cream can be placed on the skin. This helps numb the skin over the port site.  Your health care provider uses a sterile technique to access the port. ? Your health care provider must put on a mask and sterile gloves. ? The skin over your port is cleaned carefully with an antiseptic and allowed to dry. ? The port is gently pinched between sterile gloves, and a needle is inserted into the port.  Only "non-coring" port needles should be used to access the port. Once the port is accessed, a blood return should be checked. This helps ensure that the port  is in the vein and is not clogged.  If your port needs to remain accessed for a constant infusion, a clear (transparent) bandage will be placed over the needle site. The bandage and needle will need to be changed every week, or as directed by your health care provider.  Keep the bandage covering the needle clean and dry. Do not get it wet. Follow your health care provider's instructions on how to take a shower or bath while the port is accessed.  If your port does not need to stay accessed, no bandage is needed over the port.  What is flushing? Flushing helps keep the port from getting clogged. Follow your health care provider's instructions on how and when to flush the port. Ports are usually flushed with saline solution or a medicine called heparin. The need for flushing will depend on how the port is used.  If the port is used for intermittent medicines or blood draws, the port will need to be flushed: ? After medicines have been given. ? After blood has been drawn. ? As part of routine maintenance.  If a constant infusion is running, the port may not need to be flushed.  How long will my port stay implanted? The port can stay in for as long as your health care provider thinks it is needed. When it is time for the port to come out, surgery will be   done to remove it. The procedure is similar to the one performed when the port was put in. When should I seek immediate medical care? When you have an implanted port, you should seek immediate medical care if:  You notice a bad smell coming from the incision site.  You have swelling, redness, or drainage at the incision site.  You have more swelling or pain at the port site or the surrounding area.  You have a fever that is not controlled with medicine.  This information is not intended to replace advice given to you by your health care provider. Make sure you discuss any questions you have with your health care provider. Document  Released: 08/22/2005 Document Revised: 01/28/2016 Document Reviewed: 04/29/2013 Elsevier Interactive Patient Education  2017 [employer] Inc.  

## 2017-09-18 ENCOUNTER — Telehealth: Payer: Self-pay | Admitting: Hematology

## 2017-09-18 NOTE — Telephone Encounter (Signed)
Left message for patient regarding upcoming February appointment updates per 1/11 sch message

## 2017-09-19 ENCOUNTER — Telehealth: Payer: Self-pay | Admitting: Hematology

## 2017-09-19 NOTE — Telephone Encounter (Signed)
Per 1/11 los (amended) cxd 1/18 appointments. Appointments for 2/8 already moved to 2/7 per initial 1/11 los. Left messag for patient re cancellation and confirmed next appointment for 2/1.

## 2017-09-22 ENCOUNTER — Ambulatory Visit: Payer: BLUE CROSS/BLUE SHIELD

## 2017-09-22 ENCOUNTER — Ambulatory Visit: Payer: BLUE CROSS/BLUE SHIELD | Admitting: Nurse Practitioner

## 2017-09-22 ENCOUNTER — Other Ambulatory Visit: Payer: BLUE CROSS/BLUE SHIELD

## 2017-09-29 ENCOUNTER — Inpatient Hospital Stay: Payer: BLUE CROSS/BLUE SHIELD

## 2017-09-29 ENCOUNTER — Encounter: Payer: Self-pay | Admitting: *Deleted

## 2017-09-29 VITALS — BP 101/55 | HR 92 | Temp 98.5°F | Resp 18

## 2017-09-29 DIAGNOSIS — C50412 Malignant neoplasm of upper-outer quadrant of left female breast: Secondary | ICD-10-CM | POA: Diagnosis not present

## 2017-09-29 DIAGNOSIS — Z87891 Personal history of nicotine dependence: Secondary | ICD-10-CM | POA: Diagnosis not present

## 2017-09-29 DIAGNOSIS — D693 Immune thrombocytopenic purpura: Secondary | ICD-10-CM | POA: Diagnosis not present

## 2017-09-29 DIAGNOSIS — Z801 Family history of malignant neoplasm of trachea, bronchus and lung: Secondary | ICD-10-CM | POA: Diagnosis not present

## 2017-09-29 DIAGNOSIS — C773 Secondary and unspecified malignant neoplasm of axilla and upper limb lymph nodes: Principal | ICD-10-CM

## 2017-09-29 DIAGNOSIS — R079 Chest pain, unspecified: Secondary | ICD-10-CM | POA: Diagnosis not present

## 2017-09-29 DIAGNOSIS — C50912 Malignant neoplasm of unspecified site of left female breast: Secondary | ICD-10-CM

## 2017-09-29 DIAGNOSIS — Z807 Family history of other malignant neoplasms of lymphoid, hematopoietic and related tissues: Secondary | ICD-10-CM | POA: Diagnosis not present

## 2017-09-29 DIAGNOSIS — Z808 Family history of malignant neoplasm of other organs or systems: Secondary | ICD-10-CM | POA: Diagnosis not present

## 2017-09-29 DIAGNOSIS — Z5111 Encounter for antineoplastic chemotherapy: Secondary | ICD-10-CM | POA: Diagnosis not present

## 2017-09-29 DIAGNOSIS — Z8041 Family history of malignant neoplasm of ovary: Secondary | ICD-10-CM | POA: Diagnosis not present

## 2017-09-29 DIAGNOSIS — Z803 Family history of malignant neoplasm of breast: Secondary | ICD-10-CM | POA: Diagnosis not present

## 2017-09-29 LAB — COMPREHENSIVE METABOLIC PANEL
ALT: 12 U/L (ref 0–55)
AST: 13 U/L (ref 5–34)
Albumin: 3.7 g/dL (ref 3.5–5.0)
Alkaline Phosphatase: 74 U/L (ref 40–150)
Anion gap: 9 (ref 3–11)
BUN: 13 mg/dL (ref 7–26)
CO2: 25 mmol/L (ref 22–29)
Calcium: 9 mg/dL (ref 8.4–10.4)
Chloride: 106 mmol/L (ref 98–109)
Creatinine, Ser: 0.89 mg/dL (ref 0.60–1.10)
GFR calc Af Amer: >60 mL/min (ref >60)
GFR calc non Af Amer: >60 mL/min (ref >60)
Glucose, Bld: 100 mg/dL (ref 70–140)
Potassium: 4.1 mmol/L (ref 3.3–4.7)
Sodium: 140 mmol/L (ref 136–145)
Total Bilirubin: 0.3 mg/dL (ref 0.2–1.2)
Total Protein: 6.1 g/dL — ABNORMAL LOW (ref 6.4–8.3)

## 2017-09-29 LAB — CBC WITH DIFFERENTIAL/PLATELET
Basophils Absolute: 0 10*3/uL (ref 0.0–0.1)
Basophils Relative: 1 %
Eosinophils Absolute: 0 10*3/uL (ref 0.0–0.5)
Eosinophils Relative: 1 %
HCT: 37.1 % (ref 34.8–46.6)
Hemoglobin: 12.4 g/dL (ref 11.6–15.9)
Lymphocytes Relative: 27 %
Lymphs Abs: 0.8 10*3/uL — ABNORMAL LOW (ref 0.9–3.3)
MCH: 31.8 pg (ref 25.1–34.0)
MCHC: 33.4 g/dL (ref 31.5–36.0)
MCV: 95 fL (ref 79.5–101.0)
Monocytes Absolute: 0.4 10*3/uL (ref 0.1–0.9)
Monocytes Relative: 13 %
Neutro Abs: 1.8 10*3/uL (ref 1.5–6.5)
Neutrophils Relative %: 58 %
Platelets: 213 10*3/uL (ref 145–400)
RBC: 3.9 MIL/uL (ref 3.70–5.45)
RDW: 14.5 % (ref 11.2–16.1)
WBC: 3.1 10*3/uL — ABNORMAL LOW (ref 3.9–10.3)

## 2017-09-29 MED ORDER — SODIUM CHLORIDE 0.9 % IV SOLN
Freq: Once | INTRAVENOUS | Status: AC
  Administered 2017-09-29: 11:00:00 via INTRAVENOUS

## 2017-09-29 MED ORDER — HEPARIN SOD (PORK) LOCK FLUSH 100 UNIT/ML IV SOLN
500.0000 [IU] | Freq: Once | INTRAVENOUS | Status: AC | PRN
  Administered 2017-09-29: 500 [IU]
  Filled 2017-09-29: qty 5

## 2017-09-29 MED ORDER — PROCHLORPERAZINE MALEATE 10 MG PO TABS: 10.0000 mg | ORAL_TABLET | Freq: Four times a day (QID) | ORAL | Status: DC | PRN

## 2017-09-29 MED ORDER — PACLITAXEL PROTEIN-BOUND CHEMO INJECTION 100 MG
100.0000 mg/m2 | Freq: Once | INTRAVENOUS | Status: AC
  Administered 2017-09-29: 150 mg via INTRAVENOUS
  Filled 2017-09-29: qty 30

## 2017-09-29 MED ORDER — SODIUM CHLORIDE 0.9% FLUSH
10.0000 mL | INTRAVENOUS | Status: DC | PRN
  Administered 2017-09-29: 10 mL
  Filled 2017-09-29: qty 10

## 2017-09-29 NOTE — Patient Instructions (Signed)
[facility] Discharge Instructions for Patients Receiving Chemotherapy  Today you received the following chemotherapy agents:  Abraxane.  To help prevent nausea and vomiting after your treatment, we encourage you to take your nausea medication as directed.   If you develop nausea and vomiting that is not controlled by your nausea medication, call the clinic.   BELOW ARE SYMPTOMS THAT SHOULD BE REPORTED IMMEDIATELY:  *FEVER GREATER THAN 100.5 F  *CHILLS WITH OR WITHOUT FEVER  NAUSEA AND VOMITING THAT IS NOT CONTROLLED WITH YOUR NAUSEA MEDICATION  *UNUSUAL SHORTNESS OF BREATH  *UNUSUAL BRUISING OR BLEEDING  TENDERNESS IN MOUTH AND THROAT WITH OR WITHOUT PRESENCE OF ULCERS  *URINARY PROBLEMS  *BOWEL PROBLEMS  UNUSUAL RASH Items with * indicate a potential emergency and should be followed up as soon as possible.  Feel free to call the clinic should you have any questions or concerns. The clinic phone number is [phone].  Please show the CHEMO ALERT CARD at check-in to the Emergency Department and triage nurse.   

## 2017-10-04 NOTE — Progress Notes (Signed)
[place]  [phone]) [phone] Fax:(336) [phone]  Clinic Follow up Note   Patient Care Team: Dorena Cookey, MD as PCP - [name], MD as Consulting Physician (General Surgery) [name], MD as Consulting Physician (Hematology) Kyung Rudd, MD as Consulting Physician (Radiation Oncology) Brien Few, MD as Consulting Physician (Obstetrics and Gynecology)   Date of Service:  10/06/2017  CHIEF COMPLAINTS:  Follow up for Breast cancer metastasized to axillary lymph node, left   Oncology History   Cancer Staging Breast cancer metastasized to axillary lymph node, left [facility]) Staging form: Breast, AJCC 8th Edition - Clinical stage from 03/22/2017: Stage Unknown (cTX, cN1, cM0, GX, ER: Positive, PR: Positive, HER2: Negative) - Signed by [name], MD on 03/29/2017 - Pathologic stage from 04/27/2017: Stage IB (pT1c, pN2a, cM0, G2, ER: Positive, PR: Positive, HER2: Negative) - Signed by [name], MD on 05/25/2017 \     Breast cancer metastasized to axillary lymph node, left ([place])   03/21/2017 Mammogram    Diagnostic mammogram bilateral 03/21/17 IMPRESSION: INCOMPLETE - ADDITIONAL IMAGING EVALUATION NEEDED 1. The 0.6 cm oval mass in the right breast at 11 o'clock middle depth is indeterminate. An ultrasound is recommended.  2. The 0.5cm round focal asymmetry in the left breast central to the nipple middle depth is indeterminate. An ultrasound is recommended.  3. Ultrasound of the palpable abnormalities in the axilla and far left lateral breast is recommended.  4. Multiple clusters of pleomorphic calcifications in the left upper outer breast anterior depth spanning a 2.5 cm area are highly suggestive of malignancy. A stereotactic biopsy is recommended.        03/21/2017 Imaging    US Breast bilateral 03/21/17 IMPRESSION: HIGHLY SUGGESTIVE OF MALIGNANCY 1. Six distinct abnormal left axillary nodes, 2 of which correspond to the palpable lumps felt by the  patient. Findings concerning for metastatic adenopathy. Ultrasound guided biopsy of one of these nodes is recommended.  2. Small 5 mm palpable, oval mass in the left breast 3:00 areolar margin is at an intermediate suspicion. Ultrasound guided biopsy is recommended.  3. Isoechoic 8 mm mass in the right breast 10:00 5 cm from the nipple is also an intermediate suspicion for malignancy, and ultrasound guided biopsy is recommended.  4. Stereotactic guided biopsy for the highly suspicious calcifications in the left breast also recommended as detailed in the mammography report.        03/22/2017 Initial Biopsy    Diagnosis 03/22/17 Lymph node, needle/core biopsy, left axillary node -METASTATIC CARCINOMA, SEE COMMENT Microscopic Comment      03/22/2017 Receptors her2    Lymph node biopsy ER 95% positive, strong staining, PR 5% positive, weak staining, HER-2 negative, with HER2/CEP17 ratio 1.23 and copy #3.95.      03/27/2017 Pathology Results    Diagnosis 03/27/17 1. Breast, left, needle core biopsy -FIBROADENOMATOID NODULE WITH CALCIFICATIONS 2.Breast, left, needle core biopsy -DUCTAL CARCINOMA IN SITU, HIGH GRADE -SEE COMMENT  3. Breast, right, needle, core biopsy -FIBROADENOMA -NO MALIGNANCY IDENTIFIED       03/29/2017 Initial Diagnosis    Breast cancer metastasized to axillary lymph node, left ([place])      04/07/2017 Imaging    CT CAP 04/07/17 IMPRESSION: 1. Several prominent left axillary lymph nodes which may correspond to biopsy proven axillary lymph node metastasis. 2. No thoracic adenopathy identified or evidence of distant metastatic disease.      04/07/2017 Imaging    Bone whole body scan 04/07/17 IMPRESSION: Negative for evidence of  osseous metastatic disease.      04/17/2017 Genetic Testing    BRCA2 c.[id number] (p.Glu260Serfs*15) pathogenic mutation identified on the 9 gene STAT panel.  The STAT Breast cancer panel offered by [facility] includes sequencing and rearrangement  analysis for the following 9 genes:  ATM, BRCA1, BRCA2, CDH1, CHEK2, PALB2, PTEN, STK11 and TP53.   The report date is April 17, 2017.  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  The Hereditary Gene Panel offered by [facility] includes sequencing and/or deletion duplication testing of the following 46 genes: APC, ATM, AXIN2, BARD1, BMPR1A, BRCA1, BRCA2, BRIP1, CDH1, CDKN2A (p14ARF), CDKN2A (p16INK4a), CHEK2, CTNNA1, DICER1, EPCAM (Deletion/duplication testing only), GREM1 (promoter region deletion/duplication testing only), KIT, MEN1, MLH1, MSH2, MSH3, MSH6, MUTYH, NBN, NF1, NHTL1, PALB2, PDGFRA, PMS2, POLD1, POLE, PTEN, RAD50, RAD51C, RAD51D, SDHB, SDHC, SDHD, SMAD4, SMARCA4. STK11, TP53, TSC1, TSC2, and VHL.  The following genes were evaluated for sequence changes only: SDHA and HOXB13 c.251G>A variant only.  The report date is April 29, 2017.       04/27/2017 Surgery    Surgey 04/27/17 BILATERAL SKIN SPARING MASTECTOMY and LEFT AXILLARY LYMPH NODE and BILATERAL BREAST RECONSTRUCTION WITH PLACEMENT OF TISSUE EXPANDER AND FLEX HD (ACELLULAR HYDRATED DERMIS) Bilateral BY Dr. Barry Dienes and Dr. Marla Roe       04/27/2017 Pathology Results     Diagnosis  04/27/17 1. Breast, simple mastectomy, Right - FIBROCYSTIC CHANGES WITH ADENOSIS. - USUAL DUCTAL HYPERPLASIA. - HEALING BIOPSY SITE. - THERE IS NO EVIDENCE OF MALIGNANCY. 2. Breast, simple mastectomy, Left - INVASIVE DUCTAL CARCINOMA, GRADE II/III, SPANNING 1.2 CM. - DUCTAL CARCINOMA IN SITU WITH CALCIFICATIONS, HIGH GRADE. - INVASIVE DUCTAL CARCINOMA IS FOCALLY PRESENT AT THE ANTERIOR MARGIN. - DUCTAL CARCINOMA IN SITU IS BROADLY 0.1 CM TO THE POSTERIOR MARGIN. - SEE ONCOLOGY TABLE BELOW. 3. Lymph nodes, regional resection, Left axillary - METASTATIC CARCINOMA IN 8 OF 21 LYMPH NODES (8/21). - SEE COMMENT      06/09/2017 -  Chemotherapy     Adriamycin and Cytoxan every 2 weeks, for 4 cycles 06/09/17-07/21/17, followed by weekly Abraxane and carboplatin for 12 weeks starting 08/11/17. Held Carbo with cycle 2 due to poor toleration. After cycle 2 may change Abraxane to Taxol as her insurance has denied Abraxane. Changed to Apraxane for Cycle 3 on 09/08/17        HISTORY OF PRESENTING ILLNESS: 03/29/17 Madison Savage 47 y.o. female is here because of newly diagnosed Breast cancer metastasized to axillary lymph node, left. She presents to Breast Clinic today with her husband. She felt the lump herself June 22nd. It was soft and "squishy" and it moved. First in the left breast and then four days latter in the left axilla. She feels they have gotten smaller.   In the past she was diagnosed with being ITP positive. Period has been irregular more since her last miscarriage in 2015. She has MTHFR gene. In pregnancy her plt counts have gone low. She has not seen homologist for this. All 3 children were vaginal births.  Today she reports that with the lumps she feels a dull ache that comes and goes. She has not noticed any nipple or skin change. She had not had a mammogram since she was 5. Her PCP was going to set her up with a mammogram last year. Dr. Ronita Hipps is her OB and Dr. Sherren Mocha is her current PCP. She feels no change in appetite or weight loss.  She quit smoking 3 weeks ago. She works out Scientist, product/process development. She reports her last attempt at the MRI, the contrast made her feel like she could not breathe, her head was pounding and she felt hot all over and she was shaking. The trouble breathing only lasted while she was in the MRI.  Husband shared concern with insurance and financial. And referrals to second opinions.    GYN HISTORY  Menarchal: 16 LMP: 03/05/2017 Contraceptive: none HRT: N/A G7P3: - 3 miscarriages, 1 abortion    CURRENT THERAPY: Adriamycin and Cytoxan every 2 weeks, for 4 cycles 06/09/17-07/21/17, followed by weekly Abraxane and  carboplatin for 12 weeks starting 08/11/17. Held Carbo with cycle 2 due to poor toleration, after cycle 2 may change Abraxane to Taxol as her insurance has denied Abraxane. Changed to Apraxane for Cycle 3 on 09/08/17  INTERVAL HISTORY:  Madison Savage is here for a follow up. She is doing well overall and reports that she tolerated her chemotherapy well after cycle 5. She states her cough is resolved but she is starting to have some sinus pressure in her head and some headaches. She hasn't taken any medication for this. She is continuing to use the cold cap during chemo and 4 hours afterwards. She notes she is having no numbness/tingling in her hands or feet.   She is going to schedule breast reconstruction in April 2019. She will start radiation this summer and follow with a hysterectomy in the fall.   On review of systems, pt denies nausea, increased fatigue or any other complaints at this time. Pertinent positives are listed and detailed within the above HPI.    MEDICAL HISTORY:  Past Medical History:  Diagnosis Date  . Anemia   . Anxiety   . Cancer ([place]) 03/2017   left breast cancer  . Chronic ITP (idiopathic thrombocytopenia) ([facility])   . Family history of breast cancer   . Family history of ovarian cancer   . Headache    last migraine...9/24  . Heart murmur    at birth.  none now  . MTHFR mutation [facility])     SURGICAL HISTORY: Past Surgical History:  Procedure Laterality Date  . AXILLARY LYMPH NODE DISSECTION Left 04/27/2017   Procedure: AXILLARY LYMPH NODE DISSECTION;  Surgeon: [name], MD;  Location: [place];  Service: General;  Laterality: Left;  . BILATERAL TOTAL MASTECTOMY WITH AXILLARY LYMPH NODE DISSECTION Bilateral 04/27/2017   Procedure: SKIN SPARING MASTECTOMY;  Surgeon: [name], MD;  Location: [place];  Service: General;  Laterality: Bilateral;  . BREAST RECONSTRUCTION WITH PLACEMENT OF TISSUE EXPANDER AND FLEX HD (ACELLULAR  HYDRATED DERMIS) Bilateral 04/27/2017   Procedure: BILATERAL BREAST RECONSTRUCTION WITH PLACEMENT OF TISSUE EXPANDER AND FLEX HD (ACELLULAR HYDRATED DERMIS);  Surgeon: Wallace Going, DO;  Location: [place];  Service: Plastics;  Laterality: Bilateral;  . BREAST SURGERY    . MASTECTOMY    . PORTACATH PLACEMENT Right 06/01/2017   Procedure: INSERTION PORT-A-CATH RIGHT SUBCLAVIAN;  Surgeon: [name], MD;  Location: [place];  Service: General;  Laterality: Right;  . WISDOM TOOTH EXTRACTION      SOCIAL HISTORY: Social History   Socioeconomic History  . Marital status: Married    Spouse name: Not on file  . Number of children: 3  . Years of education: Not on file  . Highest education level: Not on file  Social Needs  . Financial resource strain: Not on file  . Food insecurity -  worry: Not on file  . Food insecurity - inability: Not on file  . Transportation needs - medical: Not on file  . Transportation needs - non-medical: Not on file  Occupational History    Employer: UNEMPLOYED  Tobacco Use  . Smoking status: Former Smoker    Packs/day: 0.50    Years: 20.00    Pack years: 10.00    Types: Cigarettes    Last attempt to quit: 03/05/2017    Years since quitting: 0.5  . Smokeless tobacco: Never Used  Substance and Sexual Activity  . Alcohol use: No  . Drug use: No  . Sexual activity: Yes    Partners: Male    Birth control/protection: None  Other Topics Concern  . Not on file  Social History Narrative   Stay at home mom   3 children ages 59, 21, 8    FAMILY HISTORY: Family History  Problem Relation Age of Onset  . Breast cancer Paternal Aunt 49  . Ovarian cancer Paternal Aunt 70  . Cancer Paternal Grandmother        breast cancer   . Cancer Maternal Aunt        lymphoma   . Cancer Paternal Grandfather        throat cancer   . Cancer Paternal Aunt        lung cancer  . Cirrhosis Father   . AAA (abdominal aortic aneurysm) Maternal Grandfather    . Leukemia Paternal Aunt     ALLERGIES:  is allergic to clindamycin; penicillins; and codeine.  MEDICATIONS:  Current Outpatient Medications  Medication Sig Dispense Refill  . b complex vitamins capsule Take 1 capsule by mouth daily.    . Multiple Vitamins-Calcium (ONE-A-DAY WOMENS PO) Take 1 tablet by mouth daily.    Marland Kitchen OVER THE COUNTER MEDICATION Take 1 capsule by mouth daily. Biotin/MSM    . prochlorperazine (COMPAZINE) 10 MG tablet Take 1 tablet (10 mg total) by mouth every 6 (six) hours as needed for nausea or vomiting. 30 tablet 1  . diazepam (VALIUM) 2 MG tablet Take 1 tablet (2 mg total) by mouth every 6 (six) hours as needed for anxiety or muscle spasms. 30 tablet 0  . traMADol (ULTRAM) 50 MG tablet Take 1 tablet (50 mg total) by mouth every 6 (six) hours as needed. (Patient not taking: Reported on 10/06/2017) 15 tablet 1   Current Facility-Administered Medications  Medication Dose Route Frequency Provider Last Rate Last Dose  . LORazepam (ATIVAN) injection 0.5 mg  0.5 mg Intravenous Once [name], MD       Facility-Administered Medications Ordered in Other Visits  Medication Dose Route Frequency Provider Last Rate Last Dose  . sodium chloride flush (NS) 0.9 % injection 10 mL  10 mL Intravenous PRN [name], MD   10 mL at 07/21/17 1323    REVIEW OF SYSTEMS:   Constitutional: Denies fevers, chills or abnormal night sweats  Eyes: Denies blurriness of vision, double vision or watery eyes Ears, nose, mouth, throat, and face: Denies mucositis or sore throat  Respiratory: Denies dyspnea or wheezes  Cardiovascular: Denies palpitation, chest discomfort or lower extremity swelling Gastrointestinal:  Denies nausea, heartburn or change in bowel habits Skin: Denies abnormal skin rashes (+) palpable boil in right axilla  Lymphatics: Denies new lymphadenopathy or easy bruising Breast: (+) pinching in chest post surgery Neurological:Denies numbness, tingling or new  weaknesses Behavioral/Psych: Mood is stable, no new changes  All other systems were reviewed with the patient and  are negative.   PHYSICAL EXAMINATION: ECOG PERFORMANCE STATUS: 0 - Asymptomatic  Vitals:   10/06/17 0951  BP: 106/70  Pulse: 84  Resp: 19  Temp: 98.4 F (36.9 C)  SpO2: 99%   Filed Weights   10/06/17 0951  Weight: 127 lb 6.4 oz (57.8 kg)     GENERAL:alert, no distress and comfortable SKIN: skin color, texture, turgor are normal, no rashes or significant lesions (+) a very small boil in right axilla  EYES: normal, conjunctiva are pink and non-injected, sclera clear OROPHARYNX:no exudate, no erythema and lips, buccal mucosa, and tongue normal  NECK: supple, thyroid normal size, non-tender, without nodularity LYMPH:  no palpable lymphadenopathy in the cervical, axillary or inguinal LUNGS: clear to auscultation and percussion with normal breathing effort HEART: regular rate & rhythm and no murmurs and no lower extremity edema ABDOMEN:abdomen soft, non-tender and normal bowel sounds Musculoskeletal:no cyanosis of digits and no clubbing  PSYCH: alert & oriented x 3 with fluent speech NEURO: no focal motor/sensory deficits Breast: Status post bilateral mastectomy and tissue expander placements, surgical incisions have healed well, no discharge or skin erythema.   LABORATORY DATA:  I have reviewed the data as listed CBC Latest Ref Rng & Units 10/06/2017 09/29/2017 09/15/2017  WBC 3.9 - 10.3 K/uL 3.9 3.1(L) 3.4(L)  Hemoglobin 11.6 - 15.9 g/dL 12.2 12.4 11.9  Hematocrit 34.8 - 46.6 % 36.4 37.1 35.7  Platelets 145 - 400 K/uL 177 213 162    CMP Latest Ref Rng & Units 09/29/2017 09/15/2017 09/08/2017  Glucose 70 - 140 mg/dL 100 96 99  BUN 7 - 26 mg/dL 13 14 13.6  Creatinine 0.60 - 1.10 mg/dL 0.89 0.78 0.8  Sodium 136 - 145 mmol/L 140 141 139  Potassium 3.3 - 4.7 mmol/L 4.1 4.0 4.0  Chloride 98 - 109 mmol/L 106 110(H) -  CO2 22 - 29 mmol/L '25 25 26  '$ Calcium 8.4 - 10.4 mg/dL  9.0 8.7 9.1  Total Protein 6.4 - 8.3 g/dL 6.1(L) 6.0(L) 6.3(L)  Total Bilirubin 0.2 - 1.2 mg/dL 0.3 <0.2(L) 0.30  Alkaline Phos 40 - 150 U/L 74 71 75  AST 5 - 34 U/L '13 12 14  '$ ALT 0 - 55 U/L '12 15 14   '$ PATHOLOGY   Diagnosis  04/27/17 1. Breast, simple mastectomy, Right - FIBROCYSTIC CHANGES WITH ADENOSIS. - USUAL DUCTAL HYPERPLASIA. - HEALING BIOPSY SITE. - THERE IS NO EVIDENCE OF MALIGNANCY. 2. Breast, simple mastectomy, Left - INVASIVE DUCTAL CARCINOMA, GRADE II/III, SPANNING 1.2 CM. - DUCTAL CARCINOMA IN SITU WITH CALCIFICATIONS, HIGH GRADE. - INVASIVE DUCTAL CARCINOMA IS FOCALLY PRESENT AT THE ANTERIOR MARGIN. - DUCTAL CARCINOMA IN SITU IS BROADLY 0.1 CM TO THE POSTERIOR MARGIN. - SEE ONCOLOGY TABLE BELOW. 3. Lymph nodes, regional resection, Left axillary - METASTATIC CARCINOMA IN 8 OF 21 LYMPH NODES (8/21). - SEE COMMENT. Microscopic Comment 2. BREAST, INVASIVE TUMOR Procedure: Simple mastectomy and axillary lymph node resections. Laterality: Left. Tumor Size: 1.2 cm (glass slide measurement). Histologic Type: Ductal Grade: II Tubular Differentiation: 3 Nuclear Pleomorphism: 3 Mitotic Count: 1 Ductal Carcinoma in Situ (DCIS): Present, high grade, extensive. Extent of Tumor: Confined to breast parenchyma. Margins: Invasive carcinoma, distance from closest margin: Focally present at the anterior margin. DCIS, distance from closest margin: Broadly less than 0.1 cm to the posterior margin. Regional Lymph Nodes: Number of Lymph Nodes Examined: 21 1 of 4 FINAL for Madison Savage, Madison Savage [phone]) Microscopic Comment(continued) Lymph Nodes with Macrometastases: 6 Lymph Nodes with Micrometastases: 0 Lymph Nodes  with Isolated Tumor Cells: 2 Breast Prognostic Profile: [phone] Estrogen Receptor: 95%, strong. Progesterone Receptor: 5%, strong. Her2: No amplification was detected. The ratio is 1.23. Ki-67: 15%. Best tumor block for sendout testing: 2G Pathologic  Stage Classification (pTNM, AJCC 8th Edition): Primary Tumor (pT): pT1c Regional Lymph Nodes (pN): pN2a Distant Metastases (pM): pMX Comments: Grossly, there is a 2.5 cm focus of indurated tissue identified. Histologic examination of this area reveals predominately high grade ductal carcinoma in situ with calcifications. There is associated invasive ductal carcinoma, which is E-cadherin positive, spanning 1.2 cm. (JBK:gt, 05/02/17) 3. Immunohistochemical stains for cytokeratin AE1/AE3 performed on multiple blocks on part 3 help highlight the presence of metastatic carcinoma. (JBK:gt, 05/02/17)   Diagnosis 03/27/17 1. Breast, left, needle core biopsy -FIBROADENOMATOID NODULE WITH CALCIFICATIONS 2.Breast, left, needle core biopsy -DUCTAL CARCINOMA IN SITU, HIGH GRADE -SEE COMMENT  3. Breast, right, needle, core biopsy -FIBROADENOMA -NO MALIGNANCY IDENTIFIED  Diagnosis 03/22/17 Lymph node, needle/core biopsy, left axillary node -METASTATIC CARCINOMA, SEE COMMENT Microscopic Comment Sections showed lymph node tissue mostly replaced by metastatic carcinoma characterized by sheets of epithelioid appearing cells displaying milder to moderate nuclear pleomorphism, fine chromatin, small nucleoli, and moderately abundant amphophilic to eosinophilic cyto-plasma. Immunohistochemistry staining showed positive for cytokeratin AE1/AE3, GATA-3 and estrogen receptor, with patchy positivity for cytokeratin 7 and GCDFP., Only scattered cells showed progesterone positivity. Negative for S100, melan-a, E-cadherin, cytokeratin 20, CDX-2, TTF-1 or WT-1. The findings are consistent with metastatic carcinoma of breast primary. Lack of significant e-cadherin positivity suggested lobular carcinoma.   GENETICS 04/10/17    PROCEDURES   ECHO 05/31/17 Study Conclusions - Left ventricle: The cavity size was normal. Wall thickness was   normal. Systolic function was normal. The estimated ejection   fraction was in  the range of 55% to 60%. Wall motion was normal;   there were no regional wall motion abnormalities. Impressions: - Normal LV systolic and diastolic function; global longitudinal   strain - 17.9%.  RADIOGRAPHIC STUDIES: I have personally reviewed the radiological images as listed and agreed with the findings in the report.  CT CAP 04/07/17 IMPRESSION: 1. Several prominent left axillary lymph nodes which may correspond to biopsy proven axillary lymph node metastasis. 2. No thoracic adenopathy identified or evidence of distant metastatic disease.   Bone whole body scan 04/07/17 IMPRESSION: Negative for evidence of osseous metastatic disease.  Diagnostic mammogram bilateral 03/21/17 IMPRESSION: INCOMPLETE - ADDITIONAL IMAGING EVALUATION NEEDED 1. The 0.6 cm oval mass in the right breast at 11 o'clock middle depth is indeterminate. An ultrasound is recommended.  2. The 0.5cm round focal asymmetry in the left breast central to the nipple middle depth is indeterminate. An ultrasound is recommended.  3. Ultrasound of the palpable abnormalities in the axilla and far left lateral breast is recommended.  4. Multiple clusters of pleomorphic calcifications in the left upper outer breast anterior depth spanning a 2.5 cm area are highly suggestive of malignancy. A stereotactic biopsy is recommended.   US Breast bilateral 03/21/17 IMPRESSION: HIGHLY SUGGESTIVE OF MALIGNANCY 1. Six distinct abnormal left axillary nodes, 2 of which correspond to the palpable lumps felt by the patient. Findings concerning for metastatic adenopathy. Ultrasound guided biopsy of one of these nodes is recommended.  2. Small 5 mm palpable, oval mass in the left breast 3:00 areolar margin is at an intermediate suspicion. Ultrasound guided biopsy is recommended.  3. Isoechoic 8 mm mass in the right breast 10:00 5 cm from the nipple is also an intermediate suspicion for  malignancy, and ultrasound guided biopsy is recommended.  4.  Stereotactic guided biopsy for the highly suspicious calcifications in the left breast also recommended as detailed in the mammography report.    ASSESSMENT & PLAN:  SHANI FITCH is a 47 y.o. perimenopausal female with a history of Chronic ITP and MTHFR mutation, presented with a palpable left axillary adenopathy.  1. 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 She had a 1.2 cm left breast invasive ductal carcinoma, with 8 positive lymph nodes. Surgical margins were negative. -her staging CT and bone scan was negative -We discussed the risk of cancer recurrence after surgery. Due to her multiple positive lymph nodes, young age, she has high risk for recurrence.  -I recommended adjuvant chemotherapy to reduce her risk of recurrence. I recommend standard dose dense Adriamycin and Cytoxan every 2 weeks, for 4 cycles, followed by weekly Taxol or Abraxane for 12 weeks. Due to her BRCA2 mutation, and locally advanced disease, she probably would benefit from carboplatin also, I plan to add [place] to weekly Taxol. -Her Baseline 9/26/148 ECHO was normal -She started Adjuvant AC on 06/09/17, tolerating well so far, some fatigue, improves with IVF on week 2. Pt has been using Cold cap and it has been working for her thus far.  -She agreed to weekly Abraxane and Carboplatin and will continue Cold Cap. Due to worsened cold symptoms she started on 08/11/17.  -She did not tolerate the first cycle Carbo and Abraxane well. Per her request, I held [place] with cycle 2 and she tolerated abraxane alone very well -Her insurance has denied her Abraxane even after appeal.  Patient is very reluctant to try Taxol due to concern of side effects,  will continue Abraxane, will apply for free drug replacement for her. -Pt is doing well on Abraxane, no significant neuropathy or other side effects except mild fatigue, we will proceed with Cycle 4 today (09/15/17).  -I advised her to continue to watch for numbness/tingling in her finger tips and toes. -I discussed recovery time for breast reconstruction surgery 4 weeks after last chemotherapy (12/2017). She will begin 6 weeks of radiation with Dr. Lisbeth Renshaw following surgery. Pt is also planning to have hysterectomy in the fall. Pt has questions about hormone replacement therapy following hysterectomy. I advised her that she will not do this given her ER+ PR+ disease. Sh will start Tamoxifen when she completes chemo.  -I discussed cancer surveillance after she completes adjuvant therapy. -Labs reviewed today (10/06/17) are WNL. Pt can continue with cycle 6 of Abraxane today  -We dicussed survivorship clinic after completion of treatment. She is very interested.   2. Chronic ITP  -Discovered in her Last pregnancy, with platelet count dropped to between 50,000 and 60,000, recovered, her platelet counts is around 120-150K lately  -She does not follow up with hematologist  -I'm concerned her platelet counts will drop significantly with chemotherapy, she may need steroids or additional treatment for her ITP. -Her platelets have overall remained stable while on chemotherapy.   3. 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 -She has had bilateral mastectomy, due to the high-risk of breast cancer -She planned to have hysterectomy and BSO after she completes  chemo, radiation and breast reconstruction -Patient has met our genetic counselor Mrs. Florene Glen, BRCA2 G mutation screening in her children and siblings were recommended.    4. Chest pain, secondary to [facility] -Managed with Norco as needed, much better now  -Manageable, continue monitoring    PLAN:  -Continue weekly Abraxane cycle 6 today and continue weekly -F/u in 2 weeks with lab   No orders of the defined types were placed in this encounter.   All questions were answered. The patient knows to call the clinic with any problems, questions or concerns. I spent 15 minutes counseling the patient face to face. The total time spent in the appointment was 20 minutes and more than 50% was on counseling.  This document serves as a record of services personally performed by [name], MD. It was created on her behalf by Theresia Bough, a trained medical scribe. The creation of this record is based on the scribe's personal observations and the provider's statements to them.   I have reviewed the above documentation for accuracy and completeness, and I agree with the above.    [name], MD 10/06/2017 10:26 AM

## 2017-10-06 ENCOUNTER — Inpatient Hospital Stay: Payer: BLUE CROSS/BLUE SHIELD

## 2017-10-06 ENCOUNTER — Inpatient Hospital Stay: Payer: BLUE CROSS/BLUE SHIELD | Attending: Hematology

## 2017-10-06 ENCOUNTER — Inpatient Hospital Stay (HOSPITAL_BASED_OUTPATIENT_CLINIC_OR_DEPARTMENT_OTHER): Payer: BLUE CROSS/BLUE SHIELD | Admitting: Hematology

## 2017-10-06 VITALS — BP 106/70 | HR 84 | Temp 98.4°F | Resp 19 | Ht 67.5 in | Wt 127.4 lb

## 2017-10-06 DIAGNOSIS — D693 Immune thrombocytopenic purpura: Secondary | ICD-10-CM | POA: Insufficient documentation

## 2017-10-06 DIAGNOSIS — C773 Secondary and unspecified malignant neoplasm of axilla and upper limb lymph nodes: Secondary | ICD-10-CM | POA: Diagnosis not present

## 2017-10-06 DIAGNOSIS — C50912 Malignant neoplasm of unspecified site of left female breast: Secondary | ICD-10-CM

## 2017-10-06 DIAGNOSIS — C50412 Malignant neoplasm of upper-outer quadrant of left female breast: Secondary | ICD-10-CM | POA: Diagnosis not present

## 2017-10-06 DIAGNOSIS — Z5111 Encounter for antineoplastic chemotherapy: Secondary | ICD-10-CM | POA: Diagnosis not present

## 2017-10-06 DIAGNOSIS — Z1509 Genetic susceptibility to other malignant neoplasm: Secondary | ICD-10-CM | POA: Diagnosis not present

## 2017-10-06 DIAGNOSIS — Z1501 Genetic susceptibility to malignant neoplasm of breast: Secondary | ICD-10-CM

## 2017-10-06 DIAGNOSIS — G4700 Insomnia, unspecified: Secondary | ICD-10-CM | POA: Insufficient documentation

## 2017-10-06 DIAGNOSIS — R5383 Other fatigue: Secondary | ICD-10-CM | POA: Insufficient documentation

## 2017-10-06 DIAGNOSIS — R079 Chest pain, unspecified: Secondary | ICD-10-CM | POA: Diagnosis not present

## 2017-10-06 DIAGNOSIS — Z95828 Presence of other vascular implants and grafts: Secondary | ICD-10-CM

## 2017-10-06 LAB — CBC WITH DIFFERENTIAL/PLATELET
Basophils Absolute: 0 10*3/uL (ref 0.0–0.1)
Basophils Relative: 1 %
Eosinophils Absolute: 0 10*3/uL (ref 0.0–0.5)
Eosinophils Relative: 1 %
HCT: 36.4 % (ref 34.8–46.6)
Hemoglobin: 12.2 g/dL (ref 11.6–15.9)
Lymphocytes Relative: 23 %
Lymphs Abs: 0.9 10*3/uL (ref 0.9–3.3)
MCH: 31.8 pg (ref 25.1–34.0)
MCHC: 33.7 g/dL (ref 31.5–36.0)
MCV: 94.3 fL (ref 79.5–101.0)
Monocytes Absolute: 0.3 10*3/uL (ref 0.1–0.9)
Monocytes Relative: 7 %
Neutro Abs: 2.7 10*3/uL (ref 1.5–6.5)
Neutrophils Relative %: 68 %
Platelets: 177 10*3/uL (ref 145–400)
RBC: 3.86 MIL/uL (ref 3.70–5.45)
RDW: 13.5 % (ref 11.2–14.5)
WBC: 3.9 10*3/uL (ref 3.9–10.3)

## 2017-10-06 LAB — COMPREHENSIVE METABOLIC PANEL
ALT: 14 U/L (ref 0–55)
AST: 15 U/L (ref 5–34)
Albumin: 3.9 g/dL (ref 3.5–5.0)
Alkaline Phosphatase: 71 U/L (ref 40–150)
Anion gap: 6 (ref 3–11)
BUN: 17 mg/dL (ref 7–26)
CO2: 27 mmol/L (ref 22–29)
Calcium: 9.7 mg/dL (ref 8.4–10.4)
Chloride: 107 mmol/L (ref 98–109)
Creatinine, Ser: 0.78 mg/dL (ref 0.60–1.10)
GFR calc Af Amer: >60 mL/min (ref >60)
GFR calc non Af Amer: >60 mL/min (ref >60)
Glucose, Bld: 97 mg/dL (ref 70–140)
Potassium: 4.2 mmol/L (ref 3.5–5.1)
Sodium: 140 mmol/L (ref 136–145)
Total Bilirubin: <0.2 mg/dL — ABNORMAL LOW (ref 0.2–1.2)
Total Protein: 6.4 g/dL (ref 6.4–8.3)

## 2017-10-06 MED ORDER — HEPARIN SOD (PORK) LOCK FLUSH 100 UNIT/ML IV SOLN
500.0000 [IU] | Freq: Once | INTRAVENOUS | Status: AC | PRN
  Administered 2017-10-06: 500 [IU]
  Filled 2017-10-06: qty 5

## 2017-10-06 MED ORDER — SODIUM CHLORIDE 0.9% FLUSH
10.0000 mL | Freq: Once | INTRAVENOUS | Status: AC
  Administered 2017-10-06: 10 mL
  Filled 2017-10-06: qty 10

## 2017-10-06 MED ORDER — SODIUM CHLORIDE 0.9 % IV SOLN
Freq: Once | INTRAVENOUS | Status: AC
  Administered 2017-10-06: 11:00:00 via INTRAVENOUS

## 2017-10-06 MED ORDER — PACLITAXEL PROTEIN-BOUND CHEMO INJECTION 100 MG
100.0000 mg/m2 | Freq: Once | INTRAVENOUS | Status: AC
  Administered 2017-10-06: 150 mg via INTRAVENOUS
  Filled 2017-10-06: qty 30

## 2017-10-06 MED ORDER — PROCHLORPERAZINE MALEATE 10 MG PO TABS
ORAL_TABLET | ORAL | Status: AC
Start: 2017-10-06 — End: 2017-10-06
  Filled 2017-10-06: qty 1

## 2017-10-06 MED ORDER — PROCHLORPERAZINE MALEATE 10 MG PO TABS: 10.0000 mg | ORAL_TABLET | Freq: Four times a day (QID) | ORAL | Status: DC | PRN

## 2017-10-06 MED ORDER — SODIUM CHLORIDE 0.9% FLUSH
10.0000 mL | INTRAVENOUS | Status: DC | PRN
  Administered 2017-10-06: 10 mL
  Filled 2017-10-06: qty 10

## 2017-10-06 NOTE — Patient Instructions (Signed)
Cancer Center Discharge Instructions for Patients Receiving Chemotherapy   Today you received the following chemotherapy agents Abraxane.   To help prevent nausea and vomiting after your treatment, we encourage you to take your nausea medicationas prescribed.  If you develop nausea and vomiting that is not controlled by your nausea medication, call the clinic.   BELOW ARE SYMPTOMS THAT SHOULD BE REPORTED IMMEDIATELY:  *FEVER GREATER THAN 100.5 F  *CHILLS WITH OR WITHOUT FEVER  NAUSEA AND VOMITING THAT IS NOT CONTROLLED WITH YOUR NAUSEA MEDICATION  *UNUSUAL SHORTNESS OF BREATH  *UNUSUAL BRUISING OR BLEEDING  TENDERNESS IN MOUTH AND THROAT WITH OR WITHOUT PRESENCE OF ULCERS  *URINARY PROBLEMS  *BOWEL PROBLEMS  UNUSUAL RASH Items with * indicate a potential emergency and should be followed up as soon as possible.  Feel free to call the clinic should you have any questions or concerns. The clinic phone number is [phone].  Please show the CHEMO ALERT CARD at check-in to the Emergency Department and triage nurse.  

## 2017-10-07 ENCOUNTER — Encounter: Payer: Self-pay | Admitting: Hematology

## 2017-10-07 IMAGING — US US TRANSVAGINAL NON-OB
1 series · 14 of 25 positions shown · non-contrast
Comparison: Ultrasound on [DATE]

CLINICAL DATA: BRCA 2 mutation positive.  Fibroids.



[Series 1: us transvaginal non-ob · 0.20mm/px · 14 of 113 slices shown]
[im 1/113]
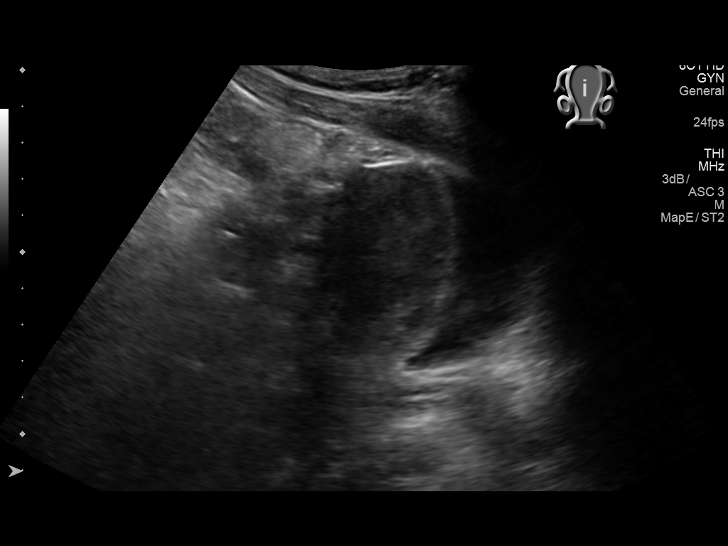
[im 10/113]
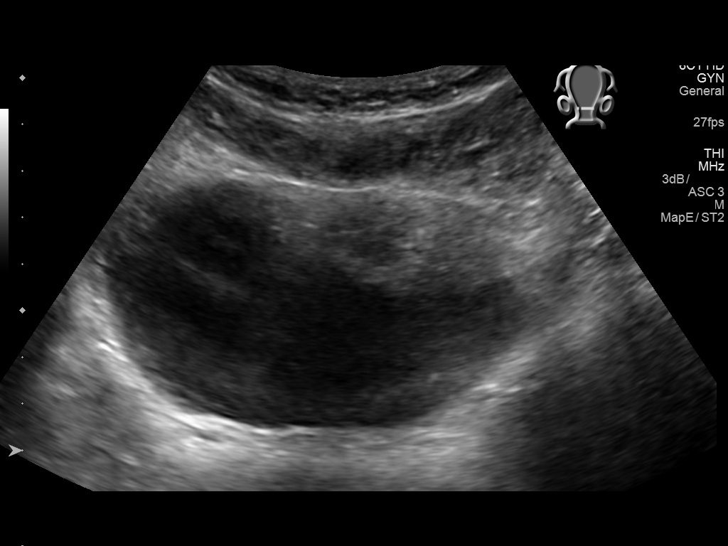
[im 19/113]
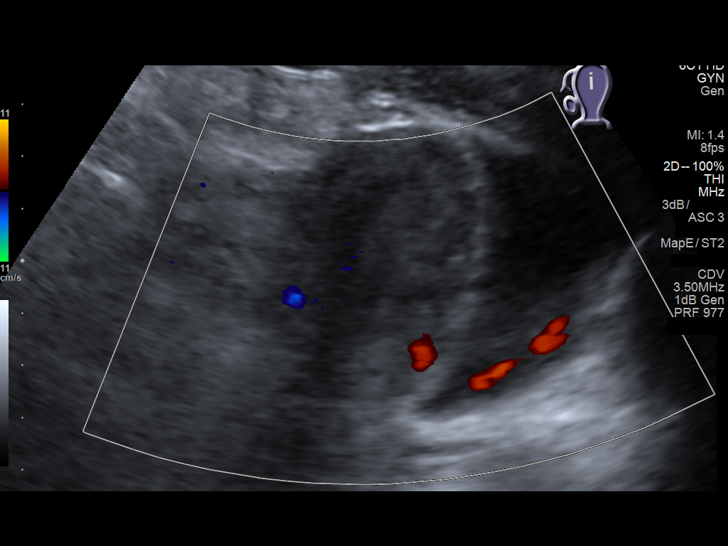
[im 29/113]
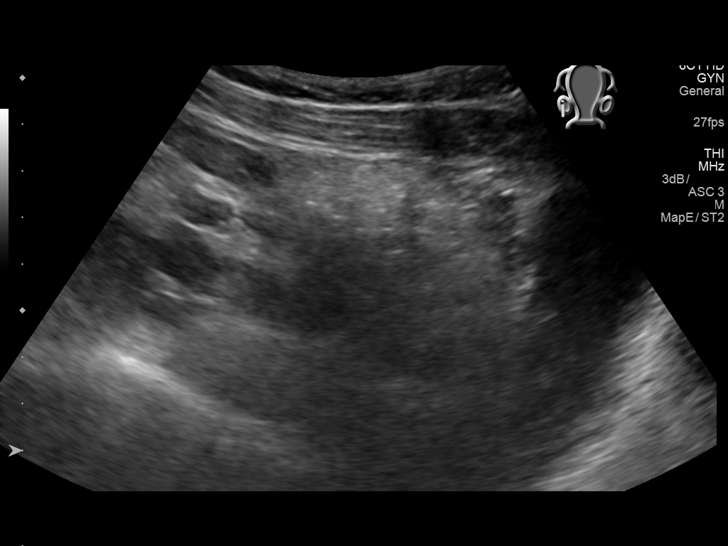
[im 38/113]
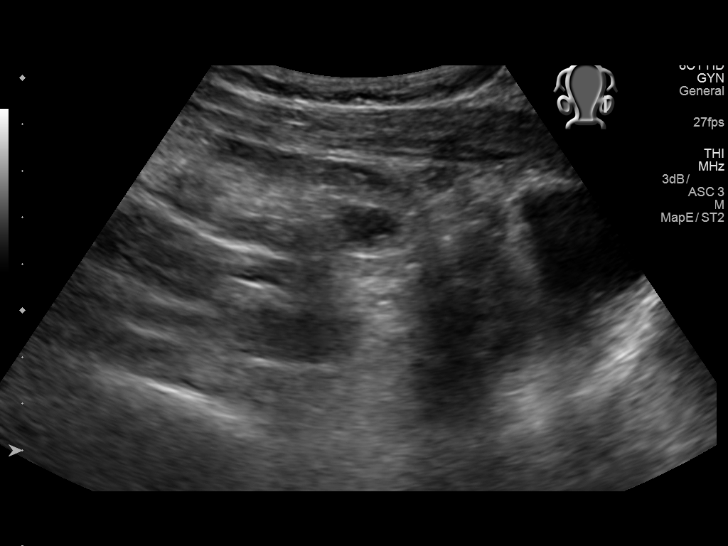
[im 43/113]
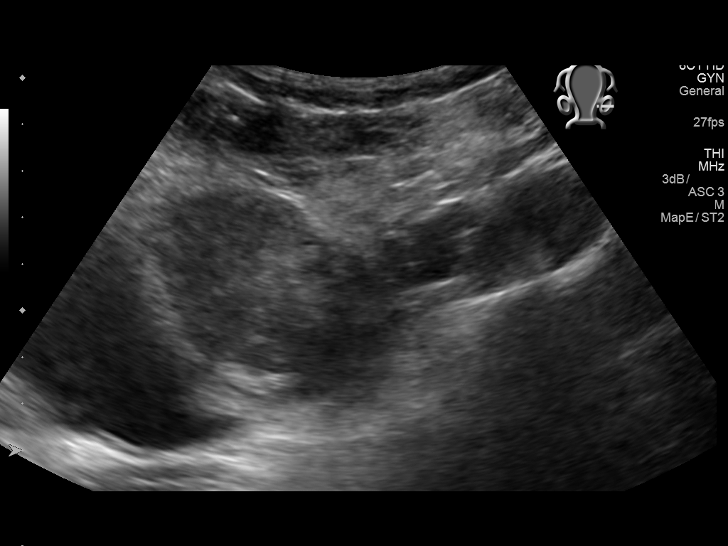
[im 52/113]
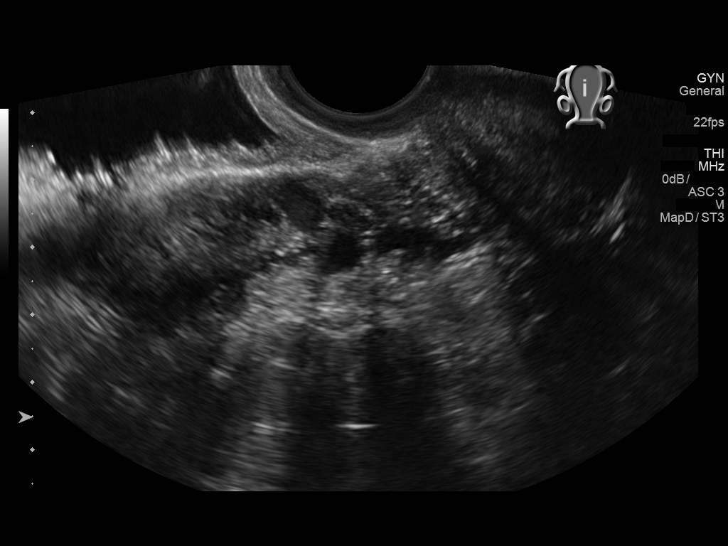
[im 61/113]
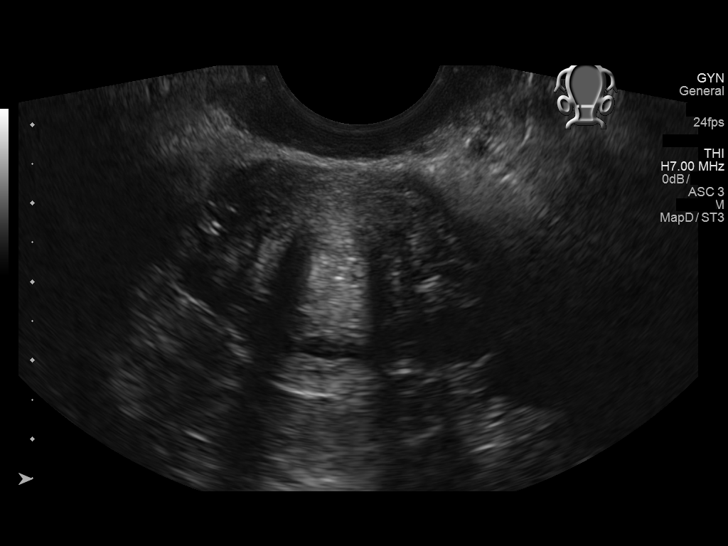
[im 71/113]
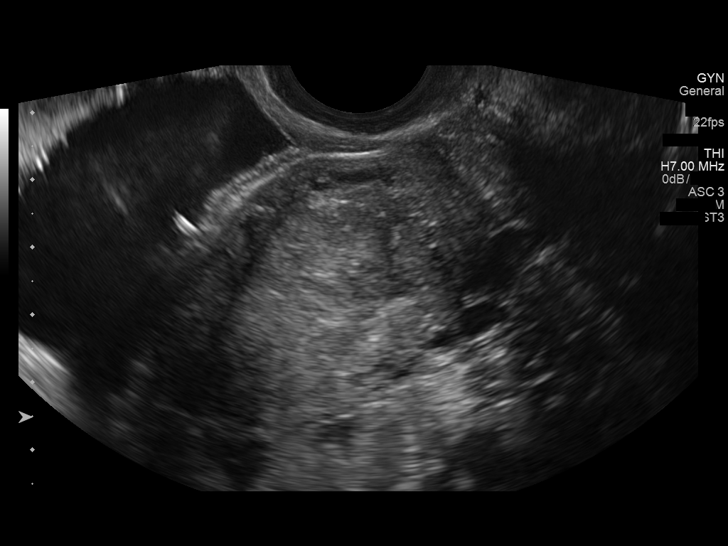
[im 75/113]
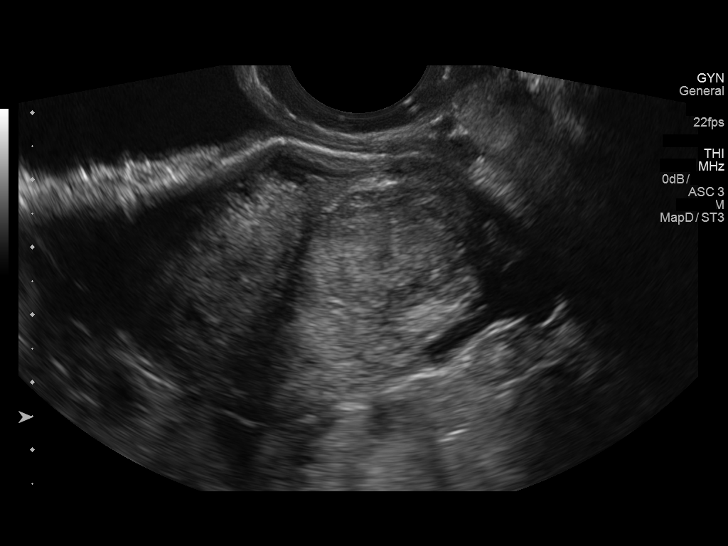
[im 85/113]
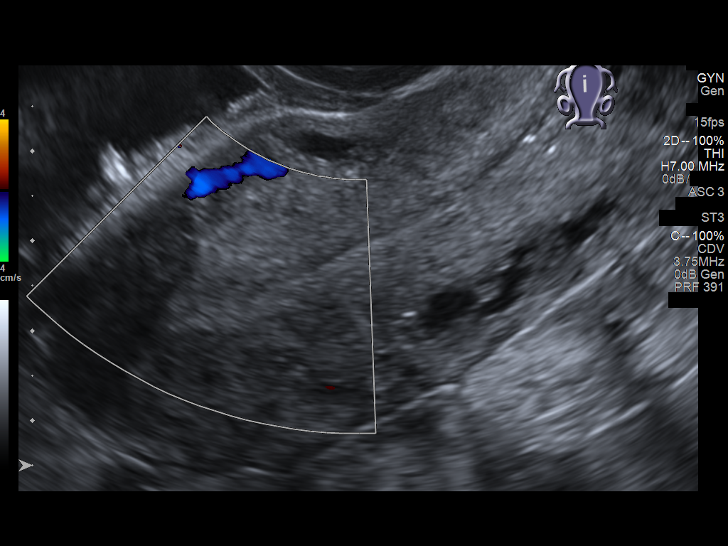
[im 94/113]
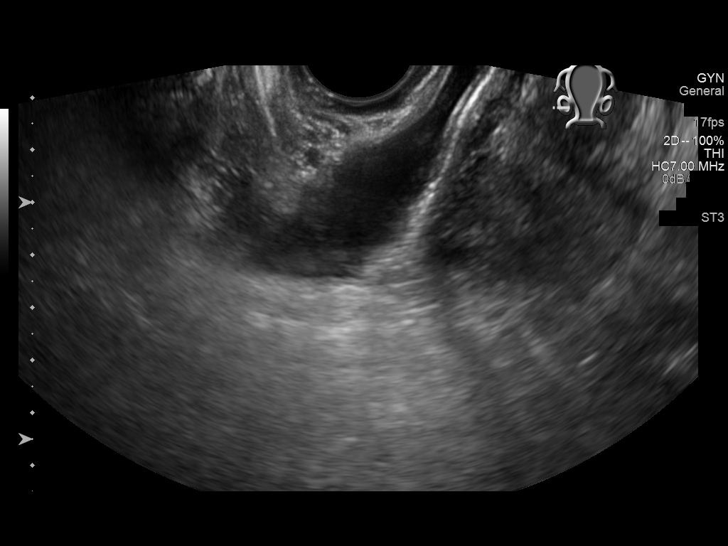
[im 103/113]
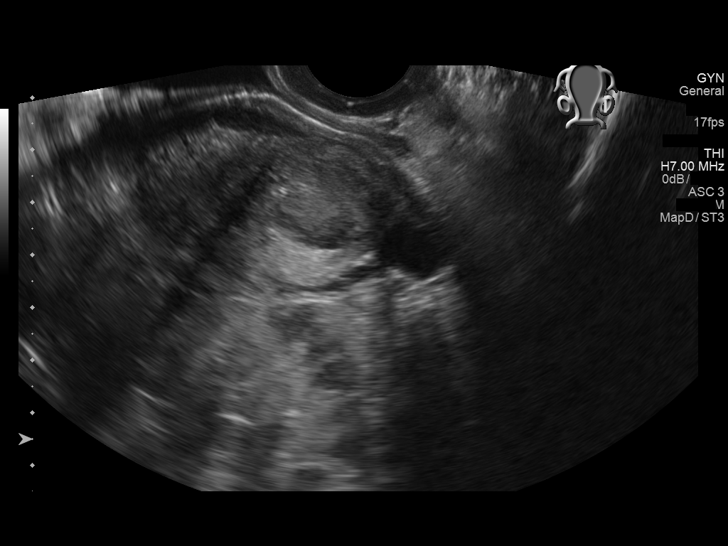
[im 113/113]
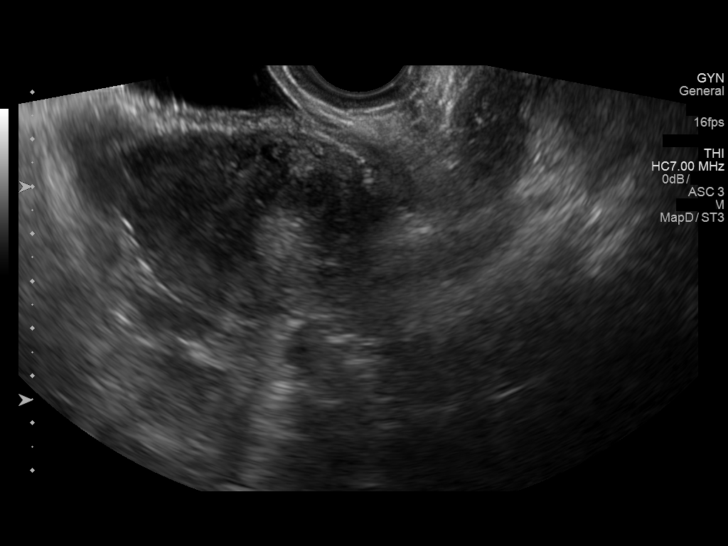

[14 of 25 positions shown; findings below may reference images not displayed]

FINDINGS: Uterus

Measurements: 8.5 x 3.4 by 5.2 cm. 2.3 cm intramural fibroid in the
left anterior corpus, and this is mildly increased in size from
cm on previous exam in [[facility]].

Endometrium

Thickness: 6 mm.  No focal abnormality visualized.

Right ovary

Measurements: Not visualized, however no adnexal mass identified.

Left ovary

Measurements: Not visualized, however no adnexal mass identified.

Other findings

No abnormal free fluid.
IMPRESSION: Nonvisualization of ovaries, however no adnexal mass identified.

2.3 cm left uterine fibroid, mildly increased in size since [RC]
study.

## 2017-10-12 ENCOUNTER — Inpatient Hospital Stay: Payer: BLUE CROSS/BLUE SHIELD

## 2017-10-12 VITALS — BP 100/56 | HR 83 | Temp 98.4°F | Resp 18

## 2017-10-12 DIAGNOSIS — Z1509 Genetic susceptibility to other malignant neoplasm: Secondary | ICD-10-CM | POA: Diagnosis not present

## 2017-10-12 DIAGNOSIS — D693 Immune thrombocytopenic purpura: Secondary | ICD-10-CM | POA: Diagnosis not present

## 2017-10-12 DIAGNOSIS — C773 Secondary and unspecified malignant neoplasm of axilla and upper limb lymph nodes: Secondary | ICD-10-CM | POA: Diagnosis not present

## 2017-10-12 DIAGNOSIS — Z5111 Encounter for antineoplastic chemotherapy: Secondary | ICD-10-CM | POA: Diagnosis not present

## 2017-10-12 DIAGNOSIS — C50912 Malignant neoplasm of unspecified site of left female breast: Secondary | ICD-10-CM

## 2017-10-12 DIAGNOSIS — G4700 Insomnia, unspecified: Secondary | ICD-10-CM | POA: Diagnosis not present

## 2017-10-12 DIAGNOSIS — C50412 Malignant neoplasm of upper-outer quadrant of left female breast: Secondary | ICD-10-CM | POA: Diagnosis not present

## 2017-10-12 DIAGNOSIS — R5383 Other fatigue: Secondary | ICD-10-CM | POA: Diagnosis not present

## 2017-10-12 DIAGNOSIS — R079 Chest pain, unspecified: Secondary | ICD-10-CM | POA: Diagnosis not present

## 2017-10-12 LAB — COMPREHENSIVE METABOLIC PANEL
ALT: 14 U/L (ref 0–55)
AST: 14 U/L (ref 5–34)
Albumin: 3.8 g/dL (ref 3.5–5.0)
Alkaline Phosphatase: 66 U/L (ref 40–150)
Anion gap: 8 (ref 3–11)
BUN: 15 mg/dL (ref 7–26)
CO2: 24 mmol/L (ref 22–29)
Calcium: 8.8 mg/dL (ref 8.4–10.4)
Chloride: 108 mmol/L (ref 98–109)
Creatinine, Ser: 0.76 mg/dL (ref 0.60–1.10)
GFR calc Af Amer: >60 mL/min (ref >60)
GFR calc non Af Amer: >60 mL/min (ref >60)
Glucose, Bld: 96 mg/dL (ref 70–140)
Potassium: 4.1 mmol/L (ref 3.5–5.1)
Sodium: 140 mmol/L (ref 136–145)
Total Bilirubin: 0.3 mg/dL (ref 0.2–1.2)
Total Protein: 6 g/dL — ABNORMAL LOW (ref 6.4–8.3)

## 2017-10-12 LAB — CBC WITH DIFFERENTIAL/PLATELET
Basophils Absolute: 0 10*3/uL (ref 0.0–0.1)
Basophils Relative: 0 %
Eosinophils Absolute: 0.1 10*3/uL (ref 0.0–0.5)
Eosinophils Relative: 2 %
HCT: 35.3 % (ref 34.8–46.6)
Hemoglobin: 12.1 g/dL (ref 11.6–15.9)
Lymphocytes Relative: 29 %
Lymphs Abs: 0.9 10*3/uL (ref 0.9–3.3)
MCH: 32.2 pg (ref 25.1–34.0)
MCHC: 34.3 g/dL (ref 31.5–36.0)
MCV: 93.9 fL (ref 79.5–101.0)
Monocytes Absolute: 0.2 10*3/uL (ref 0.1–0.9)
Monocytes Relative: 6 %
Neutro Abs: 2.1 10*3/uL (ref 1.5–6.5)
Neutrophils Relative %: 63 %
Platelets: 194 10*3/uL (ref 145–400)
RBC: 3.76 MIL/uL (ref 3.70–5.45)
RDW: 13 % (ref 11.2–14.5)
WBC: 3.3 10*3/uL — ABNORMAL LOW (ref 3.9–10.3)

## 2017-10-12 MED ORDER — SODIUM CHLORIDE 0.9 % IV SOLN
Freq: Once | INTRAVENOUS | Status: AC
  Administered 2017-10-12: 09:00:00 via INTRAVENOUS

## 2017-10-12 MED ORDER — PACLITAXEL PROTEIN-BOUND CHEMO INJECTION 100 MG
100.0000 mg/m2 | Freq: Once | INTRAVENOUS | Status: AC
  Administered 2017-10-12: 150 mg via INTRAVENOUS
  Filled 2017-10-12: qty 30

## 2017-10-12 MED ORDER — SODIUM CHLORIDE 0.9% FLUSH
10.0000 mL | INTRAVENOUS | Status: DC | PRN
  Administered 2017-10-12: 10 mL
  Filled 2017-10-12: qty 10

## 2017-10-12 MED ORDER — HEPARIN SOD (PORK) LOCK FLUSH 100 UNIT/ML IV SOLN
500.0000 [IU] | Freq: Once | INTRAVENOUS | Status: AC | PRN
  Administered 2017-10-12: 500 [IU]
  Filled 2017-10-12: qty 5

## 2017-10-12 MED ORDER — PROCHLORPERAZINE MALEATE 10 MG PO TABS: 10.0000 mg | ORAL_TABLET | Freq: Once | ORAL | Status: DC

## 2017-10-12 NOTE — Patient Instructions (Signed)
[facility] Discharge Instructions for Patients Receiving Chemotherapy  Today you received the following chemotherapy agents:  Abraxane.  To help prevent nausea and vomiting after your treatment, we encourage you to take your nausea medication as directed.   If you develop nausea and vomiting that is not controlled by your nausea medication, call the clinic.   BELOW ARE SYMPTOMS THAT SHOULD BE REPORTED IMMEDIATELY:  *FEVER GREATER THAN 100.5 F  *CHILLS WITH OR WITHOUT FEVER  NAUSEA AND VOMITING THAT IS NOT CONTROLLED WITH YOUR NAUSEA MEDICATION  *UNUSUAL SHORTNESS OF BREATH  *UNUSUAL BRUISING OR BLEEDING  TENDERNESS IN MOUTH AND THROAT WITH OR WITHOUT PRESENCE OF ULCERS  *URINARY PROBLEMS  *BOWEL PROBLEMS  UNUSUAL RASH Items with * indicate a potential emergency and should be followed up as soon as possible.  Feel free to call the clinic should you have any questions or concerns. The clinic phone number is [phone].  Please show the CHEMO ALERT CARD at check-in to the Emergency Department and triage nurse.   

## 2017-10-13 ENCOUNTER — Other Ambulatory Visit: Payer: BLUE CROSS/BLUE SHIELD

## 2017-10-13 ENCOUNTER — Ambulatory Visit: Payer: BLUE CROSS/BLUE SHIELD

## 2017-10-17 ENCOUNTER — Encounter: Payer: Self-pay | Admitting: Physical Therapy

## 2017-10-17 ENCOUNTER — Telehealth: Payer: Self-pay | Admitting: *Deleted

## 2017-10-17 NOTE — Telephone Encounter (Signed)
Pt called and c/o "stabbing pain behind my port, almost on the muscle" relate it doesn't happen all the time and that her port site isn't red or swollen, denies fever. Pt relate she did exercise twice last week and that pain started this weekend. Informed pt that I would reach out to Dr. Barry Dienes to discuss symptoms. Spoke to dr. Barry Dienes nurse regarding pt complaints.

## 2017-10-19 ENCOUNTER — Other Ambulatory Visit: Payer: Self-pay | Admitting: *Deleted

## 2017-10-19 DIAGNOSIS — C50912 Malignant neoplasm of unspecified site of left female breast: Secondary | ICD-10-CM

## 2017-10-19 DIAGNOSIS — C773 Secondary and unspecified malignant neoplasm of axilla and upper limb lymph nodes: Principal | ICD-10-CM

## 2017-10-19 NOTE — Progress Notes (Signed)
Madison Savage  [phone] [phone]  Clinic Follow up Note   Patient Care Team: Dorena Cookey, MD as PCP - Madison Citizen, MD as Consulting Physician (General Surgery) [name], MD as Consulting Physician (Hematology) Kyung Rudd, MD as Consulting Physician (Radiation Oncology) Brien Few, MD as Consulting Physician (Obstetrics and Gynecology)   Date of Service:  10/20/2017  CHIEF COMPLAINTS:  Follow up for Breast cancer metastasized to axillary lymph node, left   Oncology History   Cancer Staging Breast cancer metastasized to axillary lymph node, left [facility]) Staging form: Breast, AJCC 8th Edition - Clinical stage from 03/22/2017: Stage Unknown (cTX, cN1, cM0, GX, ER: Positive, PR: Positive, HER2: Negative) - Signed by [name], MD on 03/29/2017 - Pathologic stage from 04/27/2017: Stage IB (pT1c, pN2a, cM0, G2, ER: Positive, PR: Positive, HER2: Negative) - Signed by [name], MD on 05/25/2017 \     Breast cancer metastasized to axillary lymph node, left ([place])   03/21/2017 Mammogram    Diagnostic mammogram bilateral 03/21/17 IMPRESSION: INCOMPLETE - ADDITIONAL IMAGING EVALUATION NEEDED 1. The 0.6 cm oval mass in the right breast at 11 o'clock middle depth is indeterminate. An ultrasound is recommended.  2. The 0.5cm round focal asymmetry in the left breast central to the nipple middle depth is indeterminate. An ultrasound is recommended.  3. Ultrasound of the palpable abnormalities in the axilla and far left lateral breast is recommended.  4. Multiple clusters of pleomorphic calcifications in the left upper outer breast anterior depth spanning a 2.5 cm area are highly suggestive of malignancy. A stereotactic biopsy is recommended.        03/21/2017 Imaging    US Breast bilateral 03/21/17 IMPRESSION: HIGHLY SUGGESTIVE OF MALIGNANCY 1. Six distinct abnormal left axillary nodes, 2 of which correspond to the palpable lumps felt by the  patient. Findings concerning for metastatic adenopathy. Ultrasound guided biopsy of one of these nodes is recommended.  2. Small 5 mm palpable, oval mass in the left breast 3:00 areolar margin is at an intermediate suspicion. Ultrasound guided biopsy is recommended.  3. Isoechoic 8 mm mass in the right breast 10:00 5 cm from the nipple is also an intermediate suspicion for malignancy, and ultrasound guided biopsy is recommended.  4. Stereotactic guided biopsy for the highly suspicious calcifications in the left breast also recommended as detailed in the mammography report.        03/22/2017 Initial Biopsy    Diagnosis 03/22/17 Lymph node, needle/core biopsy, left axillary node -METASTATIC CARCINOMA, SEE COMMENT Microscopic Comment      03/22/2017 Receptors her2    Lymph node biopsy ER 95% positive, strong staining, PR 5% positive, weak staining, HER-2 negative, with HER2/CEP17 ratio 1.23 and copy #3.95.      03/27/2017 Pathology Results    Diagnosis 03/27/17 1. Breast, left, needle core biopsy -FIBROADENOMATOID NODULE WITH CALCIFICATIONS 2.Breast, left, needle core biopsy -DUCTAL CARCINOMA IN SITU, HIGH GRADE -SEE COMMENT  3. Breast, right, needle, core biopsy -FIBROADENOMA -NO MALIGNANCY IDENTIFIED       03/29/2017 Initial Diagnosis    Breast cancer metastasized to axillary lymph node, left ([place])      04/07/2017 Imaging    CT CAP 04/07/17 IMPRESSION: 1. Several prominent left axillary lymph nodes which may correspond to biopsy proven axillary lymph node metastasis. 2. No thoracic adenopathy identified or evidence of distant metastatic disease.      04/07/2017 Imaging    Bone whole body scan 04/07/17 IMPRESSION: Negative for evidence of  osseous metastatic disease.      04/17/2017 Genetic Testing    BRCA2 c.[id number] (p.Glu260Serfs*15) pathogenic mutation identified on the 9 gene STAT panel.  The STAT Breast cancer panel offered by [facility] includes sequencing and rearrangement  analysis for the following 9 genes:  ATM, BRCA1, BRCA2, CDH1, CHEK2, PALB2, PTEN, STK11 and TP53.   The report date is April 17, 2017.  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  The Hereditary Gene Panel offered by [facility] includes sequencing and/or deletion duplication testing of the following 46 genes: APC, ATM, AXIN2, BARD1, BMPR1A, BRCA1, BRCA2, BRIP1, CDH1, CDKN2A (p14ARF), CDKN2A (p16INK4a), CHEK2, CTNNA1, DICER1, EPCAM (Deletion/duplication testing only), GREM1 (promoter region deletion/duplication testing only), KIT, MEN1, MLH1, MSH2, MSH3, MSH6, MUTYH, NBN, NF1, NHTL1, PALB2, PDGFRA, PMS2, POLD1, POLE, PTEN, RAD50, RAD51C, RAD51D, SDHB, SDHC, SDHD, SMAD4, SMARCA4. STK11, TP53, TSC1, TSC2, and VHL.  The following genes were evaluated for sequence changes only: SDHA and HOXB13 c.251G>A variant only.  The report date is April 29, 2017.       04/27/2017 Surgery    Surgey 04/27/17 BILATERAL SKIN SPARING MASTECTOMY and LEFT AXILLARY LYMPH NODE and BILATERAL BREAST RECONSTRUCTION WITH PLACEMENT OF TISSUE EXPANDER AND FLEX HD (ACELLULAR HYDRATED DERMIS) Bilateral BY Dr. Barry Dienes and Dr. Marla Roe       04/27/2017 Pathology Results     Diagnosis  04/27/17 1. Breast, simple mastectomy, Right - FIBROCYSTIC CHANGES WITH ADENOSIS. - USUAL DUCTAL HYPERPLASIA. - HEALING BIOPSY SITE. - THERE IS NO EVIDENCE OF MALIGNANCY. 2. Breast, simple mastectomy, Left - INVASIVE DUCTAL CARCINOMA, GRADE II/III, SPANNING 1.2 CM. - DUCTAL CARCINOMA IN SITU WITH CALCIFICATIONS, HIGH GRADE. - INVASIVE DUCTAL CARCINOMA IS FOCALLY PRESENT AT THE ANTERIOR MARGIN. - DUCTAL CARCINOMA IN SITU IS BROADLY 0.1 CM TO THE POSTERIOR MARGIN. - SEE ONCOLOGY TABLE BELOW. 3. Lymph nodes, regional resection, Left axillary - METASTATIC CARCINOMA IN 8 OF 21 LYMPH NODES (8/21). - SEE COMMENT      06/09/2017 -  Chemotherapy     Adriamycin and Cytoxan every 2 weeks, for 4 cycles 06/09/17-07/21/17, followed by weekly Abraxane and carboplatin for 12 weeks starting 08/11/17. Held Carbo with cycle 2 due to poor toleration. After cycle 2 may change Abraxane to Taxol as her insurance has denied Abraxane. Changed to Apraxane for Cycle 3 on 09/08/17        HISTORY OF PRESENTING ILLNESS: 03/29/17 Madison Savage 47 y.o. female is here because of newly diagnosed Breast cancer metastasized to axillary lymph node, left. She presents to Breast Clinic today with her husband. She felt the lump herself June 22nd. It was soft and "squishy" and it moved. First in the left breast and then four days latter in the left axilla. She feels they have gotten smaller.   In the past she was diagnosed with being ITP positive. Period has been irregular more since her last miscarriage in 2015. She has MTHFR gene. In pregnancy her plt counts have gone low. She has not seen homologist for this. All 3 children were vaginal births.  Today she reports that with the lumps she feels a dull ache that comes and goes. She has not noticed any nipple or skin change. She had not had a mammogram since she was 35. Her PCP was going to set her up with a mammogram last year. Dr. Ronita Hipps is her OB and Dr. Sherren Mocha is her current PCP. She feels no change in appetite or weight loss.  She quit smoking 3 weeks ago. She works out Scientist, product/process development. She reports her last attempt at the MRI, the contrast made her feel like she could not breathe, her head was pounding and she felt hot all over and she was shaking. The trouble breathing only lasted while she was in the MRI.  Husband shared concern with insurance and financial. And referrals to second opinions.    GYN HISTORY  Menarchal: 16 LMP: 03/05/2017 Contraceptive: none HRT: N/A G7P3: - 3 miscarriages, 1 abortion    CURRENT THERAPY: Adriamycin and Cytoxan every 2 weeks, for 4 cycles 06/09/17-07/21/17, followed by weekly Abraxane and  carboplatin for 12 weeks starting 08/11/17. Held Carbo with cycle 2 due to poor toleration, after cycle 2 may change Abraxane to Taxol as her insurance has denied Abraxane. Changed to Apraxane for Cycle 3 on 09/08/17  INTERVAL HISTORY:  Madison Savage is here for a follow up. She is doing well overall and reports that she tolerated her chemotherapy well after cycle 7. She notes to have some mild fatigue due to insomnia.  She is going to schedule breast reconstruction in April 2019. She will start radiation this summer and follow with a hysterectomy in the fall.   On review of systems, pt denies nausea, neuropathy or any other complaints at this time. Pertinent positives are listed and detailed within the above HPI.    MEDICAL HISTORY:  Past Medical History:  Diagnosis Date  . Anemia   . Anxiety   . Cancer ([place]) 03/2017   left breast cancer  . Chronic ITP (idiopathic thrombocytopenia) ([facility])   . Family history of breast cancer   . Family history of ovarian cancer   . Headache    last migraine...9/24  . Heart murmur    at birth.  none now  . MTHFR mutation [facility])     SURGICAL HISTORY: Past Surgical History:  Procedure Laterality Date  . AXILLARY LYMPH NODE DISSECTION Left 04/27/2017   Procedure: AXILLARY LYMPH NODE DISSECTION;  Surgeon: [name], MD;  Location: [place];  Service: General;  Laterality: Left;  . BILATERAL TOTAL MASTECTOMY WITH AXILLARY LYMPH NODE DISSECTION Bilateral 04/27/2017   Procedure: SKIN SPARING MASTECTOMY;  Surgeon: [name], MD;  Location: [place];  Service: General;  Laterality: Bilateral;  . BREAST RECONSTRUCTION WITH PLACEMENT OF TISSUE EXPANDER AND FLEX HD (ACELLULAR HYDRATED DERMIS) Bilateral 04/27/2017   Procedure: BILATERAL BREAST RECONSTRUCTION WITH PLACEMENT OF TISSUE EXPANDER AND FLEX HD (ACELLULAR HYDRATED DERMIS);  Surgeon: Wallace Going, DO;  Location: [place];  Service: Plastics;   Laterality: Bilateral;  . BREAST SURGERY    . MASTECTOMY    . PORTACATH PLACEMENT Right 06/01/2017   Procedure: INSERTION PORT-A-CATH RIGHT SUBCLAVIAN;  Surgeon: [name], MD;  Location: [place];  Service: General;  Laterality: Right;  . WISDOM TOOTH EXTRACTION      SOCIAL HISTORY: Social History   Socioeconomic History  . Marital status: Married    Spouse name: Not on file  . Number of children: 3  . Years of education: Not on file  . Highest education level: Not on file  Social Needs  . Financial resource strain: Not on file  . Food insecurity - worry: Not on file  . Food insecurity - inability: Not on file  . Transportation needs - medical: Not on file  . Transportation needs - non-medical: Not on file  Occupational History    Employer: UNEMPLOYED  Tobacco Use  . Smoking  status: Former Smoker    Packs/day: 0.50    Years: 20.00    Pack years: 10.00    Types: Cigarettes    Last attempt to quit: 03/05/2017    Years since quitting: 0.6  . Smokeless tobacco: Never Used  Substance and Sexual Activity  . Alcohol use: No  . Drug use: No  . Sexual activity: Yes    Partners: Male    Birth control/protection: None  Other Topics Concern  . Not on file  Social History Narrative   Stay at home mom   3 children ages 62, 67, 75    FAMILY HISTORY: Family History  Problem Relation Age of Onset  . Breast cancer Paternal Aunt 28  . Ovarian cancer Paternal Aunt 52  . Cancer Paternal Grandmother        breast cancer   . Cancer Maternal Aunt        lymphoma   . Cancer Paternal Grandfather        throat cancer   . Cancer Paternal Aunt        lung cancer  . Cirrhosis Father   . AAA (abdominal aortic aneurysm) Maternal Grandfather   . Leukemia Paternal Aunt     ALLERGIES:  is allergic to clindamycin; penicillins; and codeine.  MEDICATIONS:  Current Outpatient Medications  Medication Sig Dispense Refill  . b complex vitamins capsule Take 1 capsule by mouth daily.    .  diazepam (VALIUM) 2 MG tablet Take 1 tablet (2 mg total) by mouth every 6 (six) hours as needed for anxiety or muscle spasms. 30 tablet 0  . Multiple Vitamins-Calcium (ONE-A-DAY WOMENS PO) Take 1 tablet by mouth daily.    Marland Kitchen OVER THE COUNTER MEDICATION Take 1 capsule by mouth daily. Biotin/MSM    . prochlorperazine (COMPAZINE) 10 MG tablet Take 1 tablet (10 mg total) by mouth every 6 (six) hours as needed for nausea or vomiting. 30 tablet 1  . traMADol (ULTRAM) 50 MG tablet Take 1 tablet (50 mg total) by mouth every 6 (six) hours as needed. (Patient not taking: Reported on 10/06/2017) 15 tablet 1   Current Facility-Administered Medications  Medication Dose Route Frequency Provider Last Rate Last Dose  . LORazepam (ATIVAN) injection 0.5 mg  0.5 mg Intravenous Once [name], MD       Facility-Administered Medications Ordered in Other Visits  Medication Dose Route Frequency Provider Last Rate Last Dose  . prochlorperazine (COMPAZINE) tablet 10 mg  10 mg Oral Once [name], MD      . sodium chloride flush (NS) 0.9 % injection 10 mL  10 mL Intravenous PRN [name], MD   10 mL at 07/21/17 1323  . sodium chloride flush (NS) 0.9 % injection 10 mL  10 mL Intracatheter PRN [name], MD   10 mL at 10/20/17 1358    REVIEW OF SYSTEMS:   Constitutional: Denies fevers, chills or abnormal night sweats (+) mild-moderate insomnia   Eyes: Denies blurriness of vision, double vision or watery eyes Ears, nose, mouth, throat, and face: Denies mucositis or sore throat  Respiratory: Denies dyspnea or wheezes  Cardiovascular: Denies palpitation, chest discomfort or lower extremity swelling Gastrointestinal:  Denies nausea, heartburn or change in bowel habits Skin: Denies abnormal skin rashes (+) palpable boil in right axilla  Lymphatics: Denies new lymphadenopathy or easy bruising Breast: (+) pinching in chest post surgery Neurological:Denies numbness, tingling or new weaknesses Behavioral/Psych: Mood is stable,  no new changes  All other systems were reviewed  with the patient and are negative.   PHYSICAL EXAMINATION: ECOG PERFORMANCE STATUS: 0 - Asymptomatic  Vitals:   10/20/17 1055  BP: 109/73  Pulse: 87  Resp: 18  Temp: 98.8 F (37.1 C)  SpO2: 99%   Filed Weights   10/20/17 1055  Weight: 126 lb 4.8 oz (57.3 kg)     GENERAL:alert, no distress and comfortable SKIN: skin color, texture, turgor are normal, no rashes or significant lesions (+) a very small boil in right axilla  EYES: normal, conjunctiva are pink and non-injected, sclera clear OROPHARYNX:no exudate, no erythema and lips, buccal mucosa, and tongue normal  NECK: supple, thyroid normal size, non-tender, without nodularity LYMPH:  no palpable lymphadenopathy in the cervical, axillary or inguinal LUNGS: clear to auscultation and percussion with normal breathing effort HEART: regular rate & rhythm and no murmurs and no lower extremity edema ABDOMEN:abdomen soft, non-tender and normal bowel sounds Musculoskeletal:no cyanosis of digits and no clubbing  PSYCH: alert & oriented x 3 with fluent speech NEURO: no focal motor/sensory deficits Breast: Status post bilateral mastectomy and tissue expander placements, surgical incisions have healed well, no discharge or skin erythema.   LABORATORY DATA:  I have reviewed the data as listed CBC Latest Ref Rng & Units 10/20/2017 10/12/2017 10/06/2017  WBC 3.9 - 10.3 K/uL 4.1 3.3(L) 3.9  Hemoglobin 11.6 - 15.9 g/dL 12.4 12.1 12.2  Hematocrit 34.8 - 46.6 % 36.1 35.3 36.4  Platelets 145 - 400 K/uL 242 194 177    CMP Latest Ref Rng & Units 10/20/2017 10/12/2017 10/06/2017  Glucose 70 - 140 mg/dL 99 96 97  BUN 7 - 26 mg/dL _0 Creatinine 0.60 - 1.10 mg/dL 0.92 0.76 0.78  Sodium 136 - 145 mmol/L 142 140 140  Potassium 3.5 - 5.1 mmol/L 4.0 4.1 4.2  Chloride 98 - 109 mmol/L 108 108 107  CO2 22 - 29 mmol/L _1 Calcium 8.4 - 10.4 mg/dL 9.2 8.8 9.7  Total Protein 6.4 - 8.3 g/dL 6.3(L)  6.0(L) 6.4  Total Bilirubin 0.2 - 1.2 mg/dL <0.2(L) 0.3 <0.2(L)  Alkaline Phos 40 - 150 U/L 73 66 71  AST 5 - 34 U/L _2 ALT 0 - 55 U/L _3 PATHOLOGY   Diagnosis  04/27/17 1. Breast, simple mastectomy, Right - FIBROCYSTIC CHANGES WITH ADENOSIS. - USUAL DUCTAL HYPERPLASIA. - HEALING BIOPSY SITE. - THERE IS NO EVIDENCE OF MALIGNANCY. 2. Breast, simple mastectomy, Left - INVASIVE DUCTAL CARCINOMA, GRADE II/III, SPANNING 1.2 CM. - DUCTAL CARCINOMA IN SITU WITH CALCIFICATIONS, HIGH GRADE. - INVASIVE DUCTAL CARCINOMA IS FOCALLY PRESENT AT THE ANTERIOR MARGIN. - DUCTAL CARCINOMA IN SITU IS BROADLY 0.1 CM TO THE POSTERIOR MARGIN. - SEE ONCOLOGY TABLE BELOW. 3. Lymph nodes, regional resection, Left axillary - METASTATIC CARCINOMA IN 8 OF 21 LYMPH NODES (8/21). - SEE COMMENT. Microscopic Comment 2. BREAST, INVASIVE TUMOR Procedure: Simple mastectomy and axillary lymph node resections. Laterality: Left. Tumor Size: 1.2 cm (glass slide measurement). Histologic Type: Ductal Grade: II Tubular Differentiation: 3 Nuclear Pleomorphism: 3 Mitotic Count: 1 Ductal Carcinoma in Situ (DCIS): Present, high grade, extensive. Extent of Tumor: Confined to breast parenchyma. Margins: Invasive carcinoma, distance from closest margin: Focally present at the anterior margin. DCIS, distance from closest margin: Broadly less than 0.1 cm to the posterior margin. Regional Lymph Nodes: Number of Lymph Nodes Examined: 21 1 of 4 FINAL for SILENA, WYSS [id number]) Microscopic Comment(continued) Lymph Nodes with Macrometastases: 6 Lymph Nodes with  Micrometastases: 0 Lymph Nodes with Isolated Tumor Cells: 2 Breast Prognostic Profile: [id number] Estrogen Receptor: 95%, strong. Progesterone Receptor: 5%, strong. Her2: No amplification was detected. The ratio is 1.23. Ki-67: 15%. Best tumor block for sendout testing: 2G Pathologic Stage Classification (pTNM, AJCC 8th  Edition): Primary Tumor (pT): pT1c Regional Lymph Nodes (pN): pN2a Distant Metastases (pM): pMX Comments: Grossly, there is a 2.5 cm focus of indurated tissue identified. Histologic examination of this area reveals predominately high grade ductal carcinoma in situ with calcifications. There is associated invasive ductal carcinoma, which is E-cadherin positive, spanning 1.2 cm. (JBK:gt, 05/02/17) 3. Immunohistochemical stains for cytokeratin AE1/AE3 performed on multiple blocks on part 3 help highlight the presence of metastatic carcinoma. (JBK:gt, 05/02/17)   Diagnosis 03/27/17 1. Breast, left, needle core biopsy -FIBROADENOMATOID NODULE WITH CALCIFICATIONS 2.Breast, left, needle core biopsy -DUCTAL CARCINOMA IN SITU, HIGH GRADE -SEE COMMENT  3. Breast, right, needle, core biopsy -FIBROADENOMA -NO MALIGNANCY IDENTIFIED  Diagnosis 03/22/17 Lymph node, needle/core biopsy, left axillary node -METASTATIC CARCINOMA, SEE COMMENT Microscopic Comment Sections showed lymph node tissue mostly replaced by metastatic carcinoma characterized by sheets of epithelioid appearing cells displaying milder to moderate nuclear pleomorphism, fine chromatin, small nucleoli, and moderately abundant amphophilic to eosinophilic cyto-plasma. Immunohistochemistry staining showed positive for cytokeratin AE1/AE3, GATA-3 and estrogen receptor, with patchy positivity for cytokeratin 7 and GCDFP., Only scattered cells showed progesterone positivity. Negative for S100, melan-a, E-cadherin, cytokeratin 20, CDX-2, TTF-1 or WT-1. The findings are consistent with metastatic carcinoma of breast primary. Lack of significant e-cadherin positivity suggested lobular carcinoma.   GENETICS 04/10/17    PROCEDURES   ECHO 05/31/17 Study Conclusions - Left ventricle: The cavity size was normal. Wall thickness was   normal. Systolic function was normal. The estimated ejection   fraction was in the range of 55% to 60%. Wall motion  was normal;   there were no regional wall motion abnormalities. Impressions: - Normal LV systolic and diastolic function; global longitudinal   strain - 17.9%.  RADIOGRAPHIC STUDIES: I have personally reviewed the radiological images as listed and agreed with the findings in the report.  CT CAP 04/07/17 IMPRESSION: 1. Several prominent left axillary lymph nodes which may correspond to biopsy proven axillary lymph node metastasis. 2. No thoracic adenopathy identified or evidence of distant metastatic disease.   Bone whole body scan 04/07/17 IMPRESSION: Negative for evidence of osseous metastatic disease.  Diagnostic mammogram bilateral 03/21/17 IMPRESSION: INCOMPLETE - ADDITIONAL IMAGING EVALUATION NEEDED 1. The 0.6 cm oval mass in the right breast at 11 o'clock middle depth is indeterminate. An ultrasound is recommended.  2. The 0.5cm round focal asymmetry in the left breast central to the nipple middle depth is indeterminate. An ultrasound is recommended.  3. Ultrasound of the palpable abnormalities in the axilla and far left lateral breast is recommended.  4. Multiple clusters of pleomorphic calcifications in the left upper outer breast anterior depth spanning a 2.5 cm area are highly suggestive of malignancy. A stereotactic biopsy is recommended.   US Breast bilateral 03/21/17 IMPRESSION: HIGHLY SUGGESTIVE OF MALIGNANCY 1. Six distinct abnormal left axillary nodes, 2 of which correspond to the palpable lumps felt by the patient. Findings concerning for metastatic adenopathy. Ultrasound guided biopsy of one of these nodes is recommended.  2. Small 5 mm palpable, oval mass in the left breast 3:00 areolar margin is at an intermediate suspicion. Ultrasound guided biopsy is recommended.  3. Isoechoic 8 mm mass in the right breast 10:00 5 cm from the nipple is also  an intermediate suspicion for malignancy, and ultrasound guided biopsy is recommended.  4. Stereotactic guided biopsy for the  highly suspicious calcifications in the left breast also recommended as detailed in the mammography report.    ASSESSMENT & PLAN:  Madison Savage is a 47 y.o. perimenopausal female with a history of Chronic ITP and MTHFR mutation, presented with a palpable left axillary adenopathy.  1. 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 She had a 1.2 cm left breast invasive ductal carcinoma, with 8 positive lymph nodes. Surgical margins were negative. -her staging CT and bone scan was negative -We discussed the risk of cancer recurrence after surgery. Due to her multiple positive lymph nodes, young age, she has high risk for recurrence.  -I recommended adjuvant chemotherapy to reduce her risk of recurrence. I recommend standard dose dense Adriamycin and Cytoxan every 2 weeks, for 4 cycles, followed by weekly Taxol or Abraxane for 12 weeks. Due to her BRCA2 mutation, and locally advanced disease, she probably would benefit from carboplatin also, I plan to add [place] to weekly Taxol. -Her Baseline 9/26/148 ECHO was normal -She started Adjuvant AC on 06/09/17, tolerating well so far, some fatigue, improves with IVF on week 2. Pt has been using Cold cap and it has been working for her thus far.  -She agreed to weekly Abraxane and Carboplatin and will continue Cold Cap. Due to worsened cold symptoms she started on 08/11/17.  -She did not tolerate the first cycle Carbo and Abraxane well.  Carboplatin has been held from cycle 2, she is tolerating weekly Abraxane very well. -I previously discussed recovery time for breast reconstruction surgery 4 weeks after last chemotherapy (12/2017). She will begin 6 weeks of radiation with Dr. Lisbeth Renshaw  following surgery. Pt is also planning to have hysterectomy in the fall.  -She will start Tamoxifen when she completes chemo.  -Okay to have port removed during surgery, I will send message to her plastic surgeon  -I previously discussed cancer surveillance after she completes adjuvant therapy. -We previously dicussed survivorship clinic after completion of treatment. She is very interested.  -She is tolerating treatment well, mild fatigue, labs reviewed today (10/20/17) are WNL. Pt can continue with cycle 8 of Abraxane today  -F/u in 2 weeks    2. Chronic ITP  -Discovered in her Last pregnancy, with platelet count dropped to between 50,000 and 60,000, recovered, her platelet counts is around 120-150K lately  -She does not follow up with hematologist  -I'm concerned her platelet counts will drop significantly with chemotherapy, she may need steroids or additional treatment for her ITP. -Her platelets have overall remained stable while on chemotherapy.   3. 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 -She has had bilateral mastectomy, due to the high-risk of breast cancer -She planned to have hysterectomy and BSO after she completes chemo, radiation and breast reconstruction -Patient has met our genetic counselor Mrs. Florene Glen, BRCA2 G mutation screening in her children and siblings  were recommended.    4. Chest pain, secondary to [facility] and Tissue Expander -Managed with Norco as needed, much better now  -Manageable, continue monitoring    PLAN:  -Continue weekly Abraxane cycle 8 today and continue weekly -F/u in 2 weeks with lab   No  orders of the defined types were placed in this encounter.   All questions were answered. The patient knows to call the clinic with any problems, questions or concerns. I spent 15 minutes counseling the patient face to face. The total time spent in the appointment was 20 minutes and more than 50% was on counseling.  This document serves as a record of services personally performed by [name], MD. It was created on her behalf by Theresia Bough, a trained medical scribe. The creation of this record is based on the scribe's personal observations and the provider's statements to them.   I have reviewed the above documentation for accuracy and completeness, and I agree with the above.    [name], MD 10/20/2017 4:42 PM

## 2017-10-20 ENCOUNTER — Inpatient Hospital Stay: Payer: BLUE CROSS/BLUE SHIELD

## 2017-10-20 ENCOUNTER — Encounter: Payer: Self-pay | Admitting: Hematology

## 2017-10-20 ENCOUNTER — Inpatient Hospital Stay (HOSPITAL_BASED_OUTPATIENT_CLINIC_OR_DEPARTMENT_OTHER): Payer: BLUE CROSS/BLUE SHIELD | Admitting: Hematology

## 2017-10-20 VITALS — BP 109/73 | HR 87 | Temp 98.8°F | Resp 18 | Ht 67.5 in | Wt 126.3 lb

## 2017-10-20 DIAGNOSIS — C773 Secondary and unspecified malignant neoplasm of axilla and upper limb lymph nodes: Principal | ICD-10-CM

## 2017-10-20 DIAGNOSIS — C50912 Malignant neoplasm of unspecified site of left female breast: Secondary | ICD-10-CM

## 2017-10-20 DIAGNOSIS — R079 Chest pain, unspecified: Secondary | ICD-10-CM | POA: Diagnosis not present

## 2017-10-20 DIAGNOSIS — G4700 Insomnia, unspecified: Secondary | ICD-10-CM | POA: Diagnosis not present

## 2017-10-20 DIAGNOSIS — Z5111 Encounter for antineoplastic chemotherapy: Secondary | ICD-10-CM | POA: Diagnosis not present

## 2017-10-20 DIAGNOSIS — Z1509 Genetic susceptibility to other malignant neoplasm: Secondary | ICD-10-CM | POA: Diagnosis not present

## 2017-10-20 DIAGNOSIS — D693 Immune thrombocytopenic purpura: Secondary | ICD-10-CM

## 2017-10-20 DIAGNOSIS — C50412 Malignant neoplasm of upper-outer quadrant of left female breast: Secondary | ICD-10-CM | POA: Diagnosis not present

## 2017-10-20 DIAGNOSIS — R5383 Other fatigue: Secondary | ICD-10-CM

## 2017-10-20 DIAGNOSIS — Z95828 Presence of other vascular implants and grafts: Secondary | ICD-10-CM

## 2017-10-20 LAB — COMPREHENSIVE METABOLIC PANEL
ALT: 18 U/L (ref 0–55)
AST: 18 U/L (ref 5–34)
Albumin: 3.9 g/dL (ref 3.5–5.0)
Alkaline Phosphatase: 73 U/L (ref 40–150)
Anion gap: 8 (ref 3–11)
BUN: 13 mg/dL (ref 7–26)
CO2: 26 mmol/L (ref 22–29)
Calcium: 9.2 mg/dL (ref 8.4–10.4)
Chloride: 108 mmol/L (ref 98–109)
Creatinine, Ser: 0.92 mg/dL (ref 0.60–1.10)
GFR calc Af Amer: >60 mL/min (ref >60)
GFR calc non Af Amer: >60 mL/min (ref >60)
Glucose, Bld: 99 mg/dL (ref 70–140)
Potassium: 4 mmol/L (ref 3.5–5.1)
Sodium: 142 mmol/L (ref 136–145)
Total Bilirubin: <0.2 mg/dL — ABNORMAL LOW (ref 0.2–1.2)
Total Protein: 6.3 g/dL — ABNORMAL LOW (ref 6.4–8.3)

## 2017-10-20 LAB — CBC WITH DIFFERENTIAL/PLATELET
Basophils Absolute: 0 10*3/uL (ref 0.0–0.1)
Basophils Relative: 1 %
Eosinophils Absolute: 0.1 10*3/uL (ref 0.0–0.5)
Eosinophils Relative: 2 %
HCT: 36.1 % (ref 34.8–46.6)
Hemoglobin: 12.4 g/dL (ref 11.6–15.9)
Lymphocytes Relative: 24 %
Lymphs Abs: 1 10*3/uL (ref 0.9–3.3)
MCH: 32.4 pg (ref 25.1–34.0)
MCHC: 34.2 g/dL (ref 31.5–36.0)
MCV: 94.8 fL (ref 79.5–101.0)
Monocytes Absolute: 0.4 10*3/uL (ref 0.1–0.9)
Monocytes Relative: 9 %
Neutro Abs: 2.6 10*3/uL (ref 1.5–6.5)
Neutrophils Relative %: 64 %
Platelets: 242 10*3/uL (ref 145–400)
RBC: 3.81 MIL/uL (ref 3.70–5.45)
RDW: 13.9 % (ref 11.2–14.5)
WBC: 4.1 10*3/uL (ref 3.9–10.3)

## 2017-10-20 MED ORDER — SODIUM CHLORIDE 0.9% FLUSH
10.0000 mL | INTRAVENOUS | Status: DC | PRN
  Administered 2017-10-20: 10 mL
  Filled 2017-10-20: qty 10

## 2017-10-20 MED ORDER — PACLITAXEL PROTEIN-BOUND CHEMO INJECTION 100 MG
100.0000 mg/m2 | Freq: Once | INTRAVENOUS | Status: AC
  Administered 2017-10-20: 150 mg via INTRAVENOUS
  Filled 2017-10-20: qty 30

## 2017-10-20 MED ORDER — SODIUM CHLORIDE 0.9 % IV SOLN
Freq: Once | INTRAVENOUS | Status: AC
  Administered 2017-10-20: 12:00:00 via INTRAVENOUS

## 2017-10-20 MED ORDER — PROCHLORPERAZINE MALEATE 10 MG PO TABS: 10.0000 mg | ORAL_TABLET | Freq: Once | ORAL | Status: DC

## 2017-10-20 MED ORDER — SODIUM CHLORIDE 0.9% FLUSH
10.0000 mL | Freq: Once | INTRAVENOUS | Status: AC
  Administered 2017-10-20: 10 mL
  Filled 2017-10-20: qty 10

## 2017-10-20 MED ORDER — HEPARIN SOD (PORK) LOCK FLUSH 100 UNIT/ML IV SOLN
500.0000 [IU] | Freq: Once | INTRAVENOUS | Status: AC | PRN
  Administered 2017-10-20: 500 [IU]
  Filled 2017-10-20: qty 5

## 2017-10-20 NOTE — Patient Instructions (Signed)
[facility] Discharge Instructions for Patients Receiving Chemotherapy   Today you received the following chemotherapy agents Abraxane.   To help prevent nausea and vomiting after your treatment, we encourage you to take your nausea medicationas prescribed.  If you develop nausea and vomiting that is not controlled by your nausea medication, call the clinic.   BELOW ARE SYMPTOMS THAT SHOULD BE REPORTED IMMEDIATELY:  *FEVER GREATER THAN 100.5 F  *CHILLS WITH OR WITHOUT FEVER  NAUSEA AND VOMITING THAT IS NOT CONTROLLED WITH YOUR NAUSEA MEDICATION  *UNUSUAL SHORTNESS OF BREATH  *UNUSUAL BRUISING OR BLEEDING  TENDERNESS IN MOUTH AND THROAT WITH OR WITHOUT PRESENCE OF ULCERS  *URINARY PROBLEMS  *BOWEL PROBLEMS  UNUSUAL RASH Items with * indicate a potential emergency and should be followed up as soon as possible.  Feel free to call the clinic should you have any questions or concerns. The clinic phone number is [phone].  Please show the CHEMO ALERT CARD at check-in to the Emergency Department and triage nurse.  

## 2017-10-20 NOTE — Patient Instructions (Signed)
Implanted Port Home Guide An implanted port is a type of central line that is placed under the skin. Central lines are used to provide IV access when treatment or nutrition needs to be given through a person's veins. Implanted ports are used for long-term IV access. An implanted port may be placed because:  You need IV medicine that would be irritating to the small veins in your hands or arms.  You need long-term IV medicines, such as antibiotics.  You need IV nutrition for a long period.  You need frequent blood draws for lab tests.  You need dialysis.  Implanted ports are usually placed in the chest area, but they can also be placed in the upper arm, the abdomen, or the leg. An implanted port has two main parts:  Reservoir. The reservoir is round and will appear as a small, raised area under your skin. The reservoir is the part where a needle is inserted to give medicines or draw blood.  Catheter. The catheter is a thin, flexible tube that extends from the reservoir. The catheter is placed into a large vein. Medicine that is inserted into the reservoir goes into the catheter and then into the vein.  How will I care for my incision site? Do not get the incision site wet. Bathe or shower as directed by your health care provider. How is my port accessed? Special steps must be taken to access the port:  Before the port is accessed, a numbing cream can be placed on the skin. This helps numb the skin over the port site.  Your health care provider uses a sterile technique to access the port. ? Your health care provider must put on a mask and sterile gloves. ? The skin over your port is cleaned carefully with an antiseptic and allowed to dry. ? The port is gently pinched between sterile gloves, and a needle is inserted into the port.  Only "non-coring" port needles should be used to access the port. Once the port is accessed, a blood return should be checked. This helps ensure that the port  is in the vein and is not clogged.  If your port needs to remain accessed for a constant infusion, a clear (transparent) bandage will be placed over the needle site. The bandage and needle will need to be changed every week, or as directed by your health care provider.  Keep the bandage covering the needle clean and dry. Do not get it wet. Follow your health care provider's instructions on how to take a shower or bath while the port is accessed.  If your port does not need to stay accessed, no bandage is needed over the port.  What is flushing? Flushing helps keep the port from getting clogged. Follow your health care provider's instructions on how and when to flush the port. Ports are usually flushed with saline solution or a medicine called heparin. The need for flushing will depend on how the port is used.  If the port is used for intermittent medicines or blood draws, the port will need to be flushed: ? After medicines have been given. ? After blood has been drawn. ? As part of routine maintenance.  If a constant infusion is running, the port may not need to be flushed.  How long will my port stay implanted? The port can stay in for as long as your health care provider thinks it is needed. When it is time for the port to come out, surgery will be   done to remove it. The procedure is similar to the one performed when the port was put in. When should I seek immediate medical care? When you have an implanted port, you should seek immediate medical care if:  You notice a bad smell coming from the incision site.  You have swelling, redness, or drainage at the incision site.  You have more swelling or pain at the port site or the surrounding area.  You have a fever that is not controlled with medicine.  This information is not intended to replace advice given to you by your health care provider. Make sure you discuss any questions you have with your health care provider. Document  Released: 08/22/2005 Document Revised: 01/28/2016 Document Reviewed: 04/29/2013 Elsevier Interactive Patient Education  2017 [employer] Inc.  

## 2017-10-27 ENCOUNTER — Inpatient Hospital Stay: Payer: BLUE CROSS/BLUE SHIELD

## 2017-10-27 VITALS — BP 88/74 | HR 82 | Temp 98.3°F | Resp 17 | Wt 129.5 lb

## 2017-10-27 DIAGNOSIS — C773 Secondary and unspecified malignant neoplasm of axilla and upper limb lymph nodes: Principal | ICD-10-CM

## 2017-10-27 DIAGNOSIS — G4700 Insomnia, unspecified: Secondary | ICD-10-CM | POA: Diagnosis not present

## 2017-10-27 DIAGNOSIS — C50912 Malignant neoplasm of unspecified site of left female breast: Secondary | ICD-10-CM

## 2017-10-27 DIAGNOSIS — Z5111 Encounter for antineoplastic chemotherapy: Secondary | ICD-10-CM | POA: Diagnosis not present

## 2017-10-27 DIAGNOSIS — Z1509 Genetic susceptibility to other malignant neoplasm: Secondary | ICD-10-CM | POA: Diagnosis not present

## 2017-10-27 DIAGNOSIS — C50412 Malignant neoplasm of upper-outer quadrant of left female breast: Secondary | ICD-10-CM | POA: Diagnosis not present

## 2017-10-27 DIAGNOSIS — R5383 Other fatigue: Secondary | ICD-10-CM | POA: Diagnosis not present

## 2017-10-27 DIAGNOSIS — D693 Immune thrombocytopenic purpura: Secondary | ICD-10-CM | POA: Diagnosis not present

## 2017-10-27 DIAGNOSIS — R079 Chest pain, unspecified: Secondary | ICD-10-CM | POA: Diagnosis not present

## 2017-10-27 LAB — COMPREHENSIVE METABOLIC PANEL
ALT: 22 U/L (ref 0–55)
AST: 17 U/L (ref 5–34)
Albumin: 3.8 g/dL (ref 3.5–5.0)
Alkaline Phosphatase: 68 U/L (ref 40–150)
Anion gap: 10 (ref 3–11)
BUN: 16 mg/dL (ref 7–26)
CO2: 25 mmol/L (ref 22–29)
Calcium: 9.6 mg/dL (ref 8.4–10.4)
Chloride: 107 mmol/L (ref 98–109)
Creatinine, Ser: 0.82 mg/dL (ref 0.60–1.10)
GFR calc Af Amer: >60 mL/min (ref >60)
GFR calc non Af Amer: >60 mL/min (ref >60)
Glucose, Bld: 87 mg/dL (ref 70–140)
Potassium: 4.1 mmol/L (ref 3.5–5.1)
Sodium: 142 mmol/L (ref 136–145)
Total Bilirubin: 0.2 mg/dL (ref 0.2–1.2)
Total Protein: 6.2 g/dL — ABNORMAL LOW (ref 6.4–8.3)

## 2017-10-27 LAB — CBC WITH DIFFERENTIAL/PLATELET
Basophils Absolute: 0 10*3/uL (ref 0.0–0.1)
Basophils Relative: 1 %
Eosinophils Absolute: 0.1 10*3/uL (ref 0.0–0.5)
Eosinophils Relative: 2 %
HCT: 36.4 % (ref 34.8–46.6)
Hemoglobin: 12.2 g/dL (ref 11.6–15.9)
Lymphocytes Relative: 26 %
Lymphs Abs: 0.9 10*3/uL (ref 0.9–3.3)
MCH: 31.6 pg (ref 25.1–34.0)
MCHC: 33.4 g/dL (ref 31.5–36.0)
MCV: 94.7 fL (ref 79.5–101.0)
Monocytes Absolute: 0.3 10*3/uL (ref 0.1–0.9)
Monocytes Relative: 10 %
Neutro Abs: 2.1 10*3/uL (ref 1.5–6.5)
Neutrophils Relative %: 61 %
Platelets: 185 10*3/uL (ref 145–400)
RBC: 3.84 MIL/uL (ref 3.70–5.45)
RDW: 14.1 % (ref 11.2–14.5)
WBC: 3.4 10*3/uL — ABNORMAL LOW (ref 3.9–10.3)

## 2017-10-27 MED ORDER — SODIUM CHLORIDE 0.9 % IV SOLN
Freq: Once | INTRAVENOUS | Status: AC
  Administered 2017-10-27: 11:00:00 via INTRAVENOUS

## 2017-10-27 MED ORDER — SODIUM CHLORIDE 0.9% FLUSH
10.0000 mL | INTRAVENOUS | Status: DC | PRN
  Administered 2017-10-27: 10 mL
  Filled 2017-10-27: qty 10

## 2017-10-27 MED ORDER — HEPARIN SOD (PORK) LOCK FLUSH 100 UNIT/ML IV SOLN
500.0000 [IU] | Freq: Once | INTRAVENOUS | Status: AC | PRN
  Administered 2017-10-27: 500 [IU]
  Filled 2017-10-27: qty 5

## 2017-10-27 MED ORDER — PACLITAXEL PROTEIN-BOUND CHEMO INJECTION 100 MG
100.0000 mg/m2 | Freq: Once | INTRAVENOUS | Status: AC
  Administered 2017-10-27: 150 mg via INTRAVENOUS
  Filled 2017-10-27: qty 30

## 2017-10-27 MED ORDER — PROCHLORPERAZINE MALEATE 10 MG PO TABS: 10.0000 mg | ORAL_TABLET | Freq: Once | ORAL | Status: DC

## 2017-10-27 NOTE — Patient Instructions (Signed)
[facility] Discharge Instructions for Patients Receiving Chemotherapy   Today you received the following chemotherapy agents Abraxane.   To help prevent nausea and vomiting after your treatment, we encourage you to take your nausea medicationas prescribed.  If you develop nausea and vomiting that is not controlled by your nausea medication, call the clinic.   BELOW ARE SYMPTOMS THAT SHOULD BE REPORTED IMMEDIATELY:  *FEVER GREATER THAN 100.5 F  *CHILLS WITH OR WITHOUT FEVER  NAUSEA AND VOMITING THAT IS NOT CONTROLLED WITH YOUR NAUSEA MEDICATION  *UNUSUAL SHORTNESS OF BREATH  *UNUSUAL BRUISING OR BLEEDING  TENDERNESS IN MOUTH AND THROAT WITH OR WITHOUT PRESENCE OF ULCERS  *URINARY PROBLEMS  *BOWEL PROBLEMS  UNUSUAL RASH Items with * indicate a potential emergency and should be followed up as soon as possible.  Feel free to call the clinic should you have any questions or concerns. The clinic phone number is [phone].  Please show the CHEMO ALERT CARD at check-in to the Emergency Department and triage nurse.  

## 2017-11-01 ENCOUNTER — Ambulatory Visit: Payer: BLUE CROSS/BLUE SHIELD | Attending: General Surgery | Admitting: Physical Therapy

## 2017-11-01 DIAGNOSIS — Z483 Aftercare following surgery for neoplasm: Secondary | ICD-10-CM

## 2017-11-01 DIAGNOSIS — M25512 Pain in left shoulder: Secondary | ICD-10-CM | POA: Diagnosis not present

## 2017-11-01 DIAGNOSIS — R293 Abnormal posture: Secondary | ICD-10-CM | POA: Diagnosis not present

## 2017-11-01 DIAGNOSIS — M25612 Stiffness of left shoulder, not elsewhere classified: Secondary | ICD-10-CM | POA: Insufficient documentation

## 2017-11-01 NOTE — Therapy (Signed)
[place], [place], [address] Phone: [phone]   Fax:  [phone]  Physical Therapy Evaluation  Patient Details  Name: Madison Savage MRN: [id number] Date of Birth: 09-28-1970 Referring Provider: Dr. Burr Medico   Encounter Date: 11/01/2017  PT End of Session - 11/01/17 1246    Visit Number  1    Number of Visits  9    Date for PT Re-Evaluation  01/06/18    PT Start Time  1018    PT Stop Time  1100    PT Time Calculation (min)  42 min    Activity Tolerance  Patient tolerated treatment well    Behavior During Therapy  [facility] for tasks assessed/performed       Past Medical History:  Diagnosis Date  . Anemia   . Anxiety   . Cancer ([place]) 03/2017   left breast cancer  . Chronic ITP (idiopathic thrombocytopenia) (HCC)   . Family history of breast cancer   . Family history of ovarian cancer   . Headache    last migraine...9/24  . Heart murmur    at birth.  none now  . MTHFR mutation [facility])     Past Surgical History:  Procedure Laterality Date  . AXILLARY LYMPH NODE DISSECTION Left 04/27/2017   Procedure: AXILLARY LYMPH NODE DISSECTION;  Surgeon: [name], MD;  Location: [place];  Service: General;  Laterality: Left;  . BILATERAL TOTAL MASTECTOMY WITH AXILLARY LYMPH NODE DISSECTION Bilateral 04/27/2017   Procedure: SKIN SPARING MASTECTOMY;  Surgeon: [name], MD;  Location: [place];  Service: General;  Laterality: Bilateral;  . BREAST RECONSTRUCTION WITH PLACEMENT OF TISSUE EXPANDER AND FLEX HD (ACELLULAR HYDRATED DERMIS) Bilateral 04/27/2017   Procedure: BILATERAL BREAST RECONSTRUCTION WITH PLACEMENT OF TISSUE EXPANDER AND FLEX HD (ACELLULAR HYDRATED DERMIS);  Surgeon: Wallace Going, DO;  Location: [place];  Service: Plastics;  Laterality: Bilateral;  . BREAST SURGERY    . MASTECTOMY    . PORTACATH PLACEMENT Right 06/01/2017   Procedure: INSERTION PORT-A-CATH RIGHT SUBCLAVIAN;  Surgeon: [name], MD;  Location: [place];  Service: General;  Laterality: Right;  . WISDOM TOOTH EXTRACTION      There were no vitals filed for this visit.   Subjective Assessment - 11/01/17 1239    Subjective  Pt has her implant surgery scheduled for April 3 . She has been exercising that includes cardio and is up to 5 pounds  She developed some cording in her left axilla and upper arm that was painful  She had been wearing her sleeve.  She has an episode of increased cording in axilla over the weekend in increased pain in her left arm that she was able to resolve with self massage and use of compression .  She feels she would benefit from more soft tissue work to help relieve pain and more execise ideas for scapular strengthening     Pertinent History  Patient was diagnosed on 03/21/17 with left invasive lobular carcinoma breast cancer. It measures 2.1 cm and is located in the upper outer quadrant. She also has 6 abnormal axillary lymph nodes and a node was biopsied and found to be positive for carcinoma.   04/27/2017 .bilateral mastecomy with ALND ( 21 nodes removed) with immedicate expanders. She is planning to have chemo, then have implant surgery and hysterectomy and then have radiation     Patient Stated Goals  to get rid of shoulder pain  Currently in Pain?  Yes    Pain Score  4     Pain Location  Axilla    Pain Orientation  Left    Pain Radiating Towards  to left upper arm          [facility] PT Assessment - 11/01/17 0001      Assessment   Medical Diagnosis  Left breast cancer    Referring Provider  Dr. Burr Medico    Onset Date/Surgical Date  04/27/17    Prior Therapy  yes      Precautions   Precautions  Other (comment)    Precaution Comments  ongoing chemo      Restrictions   Weight Bearing Restrictions  No      Balance Screen   Has the patient fallen in the past 6 months  No    Has the patient had a decrease in activity  level because of a fear of falling?   No    Is the patient reluctant to leave their home because of a fear of falling?   No      Home Social worker  Private residence    Living Arrangements  Spouse/significant other;Children Husband, 45, 39, and 51 y.o. kids    Available Help at Discharge  Family      Prior Function   Level of Independence  Independent    Vocation  Unemployed    Leisure  exercises daily with cario, body weight and free weights       Cognition   Overall Cognitive Status  Within Functional Limits for tasks assessed      Observation/Other Assessments   Observations  bilateral expanders in place  visible cording in left axilla     Skin Integrity  no open areas       Sensation   Light Touch  Not tested      Coordination   Gross Motor Movements are Fluid and Coordinated  Yes      Posture/Postural Control   Posture/Postural Control  Postural limitations    Postural Limitations  Rounded Shoulders;Forward head    Posture Comments  no visible hiking of shoulders and posture appears improved from last visit       AROM   Right Shoulder ABduction  --    Left Shoulder Flexion  155 Degrees in supine, cord evident     Left Shoulder ABduction  --      Palpation   Palpation comment  tenderness and tightness at left posterior scapula and left axilla  trigger points         LYMPHEDEMA/ONCOLOGY QUESTIONNAIRE - 11/01/17 1028      Right Upper Extremity Lymphedema   10 cm Proximal to Olecranon Process  25 cm    Olecranon Process  23 cm    15 cm Proximal to Ulnar Styloid Process  21.3 cm    10 cm Proximal to Ulnar Styloid Process  17.5 cm    Just Proximal to Ulnar Styloid Process  13.5 cm    Across Hand at [employer]  17.5 cm    At [place] of 2nd Digit  5.2 cm      Left Upper Extremity Lymphedema   10 cm Proximal to Olecranon Process  25 cm    Olecranon Process  22.5 cm    15 cm Proximal to Ulnar Styloid Process  21 cm    10 cm Proximal to Ulnar  Styloid Process  17.5 cm    Just  Proximal to Ulnar Styloid Process  13 cm    Across Hand at [employer]  16.8 cm    At [place] of 2nd Digit  --          Objective measurements completed on examination: See above findings.      [facility] PT Treatment/Exercise - 11/01/17 0001      Shoulder Exercises: Seated   Other Seated Exercises  green theraband loop: 5 reps each of retraction and external rotation isometric, than same to set scapulae then reach arms forward, then same set and keep elbows bent to raise arms up and down, then same set and and reach arms up and forward extending elbows       Shoulder Exercises: Stretch   Other Shoulder Stretches  lying over purple squishy ball x 3 minutes for anterior chest stretch              PT Education - 11/01/17 1245    Education provided  Yes    Education Details  upgraded scapular exercise     Person(s) Educated  Patient    Methods  Demonstration    Comprehension  Verbalized understanding;Returned demonstration         PT Long Term Goals - 11/01/17 1253      PT LONG TERM GOAL #1   Title  Pt will report that the pain and discomfort in left shoulder has decreased by 50%     Time  8    Period  Weeks    Status  New      PT LONG TERM GOAL #2   Title  Pt will be independent in upgraded HEP for scapular strength and ROM     Time  8    Period  Weeks    Status  New        Plan - 11/01/17 1246    Clinical Impression Statement  Pt returns to PT for help with left shoulder pain with cording.  She has been actively exercising while having ongoing chemo and acutally has increaesed bilateral arm circumferences from increased muscle mass.  She has improved posture.  But she still has tightness in left posterior shoulder and lateral scapular area with  cording in axilla.  She will benefit from PT for prgressive stretching and exercise to scapular in preparation for implant surgery     History and Personal Factors relevant to plan  of care:  cares for 3 children at home, had ongoing shoulder and axillary pain     Clinical Presentation due to:  ongoing chemo with more surgery and radiation planned     Clinical Decision Making  Low    Rehab Potential  Good    Clinical Impairments Affecting Rehab Potential  21 nodes removed in left axilla     PT Frequency  1x / week    PT Duration  8 weeks    PT Treatment/Interventions  Therapeutic exercise;Patient/family education;ADLs/Self Care Home Management;Taping;DME Instruction;Dry needling;Functional mobility training;Therapeutic activities;Manual lymph drainage;Manual techniques;Passive range of motion    PT Next Visit Plan  assess effect of new exercise.  Progress scapular ROM and strengthening     Consulted and Agree with Plan of Care  Patient       Patient will benefit from skilled therapeutic intervention in order to improve the following deficits and impairments:  Decreased range of motion, Pain, Postural dysfunction, Increased fascial restricitons, Impaired perceived functional ability  Visit Diagnosis: Stiffness of left shoulder, not elsewhere classified - Plan:  PT plan of care cert/re-cert  Acute pain of left shoulder - Plan: PT plan of care cert/re-cert  Abnormal posture - Plan: PT plan of care cert/re-cert  Aftercare following surgery for neoplasm - Plan: PT plan of care cert/re-cert     Problem List Patient Active Problem List   Diagnosis Date Noted  . Port-A-Cath in place 08/18/2017  . Breast cancer ([place]) 04/27/2017  . Genetic testing 04/17/2017  . BRCA2 gene mutation positive 04/17/2017  . Family history of breast cancer   . Family history of ovarian cancer   . 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 Cough 03/10/2014  . Esophagitis 03/10/2014  . Chronic ITP (idiopathic thrombocytopenia) (HCC) 03/10/2014  . ALLERGIC REACTION, ACUTE 07/22/2008   Donato Heinz. Owens Shark PT  Norwood Levo 11/01/2017, 12:56 PM  [place], [place], [address] Phone: [phone]   Fax:  [phone]  Name: YURITZA PAULHUS MRN: [id number] Date of Birth: [birth year]

## 2017-11-01 NOTE — Progress Notes (Signed)
Madison Savage  [phone] [phone]  Clinic Follow up Note   Patient Care Team: Dorena Cookey, MD as PCP - [name], MD as Consulting Physician (General Surgery) [name], MD as Consulting Physician (Hematology) Kyung Rudd, MD as Consulting Physician (Radiation Oncology) Brien Few, MD as Consulting Physician (Obstetrics and Gynecology)   Date of Service:  11/03/2017  CHIEF COMPLAINTS:  Follow up left breast cancer   Oncology History   Cancer Staging Breast cancer metastasized to axillary lymph node, left [facility]) Staging form: Breast, AJCC 8th Edition - Clinical stage from 03/22/2017: Stage Unknown (cTX, cN1, cM0, GX, ER: Positive, PR: Positive, HER2: Negative) - Signed by [name], MD on 03/29/2017 - Pathologic stage from 04/27/2017: Stage IB (pT1c, pN2a, cM0, G2, ER: Positive, PR: Positive, HER2: Negative) - Signed by [name], MD on 05/25/2017 \     Breast cancer metastasized to axillary lymph node, left ([place])   03/21/2017 Mammogram    Diagnostic mammogram bilateral 03/21/17 IMPRESSION: INCOMPLETE - ADDITIONAL IMAGING EVALUATION NEEDED 1. The 0.6 cm oval mass in the right breast at 11 o'clock middle depth is indeterminate. An ultrasound is recommended.  2. The 0.5cm round focal asymmetry in the left breast central to the nipple middle depth is indeterminate. An ultrasound is recommended.  3. Ultrasound of the palpable abnormalities in the axilla and far left lateral breast is recommended.  4. Multiple clusters of pleomorphic calcifications in the left upper outer breast anterior depth spanning a 2.5 cm area are highly suggestive of malignancy. A stereotactic biopsy is recommended.        03/21/2017 Imaging    US Breast bilateral 03/21/17 IMPRESSION: HIGHLY SUGGESTIVE OF MALIGNANCY 1. Six distinct abnormal left axillary nodes, 2 of which correspond to the palpable lumps felt by the patient. Findings concerning for metastatic  adenopathy. Ultrasound guided biopsy of one of these nodes is recommended.  2. Small 5 mm palpable, oval mass in the left breast 3:00 areolar margin is at an intermediate suspicion. Ultrasound guided biopsy is recommended.  3. Isoechoic 8 mm mass in the right breast 10:00 5 cm from the nipple is also an intermediate suspicion for malignancy, and ultrasound guided biopsy is recommended.  4. Stereotactic guided biopsy for the highly suspicious calcifications in the left breast also recommended as detailed in the mammography report.        03/22/2017 Initial Biopsy    Diagnosis 03/22/17 Lymph node, needle/core biopsy, left axillary node -METASTATIC CARCINOMA, SEE COMMENT Microscopic Comment      03/22/2017 Receptors her2    Lymph node biopsy ER 95% positive, strong staining, PR 5% positive, weak staining, HER-2 negative, with HER2/CEP17 ratio 1.23 and copy #3.95.      03/27/2017 Pathology Results    Diagnosis 03/27/17 1. Breast, left, needle core biopsy -FIBROADENOMATOID NODULE WITH CALCIFICATIONS 2.Breast, left, needle core biopsy -DUCTAL CARCINOMA IN SITU, HIGH GRADE -SEE COMMENT  3. Breast, right, needle, core biopsy -FIBROADENOMA -NO MALIGNANCY IDENTIFIED       03/29/2017 Initial Diagnosis    Breast cancer metastasized to axillary lymph node, left ([place])      04/07/2017 Imaging    CT CAP 04/07/17 IMPRESSION: 1. Several prominent left axillary lymph nodes which may correspond to biopsy proven axillary lymph node metastasis. 2. No thoracic adenopathy identified or evidence of distant metastatic disease.      04/07/2017 Imaging    Bone whole body scan 04/07/17 IMPRESSION: Negative for evidence of osseous metastatic disease.  04/17/2017 Genetic Testing    BRCA2 c.[id number] (p.Glu260Serfs*15) pathogenic mutation identified on the 9 gene STAT panel.  The STAT Breast cancer panel offered by [facility] includes sequencing and rearrangement analysis for the following 9 genes:  ATM,  BRCA1, BRCA2, CDH1, CHEK2, PALB2, PTEN, STK11 and TP53.   The report date is April 17, 2017.  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  The Hereditary Gene Panel offered by [facility] includes sequencing and/or deletion duplication testing of the following 46 genes: APC, ATM, AXIN2, BARD1, BMPR1A, BRCA1, BRCA2, BRIP1, CDH1, CDKN2A (p14ARF), CDKN2A (p16INK4a), CHEK2, CTNNA1, DICER1, EPCAM (Deletion/duplication testing only), GREM1 (promoter region deletion/duplication testing only), KIT, MEN1, MLH1, MSH2, MSH3, MSH6, MUTYH, NBN, NF1, NHTL1, PALB2, PDGFRA, PMS2, POLD1, POLE, PTEN, RAD50, RAD51C, RAD51D, SDHB, SDHC, SDHD, SMAD4, SMARCA4. STK11, TP53, TSC1, TSC2, and VHL.  The following genes were evaluated for sequence changes only: SDHA and HOXB13 c.251G>A variant only.  The report date is April 29, 2017.       04/27/2017 Surgery    Surgey 04/27/17 BILATERAL SKIN SPARING MASTECTOMY and LEFT AXILLARY LYMPH NODE and BILATERAL BREAST RECONSTRUCTION WITH PLACEMENT OF TISSUE EXPANDER AND FLEX HD (ACELLULAR HYDRATED DERMIS) Bilateral BY Dr. Barry Dienes and Dr. Marla Roe       04/27/2017 Pathology Results     Diagnosis  04/27/17 1. Breast, simple mastectomy, Right - FIBROCYSTIC CHANGES WITH ADENOSIS. - USUAL DUCTAL HYPERPLASIA. - HEALING BIOPSY SITE. - THERE IS NO EVIDENCE OF MALIGNANCY. 2. Breast, simple mastectomy, Left - INVASIVE DUCTAL CARCINOMA, GRADE II/III, SPANNING 1.2 CM. - DUCTAL CARCINOMA IN SITU WITH CALCIFICATIONS, HIGH GRADE. - INVASIVE DUCTAL CARCINOMA IS FOCALLY PRESENT AT THE ANTERIOR MARGIN. - DUCTAL CARCINOMA IN SITU IS BROADLY 0.1 CM TO THE POSTERIOR MARGIN. - SEE ONCOLOGY TABLE BELOW. 3. Lymph nodes, regional resection, Left axillary - METASTATIC CARCINOMA IN 8 OF 21 LYMPH NODES (8/21). - SEE COMMENT      06/09/2017 -  Chemotherapy    Adriamycin and Cytoxan every 2 weeks,  for 4 cycles 06/09/17-07/21/17, followed by weekly Abraxane and carboplatin for 12 weeks starting 08/11/17. Held Carbo with cycle 2 due to poor toleration. After cycle 2 may change Abraxane to Taxol as her insurance has denied Abraxane. Changed to Apraxane for Cycle 3 on 09/08/17        HISTORY OF PRESENTING ILLNESS: 03/29/17 Madison Savage 47 y.o. female is here because of newly diagnosed Breast cancer metastasized to axillary lymph node, left. She presents to Breast Clinic today with her husband. She felt the lump herself June 22nd. It was soft and "squishy" and it moved. First in the left breast and then four days latter in the left axilla. She feels they have gotten smaller.   In the past she was diagnosed with being ITP positive. Period has been irregular more since her last miscarriage in 2015. She has MTHFR gene. In pregnancy her plt counts have gone low. She has not seen homologist for this. All 3 children were vaginal births.  Today she reports that with the lumps she feels a dull ache that comes and goes. She has not noticed any nipple or skin change. She had not had a mammogram since she was 42. Her PCP was going to set her up with a mammogram last year. Dr. Ronita Hipps is her OB and Dr. Sherren Mocha is her current PCP. She feels no change in appetite or weight loss. She quit smoking 3 weeks ago. She works  out everyday. She reports her last attempt at the MRI, the contrast made her feel like she could not breathe, her head was pounding and she felt hot all over and she was shaking. The trouble breathing only lasted while she was in the MRI.  Husband shared concern with insurance and financial. And referrals to second opinions.    GYN HISTORY  Menarchal: 16 LMP: 03/05/2017 Contraceptive: none HRT: N/A G7P3: - 3 miscarriages, 1 abortion    CURRENT THERAPY: Adriamycin and Cytoxan every 2 weeks, for 4 cycles 06/09/17-07/21/17, followed by weekly Abraxane and carboplatin for 12 weeks starting 08/11/17.  Held Carbo with cycle 2 due to poor toleration, after cycle 2 may change Abraxane to Taxol as her insurance has denied Abraxane. Changed to Apraxane for Cycle 3 on 09/08/17  INTERVAL HISTORY:  Madison Savage is here for a follow up. She presents to the clinic today by herself. She is doing well overall and reports that she tolerated her chemotherapy well after cycle 9. She notes that after Cycle 8 she experienced severe nausea for one day but she did not experience that with Cycle 9. She reports she is still having trouble sleeping, especially falling asleep. She has tried benadryl once with no relief. Her breast reconstruction and port removal are scheduled for 12/06/17.   On review of systems, pt denies panic-like symptoms, neuropathy or any other complaints at this time. Pertinent positives are listed and detailed within the above HPI.   MEDICAL HISTORY:  Past Medical History:  Diagnosis Date  . Anemia   . Anxiety   . Cancer ([place]) 03/2017   left breast cancer  . Chronic ITP (idiopathic thrombocytopenia) ([facility])   . Family history of breast cancer   . Family history of ovarian cancer   . Headache    last migraine...9/24  . Heart murmur    at birth.  none now  . MTHFR mutation [facility])     SURGICAL HISTORY: Past Surgical History:  Procedure Laterality Date  . AXILLARY LYMPH NODE DISSECTION Left 04/27/2017   Procedure: AXILLARY LYMPH NODE DISSECTION;  Surgeon: [name], MD;  Location: [place];  Service: General;  Laterality: Left;  . BILATERAL TOTAL MASTECTOMY WITH AXILLARY LYMPH NODE DISSECTION Bilateral 04/27/2017   Procedure: SKIN SPARING MASTECTOMY;  Surgeon: [name], MD;  Location: [place];  Service: General;  Laterality: Bilateral;  . BREAST RECONSTRUCTION WITH PLACEMENT OF TISSUE EXPANDER AND FLEX HD (ACELLULAR HYDRATED DERMIS) Bilateral 04/27/2017   Procedure: BILATERAL BREAST RECONSTRUCTION WITH PLACEMENT OF TISSUE EXPANDER AND FLEX HD  (ACELLULAR HYDRATED DERMIS);  Surgeon: Wallace Going, DO;  Location: [place];  Service: Plastics;  Laterality: Bilateral;  . BREAST SURGERY    . MASTECTOMY    . PORTACATH PLACEMENT Right 06/01/2017   Procedure: INSERTION PORT-A-CATH RIGHT SUBCLAVIAN;  Surgeon: [name], MD;  Location: [place];  Service: General;  Laterality: Right;  . WISDOM TOOTH EXTRACTION      SOCIAL HISTORY: Social History   Socioeconomic History  . Marital status: Married    Spouse name: Not on file  . Number of children: 3  . Years of education: Not on file  . Highest education level: Not on file  Social Needs  . Financial resource strain: Not on file  . Food insecurity - worry: Not on file  . Food insecurity - inability: Not on file  . Transportation needs - medical: Not on file  . Transportation needs - non-medical: Not  on file  Occupational History    Employer: UNEMPLOYED  Tobacco Use  . Smoking status: Former Smoker    Packs/day: 0.50    Years: 20.00    Pack years: 10.00    Types: Cigarettes    Last attempt to quit: 03/05/2017    Years since quitting: 0.6  . Smokeless tobacco: Never Used  Substance and Sexual Activity  . Alcohol use: No  . Drug use: No  . Sexual activity: Yes    Partners: Male    Birth control/protection: None  Other Topics Concern  . Not on file  Social History Narrative   Stay at home mom   3 children ages 50, 79, 2    FAMILY HISTORY: Family History  Problem Relation Age of Onset  . Breast cancer Paternal Aunt 56  . Ovarian cancer Paternal Aunt 87  . Cancer Paternal Grandmother        breast cancer   . Cancer Maternal Aunt        lymphoma   . Cancer Paternal Grandfather        throat cancer   . Cancer Paternal Aunt        lung cancer  . Cirrhosis Father   . AAA (abdominal aortic aneurysm) Maternal Grandfather   . Leukemia Paternal Aunt     ALLERGIES:  is allergic to clindamycin; penicillins; and codeine.  MEDICATIONS:  Current  Outpatient Medications  Medication Sig Dispense Refill  . b complex vitamins capsule Take 1 capsule by mouth daily.    . diazepam (VALIUM) 2 MG tablet Take 1 tablet (2 mg total) by mouth every 6 (six) hours as needed for anxiety or muscle spasms. 30 tablet 0  . Multiple Vitamins-Calcium (ONE-A-DAY WOMENS PO) Take 1 tablet by mouth daily.    Marland Kitchen OVER THE COUNTER MEDICATION Take 1 capsule by mouth daily. Biotin/MSM    . prochlorperazine (COMPAZINE) 10 MG tablet Take 1 tablet (10 mg total) by mouth every 6 (six) hours as needed for nausea or vomiting. 30 tablet 1  . traMADol (ULTRAM) 50 MG tablet Take 1 tablet (50 mg total) by mouth every 6 (six) hours as needed. 15 tablet 1  . ALPRAZolam (XANAX) 0.25 MG tablet Take 1 tablet (0.25 mg total) by mouth at bedtime as needed for anxiety. 20 tablet 0   Current Facility-Administered Medications  Medication Dose Route Frequency Provider Last Rate Last Dose  . LORazepam (ATIVAN) injection 0.5 mg  0.5 mg Intravenous Once [name], MD       Facility-Administered Medications Ordered in Other Visits  Medication Dose Route Frequency Provider Last Rate Last Dose  . heparin lock flush 100 unit/mL  500 Units Intracatheter Once PRN [name], MD      . PACLitaxel-protein bound (ABRAXANE) chemo infusion 150 mg  100 mg/m2 (Treatment Plan Recorded) Intravenous Once [name], MD      . prochlorperazine (COMPAZINE) tablet 10 mg  10 mg Oral Once [name], MD      . sodium chloride flush (NS) 0.9 % injection 10 mL  10 mL Intravenous PRN [name], MD   10 mL at 07/21/17 1323  . sodium chloride flush (NS) 0.9 % injection 10 mL  10 mL Intracatheter PRN [name], MD        REVIEW OF SYSTEMS:   Constitutional: Denies fevers, chills or abnormal night sweats (+) mild-moderate insomnia   Eyes: Denies blurriness of vision, double vision or watery eyes Ears, nose, mouth, throat, and face: Denies mucositis  or sore throat  Respiratory: Denies dyspnea or wheezes    Cardiovascular: Denies palpitation, chest discomfort or lower extremity swelling Gastrointestinal:  Denies nausea, heartburn or change in bowel habits Skin: Denies abnormal skin rashes (+) palpable boil in right axilla  Lymphatics: Denies new lymphadenopathy or easy bruising Breast: (+) pinching in chest post surgery Neurological:Denies numbness, tingling or new weaknesses Behavioral/Psych: Mood is stable, no new changes  All other systems were reviewed with the patient and are negative.   PHYSICAL EXAMINATION: ECOG PERFORMANCE STATUS: 0 - Asymptomatic  Vitals:   11/03/17 0840  BP: 109/65  Pulse: 78  Resp: 18  Temp: 97.6 F (36.4 C)  SpO2: 100%   Filed Weights   11/03/17 0840  Weight: 130 lb 1.6 oz (59 kg)     GENERAL:alert, no distress and comfortable SKIN: skin color, texture, turgor are normal, no rashes or significant lesions (+) a very small boil in right axilla  EYES: normal, conjunctiva are pink and non-injected, sclera clear OROPHARYNX:no exudate, no erythema and lips, buccal mucosa, and tongue normal  NECK: supple, thyroid normal size, non-tender, without nodularity LYMPH:  no palpable lymphadenopathy in the cervical, axillary or inguinal LUNGS: clear to auscultation and percussion with normal breathing effort HEART: regular rate & rhythm and no murmurs and no lower extremity edema ABDOMEN:abdomen soft, non-tender and normal bowel sounds Musculoskeletal:no cyanosis of digits and no clubbing  PSYCH: alert & oriented x 3 with fluent speech NEURO: no focal motor/sensory deficits Breast: Status post bilateral mastectomy and tissue expander placements, surgical incisions have healed well, no discharge or skin erythema.   LABORATORY DATA:  I have reviewed the data as listed CBC Latest Ref Rng & Units 11/03/2017 10/27/2017 10/20/2017  WBC 3.9 - 10.3 K/uL 4.4 3.4(L) 4.1  Hemoglobin 11.6 - 15.9 g/dL 12.0 12.2 12.4  Hematocrit 34.8 - 46.6 % 36.2 36.4 36.1  Platelets 145 -  400 K/uL 198 185 242    CMP Latest Ref Rng & Units 11/03/2017 10/27/2017 10/20/2017  Glucose 70 - 140 mg/dL 96 87 99  BUN 7 - 26 mg/dL _0 Creatinine 0.60 - 1.10 mg/dL 0.84 0.82 0.92  Sodium 136 - 145 mmol/L 139 142 142  Potassium 3.5 - 5.1 mmol/L 4.2 4.1 4.0  Chloride 98 - 109 mmol/L 107 107 108  CO2 22 - 29 mmol/L _1 Calcium 8.4 - 10.4 mg/dL 9.3 9.6 9.2  Total Protein 6.4 - 8.3 g/dL 6.1(L) 6.2(L) 6.3(L)  Total Bilirubin 0.2 - 1.2 mg/dL <0.2(L) 0.2 <0.2(L)  Alkaline Phos 40 - 150 U/L 65 68 73  AST 5 - 34 U/L _2 ALT 0 - 55 U/L _3 PATHOLOGY   Diagnosis  04/27/17 1. Breast, simple mastectomy, Right - FIBROCYSTIC CHANGES WITH ADENOSIS. - USUAL DUCTAL HYPERPLASIA. - HEALING BIOPSY SITE. - THERE IS NO EVIDENCE OF MALIGNANCY. 2. Breast, simple mastectomy, Left - INVASIVE DUCTAL CARCINOMA, GRADE II/III, SPANNING 1.2 CM. - DUCTAL CARCINOMA IN SITU WITH CALCIFICATIONS, HIGH GRADE. - INVASIVE DUCTAL CARCINOMA IS FOCALLY PRESENT AT THE ANTERIOR MARGIN. - DUCTAL CARCINOMA IN SITU IS BROADLY 0.1 CM TO THE POSTERIOR MARGIN. - SEE ONCOLOGY TABLE BELOW. 3. Lymph nodes, regional resection, Left axillary - METASTATIC CARCINOMA IN 8 OF 21 LYMPH NODES (8/21). - SEE COMMENT. Microscopic Comment 2. BREAST, INVASIVE TUMOR Procedure: Simple mastectomy and axillary lymph node resections. Laterality: Left. Tumor Size: 1.2 cm (glass slide measurement). Histologic Type: Ductal Grade: II Tubular Differentiation: 3 Nuclear  Pleomorphism: 3 Mitotic Count: 1 Ductal Carcinoma in Situ (DCIS): Present, high grade, extensive. Extent of Tumor: Confined to breast parenchyma. Margins: Invasive carcinoma, distance from closest margin: Focally present at the anterior margin. DCIS, distance from closest margin: Broadly less than 0.1 cm to the posterior margin. Regional Lymph Nodes: Number of Lymph Nodes Examined: 21 1 of 4 FINAL for Madison Savage, Madison Savage [phone]) Microscopic  Comment(continued) Lymph Nodes with Macrometastases: 6 Lymph Nodes with Micrometastases: 0 Lymph Nodes with Isolated Tumor Cells: 2 Breast Prognostic Profile: [id number] Estrogen Receptor: 95%, strong. Progesterone Receptor: 5%, strong. Her2: No amplification was detected. The ratio is 1.23. Ki-67: 15%. Best tumor block for sendout testing: 2G Pathologic Stage Classification (pTNM, AJCC 8th Edition): Primary Tumor (pT): pT1c Regional Lymph Nodes (pN): pN2a Distant Metastases (pM): pMX Comments: Grossly, there is a 2.5 cm focus of indurated tissue identified. Histologic examination of this area reveals predominately high grade ductal carcinoma in situ with calcifications. There is associated invasive ductal carcinoma, which is E-cadherin positive, spanning 1.2 cm. (JBK:gt, 05/02/17) 3. Immunohistochemical stains for cytokeratin AE1/AE3 performed on multiple blocks on part 3 help highlight the presence of metastatic carcinoma. (JBK:gt, 05/02/17)   Diagnosis 03/27/17 1. Breast, left, needle core biopsy -FIBROADENOMATOID NODULE WITH CALCIFICATIONS 2.Breast, left, needle core biopsy -DUCTAL CARCINOMA IN SITU, HIGH GRADE -SEE COMMENT  3. Breast, right, needle, core biopsy -FIBROADENOMA -NO MALIGNANCY IDENTIFIED  Diagnosis 03/22/17 Lymph node, needle/core biopsy, left axillary node -METASTATIC CARCINOMA, SEE COMMENT Microscopic Comment Sections showed lymph node tissue mostly replaced by metastatic carcinoma characterized by sheets of epithelioid appearing cells displaying milder to moderate nuclear pleomorphism, fine chromatin, small nucleoli, and moderately abundant amphophilic to eosinophilic cyto-plasma. Immunohistochemistry staining showed positive for cytokeratin AE1/AE3, GATA-3 and estrogen receptor, with patchy positivity for cytokeratin 7 and GCDFP., Only scattered cells showed progesterone positivity. Negative for S100, melan-a, E-cadherin, cytokeratin 20, CDX-2, TTF-1 or WT-1.  The findings are consistent with metastatic carcinoma of breast primary. Lack of significant e-cadherin positivity suggested lobular carcinoma.   GENETICS 04/10/17    PROCEDURES   ECHO 05/31/17 Study Conclusions - Left ventricle: The cavity size was normal. Wall thickness was   normal. Systolic function was normal. The estimated ejection   fraction was in the range of 55% to 60%. Wall motion was normal;   there were no regional wall motion abnormalities. Impressions: - Normal LV systolic and diastolic function; global longitudinal   strain - 17.9%.  RADIOGRAPHIC STUDIES: I have personally reviewed the radiological images as listed and agreed with the findings in the report.  CT CAP 04/07/17 IMPRESSION: 1. Several prominent left axillary lymph nodes which may correspond to biopsy proven axillary lymph node metastasis. 2. No thoracic adenopathy identified or evidence of distant metastatic disease.   Bone whole body scan 04/07/17 IMPRESSION: Negative for evidence of osseous metastatic disease.  Diagnostic mammogram bilateral 03/21/17 IMPRESSION: INCOMPLETE - ADDITIONAL IMAGING EVALUATION NEEDED 1. The 0.6 cm oval mass in the right breast at 11 o'clock middle depth is indeterminate. An ultrasound is recommended.  2. The 0.5cm round focal asymmetry in the left breast central to the nipple middle depth is indeterminate. An ultrasound is recommended.  3. Ultrasound of the palpable abnormalities in the axilla and far left lateral breast is recommended.  4. Multiple clusters of pleomorphic calcifications in the left upper outer breast anterior depth spanning a 2.5 cm area are highly suggestive of malignancy. A stereotactic biopsy is recommended.   US Breast bilateral 03/21/17 IMPRESSION: HIGHLY SUGGESTIVE OF MALIGNANCY 1.  Six distinct abnormal left axillary nodes, 2 of which correspond to the palpable lumps felt by the patient. Findings concerning for metastatic adenopathy. Ultrasound  guided biopsy of one of these nodes is recommended.  2. Small 5 mm palpable, oval mass in the left breast 3:00 areolar margin is at an intermediate suspicion. Ultrasound guided biopsy is recommended.  3. Isoechoic 8 mm mass in the right breast 10:00 5 cm from the nipple is also an intermediate suspicion for malignancy, and ultrasound guided biopsy is recommended.  4. Stereotactic guided biopsy for the highly suspicious calcifications in the left breast also recommended as detailed in the mammography report.    ASSESSMENT & PLAN:  Madison Savage is a 47 y.o. perimenopausal female with a history of Chronic ITP and MTHFR mutation, presented with a palpable left axillary adenopathy.  1. 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 She had a 1.2 cm left breast invasive ductal carcinoma, with 8 positive lymph nodes. Surgical margins were negative. -her staging CT and bone scan was negative --She started Adjuvant AC on 06/09/17, tolerating well so far, some fatigue, improves with IVF on week 2. Pt has been using Cold cap and it has been working for her thus far.  -She agreed to weekly Abraxane and Carboplatin and will continue Cold Cap. Due to worsened cold symptoms she started on 08/11/17.  -She did not tolerate the first cycle Carbo and Abraxane well.  Carboplatin has been held from cycle 2, she is tolerating weekly Abraxane very well. -She is finishing adjuvant Abraxane in 2 weeks.  She plan to have bilateral implant reconstruction in early April.  Radiation will start after her surgery. --Given her ER positive disease and high risk for recurrence, I strongly recommend adjuvant anti-estrogen therapy.  Given her young age,  high risk disease, I strongly recommend her to consider ovarian suppression injection with an Aromatase inhibitor. She will have a full hysterectomy in 6-8 months. I discussed side effects and the benefit of using an AI over tamoxifen. She is agreeable, will start Zoladex in 2 weeks and plan to start Letrozole after Breast reconstruction surgery on 12/06/17.  -She is tolerating treatment well, mild fatigue, labs reviewed today (11/03/17) are WNL. Pt can continue with cycle 10 of Abraxane today  -F/u in 2 weeks for last cycle of Abraxane.   2. Chronic ITP  -Discovered in her Last pregnancy, with platelet count dropped to between 50,000 and 60,000, recovered, her platelet counts is around 120-150K lately  -She does not follow up with hematologist  -I'm concerned her platelet counts will drop significantly with chemotherapy, she may need steroids or additional treatment for her ITP. -Her platelets have overall remained stable while on chemotherapy.   3. 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 -She has had bilateral mastectomy, due to the high-risk of breast cancer -She planned to have hysterectomy and BSO after she completes chemo, radiation and breast reconstruction -Patient has met our genetic counselor  Mrs. Florene Glen, BRCA2 G mutation screening in her children and siblings were recommended.    4. Chest pain, secondary to [facility] and [facility] -Managed with Norco as needed, much better now  -Manageable, continue monitoring   5. Insomnia and Anxiety -I recommended her to use Benadryl and 10 mg melatonin OTC as needed. She should avoid caffeine  or high stimulus at night.  -I last filled [place] in October 2018 and she is requesting a refill before she starts radiation. Her anxiety is mild-moderate, I filled Xanax today. She can take 0.5-1 tablet as needed for anxiety/sleep.    PLAN:  -Continue weekly Abraxane cycle 10 today and continue weekly for 2 more weeks -F/u in 2 weeks with lab for last cycle of Abraxane, she will start Zoladex injection in 2 weeks  -filled Xanax today for anxiety/sleep PRN -F/u with Dr. Lisbeth Renshaw after chemo, will refer back to him. Plan to start after reconstruction surgery in April    No orders of the defined types were placed in this encounter.   All questions were answered. The patient knows to call the clinic with any problems, questions or concerns. I spent 25 minutes counseling the patient face to face. The total time spent in the appointment was 30 minutes and more than 50% was on counseling.  This document serves as a record of services personally performed by [name], MD. It was created on her behalf by Theresia Bough, a trained medical scribe. The creation of this record is based on the scribe's personal observations and the provider's statements to them.   I have reviewed the above documentation for accuracy and completeness, and I agree with the above.     [name], MD 11/03/2017 10:33 AM

## 2017-11-03 ENCOUNTER — Inpatient Hospital Stay: Payer: BLUE CROSS/BLUE SHIELD

## 2017-11-03 ENCOUNTER — Encounter: Payer: Self-pay | Admitting: *Deleted

## 2017-11-03 ENCOUNTER — Encounter: Payer: Self-pay | Admitting: Hematology

## 2017-11-03 ENCOUNTER — Inpatient Hospital Stay (HOSPITAL_BASED_OUTPATIENT_CLINIC_OR_DEPARTMENT_OTHER): Payer: BLUE CROSS/BLUE SHIELD | Admitting: Hematology

## 2017-11-03 ENCOUNTER — Inpatient Hospital Stay: Payer: BLUE CROSS/BLUE SHIELD | Attending: Hematology

## 2017-11-03 ENCOUNTER — Other Ambulatory Visit: Payer: Self-pay

## 2017-11-03 VITALS — BP 109/65 | HR 78 | Temp 97.6°F | Resp 18 | Ht 67.5 in | Wt 130.1 lb

## 2017-11-03 DIAGNOSIS — C773 Secondary and unspecified malignant neoplasm of axilla and upper limb lymph nodes: Principal | ICD-10-CM

## 2017-11-03 DIAGNOSIS — C50912 Malignant neoplasm of unspecified site of left female breast: Secondary | ICD-10-CM

## 2017-11-03 DIAGNOSIS — G4700 Insomnia, unspecified: Secondary | ICD-10-CM | POA: Diagnosis not present

## 2017-11-03 DIAGNOSIS — D693 Immune thrombocytopenic purpura: Secondary | ICD-10-CM | POA: Diagnosis not present

## 2017-11-03 DIAGNOSIS — Z1509 Genetic susceptibility to other malignant neoplasm: Secondary | ICD-10-CM | POA: Diagnosis not present

## 2017-11-03 DIAGNOSIS — Z5111 Encounter for antineoplastic chemotherapy: Secondary | ICD-10-CM | POA: Insufficient documentation

## 2017-11-03 DIAGNOSIS — Z1501 Genetic susceptibility to malignant neoplasm of breast: Secondary | ICD-10-CM | POA: Diagnosis not present

## 2017-11-03 DIAGNOSIS — F419 Anxiety disorder, unspecified: Secondary | ICD-10-CM | POA: Diagnosis not present

## 2017-11-03 DIAGNOSIS — C50412 Malignant neoplasm of upper-outer quadrant of left female breast: Secondary | ICD-10-CM | POA: Insufficient documentation

## 2017-11-03 DIAGNOSIS — Z95828 Presence of other vascular implants and grafts: Secondary | ICD-10-CM

## 2017-11-03 LAB — CBC WITH DIFFERENTIAL/PLATELET
Basophils Absolute: 0 10*3/uL (ref 0.0–0.1)
Basophils Relative: 1 %
Eosinophils Absolute: 0.1 10*3/uL (ref 0.0–0.5)
Eosinophils Relative: 2 %
HCT: 36.2 % (ref 34.8–46.6)
Hemoglobin: 12 g/dL (ref 11.6–15.9)
Lymphocytes Relative: 25 %
Lymphs Abs: 1.1 10*3/uL (ref 0.9–3.3)
MCH: 31.6 pg (ref 25.1–34.0)
MCHC: 33.2 g/dL (ref 31.5–36.0)
MCV: 95.3 fL (ref 79.5–101.0)
Monocytes Absolute: 0.4 10*3/uL (ref 0.1–0.9)
Monocytes Relative: 9 %
Neutro Abs: 2.8 10*3/uL (ref 1.5–6.5)
Neutrophils Relative %: 63 %
Platelets: 198 10*3/uL (ref 145–400)
RBC: 3.8 MIL/uL (ref 3.70–5.45)
RDW: 14.6 % — ABNORMAL HIGH (ref 11.2–14.5)
WBC: 4.4 10*3/uL (ref 3.9–10.3)

## 2017-11-03 LAB — COMPREHENSIVE METABOLIC PANEL
ALT: 15 U/L (ref 0–55)
AST: 14 U/L (ref 5–34)
Albumin: 3.7 g/dL (ref 3.5–5.0)
Alkaline Phosphatase: 65 U/L (ref 40–150)
Anion gap: 8 (ref 3–11)
BUN: 11 mg/dL (ref 7–26)
CO2: 24 mmol/L (ref 22–29)
Calcium: 9.3 mg/dL (ref 8.4–10.4)
Chloride: 107 mmol/L (ref 98–109)
Creatinine, Ser: 0.84 mg/dL (ref 0.60–1.10)
GFR calc Af Amer: >60 mL/min (ref >60)
GFR calc non Af Amer: >60 mL/min (ref >60)
Glucose, Bld: 96 mg/dL (ref 70–140)
Potassium: 4.2 mmol/L (ref 3.5–5.1)
Sodium: 139 mmol/L (ref 136–145)
Total Bilirubin: <0.2 mg/dL — ABNORMAL LOW (ref 0.2–1.2)
Total Protein: 6.1 g/dL — ABNORMAL LOW (ref 6.4–8.3)

## 2017-11-03 MED ORDER — SODIUM CHLORIDE 0.9 % IV SOLN
Freq: Once | INTRAVENOUS | Status: AC
  Administered 2017-11-03: 10:00:00 via INTRAVENOUS

## 2017-11-03 MED ORDER — PROCHLORPERAZINE MALEATE 10 MG PO TABS: 10.0000 mg | ORAL_TABLET | Freq: Once | ORAL | Status: DC

## 2017-11-03 MED ORDER — ALPRAZOLAM 0.25 MG PO TABS: 0.2500 mg | ORAL_TABLET | Freq: Every evening | ORAL | 0 refills | Status: DC | PRN

## 2017-11-03 MED ORDER — SODIUM CHLORIDE 0.9% FLUSH
10.0000 mL | Freq: Once | INTRAVENOUS | Status: AC
  Administered 2017-11-03: 10 mL
  Filled 2017-11-03: qty 10

## 2017-11-03 MED ORDER — HEPARIN SOD (PORK) LOCK FLUSH 100 UNIT/ML IV SOLN
500.0000 [IU] | Freq: Once | INTRAVENOUS | Status: AC | PRN
  Administered 2017-11-03: 500 [IU]
  Filled 2017-11-03: qty 5

## 2017-11-03 MED ORDER — PACLITAXEL PROTEIN-BOUND CHEMO INJECTION 100 MG
100.0000 mg/m2 | Freq: Once | INTRAVENOUS | Status: AC
  Administered 2017-11-03: 150 mg via INTRAVENOUS
  Filled 2017-11-03: qty 30

## 2017-11-03 MED ORDER — SODIUM CHLORIDE 0.9% FLUSH
10.0000 mL | INTRAVENOUS | Status: DC | PRN
Start: 2017-11-03 — End: 2017-11-03
  Administered 2017-11-03: 10 mL
  Filled 2017-11-03: qty 10

## 2017-11-06 ENCOUNTER — Encounter: Payer: Self-pay | Admitting: Radiation Oncology

## 2017-11-10 ENCOUNTER — Inpatient Hospital Stay: Payer: BLUE CROSS/BLUE SHIELD

## 2017-11-10 VITALS — BP 92/55 | HR 84 | Temp 98.2°F | Resp 18

## 2017-11-10 DIAGNOSIS — C773 Secondary and unspecified malignant neoplasm of axilla and upper limb lymph nodes: Principal | ICD-10-CM

## 2017-11-10 DIAGNOSIS — C50912 Malignant neoplasm of unspecified site of left female breast: Secondary | ICD-10-CM

## 2017-11-10 DIAGNOSIS — Z5111 Encounter for antineoplastic chemotherapy: Secondary | ICD-10-CM | POA: Diagnosis not present

## 2017-11-10 DIAGNOSIS — Z95828 Presence of other vascular implants and grafts: Secondary | ICD-10-CM

## 2017-11-10 DIAGNOSIS — C50412 Malignant neoplasm of upper-outer quadrant of left female breast: Secondary | ICD-10-CM | POA: Diagnosis not present

## 2017-11-10 LAB — COMPREHENSIVE METABOLIC PANEL
ALT: 13 U/L (ref 0–55)
AST: 15 U/L (ref 5–34)
Albumin: 3.8 g/dL (ref 3.5–5.0)
Alkaline Phosphatase: 64 U/L (ref 40–150)
Anion gap: 7 (ref 3–11)
BUN: 14 mg/dL (ref 7–26)
CO2: 25 mmol/L (ref 22–29)
Calcium: 9.3 mg/dL (ref 8.4–10.4)
Chloride: 109 mmol/L (ref 98–109)
Creatinine, Ser: 0.83 mg/dL (ref 0.60–1.10)
GFR calc Af Amer: >60 mL/min (ref >60)
GFR calc non Af Amer: >60 mL/min (ref >60)
Glucose, Bld: 96 mg/dL (ref 70–140)
Potassium: 4 mmol/L (ref 3.5–5.1)
Sodium: 141 mmol/L (ref 136–145)
Total Bilirubin: 0.2 mg/dL (ref 0.2–1.2)
Total Protein: 6 g/dL — ABNORMAL LOW (ref 6.4–8.3)

## 2017-11-10 LAB — CBC WITH DIFFERENTIAL/PLATELET
Basophils Absolute: 0 10*3/uL (ref 0.0–0.1)
Basophils Relative: 1 %
Eosinophils Absolute: 0.1 10*3/uL (ref 0.0–0.5)
Eosinophils Relative: 2 %
HCT: 36 % (ref 34.8–46.6)
Hemoglobin: 12.1 g/dL (ref 11.6–15.9)
Lymphocytes Relative: 25 %
Lymphs Abs: 1 10*3/uL (ref 0.9–3.3)
MCH: 31.4 pg (ref 25.1–34.0)
MCHC: 33.6 g/dL (ref 31.5–36.0)
MCV: 93.5 fL (ref 79.5–101.0)
Monocytes Absolute: 0.3 10*3/uL (ref 0.1–0.9)
Monocytes Relative: 7 %
Neutro Abs: 2.5 10*3/uL (ref 1.5–6.5)
Neutrophils Relative %: 65 %
Platelets: 196 10*3/uL (ref 145–400)
RBC: 3.85 MIL/uL (ref 3.70–5.45)
RDW: 14.1 % (ref 11.2–14.5)
WBC: 3.8 10*3/uL — ABNORMAL LOW (ref 3.9–10.3)

## 2017-11-10 MED ORDER — PROCHLORPERAZINE MALEATE 10 MG PO TABS: 10.0000 mg | ORAL_TABLET | Freq: Once | ORAL | Status: DC

## 2017-11-10 MED ORDER — PACLITAXEL PROTEIN-BOUND CHEMO INJECTION 100 MG
100.0000 mg/m2 | Freq: Once | INTRAVENOUS | Status: AC
  Administered 2017-11-10: 150 mg via INTRAVENOUS
  Filled 2017-11-10: qty 30

## 2017-11-10 MED ORDER — SODIUM CHLORIDE 0.9% FLUSH
10.0000 mL | Freq: Once | INTRAVENOUS | Status: AC
  Administered 2017-11-10: 10 mL
  Filled 2017-11-10: qty 10

## 2017-11-10 MED ORDER — SODIUM CHLORIDE 0.9 % IV SOLN
Freq: Once | INTRAVENOUS | Status: AC
  Administered 2017-11-10: 09:00:00 via INTRAVENOUS

## 2017-11-10 MED ORDER — SODIUM CHLORIDE 0.9% FLUSH
10.0000 mL | INTRAVENOUS | Status: DC | PRN
  Administered 2017-11-10: 10 mL
  Filled 2017-11-10: qty 10

## 2017-11-10 MED ORDER — HEPARIN SOD (PORK) LOCK FLUSH 100 UNIT/ML IV SOLN
500.0000 [IU] | Freq: Once | INTRAVENOUS | Status: AC | PRN
  Administered 2017-11-10: 500 [IU]
  Filled 2017-11-10: qty 5

## 2017-11-10 NOTE — Patient Instructions (Signed)
[facility] Discharge Instructions for Patients Receiving Chemotherapy  Today you received the following chemotherapy agents:  Abraxane.  To help prevent nausea and vomiting after your treatment, we encourage you to take your nausea medication as directed.   If you develop nausea and vomiting that is not controlled by your nausea medication, call the clinic.   BELOW ARE SYMPTOMS THAT SHOULD BE REPORTED IMMEDIATELY:  *FEVER GREATER THAN 100.5 F  *CHILLS WITH OR WITHOUT FEVER  NAUSEA AND VOMITING THAT IS NOT CONTROLLED WITH YOUR NAUSEA MEDICATION  *UNUSUAL SHORTNESS OF BREATH  *UNUSUAL BRUISING OR BLEEDING  TENDERNESS IN MOUTH AND THROAT WITH OR WITHOUT PRESENCE OF ULCERS  *URINARY PROBLEMS  *BOWEL PROBLEMS  UNUSUAL RASH Items with * indicate a potential emergency and should be followed up as soon as possible.  Feel free to call the clinic should you have any questions or concerns. The clinic phone number is [phone].  Please show the CHEMO ALERT CARD at check-in to the Emergency Department and triage nurse.   

## 2017-11-14 ENCOUNTER — Encounter: Payer: Self-pay | Admitting: *Deleted

## 2017-11-14 NOTE — Progress Notes (Signed)
[place]  [phone] [phone]  Clinic Follow up Note   Patient Care Team: Dorena Cookey, MD as PCP - [name], MD as Consulting Physician (General Surgery) [name], MD as Consulting Physician (Hematology) Kyung Rudd, MD as Consulting Physician (Radiation Oncology) Brien Few, MD as Consulting Physician (Obstetrics and Gynecology)   Date of Service:  11/16/2017  CHIEF COMPLAINTS:  Follow up left breast cancer   Oncology History   Cancer Staging Breast cancer metastasized to axillary lymph node, left [facility]) Staging form: Breast, AJCC 8th Edition - Clinical stage from 03/22/2017: Stage Unknown (cTX, cN1, cM0, GX, ER: Positive, PR: Positive, HER2: Negative) - Signed by [name], MD on 03/29/2017 - Pathologic stage from 04/27/2017: Stage IB (pT1c, pN2a, cM0, G2, ER: Positive, PR: Positive, HER2: Negative) - Signed by [name], MD on 05/25/2017 \     Breast cancer metastasized to axillary lymph node, left ([place])   03/21/2017 Mammogram    Diagnostic mammogram bilateral 03/21/17 IMPRESSION: INCOMPLETE - ADDITIONAL IMAGING EVALUATION NEEDED 1. The 0.6 cm oval mass in the right breast at 11 o'clock middle depth is indeterminate. An ultrasound is recommended.  2. The 0.5cm round focal asymmetry in the left breast central to the nipple middle depth is indeterminate. An ultrasound is recommended.  3. Ultrasound of the palpable abnormalities in the axilla and far left lateral breast is recommended.  4. Multiple clusters of pleomorphic calcifications in the left upper outer breast anterior depth spanning a 2.5 cm area are highly suggestive of malignancy. A stereotactic biopsy is recommended.        03/21/2017 Imaging    US Breast bilateral 03/21/17 IMPRESSION: HIGHLY SUGGESTIVE OF MALIGNANCY 1. Six distinct abnormal left axillary nodes, 2 of which correspond to the palpable lumps felt by the patient. Findings concerning for metastatic  adenopathy. Ultrasound guided biopsy of one of these nodes is recommended.  2. Small 5 mm palpable, oval mass in the left breast 3:00 areolar margin is at an intermediate suspicion. Ultrasound guided biopsy is recommended.  3. Isoechoic 8 mm mass in the right breast 10:00 5 cm from the nipple is also an intermediate suspicion for malignancy, and ultrasound guided biopsy is recommended.  4. Stereotactic guided biopsy for the highly suspicious calcifications in the left breast also recommended as detailed in the mammography report.        03/22/2017 Initial Biopsy    Diagnosis 03/22/17 Lymph node, needle/core biopsy, left axillary node -METASTATIC CARCINOMA, SEE COMMENT Microscopic Comment      03/22/2017 Receptors her2    Lymph node biopsy ER 95% positive, strong staining, PR 5% positive, weak staining, HER-2 negative, with HER2/CEP17 ratio 1.23 and copy #3.95.      03/27/2017 Pathology Results    Diagnosis 03/27/17 1. Breast, left, needle core biopsy -FIBROADENOMATOID NODULE WITH CALCIFICATIONS 2.Breast, left, needle core biopsy -DUCTAL CARCINOMA IN SITU, HIGH GRADE -SEE COMMENT  3. Breast, right, needle, core biopsy -FIBROADENOMA -NO MALIGNANCY IDENTIFIED       03/29/2017 Initial Diagnosis    Breast cancer metastasized to axillary lymph node, left ([place])      04/07/2017 Imaging    CT CAP 04/07/17 IMPRESSION: 1. Several prominent left axillary lymph nodes which may correspond to biopsy proven axillary lymph node metastasis. 2. No thoracic adenopathy identified or evidence of distant metastatic disease.      04/07/2017 Imaging    Bone whole body scan 04/07/17 IMPRESSION: Negative for evidence of osseous metastatic disease.  04/17/2017 Genetic Testing    BRCA2 c.[id number] (p.Glu260Serfs*15) pathogenic mutation identified on the 9 gene STAT panel.  The STAT Breast cancer panel offered by [facility] includes sequencing and rearrangement analysis for the following 9 genes:  ATM,  BRCA1, BRCA2, CDH1, CHEK2, PALB2, PTEN, STK11 and TP53.   The report date is April 17, 2017.  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  The Hereditary Gene Panel offered by [facility] includes sequencing and/or deletion duplication testing of the following 46 genes: APC, ATM, AXIN2, BARD1, BMPR1A, BRCA1, BRCA2, BRIP1, CDH1, CDKN2A (p14ARF), CDKN2A (p16INK4a), CHEK2, CTNNA1, DICER1, EPCAM (Deletion/duplication testing only), GREM1 (promoter region deletion/duplication testing only), KIT, MEN1, MLH1, MSH2, MSH3, MSH6, MUTYH, NBN, NF1, NHTL1, PALB2, PDGFRA, PMS2, POLD1, POLE, PTEN, RAD50, RAD51C, RAD51D, SDHB, SDHC, SDHD, SMAD4, SMARCA4. STK11, TP53, TSC1, TSC2, and VHL.  The following genes were evaluated for sequence changes only: SDHA and HOXB13 c.251G>A variant only.  The report date is April 29, 2017.       04/27/2017 Surgery    Surgey 04/27/17 BILATERAL SKIN SPARING MASTECTOMY and LEFT AXILLARY LYMPH NODE and BILATERAL BREAST RECONSTRUCTION WITH PLACEMENT OF TISSUE EXPANDER AND FLEX HD (ACELLULAR HYDRATED DERMIS) Bilateral BY Dr. Barry Dienes and Dr. Marla Roe       04/27/2017 Pathology Results     Diagnosis  04/27/17 1. Breast, simple mastectomy, Right - FIBROCYSTIC CHANGES WITH ADENOSIS. - USUAL DUCTAL HYPERPLASIA. - HEALING BIOPSY SITE. - THERE IS NO EVIDENCE OF MALIGNANCY. 2. Breast, simple mastectomy, Left - INVASIVE DUCTAL CARCINOMA, GRADE II/III, SPANNING 1.2 CM. - DUCTAL CARCINOMA IN SITU WITH CALCIFICATIONS, HIGH GRADE. - INVASIVE DUCTAL CARCINOMA IS FOCALLY PRESENT AT THE ANTERIOR MARGIN. - DUCTAL CARCINOMA IN SITU IS BROADLY 0.1 CM TO THE POSTERIOR MARGIN. - SEE ONCOLOGY TABLE BELOW. 3. Lymph nodes, regional resection, Left axillary - METASTATIC CARCINOMA IN 8 OF 21 LYMPH NODES (8/21). - SEE COMMENT      06/09/2017 - 11/16/2017 Chemotherapy    Adriamycin and Cytoxan every  2 weeks, for 4 cycles 06/09/17-07/21/17, followed by weekly Abraxane and carboplatin for 12 weeks starting 08/11/17. Held Carbo with cycle 2 due to poor toleration, after cycle 2 may change Abraxane to Taxol as her insurance has denied Abraxane. Changed to Abraxane for Cycle 3 on 09/08/17. Completed on 11/16/17        HISTORY OF PRESENTING ILLNESS: 03/29/17 Madison Savage 47 y.o. female is here because of newly diagnosed Breast cancer metastasized to axillary lymph node, left. She presents to Breast Clinic today with her husband. She felt the lump herself June 22nd. It was soft and "squishy" and it moved. First in the left breast and then four days latter in the left axilla. She feels they have gotten smaller.   In the past she was diagnosed with being ITP positive. Period has been irregular more since her last miscarriage in 2015. She has MTHFR gene. In pregnancy her plt counts have gone low. She has not seen homologist for this. All 3 children were vaginal births.  Today she reports that with the lumps she feels a dull ache that comes and goes. She has not noticed any nipple or skin change. She had not had a mammogram since she was 5. Her PCP was going to set her up with a mammogram last year. Dr. Ronita Hipps is her OB and Dr. Sherren Mocha is her current PCP. She feels no change in appetite or weight loss. She quit smoking 3 weeks  ago. She works out everyday. She reports her last attempt at the MRI, the contrast made her feel like she could not breathe, her head was pounding and she felt hot all over and she was shaking. The trouble breathing only lasted while she was in the MRI.  Husband shared concern with insurance and financial. And referrals to second opinions.    GYN HISTORY  Menarchal: 16 LMP: 03/05/2017 Contraceptive: none HRT: N/A G7P3: - 3 miscarriages, 1 abortion    CURRENT THERAPY:   Adriamycin and Cytoxan every 2 weeks, for 4 cycles 06/09/17-07/21/17, followed by weekly Abraxane and  carboplatin for 12 weeks starting 08/11/17. Held Carbo with cycle 2 due to poor toleration, after cycle 2 may change Abraxane to Taxol as her insurance has denied Abraxane. Changed to Abraxane for Cycle 3 on 09/08/17. Completed on 11/16/17    INTERVAL HISTORY:  Madison Savage is here for a follow up and treatment. She presents to the clinic today by herself. She is excited to complete her last cycle chemo today, it has been emotional. She notes last cycle she had felt very fatigued and her joints hurt for the first time since [facility] treatment. She participates in PT as needed and notes the pain from her expanders. She notes to cording in her axilla occurs day after infusion. She notes she plans to do breast reconstruction on 12/06/17 before radiation. She notes she is concerned with adjusting to her new normal. She notes she would like to decline Zoladex injections as she plans to proceed with total hysterectomy in 04/2018. She notes she had low AMH and understood. Her menopause may occur sooner. She notes her hot flashes and mood swings started 7 years ago. She plans to start radiation in 12/2017.   On review of symptoms, pt denies neuropathy and diarrhea. She notes she has gotten more SOB with a dry cough when working out or talking a lot. She can tolerate stairs fine.     MEDICAL HISTORY:  Past Medical History:  Diagnosis Date  . Anemia   . Anxiety   . Cancer ([place]) 03/2017   left breast cancer  . Chronic ITP (idiopathic thrombocytopenia) ([facility])   . Family history of breast cancer   . Family history of ovarian cancer   . Headache    last migraine...9/24  . Heart murmur    at birth.  none now  . MTHFR mutation [facility])     SURGICAL HISTORY: Past Surgical History:  Procedure Laterality Date  . AXILLARY LYMPH NODE DISSECTION Left 04/27/2017   Procedure: AXILLARY LYMPH NODE DISSECTION;  Surgeon: [name], MD;  Location: [place];  Service: General;  Laterality: Left;  .  BILATERAL TOTAL MASTECTOMY WITH AXILLARY LYMPH NODE DISSECTION Bilateral 04/27/2017   Procedure: SKIN SPARING MASTECTOMY;  Surgeon: [name], MD;  Location: [place];  Service: General;  Laterality: Bilateral;  . BREAST RECONSTRUCTION WITH PLACEMENT OF TISSUE EXPANDER AND FLEX HD (ACELLULAR HYDRATED DERMIS) Bilateral 04/27/2017   Procedure: BILATERAL BREAST RECONSTRUCTION WITH PLACEMENT OF TISSUE EXPANDER AND FLEX HD (ACELLULAR HYDRATED DERMIS);  Surgeon: Wallace Going, DO;  Location: [place];  Service: Plastics;  Laterality: Bilateral;  . BREAST SURGERY    . MASTECTOMY    . PORTACATH PLACEMENT Right 06/01/2017   Procedure: INSERTION PORT-A-CATH RIGHT SUBCLAVIAN;  Surgeon: [name], MD;  Location: [place];  Service: General;  Laterality: Right;  . WISDOM TOOTH EXTRACTION      SOCIAL HISTORY: Social  History   Socioeconomic History  . Marital status: Married    Spouse name: Not on file  . Number of children: 3  . Years of education: Not on file  . Highest education level: Not on file  Social Needs  . Financial resource strain: Not on file  . Food insecurity - worry: Not on file  . Food insecurity - inability: Not on file  . Transportation needs - medical: Not on file  . Transportation needs - non-medical: Not on file  Occupational History    Employer: UNEMPLOYED  Tobacco Use  . Smoking status: Former Smoker    Packs/day: 0.50    Years: 20.00    Pack years: 10.00    Types: Cigarettes    Last attempt to quit: 03/05/2017    Years since quitting: 0.7  . Smokeless tobacco: Never Used  Substance and Sexual Activity  . Alcohol use: No  . Drug use: No  . Sexual activity: Yes    Partners: Male    Birth control/protection: None  Other Topics Concern  . Not on file  Social History Narrative   Stay at home mom   3 children ages 59, 84, 33    FAMILY HISTORY: Family History  Problem Relation Age of Onset  . Breast cancer Paternal Aunt  93  . Ovarian cancer Paternal Aunt 44  . Cancer Paternal Grandmother        breast cancer   . Cancer Maternal Aunt        lymphoma   . Cancer Paternal Grandfather        throat cancer   . Cancer Paternal Aunt        lung cancer  . Cirrhosis Father   . AAA (abdominal aortic aneurysm) Maternal Grandfather   . Leukemia Paternal Aunt     ALLERGIES:  is allergic to clindamycin; penicillins; and codeine.  MEDICATIONS:  Current Outpatient Medications  Medication Sig Dispense Refill  . ALPRAZolam (XANAX) 0.25 MG tablet Take 1 tablet (0.25 mg total) by mouth at bedtime as needed for anxiety. 20 tablet 0  . b complex vitamins capsule Take 1 capsule by mouth daily.    . diazepam (VALIUM) 2 MG tablet Take 1 tablet (2 mg total) by mouth every 6 (six) hours as needed for anxiety or muscle spasms. 30 tablet 0  . Multiple Vitamins-Calcium (ONE-A-DAY WOMENS PO) Take 1 tablet by mouth daily.    Marland Kitchen OVER THE COUNTER MEDICATION Take 1 capsule by mouth daily. Biotin/MSM    . prochlorperazine (COMPAZINE) 10 MG tablet Take 1 tablet (10 mg total) by mouth every 6 (six) hours as needed for nausea or vomiting. 30 tablet 1  . traMADol (ULTRAM) 50 MG tablet Take 1 tablet (50 mg total) by mouth every 6 (six) hours as needed. 15 tablet 1   Current Facility-Administered Medications  Medication Dose Route Frequency Provider Last Rate Last Dose  . LORazepam (ATIVAN) injection 0.5 mg  0.5 mg Intravenous Once [name], MD       Facility-Administered Medications Ordered in Other Visits  Medication Dose Route Frequency Provider Last Rate Last Dose  . heparin lock flush 100 unit/mL  500 Units Intracatheter Once PRN [name], MD      . PACLitaxel-protein bound (ABRAXANE) chemo infusion 150 mg  90 mg/m2 (Treatment Plan Recorded) Intravenous Once [name], MD      . sodium chloride flush (NS) 0.9 % injection 10 mL  10 mL Intravenous PRN [name], MD   10  mL at 07/21/17 1323  . sodium chloride flush (NS) 0.9 %  injection 10 mL  10 mL Intracatheter PRN [name], MD        REVIEW OF SYSTEMS:   Constitutional: Denies fevers, chills or abnormal night sweats  Eyes: Denies blurriness of vision, double vision or watery eyes Ears, nose, mouth, throat, and face: Denies mucositis or sore throat  Respiratory: Denies dyspnea or wheezes (+) dry cough and SOB upon exertion  Cardiovascular: Denies palpitation, chest discomfort or lower extremity swelling Gastrointestinal:  Denies nausea, heartburn or change in bowel habits Skin: Denies abnormal skin rashes  Lymphatics: Denies new lymphadenopathy or easy bruising Breast: (+) pinching in chest post surgery from breast expanders.  Neurological:Denies numbness, tingling or new weaknesses Behavioral/Psych: Mood is stable, no new changes  All other systems were reviewed with the patient and are negative.   PHYSICAL EXAMINATION: ECOG PERFORMANCE STATUS: 0 - Asymptomatic  Vitals:   11/16/17 0846  BP: 119/69  Pulse: (!) 105  Resp: 18  Temp: 98.4 F (36.9 C)  SpO2: 100%   Filed Weights   11/16/17 0846  Weight: 128 lb 9.6 oz (58.3 kg)     GENERAL:alert, no distress and comfortable SKIN: skin color, texture, turgor are normal, no rashes or significant lesions   EYES: normal, conjunctiva are pink and non-injected, sclera clear OROPHARYNX:no exudate, no erythema and lips, buccal mucosa, and tongue normal  NECK: supple, thyroid normal size, non-tender, without nodularity LYMPH:  no palpable lymphadenopathy in the cervical, axillary or inguinal LUNGS: clear to auscultation and percussion with normal breathing effort HEART: regular rate & rhythm and no murmurs and no lower extremity edema ABDOMEN:abdomen soft, non-tender and normal bowel sounds Musculoskeletal:no cyanosis of digits and no clubbing  PSYCH: alert & oriented x 3 with fluent speech NEURO: no focal motor/sensory deficits Breast: Status post bilateral mastectomy and tissue expander placements,  surgical incisions have healed well, no discharge or skin erythema.   LABORATORY DATA:  I have reviewed the data as listed CBC Latest Ref Rng & Units 11/16/2017 11/10/2017 11/03/2017  WBC 3.9 - 10.3 K/uL 4.0 3.8(L) 4.4  Hemoglobin 11.6 - 15.9 g/dL 12.4 12.1 12.0  Hematocrit 34.8 - 46.6 % 36.8 36.0 36.2  Platelets 145 - 400 K/uL 200 196 198    CMP Latest Ref Rng & Units 11/16/2017 11/10/2017 11/03/2017  Glucose 70 - 140 mg/dL 110 96 96  BUN 7 - 26 mg/dL '15 14 11  '$ Creatinine 0.60 - 1.10 mg/dL 0.83 0.83 0.84  Sodium 136 - 145 mmol/L 139 141 139  Potassium 3.5 - 5.1 mmol/L 3.9 4.0 4.2  Chloride 98 - 109 mmol/L 108 109 107  CO2 22 - 29 mmol/L '24 25 24  '$ Calcium 8.4 - 10.4 mg/dL 9.0 9.3 9.3  Total Protein 6.4 - 8.3 g/dL 6.0(L) 6.0(L) 6.1(L)  Total Bilirubin 0.2 - 1.2 mg/dL 0.2 0.2 <0.2(L)  Alkaline Phos 40 - 150 U/L 61 64 65  AST 5 - 34 U/L '13 15 14  '$ ALT 0 - 55 U/L '14 13 15   '$ PATHOLOGY   Diagnosis  04/27/17 1. Breast, simple mastectomy, Right - FIBROCYSTIC CHANGES WITH ADENOSIS. - USUAL DUCTAL HYPERPLASIA. - HEALING BIOPSY SITE. - THERE IS NO EVIDENCE OF MALIGNANCY. 2. Breast, simple mastectomy, Left - INVASIVE DUCTAL CARCINOMA, GRADE II/III, SPANNING 1.2 CM. - DUCTAL CARCINOMA IN SITU WITH CALCIFICATIONS, HIGH GRADE. - INVASIVE DUCTAL CARCINOMA IS FOCALLY PRESENT AT THE ANTERIOR MARGIN. - DUCTAL CARCINOMA IN SITU IS BROADLY 0.1 CM TO  THE POSTERIOR MARGIN. - SEE ONCOLOGY TABLE BELOW. 3. Lymph nodes, regional resection, Left axillary - METASTATIC CARCINOMA IN 8 OF 21 LYMPH NODES (8/21). - SEE COMMENT. Microscopic Comment 2. BREAST, INVASIVE TUMOR Procedure: Simple mastectomy and axillary lymph node resections. Laterality: Left. Tumor Size: 1.2 cm (glass slide measurement). Histologic Type: Ductal Grade: II Tubular Differentiation: 3 Nuclear Pleomorphism: 3 Mitotic Count: 1 Ductal Carcinoma in Situ (DCIS): Present, high grade, extensive. Extent of Tumor: Confined to breast  parenchyma. Margins: Invasive carcinoma, distance from closest margin: Focally present at the anterior margin. DCIS, distance from closest margin: Broadly less than 0.1 cm to the posterior margin. Regional Lymph Nodes: Number of Lymph Nodes Examined: 21 1 of 4 FINAL for Madison Savage, Madison Savage [phone]) Microscopic Comment(continued) Lymph Nodes with Macrometastases: 6 Lymph Nodes with Micrometastases: 0 Lymph Nodes with Isolated Tumor Cells: 2 Breast Prognostic Profile: [id number] Estrogen Receptor: 95%, strong. Progesterone Receptor: 5%, strong. Her2: No amplification was detected. The ratio is 1.23. Ki-67: 15%. Best tumor block for sendout testing: 2G Pathologic Stage Classification (pTNM, AJCC 8th Edition): Primary Tumor (pT): pT1c Regional Lymph Nodes (pN): pN2a Distant Metastases (pM): pMX Comments: Grossly, there is a 2.5 cm focus of indurated tissue identified. Histologic examination of this area reveals predominately high grade ductal carcinoma in situ with calcifications. There is associated invasive ductal carcinoma, which is E-cadherin positive, spanning 1.2 cm. (JBK:gt, 05/02/17) 3. Immunohistochemical stains for cytokeratin AE1/AE3 performed on multiple blocks on part 3 help highlight the presence of metastatic carcinoma. (JBK:gt, 05/02/17)   Diagnosis 03/27/17 1. Breast, left, needle core biopsy -FIBROADENOMATOID NODULE WITH CALCIFICATIONS 2.Breast, left, needle core biopsy -DUCTAL CARCINOMA IN SITU, HIGH GRADE -SEE COMMENT  3. Breast, right, needle, core biopsy -FIBROADENOMA -NO MALIGNANCY IDENTIFIED  Diagnosis 03/22/17 Lymph node, needle/core biopsy, left axillary node -METASTATIC CARCINOMA, SEE COMMENT Microscopic Comment Sections showed lymph node tissue mostly replaced by metastatic carcinoma characterized by sheets of epithelioid appearing cells displaying milder to moderate nuclear pleomorphism, fine chromatin, small nucleoli, and moderately abundant  amphophilic to eosinophilic cyto-plasma. Immunohistochemistry staining showed positive for cytokeratin AE1/AE3, GATA-3 and estrogen receptor, with patchy positivity for cytokeratin 7 and GCDFP., Only scattered cells showed progesterone positivity. Negative for S100, melan-a, E-cadherin, cytokeratin 20, CDX-2, TTF-1 or WT-1. The findings are consistent with metastatic carcinoma of breast primary. Lack of significant e-cadherin positivity suggested lobular carcinoma.   GENETICS 04/10/17    PROCEDURES   ECHO 05/31/17 Study Conclusions - Left ventricle: The cavity size was normal. Wall thickness was   normal. Systolic function was normal. The estimated ejection   fraction was in the range of 55% to 60%. Wall motion was normal;   there were no regional wall motion abnormalities. Impressions: - Normal LV systolic and diastolic function; global longitudinal   strain - 17.9%.  RADIOGRAPHIC STUDIES: I have personally reviewed the radiological images as listed and agreed with the findings in the report.  CT CAP 04/07/17 IMPRESSION: 1. Several prominent left axillary lymph nodes which may correspond to biopsy proven axillary lymph node metastasis. 2. No thoracic adenopathy identified or evidence of distant metastatic disease.   Bone whole body scan 04/07/17 IMPRESSION: Negative for evidence of osseous metastatic disease.  Diagnostic mammogram bilateral 03/21/17 IMPRESSION: INCOMPLETE - ADDITIONAL IMAGING EVALUATION NEEDED 1. The 0.6 cm oval mass in the right breast at 11 o'clock middle depth is indeterminate. An ultrasound is recommended.  2. The 0.5cm round focal asymmetry in the left breast central to the nipple middle depth is indeterminate. An ultrasound is  recommended.  3. Ultrasound of the palpable abnormalities in the axilla and far left lateral breast is recommended.  4. Multiple clusters of pleomorphic calcifications in the left upper outer breast anterior depth spanning a 2.5 cm area  are highly suggestive of malignancy. A stereotactic biopsy is recommended.   US Breast bilateral 03/21/17 IMPRESSION: HIGHLY SUGGESTIVE OF MALIGNANCY 1. Six distinct abnormal left axillary nodes, 2 of which correspond to the palpable lumps felt by the patient. Findings concerning for metastatic adenopathy. Ultrasound guided biopsy of one of these nodes is recommended.  2. Small 5 mm palpable, oval mass in the left breast 3:00 areolar margin is at an intermediate suspicion. Ultrasound guided biopsy is recommended.  3. Isoechoic 8 mm mass in the right breast 10:00 5 cm from the nipple is also an intermediate suspicion for malignancy, and ultrasound guided biopsy is recommended.  4. Stereotactic guided biopsy for the highly suspicious calcifications in the left breast also recommended as detailed in the mammography report.    ASSESSMENT & PLAN:  TALIBAH COLASURDO is a 47 y.o. perimenopausal female with a history of Chronic ITP and MTHFR mutation, presented with a palpable left axillary adenopathy.  1. 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 She had a 1.2 cm left breast invasive ductal carcinoma, with 8 positive lymph nodes. Surgical margins were negative. -her staging CT and bone scan was negative --She started Adjuvant AC on 06/09/17, tolerating well so far, some fatigue, improves with IVF on week 2. Pt has been using Cold cap and it has been working for her thus far.  -She agreed to weekly Abraxane and Carboplatin and will continue Cold Cap. Due to worsened cold symptoms she started on 08/11/17.  -She did not tolerate the first cycle Carbo and Abraxane well. Carboplatin has been held from cycle 2, she is tolerating  weekly Abraxane very well. -She is finishing adjuvant Abraxane today.  Lab reviewed, adequate for treatment.  Given her significant fatigue and body pain from last cycle, I will reduce her Abraxane dose to 80 mg/m for today.  - She plan to have bilateral implant reconstruction on 12/06/17, plan to remove her port with the surgery.  -Radiation will start after her surgery. --Given her ER positive disease and high risk for recurrence, I strongly recommend adjuvant anti-estrogen therapy. Given her young age, high risk disease, I strongly recommend her to consider ovarian suppression injection with an Aromatase inhibitor. I discussed side effects and the benefit of using an AI over tamoxifen. She is reluctant to start ovarian suppression now, we will hold it today.  She agrees to start tamoxifen after her breast reconstruction surgery.   -She plans to have BSO in the fall 2019, I will switch her tamoxifen to aromatase inhibitor after her BSO  -she has some anxiety related to her finishing chemo. I will recommend Survivorship clinic in the next few weeks to help her adjustment before next office visit.  -Labs reviewed and adequate to proceed with last cycle Abraxane today and reduce to 80 mg/m2.  -F/u with Dr. Lisbeth Renshaw after chemo.  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 -She does not  follow up with hematologist  -I was concerned her platelet counts will drop significantly with chemotherapy, she may need steroids or additional treatment for her ITP. -Her platelets have overall remained stable while on chemotherapy.   3. 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 -She underwent genetic testing for her breast cancer, which showed a pathological BRCA2 mutation and NF1 VUS  mutation -We discussed that BRCA2 mutation predicts significant high risk of breast cancer and colon cancer, and a prophylactic surgeries are recommended. -She has had bilateral mastectomy, due to the high-risk of breast cancer -She planned to have hysterectomy and BSO after she completes chemo, radiation and breast reconstruction -Patient has met our genetic counselor Mrs. Florene Glen, BRCA2 G mutation screening in her children and siblings were recommended.    4. 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 Insomnia and Anxiety  -I recommended her to use Benadryl and 10 mg melatonin OTC as needed. She should avoid caffeine or high stimulus at night.  -I last filled Ativan in October 2018. Her anxiety is mild-moderate, I filled Xanax on 11/03/17. She can take 0.5-1 tablet as needed for anxiety/sleep.  -I discussed post-treatment resources to help her adjust to her new normal. She is very interested in survivorship clinic.  I will refer her to be seen in the next month.    PLAN:  -Labs reviewed and adequately to proceed with last cycle Abraxane, reduced to 80 mg/m2 -Survivorship clinic in 2 weeks with NP Mendel Ryder to help her post-chemo adjustment  -Lab and f/u on 4/11 -Patient declined ovarian suppression for now, will likely start tamoxifen after her breast reconstruction surgery or radiation.    No orders of the defined types were placed in this encounter.   All questions were answered. The patient knows to call the clinic with any problems, questions or concerns. I spent 25 minutes counseling the patient face to face. The total time spent in the appointment was 30 minutes and more than 50% was on counseling.  This document serves as a record of services personally performed by [name], MD. It was created on her behalf by Joslyn Devon, a trained medical scribe. The creation of this record is based on  the scribe's personal observations and the provider's statements to them.    I have reviewed the above documentation for accuracy and completeness, and I agree with the above.    [name], MD 11/16/2017 11:01 AM

## 2017-11-15 ENCOUNTER — Ambulatory Visit: Payer: BLUE CROSS/BLUE SHIELD | Attending: General Surgery | Admitting: Physical Therapy

## 2017-11-15 ENCOUNTER — Encounter: Payer: Self-pay | Admitting: Physical Therapy

## 2017-11-15 DIAGNOSIS — M25611 Stiffness of right shoulder, not elsewhere classified: Secondary | ICD-10-CM | POA: Diagnosis not present

## 2017-11-15 DIAGNOSIS — Z483 Aftercare following surgery for neoplasm: Secondary | ICD-10-CM | POA: Insufficient documentation

## 2017-11-15 DIAGNOSIS — M25511 Pain in right shoulder: Secondary | ICD-10-CM | POA: Insufficient documentation

## 2017-11-15 DIAGNOSIS — M25512 Pain in left shoulder: Secondary | ICD-10-CM | POA: Diagnosis not present

## 2017-11-15 DIAGNOSIS — M25612 Stiffness of left shoulder, not elsewhere classified: Secondary | ICD-10-CM | POA: Insufficient documentation

## 2017-11-15 DIAGNOSIS — R293 Abnormal posture: Secondary | ICD-10-CM | POA: Diagnosis not present

## 2017-11-15 NOTE — Therapy (Signed)
[place], [place], [address] Phone: [phone]   Fax:  [phone]  Physical Therapy Treatment  Patient Details  Name: Madison Savage MRN: [id number] Date of Birth: 10-03-1970 Referring Provider: Dr. Burr Medico   Encounter Date: 11/15/2017  PT End of Session - 11/15/17 1223    Visit Number  2    Date for PT Re-Evaluation  01/06/18    PT Start Time  0930    PT Stop Time  1015    PT Time Calculation (min)  45 min    Activity Tolerance  Patient tolerated treatment well    Behavior During Therapy  [facility] for tasks assessed/performed       Past Medical History:  Diagnosis Date  . Anemia   . Anxiety   . Cancer ([place]) 03/2017   left breast cancer  . Chronic ITP (idiopathic thrombocytopenia) (HCC)   . Family history of breast cancer   . Family history of ovarian cancer   . Headache    last migraine...9/24  . Heart murmur    at birth.  none now  . MTHFR mutation [facility])     Past Surgical History:  Procedure Laterality Date  . AXILLARY LYMPH NODE DISSECTION Left 04/27/2017   Procedure: AXILLARY LYMPH NODE DISSECTION;  Surgeon: [name], MD;  Location: [place];  Service: General;  Laterality: Left;  . BILATERAL TOTAL MASTECTOMY WITH AXILLARY LYMPH NODE DISSECTION Bilateral 04/27/2017   Procedure: SKIN SPARING MASTECTOMY;  Surgeon: [name], MD;  Location: [place];  Service: General;  Laterality: Bilateral;  . BREAST RECONSTRUCTION WITH PLACEMENT OF TISSUE EXPANDER AND FLEX HD (ACELLULAR HYDRATED DERMIS) Bilateral 04/27/2017   Procedure: BILATERAL BREAST RECONSTRUCTION WITH PLACEMENT OF TISSUE EXPANDER AND FLEX HD (ACELLULAR HYDRATED DERMIS);  Surgeon: Wallace Going, DO;  Location: [place];  Service: Plastics;  Laterality: Bilateral;  . BREAST SURGERY    . MASTECTOMY    . PORTACATH PLACEMENT Right 06/01/2017   Procedure: INSERTION PORT-A-CATH  RIGHT SUBCLAVIAN;  Surgeon: [name], MD;  Location: [place];  Service: General;  Laterality: Right;  . WISDOM TOOTH EXTRACTION      There were no vitals filed for this visit.  Subjective Assessment - 11/15/17 1218    Subjective  Pt has her last chemo treatment tomorrow.  she exeperiences increased cording into left arm after almost every chemo treatment, but is goes away after a few days.  She still has some tightness in her left arm today     Pertinent History  Patient was diagnosed on 03/21/17 with left invasive lobular carcinoma breast cancer. It measures 2.1 cm and is located in the upper outer quadrant. She also has 6 abnormal axillary lymph nodes and a node was biopsied and found to be positive for carcinoma.   04/27/2017 .bilateral mastecomy with ALND ( 21 nodes removed) with immedicate expanders. She is planning to have chemo, then have implant surgery and hysterectomy and then have radiation     Patient Stated Goals  to get rid of shoulder pain     Currently in Pain?  Yes    Pain Score  -- did not rate    Pain Location  Arm    Pain Orientation  Left    Pain Descriptors / Indicators  Tightness    Pain Type  Acute pain    Pain Radiating Towards  to left upper arm  [place] Adult PT Treatment/Exercise - 11/15/17 0001      Shoulder Exercises: Seated   Other Seated Exercises  green theraband looped around shoudlers with end in each hand.  opposite forearm on ball with downward pressure to engage core and protraction with other arm     Other Seated Exercises  seated press downs on folded towel to engage scapular muscles       Manual Therapy   Soft tissue mobilization  in right sidelying, with biotone to tight tender areas in left lateral scapula and posterior shoulder areas     Myofascial Release  to cording in left axilla prolonged pressure to trigger points in left teres major     Scapular Mobilization  in sidelying to each scapula, left scapula  tighter than right                 Short Term Clinic Goals - 08/31/17 1249      CC Short Term Goal  #1   Title  pt will be knowledgable of lymphedema risk reduction practices     Status  Achieved      CC Short Term Goal  #2   Title  Pt will be independent in postural and shoulder stretching exercises    Status  Achieved       PT Long Term Goals - 11/01/17 1253      PT LONG TERM GOAL #1   Title  Pt will report that the pain and discomfort in left shoulder has decreased by 50%     Time  8    Period  Weeks    Status  New      PT LONG TERM GOAL #2   Title  Pt will be independent in upgraded HEP for scapular strength and ROM     Time  [facility] Goals - 03/29/17 1232      Patient will be able to verbalize understanding of pertinent lymphedema risk reduction practices relevant to her diagnosis specifically related to skin care.   Time  1    Period  Days    Status  Achieved      Patient will be able to return demonstrate and/or verbalize understanding of the post-op home exercise program related to regaining shoulder range of motion.   Time  1    Period  Days    Status  Achieved      Patient will be able to verbalize understanding of the importance of attending the postoperative After Breast Cancer Class for further lymphedema risk reduction education and therapeutic exercise.   Time  1    Period  Days    Status  Achieved       Long Term Clinic Goals - 08/31/17 1100      CC Long Term Goal  #1   Title  Pt will have 140 degrees of left shoulder abduction so that she can received radiation treatment     Baseline  05/25/2017: 60 degrees , 07/05/2017  140 degrees ,08/01/2017 155     Status  Achieved      CC Long Term Goal  #2   Title  Pt will ahve 150 degrees of right shoulder abduction so that she can perform household activities without difficulty     Baseline  05/25/2017: 95 degrees , 155 degrees on 07/05/2017, 167 on  08/01/2017 , 08/31/2017:  180 degrees  Status  Achieved      CC Long Term Goal  #3   Title  Pt will be independent in strength ABC program with knowledge of how to safely increase her intensity of exercise    Status  Achieved      CC Long Term Goal  #4   Title  Pt will decrease Quick DASH score to < 20 indicating a function improvment of arms     Baseline  52.27 on 05/25/2017 , 4.55 on 08/31/2017    Status  Achieved      CC Long Term Goal  #5   Title   will have a report a reduction in cording left axilla by 75%    Baseline  cording is still present but decreased by 50%    Status  Partially Met      CC Long Term Goal  #6   Title  Pt will be able to return to plank and side crunch exercise that she wants to do without pain     Status  Achieved         Plan - 11/15/17 1223    Clinical Impression Statement  Overall, pt is doing remarkably well,  She still has some tightness in left scapuar area with increased cording after chemo  She receives some relief with soft tissue work     Clinical Impairments Affecting Rehab Potential  21 nodes removed in left axilla     PT Frequency  1x / week    PT Duration  8 weeks    PT Treatment/Interventions  Therapeutic exercise;Patient/family education;ADLs/Self Care Home Management;Taping;DME Instruction;Dry needling;Functional mobility training;Therapeutic activities;Manual lymph drainage;Manual techniques;Passive range of motion    PT Next Visit Plan  assess effect of new exercise.   focus on left scapular mobilizaion Progress scapular ROM and strengthening    Consulted and Agree with Plan of Care  Patient       Patient will benefit from skilled therapeutic intervention in order to improve the following deficits and impairments:  Decreased range of motion, Pain, Postural dysfunction, Increased fascial restricitons, Impaired perceived functional ability  Visit Diagnosis: Stiffness of left shoulder, not elsewhere classified  Acute pain of left  shoulder  Aftercare following surgery for neoplasm  Abnormal posture  Stiffness of right shoulder, not elsewhere classified  Pain of right shoulder joint on movement     Problem List Patient Active Problem List   Diagnosis Date Noted  . Port-A-Cath in place 08/18/2017  . Breast cancer ([place]) 04/27/2017  . Genetic testing 04/17/2017  . BRCA2 gene mutation positive 04/17/2017  . Family history of breast cancer   . Family history of ovarian cancer   . Breast cancer metastasized to axillary lymph node, left ([place]) 03/29/2017  . Cough 03/10/2014  . Esophagitis 03/10/2014  . Chronic ITP (idiopathic thrombocytopenia) (HCC) 03/10/2014  . ALLERGIC REACTION, ACUTE 07/22/2008   Donato Heinz. Owens Shark PT  Norwood Levo 11/15/2017, 12:25 PM  [place], [place], [address] Phone: [phone]   Fax:  [phone]  Name: Madison Savage MRN: [id number] Date of Birth: 29-Nov-1970

## 2017-11-16 ENCOUNTER — Inpatient Hospital Stay: Payer: BLUE CROSS/BLUE SHIELD

## 2017-11-16 ENCOUNTER — Inpatient Hospital Stay (HOSPITAL_BASED_OUTPATIENT_CLINIC_OR_DEPARTMENT_OTHER): Payer: BLUE CROSS/BLUE SHIELD | Admitting: Hematology

## 2017-11-16 ENCOUNTER — Encounter: Payer: Self-pay | Admitting: *Deleted

## 2017-11-16 ENCOUNTER — Encounter: Payer: Self-pay | Admitting: Hematology

## 2017-11-16 ENCOUNTER — Ambulatory Visit: Payer: BLUE CROSS/BLUE SHIELD

## 2017-11-16 VITALS — BP 119/69 | HR 105 | Temp 98.4°F | Resp 18 | Ht 67.5 in | Wt 128.6 lb

## 2017-11-16 DIAGNOSIS — C50912 Malignant neoplasm of unspecified site of left female breast: Secondary | ICD-10-CM

## 2017-11-16 DIAGNOSIS — C773 Secondary and unspecified malignant neoplasm of axilla and upper limb lymph nodes: Principal | ICD-10-CM

## 2017-11-16 DIAGNOSIS — C50412 Malignant neoplasm of upper-outer quadrant of left female breast: Secondary | ICD-10-CM | POA: Diagnosis not present

## 2017-11-16 DIAGNOSIS — Z1509 Genetic susceptibility to other malignant neoplasm: Secondary | ICD-10-CM

## 2017-11-16 DIAGNOSIS — Z1501 Genetic susceptibility to malignant neoplasm of breast: Secondary | ICD-10-CM

## 2017-11-16 DIAGNOSIS — F419 Anxiety disorder, unspecified: Secondary | ICD-10-CM | POA: Diagnosis not present

## 2017-11-16 DIAGNOSIS — C50919 Malignant neoplasm of unspecified site of unspecified female breast: Secondary | ICD-10-CM

## 2017-11-16 DIAGNOSIS — G4700 Insomnia, unspecified: Secondary | ICD-10-CM | POA: Diagnosis not present

## 2017-11-16 DIAGNOSIS — Z5111 Encounter for antineoplastic chemotherapy: Secondary | ICD-10-CM | POA: Diagnosis not present

## 2017-11-16 DIAGNOSIS — D693 Immune thrombocytopenic purpura: Secondary | ICD-10-CM

## 2017-11-16 LAB — CBC WITH DIFFERENTIAL/PLATELET
Basophils Absolute: 0 10*3/uL (ref 0.0–0.1)
Basophils Relative: 1 %
Eosinophils Absolute: 0.1 10*3/uL (ref 0.0–0.5)
Eosinophils Relative: 1 %
HCT: 36.8 % (ref 34.8–46.6)
Hemoglobin: 12.4 g/dL (ref 11.6–15.9)
Lymphocytes Relative: 20 %
Lymphs Abs: 0.8 10*3/uL — ABNORMAL LOW (ref 0.9–3.3)
MCH: 31.6 pg (ref 25.1–34.0)
MCHC: 33.5 g/dL (ref 31.5–36.0)
MCV: 94.1 fL (ref 79.5–101.0)
Monocytes Absolute: 0.2 10*3/uL (ref 0.1–0.9)
Monocytes Relative: 6 %
Neutro Abs: 2.9 10*3/uL (ref 1.5–6.5)
Neutrophils Relative %: 72 %
Platelets: 200 10*3/uL (ref 145–400)
RBC: 3.91 MIL/uL (ref 3.70–5.45)
RDW: 15.1 % — ABNORMAL HIGH (ref 11.2–14.5)
WBC: 4 10*3/uL (ref 3.9–10.3)

## 2017-11-16 LAB — COMPREHENSIVE METABOLIC PANEL
ALT: 14 U/L (ref 0–55)
AST: 13 U/L (ref 5–34)
Albumin: 3.7 g/dL (ref 3.5–5.0)
Alkaline Phosphatase: 61 U/L (ref 40–150)
Anion gap: 7 (ref 3–11)
BUN: 15 mg/dL (ref 7–26)
CO2: 24 mmol/L (ref 22–29)
Calcium: 9 mg/dL (ref 8.4–10.4)
Chloride: 108 mmol/L (ref 98–109)
Creatinine, Ser: 0.83 mg/dL (ref 0.60–1.10)
GFR calc Af Amer: >60 mL/min (ref >60)
GFR calc non Af Amer: >60 mL/min (ref >60)
Glucose, Bld: 110 mg/dL (ref 70–140)
Potassium: 3.9 mmol/L (ref 3.5–5.1)
Sodium: 139 mmol/L (ref 136–145)
Total Bilirubin: 0.2 mg/dL (ref 0.2–1.2)
Total Protein: 6 g/dL — ABNORMAL LOW (ref 6.4–8.3)

## 2017-11-16 MED ORDER — SODIUM CHLORIDE 0.9% FLUSH
10.0000 mL | INTRAVENOUS | Status: DC | PRN
  Administered 2017-11-16: 10 mL
  Filled 2017-11-16: qty 10

## 2017-11-16 MED ORDER — PROCHLORPERAZINE MALEATE 10 MG PO TABS
ORAL_TABLET | ORAL | Status: AC
  Filled 2017-11-16: qty 1

## 2017-11-16 MED ORDER — SODIUM CHLORIDE 0.9% FLUSH
10.0000 mL | Freq: Once | INTRAVENOUS | Status: AC
  Administered 2017-11-16: 10 mL via INTRAVENOUS
  Filled 2017-11-16: qty 10

## 2017-11-16 MED ORDER — PROCHLORPERAZINE MALEATE 10 MG PO TABS
10.0000 mg | ORAL_TABLET | Freq: Once | ORAL | Status: AC
  Administered 2017-11-16: 10 mg via ORAL

## 2017-11-16 MED ORDER — HEPARIN SOD (PORK) LOCK FLUSH 100 UNIT/ML IV SOLN
500.0000 [IU] | Freq: Once | INTRAVENOUS | Status: AC | PRN
  Administered 2017-11-16: 500 [IU]
  Filled 2017-11-16: qty 5

## 2017-11-16 MED ORDER — SODIUM CHLORIDE 0.9 % IV SOLN
Freq: Once | INTRAVENOUS | Status: AC
  Administered 2017-11-16: 10:00:00 via INTRAVENOUS

## 2017-11-16 MED ORDER — PACLITAXEL PROTEIN-BOUND CHEMO INJECTION 100 MG
90.0000 mg/m2 | Freq: Once | INTRAVENOUS | Status: AC
  Administered 2017-11-16: 150 mg via INTRAVENOUS
  Filled 2017-11-16: qty 30

## 2017-11-16 NOTE — Patient Instructions (Signed)

## 2017-11-16 NOTE — Progress Notes (Signed)
Per Dr. Burr Medico: Pt requested not to take Zoladex today. Do not give Zoladex. Liza, Infusion RN made aware.

## 2017-11-16 NOTE — Patient Instructions (Signed)
[facility] Discharge Instructions for Patients Receiving Chemotherapy  Today you received the following chemotherapy agents:  Abraxane.  To help prevent nausea and vomiting after your treatment, we encourage you to take your nausea medication as directed.   If you develop nausea and vomiting that is not controlled by your nausea medication, call the clinic.   BELOW ARE SYMPTOMS THAT SHOULD BE REPORTED IMMEDIATELY:  *FEVER GREATER THAN 100.5 F  *CHILLS WITH OR WITHOUT FEVER  NAUSEA AND VOMITING THAT IS NOT CONTROLLED WITH YOUR NAUSEA MEDICATION  *UNUSUAL SHORTNESS OF BREATH  *UNUSUAL BRUISING OR BLEEDING  TENDERNESS IN MOUTH AND THROAT WITH OR WITHOUT PRESENCE OF ULCERS  *URINARY PROBLEMS  *BOWEL PROBLEMS  UNUSUAL RASH Items with * indicate a potential emergency and should be followed up as soon as possible.  Feel free to call the clinic should you have any questions or concerns. The clinic phone number is [phone].  Please show the CHEMO ALERT CARD at check-in to the Emergency Department and triage nurse.   

## 2017-11-17 ENCOUNTER — Ambulatory Visit: Payer: BLUE CROSS/BLUE SHIELD

## 2017-11-17 MED ORDER — GOSERELIN ACETATE 3.6 MG ~~LOC~~ IMPL
DRUG_IMPLANT | SUBCUTANEOUS | Status: AC
  Filled 2017-11-17: qty 3.6

## 2017-11-21 ENCOUNTER — Telehealth: Payer: Self-pay | Admitting: Hematology

## 2017-11-21 NOTE — Telephone Encounter (Signed)
Appointment scheduled letter/calendar mailed to patient per 3/14 sch msg

## 2017-11-22 ENCOUNTER — Ambulatory Visit: Payer: BLUE CROSS/BLUE SHIELD | Admitting: Physical Therapy

## 2017-11-22 ENCOUNTER — Encounter: Payer: Self-pay | Admitting: Physical Therapy

## 2017-11-22 DIAGNOSIS — M25612 Stiffness of left shoulder, not elsewhere classified: Secondary | ICD-10-CM | POA: Diagnosis not present

## 2017-11-22 DIAGNOSIS — Z483 Aftercare following surgery for neoplasm: Secondary | ICD-10-CM

## 2017-11-22 DIAGNOSIS — M25512 Pain in left shoulder: Secondary | ICD-10-CM | POA: Diagnosis not present

## 2017-11-22 DIAGNOSIS — R293 Abnormal posture: Secondary | ICD-10-CM | POA: Diagnosis not present

## 2017-11-22 DIAGNOSIS — M25611 Stiffness of right shoulder, not elsewhere classified: Secondary | ICD-10-CM | POA: Diagnosis not present

## 2017-11-22 DIAGNOSIS — M25511 Pain in right shoulder: Secondary | ICD-10-CM | POA: Diagnosis not present

## 2017-11-22 NOTE — Therapy (Signed)
[facility] [address] [place], [place], [address] Phone: [phone]   Fax:  [phone]  Physical Therapy Treatment  Patient Details  Name: Madison Savage MRN: [id number] Date of Birth: 03/09/1971 Referring Provider: Dr. Feng   Encounter Date: 11/22/2017  PT End of Session - 11/22/17 1043    Visit Number  3    Number of Visits  9    Date for PT Re-Evaluation  01/06/18    PT Start Time  0930    PT Stop Time  1015    PT Time Calculation (min)  45 min    Activity Tolerance  Patient tolerated treatment well    Behavior During Therapy  WFL for tasks assessed/performed       Past Medical History:  Diagnosis Date  . Anemia   . Anxiety   . Cancer (H[facility]) 03/2017   left breast cancer  . Chronic ITP (idiopathic thrombocytopenia) (HCC)   . Family history of breast cancer   . Family history of ovarian cancer   . Headache    last migraine...9/24  . Heart murmur    at birth.  none now  . MTHFR mutation (HCC)     Past Surgical History:  Procedure Laterality Date  . AXILLARY LYMPH NODE DISSECTION Left 04/27/2017   Procedure: AXILLARY LYMPH NODE DISSECTION;  Surgeon: Byerly, Faera, MD;  Location: [facility];  Service: General;  Laterality: Left;  . BILATERAL TOTAL MASTECTOMY WITH AXILLARY LYMPH NODE DISSECTION Bilateral 04/27/2017   Procedure: SKIN SPARING MASTECTOMY;  Surgeon: Byerly, Faera, MD;  Location: [facility];  Service: General;  Laterality: Bilateral;  . BREAST RECONSTRUCTION WITH PLACEMENT OF TISSUE EXPANDER AND FLEX HD (ACELLULAR HYDRATED DERMIS) Bilateral 04/27/2017   Procedure: BILATERAL BREAST RECONSTRUCTION WITH PLACEMENT OF TISSUE EXPANDER AND FLEX HD (ACELLULAR HYDRATED DERMIS);  Surgeon: Dillingham, Claire S, DO;  Location:  SUR[facility]ERY [facility];  Service: Plastics;  Laterality: Bilateral;  . BREAST SURGERY    . MASTECTOMY    . PORTACATH PLACEMENT Right 06/01/2017   Procedure: INSERTION PORT-A-CATH RIGHT SUBCLAVIAN;  Surgeon: Byerly, Faera, MD;  [facility];  Service: General;  Laterality: Right;  . WISDOM TOOTH EXTRACTION      There were no vitals filed for this visit.  Subjective Assessment - 11/22/17 0937    Subjective  Pt has her last chemo last week.  She has episode of full body swelling last week, but feels better now.  She has not had cording. this time.  She has been working out.  She has her exchange surgery in 2 weeks.     Pertinent History  Patient was diagnosed on 03/21/17 with left invasive lobular carcinoma breast cancer. It measures 2.1 cm and is located in the upper outer quadrant. She also has 6 abnormal axillary lymph nodes and a node was biopsied and found to be positive for carcinoma.   04/27/2017 .bilateral mastecomy with ALND ( 21 nodes removed) with immedicate expanders. She is planning to have chemo, then have implant surgery and hysterectomy and then have radiation     Patient Stated Goals  to get rid of shoulder pain                       [facility] Adult PT Treatment/Exercise - 11/22/17 0001      Lumbar Exercises: Prone   Single Arm Raise  Right;Left;5 reps    Other Prone Lumbar Exercises  full can with arm out   to side 5 reps with each arm     Other Prone Lumbar Exercises  upper back extension, stopped due to pain in low back       Lumbar Exercises: Quadruped   [place]  5 reps    Other Quadruped Lumbar Exercises  childs pose for stretch     Other Quadruped Lumbar Exercises  hand behind head for thoacic spine mobility       Shoulder Exercises: Standing   Retraction  AROM;Both;5 reps    Other Standing Exercises  "open and closed book" exercise for thoracic mobilizaion  and chest expansion      Shoulder Exercises: Stretch   Other Shoulder Stretches  lying over purple squishy ball x 3 minutes for anterior chest stretch       Manual Therapy   Joint Mobilization  in prone, gently to thoracic spine      Scapular Mobilization  in sidelying to left scapula, able to fingers under medial border of scapula today for prolonged stretch     Passive ROM  PROM to left shoulder in supine in direction of flexion, abduction providing pressure to pec to help release                Short Term Clinic Goals - 08/31/17 1249      CC Short Term Goal  #1   Title  pt will be knowledgable of lymphedema risk reduction practices     Status  Achieved      CC Short Term Goal  #2   Title  Pt will be independent in postural and shoulder stretching exercises    Status  Achieved       PT Long Term Goals - 11/01/17 1253      PT LONG TERM GOAL #1   Title  Pt will report that the pain and discomfort in left shoulder has decreased by 50%     Time  8    Period  Weeks    Status  New      PT LONG TERM GOAL #2   Title  Pt will be independent in upgraded HEP for scapular strength and ROM     Time  8    Period  Weeks    Status  New      Breast Clinic Goals - 03/29/17 1232      Patient will be able to verbalize understanding of pertinent lymphedema risk reduction practices relevant to her diagnosis specifically related to skin care.   Time  1    Period  Days    Status  Achieved      Patient will be able to return demonstrate and/or verbalize understanding of the post-op home exercise program related to regaining shoulder range of motion.   Time  1    Period  Days    Status  Achieved      Patient will be able to verbalize understanding of the importance of attending the postoperative After Breast Cancer Class for further lymphedema risk reduction education and therapeutic exercise.   Time  1    Period  Days    Status  Achieved       Long Term Clinic Goals - 08/31/17 1100      CC Long Term Goal  #1   Title  Pt will have 140 degrees of left shoulder abduction so that she can received radiation treatment     Baseline  05/25/2017: 60 degrees , 07/05/2017  140 degrees ,08/01/2017 155       Status   Achieved      CC Long Term Goal  #2   Title  Pt will ahve 150 degrees of right shoulder abduction so that she can perform household activities without difficulty     Baseline  05/25/2017: 95 degrees , 155 degrees on 07/05/2017, 167 on 08/01/2017 , 08/31/2017:  180 degrees    Status  Achieved      CC Long Term Goal  #3   Title  Pt will be independent in strength ABC program with knowledge of how to safely increase her intensity of exercise    Status  Achieved      CC Long Term Goal  #4   Title  Pt will decrease Quick DASH score to < 20 indicating a function improvment of arms     Baseline  52.27 on 05/25/2017 , 4.55 on 08/31/2017    Status  Achieved      CC Long Term Goal  #5   Title   will have a report a reduction in cording left axilla by 75%    Baseline  cording is still present but decreased by 50%    Status  Partially Met      CC Long Term Goal  #6   Title  Pt will be able to return to plank and side crunch exercise that she wants to do without pain     Status  Achieved         Plan - 11/22/17 1043    Clinical Impression Statement  pt has had some symtpoms of generalized swelling that she contributes to chemo, but overall is doing well.  Better mobliity of scapula today with more work to throacic mobility     Clinical Impairments Affecting Rehab Potential  21 nodes removed in left axilla     PT Frequency  1x / week    PT Duration  8 weeks    PT Next Visit Plan  assess effect of new exercise.   focus on left scapular and thoracic spine  mobilizaion Progress scapular ROM and strengthening    Consulted and Agree with Plan of Care  Patient       Patient will benefit from skilled therapeutic intervention in order to improve the following deficits and impairments:  Decreased range of motion, Pain, Postural dysfunction, Increased fascial restricitons, Impaired perceived functional ability  Visit Diagnosis: Stiffness of left shoulder, not elsewhere classified  Acute pain of  left shoulder  Aftercare following surgery for neoplasm  Abnormal posture  Stiffness of right shoulder, not elsewhere classified     Problem List Patient Active Problem List   Diagnosis Date Noted  . Port-A-Cath in place 08/18/2017  . Breast cancer ([place]) 04/27/2017  . Genetic testing 04/17/2017  . BRCA2 gene mutation positive 04/17/2017  . Family history of breast cancer   . Family history of ovarian cancer   . 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 Cough 03/10/2014  . Esophagitis 03/10/2014  . Chronic ITP (idiopathic thrombocytopenia) (HCC) 03/10/2014  . ALLERGIC REACTION, ACUTE 07/22/2008   Donato Heinz. Owens Shark PT  Norwood Levo 11/22/2017, 10:47 AM  [place], [place], [address] Phone: [phone]   Fax:  [phone]  Name: PETINA MURASKI MRN: [id number] Date of Birth: [birth year]

## 2017-11-27 ENCOUNTER — Telehealth: Payer: Self-pay | Admitting: *Deleted

## 2017-11-27 NOTE — Telephone Encounter (Signed)
Called and left a message for the patient to call the office back. Patient need to be notified of the appt with Dr. Denman George on April 23rd at 11:30am

## 2017-11-28 DIAGNOSIS — H00024 Hordeolum internum left upper eyelid: Secondary | ICD-10-CM | POA: Diagnosis not present

## 2017-11-29 ENCOUNTER — Ambulatory Visit: Payer: BLUE CROSS/BLUE SHIELD | Admitting: Physical Therapy

## 2017-11-29 ENCOUNTER — Telehealth: Payer: Self-pay | Admitting: *Deleted

## 2017-11-29 DIAGNOSIS — R293 Abnormal posture: Secondary | ICD-10-CM

## 2017-11-29 DIAGNOSIS — M25511 Pain in right shoulder: Secondary | ICD-10-CM | POA: Diagnosis not present

## 2017-11-29 DIAGNOSIS — Z483 Aftercare following surgery for neoplasm: Secondary | ICD-10-CM

## 2017-11-29 DIAGNOSIS — M25611 Stiffness of right shoulder, not elsewhere classified: Secondary | ICD-10-CM | POA: Diagnosis not present

## 2017-11-29 DIAGNOSIS — M25512 Pain in left shoulder: Secondary | ICD-10-CM

## 2017-11-29 DIAGNOSIS — M25612 Stiffness of left shoulder, not elsewhere classified: Secondary | ICD-10-CM

## 2017-11-29 NOTE — Therapy (Signed)
[place], [place], [address] Phone: [phone]   Fax:  [phone]  Physical Therapy Treatment  Patient Details  Name: Madison Savage MRN: [id number] Date of Birth: Feb 10, 1971 Referring Provider: Dr. Burr Medico   Encounter Date: 11/29/2017    Past Medical History:  Diagnosis Date  . Anemia   . Anxiety   . Cancer ([facility]) 03/2017   left breast cancer  . Chronic ITP (idiopathic thrombocytopenia) (HCC)   . Family history of breast cancer   . Family history of ovarian cancer   . Headache    last migraine...9/24  . Heart murmur    at birth.  none now  . MTHFR mutation [facility])     Past Surgical History:  Procedure Laterality Date  . AXILLARY LYMPH NODE DISSECTION Left 04/27/2017   Procedure: AXILLARY LYMPH NODE DISSECTION;  Surgeon: [name], MD;  Location: [place];  Service: General;  Laterality: Left;  . BILATERAL TOTAL MASTECTOMY WITH AXILLARY LYMPH NODE DISSECTION Bilateral 04/27/2017   Procedure: SKIN SPARING MASTECTOMY;  Surgeon: [name], MD;  Location: [place];  Service: General;  Laterality: Bilateral;  . BREAST RECONSTRUCTION WITH PLACEMENT OF TISSUE EXPANDER AND FLEX HD (ACELLULAR HYDRATED DERMIS) Bilateral 04/27/2017   Procedure: BILATERAL BREAST RECONSTRUCTION WITH PLACEMENT OF TISSUE EXPANDER AND FLEX HD (ACELLULAR HYDRATED DERMIS);  Surgeon: Wallace Going, DO;  Location: [place];  Service: Plastics;  Laterality: Bilateral;  . BREAST SURGERY    . MASTECTOMY    . PORTACATH PLACEMENT Right 06/01/2017   Procedure: INSERTION PORT-A-CATH RIGHT SUBCLAVIAN;  Surgeon: [name], MD;  Location: [place];  Service: General;  Laterality: Right;  . WISDOM TOOTH EXTRACTION      There were no vitals filed for this visit.       Access Code: [email or web address]  URL: [email or web address]  Date: 11/29/2017  Prepared by: Maudry Diego   Exercises  Thoracic Extension Mobilization on Foam Roll - 10 reps - 3 sets - 5 hold - 1x daily - 7x weekly  Open Book Chest Rotation Stretch on Foam 1/2 Roll - 10 reps - 3 sets - 10 hold - 1x daily - 7x weekly  Standing Overhead [place] at [place] 10 reps - 3 sets - 1x daily - 7x weekly  Overhead and Behind Back Towel [place] on Stability Ball - 10 reps - 3 sets - 1x daily - 7x weekly         No data recorded                  Short Term Clinic Goals - 08/31/17 1249      CC Short Term Goal  #1   Title  pt will be knowledgable of lymphedema risk reduction practices     Status  Achieved      CC Short Term Goal  #2   Title  Pt will be independent in postural and shoulder stretching exercises    Status  Achieved       PT Long Term Goals - 11/01/17 1253      PT LONG TERM GOAL #1   Title  Pt will report that the pain and discomfort in left shoulder has decreased by 50%     Time  8    Period  Weeks    Status  New      PT LONG TERM GOAL #2   Title  Pt will be independent in upgraded  HEP for scapular strength and ROM     Time  8    Period  Weeks    Status  New      Breast Clinic Goals - 03/29/17 1232      Patient will be able to verbalize understanding of pertinent lymphedema risk reduction practices relevant to her diagnosis specifically related to skin care.   Time  1    Period  Days    Status  Achieved      Patient will be able to return demonstrate and/or verbalize understanding of the post-op home exercise program related to regaining shoulder range of motion.   Time  1    Period  Days    Status  Achieved      Patient will be able to verbalize understanding of the importance of attending the postoperative After Breast Cancer Class for further lymphedema risk reduction education and therapeutic exercise.   Time  1    Period  Days    Status  Achieved       Long Term Clinic Goals - 08/31/17 1100      CC Long Term Goal  #1   Title  Pt  will have 140 degrees of left shoulder abduction so that she can received radiation treatment     Baseline  05/25/2017: 60 degrees , 07/05/2017  140 degrees ,08/01/2017 155     Status  Achieved      CC Long Term Goal  #2   Title  Pt will ahve 150 degrees of right shoulder abduction so that she can perform household activities without difficulty     Baseline  05/25/2017: 95 degrees , 155 degrees on 07/05/2017, 167 on 08/01/2017 , 08/31/2017:  180 degrees    Status  Achieved      CC Long Term Goal  #3   Title  Pt will be independent in strength ABC program with knowledge of how to safely increase her intensity of exercise    Status  Achieved      CC Long Term Goal  #4   Title  Pt will decrease Quick DASH score to < 20 indicating a function improvment of arms     Baseline  52.27 on 05/25/2017 , 4.55 on 08/31/2017    Status  Achieved      CC Long Term Goal  #5   Title   will have a report a reduction in cording left axilla by 75%    Baseline  cording is still present but decreased by 50%    Status  Partially Met      CC Long Term Goal  #6   Title  Pt will be able to return to plank and side crunch exercise that she wants to do without pain     Status  Achieved           Patient will benefit from skilled therapeutic intervention in order to improve the following deficits and impairments:     Visit Diagnosis: No diagnosis found.     Problem List Patient Active Problem List   Diagnosis Date Noted  . Port-A-Cath in place 08/18/2017  . Breast cancer ([facility] [facility]) 04/27/2017  . Genetic testing 04/17/2017  . BRCA2 gene mutation positive 04/17/2017  . Family history of breast cancer   . Family history of ovarian cancer   . Breast cancer metastasized to axillary lymph node, left ([facility]) 03/29/2017  . Cough 03/10/2014  . Esophagitis 03/10/2014  . Chronic ITP (idiopathic thrombocytopenia) (HCC) 03/10/2014  .  ALLERGIC REACTION, ACUTE 07/22/2008   Donato Heinz. Owens Shark PT  Norwood Levo 11/29/2017, 9:58 AM  [place], [place], [address] Phone: [phone]   Fax:  [phone]  Name: Madison Savage MRN: [id number] Date of Birth: 03/26/1971

## 2017-11-29 NOTE — Telephone Encounter (Signed)
Called and gave the patient the appt for April 23rd at 11:30am

## 2017-11-30 ENCOUNTER — Encounter (HOSPITAL_BASED_OUTPATIENT_CLINIC_OR_DEPARTMENT_OTHER): Payer: Self-pay | Admitting: *Deleted

## 2017-11-30 ENCOUNTER — Encounter: Payer: Self-pay | Admitting: Physical Therapy

## 2017-12-04 ENCOUNTER — Ambulatory Visit: Payer: Self-pay | Admitting: Plastic Surgery

## 2017-12-04 DIAGNOSIS — Z9013 Acquired absence of bilateral breasts and nipples: Secondary | ICD-10-CM

## 2017-12-05 ENCOUNTER — Ambulatory Visit: Payer: BLUE CROSS/BLUE SHIELD | Admitting: Physical Therapy

## 2017-12-06 ENCOUNTER — Encounter (HOSPITAL_BASED_OUTPATIENT_CLINIC_OR_DEPARTMENT_OTHER): Payer: Self-pay | Admitting: *Deleted

## 2017-12-06 ENCOUNTER — Ambulatory Visit (HOSPITAL_BASED_OUTPATIENT_CLINIC_OR_DEPARTMENT_OTHER): Payer: BLUE CROSS/BLUE SHIELD | Admitting: Certified Registered"

## 2017-12-06 ENCOUNTER — Ambulatory Visit (HOSPITAL_BASED_OUTPATIENT_CLINIC_OR_DEPARTMENT_OTHER)
Admission: RE | Admit: 2017-12-06 | Discharge: 2017-12-06 | Disposition: A | Discharge status: Home / Self Care | Payer: BLUE CROSS/BLUE SHIELD | Source: Ambulatory Visit | Attending: Plastic Surgery | Admitting: Plastic Surgery

## 2017-12-06 ENCOUNTER — Encounter (HOSPITAL_BASED_OUTPATIENT_CLINIC_OR_DEPARTMENT_OTHER)
Admission: RE | Disposition: A | Discharge status: Home / Self Care | Payer: Self-pay | Source: Ambulatory Visit | Attending: Plastic Surgery

## 2017-12-06 ENCOUNTER — Other Ambulatory Visit: Payer: Self-pay

## 2017-12-06 DIAGNOSIS — D693 Immune thrombocytopenic purpura: Secondary | ICD-10-CM | POA: Insufficient documentation

## 2017-12-06 DIAGNOSIS — Z853 Personal history of malignant neoplasm of breast: Secondary | ICD-10-CM | POA: Diagnosis not present

## 2017-12-06 DIAGNOSIS — Z87891 Personal history of nicotine dependence: Secondary | ICD-10-CM | POA: Diagnosis not present

## 2017-12-06 DIAGNOSIS — F419 Anxiety disorder, unspecified: Secondary | ICD-10-CM | POA: Insufficient documentation

## 2017-12-06 DIAGNOSIS — Z421 Encounter for breast reconstruction following mastectomy: Secondary | ICD-10-CM | POA: Diagnosis not present

## 2017-12-06 DIAGNOSIS — C773 Secondary and unspecified malignant neoplasm of axilla and upper limb lymph nodes: Secondary | ICD-10-CM | POA: Diagnosis not present

## 2017-12-06 DIAGNOSIS — Z79899 Other long term (current) drug therapy: Secondary | ICD-10-CM | POA: Diagnosis not present

## 2017-12-06 DIAGNOSIS — C50811 Malignant neoplasm of overlapping sites of right female breast: Secondary | ICD-10-CM | POA: Diagnosis not present

## 2017-12-06 DIAGNOSIS — Z170 Estrogen receptor positive status [ER+]: Secondary | ICD-10-CM | POA: Diagnosis not present

## 2017-12-06 DIAGNOSIS — Z9013 Acquired absence of bilateral breasts and nipples: Secondary | ICD-10-CM | POA: Insufficient documentation

## 2017-12-06 HISTORY — PX: PORT-A-CATH REMOVAL: SHX5289

## 2017-12-06 HISTORY — PX: PLACEMENT OF BREAST IMPLANTS: SHX6334

## 2017-12-06 HISTORY — PX: BREAST CAPSULECTOMY WITH IMPLANT EXCHANGE: SHX5592

## 2017-12-06 LAB — CBC
HCT: 39.3 % (ref 36.0–46.0)
Hemoglobin: 13 g/dL (ref 12.0–15.0)
MCH: 30.7 pg (ref 26.0–34.0)
MCHC: 33.1 g/dL (ref 30.0–36.0)
MCV: 92.7 fL (ref 78.0–100.0)
Platelets: 176 10*3/uL (ref 150–400)
RBC: 4.24 MIL/uL (ref 3.87–5.11)
RDW: 14.8 % (ref 11.5–15.5)
WBC: 6.5 10*3/uL (ref 4.0–10.5)

## 2017-12-06 SURGERY — CAPSULECTOMY, BREAST, WITH REPLACEMENT OF IMPLANT
Anesthesia: General | Site: Chest | Laterality: Right

## 2017-12-06 MED ORDER — LACTATED RINGERS IV SOLN: INTRAVENOUS | Status: DC

## 2017-12-06 MED ORDER — NEOSTIGMINE METHYLSULFATE 10 MG/10ML IV SOLN
INTRAVENOUS | Status: DC | PRN
  Administered 2017-12-06: 3 mg via INTRAVENOUS

## 2017-12-06 MED ORDER — DEXAMETHASONE SODIUM PHOSPHATE 4 MG/ML IJ SOLN
INTRAMUSCULAR | Status: DC | PRN
  Administered 2017-12-06: 10 mg via INTRAVENOUS

## 2017-12-06 MED ORDER — ACETAMINOPHEN 325 MG PO TABS
650.0000 mg | ORAL_TABLET | ORAL | Status: DC | PRN
Start: 2017-12-06 — End: 2017-12-06

## 2017-12-06 MED ORDER — HYDROMORPHONE HCL 1 MG/ML IJ SOLN
INTRAMUSCULAR | Status: AC
  Filled 2017-12-06: qty 0.5

## 2017-12-06 MED ORDER — MEPERIDINE HCL 25 MG/ML IJ SOLN: 6.2500 mg | INTRAMUSCULAR | Status: DC | PRN

## 2017-12-06 MED ORDER — SODIUM CHLORIDE 0.9% FLUSH: 3.0000 mL | INTRAVENOUS | Status: DC | PRN

## 2017-12-06 MED ORDER — BUPIVACAINE-EPINEPHRINE 0.25% -1:200000 IJ SOLN
INTRAMUSCULAR | Status: DC | PRN
  Administered 2017-12-06: 19 mL

## 2017-12-06 MED ORDER — MIDAZOLAM HCL 2 MG/2ML IJ SOLN
INTRAMUSCULAR | Status: AC
  Filled 2017-12-06: qty 2

## 2017-12-06 MED ORDER — BUPIVACAINE-EPINEPHRINE (PF) 0.25% -1:200000 IJ SOLN
INTRAMUSCULAR | Status: AC
  Filled 2017-12-06: qty 30

## 2017-12-06 MED ORDER — PROPOFOL 10 MG/ML IV BOLUS
INTRAVENOUS | Status: AC
  Filled 2017-12-06: qty 40

## 2017-12-06 MED ORDER — LIDOCAINE HCL (CARDIAC) 20 MG/ML IV SOLN
INTRAVENOUS | Status: DC | PRN
  Administered 2017-12-06: 50 mg via INTRAVENOUS

## 2017-12-06 MED ORDER — LACTATED RINGERS IV SOLN
INTRAVENOUS | Status: DC
  Administered 2017-12-06 (×2): via INTRAVENOUS

## 2017-12-06 MED ORDER — DEXAMETHASONE SODIUM PHOSPHATE 10 MG/ML IJ SOLN
INTRAMUSCULAR | Status: AC
Start: 2017-12-06 — End: 2017-12-06
  Filled 2017-12-06: qty 1

## 2017-12-06 MED ORDER — PROMETHAZINE HCL 25 MG/ML IJ SOLN: 6.2500 mg | INTRAMUSCULAR | Status: DC | PRN

## 2017-12-06 MED ORDER — MIDAZOLAM HCL 2 MG/2ML IJ SOLN
1.0000 mg | INTRAMUSCULAR | Status: DC | PRN
  Administered 2017-12-06: 2 mg via INTRAVENOUS

## 2017-12-06 MED ORDER — ONDANSETRON HCL 4 MG/2ML IJ SOLN
INTRAMUSCULAR | Status: DC | PRN
  Administered 2017-12-06: 4 mg via INTRAVENOUS

## 2017-12-06 MED ORDER — ROCURONIUM BROMIDE 10 MG/ML (PF) SYRINGE
PREFILLED_SYRINGE | INTRAVENOUS | Status: AC
  Filled 2017-12-06: qty 5

## 2017-12-06 MED ORDER — OXYCODONE HCL 5 MG PO TABS: 5.0000 mg | ORAL_TABLET | Freq: Once | ORAL | Status: DC | PRN

## 2017-12-06 MED ORDER — SODIUM CHLORIDE 0.9 % IV SOLN
INTRAVENOUS | Status: AC
  Filled 2017-12-06: qty 500000

## 2017-12-06 MED ORDER — FENTANYL CITRATE (PF) 100 MCG/2ML IJ SOLN
50.0000 ug | INTRAMUSCULAR | Status: DC | PRN
  Administered 2017-12-06: 100 ug via INTRAVENOUS

## 2017-12-06 MED ORDER — CIPROFLOXACIN IN D5W 400 MG/200ML IV SOLN
INTRAVENOUS | Status: AC
Start: 2017-12-06 — End: 2017-12-06
  Filled 2017-12-06: qty 200

## 2017-12-06 MED ORDER — SODIUM CHLORIDE 0.9 % IV SOLN: 250.0000 mL | INTRAVENOUS | Status: DC | PRN

## 2017-12-06 MED ORDER — HYDROMORPHONE HCL 1 MG/ML IJ SOLN
0.2500 mg | INTRAMUSCULAR | Status: DC | PRN
  Administered 2017-12-06 (×3): 0.5 mg via INTRAVENOUS

## 2017-12-06 MED ORDER — OXYCODONE HCL 5 MG/5ML PO SOLN: 5.0000 mg | Freq: Once | ORAL | Status: DC | PRN

## 2017-12-06 MED ORDER — PHENYLEPHRINE HCL 10 MG/ML IJ SOLN
INTRAMUSCULAR | Status: DC | PRN
  Administered 2017-12-06: 40 ug/min via INTRAVENOUS

## 2017-12-06 MED ORDER — SODIUM CHLORIDE 0.9% FLUSH: 3.0000 mL | Freq: Two times a day (BID) | INTRAVENOUS | Status: DC

## 2017-12-06 MED ORDER — SODIUM CHLORIDE 0.9 % IV SOLN
INTRAVENOUS | Status: DC | PRN
  Administered 2017-12-06: 500 mL

## 2017-12-06 MED ORDER — LIDOCAINE HCL (CARDIAC) 20 MG/ML IV SOLN
INTRAVENOUS | Status: AC
  Filled 2017-12-06: qty 5

## 2017-12-06 MED ORDER — CIPROFLOXACIN IN D5W 400 MG/200ML IV SOLN: 400.0000 mg | INTRAVENOUS | Status: DC

## 2017-12-06 MED ORDER — PROPOFOL 10 MG/ML IV BOLUS
INTRAVENOUS | Status: DC | PRN
  Administered 2017-12-06: 130 mg via INTRAVENOUS

## 2017-12-06 MED ORDER — ACETAMINOPHEN 650 MG RE SUPP: 650.0000 mg | RECTAL | Status: DC | PRN

## 2017-12-06 MED ORDER — GLYCOPYRROLATE 0.2 MG/ML IJ SOLN
INTRAMUSCULAR | Status: DC | PRN
  Administered 2017-12-06: .6 mg via INTRAVENOUS

## 2017-12-06 MED ORDER — ONDANSETRON HCL 4 MG/2ML IJ SOLN
INTRAMUSCULAR | Status: AC
  Filled 2017-12-06: qty 2

## 2017-12-06 MED ORDER — NEOSTIGMINE METHYLSULFATE 5 MG/5ML IV SOSY
PREFILLED_SYRINGE | INTRAVENOUS | Status: AC
  Filled 2017-12-06: qty 5

## 2017-12-06 MED ORDER — SCOPOLAMINE 1 MG/3DAYS TD PT72: 1.0000 | MEDICATED_PATCH | Freq: Once | TRANSDERMAL | Status: DC | PRN

## 2017-12-06 MED ORDER — FENTANYL CITRATE (PF) 100 MCG/2ML IJ SOLN
INTRAMUSCULAR | Status: AC
  Filled 2017-12-06: qty 2

## 2017-12-06 MED ORDER — ROCURONIUM BROMIDE 100 MG/10ML IV SOLN
INTRAVENOUS | Status: DC | PRN
  Administered 2017-12-06: 50 mg via INTRAVENOUS

## 2017-12-06 SURGICAL SUPPLY — 68 items
BAG DECANTER FOR FLEXI CONT (MISCELLANEOUS) ×3 IMPLANT
BINDER BREAST LRG (GAUZE/BANDAGES/DRESSINGS) IMPLANT
BINDER BREAST MEDIUM (GAUZE/BANDAGES/DRESSINGS) IMPLANT
BIOPATCH RED 1 DISK 7.0 (GAUZE/BANDAGES/DRESSINGS) IMPLANT
BLADE HEX COATED 2.75 (ELECTRODE) ×3 IMPLANT
BLADE SURG 15 STRL LF DISP TIS (BLADE) ×4 IMPLANT
BLADE SURG 15 STRL SS (BLADE) ×2
BNDG GAUZE ELAST 4 BULKY (GAUZE/BANDAGES/DRESSINGS) IMPLANT
CANISTER SUCT 1200ML W/VALVE (MISCELLANEOUS) ×3 IMPLANT
CHLORAPREP W/TINT 26ML (MISCELLANEOUS) ×3 IMPLANT
COVER BACK TABLE 60X90IN (DRAPES) ×3 IMPLANT
COVER MAYO STAND STRL (DRAPES) ×3 IMPLANT
DECANTER SPIKE VIAL GLASS SM (MISCELLANEOUS) IMPLANT
DERMABOND ADVANCED (GAUZE/BANDAGES/DRESSINGS) ×2
DERMABOND ADVANCED .7 DNX12 (GAUZE/BANDAGES/DRESSINGS) ×4 IMPLANT
DRAIN CHANNEL 19F RND (DRAIN) IMPLANT
DRAPE LAPAROSCOPIC ABDOMINAL (DRAPES) ×3 IMPLANT
DRSG PAD ABDOMINAL 8X10 ST (GAUZE/BANDAGES/DRESSINGS) ×6 IMPLANT
ELECT BLADE 4.0 EZ CLEAN MEGAD (MISCELLANEOUS) ×3
ELECT BLADE 6.5 .24CM SHAFT (ELECTRODE) IMPLANT
ELECT REM PT RETURN 9FT ADLT (ELECTROSURGICAL) ×3
ELECTRODE BLDE 4.0 EZ CLN MEGD (MISCELLANEOUS) ×2 IMPLANT
ELECTRODE REM PT RTRN 9FT ADLT (ELECTROSURGICAL) ×2 IMPLANT
EVACUATOR SILICONE 100CC (DRAIN) IMPLANT
GAUZE SPONGE 4X4 12PLY STRL LF (GAUZE/BANDAGES/DRESSINGS) IMPLANT
GLOVE BIO SURGEON STRL SZ 6.5 (GLOVE) ×6 IMPLANT
GLOVE BIO SURGEON STRL SZ7 (GLOVE) ×3 IMPLANT
GLOVE BIOGEL PI IND STRL 7.0 (GLOVE) ×2 IMPLANT
GLOVE BIOGEL PI INDICATOR 7.0 (GLOVE) ×1
GOWN STRL REUS W/ TWL LRG LVL3 (GOWN DISPOSABLE) ×4 IMPLANT
GOWN STRL REUS W/TWL LRG LVL3 (GOWN DISPOSABLE) ×2
IMPL GEL 300CC (Breast) ×4 IMPLANT
IMPLANT GEL 300CC (Breast) ×6 IMPLANT
IV NS 1000ML (IV SOLUTION)
IV NS 1000ML BAXH (IV SOLUTION) IMPLANT
IV NS 500ML (IV SOLUTION)
IV NS 500ML BAXH (IV SOLUTION) IMPLANT
KIT FILL SYSTEM UNIVERSAL (SET/KITS/TRAYS/PACK) IMPLANT
NDL SAFETY ECLIPSE 18X1.5 (NEEDLE) ×2 IMPLANT
NEEDLE HYPO 18GX1.5 SHARP (NEEDLE) ×1
NEEDLE HYPO 25X1 1.5 SAFETY (NEEDLE) ×3 IMPLANT
NEEDLE SPNL 18GX3.5 QUINCKE PK (NEEDLE) IMPLANT
NS IRRIG 1000ML POUR BTL (IV SOLUTION) IMPLANT
PACK BASIN DAY SURGERY FS (CUSTOM PROCEDURE TRAY) ×3 IMPLANT
PENCIL BUTTON HOLSTER BLD 10FT (ELECTRODE) ×3 IMPLANT
PIN SAFETY STERILE (MISCELLANEOUS) IMPLANT
SIZER BREAST GEL REUSE 325CC (SIZER) ×3
SIZER BRST GEL REUSE 325CC (SIZER) ×2 IMPLANT
SLEEVE SCD COMPRESS KNEE MED (MISCELLANEOUS) ×3 IMPLANT
SPONGE LAP 18X18 RF (DISPOSABLE) ×6 IMPLANT
SUT MNCRL AB 4-0 PS2 18 (SUTURE) ×6 IMPLANT
SUT MON AB 3-0 SH 27 (SUTURE) ×3
SUT MON AB 3-0 SH27 (SUTURE) ×6 IMPLANT
SUT MON AB 5-0 PS2 18 (SUTURE) ×6 IMPLANT
SUT PDS 3-0 CT2 (SUTURE)
SUT PDS AB 2-0 CT2 27 (SUTURE) IMPLANT
SUT PDS II 3-0 CT2 27 ABS (SUTURE) IMPLANT
SUT SILK 3 0 PS 1 (SUTURE) IMPLANT
SUT VIC AB 3-0 SH 27 (SUTURE)
SUT VIC AB 3-0 SH 27X BRD (SUTURE) IMPLANT
SUT VICRYL 4-0 PS2 18IN ABS (SUTURE) ×3 IMPLANT
SYR 50ML LL SCALE MARK (SYRINGE) IMPLANT
SYR BULB IRRIGATION 50ML (SYRINGE) ×3 IMPLANT
SYR CONTROL 10ML LL (SYRINGE) ×3 IMPLANT
TOWEL OR 17X24 6PK STRL BLUE (TOWEL DISPOSABLE) ×6 IMPLANT
TUBE CONNECTING 20X1/4 (TUBING) ×3 IMPLANT
UNDERPAD 30X30 (UNDERPADS AND DIAPERS) ×6 IMPLANT
YANKAUER SUCT BULB TIP NO VENT (SUCTIONS) ×3 IMPLANT

## 2017-12-06 NOTE — Anesthesia Preprocedure Evaluation (Addendum)
Anesthesia Evaluation  Patient identified by MRN, date of birth, ID band Patient awake    Reviewed: Allergy & Precautions, NPO status , Patient's Chart, lab work & pertinent test results  Airway Mallampati: I  TM Distance: >3 FB Neck ROM: Full    Dental  (+) Teeth Intact, Dental Advisory Given   Pulmonary former smoker,    breath sounds clear to auscultation       Cardiovascular negative cardio ROS   Rhythm:Regular Rate:Normal     Neuro/Psych  Headaches, Anxiety    GI/Hepatic negative GI ROS, Neg liver ROS,   Endo/Other  negative endocrine ROS  Renal/GU negative Renal ROS     Musculoskeletal negative musculoskeletal ROS (+)   Abdominal Normal abdominal exam  (+)   Peds  Hematology   Anesthesia Other Findings   Reproductive/Obstetrics                            Anesthesia Physical Anesthesia Plan  ASA: II  Anesthesia Plan: General   Post-op Pain Management:    Induction: Intravenous  PONV Risk Score and Plan: 4 or greater and Ondansetron, Dexamethasone, Midazolam and Scopolamine patch - Pre-op  Airway Management Planned: Oral ETT  Additional Equipment: None  Intra-op Plan:   Post-operative Plan: Extubation in OR  Informed Consent: I have reviewed the patients History and Physical, chart, labs and discussed the procedure including the risks, benefits and alternatives for the proposed anesthesia with the patient or authorized representative who has indicated his/her understanding and acceptance.   Dental advisory given  Plan Discussed with: CRNA  Anesthesia Plan Comments:        Anesthesia Quick Evaluation

## 2017-12-06 NOTE — H&P (Signed)
Madison Savage is an 47 y.o. female.   Chief Complaint: acquired absence of bilateral breasts HPI: The patient is a 47 y.o. yrs old wf here with her husband for breast expander removal and implant placement. She is happy with her current size.  She would like silicone implants.  She has asked me to remove the port as well.  There is thinning of the skin over the left side of the superior aspect of the port.  She has not had any recent illnesses.  Radiation is scheduled to start shortly after exchange. Current expanders have 230 cc in 225 cc [place].  History:  She underwent immediate bilateral breast reconstruction with tissue expander and dermal matrix on 04/27/17.  She is anticipating radiation following the completion of chemotherapy.  She was on vacation when she felt a mass under her arm in the left axilla.  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  She is BRCA2 positive.  The axillary nodes were involved.  She is 5 feet 7 inches tall, weight is 117 pounds, preop bra= 32 A/B.  She would like to be a B/C.  She is very active with daily exercise and work outs.  She has 3 children.    Past Medical History:  Diagnosis Date  . Anemia   . Anxiety   . Cancer ([place]) 03/2017   left breast cancer  . Chronic ITP (idiopathic thrombocytopenia) (HCC)   . Family history of breast cancer   . Family history of ovarian cancer   . Headache    last migraine...9/24  . Heart murmur    at birth.  none now  . MTHFR mutation [facility])     Past Surgical History:  Procedure Laterality Date  . AXILLARY LYMPH NODE DISSECTION Left 04/27/2017   Procedure: AXILLARY LYMPH NODE DISSECTION;  Surgeon: [name], MD;  Location: [place];  Service: General;  Laterality: Left;  . BILATERAL TOTAL MASTECTOMY WITH AXILLARY LYMPH NODE DISSECTION Bilateral 04/27/2017   Procedure: SKIN SPARING  MASTECTOMY;  Surgeon: [name], MD;  Location: [place];  Service: General;  Laterality: Bilateral;  . BREAST RECONSTRUCTION WITH PLACEMENT OF TISSUE EXPANDER AND FLEX HD (ACELLULAR HYDRATED DERMIS) Bilateral 04/27/2017   Procedure: BILATERAL BREAST RECONSTRUCTION WITH PLACEMENT OF TISSUE EXPANDER AND FLEX HD (ACELLULAR HYDRATED DERMIS);  Surgeon: Wallace Going, DO;  Location: [place];  Service: Plastics;  Laterality: Bilateral;  . BREAST SURGERY    . MASTECTOMY    . PORTACATH PLACEMENT Right 06/01/2017   Procedure: INSERTION PORT-A-CATH RIGHT SUBCLAVIAN;  Surgeon: [name], MD;  Location: [place];  Service: General;  Laterality: Right;  . WISDOM TOOTH EXTRACTION      Family History  Problem Relation Age of Onset  . Breast cancer Paternal Aunt 49  . Ovarian cancer Paternal Aunt 55  . Cancer Paternal Grandmother        breast cancer   . Cancer Maternal Aunt        lymphoma   . Cancer Paternal Grandfather        throat cancer   . Cancer Paternal Aunt        lung cancer  . Cirrhosis Father   . AAA (abdominal aortic aneurysm) Maternal Grandfather   . Leukemia Paternal Aunt    Social History:  reports that she quit smoking about 9 months ago. Her smoking use  included cigarettes. She has a 10.00 pack-year smoking history. She has never used smokeless tobacco. She reports that she does not drink alcohol or use drugs.  Allergies:  Allergies  Allergen Reactions  . Clindamycin Anaphylaxis and Other (See Comments)    REACTION: rash, sob  . Penicillins Anaphylaxis and Other (See Comments)    REACTION: rash  . Codeine Other (See Comments)    REACTION: sleepless,itches    Medications Prior to Admission  Medication Sig Dispense Refill  . ALPRAZolam (XANAX) 0.25 MG tablet Take 1 tablet (0.25 mg total) by mouth at bedtime as needed for anxiety. 20 tablet 0  . b complex vitamins capsule Take 1 capsule by mouth daily.    . Multiple  Vitamins-Calcium (ONE-A-DAY WOMENS PO) Take 1 tablet by mouth daily.    Marland Kitchen OVER THE COUNTER MEDICATION Take 1 capsule by mouth daily. Biotin/MSM    . diazepam (VALIUM) 2 MG tablet Take 1 tablet (2 mg total) by mouth every 6 (six) hours as needed for anxiety or muscle spasms. 30 tablet 0    No results found for this or any previous visit (from the past 48 hour(s)). No results found.  Review of Systems  Constitutional: Negative.  Negative for fever.  HENT: Negative.   Eyes: Negative.   Respiratory: Negative.   Cardiovascular: Negative.   Gastrointestinal: Negative.   Genitourinary: Negative.   Musculoskeletal: Negative.   Skin: Negative.   Neurological: Negative.   Psychiatric/Behavioral: Negative.     Blood pressure 98/62, pulse 67, temperature 98 F (36.7 C), temperature source Oral, resp. rate 18, height '5\' 7"'$  (1.702 m), weight 56.2 kg (124 lb), SpO2 96 %. Physical Exam  Constitutional: She is oriented to person, place, and time. She appears well-developed and well-nourished.  HENT:  Head: Normocephalic and atraumatic.  Eyes: Pupils are equal, round, and reactive to light. EOM are normal.  Cardiovascular: Normal rate.  Respiratory: Effort normal. No respiratory distress.  GI: Soft. She exhibits no distension.  Neurological: She is alert and oriented to person, place, and time.  Skin: Skin is warm. No rash noted. No erythema.  Psychiatric: She has a normal mood and affect. Her behavior is normal. Judgment and thought content normal.     Assessment/Plan Port removal and removal of bilateral expanders with placement of bilateral silicone implants.  Riddle, DO 12/06/2017, 8:07 AM

## 2017-12-06 NOTE — Op Note (Signed)
Op report Bilateral Exchange   DATE OF OPERATION: 12/06/2017  LOCATION: [place]  SURGICAL DIVISION: Plastic Surgery  PREOPERATIVE DIAGNOSES:  1.History of breast cancer.  2. Acquired absence of bilateral breast.   POSTOPERATIVE DIAGNOSES:  1. History of breast cancer.  2. Acquired absence of bilateral breast.   PROCEDURE:  1. Bilateral exchange of tissue expanders for implants.  2. Bilateral capsulotomies for implant respositioning. 3. Removal of right port-a-cath.  SURGEON: Raenell Mensing Sanger Renuka Farfan, DO  ANESTHESIA:  General.   COMPLICATIONS: None.   IMPLANTS: Left - Mentor Smooth Round High Profile Gel 300cc. Ref #[id number].  Serial Number [id number] - Mentor Smooth Round High Profile Gel 300cc. Ref #[id number].  Serial Number [id number]  INDICATIONS FOR PROCEDURE:  The patient, Madison Savage, is a 47 y.o. female born on March 01, 1971, is here for treatment after bilateral mastectomies.  She had tissue expanders placed at the time of mastectomies. She now presents for exchange of her expanders for implants.  She requires capsulotomies to better position the implants. MRN: [id number]  CONSENT:  Informed consent was obtained directly from the patient. Risks, benefits and alternatives were fully discussed. Specific risks including but not limited to bleeding, infection, hematoma, seroma, scarring, pain, implant infection, implant extrusion, capsular contracture, asymmetry, wound healing problems, and need for further surgery were all discussed. The patient did have an ample opportunity to have her questions answered to her satisfaction.   DESCRIPTION OF PROCEDURE:  The patient was taken to the operating room. SCDs were placed and IV antibiotics were given. The patient's chest was prepped and draped in a sterile fashion. A time out was performed and the implants to be used were identified.    On the right breast: One percent Lidocaine with epinephrine  was used to infiltrate at the incision site. The old mastectomy scar was excised.  The mastectomy flaps from the superior and inferior flaps were raised over the pectoralis major muscle for several centimeters to minimize tension for the closure. The pectoralis was split inferior to the skin incision to expose and remove the tissue expander.  Inspection of the pocket showed a normal healthy capsule and good integration of the biologic matrix.  The pocket was irrigated with antibiotic solution.  Circumferential capsulotomies were performed to allow for breast pocket expansion.  Measurements were made and a sizer used to confirm adequate pocket size for the implant dimensions.  Hemostasis was ensured with electrocautery. New gloves were placed. The implant was soaked in antibiotic solution and then placed in the pocket and oriented appropriately. The pectoralis major muscle and capsule on the anterior surface were re-closed with a 3-0 Monocryl suture. The remaining skin was closed with 4-0 Monocryl deep dermal and 5-0 Monocryl subcuticular stitches.   On the left breast: The old mastectomy scar was excised.  The mastectomy flaps from the superior and inferior flaps were raised over the pectoralis major muscle for several centimeters to minimize tension for the closure. The pectoralis was split inferior to the skin incision to expose and remove the tissue expander.  Inspection of the pocket showed a normal healthy capsule and good integration of the biologic matrix.   Circumferential capsulotomies were performed to allow for breast pocket expansion.  Measurements were made and a sizer utilized to confirm adequate pocket size for the implant dimensions.  Hemostasis was ensured with the electrocautery.  New gloves were applied. The implant was soaked in antibiotic solution and placed in the pocket and oriented appropriately. The pectoralis  major muscle and capsule on the anterior surface were re-closed with a 3-0  Monocryl suture. The remaining skin was closed with 4-0 Monocryl deep dermal and 5-0 Monocryl subcuticular stitches.  Dermabond was applied to the incision site.   Local was injected at the previous incision site of the port-a-cath.  The #15 blade was used to make an incision at the previous incision site. The scissors were used to dissect to the catheter.  The sutures were removed.  There was a cyst right under the skin and this was excised as well.  It was 3 mm in size.  The patient was placed in trendelenburg.  A vicryl suture was placed around the tube.  The tube was removed and the stitch cinched and pressure held.  The patient was brought out of trendelenburg.  The deep layers were closed with 3-0 Monocryl and then 4-0 Monocryl.   A breast binder and ABDs were placed.  The patient was allowed to wake from anesthesia and taken to the recovery room in satisfactory condition.

## 2017-12-06 NOTE — Anesthesia Postprocedure Evaluation (Signed)
Anesthesia Post Note  Patient: Madison Savage  Procedure(s) Performed: BREAST CAPSULECTOMY WITH IMPLANT EXCHANGE (Bilateral Breast) PLACEMENT OF BREAST IMPLANTS (Bilateral Breast) REMOVAL PORT-A-CATH (Right Chest)     Patient location during evaluation: PACU Anesthesia Type: General Level of consciousness: awake and alert Pain management: pain level controlled Vital Signs Assessment: post-procedure vital signs reviewed and stable Respiratory status: spontaneous breathing, nonlabored ventilation, respiratory function stable and patient connected to nasal cannula oxygen Cardiovascular status: blood pressure returned to baseline and stable Postop Assessment: no apparent nausea or vomiting Anesthetic complications: no    Last Vitals:  Vitals:   12/06/17 1140 12/06/17 1215  BP: 117/74 117/74  Pulse: (!) 59 63  Resp: 16 16  Temp:  36.5 C  SpO2: 96% 97%    Last Pain:  Vitals:   12/06/17 1215  TempSrc:   PainSc: 8                  Effie Berkshire

## 2017-12-06 NOTE — Anesthesia Procedure Notes (Signed)
Procedure Name: Intubation Performed by: Verita Lamb, CRNA Pre-anesthesia Checklist: Patient identified, Emergency Drugs available, Suction available, Patient being monitored and Timeout performed Patient Re-evaluated:Patient Re-evaluated prior to induction Oxygen Delivery Method: Circle system utilized Induction Type: IV induction Ventilation: Mask ventilation without difficulty Laryngoscope Size: Mac and 3 Grade View: Grade I Tube type: Oral Tube size: 7.0 mm Number of attempts: 1 Airway Equipment and Method: Stylet Placement Confirmation: ETT inserted through vocal cords under direct vision,  CO2 detector,  positive ETCO2 and breath sounds checked- equal and bilateral Secured at: 22 cm Tube secured with: Tape Dental Injury: Teeth and Oropharynx as per pre-operative assessment

## 2017-12-06 NOTE — Discharge Instructions (Signed)
No heavy lifting May shower tomorrow Continue sports bra or binder   Post Anesthesia Home Care Instructions  Activity: Get plenty of rest for the remainder of the day. A responsible individual must stay with you for 24 hours following the procedure.  For the next 24 hours, DO NOT: -Drive a car -Paediatric nurse -Drink alcoholic beverages -Take any medication unless instructed by your physician -Make any legal decisions or sign important papers.  Meals: Start with liquid foods such as gelatin or soup. Progress to regular foods as tolerated. Avoid greasy, spicy, heavy foods. If nausea and/or vomiting occur, drink only clear liquids until the nausea and/or vomiting subsides. Call your physician if vomiting continues.  Special Instructions/Symptoms: Your throat may feel dry or sore from the anesthesia or the breathing tube placed in your throat during surgery. If this causes discomfort, gargle with warm salt water. The discomfort should disappear within 24 hours.  If you had a scopolamine patch placed behind your ear for the management of post- operative nausea and/or vomiting:  1. The medication in the patch is effective for 72 hours, after which it should be removed.  Wrap patch in a tissue and discard in the trash. Wash hands thoroughly with soap and water. 2. You may remove the patch earlier than 72 hours if you experience unpleasant side effects which may include dry mouth, dizziness or visual disturbances. 3. Avoid touching the patch. Wash your hands with soap and water after contact with the patch.

## 2017-12-06 NOTE — Transfer of Care (Signed)
Immediate Anesthesia Transfer of Care Note  Patient: Madison Savage  Procedure(s) Performed: BREAST CAPSULECTOMY WITH IMPLANT EXCHANGE (Bilateral Breast) PLACEMENT OF BREAST IMPLANTS (Bilateral Breast) REMOVAL PORT-A-CATH (Right Chest)  Patient Location: PACU  Anesthesia Type:General  Level of Consciousness: awake, alert  and oriented  Airway & Oxygen Therapy: Patient Spontanous Breathing and Patient connected to face mask oxygen  Post-op Assessment: Report given to RN and Post -op Vital signs reviewed and stable  Post vital signs: Reviewed and stable  Last Vitals:  Vitals Value Taken Time  BP 107/51 12/06/2017 10:32 AM  Temp    Pulse 69 12/06/2017 10:33 AM  Resp 18 12/06/2017 10:33 AM  SpO2 100 % 12/06/2017 10:33 AM  Vitals shown include unvalidated device data.  Last Pain:  Vitals:   12/06/17 0725  TempSrc: Oral  PainSc: 0-No pain      Patients Stated Pain Goal: 1 (19/62/22 9798)  Complications: No apparent anesthesia complications

## 2017-12-08 ENCOUNTER — Encounter: Payer: BLUE CROSS/BLUE SHIELD | Admitting: Adult Health

## 2017-12-08 ENCOUNTER — Encounter (HOSPITAL_BASED_OUTPATIENT_CLINIC_OR_DEPARTMENT_OTHER): Payer: Self-pay | Admitting: Plastic Surgery

## 2017-12-12 ENCOUNTER — Telehealth: Payer: Self-pay

## 2017-12-12 NOTE — Telephone Encounter (Signed)
Spoke with patient reminding of SCP visit with [place] on 12/18/17 at 10 am.  Patient said she would come to appt.

## 2017-12-13 NOTE — Progress Notes (Signed)
Location of Breast Cancer:Breast cancer metastasized to axillary lymph node, left    Histology per Pathology Report:  Diagnosis 03-27-17 1. Breast, left, needle core biopsy - FIBROADENOMATOID NODULE WITH CALCIFICATIONS - NO MALIGNANCY IDENTIFIED 2. Breast, left, needle core biopsy - DUCTAL CARCINOMA IN SITU, HIGH GRADE Diagnosis(continued) 3. Breast, right, needle core biopsy - FIBROADENOMA - NO MALIGNANCY IDENTIFIED Receptor Status: ER(95 % +), PR (10% ), Her2-neu (), Ki-()   Diagnosis 03-22-17 Lymph node, needle/core biopsy, left axillary node - METASTATIC CARCINOMA, SEE COMMENT.  Receptor Status: ER(95 % +), PR (5% ), Her2-neu (-), Ki-(15%)  Did patient present with symptoms (if so, please note symptoms) or was this found on screening mammography?:She felt the lump herself June 22nd. It was soft and "squishy" and it moved. First in the left breast and then four days latter in the left axilla.    Past/Anticipated interventions by surgeon, if any: 12-06-17 Audelia Hives before radiation  BREAST CAPSULECTOMY WITH IMPLANT EXCHANGE  PLACEMENT OF BREAST IMPLANTS  REMOVAL PORT-A-CATH    06-01-17 Port-A-cath insertion  Diagnosis 04-27-17 Dr. [name]  1. Breast, simple mastectomy, Right - FIBROCYSTIC CHANGES WITH ADENOSIS. - USUAL DUCTAL HYPERPLASIA. - HEALING BIOPSY SITE. - THERE IS NO EVIDENCE OF MALIGNANCY. 2. Breast, simple mastectomy, Left - INVASIVE DUCTAL CARCINOMA, GRADE II/III, SPANNING 1.2 CM. - DUCTAL CARCINOMA IN SITU WITH CALCIFICATIONS, HIGH GRADE. - INVASIVE DUCTAL CARCINOMA IS FOCALLY PRESENT AT THE ANTERIOR MARGIN. - DUCTAL CARCINOMA IN SITU IS BROADLY 0.1 CM TO THE POSTERIOR MARGIN. - SEE ONCOLOGY TABLE BELOW. 3. Lymph nodes, regional resection, Left axillary - METASTATIC CARCINOMA IN 8 OF 21 LYMPH NODES (8/21).  Receptor Status: ER(95 % +), PR (5% ), Her2-neu (-), Ki-(15%     04/17/2017 Genetic Testing    BRCA2 [id number]  (p.Glu260Serfs*15) pathogenic mutation identified on the 9 gene STAT panel      Past/Anticipated interventions by medical oncology, if any: Dr. Burr Medico  Chemotherapy   CURRENT THERAPY:   Adriamycin and Cytoxan every 2 weeks, for 4 cycles 06/09/17-07/21/17, followed by weekly Abraxane and carboplatin for 12 weeks starting 08/11/17. Held Carbo with cycle 2 due to poor toleration, after cycle 2 may change Abraxane to Taxol as her insurance has denied Abraxane. Changed to Abraxane for Cycle 3 on 09/08/17. Completed on 11/16/17   Lymphedema issues, if any: No ROM to to left arm good.  Received PT in September for three months completed in December 2018 went back past surgery for massages. Skin to left breast  looks good incision area healing no signs of infection and to right breast the same.               Follow up appointment to with Dr. [name]  Pain issues, if any: 3/10 left hip pelvic lower outer abdominal area fell last night  'States she is not taking any pain medication, did not apply ice."  SAFETY ISSUES:  Prior radiation? No  Pacemaker/ICD? :No  Possible current pregnancy? :No  Is the patient on methotrexate? : No  Menarche 77 G7 P3 3 miscarriages, 1 abortion  BC No  Menopause  chemotheherapy   HRT N/A   Current Complaints / other details:    She notes she would like to decline Zoladex injections as she plans to proceed with total hysterectomy in 04/2018. She notes she had low AMH and understood. Her menopause may occur sooner. She notes her hot flashes and mood swings started 7 years ago. Wt Readings from Last 3 Encounters:  12/20/17 122 lb 6.4 oz (55.5 kg)  12/18/17 123 lb 6.4 oz (56 kg)  12/06/17 124 lb (56.2 kg)  BP 99/60 (BP Location: Right Arm, Patient Position: Sitting, Cuff Size: Normal)   Pulse 81   Temp 97.7 F (36.5 C) (Oral)   Resp 20   Ht '5\' 4"'$  (1.626 m)   Wt 122 lb 6.4 oz (55.5 kg)   LMP 03/10/2017   SpO2 99%   BMI 21.01 kg/m     Georgena Spurling, RN 12/13/2017,9:11 AM

## 2017-12-13 NOTE — Progress Notes (Deleted)
Location of Breast Cancer:  Histology per Pathology Report:          Diagnosis 03-22-17 Lymph node, needle/core biopsy, left axillary node - METASTATIC CARCINOMA, SEE COMMENT.  Receptor Status: ER(95 % +), PR (5% ), Her2-neu (-), Ki-(15%)  Did patient present with symptoms (if so, please note symptoms) or was this found on screening mammography?:  Past/Anticipated interventions by surgeon, if any:  Past/Anticipated interventions by medical oncology, if any: Chemotherapy   Lymphedema issues, if any:   Pain issues, if any:   SAFETY ISSUES:  Prior radiation?   Pacemaker/ICD? :  Possible current pregnancy? :  Is the patient on methotrexate? :  Current Complaints / other details:     Madison Spurling, RN 12/13/2017,8:49 AM

## 2017-12-14 ENCOUNTER — Ambulatory Visit: Payer: BLUE CROSS/BLUE SHIELD | Admitting: Hematology

## 2017-12-14 ENCOUNTER — Ambulatory Visit: Payer: BLUE CROSS/BLUE SHIELD

## 2017-12-14 ENCOUNTER — Other Ambulatory Visit: Payer: BLUE CROSS/BLUE SHIELD

## 2017-12-15 ENCOUNTER — Encounter: Payer: Self-pay | Admitting: *Deleted

## 2017-12-18 ENCOUNTER — Inpatient Hospital Stay: Payer: BLUE CROSS/BLUE SHIELD | Attending: Hematology | Admitting: Adult Health

## 2017-12-18 ENCOUNTER — Encounter: Payer: Self-pay | Admitting: Adult Health

## 2017-12-18 ENCOUNTER — Encounter: Payer: Self-pay | Admitting: *Deleted

## 2017-12-18 VITALS — BP 111/65 | HR 81 | Temp 98.7°F | Resp 18 | Ht 64.0 in | Wt 123.4 lb

## 2017-12-18 DIAGNOSIS — Z1501 Genetic susceptibility to malignant neoplasm of breast: Secondary | ICD-10-CM | POA: Diagnosis not present

## 2017-12-18 DIAGNOSIS — Z1509 Genetic susceptibility to other malignant neoplasm: Secondary | ICD-10-CM | POA: Insufficient documentation

## 2017-12-18 DIAGNOSIS — C773 Secondary and unspecified malignant neoplasm of axilla and upper limb lymph nodes: Secondary | ICD-10-CM | POA: Insufficient documentation

## 2017-12-18 DIAGNOSIS — C50912 Malignant neoplasm of unspecified site of left female breast: Secondary | ICD-10-CM | POA: Diagnosis not present

## 2017-12-18 NOTE — Progress Notes (Signed)
CLINIC:  Survivorship   REASON FOR VISIT:  Routine follow-up post-treatment for a recent history of breast cancer.  BRIEF ONCOLOGIC HISTORY:  Oncology History   Cancer Staging Breast cancer metastasized to axillary lymph node, left ([place]) Staging form: Breast, AJCC 8th Edition - Clinical stage from 03/22/2017: Stage Unknown (cTX, cN1, cM0, GX, ER: Positive, PR: Positive, HER2: Negative) - Signed by [name], MD on 03/29/2017 - Pathologic stage from 04/27/2017: Stage IB (pT1c, pN2a, cM0, G2, ER: Positive, PR: Positive, HER2: Negative) - Signed by [name], MD on 05/25/2017 \     Breast cancer metastasized to axillary lymph node, left ([place])   03/21/2017 Mammogram    Diagnostic mammogram bilateral 03/21/17 IMPRESSION: INCOMPLETE - ADDITIONAL IMAGING EVALUATION NEEDED 1. The 0.6 cm oval mass in the right breast at 11 o'clock middle depth is indeterminate. An ultrasound is recommended.  2. The 0.5cm round focal asymmetry in the left breast central to the nipple middle depth is indeterminate. An ultrasound is recommended.  3. Ultrasound of the palpable abnormalities in the axilla and far left lateral breast is recommended.  4. Multiple clusters of pleomorphic calcifications in the left upper outer breast anterior depth spanning a 2.5 cm area are highly suggestive of malignancy. A stereotactic biopsy is recommended.        03/21/2017 Imaging    US Breast bilateral 03/21/17 IMPRESSION: HIGHLY SUGGESTIVE OF MALIGNANCY 1. Six distinct abnormal left axillary nodes, 2 of which correspond to the palpable lumps felt by the patient. Findings concerning for metastatic adenopathy. Ultrasound guided biopsy of one of these nodes is recommended.  2. Small 5 mm palpable, oval mass in the left breast 3:00 areolar margin is at an intermediate suspicion. Ultrasound guided biopsy is recommended.  3. Isoechoic 8 mm mass in the right breast 10:00 5 cm from the nipple is also an intermediate suspicion for  malignancy, and ultrasound guided biopsy is recommended.  4. Stereotactic guided biopsy for the highly suspicious calcifications in the left breast also recommended as detailed in the mammography report.        03/22/2017 Initial Biopsy    Diagnosis 03/22/17 Lymph node, needle/core biopsy, left axillary node -METASTATIC CARCINOMA, SEE COMMENT Microscopic Comment      03/22/2017 Receptors her2    Lymph node biopsy ER 95% positive, strong staining, PR 5% positive, weak staining, HER-2 negative, with HER2/CEP17 ratio 1.23 and copy #3.95.      03/27/2017 Pathology Results    Diagnosis 03/27/17 1. Breast, left, needle core biopsy -FIBROADENOMATOID NODULE WITH CALCIFICATIONS 2.Breast, left, needle core biopsy -DUCTAL CARCINOMA IN SITU, HIGH GRADE -SEE COMMENT  3. Breast, right, needle, core biopsy -FIBROADENOMA -NO MALIGNANCY IDENTIFIED       03/29/2017 Initial Diagnosis    Breast cancer metastasized to axillary lymph node, left ([place])      04/07/2017 Imaging    CT CAP 04/07/17 IMPRESSION: 1. Several prominent left axillary lymph nodes which may correspond to biopsy proven axillary lymph node metastasis. 2. No thoracic adenopathy identified or evidence of distant metastatic disease.      04/07/2017 Imaging    Bone whole body scan 04/07/17 IMPRESSION: Negative for evidence of osseous metastatic disease.      04/17/2017 Genetic Testing    BRCA2 c.[id number] (p.Glu260Serfs*15) pathogenic mutation identified on the 9 gene STAT panel.  The STAT Breast cancer panel offered by [facility] includes sequencing and rearrangement analysis for the following 9 genes:  ATM, BRCA1, BRCA2, CDH1, CHEK2, PALB2, PTEN, STK11 and TP53.  The report date is April 17, 2017.  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  The Hereditary Gene Panel offered by [facility] includes sequencing and/or deletion  duplication testing of the following 46 genes: APC, ATM, AXIN2, BARD1, BMPR1A, BRCA1, BRCA2, BRIP1, CDH1, CDKN2A (p14ARF), CDKN2A (p16INK4a), CHEK2, CTNNA1, DICER1, EPCAM (Deletion/duplication testing only), GREM1 (promoter region deletion/duplication testing only), KIT, MEN1, MLH1, MSH2, MSH3, MSH6, MUTYH, NBN, NF1, NHTL1, PALB2, PDGFRA, PMS2, POLD1, POLE, PTEN, RAD50, RAD51C, RAD51D, SDHB, SDHC, SDHD, SMAD4, SMARCA4. STK11, TP53, TSC1, TSC2, and VHL.  The following genes were evaluated for sequence changes only: SDHA and HOXB13 c.251G>A variant only.  The report date is April 29, 2017.       04/27/2017 Surgery    Surgey 04/27/17 BILATERAL SKIN SPARING MASTECTOMY and LEFT AXILLARY LYMPH NODE and BILATERAL BREAST RECONSTRUCTION WITH PLACEMENT OF TISSUE EXPANDER AND FLEX HD (ACELLULAR HYDRATED DERMIS) Bilateral BY Dr. Barry Dienes and Dr. Marla Roe       04/27/2017 Pathology Results     Diagnosis  04/27/17 1. Breast, simple mastectomy, Right - FIBROCYSTIC CHANGES WITH ADENOSIS. - USUAL DUCTAL HYPERPLASIA. - HEALING BIOPSY SITE. - THERE IS NO EVIDENCE OF MALIGNANCY. 2. Breast, simple mastectomy, Left - INVASIVE DUCTAL CARCINOMA, GRADE II/III, SPANNING 1.2 CM. - DUCTAL CARCINOMA IN SITU WITH CALCIFICATIONS, HIGH GRADE. - INVASIVE DUCTAL CARCINOMA IS FOCALLY PRESENT AT THE ANTERIOR MARGIN. - DUCTAL CARCINOMA IN SITU IS BROADLY 0.1 CM TO THE POSTERIOR MARGIN. - SEE ONCOLOGY TABLE BELOW. 3. Lymph nodes, regional resection, Left axillary - METASTATIC CARCINOMA IN 8 OF 21 LYMPH NODES (8/21). - SEE COMMENT      06/09/2017 - 11/16/2017 Chemotherapy    Adriamycin and Cytoxan every 2 weeks, for 4 cycles 06/09/17-07/21/17, followed by weekly Abraxane and carboplatin for 12 weeks starting 08/11/17. Held Carbo with cycle 2 due to poor toleration. Changed to single agent Abraxane for Cycle 3 on 09/08/17. Completed on 11/16/17 with reduced Abraxane due to body aches.       12/06/2017 Surgery    BREAST CAPSULECTOMY  WITH IMPLANT EXCHANGE and PLACEMENT OF BREAST IMPLANTS and REMOVAL PORT-A-CATH  by Dr. Marla Roe on 12/06/17      12/2017 -  Anti-estrogen oral therapy    Tamoxifen, then Anastrozole after TAH/BSO       INTERVAL HISTORY:  Ms. Grant presents to the [facility] today for our initial meeting to review her survivorship care plan detailing her treatment course for breast cancer, as well as monitoring long-term side effects of that treatment, education regarding health maintenance, screening, and overall wellness and health promotion.     Overall, Ms. Kobler is doing well.  She has f/u with Dr. Lisbeth Renshaw soon to review her upcoming radiation.  She has done well with her surgery and reconstruction and is happy with it.  She is tearful however due to her diagnosis, and adjusting back to a new normal since her diagnosis and chemotherapy treatment.  She is exercising regularly, and is in a support group.   REVIEW OF SYSTEMS:  Review of Systems  Constitutional: Negative for appetite change, chills, fatigue, fever and unexpected weight change.  HENT:   Negative for hearing loss, lump/mass and trouble swallowing.   Eyes: Negative for eye problems and icterus.  Respiratory: Negative for chest tightness, cough and shortness of breath.   Cardiovascular: Negative for chest pain, leg swelling and palpitations.  Gastrointestinal: Negative for abdominal distention, abdominal pain, constipation, diarrhea, nausea and vomiting.  Skin: Negative for itching and rash.  Neurological: Negative for dizziness, extremity weakness and headaches.  Hematological: Negative for adenopathy. Does not bruise/bleed easily.  Breast: Denies any new nodularity, masses, tenderness, nipple changes, or nipple discharge.      ONCOLOGY TREATMENT TEAM:  1. Surgeon:  Dr. Barry Dienes at [facility] 2. Medical Oncologist: Dr. Burr Medico  3. Radiation Oncologist: Dr. Lisbeth Renshaw 4. Plastic Surgeon: Dr. Marla Roe    PAST  MEDICAL/SURGICAL HISTORY:  Past Medical History:  Diagnosis Date  . Anemia   . Anxiety   . Cancer ([place]) 03/2017   left breast cancer  . Chronic ITP (idiopathic thrombocytopenia) (HCC)   . Family history of breast cancer   . Family history of ovarian cancer   . Headache    last migraine...9/24  . Heart murmur    at birth.  none now  . MTHFR mutation [facility])    Past Surgical History:  Procedure Laterality Date  . AXILLARY LYMPH NODE DISSECTION Left 04/27/2017   Procedure: AXILLARY LYMPH NODE DISSECTION;  Surgeon: [name], MD;  Location: [place];  Service: General;  Laterality: Left;  . BILATERAL TOTAL MASTECTOMY WITH AXILLARY LYMPH NODE DISSECTION Bilateral 04/27/2017   Procedure: SKIN SPARING MASTECTOMY;  Surgeon: [name], MD;  Location: [place];  Service: General;  Laterality: Bilateral;  . BREAST CAPSULECTOMY WITH IMPLANT EXCHANGE Bilateral 12/06/2017   Procedure: BREAST CAPSULECTOMY WITH IMPLANT EXCHANGE;  Surgeon: Wallace Going, DO;  Location: [place];  Service: Plastics;  Laterality: Bilateral;  . BREAST RECONSTRUCTION WITH PLACEMENT OF TISSUE EXPANDER AND FLEX HD (ACELLULAR HYDRATED DERMIS) Bilateral 04/27/2017   Procedure: BILATERAL BREAST RECONSTRUCTION WITH PLACEMENT OF TISSUE EXPANDER AND FLEX HD (ACELLULAR HYDRATED DERMIS);  Surgeon: Wallace Going, DO;  Location: [place];  Service: Plastics;  Laterality: Bilateral;  . BREAST SURGERY    . MASTECTOMY    . PLACEMENT OF BREAST IMPLANTS Bilateral 12/06/2017   Procedure: PLACEMENT OF BREAST IMPLANTS;  Surgeon: Wallace Going, DO;  Location: [place];  Service: Plastics;  Laterality: Bilateral;  . PORT-A-CATH REMOVAL Right 12/06/2017   Procedure: REMOVAL PORT-A-CATH;  Surgeon: Wallace Going, DO;  Location: [place];  Service: Plastics;  Laterality: Right;  . PORTACATH PLACEMENT Right 06/01/2017    Procedure: INSERTION PORT-A-CATH RIGHT SUBCLAVIAN;  Surgeon: [name], MD;  Location: [place];  Service: General;  Laterality: Right;  . WISDOM TOOTH EXTRACTION       ALLERGIES:  Allergies  Allergen Reactions  . Clindamycin Anaphylaxis and Other (See Comments)    REACTION: rash, sob  . Penicillins Anaphylaxis and Other (See Comments)    REACTION: rash  . Codeine Other (See Comments)    REACTION: sleepless,itches     CURRENT MEDICATIONS:  Outpatient Encounter Medications as of 12/18/2017  Medication Sig  . b complex vitamins capsule Take 1 capsule by mouth daily.  . Multiple Vitamins-Calcium (ONE-A-DAY WOMENS PO) Take 1 tablet by mouth daily.  Marland Kitchen OVER THE COUNTER MEDICATION Take 1 capsule by mouth daily. Biotin/MSM  . ALPRAZolam (XANAX) 0.25 MG tablet Take 1 tablet (0.25 mg total) by mouth at bedtime as needed for anxiety. (Patient not taking: Reported on 12/18/2017)  . [DISCONTINUED] B Complex Vitamins (VITAMIN B COMPLEX PO) Take by mouth.  . [DISCONTINUED] diazepam (VALIUM) 2 MG tablet Take 1 tablet (2 mg total) by mouth every 6 (six) hours as needed for anxiety or muscle spasms. (Patient not taking: Reported on 12/18/2017)  Facility-Administered Encounter Medications as of 12/18/2017  Medication  . sodium chloride flush (NS) 0.9 % injection 10 mL     ONCOLOGIC FAMILY HISTORY:  Family History  Problem Relation Age of Onset  . Breast cancer Paternal Aunt 35  . Ovarian cancer Paternal Aunt 57  . Cancer Paternal Grandmother        breast cancer   . Cancer Maternal Aunt        lymphoma   . Cancer Paternal Grandfather        throat cancer   . Cancer Paternal Aunt        lung cancer  . Cirrhosis Father   . AAA (abdominal aortic aneurysm) Maternal Grandfather   . Leukemia Paternal Aunt      GENETIC COUNSELING/TESTING: BRCA 2 +  SOCIAL HISTORY:  Social History   Socioeconomic History  . Marital status: Married    Spouse name: Not on file  . Number of children: 3    . Years of education: Not on file  . Highest education level: Not on file  Occupational History    Employer: UNEMPLOYED  Social Needs  . Financial resource strain: Not on file  . Food insecurity:    Worry: Not on file    Inability: Not on file  . Transportation needs:    Medical: Not on file    Non-medical: Not on file  Tobacco Use  . Smoking status: Former Smoker    Packs/day: 0.50    Years: 20.00    Pack years: 10.00    Types: Cigarettes    Last attempt to quit: 03/05/2017    Years since quitting: 0.7  . Smokeless tobacco: Never Used  Substance and Sexual Activity  . Alcohol use: No  . Drug use: No  . Sexual activity: Yes    Partners: Male    Birth control/protection: None  Lifestyle  . Physical activity:    Days per week: Not on file    Minutes per session: Not on file  . Stress: Not on file  Relationships  . Social connections:    Talks on phone: Not on file    Gets together: Not on file    Attends religious service: Not on file    Active member of club or organization: Not on file    Attends meetings of clubs or organizations: Not on file    Relationship status: Not on file  . Intimate partner violence:    Fear of current or ex partner: Not on file    Emotionally abused: Not on file    Physically abused: Not on file    Forced sexual activity: Not on file  Other Topics Concern  . Not on file  Social History Narrative   Stay at home mom   3 children ages 2, 67, 84      PHYSICAL EXAMINATION:  Vital Signs:   Vitals:   12/18/17 1015  BP: 111/65  Pulse: 81  Resp: 18  Temp: 98.7 F (37.1 C)  SpO2: 99%   Filed Weights   12/18/17 1015  Weight: 123 lb 6.4 oz (56 kg)   General: Well-nourished, well-appearing female in no acute distress.  She is unaccompanied today.   HEENT: Head is normocephalic.  Pupils equal and reactive to light. Conjunctivae clear without exudate.  Sclerae anicteric. Oral mucosa is pink, moist.  Oropharynx is pink without lesions  or erythema.  Lymph: No cervical, supraclavicular, or infraclavicular lymphadenopathy noted on palpation.  Cardiovascular: Regular rate and rhythm.Marland Kitchen  Respiratory: Clear to auscultation bilaterally. Chest expansion symmetric; breathing non-labored.  GI: Abdomen soft and round; non-tender, non-distended. Bowel sounds normoactive.  GU: Deferred.  Neuro: No focal deficits. Steady gait.  Psych: Mood and affect normal and appropriate for situation.  Extremities: No edema. MSK: No focal spinal tenderness to palpation.  Full range of motion in bilateral upper extremities Skin: Warm and dry.  LABORATORY DATA:  None for this visit.  DIAGNOSTIC IMAGING:  None for this visit.      ASSESSMENT AND PLAN:  Ms.. Savage is a pleasant 47 y.o. BRCA 2 + female with Stage IB left breast invasive ductal carcinoma, ER+/PR+/HER2-, diagnosed in 03/2017, treated with bilateral mastectomy, and upcoming adjuvant radiation therapy, and possibly anti-estrogen therapy with either Tamoxifen or Anastrozole.  She presents to the Survivorship Clinic for our initial meeting and routine follow-up post-completion of treatment for breast cancer.    1. Stage IB left breast cancer:  Madison Savage is continuing to recover from definitive treatment for breast cancer. She will follow-up with her medical oncologist, Dr. Burr Medico at the completion of her adjuvant radiation therapy with history and physical exam per surveillance protocol.  SToday, a comprehensive survivorship care plan and treatment summary was reviewed with the patient today detailing her breast cancer diagnosis, treatment course, potential late/long-term effects of treatment, appropriate follow-up care with recommendations for the future, and patient education resources.  A copy of this summary, along with a letter will be sent to the patient's primary care provider via mail/fax/In Basket message after today's visit.    2.  BRCA2+: She was given information in her care plan  regarding this.  She has upcoming appointment with Dr. Denman George to discuss TAH/BSO.    3. Bone health:  Given Ms. Schuchart's history of breast cancer and her potential treatment regimen including anti-estrogen therapy with Anastrozole, she is at risk for bone demineralization.  She was given education on specific activities to promote bone health.  4. Cancer screening:  Due to Ms. Niziolek's history and her age, she should receive screening for skin cancers, colon cancer (at age 11 or later), and gynecologic cancers.  The information and recommendations are listed on the patient's comprehensive care plan/treatment summary and were reviewed in detail with the patient.    5. Health maintenance and wellness promotion: Ms. Ofallon was encouraged to consume 5-7 servings of fruits and vegetables per day. We reviewed the "Nutrition Rainbow" handout, as well as the handout "Take Control of Your Health and Reduce Your Cancer Risk" from the [place].  She was also encouraged to continue to engage in moderate to vigorous exercise for 30 minutes per day most days of the week. She was instructed to limit her alcohol consumption and continue to abstain from tobacco use.     6. Support services/counseling: It is not uncommon for this period of the patient's cancer care trajectory to be one of many emotions and stressors.  We discussed an opportunity for her to participate in the next session of [facility] ("Finding Your New Normal") support group series designed for patients after they have completed treatment.   Ms. Vandoren was encouraged to take advantage of our many other support services programs, support groups, and/or counseling in coping with her new life as a cancer survivor after completing anti-cancer treatment.  After discussion with Lanelle Bal, I talked to our social worker, Johnnye Lana.  Vernie Shanks will contact [place] to help find her a therapist to work with.    Dispo:   -  F/u with Dr. Burr Medico after completion  of radiation therapy  -She is welcome to return back to the Survivorship Clinic at any time; no additional follow-up needed at this time.  -Consider referral back to survivorship as a long-term survivor for continued surveillance  A total of (30) minutes of face-to-face time was spent with this patient with greater than 50% of that time in counseling and care-coordination.   Gardenia Phlegm, [place] [phone]   Note: PRIMARY CARE PROVIDER Dorena Cookey, [place] [phone]

## 2017-12-19 ENCOUNTER — Encounter: Payer: Self-pay | Admitting: *Deleted

## 2017-12-19 ENCOUNTER — Telehealth: Payer: Self-pay | Admitting: Adult Health

## 2017-12-19 NOTE — Progress Notes (Signed)
[place] Work  Holiday representative received referral from APP for emotional support and counseling resources.  CSW contacted patient at home to offer support and assess for psychosocial needs.  CSW left patient a message offering support and information on counseling options.  CSW encouraged patient to call to further discuss resources and information.    Johnnye Lana, MSW, LCSW, OSW-C Clinical Social Worker [facility] [phone]

## 2017-12-19 NOTE — Telephone Encounter (Signed)
Per 4/15 no los 

## 2017-12-20 ENCOUNTER — Ambulatory Visit
Admission: RE | Admit: 2017-12-20 | Discharge: 2017-12-20 | Disposition: A | Discharge status: Home / Self Care | Payer: BLUE CROSS/BLUE SHIELD | Source: Ambulatory Visit | Attending: Radiation Oncology | Admitting: Radiation Oncology

## 2017-12-20 ENCOUNTER — Telehealth: Payer: Self-pay | Admitting: General Practice

## 2017-12-20 ENCOUNTER — Other Ambulatory Visit: Payer: Self-pay

## 2017-12-20 VITALS — BP 99/60 | HR 81 | Temp 97.7°F | Resp 20 | Ht 64.0 in | Wt 122.4 lb

## 2017-12-20 DIAGNOSIS — F419 Anxiety disorder, unspecified: Secondary | ICD-10-CM | POA: Diagnosis not present

## 2017-12-20 DIAGNOSIS — Z800 Family history of malignant neoplasm of digestive organs: Secondary | ICD-10-CM | POA: Insufficient documentation

## 2017-12-20 DIAGNOSIS — Z803 Family history of malignant neoplasm of breast: Secondary | ICD-10-CM | POA: Diagnosis not present

## 2017-12-20 DIAGNOSIS — Z8041 Family history of malignant neoplasm of ovary: Secondary | ICD-10-CM | POA: Insufficient documentation

## 2017-12-20 DIAGNOSIS — Z170 Estrogen receptor positive status [ER+]: Secondary | ICD-10-CM | POA: Diagnosis not present

## 2017-12-20 DIAGNOSIS — Z801 Family history of malignant neoplasm of trachea, bronchus and lung: Secondary | ICD-10-CM | POA: Insufficient documentation

## 2017-12-20 DIAGNOSIS — C50412 Malignant neoplasm of upper-outer quadrant of left female breast: Secondary | ICD-10-CM | POA: Insufficient documentation

## 2017-12-20 DIAGNOSIS — D693 Immune thrombocytopenic purpura: Secondary | ICD-10-CM | POA: Insufficient documentation

## 2017-12-20 DIAGNOSIS — Z87891 Personal history of nicotine dependence: Secondary | ICD-10-CM | POA: Insufficient documentation

## 2017-12-20 DIAGNOSIS — E7212 Methylenetetrahydrofolate reductase deficiency: Secondary | ICD-10-CM | POA: Diagnosis not present

## 2017-12-20 DIAGNOSIS — C773 Secondary and unspecified malignant neoplasm of axilla and upper limb lymph nodes: Principal | ICD-10-CM

## 2017-12-20 DIAGNOSIS — Z806 Family history of leukemia: Secondary | ICD-10-CM | POA: Insufficient documentation

## 2017-12-20 DIAGNOSIS — Z9013 Acquired absence of bilateral breasts and nipples: Secondary | ICD-10-CM | POA: Insufficient documentation

## 2017-12-20 DIAGNOSIS — C50912 Malignant neoplasm of unspecified site of left female breast: Secondary | ICD-10-CM

## 2017-12-20 NOTE — Addendum Note (Signed)
Encounter addended by: Kyung Rudd, MD on: 12/20/2017 12:46 PM  Actions taken: Problem List modified, Visit diagnoses modified

## 2017-12-20 NOTE — Telephone Encounter (Signed)
[place] CSW Progress Note  Phone call to patient to discuss need for counseling referral/resources. Left VM and requested call back.  Edwyna Shell, LCSW Clinical Social Worker Phone:  [phone]

## 2017-12-20 NOTE — Progress Notes (Signed)
Radiation Oncology         (336) [phone] ________________________________  Name: Madison Savage MRN: [id number]  Date: 12/20/2017  DOB: 06-21-1971  PF:[id number], Jory Ee, MD  [name], MD     REFERRING PHYSICIAN: [name], MD   DIAGNOSIS: The encounter diagnosis was Breast cancer metastasized to axillary lymph node, left ([place]).   HISTORY OF PRESENT ILLNESS: Madison Savage is a 47 y.o. female who was originally seen in the multidisciplinary breast conference in the summer of 2018 for a new diagnosis of a left  breast cancer. The patient noted a palpable mass in the upper outer quadrant of the left breast. She underwent diagnostic mammogram and the left breast identified a 3 mm lesion at 3:00 in the right breast. A 2.1 x 1 x .7 cm mass in the left breast as well as 6 abnormal axillary nodes were also seen. 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 She did test positive for BRCA2 mutation and is planning to proceed with evaluation with Dr. Denman George. On 04/27/18, she proceeded with bilateral skin sparing mastectomies and left axillary lymph node dissection with bilateral tissue expander placement. 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 DCIS was 1 mm to the posterior margin and invasive disease was focally present at the anterior margin. 8 of the 21 sampled nodes were positive for disease. She went on to proceed with chemotherapy between 06/09/17 and 11/16/17. She also underwent breast capsulectomy with implant exchange and bilateral breast implant placement and PAC removal on 12/06/17. She comes today to discuss adjuvant therapy with radiation to the chest wall and regional nodes.   PREVIOUS RADIATION THERAPY: No   PAST MEDICAL  HISTORY:  Past Medical History:  Diagnosis Date  . Anemia   . Anxiety   . Cancer ([place]) 03/2017   left breast cancer  . Chronic ITP (idiopathic thrombocytopenia) (HCC)   . Family history of breast cancer   . Family history of ovarian cancer   . Headache    last migraine...9/24  . Heart murmur    at birth.  none now  . MTHFR mutation ([place])        PAST SURGICAL HISTORY: Past Surgical History:  Procedure Laterality Date  . AXILLARY LYMPH NODE DISSECTION Left 04/27/2017   Procedure: AXILLARY LYMPH NODE DISSECTION;  Surgeon: [name], MD;  Location: [facility];  Service: General;  Laterality: Left;  . BILATERAL TOTAL MASTECTOMY WITH AXILLARY LYMPH NODE DISSECTION Bilateral 04/27/2017   Procedure: SKIN SPARING MASTECTOMY;  Surgeon: [name], MD;  Location: [facility];  Service: General;  Laterality: Bilateral;  . BREAST CAPSULECTOMY WITH IMPLANT EXCHANGE Bilateral 12/06/2017   Procedure: BREAST CAPSULECTOMY WITH IMPLANT EXCHANGE;  Surgeon: Wallace Going, DO;  Location: [facility] [place];  Service: Plastics;  Laterality: Bilateral;  . BREAST RECONSTRUCTION WITH PLACEMENT OF TISSUE EXPANDER AND FLEX HD (ACELLULAR HYDRATED DERMIS) Bilateral 04/27/2017   Procedure: BILATERAL BREAST RECONSTRUCTION WITH PLACEMENT OF TISSUE EXPANDER AND FLEX HD (ACELLULAR HYDRATED DERMIS);  Surgeon: Wallace Going, DO;  Location: [facility];  Service: Plastics;  Laterality: Bilateral;  . BREAST SURGERY    . MASTECTOMY    . PLACEMENT OF BREAST IMPLANTS  Bilateral 12/06/2017   Procedure: PLACEMENT OF BREAST IMPLANTS;  Surgeon: Wallace Going, DO;  Location: [facility];  Service: Plastics;  Laterality: Bilateral;  . PORT-A-CATH REMOVAL Right 12/06/2017   Procedure: REMOVAL PORT-A-CATH;  Surgeon: Wallace Going, DO;  Location: [facility];  Service: Plastics;  Laterality: Right;  . PORTACATH PLACEMENT Right  06/01/2017   Procedure: INSERTION PORT-A-CATH RIGHT SUBCLAVIAN;  Surgeon: [name], MD;  Location: [facility];  Service: General;  Laterality: Right;  . WISDOM TOOTH EXTRACTION       FAMILY HISTORY:  Family History  Problem Relation Age of Onset  . Breast cancer Paternal Aunt 74  . Ovarian cancer Paternal Aunt 64  . Cancer Paternal Grandmother        breast cancer   . Cancer Maternal Aunt        lymphoma   . Cancer Paternal Grandfather        throat cancer   . Cancer Paternal Aunt        lung cancer  . Cirrhosis Father   . AAA (abdominal aortic aneurysm) Maternal Grandfather   . Leukemia Paternal Aunt      SOCIAL HISTORY:  reports that she quit smoking about 9 months ago. Her smoking use included cigarettes. She has a 10.00 pack-year smoking history. She has never used smokeless tobacco. She reports that she does not drink alcohol or use drugs. The patient is married and lives in [place]. 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 She's originally from [place].   ALLERGIES: Clindamycin; Penicillins; and Codeine   MEDICATIONS:  Current Outpatient Medications  Medication Sig Dispense Refill  . ALPRAZolam (XANAX) 0.25 MG tablet Take 1 tablet (0.25 mg total) by mouth at bedtime as needed for anxiety. (Patient not taking: Reported on 12/18/2017) 20 tablet 0  . b complex vitamins capsule Take 1 capsule by mouth daily.    . Multiple Vitamins-Calcium (ONE-A-DAY WOMENS PO) Take 1 tablet by mouth daily.    Marland Kitchen OVER THE COUNTER MEDICATION Take 1 capsule by mouth daily. Biotin/MSM     No current facility-administered medications for this encounter.    Facility-Administered Medications Ordered in Other Encounters  Medication Dose Route Frequency Provider Last Rate Last Dose  . sodium chloride flush (NS) 0.9 % injection 10 mL  10 mL Intravenous PRN [name], MD   10 mL at 07/21/17 1323     REVIEW OF SYSTEMS: On review of systems, the patient reports that she is doing well overall since  completion of chemotherapy and since her last surgery. She denies any chest pain, shortness of breath, cough, fevers, chills, night sweats, unintended weight changes. She denies any bowel or bladder disturbances, and denies abdominal pain, nausea or vomiting. She denies any new musculoskeletal or joint aches or pains, new skin lesions or concerns. A complete review of systems is obtained and is otherwise negative.   PHYSICAL EXAM:  Wt Readings from Last 3 Encounters:  12/18/17 123 lb 6.4 oz (56 kg)  12/06/17 124 lb (56.2 kg)  11/16/17 128 lb 9.6 oz (58.3 kg)   Temp Readings from Last 3 Encounters:  12/18/17 98.7 F (37.1 C) (Oral)  12/06/17 97.7 F (36.5 C)  11/16/17 98.4 F (36.9 C) (Oral)   BP Readings from Last 3 Encounters:  12/18/17 111/65  12/06/17 117/74  11/16/17 119/69   Pulse Readings from Last 3 Encounters:  12/18/17 81  12/06/17 63  11/16/17 (!) 105    In general this is  a well appearing caucasian female  in no acute distress. She is alert and oriented x4 and appropriate throughout the examination. HEENT reveals that the patient is normocephalic, atraumatic. EOMs are intact. Skin is intact without any evidence of gross lesions. Cardiopulmonary assessment is negative for acute distress and she exhibits normal effort. Bilateral mastectomy scars and in situ implants. No erythema or separation is noted of either incision site.  ECOG = 0  0 - Asymptomatic (Fully active, able to carry on all predisease activities without restriction)  1 - Symptomatic but completely ambulatory (Restricted in physically strenuous activity but ambulatory and able to carry out work of a light or sedentary nature. For example, light housework, office work)  2 - Symptomatic, <50% in bed during the day (Ambulatory and capable of all self care but unable to carry out any work activities. Up and about more than 50% of waking hours)  3 - Symptomatic, >50% in bed, but not bedbound (Capable of only  limited self-care, confined to bed or chair 50% or more of waking hours)  4 - Bedbound (Completely disabled. Cannot carry on any self-care. Totally confined to bed or chair)  5 - Death   Eustace Pen MM, Creech RH, Tormey DC, et al. [id number]). "Toxicity and response criteria of the [facility]". [place] Oncol. 5 (6): 649-55    LABORATORY DATA:  Lab Results  Component Value Date   WBC 6.5 12/06/2017   HGB 13.0 12/06/2017   HCT 39.3 12/06/2017   MCV 92.7 12/06/2017   PLT 176 12/06/2017   Lab Results  Component Value Date   NA 139 11/16/2017   K 3.9 11/16/2017   CL 108 11/16/2017   CO2 24 11/16/2017   Lab Results  Component Value Date   ALT 14 11/16/2017   AST 13 11/16/2017   ALKPHOS 61 11/16/2017   BILITOT 0.2 11/16/2017      RADIOGRAPHY: No results found.     IMPRESSION/PLAN: 1. Stage IB, pT1cNaM0, grade 2, ER/PR positive, invasive ductal carcinoma of the left breast. Dr. Lisbeth Renshaw reviews her history and course to date. She is healing well and will be ready in the next 2-3 weeks to begin external radiotherapy to the chest wall and regional nodes followed by antiestrogen therapy. We discussed the risks, benefits, short, and long term effects of radiotherapy, and the patient is interested in proceeding. Dr. Lisbeth Renshaw discusses the delivery and logistics of radiotherapy and recommends a course of 6 1/2 weeks of treatment. Written consent is obtained and placed in the chart, a copy was provided to the patient. She will return next Tuesday for simulation.  2. Contraceptive counseling. She is aware of the rationale to avoid pregnancy during treatment.  3. BRCA2. She will meet next week with Dr. Denman George for risk reduction surgery given the ER component of her disease as well as ovarian cancer risk reduction.    The above documentation reflects my direct findings during this shared patient visit. Please see the separate note by Dr. Lisbeth Renshaw on this date for the remainder of  the patient's plan of care.    Carola Rhine, PAC

## 2017-12-26 ENCOUNTER — Ambulatory Visit
Admission: RE | Admit: 2017-12-26 | Discharge: 2017-12-26 | Disposition: A | Discharge status: Home / Self Care | Payer: BLUE CROSS/BLUE SHIELD | Source: Ambulatory Visit | Attending: Radiation Oncology | Admitting: Radiation Oncology

## 2017-12-26 ENCOUNTER — Encounter: Payer: Self-pay | Admitting: Gynecologic Oncology

## 2017-12-26 ENCOUNTER — Inpatient Hospital Stay (HOSPITAL_BASED_OUTPATIENT_CLINIC_OR_DEPARTMENT_OTHER): Payer: BLUE CROSS/BLUE SHIELD | Admitting: Gynecologic Oncology

## 2017-12-26 VITALS — BP 97/48 | HR 78 | Temp 97.6°F | Resp 20 | Ht 67.5 in | Wt 112.5 lb

## 2017-12-26 DIAGNOSIS — Z1501 Genetic susceptibility to malignant neoplasm of breast: Secondary | ICD-10-CM

## 2017-12-26 DIAGNOSIS — Z510 Encounter for antineoplastic radiation therapy: Secondary | ICD-10-CM | POA: Insufficient documentation

## 2017-12-26 DIAGNOSIS — Z1509 Genetic susceptibility to other malignant neoplasm: Secondary | ICD-10-CM | POA: Diagnosis not present

## 2017-12-26 DIAGNOSIS — Z170 Estrogen receptor positive status [ER+]: Secondary | ICD-10-CM | POA: Insufficient documentation

## 2017-12-26 DIAGNOSIS — C50412 Malignant neoplasm of upper-outer quadrant of left female breast: Secondary | ICD-10-CM | POA: Insufficient documentation

## 2017-12-26 DIAGNOSIS — C773 Secondary and unspecified malignant neoplasm of axilla and upper limb lymph nodes: Secondary | ICD-10-CM | POA: Diagnosis not present

## 2017-12-26 DIAGNOSIS — C50912 Malignant neoplasm of unspecified site of left female breast: Secondary | ICD-10-CM

## 2017-12-26 NOTE — Patient Instructions (Signed)
Please notify Dr Serita Grit office at [phone] when you have an estimate for when you would like to have surgery.  You will return to see Dr Denman George in September, 2019 as a preop appointment to discuss the procedure further.

## 2017-12-26 NOTE — Progress Notes (Signed)
Consult Note: Gyn-Onc  Consult was requested by Dr. Burr Medico for the evaluation of Madison Savage 47 y.o. female  CC:  Chief Complaint  Patient presents with  . BRCA2 positive    Assessment/Plan:  Madison Savage  is a 47 y.o.  year old with BRCA 2 deleterious germline mutation, and a personal history of HR positive breast cancer, s/p chemotherapy and surgery, radiation pending.  We discussed the nature of BRCA mutations and the inherent increased risk for ovarian and fallopian tube cancer. I discussed the poor quality of screening tests for ovarian cancer and therefore the recommendation for best practice is for BSO (+/- hysterectomy) after age 99 and end of childbearing. The patient is interested in definitive surgery in the fall after she has recovered from breast cancer therapy. Her scans from August, 2018 showed normal appearing ovaries, though she has a fibroid uterus. She is interested in hysterectomy. I think this is feasible based on exam, though I stipulated that hysterectomy is assocaited with increased risk and recovery compared to BSO alone.  I discussed risks associated with early menopause (early death from CV related disease, osteoporosis, menopausal symptoms). I discussed that given her early ovarian failure, these risks may not be substantially elevated from her baseline risk and she may not notice significant side effects. She realizes that BSO is associated with permanent infertility.   She will return to see me in September for a preop exam and contact our office when she has determined optimal surgery timing.    HPI: Ms Madison Savage is a 47 year old P3 who is seen in consultation at the request of Dr Burr Medico for a deleterious mutation in BRCA 2.  The patient has a strong paternal history of breast and ovarian malignancy. She has a paternal cousin who is a BRCA 1 deleterious mutation carrier. She was diagnosed with berast cacner in August, 2018 (ER/PR positive) and  treated with chemotherapy and surgery with a plan for radiation to start in April, 2019. She was tested for germline mutations and was noted to have a deleterious mutation in BRCA 2.  The patient is otherwise very healthy. She had 3 vaginal deliveries. 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 She had some menopausal symptoms and oligomenorrhea prior to her breast cancer treatment. She has never had abdominal surgeries.    Current Meds:  Outpatient Encounter Medications as of 12/26/2017  Medication Sig  . ALPRAZolam (XANAX) 0.25 MG tablet Take 1 tablet (0.25 mg total) by mouth at bedtime as needed for anxiety.  Marland Kitchen b complex vitamins capsule Take 1 capsule by mouth daily.  Marland Kitchen MILK THISTLE EXTRACT PO Take by mouth.  . Multiple Vitamins-Calcium (ONE-A-DAY WOMENS PO) Take 1 tablet by mouth daily.  Marland Kitchen OVER THE COUNTER MEDICATION Take 1 capsule by mouth daily. Biotin/MSM   Facility-Administered Encounter Medications as of 12/26/2017  Medication  . sodium chloride flush (NS) 0.9 % injection 10 mL    Allergy:  Allergies  Allergen Reactions  . Clindamycin Anaphylaxis and Other (See Comments)    REACTION: rash, sob  . Penicillins Anaphylaxis and Other (See Comments)    REACTION: rash  . Codeine Other (See Comments)    REACTION: sleepless,itches    Social Hx:   Social History   Socioeconomic History  . Marital status: Married    Spouse name: Not on file  . Number of children: 3  .  Years of education: Not on file  . Highest education level: Not on file  Occupational History    Employer: UNEMPLOYED  Social Needs  . Financial resource strain: Not on file  . Food insecurity:    Worry: Not on file    Inability: Not on file  . Transportation needs:    Medical: Not on file    Non-medical: Not on file  Tobacco Use  . Smoking status: Former Smoker    Packs/day: 0.50    Years: 20.00    Pack  years: 10.00    Types: Cigarettes    Last attempt to quit: 03/05/2017    Years since quitting: 0.8  . Smokeless tobacco: Never Used  Substance and Sexual Activity  . Alcohol use: No  . Drug use: No  . Sexual activity: Yes    Partners: Male    Birth control/protection: None  Lifestyle  . Physical activity:    Days per week: Not on file    Minutes per session: Not on file  . Stress: Not on file  Relationships  . Social connections:    Talks on phone: Not on file    Gets together: Not on file    Attends religious service: Not on file    Active member of club or organization: Not on file    Attends meetings of clubs or organizations: Not on file    Relationship status: Not on file  . Intimate partner violence:    Fear of current or ex partner: Not on file    Emotionally abused: Not on file    Physically abused: Not on file    Forced sexual activity: Not on file  Other Topics Concern  . Not on file  Social History Narrative   Stay at home mom   3 children ages 36, 51, 3    Past Surgical Hx:  Past Surgical History:  Procedure Laterality Date  . AXILLARY LYMPH NODE DISSECTION Left 04/27/2017   Procedure: AXILLARY LYMPH NODE DISSECTION;  Surgeon: [name], MD;  Location: [facility];  Service: General;  Laterality: Left;  . BILATERAL TOTAL MASTECTOMY WITH AXILLARY LYMPH NODE DISSECTION Bilateral 04/27/2017   Procedure: SKIN SPARING MASTECTOMY;  Surgeon: [name], MD;  Location: [facility];  Service: General;  Laterality: Bilateral;  . BREAST CAPSULECTOMY WITH IMPLANT EXCHANGE Bilateral 12/06/2017   Procedure: BREAST CAPSULECTOMY WITH IMPLANT EXCHANGE;  Surgeon: Wallace Going, DO;  Location: [facility];  Service: Plastics;  Laterality: Bilateral;  . BREAST RECONSTRUCTION WITH PLACEMENT OF TISSUE EXPANDER AND FLEX HD (ACELLULAR HYDRATED DERMIS) Bilateral 04/27/2017   Procedure: BILATERAL BREAST RECONSTRUCTION WITH PLACEMENT OF  TISSUE EXPANDER AND FLEX HD (ACELLULAR HYDRATED DERMIS);  Surgeon: Wallace Going, DO;  Location: [facility];  Service: Plastics;  Laterality: Bilateral;  . BREAST SURGERY    . MASTECTOMY    . PLACEMENT OF BREAST IMPLANTS Bilateral 12/06/2017   Procedure: PLACEMENT OF BREAST IMPLANTS;  Surgeon: Wallace Going, DO;  Location: [facility];  Service: Plastics;  Laterality: Bilateral;  . PORT-A-CATH REMOVAL Right 12/06/2017   Procedure: REMOVAL PORT-A-CATH;  Surgeon: Wallace Going, DO;  Location: [facility];  Service: Plastics;  Laterality: Right;  . PORTACATH PLACEMENT Right 06/01/2017   Procedure: INSERTION PORT-A-CATH RIGHT SUBCLAVIAN;  Surgeon: [name], MD;  Location: [facility];  Service: General;  Laterality: Right;  . WISDOM TOOTH EXTRACTION      Past Medical Hx:  Past  Medical History:  Diagnosis Date  . Anemia   . Anxiety   . Cancer ([place]) 03/2017   left breast cancer  . Chronic ITP (idiopathic thrombocytopenia) (HCC)   . Family history of breast cancer   . Family history of ovarian cancer   . Headache    last migraine...9/24  . Heart murmur    at birth.  none now  . MTHFR mutation [facility])     Past Gynecological History:  No hx of abnormal paps, SVD x 3, known fibroid uterus. No LMP recorded. (Menstrual status: Chemotherapy).  Family Hx:  Family History  Problem Relation Age of Onset  . Breast cancer Paternal Aunt 92  . Ovarian cancer Paternal Aunt 71  . Cancer Paternal Grandmother        breast cancer   . Cancer Maternal Aunt        lymphoma   . Cancer Paternal Grandfather        throat cancer   . Cancer Paternal Aunt        lung cancer  . Cirrhosis Father   . AAA (abdominal aortic aneurysm) Maternal Grandfather   . Leukemia Paternal Aunt     Review of Systems:  Constitutional  Feels well,    ENT Normal appearing ears and nares bilaterally Skin/Breast  No rash, sores, jaundice, itching,  dryness Cardiovascular  No chest pain, shortness of breath, or edema  Pulmonary  No cough or wheeze.  Gastro Intestinal  No nausea, vomitting, or diarrhoea. No bright red blood per rectum, no abdominal pain, change in bowel movement, or constipation.  Genito Urinary  No frequency, urgency, dysuria, no bleeding Musculo Skeletal  No myalgia, arthralgia, joint swelling or pain  Neurologic  No weakness, numbness, change in gait,  Psychology  No depression, anxiety, insomnia.   Vitals:  Blood pressure (!) 97/48, pulse 78, temperature 97.6 F (36.4 C), temperature source Oral, resp. rate 20, height 5' 7.5" (1.715 m), weight 112 lb 8 oz (51 kg), SpO2 99 %.  Physical Exam: WD in NAD Neck  Supple NROM, without any enlargements.  Lymph Node Survey No cervical supraclavicular or inguinal adenopathy Cardiovascular  Pulse normal rate, regularity and rhythm. S1 and S2 normal.  Lungs  Clear to auscultation bilateraly, without wheezes/crackles/rhonchi. Good air movement.  Skin  No rash/lesions/breakdown  Psychiatry  Alert and oriented to person, place, and time  Abdomen  Normoactive bowel sounds, abdomen soft, non-tender and thin without evidence of hernia.  Back No CVA tenderness Genito Urinary  Vulva/vagina: Normal external female genitalia.  No lesions. No discharge or bleeding.  Bladder/urethra:  No lesions or masses, well supported bladder  Vagina: normal  Cervix: Normal appearing, no lesions.  Uterus: approximately 10cm, mobile, no parametrial involvement or nodularity.  Adnexa: no discretely palpable masses. Rectal  deferred Extremities  No bilateral cyanosis, clubbing or edema.   Thereasa Solo, MD  12/26/2017, 7:27 PM

## 2017-12-27 ENCOUNTER — Telehealth: Payer: Self-pay | Admitting: Hematology

## 2017-12-27 NOTE — Telephone Encounter (Signed)
Appointment scheduled and message left for patient with date/time per 4/23 sch msg

## 2017-12-28 ENCOUNTER — Ambulatory Visit: Payer: BLUE CROSS/BLUE SHIELD | Attending: General Surgery | Admitting: Physical Therapy

## 2017-12-28 ENCOUNTER — Encounter: Payer: Self-pay | Admitting: Physical Therapy

## 2017-12-28 DIAGNOSIS — M25512 Pain in left shoulder: Secondary | ICD-10-CM | POA: Insufficient documentation

## 2017-12-28 DIAGNOSIS — M25612 Stiffness of left shoulder, not elsewhere classified: Secondary | ICD-10-CM | POA: Diagnosis not present

## 2017-12-28 NOTE — Progress Notes (Signed)
  Radiation Oncology         (336) [phone] ________________________________  Name: Madison Savage MRN: [id number]  Date: 12/26/2017  DOB: 19-Sep-1970  Diagnosis DIAGNOSIS:     ICD-10-CM   1. 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 ([place]) C50.412      SIMULATION AND TREATMENT PLANNING NOTE  The patient presented for simulation prior to beginning her course of radiation treatment for her diagnosis of left-sided breast cancer. The patient was placed in a supine position on a breast board. A customized vac-lock bag was also constructed and this complex treatment device will be used on a daily basis during her treatment. In this fashion, a CT scan was obtained through the chest area and an isocenter was placed near the chest wall at the upper aspect of the right chest. A breath-hold technique has also been evaluated to determine if this significantly improves the spatial relationship between the target region and the heart. Based on this analysis, a breath-hold technique has been ordered for the patient's treatment.  The patient will be planned to receive a course of radiation initially to a dose of 50.4 gray. This will consist of a 4 field technique targeting the left chest wall as well as the supraclavicular region. Therefore 2 customized medial and lateral tangent fields have been created targeting the chest wall, and also 2 additional customized fields have been designed to treat the supraclavicular region both with a left supraclavicular field and a left posterior axillary boost field. A forward planning/reduced field technique will also be evaluated to determine if this significantly improves the dose homogeneity of the overall plan. Therefore, additional customized blocks/fields may be necessary.  This initial treatment will be accomplished at 1.8 gray per fraction.   The initial plan will consist of a 3-D conformal technique. The target volume/scar, heart and lungs have  been contoured and dose volume histograms of each of these structures will be evaluated as part of the 3-D conformal treatment planning process.   It is anticipated that the patient will then receive a 10 gray boost to the surgical scar. This will be accomplished at 2 gray per fraction. The final anticipated total dose therefore will correspond to 60.4 gray.   Special treatment procedure was performed today due to the extra time and effort required by myself to plan and prepare this patient for deep inspiration breath hold technique.  I have determined cardiac sparing to be of benefit to this patient to prevent long term cardiac damage due to radiation of the heart.  Bellows were placed on the patient's abdomen. To facilitate cardiac sparing, the patient was coached by the radiation therapists on breath hold techniques and breathing practice was performed. Practice waveforms were obtained. The patient was then scanned while maintaining breath hold in the treatment position.  This image was then transferred over to the imaging specialist. The imaging specialist then created a fusion of the free breathing and breath hold scans using the chest wall as the stable structure. I personally reviewed the fusion in axial, coronal and sagittal image planes.  Excellent cardiac sparing was obtained.  I felt the patient is an appropriate candidate for breath hold and the patient will be treated as such.  The image fusion was then reviewed with the patient to reinforce the necessity of reproducible breath hold.     _______________________________   Jodelle Gross, MD, PhD

## 2017-12-28 NOTE — Progress Notes (Signed)
  Radiation Oncology         [phone]) [phone] ________________________________  Name: Madison Savage MRN: [id number]  Date: 12/26/2017  DOB: 12/05/1970  Optical Surface Tracking Plan:  Since intensity modulated radiotherapy (IMRT) and 3D conformal radiation treatment methods are predicated on accurate and precise positioning for treatment, intrafraction motion monitoring is medically necessary to ensure accurate and safe treatment delivery.  The ability to quantify intrafraction motion without excessive ionizing radiation dose can only be performed with optical surface tracking. Accordingly, surface imaging offers the opportunity to obtain 3D measurements of patient position throughout IMRT and 3D treatments without excessive radiation exposure.  I am ordering optical surface tracking for this patient's upcoming course of radiotherapy. ________________________________  Kyung Rudd, MD 12/28/2017 9:06 AM    Reference:   Particia Jasper, et al. Surface imaging-based analysis of intrafraction motion for breast radiotherapy patients.Journal of [place], n. 6, nov. 2014. ISSN [id number].   Available at: <http://www.jacmp.org/index.php/jacmp/article/view/4957>.

## 2017-12-28 NOTE — Therapy (Signed)
[place], [place], [address] Phone: [phone]   Fax:  [phone]  Physical Therapy Treatment  Patient Details  Name: Madison Savage MRN: [id number] Date of Birth: Feb 24, 1971 Referring Provider: Dr. Burr Medico   Encounter Date: 12/28/2017  PT End of Session - 12/28/17 1109    Visit Number  5    Number of Visits  9    Date for PT Re-Evaluation  01/06/18    PT Start Time  1019    PT Stop Time  1102    PT Time Calculation (min)  43 min    Activity Tolerance  Patient tolerated treatment well    Behavior During Therapy  [facility] for tasks assessed/performed       Past Medical History:  Diagnosis Date  . Anemia   . Anxiety   . Cancer ([place]) 03/2017   left breast cancer  . Chronic ITP (idiopathic thrombocytopenia) (HCC)   . Family history of breast cancer   . Family history of ovarian cancer   . Headache    last migraine...9/24  . Heart murmur    at birth.  none now  . MTHFR mutation [facility])     Past Surgical History:  Procedure Laterality Date  . AXILLARY LYMPH NODE DISSECTION Left 04/27/2017   Procedure: AXILLARY LYMPH NODE DISSECTION;  Surgeon: [name], MD;  Location: [place];  Service: General;  Laterality: Left;  . BILATERAL TOTAL MASTECTOMY WITH AXILLARY LYMPH NODE DISSECTION Bilateral 04/27/2017   Procedure: SKIN SPARING MASTECTOMY;  Surgeon: [name], MD;  Location: [place];  Service: General;  Laterality: Bilateral;  . BREAST CAPSULECTOMY WITH IMPLANT EXCHANGE Bilateral 12/06/2017   Procedure: BREAST CAPSULECTOMY WITH IMPLANT EXCHANGE;  Surgeon: Wallace Going, DO;  Location: [place];  Service: Plastics;  Laterality: Bilateral;  . BREAST RECONSTRUCTION WITH PLACEMENT OF TISSUE EXPANDER AND FLEX HD (ACELLULAR HYDRATED DERMIS) Bilateral 04/27/2017   Procedure: BILATERAL BREAST RECONSTRUCTION WITH PLACEMENT OF TISSUE EXPANDER AND FLEX  HD (ACELLULAR HYDRATED DERMIS);  Surgeon: Wallace Going, DO;  Location: [place];  Service: Plastics;  Laterality: Bilateral;  . BREAST SURGERY    . MASTECTOMY    . PLACEMENT OF BREAST IMPLANTS Bilateral 12/06/2017   Procedure: PLACEMENT OF BREAST IMPLANTS;  Surgeon: Wallace Going, DO;  Location: [place];  Service: Plastics;  Laterality: Bilateral;  . PORT-A-CATH REMOVAL Right 12/06/2017   Procedure: REMOVAL PORT-A-CATH;  Surgeon: Wallace Going, DO;  Location: [place];  Service: Plastics;  Laterality: Right;  . PORTACATH PLACEMENT Right 06/01/2017   Procedure: INSERTION PORT-A-CATH RIGHT SUBCLAVIAN;  Surgeon: [name], MD;  Location: [place];  Service: General;  Laterality: Right;  . WISDOM TOOTH EXTRACTION      There were no vitals filed for this visit.  Subjective Assessment - 12/28/17 1021    Subjective  I haven't been here since my implant surgery. I fell last Wednesday over a baby gate right on my implant and on my shoulder. It hurt really bad. It just feels tight now. I start radiation on Wednesday.     Pertinent History  Patient was diagnosed on 03/21/17 with left invasive lobular carcinoma breast cancer. It measures 2.1 cm and is located in the upper outer quadrant. She also has 6 abnormal axillary lymph nodes and a node was biopsied and found to be positive for carcinoma.   04/27/2017 .bilateral mastecomy with ALND ( 21 nodes  removed) with immedicate expanders. She is planning to have chemo, then have implant surgery and hysterectomy and then have radiation     Patient Stated Goals  to get rid of shoulder pain     Currently in Pain?  No/denies    Pain Score  0-No pain                       [facility] Adult PT Treatment/Exercise - 12/28/17 0001      Manual Therapy   Passive ROM  to left shoulder in direction of flexion, abduction and ER, PROM greatly improved by end of session but ER remained tight                 Short Term Clinic Goals - 08/31/17 1249      CC Short Term Goal  #1   Title  pt will be knowledgable of lymphedema risk reduction practices     Status  Achieved      CC Short Term Goal  #2   Title  Pt will be independent in postural and shoulder stretching exercises    Status  Achieved       PT Long Term Goals - 11/01/17 1253      PT LONG TERM GOAL #1   Title  Pt will report that the pain and discomfort in left shoulder has decreased by 50%     Time  8    Period  Weeks    Status  New      PT LONG TERM GOAL #2   Title  Pt will be independent in upgraded HEP for scapular strength and ROM     Time  [facility] Goals - 03/29/17 1232      Patient will be able to verbalize understanding of pertinent lymphedema risk reduction practices relevant to her diagnosis specifically related to skin care.   Time  1    Period  Days    Status  Achieved      Patient will be able to return demonstrate and/or verbalize understanding of the post-op home exercise program related to regaining shoulder range of motion.   Time  1    Period  Days    Status  Achieved      Patient will be able to verbalize understanding of the importance of attending the postoperative After Breast Cancer Class for further lymphedema risk reduction education and therapeutic exercise.   Time  1    Period  Days    Status  Achieved       Long Term Clinic Goals - 08/31/17 1100      CC Long Term Goal  #1   Title  Pt will have 140 degrees of left shoulder abduction so that she can received radiation treatment     Baseline  05/25/2017: 60 degrees , 07/05/2017  140 degrees ,08/01/2017 155     Status  Achieved      CC Long Term Goal  #2   Title  Pt will ahve 150 degrees of right shoulder abduction so that she can perform household activities without difficulty     Baseline  05/25/2017: 95 degrees , 155 degrees on 07/05/2017, 167 on 08/01/2017 , 08/31/2017:   180 degrees    Status  Achieved      CC Long Term Goal  #3   Title  Pt will be  independent in strength ABC program with knowledge of how to safely increase her intensity of exercise    Status  Achieved      CC Long Term Goal  #4   Title  Pt will decrease Quick DASH score to < 20 indicating a function improvment of arms     Baseline  52.27 on 05/25/2017 , 4.55 on 08/31/2017    Status  Achieved      CC Long Term Goal  #5   Title   will have a report a reduction in cording left axilla by 75%    Baseline  cording is still present but decreased by 50%    Status  Partially Met      CC Long Term Goal  #6   Title  Pt will be able to return to plank and side crunch exercise that she wants to do without pain     Status  Achieved         Plan - 12/28/17 1109    Clinical Impression Statement  Patient had her implants placed since her last visit. She reports she fell over a baby gate on to her left shoulder last week and on to her implant. She reports increased tightness since this happened. She is still on restrictions for strengthening exercises so today focused on PROM to left shoulder to help decrease tightness. By end of session, pt had full PROM in left shoulder in direction of flexion and abduction but was still very limited in ER by tight pec.     Rehab Potential  Good    Clinical Impairments Affecting Rehab Potential  21 nodes removed in left axilla     PT Frequency  1x / week    PT Duration  8 weeks    PT Treatment/Interventions  Therapeutic exercise;Patient/family education;ADLs/Self Care Home Management;Taping;DME Instruction;Dry needling;Functional mobility training;Therapeutic activities;Manual lymph drainage;Manual techniques;Passive range of motion    PT Next Visit Plan  see if pt is still on precautions and proceed from there    Consulted and Agree with Plan of Care  Patient       Patient will benefit from skilled therapeutic intervention in order to improve the following  deficits and impairments:  Decreased range of motion, Pain, Postural dysfunction, Increased fascial restricitons, Impaired perceived functional ability  Visit Diagnosis: Stiffness of left shoulder, not elsewhere classified  Acute pain of left shoulder     Problem List Patient Active Problem List   Diagnosis Date Noted  . Port-A-Cath in place 08/18/2017  . Genetic testing 04/17/2017  . BRCA2 gene mutation positive 04/17/2017  . Family history of breast cancer   . Family history of ovarian cancer   . 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 ([place]) 03/29/2017  . Cough 03/10/2014  . Esophagitis 03/10/2014  . Chronic ITP (idiopathic thrombocytopenia) (HCC) 03/10/2014  . ALLERGIC REACTION, ACUTE 07/22/2008    Allyson Sabal [facility] 12/28/2017, 11:12 AM  [place], [place], [address] Phone: [phone]   Fax:  [phone]  Name: Madison Savage MRN: [id number] Date of Birth: 23-Dec-1970  Manus Gunning, PT 12/28/17 11:13 AM

## 2017-12-29 DIAGNOSIS — Z170 Estrogen receptor positive status [ER+]: Secondary | ICD-10-CM | POA: Diagnosis not present

## 2017-12-29 DIAGNOSIS — C50412 Malignant neoplasm of upper-outer quadrant of left female breast: Secondary | ICD-10-CM | POA: Diagnosis not present

## 2017-12-29 DIAGNOSIS — Z510 Encounter for antineoplastic radiation therapy: Secondary | ICD-10-CM | POA: Diagnosis not present

## 2018-01-02 ENCOUNTER — Ambulatory Visit
Admission: RE | Admit: 2018-01-02 | Discharge: 2018-01-02 | Disposition: A | Discharge status: Home / Self Care | Payer: BLUE CROSS/BLUE SHIELD | Source: Ambulatory Visit | Attending: Radiation Oncology | Admitting: Radiation Oncology

## 2018-01-02 DIAGNOSIS — C50412 Malignant neoplasm of upper-outer quadrant of left female breast: Secondary | ICD-10-CM | POA: Diagnosis not present

## 2018-01-02 DIAGNOSIS — Z170 Estrogen receptor positive status [ER+]: Secondary | ICD-10-CM | POA: Diagnosis not present

## 2018-01-02 DIAGNOSIS — Z510 Encounter for antineoplastic radiation therapy: Secondary | ICD-10-CM | POA: Diagnosis not present

## 2018-01-03 ENCOUNTER — Ambulatory Visit
Admission: RE | Admit: 2018-01-03 | Discharge: 2018-01-03 | Disposition: A | Discharge status: Home / Self Care | Payer: BLUE CROSS/BLUE SHIELD | Source: Ambulatory Visit | Attending: Radiation Oncology | Admitting: Radiation Oncology

## 2018-01-03 DIAGNOSIS — Z170 Estrogen receptor positive status [ER+]: Secondary | ICD-10-CM | POA: Diagnosis not present

## 2018-01-03 DIAGNOSIS — Z510 Encounter for antineoplastic radiation therapy: Secondary | ICD-10-CM | POA: Diagnosis not present

## 2018-01-03 DIAGNOSIS — C50412 Malignant neoplasm of upper-outer quadrant of left female breast: Secondary | ICD-10-CM | POA: Insufficient documentation

## 2018-01-04 ENCOUNTER — Ambulatory Visit
Admission: RE | Admit: 2018-01-04 | Discharge: 2018-01-04 | Disposition: A | Discharge status: Home / Self Care | Payer: BLUE CROSS/BLUE SHIELD | Source: Ambulatory Visit | Attending: Radiation Oncology | Admitting: Radiation Oncology

## 2018-01-04 DIAGNOSIS — Z510 Encounter for antineoplastic radiation therapy: Secondary | ICD-10-CM | POA: Diagnosis not present

## 2018-01-04 DIAGNOSIS — Z170 Estrogen receptor positive status [ER+]: Secondary | ICD-10-CM | POA: Diagnosis not present

## 2018-01-04 DIAGNOSIS — C50412 Malignant neoplasm of upper-outer quadrant of left female breast: Secondary | ICD-10-CM | POA: Diagnosis not present

## 2018-01-05 ENCOUNTER — Ambulatory Visit
Admission: RE | Admit: 2018-01-05 | Discharge: 2018-01-05 | Disposition: A | Discharge status: Home / Self Care | Payer: BLUE CROSS/BLUE SHIELD | Source: Ambulatory Visit | Attending: Radiation Oncology | Admitting: Radiation Oncology

## 2018-01-05 DIAGNOSIS — Z170 Estrogen receptor positive status [ER+]: Secondary | ICD-10-CM | POA: Diagnosis not present

## 2018-01-05 DIAGNOSIS — Z510 Encounter for antineoplastic radiation therapy: Secondary | ICD-10-CM | POA: Diagnosis not present

## 2018-01-05 DIAGNOSIS — C50412 Malignant neoplasm of upper-outer quadrant of left female breast: Secondary | ICD-10-CM | POA: Diagnosis not present

## 2018-01-05 MED ORDER — ALRA NON-METALLIC DEODORANT (RAD-ONC)
1.0000 "application " | Freq: Once | TOPICAL | Status: AC
  Administered 2018-01-05: 1 via TOPICAL

## 2018-01-05 MED ORDER — RADIAPLEXRX EX GEL
Freq: Once | CUTANEOUS | Status: AC
  Administered 2018-01-05: 16:00:00 via TOPICAL

## 2018-01-05 NOTE — Progress Notes (Signed)
Pt here for patient teaching.  Pt given Radiation and You booklet, Alra deodorant and Radiaplex gel.  Reviewed areas of pertinence such as fatigue, hair loss, skin changes, breast tenderness and breast swelling . Pt able to give teach back of to pat skin and use unscented/gentle soap,apply Radiaplex bid, avoid applying anything to skin within 4 hours of treatment and to use an electric razor if they must shave. Pt verbalizes understanding of information given and will contact nursing with any questions or concerns.     Cori Razor, RN

## 2018-01-08 ENCOUNTER — Ambulatory Visit
Admission: RE | Admit: 2018-01-08 | Discharge: 2018-01-08 | Disposition: A | Discharge status: Home / Self Care | Payer: BLUE CROSS/BLUE SHIELD | Source: Ambulatory Visit | Attending: Radiation Oncology | Admitting: Radiation Oncology

## 2018-01-08 DIAGNOSIS — C50412 Malignant neoplasm of upper-outer quadrant of left female breast: Secondary | ICD-10-CM | POA: Diagnosis not present

## 2018-01-08 DIAGNOSIS — Z510 Encounter for antineoplastic radiation therapy: Secondary | ICD-10-CM | POA: Diagnosis not present

## 2018-01-08 DIAGNOSIS — Z170 Estrogen receptor positive status [ER+]: Secondary | ICD-10-CM | POA: Diagnosis not present

## 2018-01-09 ENCOUNTER — Ambulatory Visit
Admission: RE | Admit: 2018-01-09 | Discharge: 2018-01-09 | Disposition: A | Discharge status: Home / Self Care | Payer: BLUE CROSS/BLUE SHIELD | Source: Ambulatory Visit | Attending: Radiation Oncology | Admitting: Radiation Oncology

## 2018-01-09 DIAGNOSIS — Z510 Encounter for antineoplastic radiation therapy: Secondary | ICD-10-CM | POA: Diagnosis not present

## 2018-01-09 DIAGNOSIS — C50412 Malignant neoplasm of upper-outer quadrant of left female breast: Secondary | ICD-10-CM | POA: Diagnosis not present

## 2018-01-09 DIAGNOSIS — Z170 Estrogen receptor positive status [ER+]: Secondary | ICD-10-CM | POA: Diagnosis not present

## 2018-01-10 ENCOUNTER — Ambulatory Visit
Admission: RE | Admit: 2018-01-10 | Discharge: 2018-01-10 | Disposition: A | Discharge status: Home / Self Care | Payer: BLUE CROSS/BLUE SHIELD | Source: Ambulatory Visit | Attending: Radiation Oncology | Admitting: Radiation Oncology

## 2018-01-10 DIAGNOSIS — C50412 Malignant neoplasm of upper-outer quadrant of left female breast: Secondary | ICD-10-CM | POA: Diagnosis not present

## 2018-01-10 DIAGNOSIS — Z170 Estrogen receptor positive status [ER+]: Secondary | ICD-10-CM | POA: Diagnosis not present

## 2018-01-10 DIAGNOSIS — Z510 Encounter for antineoplastic radiation therapy: Secondary | ICD-10-CM | POA: Diagnosis not present

## 2018-01-11 ENCOUNTER — Ambulatory Visit
Admission: RE | Admit: 2018-01-11 | Discharge: 2018-01-11 | Disposition: A | Discharge status: Home / Self Care | Payer: BLUE CROSS/BLUE SHIELD | Source: Ambulatory Visit | Attending: Radiation Oncology | Admitting: Radiation Oncology

## 2018-01-11 DIAGNOSIS — C50412 Malignant neoplasm of upper-outer quadrant of left female breast: Secondary | ICD-10-CM | POA: Diagnosis not present

## 2018-01-11 DIAGNOSIS — Z510 Encounter for antineoplastic radiation therapy: Secondary | ICD-10-CM | POA: Diagnosis not present

## 2018-01-11 DIAGNOSIS — Z170 Estrogen receptor positive status [ER+]: Secondary | ICD-10-CM | POA: Diagnosis not present

## 2018-01-12 ENCOUNTER — Ambulatory Visit
Admission: RE | Admit: 2018-01-12 | Discharge: 2018-01-12 | Disposition: A | Discharge status: Home / Self Care | Payer: BLUE CROSS/BLUE SHIELD | Source: Ambulatory Visit | Attending: Radiation Oncology | Admitting: Radiation Oncology

## 2018-01-12 DIAGNOSIS — C50412 Malignant neoplasm of upper-outer quadrant of left female breast: Secondary | ICD-10-CM | POA: Diagnosis not present

## 2018-01-12 DIAGNOSIS — Z510 Encounter for antineoplastic radiation therapy: Secondary | ICD-10-CM | POA: Diagnosis not present

## 2018-01-12 DIAGNOSIS — Z170 Estrogen receptor positive status [ER+]: Secondary | ICD-10-CM | POA: Diagnosis not present

## 2018-01-15 ENCOUNTER — Ambulatory Visit
Admission: RE | Admit: 2018-01-15 | Discharge: 2018-01-15 | Disposition: A | Discharge status: Home / Self Care | Payer: BLUE CROSS/BLUE SHIELD | Source: Ambulatory Visit | Attending: Radiation Oncology | Admitting: Radiation Oncology

## 2018-01-15 DIAGNOSIS — Z170 Estrogen receptor positive status [ER+]: Secondary | ICD-10-CM | POA: Diagnosis not present

## 2018-01-15 DIAGNOSIS — Z510 Encounter for antineoplastic radiation therapy: Secondary | ICD-10-CM | POA: Diagnosis not present

## 2018-01-15 DIAGNOSIS — C50412 Malignant neoplasm of upper-outer quadrant of left female breast: Secondary | ICD-10-CM | POA: Diagnosis not present

## 2018-01-16 ENCOUNTER — Ambulatory Visit
Admission: RE | Admit: 2018-01-16 | Discharge: 2018-01-16 | Disposition: A | Discharge status: Home / Self Care | Payer: BLUE CROSS/BLUE SHIELD | Source: Ambulatory Visit | Attending: Radiation Oncology | Admitting: Radiation Oncology

## 2018-01-16 DIAGNOSIS — Z170 Estrogen receptor positive status [ER+]: Secondary | ICD-10-CM | POA: Diagnosis not present

## 2018-01-16 DIAGNOSIS — C50412 Malignant neoplasm of upper-outer quadrant of left female breast: Secondary | ICD-10-CM | POA: Diagnosis not present

## 2018-01-16 DIAGNOSIS — Z510 Encounter for antineoplastic radiation therapy: Secondary | ICD-10-CM | POA: Diagnosis not present

## 2018-01-17 ENCOUNTER — Ambulatory Visit
Admission: RE | Admit: 2018-01-17 | Discharge: 2018-01-17 | Disposition: A | Discharge status: Home / Self Care | Payer: BLUE CROSS/BLUE SHIELD | Source: Ambulatory Visit | Attending: Radiation Oncology | Admitting: Radiation Oncology

## 2018-01-17 DIAGNOSIS — Z170 Estrogen receptor positive status [ER+]: Secondary | ICD-10-CM | POA: Diagnosis not present

## 2018-01-17 DIAGNOSIS — Z510 Encounter for antineoplastic radiation therapy: Secondary | ICD-10-CM | POA: Diagnosis not present

## 2018-01-17 DIAGNOSIS — C50412 Malignant neoplasm of upper-outer quadrant of left female breast: Secondary | ICD-10-CM | POA: Diagnosis not present

## 2018-01-18 ENCOUNTER — Ambulatory Visit
Admission: RE | Admit: 2018-01-18 | Discharge: 2018-01-18 | Disposition: A | Discharge status: Home / Self Care | Payer: BLUE CROSS/BLUE SHIELD | Source: Ambulatory Visit | Attending: Radiation Oncology | Admitting: Radiation Oncology

## 2018-01-18 DIAGNOSIS — Z170 Estrogen receptor positive status [ER+]: Secondary | ICD-10-CM | POA: Diagnosis not present

## 2018-01-18 DIAGNOSIS — Z510 Encounter for antineoplastic radiation therapy: Secondary | ICD-10-CM | POA: Diagnosis not present

## 2018-01-18 DIAGNOSIS — C50412 Malignant neoplasm of upper-outer quadrant of left female breast: Secondary | ICD-10-CM | POA: Diagnosis not present

## 2018-01-19 ENCOUNTER — Ambulatory Visit
Admission: RE | Admit: 2018-01-19 | Discharge: 2018-01-19 | Disposition: A | Discharge status: Home / Self Care | Payer: BLUE CROSS/BLUE SHIELD | Source: Ambulatory Visit | Attending: Radiation Oncology | Admitting: Radiation Oncology

## 2018-01-19 DIAGNOSIS — C50412 Malignant neoplasm of upper-outer quadrant of left female breast: Secondary | ICD-10-CM | POA: Diagnosis not present

## 2018-01-19 DIAGNOSIS — Z170 Estrogen receptor positive status [ER+]: Secondary | ICD-10-CM | POA: Diagnosis not present

## 2018-01-19 DIAGNOSIS — Z510 Encounter for antineoplastic radiation therapy: Secondary | ICD-10-CM | POA: Diagnosis not present

## 2018-01-22 ENCOUNTER — Ambulatory Visit
Admission: RE | Admit: 2018-01-22 | Discharge: 2018-01-22 | Disposition: A | Discharge status: Home / Self Care | Payer: BLUE CROSS/BLUE SHIELD | Source: Ambulatory Visit | Attending: Radiation Oncology | Admitting: Radiation Oncology

## 2018-01-22 DIAGNOSIS — Z170 Estrogen receptor positive status [ER+]: Secondary | ICD-10-CM | POA: Diagnosis not present

## 2018-01-22 DIAGNOSIS — C50412 Malignant neoplasm of upper-outer quadrant of left female breast: Secondary | ICD-10-CM | POA: Diagnosis not present

## 2018-01-22 DIAGNOSIS — Z510 Encounter for antineoplastic radiation therapy: Secondary | ICD-10-CM | POA: Diagnosis not present

## 2018-01-23 ENCOUNTER — Encounter: Payer: Self-pay | Admitting: Radiation Oncology

## 2018-01-23 ENCOUNTER — Ambulatory Visit
Admission: RE | Admit: 2018-01-23 | Discharge: 2018-01-23 | Disposition: A | Discharge status: Home / Self Care | Payer: BLUE CROSS/BLUE SHIELD | Source: Ambulatory Visit | Attending: Radiation Oncology | Admitting: Radiation Oncology

## 2018-01-23 DIAGNOSIS — C50412 Malignant neoplasm of upper-outer quadrant of left female breast: Secondary | ICD-10-CM | POA: Diagnosis not present

## 2018-01-23 DIAGNOSIS — Z170 Estrogen receptor positive status [ER+]: Secondary | ICD-10-CM | POA: Diagnosis not present

## 2018-01-23 DIAGNOSIS — Z510 Encounter for antineoplastic radiation therapy: Secondary | ICD-10-CM | POA: Diagnosis not present

## 2018-01-23 NOTE — Progress Notes (Signed)
The pt was seen today following radiotherapy. She's have 15/33 fractions to the left chest wall with an in situ implant. She was seen by plastic surgery yesterday with concerns about her incision site. On exam there does not appear to be desquamation or dehiscence of her incision line from her mastectomy scar, but she has a large implant and tightening of the skin. After meeting with Dr. Lisbeth Renshaw and evaluating her together, given her positive nodes at the time of surgery, he really prefers the patient continue with treatment to prevent progressive disease/local recurrence. The patient and her husband are in agreement to proceed as well with the hopes we won't encounter dehiscence or long term changes in the ability to maintain her reconstruction as has occurred to date. She and her husband will keep [place] informed of questions or concerns that arise and I've left a message to discuss her case with plastic surgery as well.

## 2018-01-24 ENCOUNTER — Ambulatory Visit
Admission: RE | Admit: 2018-01-24 | Discharge: 2018-01-24 | Disposition: A | Discharge status: Home / Self Care | Payer: BLUE CROSS/BLUE SHIELD | Source: Ambulatory Visit | Attending: Radiation Oncology | Admitting: Radiation Oncology

## 2018-01-24 DIAGNOSIS — C50412 Malignant neoplasm of upper-outer quadrant of left female breast: Secondary | ICD-10-CM | POA: Diagnosis not present

## 2018-01-24 DIAGNOSIS — Z170 Estrogen receptor positive status [ER+]: Secondary | ICD-10-CM | POA: Diagnosis not present

## 2018-01-24 DIAGNOSIS — Z510 Encounter for antineoplastic radiation therapy: Secondary | ICD-10-CM | POA: Diagnosis not present

## 2018-01-25 ENCOUNTER — Ambulatory Visit
Admission: RE | Admit: 2018-01-25 | Discharge: 2018-01-25 | Disposition: A | Discharge status: Home / Self Care | Payer: BLUE CROSS/BLUE SHIELD | Source: Ambulatory Visit | Attending: Radiation Oncology | Admitting: Radiation Oncology

## 2018-01-25 DIAGNOSIS — Z170 Estrogen receptor positive status [ER+]: Secondary | ICD-10-CM | POA: Diagnosis not present

## 2018-01-25 DIAGNOSIS — Z510 Encounter for antineoplastic radiation therapy: Secondary | ICD-10-CM | POA: Diagnosis not present

## 2018-01-25 DIAGNOSIS — C50412 Malignant neoplasm of upper-outer quadrant of left female breast: Secondary | ICD-10-CM | POA: Diagnosis not present

## 2018-01-26 ENCOUNTER — Encounter: Payer: Self-pay | Admitting: Physical Therapy

## 2018-01-26 ENCOUNTER — Ambulatory Visit: Payer: BLUE CROSS/BLUE SHIELD | Attending: General Surgery | Admitting: Physical Therapy

## 2018-01-26 ENCOUNTER — Ambulatory Visit
Admission: RE | Admit: 2018-01-26 | Discharge: 2018-01-26 | Disposition: A | Discharge status: Home / Self Care | Payer: BLUE CROSS/BLUE SHIELD | Source: Ambulatory Visit | Attending: Radiation Oncology | Admitting: Radiation Oncology

## 2018-01-26 DIAGNOSIS — Z483 Aftercare following surgery for neoplasm: Secondary | ICD-10-CM | POA: Diagnosis not present

## 2018-01-26 DIAGNOSIS — R293 Abnormal posture: Secondary | ICD-10-CM

## 2018-01-26 DIAGNOSIS — M25512 Pain in left shoulder: Secondary | ICD-10-CM | POA: Diagnosis not present

## 2018-01-26 DIAGNOSIS — C50412 Malignant neoplasm of upper-outer quadrant of left female breast: Secondary | ICD-10-CM | POA: Diagnosis not present

## 2018-01-26 DIAGNOSIS — M25612 Stiffness of left shoulder, not elsewhere classified: Secondary | ICD-10-CM | POA: Diagnosis not present

## 2018-01-26 DIAGNOSIS — M25611 Stiffness of right shoulder, not elsewhere classified: Secondary | ICD-10-CM | POA: Diagnosis not present

## 2018-01-26 DIAGNOSIS — M25511 Pain in right shoulder: Secondary | ICD-10-CM | POA: Diagnosis not present

## 2018-01-26 DIAGNOSIS — Z510 Encounter for antineoplastic radiation therapy: Secondary | ICD-10-CM | POA: Diagnosis not present

## 2018-01-26 DIAGNOSIS — Z170 Estrogen receptor positive status [ER+]: Secondary | ICD-10-CM | POA: Diagnosis not present

## 2018-01-26 NOTE — Therapy (Signed)
[place], [place], [address] Phone: [phone]   Fax:  [phone]  Physical Therapy Treatment  Patient Details  Name: Madison Savage MRN: [id number] Date of Birth: Nov 02, 1970 Referring Provider: Dr. Burr Medico   Encounter Date: 01/26/2018  PT End of Session - 01/26/18 1202    Visit Number  6    Number of Visits  9    Date for PT Re-Evaluation  03/23/18    PT Start Time  0900    PT Stop Time  0933    PT Time Calculation (min)  33 min    Activity Tolerance  Patient tolerated treatment well    Behavior During Therapy  [facility] for tasks assessed/performed       Past Medical History:  Diagnosis Date  . Anemia   . Anxiety   . Cancer ([place]) 03/2017   left breast cancer  . Chronic ITP (idiopathic thrombocytopenia) (HCC)   . Family history of breast cancer   . Family history of ovarian cancer   . Headache    last migraine...9/24  . Heart murmur    at birth.  none now  . MTHFR mutation [facility])     Past Surgical History:  Procedure Laterality Date  . AXILLARY LYMPH NODE DISSECTION Left 04/27/2017   Procedure: AXILLARY LYMPH NODE DISSECTION;  Surgeon: [name], MD;  Location: [place];  Service: General;  Laterality: Left;  . BILATERAL TOTAL MASTECTOMY WITH AXILLARY LYMPH NODE DISSECTION Bilateral 04/27/2017   Procedure: SKIN SPARING MASTECTOMY;  Surgeon: [name], MD;  Location: [place];  Service: General;  Laterality: Bilateral;  . BREAST CAPSULECTOMY WITH IMPLANT EXCHANGE Bilateral 12/06/2017   Procedure: BREAST CAPSULECTOMY WITH IMPLANT EXCHANGE;  Surgeon: Wallace Going, DO;  Location: [place];  Service: Plastics;  Laterality: Bilateral;  . BREAST RECONSTRUCTION WITH PLACEMENT OF TISSUE EXPANDER AND FLEX HD (ACELLULAR HYDRATED DERMIS) Bilateral 04/27/2017   Procedure: BILATERAL BREAST RECONSTRUCTION WITH PLACEMENT OF TISSUE EXPANDER AND FLEX  HD (ACELLULAR HYDRATED DERMIS);  Surgeon: Wallace Going, DO;  Location: [place];  Service: Plastics;  Laterality: Bilateral;  . BREAST SURGERY    . MASTECTOMY    . PLACEMENT OF BREAST IMPLANTS Bilateral 12/06/2017   Procedure: PLACEMENT OF BREAST IMPLANTS;  Surgeon: Wallace Going, DO;  Location: [place];  Service: Plastics;  Laterality: Bilateral;  . PORT-A-CATH REMOVAL Right 12/06/2017   Procedure: REMOVAL PORT-A-CATH;  Surgeon: Wallace Going, DO;  Location: [place];  Service: Plastics;  Laterality: Right;  . PORTACATH PLACEMENT Right 06/01/2017   Procedure: INSERTION PORT-A-CATH RIGHT SUBCLAVIAN;  Surgeon: [name], MD;  Location: [place];  Service: General;  Laterality: Right;  . WISDOM TOOTH EXTRACTION      There were no vitals filed for this visit.  Subjective Assessment - 01/26/18 0901    Subjective  I am here because I am feeling tight from the radiation.     Pertinent History  Patient was diagnosed on 03/21/17 with left invasive lobular carcinoma breast cancer. It measures 2.1 cm and is located in the upper outer quadrant. She also has 6 abnormal axillary lymph nodes and a node was biopsied and found to be positive for carcinoma.   04/27/2017 .bilateral mastecomy with ALND ( 21 nodes removed) with immedicate expanders. She is planning to have chemo, then have implant surgery and hysterectomy and then have radiation     Patient Stated Goals  to get rid of shoulder pain     Currently in Pain?  Yes    Pain Score  2     Pain Location  -- right pec    Pain Descriptors / Indicators  Discomfort                       [facility] Adult PT Treatment/Exercise - 01/26/18 0001      Manual Therapy   Soft tissue mobilization  gently to left pec during PROM    Passive ROM  to left shoulder in direction of abduction being careful not to overstretch due to fragile skin while pt is undergoing radiation                 Short Term Clinic Goals - 08/31/17 1249      CC Short Term Goal  #1   Title  pt will be knowledgable of lymphedema risk reduction practices     Status  Achieved      CC Short Term Goal  #2   Title  Pt will be independent in postural and shoulder stretching exercises    Status  Achieved       PT Long Term Goals - 01/26/18 1205      PT LONG TERM GOAL #1   Title  Pt will report that the pain and discomfort in left shoulder has decreased by 50%     Baseline  01/26/18- pt having increased tightness and discomfort in left shoulder since beginning radiation    Time  8    Period  Weeks    Status  On-going      PT LONG TERM GOAL #2   Title  Pt will be independent in upgraded HEP for scapular strength and ROM     Time  8    Period  Weeks    Status  On-going      Breast Clinic Goals - 03/29/17 1232      Patient will be able to verbalize understanding of pertinent lymphedema risk reduction practices relevant to her diagnosis specifically related to skin care.   Time  1    Period  Days    Status  Achieved      Patient will be able to return demonstrate and/or verbalize understanding of the post-op home exercise program related to regaining shoulder range of motion.   Time  1    Period  Days    Status  Achieved      Patient will be able to verbalize understanding of the importance of attending the postoperative After Breast Cancer Class for further lymphedema risk reduction education and therapeutic exercise.   Time  1    Period  Days    Status  Achieved       Long Term Clinic Goals - 08/31/17 1100      CC Long Term Goal  #1   Title  Pt will have 140 degrees of left shoulder abduction so that she can received radiation treatment     Baseline  05/25/2017: 60 degrees , 07/05/2017  140 degrees ,08/01/2017 155     Status  Achieved      CC Long Term Goal  #2   Title  Pt will ahve 150 degrees of right shoulder abduction so that she can perform household  activities without difficulty     Baseline  05/25/2017: 95 degrees , 155 degrees on 07/05/2017, 167 on 08/01/2017 , 08/31/2017:  180 degrees  Status  Achieved      CC Long Term Goal  #3   Title  Pt will be independent in strength ABC program with knowledge of how to safely increase her intensity of exercise    Status  Achieved      CC Long Term Goal  #4   Title  Pt will decrease Quick DASH score to < 20 indicating a function improvment of arms     Baseline  52.27 on 05/25/2017 , 4.55 on 08/31/2017    Status  Achieved      CC Long Term Goal  #5   Title   will have a report a reduction in cording left axilla by 75%    Baseline  cording is still present but decreased by 50%    Status  Partially Met      CC Long Term Goal  #6   Title  Pt will be able to return to plank and side crunch exercise that she wants to do without pain     Status  Achieved         Plan - 01/26/18 1202    Clinical Impression Statement  Pt is having increased tightness at left pec since starting radiation. Performed PROM to left shoulder being mindful of fragile skin from radiation. Also performed gentle soft tissue mobilization to left pec. Encouraged pt to continue with cane exercises at home and to avoid any exercises that place stress on area of compromised skin. Pt would benefit from additional skilled PT visits to continue to decrease shoulder tightness to avoid limited left shoulder ROM as pt continues radiation.    Rehab Potential  Good    Clinical Impairments Affecting Rehab Potential  21 nodes removed in left axilla     PT Frequency  1x / week    PT Duration  8 weeks    PT Treatment/Interventions  Therapeutic exercise;Patient/family education;ADLs/Self Care Home Management;Taping;DME Instruction;Dry needling;Functional mobility training;Therapeutic activities;Manual lymph drainage;Manual techniques;Passive range of motion    PT Next Visit Plan  gentle ROM To left shoulder being mindful of radiated skin     PT Home Exercise Plan  Post op shoulder ROM HEP    Consulted and Agree with Plan of Care  Patient       Patient will benefit from skilled therapeutic intervention in order to improve the following deficits and impairments:  Decreased range of motion, Pain, Postural dysfunction, Increased fascial restricitons, Impaired perceived functional ability  Visit Diagnosis: Stiffness of left shoulder, not elsewhere classified  Acute pain of left shoulder  Aftercare following surgery for neoplasm  Abnormal posture  Stiffness of right shoulder, not elsewhere classified  Pain of right shoulder joint on movement     Problem List Patient Active Problem List   Diagnosis Date Noted  . Port-A-Cath in place 08/18/2017  . Genetic testing 04/17/2017  . BRCA2 gene mutation positive 04/17/2017  . Family history of breast cancer   . Family history of ovarian cancer   . 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 ([place]) 03/29/2017  . Cough 03/10/2014  . Esophagitis 03/10/2014  . Chronic ITP (idiopathic thrombocytopenia) (HCC) 03/10/2014  . ALLERGIC REACTION, ACUTE 07/22/2008    Allyson Sabal [facility] 01/26/2018, 12:09 PM  [place], [place], [address] Phone: [phone]   Fax:  [phone]  Name: Madison Savage MRN: [id number] Date of Birth: [birth year]  Manus Gunning, PT 01/26/18 12:09 PM

## 2018-01-30 ENCOUNTER — Ambulatory Visit
Admission: RE | Admit: 2018-01-30 | Discharge: 2018-01-30 | Disposition: A | Discharge status: Home / Self Care | Payer: BLUE CROSS/BLUE SHIELD | Source: Ambulatory Visit | Attending: Radiation Oncology | Admitting: Radiation Oncology

## 2018-01-30 ENCOUNTER — Telehealth: Payer: Self-pay | Admitting: *Deleted

## 2018-01-30 DIAGNOSIS — Z510 Encounter for antineoplastic radiation therapy: Secondary | ICD-10-CM | POA: Diagnosis not present

## 2018-01-30 DIAGNOSIS — C50412 Malignant neoplasm of upper-outer quadrant of left female breast: Secondary | ICD-10-CM | POA: Diagnosis not present

## 2018-01-30 DIAGNOSIS — Z170 Estrogen receptor positive status [ER+]: Secondary | ICD-10-CM | POA: Diagnosis not present

## 2018-01-30 NOTE — Telephone Encounter (Signed)
Pt called with c/o "knot" in axilla. Relate "squishy and movable". Discussed with pt this is same side as ALND and could likely be scar tissue or fat necrosis. Physician team notified of pt complaint.

## 2018-01-31 ENCOUNTER — Ambulatory Visit
Admission: RE | Admit: 2018-01-31 | Discharge: 2018-01-31 | Disposition: A | Discharge status: Home / Self Care | Payer: BLUE CROSS/BLUE SHIELD | Source: Ambulatory Visit | Attending: Radiation Oncology | Admitting: Radiation Oncology

## 2018-01-31 DIAGNOSIS — Z170 Estrogen receptor positive status [ER+]: Secondary | ICD-10-CM | POA: Diagnosis not present

## 2018-01-31 DIAGNOSIS — C50412 Malignant neoplasm of upper-outer quadrant of left female breast: Secondary | ICD-10-CM | POA: Diagnosis not present

## 2018-01-31 DIAGNOSIS — Z510 Encounter for antineoplastic radiation therapy: Secondary | ICD-10-CM | POA: Diagnosis not present

## 2018-02-01 ENCOUNTER — Ambulatory Visit
Admission: RE | Admit: 2018-02-01 | Discharge: 2018-02-01 | Disposition: A | Discharge status: Home / Self Care | Payer: BLUE CROSS/BLUE SHIELD | Source: Ambulatory Visit | Attending: Radiation Oncology | Admitting: Radiation Oncology

## 2018-02-01 DIAGNOSIS — Z170 Estrogen receptor positive status [ER+]: Secondary | ICD-10-CM | POA: Diagnosis not present

## 2018-02-01 DIAGNOSIS — Z510 Encounter for antineoplastic radiation therapy: Secondary | ICD-10-CM | POA: Diagnosis not present

## 2018-02-01 DIAGNOSIS — C50412 Malignant neoplasm of upper-outer quadrant of left female breast: Secondary | ICD-10-CM | POA: Diagnosis not present

## 2018-02-02 ENCOUNTER — Encounter

## 2018-02-02 ENCOUNTER — Ambulatory Visit
Admission: RE | Admit: 2018-02-02 | Discharge: 2018-02-02 | Disposition: A | Discharge status: Home / Self Care | Payer: BLUE CROSS/BLUE SHIELD | Source: Ambulatory Visit | Attending: Radiation Oncology | Admitting: Radiation Oncology

## 2018-02-02 ENCOUNTER — Ambulatory Visit: Payer: BLUE CROSS/BLUE SHIELD

## 2018-02-02 DIAGNOSIS — Z510 Encounter for antineoplastic radiation therapy: Secondary | ICD-10-CM | POA: Diagnosis not present

## 2018-02-02 DIAGNOSIS — Z170 Estrogen receptor positive status [ER+]: Secondary | ICD-10-CM | POA: Diagnosis not present

## 2018-02-02 DIAGNOSIS — C50412 Malignant neoplasm of upper-outer quadrant of left female breast: Secondary | ICD-10-CM | POA: Diagnosis not present

## 2018-02-05 ENCOUNTER — Ambulatory Visit
Admission: RE | Admit: 2018-02-05 | Discharge: 2018-02-05 | Disposition: A | Discharge status: Home / Self Care | Payer: BLUE CROSS/BLUE SHIELD | Source: Ambulatory Visit | Attending: Radiation Oncology | Admitting: Radiation Oncology

## 2018-02-05 DIAGNOSIS — R590 Localized enlarged lymph nodes: Secondary | ICD-10-CM | POA: Insufficient documentation

## 2018-02-05 DIAGNOSIS — Z170 Estrogen receptor positive status [ER+]: Secondary | ICD-10-CM | POA: Insufficient documentation

## 2018-02-05 DIAGNOSIS — Z510 Encounter for antineoplastic radiation therapy: Secondary | ICD-10-CM | POA: Diagnosis not present

## 2018-02-05 DIAGNOSIS — C50412 Malignant neoplasm of upper-outer quadrant of left female breast: Secondary | ICD-10-CM | POA: Diagnosis not present

## 2018-02-05 DIAGNOSIS — Z9012 Acquired absence of left breast and nipple: Secondary | ICD-10-CM | POA: Insufficient documentation

## 2018-02-06 ENCOUNTER — Ambulatory Visit
Admission: RE | Admit: 2018-02-06 | Discharge: 2018-02-06 | Disposition: A | Discharge status: Home / Self Care | Payer: BLUE CROSS/BLUE SHIELD | Source: Ambulatory Visit | Attending: Radiation Oncology | Admitting: Radiation Oncology

## 2018-02-06 DIAGNOSIS — R590 Localized enlarged lymph nodes: Secondary | ICD-10-CM | POA: Diagnosis not present

## 2018-02-06 DIAGNOSIS — Z9012 Acquired absence of left breast and nipple: Secondary | ICD-10-CM | POA: Diagnosis not present

## 2018-02-06 DIAGNOSIS — Z170 Estrogen receptor positive status [ER+]: Secondary | ICD-10-CM | POA: Diagnosis not present

## 2018-02-06 DIAGNOSIS — C50412 Malignant neoplasm of upper-outer quadrant of left female breast: Secondary | ICD-10-CM | POA: Diagnosis not present

## 2018-02-06 DIAGNOSIS — Z510 Encounter for antineoplastic radiation therapy: Secondary | ICD-10-CM | POA: Diagnosis not present

## 2018-02-07 ENCOUNTER — Ambulatory Visit
Admission: RE | Admit: 2018-02-07 | Discharge: 2018-02-07 | Disposition: A | Discharge status: Home / Self Care | Payer: BLUE CROSS/BLUE SHIELD | Source: Ambulatory Visit | Attending: Radiation Oncology | Admitting: Radiation Oncology

## 2018-02-07 DIAGNOSIS — C50412 Malignant neoplasm of upper-outer quadrant of left female breast: Secondary | ICD-10-CM | POA: Diagnosis not present

## 2018-02-07 DIAGNOSIS — Z9012 Acquired absence of left breast and nipple: Secondary | ICD-10-CM | POA: Diagnosis not present

## 2018-02-07 DIAGNOSIS — Z510 Encounter for antineoplastic radiation therapy: Secondary | ICD-10-CM | POA: Diagnosis not present

## 2018-02-07 DIAGNOSIS — R590 Localized enlarged lymph nodes: Secondary | ICD-10-CM | POA: Diagnosis not present

## 2018-02-07 DIAGNOSIS — Z170 Estrogen receptor positive status [ER+]: Secondary | ICD-10-CM | POA: Diagnosis not present

## 2018-02-08 ENCOUNTER — Ambulatory Visit: Payer: BLUE CROSS/BLUE SHIELD | Attending: General Surgery | Admitting: Physical Therapy

## 2018-02-08 ENCOUNTER — Encounter: Payer: Self-pay | Admitting: Physical Therapy

## 2018-02-08 ENCOUNTER — Ambulatory Visit
Admission: RE | Admit: 2018-02-08 | Discharge: 2018-02-08 | Disposition: A | Discharge status: Home / Self Care | Payer: BLUE CROSS/BLUE SHIELD | Source: Ambulatory Visit | Attending: Radiation Oncology | Admitting: Radiation Oncology

## 2018-02-08 ENCOUNTER — Ambulatory Visit: Payer: BLUE CROSS/BLUE SHIELD | Admitting: Physical Therapy

## 2018-02-08 DIAGNOSIS — R293 Abnormal posture: Secondary | ICD-10-CM | POA: Diagnosis not present

## 2018-02-08 DIAGNOSIS — Z510 Encounter for antineoplastic radiation therapy: Secondary | ICD-10-CM | POA: Diagnosis not present

## 2018-02-08 DIAGNOSIS — Z9012 Acquired absence of left breast and nipple: Secondary | ICD-10-CM | POA: Diagnosis not present

## 2018-02-08 DIAGNOSIS — M25612 Stiffness of left shoulder, not elsewhere classified: Secondary | ICD-10-CM | POA: Insufficient documentation

## 2018-02-08 DIAGNOSIS — M25512 Pain in left shoulder: Secondary | ICD-10-CM | POA: Diagnosis not present

## 2018-02-08 DIAGNOSIS — Z170 Estrogen receptor positive status [ER+]: Secondary | ICD-10-CM | POA: Diagnosis not present

## 2018-02-08 DIAGNOSIS — R590 Localized enlarged lymph nodes: Secondary | ICD-10-CM | POA: Diagnosis not present

## 2018-02-08 DIAGNOSIS — C50412 Malignant neoplasm of upper-outer quadrant of left female breast: Secondary | ICD-10-CM | POA: Diagnosis not present

## 2018-02-08 DIAGNOSIS — Z483 Aftercare following surgery for neoplasm: Secondary | ICD-10-CM

## 2018-02-08 NOTE — Therapy (Signed)
[place], [place], [address] Phone: [phone]   Fax:  [phone]  Physical Therapy Treatment  Patient Details  Name: Madison Savage MRN: [id number] Date of Birth: September 04, 1971 Referring Provider: Dr. Burr Medico   Encounter Date: 02/08/2018  PT End of Session - 02/08/18 1945    Visit Number  7    Number of Visits  9    Date for PT Re-Evaluation  03/23/18    PT Start Time  1300    PT Stop Time  1345    PT Time Calculation (min)  45 min    Activity Tolerance  Patient tolerated treatment well    Behavior During Therapy  [facility] for tasks assessed/performed       Past Medical History:  Diagnosis Date  . Anemia   . Anxiety   . Cancer ([place]) 03/2017   left breast cancer  . Chronic ITP (idiopathic thrombocytopenia) (HCC)   . Family history of breast cancer   . Family history of ovarian cancer   . Headache    last migraine...9/24  . Heart murmur    at birth.  none now  . MTHFR mutation [facility])     Past Surgical History:  Procedure Laterality Date  . AXILLARY LYMPH NODE DISSECTION Left 04/27/2017   Procedure: AXILLARY LYMPH NODE DISSECTION;  Surgeon: [name], MD;  Location: [place];  Service: General;  Laterality: Left;  . BILATERAL TOTAL MASTECTOMY WITH AXILLARY LYMPH NODE DISSECTION Bilateral 04/27/2017   Procedure: SKIN SPARING MASTECTOMY;  Surgeon: [name], MD;  Location: [place];  Service: General;  Laterality: Bilateral;  . BREAST CAPSULECTOMY WITH IMPLANT EXCHANGE Bilateral 12/06/2017   Procedure: BREAST CAPSULECTOMY WITH IMPLANT EXCHANGE;  Surgeon: Wallace Going, DO;  Location: [place];  Service: Plastics;  Laterality: Bilateral;  . BREAST RECONSTRUCTION WITH PLACEMENT OF TISSUE EXPANDER AND FLEX HD (ACELLULAR HYDRATED DERMIS) Bilateral 04/27/2017   Procedure: BILATERAL BREAST RECONSTRUCTION WITH PLACEMENT OF TISSUE EXPANDER AND FLEX  HD (ACELLULAR HYDRATED DERMIS);  Surgeon: Wallace Going, DO;  Location: [place];  Service: Plastics;  Laterality: Bilateral;  . BREAST SURGERY    . MASTECTOMY    . PLACEMENT OF BREAST IMPLANTS Bilateral 12/06/2017   Procedure: PLACEMENT OF BREAST IMPLANTS;  Surgeon: Wallace Going, DO;  Location: [place];  Service: Plastics;  Laterality: Bilateral;  . PORT-A-CATH REMOVAL Right 12/06/2017   Procedure: REMOVAL PORT-A-CATH;  Surgeon: Wallace Going, DO;  Location: [place];  Service: Plastics;  Laterality: Right;  . PORTACATH PLACEMENT Right 06/01/2017   Procedure: INSERTION PORT-A-CATH RIGHT SUBCLAVIAN;  Surgeon: [name], MD;  Location: [place];  Service: General;  Laterality: Right;  . WISDOM TOOTH EXTRACTION      There were no vitals filed for this visit.  Subjective Assessment - 02/08/18 1940    Subjective  Pt states she is tight from radiation and has been stretching at home.  She is very sore in axilla from skin changes from radiation    Pertinent History  Patient was diagnosed on 03/21/17 with left invasive lobular carcinoma breast cancer. It measures 2.1 cm and is located in the upper outer quadrant. She also has 6 abnormal axillary lymph nodes and a node was biopsied and found to be positive for carcinoma.   04/27/2017 .bilateral mastecomy with ALND ( 21 nodes removed) with immedicate expanders. She is planning to have chemo, then have implant surgery  and hysterectomy and then have radiation     Patient Stated Goals  to get rid of shoulder pain     Currently in Pain?  Yes    Pain Score  2     Pain Location  Shoulder    Pain Orientation  Left    Pain Descriptors / Indicators  Tightness                       [facility] Adult PT Treatment/Exercise - 02/08/18 0001      Exercises   Other Exercises   with left arm at side, backward thoracic rotation       Manual Therapy   Soft tissue mobilization  to left  upper trap area of tightness and left deltoid with massage cream     Myofascial Release  gentle prolonged pressure to tight trigger points in left upper trap.    Passive ROM  to left shoulder                Short Term Clinic Goals - 08/31/17 1249      CC Short Term Goal  #1   Title  pt will be knowledgable of lymphedema risk reduction practices     Status  Achieved      CC Short Term Goal  #2   Title  Pt will be independent in postural and shoulder stretching exercises    Status  Achieved       PT Long Term Goals - 01/26/18 1205      PT LONG TERM GOAL #1   Title  Pt will report that the pain and discomfort in left shoulder has decreased by 50%     Baseline  01/26/18- pt having increased tightness and discomfort in left shoulder since beginning radiation    Time  8    Period  Weeks    Status  On-going      PT LONG TERM GOAL #2   Title  Pt will be independent in upgraded HEP for scapular strength and ROM     Time  8    Period  Weeks    Status  On-going      Breast Clinic Goals - 03/29/17 1232      Patient will be able to verbalize understanding of pertinent lymphedema risk reduction practices relevant to her diagnosis specifically related to skin care.   Time  1    Period  Days    Status  Achieved      Patient will be able to return demonstrate and/or verbalize understanding of the post-op home exercise program related to regaining shoulder range of motion.   Time  1    Period  Days    Status  Achieved      Patient will be able to verbalize understanding of the importance of attending the postoperative After Breast Cancer Class for further lymphedema risk reduction education and therapeutic exercise.   Time  1    Period  Days    Status  Achieved       Long Term Clinic Goals - 08/31/17 1100      CC Long Term Goal  #1   Title  Pt will have 140 degrees of left shoulder abduction so that she can received radiation treatment     Baseline  05/25/2017: 60  degrees , 07/05/2017  140 degrees ,08/01/2017 155     Status  Achieved      CC Long Term Goal  #2  Title  Pt will ahve 150 degrees of right shoulder abduction so that she can perform household activities without difficulty     Baseline  05/25/2017: 95 degrees , 155 degrees on 07/05/2017, 167 on 08/01/2017 , 08/31/2017:  180 degrees    Status  Achieved      CC Long Term Goal  #3   Title  Pt will be independent in strength ABC program with knowledge of how to safely increase her intensity of exercise    Status  Achieved      CC Long Term Goal  #4   Title  Pt will decrease Quick DASH score to < 20 indicating a function improvment of arms     Baseline  52.27 on 05/25/2017 , 4.55 on 08/31/2017    Status  Achieved      CC Long Term Goal  #5   Title   will have a report a reduction in cording left axilla by 75%    Baseline  cording is still present but decreased by 50%    Status  Partially Met      CC Long Term Goal  #6   Title  Pt will be able to return to plank and side crunch exercise that she wants to do without pain     Status  Achieved         Plan - 02/08/18 1945    Clinical Impression Statement  Pt dark red skin in radiation field, especailly in left axilla.  She is protecting left incision with dressing when not in radiation. Treatment today was very gentle and pt was encouraged to be very careful with her irradiated skin in these last few weeks of treatment.  Encouraged her not to overstretch. but keep moving in painfree range. Feel that we should hold off on PT until a few weeks after radiation when pt will be able to tolerate more aggressive manual work and stretching     Rehab Potential  Good    Clinical Impairments Affecting Rehab Potential  21 nodes removed in left axilla     PT Next Visit Plan  Reassess ROM and progress with exercise and manual work     [employer] and Agree with Plan of Care  Patient       Patient will benefit from skilled therapeutic intervention in  order to improve the following deficits and impairments:  Decreased range of motion, Pain, Postural dysfunction, Increased fascial restricitons, Impaired perceived functional ability  Visit Diagnosis: Stiffness of left shoulder, not elsewhere classified  Acute pain of left shoulder  Aftercare following surgery for neoplasm  Abnormal posture     Problem List Patient Active Problem List   Diagnosis Date Noted  . Port-A-Cath in place 08/18/2017  . Genetic testing 04/17/2017  . BRCA2 gene mutation positive 04/17/2017  . Family history of breast cancer   . Family history of ovarian cancer   . 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 ([place]) 03/29/2017  . Cough 03/10/2014  . Esophagitis 03/10/2014  . Chronic ITP (idiopathic thrombocytopenia) (HCC) 03/10/2014  . ALLERGIC REACTION, ACUTE 07/22/2008   Donato Heinz. Owens Shark PT  Norwood Levo 02/08/2018, 7:50 PM  [place], [place], [address] Phone: [phone]   Fax:  [phone]  Name: GINNA SCHUUR MRN: [id number] Date of Birth: [birth year]

## 2018-02-09 ENCOUNTER — Ambulatory Visit
Admission: RE | Admit: 2018-02-09 | Discharge: 2018-02-09 | Disposition: A | Discharge status: Home / Self Care | Payer: BLUE CROSS/BLUE SHIELD | Source: Ambulatory Visit | Attending: Radiation Oncology | Admitting: Radiation Oncology

## 2018-02-09 DIAGNOSIS — Z170 Estrogen receptor positive status [ER+]: Secondary | ICD-10-CM | POA: Diagnosis not present

## 2018-02-09 DIAGNOSIS — R590 Localized enlarged lymph nodes: Secondary | ICD-10-CM | POA: Diagnosis not present

## 2018-02-09 DIAGNOSIS — C50412 Malignant neoplasm of upper-outer quadrant of left female breast: Secondary | ICD-10-CM | POA: Diagnosis not present

## 2018-02-09 DIAGNOSIS — Z9012 Acquired absence of left breast and nipple: Secondary | ICD-10-CM | POA: Diagnosis not present

## 2018-02-09 DIAGNOSIS — Z510 Encounter for antineoplastic radiation therapy: Secondary | ICD-10-CM | POA: Diagnosis not present

## 2018-02-09 NOTE — Progress Notes (Signed)
[place]  [phone]) [phone] [phone]  Clinic Follow up Note   Patient Care Team: Dorena Cookey, MD as PCP - General [name], MD as Consulting Physician (General Surgery) [name], MD as Consulting Physician (Hematology) Kyung Rudd, MD as Consulting Physician (Radiation Oncology) Brien Few, MD as Consulting Physician (Obstetrics and Gynecology) Delice Bison Charlestine Massed, NP as Nurse Practitioner (Hematology and Oncology)   Date of Service:  02/12/2018  CHIEF COMPLAINTS:  Follow up left breast cancer   Oncology History   Cancer Staging Breast cancer metastasized to axillary lymph node, left [facility]) Staging form: Breast, AJCC 8th Edition - Clinical stage from 03/22/2017: Stage Unknown (cTX, cN1, cM0, GX, ER: Positive, PR: Positive, HER2: Negative) - Signed by [name], MD on 03/29/2017 - Pathologic stage from 04/27/2017: Stage IB (pT1c, pN2a, cM0, G2, ER: Positive, PR: Positive, HER2: Negative) - Signed by [name], MD on 05/25/2017 _0     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 ([place])   03/21/2017 Mammogram    Diagnostic mammogram bilateral 03/21/17 IMPRESSION: INCOMPLETE - ADDITIONAL IMAGING EVALUATION NEEDED 1. The 0.6 cm oval mass in the right breast at 11 o'clock middle depth is indeterminate. An ultrasound is recommended.  2. The 0.5cm round focal asymmetry in the left breast central to the nipple middle depth is indeterminate. An ultrasound is recommended.  3. Ultrasound of the palpable abnormalities in the axilla and far left lateral breast is recommended.  4. Multiple clusters of pleomorphic calcifications in the left upper outer breast anterior depth spanning a 2.5 cm area are highly suggestive of malignancy. A stereotactic biopsy is recommended.        03/21/2017 Imaging    US Breast bilateral 03/21/17 IMPRESSION: HIGHLY SUGGESTIVE OF MALIGNANCY 1. Six distinct abnormal left axillary nodes, 2 of which  correspond to the palpable lumps felt by the patient. Findings concerning for metastatic adenopathy. Ultrasound guided biopsy of one of these nodes is recommended.  2. Small 5 mm palpable, oval mass in the left breast 3:00 areolar margin is at an intermediate suspicion. Ultrasound guided biopsy is recommended.  3. Isoechoic 8 mm mass in the right breast 10:00 5 cm from the nipple is also an intermediate suspicion for malignancy, and ultrasound guided biopsy is recommended.  4. Stereotactic guided biopsy for the highly suspicious calcifications in the left breast also recommended as detailed in the mammography report.        03/22/2017 Initial Biopsy    Diagnosis 03/22/17 Lymph node, needle/core biopsy, left axillary node -METASTATIC CARCINOMA, SEE COMMENT Microscopic Comment      03/22/2017 Receptors her2    Lymph node biopsy ER 95% positive, strong staining, PR 5% positive, weak staining, HER-2 negative, with HER2/CEP17 ratio 1.23 and copy #3.95.      03/27/2017 Pathology Results    Diagnosis 03/27/17 1. Breast, left, needle core biopsy -FIBROADENOMATOID NODULE WITH CALCIFICATIONS 2.Breast, left, needle core biopsy -DUCTAL CARCINOMA IN SITU, HIGH GRADE -SEE COMMENT  3. Breast, right, needle, core biopsy -FIBROADENOMA -NO MALIGNANCY IDENTIFIED       03/29/2017 Initial Diagnosis    Breast cancer metastasized to axillary lymph node, left ([place])      04/07/2017 Imaging    CT CAP 04/07/17 IMPRESSION: 1. Several prominent left axillary lymph nodes which may correspond to biopsy proven axillary lymph node metastasis. 2. No thoracic adenopathy identified or evidence of distant metastatic disease.      04/07/2017 Imaging    Bone whole body scan  04/07/17 IMPRESSION: Negative for evidence of osseous metastatic disease.      04/17/2017 Genetic Testing    BRCA2 c.[id number] (p.Glu260Serfs*15) pathogenic mutation identified on the 9 gene STAT panel.  The STAT Breast cancer panel offered by  [facility] includes sequencing and rearrangement analysis for the following 9 genes:  ATM, BRCA1, BRCA2, CDH1, CHEK2, PALB2, PTEN, STK11 and TP53.   The report date is April 17, 2017.  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  The Hereditary Gene Panel offered by [facility] includes sequencing and/or deletion duplication testing of the following 46 genes: APC, ATM, AXIN2, BARD1, BMPR1A, BRCA1, BRCA2, BRIP1, CDH1, CDKN2A (p14ARF), CDKN2A (p16INK4a), CHEK2, CTNNA1, DICER1, EPCAM (Deletion/duplication testing only), GREM1 (promoter region deletion/duplication testing only), KIT, MEN1, MLH1, MSH2, MSH3, MSH6, MUTYH, NBN, NF1, NHTL1, PALB2, PDGFRA, PMS2, POLD1, POLE, PTEN, RAD50, RAD51C, RAD51D, SDHB, SDHC, SDHD, SMAD4, SMARCA4. STK11, TP53, TSC1, TSC2, and VHL.  The following genes were evaluated for sequence changes only: SDHA and HOXB13 c.251G>A variant only.  The report date is April 29, 2017.       04/27/2017 Surgery    Surgey 04/27/17 BILATERAL SKIN SPARING MASTECTOMY and LEFT AXILLARY LYMPH NODE and BILATERAL BREAST RECONSTRUCTION WITH PLACEMENT OF TISSUE EXPANDER AND FLEX HD (ACELLULAR HYDRATED DERMIS) Bilateral BY Dr. Byerly and Dr. Dillingham       04/27/2017 Pathology Results     Diagnosis  04/27/17 1. Breast, simple mastectomy, Right - FIBROCYSTIC CHANGES WITH ADENOSIS. - USUAL DUCTAL HYPERPLASIA. - HEALING BIOPSY SITE. - THERE IS NO EVIDENCE OF MALIGNANCY. 2. Breast, simple mastectomy, Left - INVASIVE DUCTAL CARCINOMA, GRADE II/III, SPANNING 1.2 CM. - DUCTAL CARCINOMA IN SITU WITH CALCIFICATIONS, HIGH GRADE. - INVASIVE DUCTAL CARCINOMA IS FOCALLY PRESENT AT THE ANTERIOR MARGIN. - DUCTAL CARCINOMA IN SITU IS BROADLY 0.1 CM TO THE POSTERIOR MARGIN. - SEE ONCOLOGY TABLE BELOW. 3. Lymph nodes, regional resection, Left axillary - METASTATIC CARCINOMA IN 8 OF 21 LYMPH NODES (8/21). -  SEE COMMENT      06/09/2017 - 11/16/2017 Chemotherapy    Adriamycin and Cytoxan every 2 weeks, for 4 cycles 06/09/17-07/21/17, followed by weekly Abraxane and carboplatin for 12 weeks starting 08/11/17. Held Carbo with cycle 2 due to poor toleration. Changed to single agent Abraxane for Cycle 3 on 09/08/17. Completed on 11/16/17 with reduced Abraxane due to body aches.       12/06/2017 Surgery    BREAST CAPSULECTOMY WITH IMPLANT EXCHANGE and PLACEMENT OF BREAST IMPLANTS and REMOVAL PORT-A-CATH  by Dr. Dillingham on 12/06/17       Anti-estrogen oral therapy    Recommendation for potentially Tamoxifen--then Anastrozole after TAH/BSO -plan to start Tmaoxifen in late July.       12/18/2017 Survivorship    Survivorship clinic with NP Lindsey       01/03/2018 -  Radiation Therapy    Radiation therapy 01/03/18 and plans to complete on 02/19/18.        HISTORY OF PRESENTING ILLNESS: 03/29/17 Madison Savage 46 y.o. female is here because of newly diagnosed Breast cancer metastasized to axillary lymph node, left. She presents to Breast Clinic today with her husband. She felt the lump herself June 22nd. It was soft and "squishy" and it moved. First in the left breast and then four days latter in the left axilla. She feels they have gotten smaller.   In the past she was diagnosed with being ITP positive. Period has been irregular more since her   last miscarriage in 2015. She has MTHFR gene. In pregnancy her plt counts have gone low. She has not seen homologist for this. All 3 children were vaginal births.  Today she reports that with the lumps she feels a dull ache that comes and goes. She has not noticed any nipple or skin change. She had not had a mammogram since she was 73. Her PCP was going to set her up with a mammogram last year. Dr. Ronita Hipps is her OB and Dr. Sherren Mocha is her current PCP. She feels no change in appetite or weight loss. She quit smoking 3 weeks ago. She works out Scientist, product/process development. She reports her  last attempt at the MRI, the contrast made her feel like she could not breathe, her head was pounding and she felt hot all over and she was shaking. The trouble breathing only lasted while she was in the MRI.  Husband shared concern with insurance and financial. And referrals to second opinions.    GYN HISTORY  Menarchal: 16 LMP: 03/05/2017 Contraceptive: none HRT: N/A G7P3: - 3 miscarriages, 1 abortion    CURRENT THERAPY:  Radiation 01/03/18-02/19/18   Plan to Start Tamoxifen in late July    INTERVAL HISTORY:  Madison Savage is here for a follow up. She presents to the clinic today accompanied by her daughter. She notes she is tolerating her radiation well. But has itching, red and hyperpigmentation of left breast and axilla. She notes pain in her peck muscle. She has postponed the rest of her PT until radiation is over. She notes her eyebrows and lashes fell out at the end of chemo treatment but have regrown and her hair is growing back. She notes she had not started Tamoxifen.    On review of symptoms, pt notes hair growth, return of taste and smell, discoloration of there left breast and axilla from radiation. She notes scar tissue in her right axilla. 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 She notes left shoulder tightness. She notes having hot flashes again.      MEDICAL HISTORY:  Past Medical History:  Diagnosis Date  . Anemia   . Anxiety   . Cancer ([place]) 03/2017   left breast cancer  . Chronic ITP (idiopathic thrombocytopenia) ([facility])   . Family history of breast cancer   . Family history of ovarian cancer   . Headache    last migraine...9/24  . Heart murmur    at birth.  none now  . MTHFR mutation [facility])     SURGICAL HISTORY: Past Surgical History:  Procedure Laterality Date  . AXILLARY LYMPH NODE DISSECTION Left 04/27/2017   Procedure: AXILLARY LYMPH NODE DISSECTION;  Surgeon: [name], MD;  Location: [place];   Service: General;  Laterality: Left;  . BILATERAL TOTAL MASTECTOMY WITH AXILLARY LYMPH NODE DISSECTION Bilateral 04/27/2017   Procedure: SKIN SPARING MASTECTOMY;  Surgeon: [name], MD;  Location: [place];  Service: General;  Laterality: Bilateral;  . BREAST CAPSULECTOMY WITH IMPLANT EXCHANGE Bilateral 12/06/2017   Procedure: BREAST CAPSULECTOMY WITH IMPLANT EXCHANGE;  Surgeon: Wallace Going, DO;  Location: [place];  Service: Plastics;  Laterality: Bilateral;  . BREAST RECONSTRUCTION WITH PLACEMENT OF TISSUE EXPANDER AND FLEX HD (ACELLULAR HYDRATED DERMIS) Bilateral 04/27/2017   Procedure: BILATERAL BREAST RECONSTRUCTION WITH PLACEMENT OF TISSUE EXPANDER AND FLEX HD (ACELLULAR HYDRATED DERMIS);  Surgeon: Wallace Going, DO;  Location: [place];  Service: Plastics;  Laterality: Bilateral;  .  BREAST SURGERY    . MASTECTOMY    . PLACEMENT OF BREAST IMPLANTS Bilateral 12/06/2017   Procedure: PLACEMENT OF BREAST IMPLANTS;  Surgeon: Wallace Going, DO;  Location: [place];  Service: Plastics;  Laterality: Bilateral;  . PORT-A-CATH REMOVAL Right 12/06/2017   Procedure: REMOVAL PORT-A-CATH;  Surgeon: Wallace Going, DO;  Location: [place];  Service: Plastics;  Laterality: Right;  . PORTACATH PLACEMENT Right 06/01/2017   Procedure: INSERTION PORT-A-CATH RIGHT SUBCLAVIAN;  Surgeon: [name], MD;  Location: [place];  Service: General;  Laterality: Right;  . WISDOM TOOTH EXTRACTION      SOCIAL HISTORY: Social History   Socioeconomic History  . Marital status: Married    Spouse name: Not on file  . Number of children: 3  . Years of education: Not on file  . Highest education level: Not on file  Occupational History    Employer: UNEMPLOYED  Social Needs  . Financial resource strain: Not on file  . Food insecurity:    Worry: Not on file    Inability: Not on file  . Transportation needs:      Medical: Not on file    Non-medical: Not on file  Tobacco Use  . Smoking status: Former Smoker    Packs/day: 0.50    Years: 20.00    Pack years: 10.00    Types: Cigarettes    Last attempt to quit: 03/05/2017    Years since quitting: 0.9  . Smokeless tobacco: Never Used  Substance and Sexual Activity  . Alcohol use: No  . Drug use: No  . Sexual activity: Yes    Partners: Male    Birth control/protection: None  Lifestyle  . Physical activity:    Days per week: Not on file    Minutes per session: Not on file  . Stress: Not on file  Relationships  . Social connections:    Talks on phone: Not on file    Gets together: Not on file    Attends religious service: Not on file    Active member of club or organization: Not on file    Attends meetings of clubs or organizations: Not on file    Relationship status: Not on file  . Intimate partner violence:    Fear of current or ex partner: Not on file    Emotionally abused: Not on file    Physically abused: Not on file    Forced sexual activity: Not on file  Other Topics Concern  . Not on file  Social History Narrative   Stay at home mom   3 children ages 72, 39, 31    FAMILY HISTORY: Family History  Problem Relation Age of Onset  . Breast cancer Paternal Aunt 7  . Ovarian cancer Paternal Aunt 39  . Cancer Paternal Grandmother        breast cancer   . Cancer Maternal Aunt        lymphoma   . Cancer Paternal Grandfather        throat cancer   . Cancer Paternal Aunt        lung cancer  . Cirrhosis Father   . AAA (abdominal aortic aneurysm) Maternal Grandfather   . Leukemia Paternal Aunt     ALLERGIES:  is allergic to clindamycin; penicillins; and codeine.  MEDICATIONS:  Current Outpatient Medications  Medication Sig Dispense Refill  . ALPRAZolam (XANAX) 0.25 MG tablet Take 1 tablet (0.25 mg total) by mouth at bedtime as  needed for anxiety. 20 tablet 0  . b complex vitamins capsule Take 1 capsule by mouth daily.     . MILK THISTLE EXTRACT PO Take by mouth.    . Multiple Vitamins-Calcium (ONE-A-DAY WOMENS PO) Take 1 tablet by mouth daily.    . OVER THE COUNTER MEDICATION Take 1 capsule by mouth daily. Biotin/MSM    . tamoxifen (NOLVADEX) 20 MG tablet Take 1 tablet (20 mg total) by mouth daily. 30 tablet 2   No current facility-administered medications for this visit.    Facility-Administered Medications Ordered in Other Visits  Medication Dose Route Frequency Provider Last Rate Last Dose  . sodium chloride flush (NS) 0.9 % injection 10 mL  10 mL Intravenous PRN Feng, Yan, MD   10 mL at 07/21/17 1323    REVIEW OF SYSTEMS:   Constitutional: Denies fevers, chills or abnormal night sweats (+) hair growth (+) hot flashes  (+) taste has returned Eyes: Denies blurriness of vision, double vision or watery eyes Ears, nose, mouth, throat, and face: Denies mucositis or sore throat  Respiratory: Denies dyspnea or wheezes   Cardiovascular: Denies palpitation, chest discomfort or lower extremity swelling Gastrointestinal:  Denies nausea, heartburn or change in bowel habits Skin: Denies abnormal skin rashes  Lymphatics: Denies new lymphadenopathy or easy bruising Breast: (+) redness and darkening of skin from radiation, itching on left breast.   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   PHYSICAL EXAMINATION: ECOG PERFORMANCE STATUS: 0 - Asymptomatic  Vitals:   02/12/18 1138  BP: 109/64  Pulse: 69  Resp: 18  Temp: 97.7 F (36.5 C)  SpO2: 99%   Filed Weights   02/12/18 1138  Weight: 121 lb 9.6 oz (55.2 kg)     GENERAL:alert, no distress and comfortable SKIN: skin color, texture, turgor are normal, no rashes or significant lesions   EYES: normal, conjunctiva are pink and non-injected, sclera  clear OROPHARYNX:no exudate, no erythema and lips, buccal mucosa, and tongue normal  NECK: supple, thyroid normal size, non-tender, without nodularity LYMPH:  no palpable lymphadenopathy in the cervical, axillary or inguinal LUNGS: clear to auscultation and percussion with normal breathing effort HEART: regular rate & rhythm and no murmurs and no lower extremity edema ABDOMEN:abdomen soft, non-tender and normal bowel sounds Musculoskeletal:no cyanosis of digits and no clubbing  PSYCH: alert & oriented x 3 with fluent speech NEURO: no focal motor/sensory deficits Breast: (+) Status post bilateral mastectomy and breast reconstruction with breast implants: surgical incisions have healed well. Skin erythema and hyperpigmentation from radiation of left breast and axilla  LABORATORY DATA:  I have reviewed the data as listed CBC Latest Ref Rng & Units 12/06/2017 11/16/2017 11/10/2017  WBC 4.0 - 10.5 K/uL 6.5 4.0 3.8(L)  Hemoglobin 12.0 - 15.0 g/dL 13.0 12.4 12.1  Hematocrit 36.0 - 46.0 % 39.3 36.8 36.0  Platelets 150 - 400 K/uL 176 200 196    CMP Latest Ref Rng & Units 11/16/2017 11/10/2017 11/03/2017  Glucose 70 - 140 mg/dL 110 96 96  BUN 7 - 26 mg/dL 15 14 11  Creatinine 0.60 - 1.10 mg/dL 0.83 0.83 0.84  Sodium 136 - 145 mmol/L 139 141 139  Potassium 3.5 - 5.1 mmol/L 3.9 4.0 4.2  Chloride 98 - 109 mmol/L 108 109 107  CO2 22 - 29 mmol/L 24 25   24  Calcium 8.4 - 10.4 mg/dL 9.0 9.3 9.3  Total Protein 6.4 - 8.3 g/dL 6.0(L) 6.0(L) 6.1(L)  Total Bilirubin 0.2 - 1.2 mg/dL 0.2 0.2 <0.2(L)  Alkaline Phos 40 - 150 U/L 61 64 65  AST 5 - 34 U/L _0 ALT 0 - 55 U/L _1 PATHOLOGY   Diagnosis  04/27/17 1. Breast, simple mastectomy, Right - FIBROCYSTIC CHANGES WITH ADENOSIS. - USUAL DUCTAL HYPERPLASIA. - HEALING BIOPSY SITE. - THERE IS NO EVIDENCE OF MALIGNANCY. 2. Breast, simple mastectomy, Left - INVASIVE DUCTAL CARCINOMA, GRADE II/III, SPANNING 1.2 CM. - DUCTAL CARCINOMA IN SITU WITH  CALCIFICATIONS, HIGH GRADE. - INVASIVE DUCTAL CARCINOMA IS FOCALLY PRESENT AT THE ANTERIOR MARGIN. - DUCTAL CARCINOMA IN SITU IS BROADLY 0.1 CM TO THE POSTERIOR MARGIN. - SEE ONCOLOGY TABLE BELOW. 3. Lymph nodes, regional resection, Left axillary - METASTATIC CARCINOMA IN 8 OF 21 LYMPH NODES (8/21). - SEE COMMENT. Microscopic Comment 2. BREAST, INVASIVE TUMOR Procedure: Simple mastectomy and axillary lymph node resections. Laterality: Left. Tumor Size: 1.2 cm (glass slide measurement). Histologic Type: Ductal Grade: II Tubular Differentiation: 3 Nuclear Pleomorphism: 3 Mitotic Count: 1 Ductal Carcinoma in Situ (DCIS): Present, high grade, extensive. Extent of Tumor: Confined to breast parenchyma. Margins: Invasive carcinoma, distance from closest margin: Focally present at the anterior margin. DCIS, distance from closest margin: Broadly less than 0.1 cm to the posterior margin. Regional Lymph Nodes: Number of Lymph Nodes Examined: 21 1 of 4 FINAL for Madison Savage, Madison Savage [phone]) Microscopic Comment(continued) Lymph Nodes with Macrometastases: 6 Lymph Nodes with Micrometastases: 0 Lymph Nodes with Isolated Tumor Cells: 2 Breast Prognostic Profile: [id number] Estrogen Receptor: 95%, strong. Progesterone Receptor: 5%, strong. Her2: No amplification was detected. The ratio is 1.23. Ki-67: 15%. Best tumor block for sendout testing: 2G Pathologic Stage Classification (pTNM, AJCC 8th Edition): Primary Tumor (pT): pT1c Regional Lymph Nodes (pN): pN2a Distant Metastases (pM): pMX Comments: Grossly, there is a 2.5 cm focus of indurated tissue identified. Histologic examination of this area reveals predominately high grade ductal carcinoma in situ with calcifications. There is associated invasive ductal carcinoma, which is E-cadherin positive, spanning 1.2 cm. (JBK:gt, 05/02/17) 3. Immunohistochemical stains for cytokeratin AE1/AE3 performed on multiple blocks on part 3 help  highlight the presence of metastatic carcinoma. (JBK:gt, 05/02/17)   Diagnosis 03/27/17 1. Breast, left, needle core biopsy -FIBROADENOMATOID NODULE WITH CALCIFICATIONS 2.Breast, left, needle core biopsy -DUCTAL CARCINOMA IN SITU, HIGH GRADE -SEE COMMENT  3. Breast, right, needle, core biopsy -FIBROADENOMA -NO MALIGNANCY IDENTIFIED  Diagnosis 03/22/17 Lymph node, needle/core biopsy, left axillary node -METASTATIC CARCINOMA, SEE COMMENT Microscopic Comment Sections showed lymph node tissue mostly replaced by metastatic carcinoma characterized by sheets of epithelioid appearing cells displaying milder to moderate nuclear pleomorphism, fine chromatin, small nucleoli, and moderately abundant amphophilic to eosinophilic cyto-plasma. Immunohistochemistry staining showed positive for cytokeratin AE1/AE3, GATA-3 and estrogen receptor, with patchy positivity for cytokeratin 7 and GCDFP., Only scattered cells showed progesterone positivity. Negative for S100, melan-a, E-cadherin, cytokeratin 20, CDX-2, TTF-1 or WT-1. The findings are consistent with metastatic carcinoma of breast primary. Lack of significant e-cadherin positivity suggested lobular carcinoma.   GENETICS 04/10/17    PROCEDURES   ECHO 05/31/17 Study Conclusions - Left ventricle: The cavity size was normal. Wall thickness was   normal. Systolic function was normal. The estimated ejection   fraction was in the range of 55% to 60%. Wall motion was normal;   there were no regional wall motion abnormalities. Impressions: - Normal  LV systolic and diastolic function; global longitudinal   strain - 17.9%.  RADIOGRAPHIC STUDIES: I have personally reviewed the radiological images as listed and agreed with the findings in the report.  CT CAP 04/07/17 IMPRESSION: 1. Several prominent left axillary lymph nodes which may correspond to biopsy proven axillary lymph node metastasis. 2. No thoracic adenopathy identified or evidence of  distant metastatic disease.   Bone whole body scan 04/07/17 IMPRESSION: Negative for evidence of osseous metastatic disease.  Diagnostic mammogram bilateral 03/21/17 IMPRESSION: INCOMPLETE - ADDITIONAL IMAGING EVALUATION NEEDED 1. The 0.6 cm oval mass in the right breast at 11 o'clock middle depth is indeterminate. An ultrasound is recommended.  2. The 0.5cm round focal asymmetry in the left breast central to the nipple middle depth is indeterminate. An ultrasound is recommended.  3. Ultrasound of the palpable abnormalities in the axilla and far left lateral breast is recommended.  4. Multiple clusters of pleomorphic calcifications in the left upper outer breast anterior depth spanning a 2.5 cm area are highly suggestive of malignancy. A stereotactic biopsy is recommended.   US Breast bilateral 03/21/17 IMPRESSION: HIGHLY SUGGESTIVE OF MALIGNANCY 1. Six distinct abnormal left axillary nodes, 2 of which correspond to the palpable lumps felt by the patient. Findings concerning for metastatic adenopathy. Ultrasound guided biopsy of one of these nodes is recommended.  2. Small 5 mm palpable, oval mass in the left breast 3:00 areolar margin is at an intermediate suspicion. Ultrasound guided biopsy is recommended.  3. Isoechoic 8 mm mass in the right breast 10:00 5 cm from the nipple is also an intermediate suspicion for malignancy, and ultrasound guided biopsy is recommended.  4. Stereotactic guided biopsy for the highly suspicious calcifications in the left breast also recommended as detailed in the mammography report.    ASSESSMENT & PLAN:  Madison Savage is a 46 y.o. perimenopausal female with a history of Chronic ITP and MTHFR mutation, presented with a palpable left axillary adenopathy.  1. 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 She had a 1.2 cm left breast invasive ductal carcinoma, with 8 positive lymph nodes. Surgical margins were negative. -her staging CT and bone scan was negative -She underwent Adjuvant AC on 06/09/17-07/21/17 followed by Abraxane and Carboplatin 08/11/17-11/16/17 weekly. Due to poor toleration Carboplatin was held from cycle 2. Pt has been using Cold cap and it worked  -She underwent Breast reconstruction and PAC removal on 12/06/17 -She started radiation on 01/03/18 and plans to complete on 02/19/18, she is tolerating well  -Given her ER positive disease and high risk for recurrence, I strongly recommended adjuvant anti-estrogen therapy. Given her young age, high risk disease, I strongly recommend her to consider ovarian suppression injection with an Aromatase inhibitor. I discussed side effects and the benefit of using an AI over tamoxifen. She declined starting ovarian suppression now. She agreed to start Tamoxifen after radiation, when she returns from vacation in late July. I filled prescription today.  -She plans to have BSO in the Fall 2019, I will switch her Tamoxifen to aromatase inhibitor after her BSO  -I suggest she return to PT a few weeks after radiation.  -I discussed the option of Effexor to help her hot flashes and mood. Given she is currently managing this   well, will consider Effexor when she starts Tamoxifen and undergoes BSO surgery.  -I discussed her eligibility for a clinical trail named the [facility] Study with [place] given her high risk breast cancer. She will think about this and if interested I will set consultation with our research nurse.  -I reviewed her breast cancer surveillance as she is high risk for late cancer recurrence.  She does not need annual mammogram, will continue self-exam, routine lab and follow-up -Gust that if she does not need routine CT or  bone scan, but I will consider scan if there is clinical concern for recurrence. -F/u in 2 months    2. Chronic ITP  -Discovered in her Last pregnancy, with platelet count dropped to between 50,000 and 60,000, recovered, her platelet counts is around 120-150K. -She does not follow up with hematologist  -Her platelets overall remained stable while on chemotherapy    3. 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 -She has had bilateral mastectomy, due to the high-risk of breast cancer -She plans to have hysterectomy and BSO in Fall of 2019.  -Patient has met our genetic counselor Mrs. Powell, BRCA2 G mutation screening in her children and siblings were recommended.    4. Chest pain, secondary to PAC and Tissue Expander -Managed with Norco as needed, much better now  -Manageable, continue monitoring  -She underwent PAC removal and breast reconstruction surgery on 12/06/17, pain resolved.   5. Insomnia and Anxiety  -I previously recommended her to use Benadryl and 10 mg melatonin OTC as needed.  -I last filled Ativan in October 2018. Her anxiety is mild-moderate, I filled Xanax on 11/03/17. She can take 0.5-1 tablet as needed for anxiety/sleep.  -I previously discussed post-treatment resources to help her adjust to her new normal. She has attended survivorship clinic in 12/2017.  -Her anxiety and overall mood has improved lately. She has tried to attend support groups post treatment.    PLAN:  -Prescribed Tamoxifen today to start in late July.  -lab and f/u in 2 months -will screening her for the NATALEE study, will refer her to  research team   No orders of the defined types were placed in this encounter.   All questions were answered. The patient knows to call the clinic with any problems, questions or concerns. I spent 25 minutes counseling the patient face to face. The total time spent in the appointment was 30 minutes and more than 50% was on counseling.  I, Amoya Bennett, am acting as scribe for Yan Feng, MD.   I have reviewed the above documentation for accuracy and completeness, and I agree with the above.     Yan Feng, MD 02/12/2018  

## 2018-02-12 ENCOUNTER — Telehealth: Payer: Self-pay | Admitting: Hematology

## 2018-02-12 ENCOUNTER — Encounter: Payer: Self-pay | Admitting: Hematology

## 2018-02-12 ENCOUNTER — Ambulatory Visit
Admission: RE | Admit: 2018-02-12 | Discharge: 2018-02-12 | Disposition: A | Discharge status: Home / Self Care | Payer: BLUE CROSS/BLUE SHIELD | Source: Ambulatory Visit | Attending: Radiation Oncology | Admitting: Radiation Oncology

## 2018-02-12 ENCOUNTER — Ambulatory Visit: Payer: BLUE CROSS/BLUE SHIELD

## 2018-02-12 ENCOUNTER — Inpatient Hospital Stay: Payer: BLUE CROSS/BLUE SHIELD | Attending: Hematology | Admitting: Hematology

## 2018-02-12 VITALS — BP 109/64 | HR 69 | Temp 97.7°F | Resp 18 | Ht 67.5 in | Wt 121.6 lb

## 2018-02-12 DIAGNOSIS — Z510 Encounter for antineoplastic radiation therapy: Secondary | ICD-10-CM | POA: Diagnosis not present

## 2018-02-12 DIAGNOSIS — D693 Immune thrombocytopenic purpura: Secondary | ICD-10-CM | POA: Insufficient documentation

## 2018-02-12 DIAGNOSIS — C50412 Malignant neoplasm of upper-outer quadrant of left female breast: Secondary | ICD-10-CM | POA: Insufficient documentation

## 2018-02-12 DIAGNOSIS — Z9012 Acquired absence of left breast and nipple: Secondary | ICD-10-CM | POA: Diagnosis not present

## 2018-02-12 DIAGNOSIS — G6282 Radiation-induced polyneuropathy: Secondary | ICD-10-CM | POA: Diagnosis not present

## 2018-02-12 DIAGNOSIS — N951 Menopausal and female climacteric states: Secondary | ICD-10-CM | POA: Insufficient documentation

## 2018-02-12 DIAGNOSIS — R590 Localized enlarged lymph nodes: Secondary | ICD-10-CM | POA: Diagnosis not present

## 2018-02-12 DIAGNOSIS — C773 Secondary and unspecified malignant neoplasm of axilla and upper limb lymph nodes: Secondary | ICD-10-CM | POA: Insufficient documentation

## 2018-02-12 DIAGNOSIS — Z170 Estrogen receptor positive status [ER+]: Secondary | ICD-10-CM | POA: Diagnosis not present

## 2018-02-12 MED ORDER — TAMOXIFEN CITRATE 20 MG PO TABS: 20.0000 mg | ORAL_TABLET | Freq: Every day | ORAL | 2 refills | Status: DC

## 2018-02-12 NOTE — Telephone Encounter (Signed)
Appointments scheduled Calendar/ letter mailed to patient per 6/10 los

## 2018-02-13 ENCOUNTER — Ambulatory Visit: Payer: BLUE CROSS/BLUE SHIELD

## 2018-02-13 ENCOUNTER — Ambulatory Visit
Admission: RE | Admit: 2018-02-13 | Discharge: 2018-02-13 | Disposition: A | Discharge status: Home / Self Care | Payer: BLUE CROSS/BLUE SHIELD | Source: Ambulatory Visit | Attending: Radiation Oncology | Admitting: Radiation Oncology

## 2018-02-13 DIAGNOSIS — Z9012 Acquired absence of left breast and nipple: Secondary | ICD-10-CM | POA: Diagnosis not present

## 2018-02-13 DIAGNOSIS — C50412 Malignant neoplasm of upper-outer quadrant of left female breast: Secondary | ICD-10-CM | POA: Diagnosis not present

## 2018-02-13 DIAGNOSIS — Z170 Estrogen receptor positive status [ER+]: Secondary | ICD-10-CM | POA: Diagnosis not present

## 2018-02-13 DIAGNOSIS — R590 Localized enlarged lymph nodes: Secondary | ICD-10-CM | POA: Diagnosis not present

## 2018-02-13 DIAGNOSIS — Z510 Encounter for antineoplastic radiation therapy: Secondary | ICD-10-CM | POA: Diagnosis not present

## 2018-02-14 ENCOUNTER — Ambulatory Visit
Admission: RE | Admit: 2018-02-14 | Discharge: 2018-02-14 | Disposition: A | Discharge status: Home / Self Care | Payer: BLUE CROSS/BLUE SHIELD | Source: Ambulatory Visit | Attending: Radiation Oncology | Admitting: Radiation Oncology

## 2018-02-14 DIAGNOSIS — Z510 Encounter for antineoplastic radiation therapy: Secondary | ICD-10-CM | POA: Diagnosis not present

## 2018-02-14 DIAGNOSIS — Z170 Estrogen receptor positive status [ER+]: Secondary | ICD-10-CM | POA: Diagnosis not present

## 2018-02-14 DIAGNOSIS — Z9012 Acquired absence of left breast and nipple: Secondary | ICD-10-CM | POA: Diagnosis not present

## 2018-02-14 DIAGNOSIS — R590 Localized enlarged lymph nodes: Secondary | ICD-10-CM | POA: Diagnosis not present

## 2018-02-14 DIAGNOSIS — C50412 Malignant neoplasm of upper-outer quadrant of left female breast: Secondary | ICD-10-CM | POA: Diagnosis not present

## 2018-02-15 ENCOUNTER — Encounter: Payer: Self-pay | Admitting: Hematology

## 2018-02-15 ENCOUNTER — Ambulatory Visit
Admission: RE | Admit: 2018-02-15 | Discharge: 2018-02-15 | Disposition: A | Discharge status: Home / Self Care | Payer: BLUE CROSS/BLUE SHIELD | Source: Ambulatory Visit | Attending: Radiation Oncology | Admitting: Radiation Oncology

## 2018-02-15 DIAGNOSIS — Z510 Encounter for antineoplastic radiation therapy: Secondary | ICD-10-CM | POA: Diagnosis not present

## 2018-02-15 DIAGNOSIS — C50412 Malignant neoplasm of upper-outer quadrant of left female breast: Secondary | ICD-10-CM | POA: Diagnosis not present

## 2018-02-15 DIAGNOSIS — Z9012 Acquired absence of left breast and nipple: Secondary | ICD-10-CM | POA: Diagnosis not present

## 2018-02-15 DIAGNOSIS — R590 Localized enlarged lymph nodes: Secondary | ICD-10-CM | POA: Diagnosis not present

## 2018-02-15 DIAGNOSIS — Z170 Estrogen receptor positive status [ER+]: Secondary | ICD-10-CM | POA: Diagnosis not present

## 2018-02-16 ENCOUNTER — Ambulatory Visit: Payer: BLUE CROSS/BLUE SHIELD

## 2018-02-16 ENCOUNTER — Ambulatory Visit
Admission: RE | Admit: 2018-02-16 | Discharge: 2018-02-16 | Disposition: A | Discharge status: Home / Self Care | Payer: BLUE CROSS/BLUE SHIELD | Source: Ambulatory Visit | Attending: Radiation Oncology | Admitting: Radiation Oncology

## 2018-02-16 DIAGNOSIS — Z510 Encounter for antineoplastic radiation therapy: Secondary | ICD-10-CM | POA: Diagnosis not present

## 2018-02-16 DIAGNOSIS — R590 Localized enlarged lymph nodes: Secondary | ICD-10-CM | POA: Diagnosis not present

## 2018-02-16 DIAGNOSIS — Z170 Estrogen receptor positive status [ER+]: Secondary | ICD-10-CM | POA: Diagnosis not present

## 2018-02-16 DIAGNOSIS — C50412 Malignant neoplasm of upper-outer quadrant of left female breast: Secondary | ICD-10-CM | POA: Diagnosis not present

## 2018-02-16 DIAGNOSIS — Z9012 Acquired absence of left breast and nipple: Secondary | ICD-10-CM | POA: Diagnosis not present

## 2018-02-17 ENCOUNTER — Encounter: Payer: Self-pay | Admitting: Radiation Oncology

## 2018-02-19 ENCOUNTER — Ambulatory Visit: Payer: BLUE CROSS/BLUE SHIELD

## 2018-02-19 ENCOUNTER — Ambulatory Visit
Admission: RE | Admit: 2018-02-19 | Discharge: 2018-02-19 | Disposition: A | Discharge status: Home / Self Care | Payer: BLUE CROSS/BLUE SHIELD | Source: Ambulatory Visit | Attending: Radiation Oncology | Admitting: Radiation Oncology

## 2018-02-19 ENCOUNTER — Encounter: Payer: Self-pay | Admitting: *Deleted

## 2018-02-19 DIAGNOSIS — Z9012 Acquired absence of left breast and nipple: Secondary | ICD-10-CM | POA: Diagnosis not present

## 2018-02-19 DIAGNOSIS — R590 Localized enlarged lymph nodes: Secondary | ICD-10-CM | POA: Diagnosis not present

## 2018-02-19 DIAGNOSIS — C50412 Malignant neoplasm of upper-outer quadrant of left female breast: Secondary | ICD-10-CM | POA: Diagnosis not present

## 2018-02-19 DIAGNOSIS — Z170 Estrogen receptor positive status [ER+]: Secondary | ICD-10-CM | POA: Diagnosis not present

## 2018-02-19 DIAGNOSIS — Z510 Encounter for antineoplastic radiation therapy: Secondary | ICD-10-CM | POA: Diagnosis not present

## 2018-02-21 NOTE — Progress Notes (Signed)
  Radiation Oncology         [phone] ________________________________  Name: Madison Savage MRN: [id number]  Date: 02/17/2018  DOB: March 29, 1971  End of Treatment Note  Diagnosis: Stage IB, pT1cNaM0, grade 2, ER/PR positive, invasive ductal carcinoma of the left breast.     Indication for treatment:  Curative       Radiation treatment dates:   01/03/18 - 02/17/18  Site/dose:    1.) 50.4 Gy directed to the Left Chest wall delivered in 28 fractions. 2.) 50.4 Gy directed to the Left SCLV delivered in 28 fractions. 3.) Boost, 10 Gy delivered in 5 fractions to the surgical incision.  Beams/energy:  6X, 15X // Photon Boost, 6e // Photon  Narrative: The patient tolerated radiation treatment relatively well.   On 02/02/18 patient felt a small palpable area in the axilla which she was concerned with. On physical exam, she had some increasing brighter erythema, most prominently in the upper axilla. No moist desquamation, tightness was present in the upper axilla without any concerning palpable abnormalities. Patient did experience increased irritation in the upper axilla. Patient denied having fatigue, she did endorse some soreness under her arm. Her skin had some hyperpigmentation and the broken areas appeared to be in the healing stages. She has been using radiaplex. On her physical exam for her last treatment visit, she had some dry desquamations present diffusely in the treatment area no moist estimation, the patients incision had held up well during her treatment with no major changes.   Plan: The patient has completed radiation treatment. The patient will return to radiation oncology clinic for routine followup in one month. I advised them to call or return sooner if they have any questions or concerns related to their recovery or treatment.  ------------------------------------------------  Jodelle Gross, MD, PhD   This document serves as a record of services personally performed by Kyung Rudd MD. It was created on his behalf by Delton Coombes, a trained medical scribe. The creation of this record is based on the scribe's personal observations and the provider's statements to them.

## 2018-02-23 ENCOUNTER — Encounter: Payer: Self-pay | Admitting: *Deleted

## 2018-03-06 DIAGNOSIS — H00014 Hordeolum externum left upper eyelid: Secondary | ICD-10-CM | POA: Diagnosis not present

## 2018-03-09 ENCOUNTER — Other Ambulatory Visit: Payer: Self-pay | Admitting: Hematology

## 2018-03-12 ENCOUNTER — Encounter (HOSPITAL_COMMUNITY): Payer: Self-pay | Admitting: Emergency Medicine

## 2018-03-12 ENCOUNTER — Other Ambulatory Visit: Payer: Self-pay

## 2018-03-12 DIAGNOSIS — H5712 Ocular pain, left eye: Secondary | ICD-10-CM | POA: Diagnosis not present

## 2018-03-12 DIAGNOSIS — Z5321 Procedure and treatment not carried out due to patient leaving prior to being seen by health care provider: Secondary | ICD-10-CM | POA: Insufficient documentation

## 2018-03-12 MED ORDER — TETRACAINE HCL 0.5 % OP SOLN: 2.0000 [drp] | Freq: Once | OPHTHALMIC | Status: DC

## 2018-03-12 MED ORDER — FLUORESCEIN SODIUM 1 MG OP STRP: 1.0000 | ORAL_STRIP | Freq: Once | OPHTHALMIC | Status: DC

## 2018-03-12 NOTE — ED Triage Notes (Signed)
Pt reports having pain in left eye for the last few days. Pt uncertain if it may be a corneal scratch or a sty.

## 2018-03-13 ENCOUNTER — Emergency Department (HOSPITAL_COMMUNITY)
Admission: EM | Admit: 2018-03-13 | Discharge: 2018-03-13 | Disposition: A | Discharge status: Home / Self Care | Payer: BLUE CROSS/BLUE SHIELD | Attending: Emergency Medicine | Admitting: Emergency Medicine

## 2018-03-13 DIAGNOSIS — B0052 Herpesviral keratitis: Secondary | ICD-10-CM | POA: Diagnosis not present

## 2018-03-13 NOTE — ED Notes (Signed)
Follow up call made  No answer  03/13/18 1125  s Vinicio Lynk rn

## 2018-03-21 ENCOUNTER — Ambulatory Visit: Payer: Self-pay | Admitting: Radiation Oncology

## 2018-03-27 ENCOUNTER — Ambulatory Visit: Payer: BLUE CROSS/BLUE SHIELD | Attending: General Surgery

## 2018-03-27 DIAGNOSIS — C50912 Malignant neoplasm of unspecified site of left female breast: Secondary | ICD-10-CM | POA: Diagnosis not present

## 2018-03-27 DIAGNOSIS — R293 Abnormal posture: Secondary | ICD-10-CM | POA: Diagnosis not present

## 2018-03-27 DIAGNOSIS — M25512 Pain in left shoulder: Secondary | ICD-10-CM | POA: Insufficient documentation

## 2018-03-27 DIAGNOSIS — M25611 Stiffness of right shoulder, not elsewhere classified: Secondary | ICD-10-CM | POA: Diagnosis not present

## 2018-03-27 DIAGNOSIS — M25612 Stiffness of left shoulder, not elsewhere classified: Secondary | ICD-10-CM | POA: Insufficient documentation

## 2018-03-27 DIAGNOSIS — M25511 Pain in right shoulder: Secondary | ICD-10-CM | POA: Diagnosis not present

## 2018-03-27 DIAGNOSIS — Z483 Aftercare following surgery for neoplasm: Secondary | ICD-10-CM | POA: Diagnosis not present

## 2018-03-27 DIAGNOSIS — C773 Secondary and unspecified malignant neoplasm of axilla and upper limb lymph nodes: Secondary | ICD-10-CM | POA: Diagnosis not present

## 2018-03-27 NOTE — Therapy (Signed)
[place], [place], [address] Phone: [phone]   Fax:  [phone]  Physical Therapy Treatment  Patient Details  Name: Madison Savage MRN: [id number] Date of Birth: Jan 15, 1971 Referring Provider: Dr. Burr Medico   Encounter Date: 03/27/2018  PT End of Session - 03/27/18 1103    Visit Number  8    Number of Visits  15    Date for PT Re-Evaluation  05/08/18    PT Start Time  1021    PT Stop Time  1104    PT Time Calculation (min)  43 min    Activity Tolerance  Patient tolerated treatment well    Behavior During Therapy  [facility] for tasks assessed/performed       Past Medical History:  Diagnosis Date  . Anemia   . Anxiety   . Cancer ([place]) 03/2017   left breast cancer  . Chronic ITP (idiopathic thrombocytopenia) (HCC)   . Family history of breast cancer   . Family history of ovarian cancer   . Headache    last migraine...9/24  . Heart murmur    at birth.  none now  . MTHFR mutation [facility])     Past Surgical History:  Procedure Laterality Date  . AXILLARY LYMPH NODE DISSECTION Left 04/27/2017   Procedure: AXILLARY LYMPH NODE DISSECTION;  Surgeon: [name], MD;  Location: [place];  Service: General;  Laterality: Left;  . BILATERAL TOTAL MASTECTOMY WITH AXILLARY LYMPH NODE DISSECTION Bilateral 04/27/2017   Procedure: SKIN SPARING MASTECTOMY;  Surgeon: [name], MD;  Location: [place];  Service: General;  Laterality: Bilateral;  . BREAST CAPSULECTOMY WITH IMPLANT EXCHANGE Bilateral 12/06/2017   Procedure: BREAST CAPSULECTOMY WITH IMPLANT EXCHANGE;  Surgeon: Wallace Going, DO;  Location: [place];  Service: Plastics;  Laterality: Bilateral;  . BREAST RECONSTRUCTION WITH PLACEMENT OF TISSUE EXPANDER AND FLEX HD (ACELLULAR HYDRATED DERMIS) Bilateral 04/27/2017   Procedure: BILATERAL BREAST RECONSTRUCTION WITH PLACEMENT OF TISSUE EXPANDER AND  FLEX HD (ACELLULAR HYDRATED DERMIS);  Surgeon: Wallace Going, DO;  Location: [place];  Service: Plastics;  Laterality: Bilateral;  . BREAST SURGERY    . MASTECTOMY    . PLACEMENT OF BREAST IMPLANTS Bilateral 12/06/2017   Procedure: PLACEMENT OF BREAST IMPLANTS;  Surgeon: Wallace Going, DO;  Location: [place];  Service: Plastics;  Laterality: Bilateral;  . PORT-A-CATH REMOVAL Right 12/06/2017   Procedure: REMOVAL PORT-A-CATH;  Surgeon: Wallace Going, DO;  Location: [place];  Service: Plastics;  Laterality: Right;  . PORTACATH PLACEMENT Right 06/01/2017   Procedure: INSERTION PORT-A-CATH RIGHT SUBCLAVIAN;  Surgeon: [name], MD;  Location: [place];  Service: General;  Laterality: Right;  . WISDOM TOOTH EXTRACTION      There were no vitals filed for this visit.  Subjective Assessment - 03/27/18 1026    Subjective  Still pretty tight in Lt axilla but feel like when I use weights it helps. Been exercising well, just want to get rid of the tightness.     Pertinent History  Patient was diagnosed on 03/21/17 with left invasive lobular carcinoma breast cancer. It measures 2.1 cm and is located in the upper outer quadrant. She also has 6 abnormal axillary lymph nodes and a node was biopsied and found to be positive for carcinoma.   04/27/2017 .bilateral mastecomy with ALND ( 21 nodes removed) with immedicate expanders. She is planning to have chemo, then have  implant surgery and hysterectomy and then have radiation     Patient Stated Goals  to get rid of shoulder pain     Currently in Pain?  No/denies         [facility] PT Assessment - 03/27/18 0001      AROM   Right Shoulder Flexion  166 Degrees    Right Shoulder ABduction  176 Degrees    Left Shoulder Flexion  135 Degrees    Left Shoulder ABduction  169 Degrees                   [facility] Adult PT Treatment/Exercise - 03/27/18 0001      Manual Therapy   Myofascial  Release  To Lt axilla at area of pectoralis insertion; 1 cord noted at end of ROM but this diminshed by end of session    Passive ROM  to left shoulder into flexion, abduction and D2 to pts end ROM                Short Term Clinic Goals - 08/31/17 1249      CC Short Term Goal  #1   Title  pt will be knowledgable of lymphedema risk reduction practices     Status  Achieved      CC Short Term Goal  #2   Title  Pt will be independent in postural and shoulder stretching exercises    Status  Achieved       PT Long Term Goals - 03/27/18 1238      PT LONG TERM GOAL #1   Title  Pt will report that the pain and discomfort in left shoulder has decreased by 50%     Baseline  Pain decreased by 80%    Time  8    Period  Weeks    Status  Achieved      PT LONG TERM GOAL #2   Title  Pt will be independent in upgraded HEP for scapular strength and ROM     Baseline  Pt has returned to all previous exercises.    Time  8    Period  Weeks    Status  Achieved      PT LONG TERM GOAL #3   Title  Increase left shoulder AROM flexion to >/= 155 degrees to increase ease with reaching overhead.    Baseline  135 degrees today 03/27/18    Time  6    Period  Weeks    Status  New      PT LONG TERM GOAL #4   Title  Patient will report >/= 50% decrease in axillary tightness.    Time  6    Period  Weeks    Status  New       Plan - 03/27/18 1213    Clinical Impression Statement  Pt returns today after having finished radiation. Her skin is well healed but her A/ROM has decreased significantly (20 degrees) with flexion, so focused on manual therapy today. she is back to doing most of her exercises as she did before surgery though does still notice a slight difference with Lt UE being weaker. She would like to continue therapy at this time to focus on helping her regain her end ROM of Lt UE, especially flexion as she reports noting her axilla "feels different". By end of session she reported noting  great improvement in tightness at axilla and cord no longer palpable.     Rehab Potential  Good    Clinical Impairments Affecting Rehab Potential  21 nodes removed in left axilla     PT Frequency  1x / week    PT Duration  6 weeks    PT Treatment/Interventions  Therapeutic exercise;Patient/family education;ADLs/Self Care Home Management;Taping;DME Instruction;Dry needling;Functional mobility training;Therapeutic activities;Manual lymph drainage;Manual techniques;Passive range of motion    PT Next Visit Plan  Renewal done this visit for 1x/wk for 6 more weeks. Focus on manual therapy (pt is back to her own full exercise program at home) to help pt regain end ROM and decrease axillary tightness.     Consulted and Agree with Plan of Care  Patient       Patient will benefit from skilled therapeutic intervention in order to improve the following deficits and impairments:  Decreased range of motion, Pain, Postural dysfunction, Increased fascial restricitons, Impaired perceived functional ability  Visit Diagnosis: Stiffness of left shoulder, not elsewhere classified  Acute pain of left shoulder  Aftercare following surgery for neoplasm  Abnormal posture  Stiffness of right shoulder, not elsewhere classified  Pain of right shoulder joint on movement  Breast cancer metastasized to axillary lymph node, left [facility])     Problem List Patient Active Problem List   Diagnosis Date Noted  . Port-A-Cath in place 08/18/2017  . Genetic testing 04/17/2017  . BRCA2 gene mutation positive 04/17/2017  . Family history of breast cancer   . Family history of ovarian cancer   . 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 ([place]) 03/29/2017  . Cough 03/10/2014  . Esophagitis 03/10/2014  . Chronic ITP (idiopathic thrombocytopenia) (HCC) 03/10/2014  . ALLERGIC REACTION, ACUTE 07/22/2008    Annia Friendly, PT 03/27/2018, 12:42 PM  [place], [place], [address] Phone: [phone]   Fax:  [phone]  Name: Madison Savage MRN: [id number] Date of Birth: Dec 17, 1970

## 2018-03-28 ENCOUNTER — Encounter: Payer: Self-pay | Admitting: Radiation Oncology

## 2018-03-28 ENCOUNTER — Ambulatory Visit
Admission: RE | Admit: 2018-03-28 | Discharge: 2018-03-28 | Disposition: A | Discharge status: Home / Self Care | Payer: BLUE CROSS/BLUE SHIELD | Source: Ambulatory Visit | Attending: Radiation Oncology | Admitting: Radiation Oncology

## 2018-03-28 VITALS — BP 98/63 | HR 71 | Temp 97.6°F | Resp 18 | Ht 68.0 in | Wt 119.4 lb

## 2018-03-28 DIAGNOSIS — Z170 Estrogen receptor positive status [ER+]: Secondary | ICD-10-CM | POA: Insufficient documentation

## 2018-03-28 DIAGNOSIS — F419 Anxiety disorder, unspecified: Secondary | ICD-10-CM | POA: Diagnosis not present

## 2018-03-28 DIAGNOSIS — Z881 Allergy status to other antibiotic agents status: Secondary | ICD-10-CM | POA: Diagnosis not present

## 2018-03-28 DIAGNOSIS — Z885 Allergy status to narcotic agent status: Secondary | ICD-10-CM | POA: Diagnosis not present

## 2018-03-28 DIAGNOSIS — Z923 Personal history of irradiation: Secondary | ICD-10-CM | POA: Diagnosis not present

## 2018-03-28 DIAGNOSIS — Z880 Allergy status to penicillin: Secondary | ICD-10-CM | POA: Diagnosis not present

## 2018-03-28 DIAGNOSIS — C50912 Malignant neoplasm of unspecified site of left female breast: Secondary | ICD-10-CM | POA: Insufficient documentation

## 2018-03-28 DIAGNOSIS — Z79899 Other long term (current) drug therapy: Secondary | ICD-10-CM | POA: Diagnosis not present

## 2018-03-28 DIAGNOSIS — C50412 Malignant neoplasm of upper-outer quadrant of left female breast: Secondary | ICD-10-CM

## 2018-03-28 NOTE — Addendum Note (Signed)
Encounter addended by: Malena Edman, RN on: 03/28/2018 3:49 PM  Actions taken: Charge Capture section accepted

## 2018-03-28 NOTE — Progress Notes (Signed)
Radiation Oncology         [phone] ________________________________  Name: Madison Savage MRN: [id number]  Date of Service: 03/28/2018  DOB: [birth year]  Post Treatment Note  CC: Dorena Cookey, MD  [name], MD  Diagnosis:   Stage IB, pT1cNaM0, grade 2, ER/PR positive, invasive ductal carcinoma of the left breast.      Interval Since Last Radiation:  5 weeks   01/03/18 - 02/19/18: 1.) 50.4 Gy directed to the Left Chest wall delivered in 28 fractions. 2.) 50.4 Gy directed to the Left SCLV delivered in 28 fractions. 3.) Boost 10 Gy delivered in 5 fractions to the surgical incision.   Narrative:  The patient returns today for routine follow-up. During treatment she did very well with radiotherapy and did not have significant moist desquamation, though she did have some brisk radiation hyperpigmentation and concern for an opening along her mastectomy scar line. Fortunately she was able to complete treatment without delay.                           On review of systems, the patient states she's doing really well. She's been to the beach and reports she's very proactive about using sunscreen over the site of her radiation treatment. She denies any concerns with her skin at this time.   ALLERGIES:  is allergic to clindamycin; penicillins; and codeine.  Meds: Current Outpatient Medications  Medication Sig Dispense Refill  . b complex vitamins capsule Take 1 capsule by mouth daily.    . Multiple Vitamins-Calcium (ONE-A-DAY WOMENS PO) Take 1 tablet by mouth daily.    Marland Kitchen OVER THE COUNTER MEDICATION Take 1 capsule by mouth daily. Biotin/MSM    . ALPRAZolam (XANAX) 0.25 MG tablet Take 1 tablet (0.25 mg total) by mouth at bedtime as needed for anxiety. (Patient not taking: Reported on 03/27/2018) 20 tablet 0  . tamoxifen (NOLVADEX) 20 MG tablet TAKE 1 TABLET BY MOUTH EVERY DAY (Patient not taking: Reported on 03/27/2018) 30 tablet 2   No current facility-administered medications for this  encounter.    Facility-Administered Medications Ordered in Other Encounters  Medication Dose Route Frequency Provider Last Rate Last Dose  . sodium chloride flush (NS) 0.9 % injection 10 mL  10 mL Intravenous PRN [name], MD   10 mL at 07/21/17 1323    Physical Findings:  height is '5\' 8"'$  (1.727 m) and weight is 119 lb 6.4 oz (54.2 kg). Her oral temperature is 97.6 F (36.4 C). Her blood pressure is 98/63 and her pulse is 71. Her respiration is 18 and oxygen saturation is 99%.  Pain Assessment Pain Score: 0-No pain/10 In general this is a well appearing caucasian female in no acute distress. She's alert and oriented x4 and appropriate throughout the examination. Cardiopulmonary assessment is negative for acute distress and she exhibits normal effort. The left chest wall and in situ implant of the breast is assessed and the mastectomy scar line is intact without separation. Mild hyperpigmentation is noted of the left supraclavicular, left axilla, and chest wall. No desquamation is noted.   Lab Findings: Lab Results  Component Value Date   WBC 6.5 12/06/2017   HGB 13.0 12/06/2017   HCT 39.3 12/06/2017   MCV 92.7 12/06/2017   PLT 176 12/06/2017     Radiographic Findings: No results found.  Impression/Plan: 1. Stage IB, pT1cNaM0, grade 2, ER/PR positive, invasive ductal carcinoma of the left breast. The patient has  been doing well since completion of radiotherapy. We discussed that we would be happy to continue to follow her as needed, but she will also continue to follow up with Dr. Burr Medico in medical oncology. She was counseled on skin care as well as measures to avoid sun exposure to this area.  2. Survivorship. We discussed the importance of survivorship evaluation and she is not yet scheduled for this but will be after seeing Dr. Burr Medico. 3. BRCA and risk reduction options. The patient will follow up in September 2019 with Dr. Denman George for discussion regarding plans for hysterectomy/BSO  surgery.     Carola Rhine, PAC

## 2018-04-09 ENCOUNTER — Ambulatory Visit: Payer: BLUE CROSS/BLUE SHIELD

## 2018-04-13 NOTE — Progress Notes (Signed)
[name]  [phone] [phone]  Clinic Follow up Note   Patient Care Team: Dorena Cookey, MD as PCP - General [name], MD as Consulting Physician (General Surgery) [name], MD as Consulting Physician (Hematology) Kyung Rudd, MD as Consulting Physician (Radiation Oncology) Brien Few, MD as Consulting Physician (Obstetrics and Gynecology) Delice Bison Charlestine Massed, NP as Nurse Practitioner (Hematology and Oncology)   Date of Service:  04/16/2018  CHIEF COMPLAINTS:  Follow up left breast cancer   Oncology History   Cancer Staging Breast cancer metastasized to axillary lymph node, left [facility]) Staging form: Breast, AJCC 8th Edition - Clinical stage from 03/22/2017: Stage Unknown (cTX, cN1, cM0, GX, ER: Positive, PR: Positive, HER2: Negative) - Signed by [name], MD on 03/29/2017 - Pathologic stage from 04/27/2017: Stage IB (pT1c, pN2a, cM0, G2, ER: Positive, PR: Positive, HER2: Negative) - Signed by [name], MD on 05/25/2017 _0     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 ([place])   03/21/2017 Mammogram    Diagnostic mammogram bilateral 03/21/17 IMPRESSION: INCOMPLETE - ADDITIONAL IMAGING EVALUATION NEEDED 1. The 0.6 cm oval mass in the right breast at 11 o'clock middle depth is indeterminate. An ultrasound is recommended.  2. The 0.5cm round focal asymmetry in the left breast central to the nipple middle depth is indeterminate. An ultrasound is recommended.  3. Ultrasound of the palpable abnormalities in the axilla and far left lateral breast is recommended.  4. Multiple clusters of pleomorphic calcifications in the left upper outer breast anterior depth spanning a 2.5 cm area are highly suggestive of malignancy. A stereotactic biopsy is recommended.      03/21/2017 Imaging    US Breast bilateral 03/21/17 IMPRESSION: HIGHLY SUGGESTIVE OF MALIGNANCY 1. Six distinct abnormal left axillary nodes, 2 of which  correspond to the palpable lumps felt by the patient. Findings concerning for metastatic adenopathy. Ultrasound guided biopsy of one of these nodes is recommended.  2. Small 5 mm palpable, oval mass in the left breast 3:00 areolar margin is at an intermediate suspicion. Ultrasound guided biopsy is recommended.  3. Isoechoic 8 mm mass in the right breast 10:00 5 cm from the nipple is also an intermediate suspicion for malignancy, and ultrasound guided biopsy is recommended.  4. Stereotactic guided biopsy for the highly suspicious calcifications in the left breast also recommended as detailed in the mammography report.      03/22/2017 Initial Biopsy    Diagnosis 03/22/17 Lymph node, needle/core biopsy, left axillary node -METASTATIC CARCINOMA, SEE COMMENT Microscopic Comment    03/22/2017 Receptors her2    Lymph node biopsy ER 95% positive, strong staining, PR 5% positive, weak staining, HER-2 negative, with HER2/CEP17 ratio 1.23 and copy #3.95.    03/27/2017 Pathology Results    Diagnosis 03/27/17 1. Breast, left, needle core biopsy -FIBROADENOMATOID NODULE WITH CALCIFICATIONS 2.Breast, left, needle core biopsy -DUCTAL CARCINOMA IN SITU, HIGH GRADE -SEE COMMENT  3. Breast, right, needle, core biopsy -FIBROADENOMA -NO MALIGNANCY IDENTIFIED     03/29/2017 Initial Diagnosis    Breast cancer metastasized to axillary lymph node, left ([place])    04/07/2017 Imaging    CT CAP 04/07/17 IMPRESSION: 1. Several prominent left axillary lymph nodes which may correspond to biopsy proven axillary lymph node metastasis. 2. No thoracic adenopathy identified or evidence of distant metastatic disease.    04/07/2017 Imaging    Bone whole body scan 04/07/17 IMPRESSION: Negative for evidence of osseous metastatic disease.    04/17/2017 Genetic  Testing    BRCA2 c.[id number] (p.Glu260Serfs*15) pathogenic mutation identified on the 9 gene STAT panel.  The STAT Breast cancer panel offered by [facility] includes  sequencing and rearrangement analysis for the following 9 genes:  ATM, BRCA1, BRCA2, CDH1, CHEK2, PALB2, PTEN, STK11 and TP53.   The report date is April 17, 2017.  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  The Hereditary Gene Panel offered by [facility] includes sequencing and/or deletion duplication testing of the following 46 genes: APC, ATM, AXIN2, BARD1, BMPR1A, BRCA1, BRCA2, BRIP1, CDH1, CDKN2A (p14ARF), CDKN2A (p16INK4a), CHEK2, CTNNA1, DICER1, EPCAM (Deletion/duplication testing only), GREM1 (promoter region deletion/duplication testing only), KIT, MEN1, MLH1, MSH2, MSH3, MSH6, MUTYH, NBN, NF1, NHTL1, PALB2, PDGFRA, PMS2, POLD1, POLE, PTEN, RAD50, RAD51C, RAD51D, SDHB, SDHC, SDHD, SMAD4, SMARCA4. STK11, TP53, TSC1, TSC2, and VHL.  The following genes were evaluated for sequence changes only: SDHA and HOXB13 c.251G>A variant only.  The report date is April 29, 2017.     04/27/2017 Surgery    Surgey 04/27/17 BILATERAL SKIN SPARING MASTECTOMY and LEFT AXILLARY LYMPH NODE and BILATERAL BREAST RECONSTRUCTION WITH PLACEMENT OF TISSUE EXPANDER AND FLEX HD (ACELLULAR HYDRATED DERMIS) Bilateral BY Dr. Barry Dienes and Dr. Marla Roe     04/27/2017 Pathology Results     Diagnosis  04/27/17 1. Breast, simple mastectomy, Right - FIBROCYSTIC CHANGES WITH ADENOSIS. - USUAL DUCTAL HYPERPLASIA. - HEALING BIOPSY SITE. - THERE IS NO EVIDENCE OF MALIGNANCY. 2. Breast, simple mastectomy, Left - INVASIVE DUCTAL CARCINOMA, GRADE II/III, SPANNING 1.2 CM. - DUCTAL CARCINOMA IN SITU WITH CALCIFICATIONS, HIGH GRADE. - INVASIVE DUCTAL CARCINOMA IS FOCALLY PRESENT AT THE ANTERIOR MARGIN. - DUCTAL CARCINOMA IN SITU IS BROADLY 0.1 CM TO THE POSTERIOR MARGIN. - SEE ONCOLOGY TABLE BELOW. 3. Lymph nodes, regional resection, Left axillary - METASTATIC CARCINOMA IN 8 OF 21 LYMPH NODES (8/21). - SEE COMMENT     06/09/2017 - 11/16/2017 Chemotherapy    Adriamycin and Cytoxan every 2 weeks, for 4 cycles 06/09/17-07/21/17, followed by weekly Abraxane and carboplatin for 12 weeks starting 08/11/17. Held Carbo with cycle 2 due to poor toleration. Changed to single agent Abraxane for Cycle 3 on 09/08/17. Completed on 11/16/17 with reduced Abraxane due to body aches.     12/06/2017 Surgery    BREAST CAPSULECTOMY WITH IMPLANT EXCHANGE and PLACEMENT OF BREAST IMPLANTS and REMOVAL PORT-A-CATH  by Dr. Marla Roe on 12/06/17    12/18/2017 Survivorship    Survivorship clinic with NP Mendel Ryder     01/03/2018 - 02/19/2018 Radiation Therapy    Radiation therapy 01/03/18 - 02/19/18.    03/2018 -  Anti-estrogen oral therapy    Tamoxifen in 03/2018 --then Anastrozole after TAH/BSO       HISTORY OF PRESENTING ILLNESS: 03/29/17 Madison Savage 47 y.o. female is here because of newly diagnosed Breast cancer metastasized to axillary lymph node, left. She presents to Breast Clinic today with her husband. She felt the lump herself June 22nd. It was soft and "squishy" and it moved. First in the left breast and then four days latter in the left axilla. She feels they have gotten smaller.   In the past she was diagnosed with being ITP positive. Period has been irregular more since her last miscarriage in 2015. She has MTHFR gene. In pregnancy her plt counts have gone low. She has not seen homologist for this. All 3 children were vaginal births.  Today she reports that with the lumps she feels a dull  ache that comes and goes. She has not noticed any nipple or skin change. She had not had a mammogram since she was 69. Her PCP was going to set her up with a mammogram last year. Dr. Ronita Hipps is her OB and Dr. Sherren Mocha is her current PCP. She feels no change in appetite or weight loss. She quit smoking 3 weeks ago. She works out Scientist, product/process development. She reports her last attempt at the MRI, the contrast made her feel like she could not breathe, her head was pounding  and she felt hot all over and she was shaking. The trouble breathing only lasted while she was in the MRI.  Husband shared concern with insurance and financial. And referrals to second opinions.    GYN HISTORY  Menarchal: 16 LMP: 03/05/2017 Contraceptive: none HRT: N/A G7P3: - 3 miscarriages, 1 abortion    CURRENT THERAPY:  Tamoxifen 64m daily starting 03/2018    INTERVAL HISTORY:  NLANITA STAMMENis here for a follow up for her left breast cancer. She presents to the clinic today by herself. She notes she enjoyed her vacation during the summer. She notes her hot flashes have improved but her insomnia has gotten worse along with knee aches later in the day. She notes her eyelashes are starting to thin. She will try taking Tamoxifen in the morning. She note she had a panic attacks recently about her son going to college and adjusting of her body post treatment.      MEDICAL HISTORY:  Past Medical History:  Diagnosis Date  . Anemia   . Anxiety   . Cancer ([facility] 03/2017   left breast cancer  . Chronic ITP (idiopathic thrombocytopenia) ([facility])   . Family history of breast cancer   . Family history of ovarian cancer   . Headache    last migraine...9/24  . Heart murmur    at birth.  none now  . MTHFR mutation ([facility]     SURGICAL HISTORY: Past Surgical History:  Procedure Laterality Date  . AXILLARY LYMPH NODE DISSECTION Left 04/27/2017   Procedure: AXILLARY LYMPH NODE DISSECTION;  Surgeon: BStark Klein MD;  [place] [facility]  Service: General;  Laterality: Left;  . BILATERAL TOTAL MASTECTOMY WITH AXILLARY LYMPH NODE DISSECTION Bilateral 04/27/2017   Procedure: SKIN SPARING MASTECTOMY;  Surgeon: BStark Klein MD;  [place] [facility]  Service: General;  Laterality: Bilateral;  . BREAST CAPSULECTOMY WITH IMPLANT EXCHANGE Bilateral 12/06/2017   Procedure: BREAST CAPSULECTOMY WITH IMPLANT EXCHANGE;  Surgeon: DWallace Going DO;  [place]  [facility]  Service: Plastics;  Laterality: Bilateral;  . BREAST RECONSTRUCTION WITH PLACEMENT OF TISSUE EXPANDER AND FLEX HD (ACELLULAR HYDRATED DERMIS) Bilateral 04/27/2017   Procedure: BILATERAL BREAST RECONSTRUCTION WITH PLACEMENT OF TISSUE EXPANDER AND FLEX HD (ACELLULAR HYDRATED DERMIS);  Surgeon: DWallace Going DO;  [place] [facility]  Service: Plastics;  Laterality: Bilateral;  . BREAST SURGERY    . MASTECTOMY    . PLACEMENT OF BREAST IMPLANTS Bilateral 12/06/2017   Procedure: PLACEMENT OF BREAST IMPLANTS;  Surgeon: DWallace Going DO;  [place] [facility]  Service: Plastics;  Laterality: Bilateral;  . PORT-A-CATH REMOVAL Right 12/06/2017   Procedure: REMOVAL PORT-A-CATH;  Surgeon: DWallace Going DO;  [place] [facility]  Service: Plastics;  Laterality: Right;  . PORTACATH PLACEMENT Right 06/01/2017   Procedure: INSERTION PORT-A-CATH RIGHT SUBCLAVIAN;  Surgeon: BStark Klein MD;  [place] [facility]  Service: General;  Laterality:  Right;  Marland Kitchen WISDOM TOOTH EXTRACTION      SOCIAL HISTORY: Social History   Socioeconomic History  . Marital status: Married    Spouse name: Not on file  . Number of children: 3  . Years of education: Not on file  . Highest education level: Not on file  Occupational History    Employer: UNEMPLOYED  Social Needs  . Financial resource strain: Not on file  . Food insecurity:    Worry: Not on file    Inability: Not on file  . Transportation needs:    Medical: Not on file    Non-medical: Not on file  Tobacco Use  . Smoking status: Former Smoker    Packs/day: 0.50    Years: 20.00    Pack years: 10.00    Types: Cigarettes    Last attempt to quit: 03/05/2017    Years since quitting: 1.1  . Smokeless tobacco: Never Used  Substance and Sexual Activity  . Alcohol use: No  . Drug use: No  . Sexual activity: Yes    Partners: Male    Birth control/protection: None  Lifestyle    . Physical activity:    Days per week: Not on file    Minutes per session: Not on file  . Stress: Not on file  Relationships  . Social connections:    Talks on phone: Not on file    Gets together: Not on file    Attends religious service: Not on file    Active member of club or organization: Not on file    Attends meetings of clubs or organizations: Not on file    Relationship status: Not on file  . Intimate partner violence:    Fear of current or ex partner: Not on file    Emotionally abused: Not on file    Physically abused: Not on file    Forced sexual activity: Not on file  Other Topics Concern  . Not on file  Social History Narrative   Stay at home mom   3 children ages 57, 76, 58    FAMILY HISTORY: Family History  Problem Relation Age of Onset  . Breast cancer Paternal Aunt 30  . Ovarian cancer Paternal Aunt 37  . Cancer Paternal Grandmother        breast cancer   . Cancer Maternal Aunt        lymphoma   . Cancer Paternal Grandfather        throat cancer   . Cancer Paternal Aunt        lung cancer  . Cirrhosis Father   . AAA (abdominal aortic aneurysm) Maternal Grandfather   . Leukemia Paternal Aunt     ALLERGIES:  is allergic to clindamycin; penicillins; and codeine.  MEDICATIONS:  Current Outpatient Medications  Medication Sig Dispense Refill  . ALPRAZolam (XANAX) 0.25 MG tablet Take 1 tablet (0.25 mg total) by mouth at bedtime as needed for anxiety. 20 tablet 0  . b complex vitamins capsule Take 1 capsule by mouth daily.    . Multiple Vitamins-Calcium (ONE-A-DAY WOMENS PO) Take 1 tablet by mouth daily.    Marland Kitchen OVER THE COUNTER MEDICATION Take 1 capsule by mouth daily. Biotin/MSM    . tamoxifen (NOLVADEX) 20 MG tablet TAKE 1 TABLET BY MOUTH EVERY DAY 30 tablet 2   No current facility-administered medications for this visit.    Facility-Administered Medications Ordered in Other Visits  Medication Dose Route Frequency Provider Last Rate Last Dose  . sodium  chloride flush (NS) 0.9 % injection 10 mL  10 mL Intravenous PRN [name], MD   10 mL at 07/21/17 1323    REVIEW OF SYSTEMS:   Constitutional: Denies fevers, chills or abnormal night sweats (+) insomnia (+) hot flashes, improved Eyes: Denies blurriness of vision, double vision or watery eyes (+) thinning eye lash Ears, nose, mouth, throat, and face: Denies mucositis or sore throat  Respiratory: Denies dyspnea or wheezes   Cardiovascular: Denies palpitation, chest discomfort or lower extremity swelling Gastrointestinal:  Denies nausea, heartburn or change in bowel habits Skin: Denies abnormal skin rashes  Lymphatics: Denies new lymphadenopathy or easy bruising Breast: (+) redness and darkening of skin from radiation, itching on left breast.   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   PHYSICAL EXAMINATION: ECOG PERFORMANCE STATUS: 0 - Asymptomatic  Vitals:   04/16/18 1124  BP: 103/73  Pulse: 68  Resp: 17  Temp: 98.1 F (36.7 C)  SpO2: 99%   Filed Weights   04/16/18 1124  Weight: 119 lb 6.4 oz (54.2 kg)     GENERAL:alert, no distress and comfortable SKIN: skin color, texture, turgor are normal, no rashes or significant lesions   EYES: normal, conjunctiva are pink and non-injected, sclera clear OROPHARYNX:no exudate, no erythema and lips, buccal mucosa, and tongue normal  NECK: supple, thyroid normal size, non-tender, without nodularity LYMPH:  no palpable lymphadenopathy in the cervical, axillary or inguinal LUNGS: clear to auscultation and percussion with normal breathing effort HEART: regular rate & rhythm and no murmurs and no lower extremity edema ABDOMEN:abdomen soft, non-tender and normal bowel  sounds Musculoskeletal:no cyanosis of digits and no clubbing  PSYCH: alert & oriented x 3 with fluent speech NEURO: no focal motor/sensory deficits Breast: (+) Status post bilateral mastectomy and breast reconstruction with breast implants: surgical incisions have healed well. Skin erythema and hyperpigmentation from radiation of left breast and axilla  LABORATORY DATA:  I have reviewed the data as listed CBC Latest Ref Rng & Units 04/16/2018 12/06/2017 11/16/2017  WBC 3.9 - 10.3 K/uL 4.8 6.5 4.0  Hemoglobin 11.6 - 15.9 g/dL 13.9 13.0 12.4  Hematocrit 34.8 - 46.6 % 41.1 39.3 36.8  Platelets 145 - 400 K/uL 199 176 200    CMP Latest Ref Rng & Units 04/16/2018 11/16/2017 11/10/2017  Glucose 70 - 99 mg/dL 95 110 96  BUN 6 - 20 mg/dL _0 Creatinine 0.44 - 1.00 mg/dL 0.97 0.83 0.83  Sodium 135 - 145 mmol/L 140 139 141  Potassium 3.5 - 5.1 mmol/L 4.1 3.9 4.0  Chloride 98 - 111 mmol/L 105 108 109  CO2 22 - 32 mmol/L _1 Calcium 8.9 - 10.3 mg/dL 9.3 9.0 9.3  Total Protein 6.5 - 8.1 g/dL 6.6 6.0(L) 6.0(L)  Total Bilirubin 0.3 - 1.2 mg/dL 0.4 0.2 0.2  Alkaline Phos 38 - 126 U/L 85 61 64  AST 15 - 41 U/L _2 ALT 0 - 44 U/L _3 PATHOLOGY   Diagnosis  04/27/17 1. Breast, simple mastectomy, Right - FIBROCYSTIC CHANGES WITH ADENOSIS. - USUAL DUCTAL HYPERPLASIA. - HEALING BIOPSY SITE. - THERE IS NO EVIDENCE OF MALIGNANCY. 2. Breast, simple mastectomy, Left - INVASIVE DUCTAL CARCINOMA, GRADE II/III, SPANNING 1.2 CM. -  DUCTAL CARCINOMA IN SITU WITH CALCIFICATIONS, HIGH GRADE. - INVASIVE DUCTAL CARCINOMA IS FOCALLY PRESENT AT THE ANTERIOR MARGIN. - DUCTAL CARCINOMA IN SITU IS BROADLY 0.1 CM TO THE POSTERIOR MARGIN. - SEE ONCOLOGY TABLE BELOW. 3. Lymph nodes, regional resection, Left axillary - METASTATIC CARCINOMA IN 8 OF 21 LYMPH NODES (8/21). - SEE COMMENT. Microscopic Comment 2. BREAST, INVASIVE TUMOR Procedure: Simple mastectomy and axillary lymph node  resections. Laterality: Left. Tumor Size: 1.2 cm (glass slide measurement). Histologic Type: Ductal Grade: II Tubular Differentiation: 3 Nuclear Pleomorphism: 3 Mitotic Count: 1 Ductal Carcinoma in Situ (DCIS): Present, high grade, extensive. Extent of Tumor: Confined to breast parenchyma. Margins: Invasive carcinoma, distance from closest margin: Focally present at the anterior margin. DCIS, distance from closest margin: Broadly less than 0.1 cm to the posterior margin. Regional Lymph Nodes: Number of Lymph Nodes Examined: 21 1 of 4 FINAL for ARNETTE, DRIGGS [id number]) Microscopic Comment(continued) Lymph Nodes with Macrometastases: 6 Lymph Nodes with Micrometastases: 0 Lymph Nodes with Isolated Tumor Cells: 2 Breast Prognostic Profile: [id number] Estrogen Receptor: 95%, strong. Progesterone Receptor: 5%, strong. Her2: No amplification was detected. The ratio is 1.23. Ki-67: 15%. Best tumor block for sendout testing: 2G Pathologic Stage Classification (pTNM, AJCC 8th Edition): Primary Tumor (pT): pT1c Regional Lymph Nodes (pN): pN2a Distant Metastases (pM): pMX Comments: Grossly, there is a 2.5 cm focus of indurated tissue identified. Histologic examination of this area reveals predominately high grade ductal carcinoma in situ with calcifications. There is associated invasive ductal carcinoma, which is E-cadherin positive, spanning 1.2 cm. (JBK:gt, 05/02/17) 3. Immunohistochemical stains for cytokeratin AE1/AE3 performed on multiple blocks on part 3 help highlight the presence of metastatic carcinoma. (JBK:gt, 05/02/17)   Diagnosis 03/27/17 1. Breast, left, needle core biopsy -FIBROADENOMATOID NODULE WITH CALCIFICATIONS 2.Breast, left, needle core biopsy -DUCTAL CARCINOMA IN SITU, HIGH GRADE -SEE COMMENT  3. Breast, right, needle, core biopsy -FIBROADENOMA -NO MALIGNANCY IDENTIFIED  Diagnosis 03/22/17 Lymph node, needle/core biopsy, left axillary  node -METASTATIC CARCINOMA, SEE COMMENT Microscopic Comment Sections showed lymph node tissue mostly replaced by metastatic carcinoma characterized by sheets of epithelioid appearing cells displaying milder to moderate nuclear pleomorphism, fine chromatin, small nucleoli, and moderately abundant amphophilic to eosinophilic cyto-plasma. Immunohistochemistry staining showed positive for cytokeratin AE1/AE3, GATA-3 and estrogen receptor, with patchy positivity for cytokeratin 7 and GCDFP., Only scattered cells showed progesterone positivity. Negative for S100, melan-a, E-cadherin, cytokeratin 20, CDX-2, TTF-1 or WT-1. The findings are consistent with metastatic carcinoma of breast primary. Lack of significant e-cadherin positivity suggested lobular carcinoma.   GENETICS 04/10/17    PROCEDURES   ECHO 05/31/17 Study Conclusions - Left ventricle: The cavity size was normal. Wall thickness was   normal. Systolic function was normal. The estimated ejection   fraction was in the range of 55% to 60%. Wall motion was normal;   there were no regional wall motion abnormalities. Impressions: - Normal LV systolic and diastolic function; global longitudinal   strain - 17.9%.  RADIOGRAPHIC STUDIES: I have personally reviewed the radiological images as listed and agreed with the findings in the report.  CT CAP 04/07/17 IMPRESSION: 1. Several prominent left axillary lymph nodes which may correspond to biopsy proven axillary lymph node metastasis. 2. No thoracic adenopathy identified or evidence of distant metastatic disease.   Bone whole body scan 04/07/17 IMPRESSION: Negative for evidence of osseous metastatic disease.  Diagnostic mammogram bilateral 03/21/17 IMPRESSION: INCOMPLETE - ADDITIONAL IMAGING EVALUATION NEEDED 1. The 0.6 cm oval mass in the right breast at 11 o'clock middle  depth is indeterminate. An ultrasound is recommended.  2. The 0.5cm round focal asymmetry in the left breast central  to the nipple middle depth is indeterminate. An ultrasound is recommended.  3. Ultrasound of the palpable abnormalities in the axilla and far left lateral breast is recommended.  4. Multiple clusters of pleomorphic calcifications in the left upper outer breast anterior depth spanning a 2.5 cm area are highly suggestive of malignancy. A stereotactic biopsy is recommended.   US Breast bilateral 03/21/17 IMPRESSION: HIGHLY SUGGESTIVE OF MALIGNANCY 1. Six distinct abnormal left axillary nodes, 2 of which correspond to the palpable lumps felt by the patient. Findings concerning for metastatic adenopathy. Ultrasound guided biopsy of one of these nodes is recommended.  2. Small 5 mm palpable, oval mass in the left breast 3:00 areolar margin is at an intermediate suspicion. Ultrasound guided biopsy is recommended.  3. Isoechoic 8 mm mass in the right breast 10:00 5 cm from the nipple is also an intermediate suspicion for malignancy, and ultrasound guided biopsy is recommended.  4. Stereotactic guided biopsy for the highly suspicious calcifications in the left breast also recommended as detailed in the mammography report.    ASSESSMENT & PLAN:  ANGELIS GATES is a 47 y.o. perimenopausal female with a history of Chronic ITP and MTHFR mutation, presented with a palpable left axillary adenopathy.  1. 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 She had a 1.2 cm left breast invasive ductal carcinoma, with 8 positive lymph nodes. Surgical margins were negative. -her staging CT and bone scan was negative -She underwent Adjuvant AC on 06/09/17-07/21/17 followed by Abraxane and Carboplatin 08/11/17-11/16/17 weekly. Due to poor  toleration Carboplatin was held from cycle 2. Pt has been using Cold cap and it worked  -She underwent Breast reconstruction and PAC removal on 12/06/17 -She completed radiation 01/03/18-02/19/18, tolerated well  -She has started adjuvant tamoxifen in June 2019, tolerating well.  I recommend switching tamoxifen to aromatase inhibitor after her BSO.  She is little concerned about more side effects, but open to the change. -I previously discussed her eligibility for a clinical trail named the Pollyann [place] with [place] given her high risk breast cancer. But she was found ineligible due to her positive surgical margins.  -I previously reviewed her breast cancer surveillance as she is high risk for late cancer recurrence.  She does not need annual mammogram, will continue self-exam, routine lab and follow-up. I will consider scan if there is clinical concern for recurrence. -She is clinically doing well. Lab reviewed, her CBC and CMP are within normal limits. Her physical exam was unremarkable. There is no clinical concern for recurrence. -She has been tolerating Tamoxifen well with tolerable hot flashes, hair thinning and insomnia. I suggest OTC melatonin for now. Will continue Tamoxifen -F/u in 3-4 months    2. Chronic ITP  -Discovered in her Last pregnancy, with platelet count dropped to between 50,000 and 60,000, recovered, her platelet counts is around 120-150K. -She does not follow up with hematologist  -Her platelets overall remained stable while on chemotherapy -PLT remain normal    3. 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 -She  has had bilateral mastectomy, due to the high-risk of breast cancer -She plans to have hysterectomy and BSO in Fall of 2019.  -Patient previously met our genetic counselor Mrs. Florene Glen, BRCA2 G mutation screening in her children and siblings were recommended.    4. Insomnia and Anxiety  -I previously recommended her to use Benadryl and 10 mg melatonin OTC as needed.  -I last filled Ativan in October 2018. Her anxiety is mild-moderate, I filled Xanax on 11/03/17. She can take 0.5-1 tablet as needed for anxiety/sleep.  -I previously discussed post-treatment resources to help her adjust to her new normal. She has attended survivorship clinic in 12/2017.  -Her anxiety and overall mood has improved lately. She has tried to attend support groups post treatment.  -She note she has been having panic attacks recently about her son going to college and adjusting of her body post treatment. She continues to take Xanax as needed.    PLAN:  -Continue Tamoxifen  -Lab and f/u in 3-4 months after her BSO, will discuss switching to AI on next visit  No orders of the defined types were placed in this encounter.   All questions were answered. The patient knows to call the clinic with any problems, questions or concerns. I spent 20 minutes counseling the patient face to face. The total time spent in the appointment was 25 minutes and more than 50% was on counseling.  Oneal Deputy, am acting as scribe for [name], MD.   I have reviewed the above documentation for accuracy and completeness, and I agree with the above.      [name], MD 04/16/2018

## 2018-04-16 ENCOUNTER — Encounter: Payer: Self-pay | Admitting: Hematology

## 2018-04-16 ENCOUNTER — Inpatient Hospital Stay (HOSPITAL_BASED_OUTPATIENT_CLINIC_OR_DEPARTMENT_OTHER): Payer: BLUE CROSS/BLUE SHIELD | Admitting: Hematology

## 2018-04-16 ENCOUNTER — Inpatient Hospital Stay: Payer: BLUE CROSS/BLUE SHIELD | Attending: Hematology

## 2018-04-16 VITALS — BP 103/73 | HR 68 | Temp 98.1°F | Resp 17 | Ht 68.0 in | Wt 119.4 lb

## 2018-04-16 DIAGNOSIS — C50912 Malignant neoplasm of unspecified site of left female breast: Secondary | ICD-10-CM

## 2018-04-16 DIAGNOSIS — F410 Panic disorder [episodic paroxysmal anxiety] without agoraphobia: Secondary | ICD-10-CM | POA: Insufficient documentation

## 2018-04-16 DIAGNOSIS — D693 Immune thrombocytopenic purpura: Secondary | ICD-10-CM | POA: Insufficient documentation

## 2018-04-16 DIAGNOSIS — G4700 Insomnia, unspecified: Secondary | ICD-10-CM | POA: Diagnosis not present

## 2018-04-16 DIAGNOSIS — N951 Menopausal and female climacteric states: Secondary | ICD-10-CM

## 2018-04-16 DIAGNOSIS — Z87891 Personal history of nicotine dependence: Secondary | ICD-10-CM | POA: Diagnosis not present

## 2018-04-16 DIAGNOSIS — C773 Secondary and unspecified malignant neoplasm of axilla and upper limb lymph nodes: Secondary | ICD-10-CM

## 2018-04-16 DIAGNOSIS — C50412 Malignant neoplasm of upper-outer quadrant of left female breast: Secondary | ICD-10-CM | POA: Insufficient documentation

## 2018-04-16 LAB — COMPREHENSIVE METABOLIC PANEL
ALT: 13 U/L (ref 0–44)
AST: 15 U/L (ref 15–41)
Albumin: 4.1 g/dL (ref 3.5–5.0)
Alkaline Phosphatase: 85 U/L (ref 38–126)
Anion gap: 10 (ref 5–15)
BUN: 15 mg/dL (ref 6–20)
CO2: 25 mmol/L (ref 22–32)
Calcium: 9.3 mg/dL (ref 8.9–10.3)
Chloride: 105 mmol/L (ref 98–111)
Creatinine, Ser: 0.97 mg/dL (ref 0.44–1.00)
GFR calc Af Amer: >60 mL/min (ref >60)
GFR calc non Af Amer: >60 mL/min (ref >60)
Glucose, Bld: 95 mg/dL (ref 70–99)
Potassium: 4.1 mmol/L (ref 3.5–5.1)
Sodium: 140 mmol/L (ref 135–145)
Total Bilirubin: 0.4 mg/dL (ref 0.3–1.2)
Total Protein: 6.6 g/dL (ref 6.5–8.1)

## 2018-04-16 LAB — CBC WITH DIFFERENTIAL/PLATELET
Basophils Absolute: 0 10*3/uL (ref 0.0–0.1)
Basophils Relative: 0 %
Eosinophils Absolute: 0.1 10*3/uL (ref 0.0–0.5)
Eosinophils Relative: 2 %
HCT: 41.1 % (ref 34.8–46.6)
Hemoglobin: 13.9 g/dL (ref 11.6–15.9)
Lymphocytes Relative: 19 %
Lymphs Abs: 0.9 10*3/uL (ref 0.9–3.3)
MCH: 30.6 pg (ref 25.1–34.0)
MCHC: 33.8 g/dL (ref 31.5–36.0)
MCV: 90.5 fL (ref 79.5–101.0)
Monocytes Absolute: 0.5 10*3/uL (ref 0.1–0.9)
Monocytes Relative: 11 %
Neutro Abs: 3.3 10*3/uL (ref 1.5–6.5)
Neutrophils Relative %: 68 %
Platelets: 199 10*3/uL (ref 145–400)
RBC: 4.54 MIL/uL (ref 3.70–5.45)
RDW: 13.9 % (ref 11.2–14.5)
WBC: 4.8 10*3/uL (ref 3.9–10.3)

## 2018-04-17 ENCOUNTER — Encounter: Payer: Self-pay | Admitting: Hematology

## 2018-04-17 ENCOUNTER — Ambulatory Visit: Payer: BLUE CROSS/BLUE SHIELD | Attending: Hematology

## 2018-04-17 ENCOUNTER — Telehealth: Payer: Self-pay | Admitting: Hematology

## 2018-04-17 DIAGNOSIS — C773 Secondary and unspecified malignant neoplasm of axilla and upper limb lymph nodes: Secondary | ICD-10-CM | POA: Insufficient documentation

## 2018-04-17 DIAGNOSIS — R293 Abnormal posture: Secondary | ICD-10-CM | POA: Diagnosis not present

## 2018-04-17 DIAGNOSIS — M25511 Pain in right shoulder: Secondary | ICD-10-CM | POA: Insufficient documentation

## 2018-04-17 DIAGNOSIS — M25611 Stiffness of right shoulder, not elsewhere classified: Secondary | ICD-10-CM | POA: Diagnosis not present

## 2018-04-17 DIAGNOSIS — Z483 Aftercare following surgery for neoplasm: Secondary | ICD-10-CM | POA: Insufficient documentation

## 2018-04-17 DIAGNOSIS — M25512 Pain in left shoulder: Secondary | ICD-10-CM | POA: Insufficient documentation

## 2018-04-17 DIAGNOSIS — M25612 Stiffness of left shoulder, not elsewhere classified: Secondary | ICD-10-CM

## 2018-04-17 DIAGNOSIS — C50912 Malignant neoplasm of unspecified site of left female breast: Secondary | ICD-10-CM | POA: Diagnosis not present

## 2018-04-17 LAB — CANCER ANTIGEN 27.29: CA 27.29: 6.9 U/mL (ref 0.0–38.6)

## 2018-04-17 NOTE — Therapy (Signed)
[place], [place], [address] Phone: [phone]   Fax:  [phone]  Physical Therapy Treatment  Patient Details  Name: Madison Savage MRN: [id number] Date of Birth: April 06, 1971 Referring Provider: Dr. Burr Medico   Encounter Date: 04/17/2018  PT End of Session - 04/17/18 1624    Visit Number  9    Number of Visits  15    Date for PT Re-Evaluation  05/08/18    PT Start Time  1522    PT Stop Time  1610    PT Time Calculation (min)  48 min    Activity Tolerance  Patient tolerated treatment well    Behavior During Therapy  [facility] for tasks assessed/performed       Past Medical History:  Diagnosis Date  . Anemia   . Anxiety   . Cancer ([place]) 03/2017   left breast cancer  . Chronic ITP (idiopathic thrombocytopenia) (HCC)   . Family history of breast cancer   . Family history of ovarian cancer   . Headache    last migraine...9/24  . Heart murmur    at birth.  none now  . MTHFR mutation [facility])     Past Surgical History:  Procedure Laterality Date  . AXILLARY LYMPH NODE DISSECTION Left 04/27/2017   Procedure: AXILLARY LYMPH NODE DISSECTION;  Surgeon: [name], MD;  Location: [place];  Service: General;  Laterality: Left;  . BILATERAL TOTAL MASTECTOMY WITH AXILLARY LYMPH NODE DISSECTION Bilateral 04/27/2017   Procedure: SKIN SPARING MASTECTOMY;  Surgeon: [name], MD;  Location: [place];  Service: General;  Laterality: Bilateral;  . BREAST CAPSULECTOMY WITH IMPLANT EXCHANGE Bilateral 12/06/2017   Procedure: BREAST CAPSULECTOMY WITH IMPLANT EXCHANGE;  Surgeon: Wallace Going, DO;  Location: [place];  Service: Plastics;  Laterality: Bilateral;  . BREAST RECONSTRUCTION WITH PLACEMENT OF TISSUE EXPANDER AND FLEX HD (ACELLULAR HYDRATED DERMIS) Bilateral 04/27/2017   Procedure: BILATERAL BREAST RECONSTRUCTION WITH PLACEMENT OF TISSUE EXPANDER AND  FLEX HD (ACELLULAR HYDRATED DERMIS);  Surgeon: Wallace Going, DO;  Location: [place];  Service: Plastics;  Laterality: Bilateral;  . BREAST SURGERY    . MASTECTOMY    . PLACEMENT OF BREAST IMPLANTS Bilateral 12/06/2017   Procedure: PLACEMENT OF BREAST IMPLANTS;  Surgeon: Wallace Going, DO;  Location: [place];  Service: Plastics;  Laterality: Bilateral;  . PORT-A-CATH REMOVAL Right 12/06/2017   Procedure: REMOVAL PORT-A-CATH;  Surgeon: Wallace Going, DO;  Location: [place];  Service: Plastics;  Laterality: Right;  . PORTACATH PLACEMENT Right 06/01/2017   Procedure: INSERTION PORT-A-CATH RIGHT SUBCLAVIAN;  Surgeon: [name], MD;  Location: [place];  Service: General;  Laterality: Right;  . WISDOM TOOTH EXTRACTION      There were no vitals filed for this visit.  Subjective Assessment - 04/17/18 1524    Subjective  My Lt pectoralis has been tightening up like crazy on me. I feel like I've been having to do self releases 10x a day and it feel sbetter when I release it but it doesn't stay loose.     Pertinent History  Patient was diagnosed on 03/21/17 with left invasive lobular carcinoma breast cancer. It measures 2.1 cm and is located in the upper outer quadrant. She also has 6 abnormal axillary lymph nodes and a node was biopsied and found to be positive for carcinoma.   04/27/2017 .bilateral mastecomy with ALND ( 21 nodes removed)  with immedicate expanders. She is planning to have chemo, then have implant surgery and hysterectomy and then have radiation     Patient Stated Goals  to get rid of shoulder pain     Currently in Pain?  No/denies                       [facility] Adult PT Treatment/Exercise - 04/17/18 0001      Manual Therapy   Myofascial Release  To Lt axilla at area of pectoralis insertion; 1 cord noted at end of ROM but this diminshed by end of session and pt reported feeling some relief here by end of  session    Scapular Mobilization  In Rt S/L for Lt scapula protraction and retraction with prolonged holds into protraction and myofascial release to medial scapula border    Passive ROM  to left shoulder into flexion, abduction and D2 to pts end ROM                  PT Long Term Goals - 03/27/18 1238      PT LONG TERM GOAL #1   Title  Pt will report that the pain and discomfort in left shoulder has decreased by 50%     Baseline  Pain decreased by 80%    Time  8    Period  Weeks    Status  Achieved      PT LONG TERM GOAL #2   Title  Pt will be independent in upgraded HEP for scapular strength and ROM     Baseline  Pt has returned to all previous exercises.    Time  8    Period  Weeks    Status  Achieved      PT LONG TERM GOAL #3   Title  Increase left shoulder AROM flexion to >/= 155 degrees to increase ease with reaching overhead.    Baseline  135 degrees today 03/27/18    Time  6    Period  Weeks    Status  New      PT LONG TERM GOAL #4   Title  Patient will report >/= 50% decrease in axillary tightness.    Time  6    Period  Weeks    Status  New            Plan - 04/17/18 1624    Clinical Impression Statement  Pt [place] at the beach last 2 weeks so feels like her tightness in Lt axilla increased some. Pt has also reported continuing her daily exercise with and without weights and plank. Spent time discussing with pt the importance of pacing herself with exercises and not overdoing it. Also educated pt that she needs to bemindful of not doing trigger point release/deep tissue for a prolonged time as this can actually increase the swelling and irritation she;s been feeling in her axilla. She reports that she will work on slowing herself down. Also wants to look into getting a compression sleeve for when she works out as I reminded her that this will be beneficial in supporting her lymphatic system when working out and could diminish the cording she's been  feeling very intermittently. Reviewed self myofascial release techniques with pt by stretching in doorway for end ROM or over her foam roll at home for end pectoralis stretch.    Rehab Potential  Good    Clinical Impairments Affecting Rehab Potential  21 nodes removed in left  axilla     PT Frequency  1x / week    PT Duration  6 weeks    PT Treatment/Interventions  Therapeutic exercise;Patient/family education;ADLs/Self Care Home Management;Taping;DME Instruction;Dry needling;Functional mobility training;Therapeutic activities;Manual lymph drainage;Manual techniques;Passive range of motion    PT Next Visit Plan  Focus on manual therapy to help pt regain end ROM and decrease pectoralis and axillary tightness and cording.    Recommended Other Services  Script sent to Dr. Burr Medico for compression sleeve (pt lost initial one and it's been over a year)    Consulted and Agree with Plan of Care  Patient       Patient will benefit from skilled therapeutic intervention in order to improve the following deficits and impairments:  Decreased range of motion, Pain, Postural dysfunction, Increased fascial restricitons, Impaired perceived functional ability  Visit Diagnosis: Stiffness of left shoulder, not elsewhere classified  Acute pain of left shoulder  Aftercare following surgery for neoplasm  Abnormal posture  Stiffness of right shoulder, not elsewhere classified  Pain of right shoulder joint on movement  Breast cancer metastasized to axillary lymph node, left [name])     Problem List Patient Active Problem List   Diagnosis Date Noted  . Port-A-Cath in place 08/18/2017  . Genetic testing 04/17/2017  . BRCA2 gene mutation positive 04/17/2017  . Family history of breast cancer   . Family history of ovarian cancer   . 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 ([place]) 03/29/2017  . Cough 03/10/2014  . Esophagitis 03/10/2014  . Chronic ITP (idiopathic thrombocytopenia)  (HCC) 03/10/2014  . ALLERGIC REACTION, ACUTE 07/22/2008    Otelia Limes, PTA 04/17/2018, 4:41 PM  [place], [place], [address] Phone: [phone]   Fax:  [phone]  Name: KORTNE ALL MRN: [id number] Date of Birth: 21-Jan-1971

## 2018-04-17 NOTE — Telephone Encounter (Signed)
Appts scheduled letter/calendar mailed and VM left also per 8/12 los

## 2018-04-18 ENCOUNTER — Telehealth: Payer: Self-pay

## 2018-04-18 NOTE — Telephone Encounter (Signed)
Faxed signed Cancer Rehab Referral form to [phone]

## 2018-04-18 NOTE — Telephone Encounter (Signed)
Left voice message for patient that tumor marker is normal, no concerns, per Dr. Burr Medico.

## 2018-04-18 NOTE — Telephone Encounter (Signed)
-----   Message from [name], MD sent at 04/18/2018  8:07 AM EDT ----- Please let her know the tumor marker CA27.29 is WNL, no concerns, thanks   [name]  04/18/2018

## 2018-04-27 ENCOUNTER — Encounter: Payer: Self-pay | Admitting: Physical Therapy

## 2018-04-27 ENCOUNTER — Ambulatory Visit: Payer: BLUE CROSS/BLUE SHIELD | Admitting: Physical Therapy

## 2018-04-27 DIAGNOSIS — R293 Abnormal posture: Secondary | ICD-10-CM

## 2018-04-27 DIAGNOSIS — M25512 Pain in left shoulder: Secondary | ICD-10-CM | POA: Diagnosis not present

## 2018-04-27 DIAGNOSIS — M25612 Stiffness of left shoulder, not elsewhere classified: Secondary | ICD-10-CM | POA: Diagnosis not present

## 2018-04-27 DIAGNOSIS — M25511 Pain in right shoulder: Secondary | ICD-10-CM | POA: Diagnosis not present

## 2018-04-27 DIAGNOSIS — M25611 Stiffness of right shoulder, not elsewhere classified: Secondary | ICD-10-CM | POA: Diagnosis not present

## 2018-04-27 DIAGNOSIS — C50912 Malignant neoplasm of unspecified site of left female breast: Secondary | ICD-10-CM | POA: Diagnosis not present

## 2018-04-27 DIAGNOSIS — C773 Secondary and unspecified malignant neoplasm of axilla and upper limb lymph nodes: Secondary | ICD-10-CM | POA: Diagnosis not present

## 2018-04-27 DIAGNOSIS — Z483 Aftercare following surgery for neoplasm: Secondary | ICD-10-CM | POA: Diagnosis not present

## 2018-04-27 NOTE — Therapy (Signed)
[place], [place], [address] Phone: [phone]   Fax:  [phone]  Physical Therapy Treatment  Patient Details  Name: Madison Savage MRN: [id number] Date of Birth: 1970/10/03 Referring Provider: Dr. Burr Medico   Encounter Date: 04/27/2018  PT End of Session - 04/27/18 1158    Visit Number  10    Number of Visits  15    Date for PT Re-Evaluation  05/08/18    PT Start Time  0845    PT Stop Time  0935    PT Time Calculation (min)  50 min    Activity Tolerance  Patient tolerated treatment well    Behavior During Therapy  [facility] for tasks assessed/performed       Past Medical History:  Diagnosis Date  . Anemia   . Anxiety   . Cancer ([place]) 03/2017   left breast cancer  . Chronic ITP (idiopathic thrombocytopenia) (HCC)   . Family history of breast cancer   . Family history of ovarian cancer   . Headache    last migraine...9/24  . Heart murmur    at birth.  none now  . MTHFR mutation [facility])     Past Surgical History:  Procedure Laterality Date  . AXILLARY LYMPH NODE DISSECTION Left 04/27/2017   Procedure: AXILLARY LYMPH NODE DISSECTION;  Surgeon: [name], MD;  Location: [place];  Service: General;  Laterality: Left;  . BILATERAL TOTAL MASTECTOMY WITH AXILLARY LYMPH NODE DISSECTION Bilateral 04/27/2017   Procedure: SKIN SPARING MASTECTOMY;  Surgeon: [name], MD;  Location: [place];  Service: General;  Laterality: Bilateral;  . BREAST CAPSULECTOMY WITH IMPLANT EXCHANGE Bilateral 12/06/2017   Procedure: BREAST CAPSULECTOMY WITH IMPLANT EXCHANGE;  Surgeon: Wallace Going, DO;  Location: [place];  Service: Plastics;  Laterality: Bilateral;  . BREAST RECONSTRUCTION WITH PLACEMENT OF TISSUE EXPANDER AND FLEX HD (ACELLULAR HYDRATED DERMIS) Bilateral 04/27/2017   Procedure: BILATERAL BREAST RECONSTRUCTION WITH PLACEMENT OF TISSUE EXPANDER AND  FLEX HD (ACELLULAR HYDRATED DERMIS);  Surgeon: Wallace Going, DO;  Location: [place];  Service: Plastics;  Laterality: Bilateral;  . BREAST SURGERY    . MASTECTOMY    . PLACEMENT OF BREAST IMPLANTS Bilateral 12/06/2017   Procedure: PLACEMENT OF BREAST IMPLANTS;  Surgeon: Wallace Going, DO;  Location: [place];  Service: Plastics;  Laterality: Bilateral;  . PORT-A-CATH REMOVAL Right 12/06/2017   Procedure: REMOVAL PORT-A-CATH;  Surgeon: Wallace Going, DO;  Location: [place];  Service: Plastics;  Laterality: Right;  . PORTACATH PLACEMENT Right 06/01/2017   Procedure: INSERTION PORT-A-CATH RIGHT SUBCLAVIAN;  Surgeon: [name], MD;  Location: [place];  Service: General;  Laterality: Right;  . WISDOM TOOTH EXTRACTION      There were no vitals filed for this visit.  Subjective Assessment - 04/27/18 1154    Subjective  Pt states she has she has had some intermittent tightness in her left shoulder .  "It feels like it has been "locking up" on me    Pertinent History  Patient was diagnosed on 03/21/17 with left invasive lobular carcinoma breast cancer. It measures 2.1 cm and is located in the upper outer quadrant. She also has 6 abnormal axillary lymph nodes and a node was biopsied and found to be positive for carcinoma.   04/27/2017 .bilateral mastecomy with ALND ( 21 nodes removed) with immedicate expanders. She is planning to have chemo, then have implant  surgery and hysterectomy and then have radiation     Patient Stated Goals  to get rid of shoulder pain     Currently in Pain?  No/denies                       [facility] Adult PT Treatment/Exercise - 04/27/18 0001      Lumbar Exercises: Prone   Other Prone Lumbar Exercises  'low cobra" yoga pose to for increased thoracic mobiltiy      Shoulder Exercises: Supine   Other Supine Exercises  tried lying over foam roller horizontally at thoracic spine, but it was not  effective in increasing thoracic mobility       Shoulder Exercises: Seated   Other Seated Exercises  "heart to the sun"  with both hands behind hips and opening up front of chest       Manual Therapy   Myofascial Release  To Lt axilla at area of pectoralis insertion; 1 cord noted at end of ROM but this diminshed by end of session and pt reported feeling some relief here by end of session    Scapular Mobilization  In Rt S/L for Lt scapula protraction and retraction with prolonged holds into protraction and myofascial release to medial scapula border    Passive ROM  to left shoulder into flexion, abduction and D2 to pts end ROM                  PT Long Term Goals - 03/27/18 1238      PT LONG TERM GOAL #1   Title  Pt will report that the pain and discomfort in left shoulder has decreased by 50%     Baseline  Pain decreased by 80%    Time  8    Period  Weeks    Status  Achieved      PT LONG TERM GOAL #2   Title  Pt will be independent in upgraded HEP for scapular strength and ROM     Baseline  Pt has returned to all previous exercises.    Time  8    Period  Weeks    Status  Achieved      PT LONG TERM GOAL #3   Title  Increase left shoulder AROM flexion to >/= 155 degrees to increase ease with reaching overhead.    Baseline  135 degrees today 03/27/18    Time  6    Period  Weeks    Status  New      PT LONG TERM GOAL #4   Title  Patient will report >/= 50% decrease in axillary tightness.    Time  6    Period  Weeks    Status  New            Plan - 04/27/18 1159    Clinical Impression Statement  Pt continues with forward shoulders and increased thoracic kyphosis,  Worked today on stretching and exercises in several positions to increase mobility and reposition shoulder more posterior for better posture     Clinical Impairments Affecting Rehab Potential  21 nodes removed in left axilla     PT Treatment/Interventions  Therapeutic exercise;Patient/family  education;ADLs/Self Care Home Management;Taping;DME Instruction;Dry needling;Functional mobility training;Therapeutic activities;Manual lymph drainage;Manual techniques;Passive range of motion    PT Next Visit Plan  Focus on manual therapy to help pt regain end ROM and decrease pectoralis and axillary tightness and cording.  anterior chest stretching and shoulder extension stretches  with dowel        Patient will benefit from skilled therapeutic intervention in order to improve the following deficits and impairments:     Visit Diagnosis: Stiffness of left shoulder, not elsewhere classified  Acute pain of left shoulder  Aftercare following surgery for neoplasm  Abnormal posture     Problem List Patient Active Problem List   Diagnosis Date Noted  . Port-A-Cath in place 08/18/2017  . Genetic testing 04/17/2017  . BRCA2 gene mutation positive 04/17/2017  . Family history of breast cancer   . Family history of ovarian cancer   . 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 ([place]) 03/29/2017  . Cough 03/10/2014  . Esophagitis 03/10/2014  . Chronic ITP (idiopathic thrombocytopenia) (HCC) 03/10/2014  . ALLERGIC REACTION, ACUTE 07/22/2008   Donato Heinz. Owens Shark PT  Norwood Levo 04/27/2018, 12:01 PM  [place], [place], [address] Phone: [phone]   Fax:  [phone]  Name: ZILPHA MCANDREW MRN: [id number] Date of Birth: 12-15-1970

## 2018-04-30 ENCOUNTER — Ambulatory Visit: Payer: BLUE CROSS/BLUE SHIELD

## 2018-04-30 DIAGNOSIS — C50912 Malignant neoplasm of unspecified site of left female breast: Secondary | ICD-10-CM | POA: Diagnosis not present

## 2018-04-30 DIAGNOSIS — M25612 Stiffness of left shoulder, not elsewhere classified: Secondary | ICD-10-CM

## 2018-04-30 DIAGNOSIS — R293 Abnormal posture: Secondary | ICD-10-CM

## 2018-04-30 DIAGNOSIS — C773 Secondary and unspecified malignant neoplasm of axilla and upper limb lymph nodes: Secondary | ICD-10-CM | POA: Diagnosis not present

## 2018-04-30 DIAGNOSIS — Z483 Aftercare following surgery for neoplasm: Secondary | ICD-10-CM | POA: Diagnosis not present

## 2018-04-30 DIAGNOSIS — M25512 Pain in left shoulder: Secondary | ICD-10-CM

## 2018-04-30 DIAGNOSIS — M25611 Stiffness of right shoulder, not elsewhere classified: Secondary | ICD-10-CM | POA: Diagnosis not present

## 2018-04-30 DIAGNOSIS — M25511 Pain in right shoulder: Secondary | ICD-10-CM | POA: Diagnosis not present

## 2018-04-30 NOTE — Therapy (Signed)
[place], [place], [address] Phone: [phone]   Fax:  [phone]  Physical Therapy Treatment  Patient Details  Name: Madison Savage MRN: [id number] Date of Birth: 1970/12/29 Referring Provider: Dr. Burr Medico   Encounter Date: 04/30/2018  PT End of Session - 04/30/18 1157    Visit Number  11    Number of Visits  15    Date for PT Re-Evaluation  05/08/18    PT Start Time  1106    PT Stop Time  1151    PT Time Calculation (min)  45 min    Activity Tolerance  Patient tolerated treatment well    Behavior During Therapy  [facility] for tasks assessed/performed       Past Medical History:  Diagnosis Date  . Anemia   . Anxiety   . Cancer ([place]) 03/2017   left breast cancer  . Chronic ITP (idiopathic thrombocytopenia) (HCC)   . Family history of breast cancer   . Family history of ovarian cancer   . Headache    last migraine...9/24  . Heart murmur    at birth.  none now  . MTHFR mutation [facility])     Past Surgical History:  Procedure Laterality Date  . AXILLARY LYMPH NODE DISSECTION Left 04/27/2017   Procedure: AXILLARY LYMPH NODE DISSECTION;  Surgeon: [name], MD;  Location: [place];  Service: General;  Laterality: Left;  . BILATERAL TOTAL MASTECTOMY WITH AXILLARY LYMPH NODE DISSECTION Bilateral 04/27/2017   Procedure: SKIN SPARING MASTECTOMY;  Surgeon: [name], MD;  Location: [place];  Service: General;  Laterality: Bilateral;  . BREAST CAPSULECTOMY WITH IMPLANT EXCHANGE Bilateral 12/06/2017   Procedure: BREAST CAPSULECTOMY WITH IMPLANT EXCHANGE;  Surgeon: Wallace Going, DO;  Location: [place];  Service: Plastics;  Laterality: Bilateral;  . BREAST RECONSTRUCTION WITH PLACEMENT OF TISSUE EXPANDER AND FLEX HD (ACELLULAR HYDRATED DERMIS) Bilateral 04/27/2017   Procedure: BILATERAL BREAST RECONSTRUCTION WITH PLACEMENT OF TISSUE EXPANDER AND  FLEX HD (ACELLULAR HYDRATED DERMIS);  Surgeon: Wallace Going, DO;  Location: [place];  Service: Plastics;  Laterality: Bilateral;  . BREAST SURGERY    . MASTECTOMY    . PLACEMENT OF BREAST IMPLANTS Bilateral 12/06/2017   Procedure: PLACEMENT OF BREAST IMPLANTS;  Surgeon: Wallace Going, DO;  Location: [place];  Service: Plastics;  Laterality: Bilateral;  . PORT-A-CATH REMOVAL Right 12/06/2017   Procedure: REMOVAL PORT-A-CATH;  Surgeon: Wallace Going, DO;  Location: [place];  Service: Plastics;  Laterality: Right;  . PORTACATH PLACEMENT Right 06/01/2017   Procedure: INSERTION PORT-A-CATH RIGHT SUBCLAVIAN;  Surgeon: [name], MD;  Location: [place];  Service: General;  Laterality: Right;  . WISDOM TOOTH EXTRACTION      There were no vitals filed for this visit.  Subjective Assessment - 04/30/18 1109    Subjective  My pect is still locking up on me, especially after I work out.     Pertinent History  Patient was diagnosed on 03/21/17 with left invasive lobular carcinoma breast cancer. It measures 2.1 cm and is located in the upper outer quadrant. She also has 6 abnormal axillary lymph nodes and a node was biopsied and found to be positive for carcinoma.   04/27/2017 .bilateral mastecomy with ALND ( 21 nodes removed) with immedicate expanders. She is planning to have chemo, then have implant surgery and hysterectomy and then have radiation     Patient  Stated Goals  to get rid of shoulder pain     Currently in Pain?  No/denies                       [facility] Adult PT Treatment/Exercise - 04/30/18 0001      Manual Therapy   Myofascial Release  To Lt axilla at area of pectoralis insertion; 1 cord conts to be noted at end of ROM but this diminshes by end of session and pt reported feeling some relief here.    Scapular Mobilization  In Rt S/L for Lt scapula protraction and retraction with prolonged holds into  protraction and myofascial release to medial scapula border    Passive ROM  to left shoulder into flexion, abduction and D2 to pts end ROM                  PT Long Term Goals - 03/27/18 1238      PT LONG TERM GOAL #1   Title  Pt will report that the pain and discomfort in left shoulder has decreased by 50%     Baseline  Pain decreased by 80%    Time  8    Period  Weeks    Status  Achieved      PT LONG TERM GOAL #2   Title  Pt will be independent in upgraded HEP for scapular strength and ROM     Baseline  Pt has returned to all previous exercises.    Time  8    Period  Weeks    Status  Achieved      PT LONG TERM GOAL #3   Title  Increase left shoulder AROM flexion to >/= 155 degrees to increase ease with reaching overhead.    Baseline  135 degrees today 03/27/18    Time  6    Period  Weeks    Status  New      PT LONG TERM GOAL #4   Title  Patient will report >/= 50% decrease in axillary tightness.    Time  6    Period  Weeks    Status  New            Plan - 04/30/18 1158    Clinical Impression Statement  Continued with focus on maual therapy of Lt shoulder in varying positions to increase mobility and repositioned shoulder posteriorly throughout session for improved posture and shoulder alignment. Pt reports has backed off on the intensity and weights with some of her exercises and reports this has improved the pect tightness to a degree.     Rehab Potential  Good    Clinical Impairments Affecting Rehab Potential  21 nodes removed in left axilla     PT Frequency  1x / week    PT Duration  6 weeks    PT Treatment/Interventions  Therapeutic exercise;Patient/family education;ADLs/Self Care Home Management;Taping;DME Instruction;Dry needling;Functional mobility training;Therapeutic activities;Manual lymph drainage;Manual techniques;Passive range of motion    PT Next Visit Plan  Reassess goals next visit and remeausre ROM. Renew or D/C next visit. Focus on manual  therapy to help pt regain end ROM and decrease pectoralis and axillary tightness and cording.  anterior chest stretching and shoulder extension stretches with dowel     Consulted and Agree with Plan of Care  Patient       Patient will benefit from skilled therapeutic intervention in order to improve the following deficits and impairments:  Decreased range of motion,  Pain, Postural dysfunction, Increased fascial restricitons, Impaired perceived functional ability  Visit Diagnosis: Stiffness of left shoulder, not elsewhere classified  Acute pain of left shoulder  Aftercare following surgery for neoplasm  Abnormal posture     Problem List Patient Active Problem List   Diagnosis Date Noted  . Port-A-Cath in place 08/18/2017  . Genetic testing 04/17/2017  . BRCA2 gene mutation positive 04/17/2017  . Family history of breast cancer   . Family history of ovarian cancer   . 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 ([place]) 03/29/2017  . Cough 03/10/2014  . Esophagitis 03/10/2014  . Chronic ITP (idiopathic thrombocytopenia) (HCC) 03/10/2014  . ALLERGIC REACTION, ACUTE 07/22/2008    Collie Siad Ann,PTA 04/30/2018, 12:04 PM  [place], [place], [address] Phone: [phone]   Fax:  [phone]  Name: Madison Savage MRN: [id number] Date of Birth: [birth year]

## 2018-05-08 ENCOUNTER — Ambulatory Visit: Payer: BLUE CROSS/BLUE SHIELD | Attending: Hematology | Admitting: Physical Therapy

## 2018-05-08 ENCOUNTER — Encounter: Payer: Self-pay | Admitting: Physical Therapy

## 2018-05-08 DIAGNOSIS — M25612 Stiffness of left shoulder, not elsewhere classified: Secondary | ICD-10-CM | POA: Insufficient documentation

## 2018-05-08 DIAGNOSIS — M25512 Pain in left shoulder: Secondary | ICD-10-CM

## 2018-05-08 DIAGNOSIS — Z483 Aftercare following surgery for neoplasm: Secondary | ICD-10-CM | POA: Diagnosis not present

## 2018-05-08 DIAGNOSIS — R293 Abnormal posture: Secondary | ICD-10-CM | POA: Diagnosis not present

## 2018-05-08 NOTE — Therapy (Signed)
[place], [place], [address] Phone: [phone]   Fax:  [phone]  Physical Therapy Treatment  Patient Details  Name: Madison Savage MRN: [id number] Date of Birth: Dec 14, 1970 Referring Provider: Dr. Burr Medico   Encounter Date: 05/08/2018  PT End of Session - 05/08/18 1228    Visit Number  12    Number of Visits  15    Date for PT Re-Evaluation  05/08/18    PT Start Time  1100    PT Stop Time  1150    PT Time Calculation (min)  50 min    Activity Tolerance  Patient tolerated treatment well    Behavior During Therapy  [facility] for tasks assessed/performed       Past Medical History:  Diagnosis Date  . Anemia   . Anxiety   . Cancer ([place]) 03/2017   left breast cancer  . Chronic ITP (idiopathic thrombocytopenia) (HCC)   . Family history of breast cancer   . Family history of ovarian cancer   . Headache    last migraine...9/24  . Heart murmur    at birth.  none now  . MTHFR mutation [facility])     Past Surgical History:  Procedure Laterality Date  . AXILLARY LYMPH NODE DISSECTION Left 04/27/2017   Procedure: AXILLARY LYMPH NODE DISSECTION;  Surgeon: [name], MD;  Location: [place];  Service: General;  Laterality: Left;  . BILATERAL TOTAL MASTECTOMY WITH AXILLARY LYMPH NODE DISSECTION Bilateral 04/27/2017   Procedure: SKIN SPARING MASTECTOMY;  Surgeon: [name], MD;  Location: [place];  Service: General;  Laterality: Bilateral;  . BREAST CAPSULECTOMY WITH IMPLANT EXCHANGE Bilateral 12/06/2017   Procedure: BREAST CAPSULECTOMY WITH IMPLANT EXCHANGE;  Surgeon: Wallace Going, DO;  Location: [place];  Service: Plastics;  Laterality: Bilateral;  . BREAST RECONSTRUCTION WITH PLACEMENT OF TISSUE EXPANDER AND FLEX HD (ACELLULAR HYDRATED DERMIS) Bilateral 04/27/2017   Procedure: BILATERAL BREAST RECONSTRUCTION WITH PLACEMENT OF TISSUE EXPANDER AND  FLEX HD (ACELLULAR HYDRATED DERMIS);  Surgeon: Wallace Going, DO;  Location: [place];  Service: Plastics;  Laterality: Bilateral;  . BREAST SURGERY    . MASTECTOMY    . PLACEMENT OF BREAST IMPLANTS Bilateral 12/06/2017   Procedure: PLACEMENT OF BREAST IMPLANTS;  Surgeon: Wallace Going, DO;  Location: [place];  Service: Plastics;  Laterality: Bilateral;  . PORT-A-CATH REMOVAL Right 12/06/2017   Procedure: REMOVAL PORT-A-CATH;  Surgeon: Wallace Going, DO;  Location: [place];  Service: Plastics;  Laterality: Right;  . PORTACATH PLACEMENT Right 06/01/2017   Procedure: INSERTION PORT-A-CATH RIGHT SUBCLAVIAN;  Surgeon: [name], MD;  Location: [place];  Service: General;  Laterality: Right;  . WISDOM TOOTH EXTRACTION      There were no vitals filed for this visit.  Subjective Assessment - 05/08/18 1105    Subjective  Pt is feeling tightness in her left anterior shoulder especailly when when she wakes up.      Pertinent History  Patient was diagnosed on 03/21/17 with left invasive lobular carcinoma breast cancer. It measures 2.1 cm and is located in the upper outer quadrant. She also has 6 abnormal axillary lymph nodes and a node was biopsied and found to be positive for carcinoma.   04/27/2017 .bilateral mastecomy with ALND ( 21 nodes removed) with immedicate expanders. She is planning to have chemo, then have implant surgery and hysterectomy and then have radiation  Patient Stated Goals  to get rid of shoulder pain     Currently in Pain?  No/denies   stretching.                       [place] Adult PT Treatment/Exercise - 05/08/18 0001      Lumbar Exercises: Stretches   Other Lumbar Stretch Exercise  back to wall with purple ball at thoracic spine and pt wraps her shoulder blades around the ball to open up front of chest and bring humeral heads back       Lumbar Exercises: Standing   Other Standing Lumbar  Exercises  standing on tennis ball and rolling along foot toes to heel for myofascial release then added stretch with arms overhead pulling on alternaing wrist with lateral stretch       Shoulder Exercises: Prone   Extension  Strengthening;Right;Left;5 reps;Weights    Extension Weight (lbs)  3    Other Prone Exercises  empty can T position 10 reps with no weght, Y position with no weight x 10 reps, pt not able to tolerate I position       Shoulder Exercises: Standing   Other Standing Exercises  "V" up the wall with pinky finget on wall, then up the wall and lift offs, then up the wall one at at time with red theraband       Manual Therapy   Myofascial Release  pulling to left arm in scapular plane with myofascial release to pec major with prolonged pressure to trigger points.     Passive ROM  to left shoulder into flexion, abduction and D2 to pts end ROM                  PT Long Term Goals - 03/27/18 1238      PT LONG TERM GOAL #1   Title  Pt will report that the pain and discomfort in left shoulder has decreased by 50%     Baseline  Pain decreased by 80%    Time  8    Period  Weeks    Status  Achieved      PT LONG TERM GOAL #2   Title  Pt will be independent in upgraded HEP for scapular strength and ROM     Baseline  Pt has returned to all previous exercises.    Time  8    Period  Weeks    Status  Achieved      PT LONG TERM GOAL #3   Title  Increase left shoulder AROM flexion to >/= 155 degrees to increase ease with reaching overhead.    Baseline  135 degrees today 03/27/18    Time  6    Period  Weeks    Status  New      PT LONG TERM GOAL #4   Title  Patient will report >/= 50% decrease in axillary tightness.    Time  6    Period  Weeks    Status  New            Plan - 05/08/18 1228    Clinical Impression Statement  Pt continues to have tightness in her left pec major likely the late effects of radiation.  Noted today is less muscle bulk of left  scapular muscles ( especailly teres major) compared to right.  Focused on exercises to stregthen that today with continued stretching to thoracic spine     Rehab Potential  Good  Clinical Impairments Affecting Rehab Potential  21 nodes removed in left axilla     PT Treatment/Interventions  Therapeutic exercise;Patient/family education;ADLs/Self Care Home Management;Taping;DME Instruction;Dry needling;Functional mobility training;Therapeutic activities;Manual lymph drainage;Manual techniques;Passive range of motion    PT Next Visit Plan  Reassess goals next visit and remeausre ROM. Renew or D/C next visit.  Make sure pt has her compression sleeve for her upcoming flight  Focus on manual therapy to help pt regain end ROM and decrease pectoralis and axillary tightness and cording.  anterior chest stretching and shoulder extension stretches with dowel        Patient will benefit from skilled therapeutic intervention in order to improve the following deficits and impairments:  Decreased range of motion, Pain, Postural dysfunction, Increased fascial restricitons, Impaired perceived functional ability  Visit Diagnosis: Stiffness of left shoulder, not elsewhere classified  Acute pain of left shoulder  Aftercare following surgery for neoplasm  Abnormal posture     Problem List Patient Active Problem List   Diagnosis Date Noted  . Port-A-Cath in place 08/18/2017  . Genetic testing 04/17/2017  . BRCA2 gene mutation positive 04/17/2017  . Family history of breast cancer   . Family history of ovarian cancer   . 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 ([place]) 03/29/2017  . Cough 03/10/2014  . Esophagitis 03/10/2014  . Chronic ITP (idiopathic thrombocytopenia) (HCC) 03/10/2014  . ALLERGIC REACTION, ACUTE 07/22/2008   Donato Heinz. Owens Shark PT  Norwood Levo 05/08/2018, 12:32 PM  [place], [place], [address] Phone: [phone]   Fax:  [phone]  Name: SYDNEI OHAVER MRN: [id number] Date of Birth: 02-Sep-1971

## 2018-05-15 ENCOUNTER — Encounter: Payer: Self-pay | Admitting: Physical Therapy

## 2018-05-15 ENCOUNTER — Ambulatory Visit: Payer: BLUE CROSS/BLUE SHIELD | Admitting: Physical Therapy

## 2018-05-15 DIAGNOSIS — M25612 Stiffness of left shoulder, not elsewhere classified: Secondary | ICD-10-CM | POA: Diagnosis not present

## 2018-05-15 DIAGNOSIS — M25512 Pain in left shoulder: Secondary | ICD-10-CM | POA: Diagnosis not present

## 2018-05-15 DIAGNOSIS — R293 Abnormal posture: Secondary | ICD-10-CM | POA: Diagnosis not present

## 2018-05-15 DIAGNOSIS — Z483 Aftercare following surgery for neoplasm: Secondary | ICD-10-CM

## 2018-05-15 NOTE — Therapy (Signed)
[place], [place], [address] Phone: [phone]   Fax:  [phone]  Physical Therapy Treatment  Patient Details  Name: Madison Savage MRN: [id number] Date of Birth: 05-Apr-1971 Referring Provider: Dr. Burr Medico    Encounter Date: 05/15/2018  PT End of Session - 05/15/18 1212    Visit Number  13    Number of Visits  31    Date for PT Re-Evaluation  07/08/18    PT Start Time  1245    PT Stop Time  1100    PT Time Calculation (min)  45 min    Activity Tolerance  Patient tolerated treatment well    Behavior During Therapy  [facility] for tasks assessed/performed       Past Medical History:  Diagnosis Date  . Anemia   . Anxiety   . Cancer ([place]) 03/2017   left breast cancer  . Chronic ITP (idiopathic thrombocytopenia) (HCC)   . Family history of breast cancer   . Family history of ovarian cancer   . Headache    last migraine...9/24  . Heart murmur    at birth.  none now  . MTHFR mutation [facility])     Past Surgical History:  Procedure Laterality Date  . AXILLARY LYMPH NODE DISSECTION Left 04/27/2017   Procedure: AXILLARY LYMPH NODE DISSECTION;  Surgeon: [name], MD;  Location: [place];  Service: General;  Laterality: Left;  . BILATERAL TOTAL MASTECTOMY WITH AXILLARY LYMPH NODE DISSECTION Bilateral 04/27/2017   Procedure: SKIN SPARING MASTECTOMY;  Surgeon: [name], MD;  Location: [place];  Service: General;  Laterality: Bilateral;  . BREAST CAPSULECTOMY WITH IMPLANT EXCHANGE Bilateral 12/06/2017   Procedure: BREAST CAPSULECTOMY WITH IMPLANT EXCHANGE;  Surgeon: Wallace Going, DO;  Location: [place];  Service: Plastics;  Laterality: Bilateral;  . BREAST RECONSTRUCTION WITH PLACEMENT OF TISSUE EXPANDER AND FLEX HD (ACELLULAR HYDRATED DERMIS) Bilateral 04/27/2017   Procedure: BILATERAL BREAST RECONSTRUCTION WITH PLACEMENT OF TISSUE EXPANDER AND  FLEX HD (ACELLULAR HYDRATED DERMIS);  Surgeon: Wallace Going, DO;  Location: [place];  Service: Plastics;  Laterality: Bilateral;  . BREAST SURGERY    . MASTECTOMY    . PLACEMENT OF BREAST IMPLANTS Bilateral 12/06/2017   Procedure: PLACEMENT OF BREAST IMPLANTS;  Surgeon: Wallace Going, DO;  Location: [place];  Service: Plastics;  Laterality: Bilateral;  . PORT-A-CATH REMOVAL Right 12/06/2017   Procedure: REMOVAL PORT-A-CATH;  Surgeon: Wallace Going, DO;  Location: [place];  Service: Plastics;  Laterality: Right;  . PORTACATH PLACEMENT Right 06/01/2017   Procedure: INSERTION PORT-A-CATH RIGHT SUBCLAVIAN;  Surgeon: [name], MD;  Location: [place];  Service: General;  Laterality: Right;  . WISDOM TOOTH EXTRACTION      There were no vitals filed for this visit.  Subjective Assessment - 05/15/18 1024    Subjective  Pt says that she is getting release of her pec when she does the standing  exercises . She has been having some back pain with the prone exercises. She is going to get her sleeve and guantlet at 11:00 today     Pertinent History  Patient was diagnosed on 03/21/17 with left invasive lobular carcinoma breast cancer. It measures 2.1 cm and is located in the upper outer quadrant. She also has 6 abnormal axillary lymph nodes and a node was biopsied and found to be positive for carcinoma.   04/27/2017 .bilateral mastecomy with  ALND ( 21 nodes removed) with immedicate expanders. She is has completed chemo, implant surgery and radiation  She will have a hysterectomy later this year.     Patient Stated Goals  to get rid of shoulder pain     Currently in Pain?  No/denies   continues to have intermittent  tightness in axilla and back         [facility] PT Assessment - 05/15/18 0001      Assessment   Medical Diagnosis  Left breast cancer    Referring Provider  Dr. Burr Medico     Onset Date/Surgical Date  04/27/17      Prior  Function   Level of Independence  Independent      AROM   Left Shoulder Flexion  145 Degrees   approximate                   [facility] Adult PT Treatment/Exercise - 05/15/18 0001      Shoulder Exercises: Prone   Other Prone Exercises  shoulder extension x 15 reps empty can T position 10 reps with no weigh and manual cures to keep full extensiont, Y position with no weight x 10 reps  with modifications to core sitll had pain in right back  pt not able to tolerate I position due pulling at axilla and pain in right back    cues to keep abds engaged and tried pillow under abdominal.     Manual Therapy   Myofascial Release  pulling to left arm in scapular plane with myofascial release to pec major with prolonged pressure to trigger points.     Scapular Mobilization  In Rt S/L for Lt scapula protraction and retraction with prolonged holds into protraction and myofascial release to medial scapula border    Passive ROM  to left shoulder into flexion, abduction and D2 to pts end ROM             PT Education - 05/15/18 1207    Education provided  Yes    Education Details  prone scapulare exercise to do in painfree range     Person(s) Educated  Patient    Methods  Explanation;Demonstration;Other (comment)   medbridge texted to patient    Comprehension  Verbalized understanding;Returned demonstration          PT Long Term Goals - 05/15/18 1245      PT LONG TERM GOAL #1   Title  Pt will report that the pain and discomfort in left shoulder has decreased by 50%     Baseline  Pain decreased by 80%    Status  Achieved      PT LONG TERM GOAL #2   Title  Pt will be independent in upgraded HEP for scapular strength and ROM     Baseline  Pt continues to have decreased strength in right scapular muscles with decreased muscle bulk compared to left side     Time  8    Period  Weeks    Status  On-going      PT LONG TERM GOAL #3   Title  Increase left shoulder AROM flexion to >/=  155 degrees to increase ease with reaching overhead.    Baseline  135 degrees today 03/27/18, ~ 145 on 05/15/2018     Time  8    Period  Weeks    Status  On-going      PT LONG TERM GOAL #4   Title  Patient will report >/=  50% decrease in axillary tightness.    Time  8    Period  Weeks    Status  On-going            Plan - 05/15/18 1216    Clinical Impression Statement  Madison Savage continues to have tightness in her anterior chest, axilla, and right back with mulitple palpable tender trigger points.  She is a avid exerciser and has been working to increase strength around her right scapula as well as improve scapular and thoracic mobility.  Her symptoms are persistent and she is receiving only temporary relief with manual work . Would like to prgress her to a trial of dry needling to these trigger points followed by specific stretching exercise and so a recert is sent today for 8 more weeks, though her weekly frequency will decease to allow for weekly or biweekly check ups at the end of the time period     Rehab Potential  Good    Clinical Impairments Affecting Rehab Potential  21 nodes removed in left axilla     PT Frequency  2x / week    PT Duration  8 weeks    PT Treatment/Interventions  Therapeutic exercise;Patient/family education;ADLs/Self Care Home Management;Taping;DME Instruction;Dry needling;Functional mobility training;Therapeutic activities;Manual lymph drainage;Manual techniques;Passive range of motion;Electrical Stimulation;Other (comment)   dry needling    PT Next Visit Plan  Dry needling to trigger points with stretching, manual work and progression of home exercise as indicated .  check compressio sleeve if needed     Consulted and Agree with Plan of Care  Patient       Patient will benefit from skilled therapeutic intervention in order to improve the following deficits and impairments:  Decreased range of motion, Pain, Postural dysfunction, Increased fascial restricitons,  Impaired perceived functional ability  Visit Diagnosis: Stiffness of left shoulder, not elsewhere classified - Plan: PT plan of care cert/re-cert  Acute pain of left shoulder - Plan: PT plan of care cert/re-cert  Aftercare following surgery for neoplasm - Plan: PT plan of care cert/re-cert  Abnormal posture - Plan: PT plan of care cert/re-cert     Problem List Patient Active Problem List   Diagnosis Date Noted  . Port-A-Cath in place 08/18/2017  . Genetic testing 04/17/2017  . BRCA2 gene mutation positive 04/17/2017  . Family history of breast cancer   . Family history of ovarian cancer   . 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 ([place]) 03/29/2017  . Cough 03/10/2014  . Esophagitis 03/10/2014  . Chronic ITP (idiopathic thrombocytopenia) (HCC) 03/10/2014  . ALLERGIC REACTION, ACUTE 07/22/2008   Donato Heinz. Owens Shark PT  Norwood Levo 05/15/2018, 12:51 PM  [place], [place], [address] Phone: [phone]   Fax:  [phone]  Name: Madison Savage MRN: [id number] Date of Birth: [birth year]

## 2018-05-15 NOTE — Patient Instructions (Addendum)
Access Code: [email or web address]  URL: [email or web address]  Date: 05/15/2018  Prepared by: Maudry Diego   Exercises  Prone Shoulder Extension - 10 reps - 3 sets - 1x daily - 7x weekly  Prone Shoulder Horizontal Abduction - 10 reps - 3 sets - 1x daily - 7x weekly  Prone Single Arm Shoulder Y - 10 reps - 3 sets - 1x daily - 7x weekly  Prone Shoulder Flexion - 10 reps - 3 sets - 1x daily - 7x weekly  Prone W Scapular Retraction - 10 reps - 3 sets - 1x daily - 7x weekly    Do not do "Y" and shoulder flexion if you are having pain in your back

## 2018-05-24 ENCOUNTER — Encounter: Payer: Self-pay | Admitting: Physical Therapy

## 2018-05-24 ENCOUNTER — Ambulatory Visit: Payer: BLUE CROSS/BLUE SHIELD | Admitting: Physical Therapy

## 2018-05-24 DIAGNOSIS — M25612 Stiffness of left shoulder, not elsewhere classified: Secondary | ICD-10-CM | POA: Diagnosis not present

## 2018-05-24 DIAGNOSIS — M25512 Pain in left shoulder: Secondary | ICD-10-CM | POA: Diagnosis not present

## 2018-05-24 DIAGNOSIS — R293 Abnormal posture: Secondary | ICD-10-CM | POA: Diagnosis not present

## 2018-05-24 DIAGNOSIS — Z483 Aftercare following surgery for neoplasm: Secondary | ICD-10-CM | POA: Diagnosis not present

## 2018-05-24 NOTE — Therapy (Signed)
[place], [place], [address] Phone: [phone]   Fax:  [phone]  Physical Therapy Treatment  Patient Details  Name: Madison Savage MRN: [id number] Date of Birth: June 28, 1971 Referring Provider: Dr. Burr Medico    Encounter Date: 05/24/2018  PT End of Session - 05/24/18 1732    Visit Number  14    Number of Visits  31    Date for PT Re-Evaluation  07/08/18    PT Start Time  1633    PT Stop Time  1720    PT Time Calculation (min)  47 min    Activity Tolerance  Patient tolerated treatment well    Behavior During Therapy  [facility] for tasks assessed/performed       Past Medical History:  Diagnosis Date  . Anemia   . Anxiety   . Cancer ([place]) 03/2017   left breast cancer  . Chronic ITP (idiopathic thrombocytopenia) (HCC)   . Family history of breast cancer   . Family history of ovarian cancer   . Headache    last migraine...9/24  . Heart murmur    at birth.  none now  . MTHFR mutation [facility])     Past Surgical History:  Procedure Laterality Date  . AXILLARY LYMPH NODE DISSECTION Left 04/27/2017   Procedure: AXILLARY LYMPH NODE DISSECTION;  Surgeon: [name], MD;  Location: [place];  Service: General;  Laterality: Left;  . BILATERAL TOTAL MASTECTOMY WITH AXILLARY LYMPH NODE DISSECTION Bilateral 04/27/2017   Procedure: SKIN SPARING MASTECTOMY;  Surgeon: [name], MD;  Location: [place];  Service: General;  Laterality: Bilateral;  . BREAST CAPSULECTOMY WITH IMPLANT EXCHANGE Bilateral 12/06/2017   Procedure: BREAST CAPSULECTOMY WITH IMPLANT EXCHANGE;  Surgeon: Wallace Going, DO;  Location: [place];  Service: Plastics;  Laterality: Bilateral;  . BREAST RECONSTRUCTION WITH PLACEMENT OF TISSUE EXPANDER AND FLEX HD (ACELLULAR HYDRATED DERMIS) Bilateral 04/27/2017   Procedure: BILATERAL BREAST RECONSTRUCTION WITH PLACEMENT OF TISSUE EXPANDER AND FLEX  HD (ACELLULAR HYDRATED DERMIS);  Surgeon: Wallace Going, DO;  Location: [place];  Service: Plastics;  Laterality: Bilateral;  . BREAST SURGERY    . MASTECTOMY    . PLACEMENT OF BREAST IMPLANTS Bilateral 12/06/2017   Procedure: PLACEMENT OF BREAST IMPLANTS;  Surgeon: Wallace Going, DO;  Location: [place];  Service: Plastics;  Laterality: Bilateral;  . PORT-A-CATH REMOVAL Right 12/06/2017   Procedure: REMOVAL PORT-A-CATH;  Surgeon: Wallace Going, DO;  Location: [place];  Service: Plastics;  Laterality: Right;  . PORTACATH PLACEMENT Right 06/01/2017   Procedure: INSERTION PORT-A-CATH RIGHT SUBCLAVIAN;  Surgeon: [name], MD;  Location: [place];  Service: General;  Laterality: Right;  . WISDOM TOOTH EXTRACTION      There were no vitals filed for this visit.  Subjective Assessment - 05/24/18 1639    Subjective  "the muscles around the scapulae has still been giving me alot of trouble and hasn't really calmed down"                       [facility] Adult PT Treatment/Exercise - 05/24/18 1729      Shoulder Exercises: Supine   Protraction  15 reps;Strengthening;Left   pertubations x 2 sets     Shoulder Exercises: Seated   Other Seated Exercises  lower trap strengthening with elbows on bolster 2 x 10 with yellow theraband  Shoulder Exercises: Stretch   Other Shoulder Stretches  sleeper stretch 2 x 30 sec       Manual Therapy   Manual therapy comments  skilled palpation and monitoring of pt throughtout TPDN    Joint Mobilization  T1-T8 grade 3 PA, 1st rib grade L only Grade 3    Soft tissue mobilization  IASTM along L upper trap/ levator scapulae and latissmus dorsi       Trigger Point Dry Needling - 05/24/18 1727    Consent Given?  Yes    Education Handout Provided  Yes    Muscles Treated Upper Body  Upper trapezius;Levator scapulae;Rhomboids   latissmus dorsi  L side only   Upper Trapezius  Response  Twitch reponse elicited;Palpable increased muscle length    Levator Scapulae Response  Twitch response elicited;Palpable increased muscle length    Rhomboids Response  Twitch response elicited;Palpable increased muscle length           PT Education - 05/24/18 1731    Education provided  Yes    Education Details  muscle anatomy and referral patterns.  What DN is, what to expect and after care.     Person(s) Educated  Patient    Methods  Explanation;Verbal cues    Comprehension  Verbalized understanding;Verbal cues required          PT Long Term Goals - 05/15/18 1245      PT LONG TERM GOAL #1   Title  Pt will report that the pain and discomfort in left shoulder has decreased by 50%     Baseline  Pain decreased by 80%    Status  Achieved      PT LONG TERM GOAL #2   Title  Pt will be independent in upgraded HEP for scapular strength and ROM     Baseline  Pt continues to have decreased strength in right scapular muscles with decreased muscle bulk compared to left side     Time  8    Period  Weeks    Status  On-going      PT LONG TERM GOAL #3   Title  Increase left shoulder AROM flexion to >/= 155 degrees to increase ease with reaching overhead.    Baseline  135 degrees today 03/27/18, ~ 145 on 05/15/2018     Time  8    Period  Weeks    Status  On-going      PT LONG TERM GOAL #4   Title  Patient will report >/= 50% decrease in axillary tightness.    Time  8    Period  Weeks    Status  On-going            Plan - 05/24/18 1733    Clinical Impression Statement  Manuella reported pain inthe L shoulder. Educated and consent was provided for TPDN of the upper trap/ levator scapulae, latissimus dorsi, and rhomboids followed with IASTM and thoracic/ rib mobs. trialed strengthening to promote scapulo humeral rhythm and stretching to promote behind back reaching. end of session she reported soreness from DNbut significant improvement with reaching overhead without  complication.     PT Treatment/Interventions  Therapeutic exercise;Patient/family education;ADLs/Self Care Home Management;Taping;DME Instruction;Dry needling;Functional mobility training;Therapeutic activities;Manual lymph drainage;Manual techniques;Passive range of motion;Electrical Stimulation;Other (comment)    PT Next Visit Plan  response to DN to trigger points with stretching, manual work and progression of home exercise as indicated .  check compressio sleeve if needed     Consulted  and Agree with Plan of Care  Patient       Patient will benefit from skilled therapeutic intervention in order to improve the following deficits and impairments:  Decreased range of motion, Pain, Postural dysfunction, Increased fascial restricitons, Impaired perceived functional ability  Visit Diagnosis: Stiffness of left shoulder, not elsewhere classified  Acute pain of left shoulder  Abnormal posture     Problem List Patient Active Problem List   Diagnosis Date Noted  . Port-A-Cath in place 08/18/2017  . Genetic testing 04/17/2017  . BRCA2 gene mutation positive 04/17/2017  . Family history of breast cancer   . Family history of ovarian cancer   . 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 ([place]) 03/29/2017  . Cough 03/10/2014  . Esophagitis 03/10/2014  . Chronic ITP (idiopathic thrombocytopenia) (HCC) 03/10/2014  . ALLERGIC REACTION, ACUTE 07/22/2008   Starr Lake PT, DPT, LAT, ATC  05/24/18  5:36 PM      [facility] [address] [place], [place], [address] Phone: [phone]   Fax:  [phone]  Name: Madison Savage MRN: [id number] Date of Birth: Jul 15, 1971

## 2018-05-29 ENCOUNTER — Ambulatory Visit: Payer: BLUE CROSS/BLUE SHIELD | Admitting: Physical Therapy

## 2018-05-29 ENCOUNTER — Encounter: Payer: Self-pay | Admitting: Physical Therapy

## 2018-05-29 DIAGNOSIS — M25512 Pain in left shoulder: Secondary | ICD-10-CM | POA: Diagnosis not present

## 2018-05-29 DIAGNOSIS — R293 Abnormal posture: Secondary | ICD-10-CM

## 2018-05-29 DIAGNOSIS — M25612 Stiffness of left shoulder, not elsewhere classified: Secondary | ICD-10-CM | POA: Diagnosis not present

## 2018-05-29 DIAGNOSIS — Z483 Aftercare following surgery for neoplasm: Secondary | ICD-10-CM | POA: Diagnosis not present

## 2018-05-29 NOTE — Therapy (Signed)
[place], [place], [address] Phone: [phone]   Fax:  [phone]  Physical Therapy Treatment  Patient Details  Name: Madison Savage MRN: [id number] Date of Birth: [birth year] Referring Provider: Dr. Burr Medico    Encounter Date: 05/29/2018  PT End of Session - 05/29/18 1250    Visit Number  15    Number of Visits  31    Date for PT Re-Evaluation  07/08/18    PT Start Time  0156    PT Stop Time  1233    PT Time Calculation (min)  45 min    Activity Tolerance  Patient tolerated treatment well    Behavior During Therapy  [facility] for tasks assessed/performed       Past Medical History:  Diagnosis Date  . Anemia   . Anxiety   . Cancer ([place]) 03/2017   left breast cancer  . Chronic ITP (idiopathic thrombocytopenia) (HCC)   . Family history of breast cancer   . Family history of ovarian cancer   . Headache    last migraine...9/24  . Heart murmur    at birth.  none now  . MTHFR mutation [facility])     Past Surgical History:  Procedure Laterality Date  . AXILLARY LYMPH NODE DISSECTION Left 04/27/2017   Procedure: AXILLARY LYMPH NODE DISSECTION;  Surgeon: [name], MD;  Location: [place];  Service: General;  Laterality: Left;  . BILATERAL TOTAL MASTECTOMY WITH AXILLARY LYMPH NODE DISSECTION Bilateral 04/27/2017   Procedure: SKIN SPARING MASTECTOMY;  Surgeon: [name], MD;  Location: [place];  Service: General;  Laterality: Bilateral;  . BREAST CAPSULECTOMY WITH IMPLANT EXCHANGE Bilateral 12/06/2017   Procedure: BREAST CAPSULECTOMY WITH IMPLANT EXCHANGE;  Surgeon: Wallace Going, DO;  Location: [place];  Service: Plastics;  Laterality: Bilateral;  . BREAST RECONSTRUCTION WITH PLACEMENT OF TISSUE EXPANDER AND FLEX HD (ACELLULAR HYDRATED DERMIS) Bilateral 04/27/2017   Procedure: BILATERAL BREAST RECONSTRUCTION WITH PLACEMENT OF TISSUE EXPANDER AND FLEX  HD (ACELLULAR HYDRATED DERMIS);  Surgeon: Wallace Going, DO;  Location: [place];  Service: Plastics;  Laterality: Bilateral;  . BREAST SURGERY    . MASTECTOMY    . PLACEMENT OF BREAST IMPLANTS Bilateral 12/06/2017   Procedure: PLACEMENT OF BREAST IMPLANTS;  Surgeon: Wallace Going, DO;  Location: [place];  Service: Plastics;  Laterality: Bilateral;  . PORT-A-CATH REMOVAL Right 12/06/2017   Procedure: REMOVAL PORT-A-CATH;  Surgeon: Wallace Going, DO;  Location: [place];  Service: Plastics;  Laterality: Right;  . PORTACATH PLACEMENT Right 06/01/2017   Procedure: INSERTION PORT-A-CATH RIGHT SUBCLAVIAN;  Surgeon: [name], MD;  Location: [place];  Service: General;  Laterality: Right;  . WISDOM TOOTH EXTRACTION      There were no vitals filed for this visit.  Subjective Assessment - 05/29/18 1154    Subjective  "I did feel very sore after the last session, the shoulder is better but the pec is still pretty sore"     Currently in Pain?  No/denies    Pain Orientation  Left                       [facility] Adult PT Treatment/Exercise - 05/29/18 0001      Exercises   Exercises  Shoulder    Other Exercises   with left arm at side, backward thoracic rotation       Shoulder  Exercises: Supine   Horizontal ABduction  Strengthening;Both;10 reps;Theraband    Theraband Level (Shoulder Horizontal ABduction)  Level 2 (Red)    Other Supine Exercises  thoracic extension over bolster with hands behind head and elbows adducted. focusing on breathing in with extension and out with flexion 1 x 15    Other Supine Exercises  bicep 1 x 15 yellow theraband with focus on controlled eccentrics      Shoulder Exercises: ROM/Strengthening   UBE (Upper Arm Bike)  L 1 x 4 min    changing direction at 2 min     Manual Therapy   Manual therapy comments  skilled palpation and monitoring of pt throughtout TPDN    Soft tissue  mobilization  IASTM along L upper trap/ pec major/ and biceps brachi       Trigger Point Dry Needling - 05/29/18 1159    Consent Given?  Yes    Education Handout Provided  No    Muscles Treated Upper Body  Pectoralis major;Pectoralis minor   bicep brachii   Pectoralis Major Response  Twitch response elicited;Palpable increased muscle length    Pectoralis Minor Response  Twitch response elicited;Palpable increased muscle length                PT Long Term Goals - 05/15/18 1245      PT LONG TERM GOAL #1   Title  Pt will report that the pain and discomfort in left shoulder has decreased by 50%     Baseline  Pain decreased by 80%    Status  Achieved      PT LONG TERM GOAL #2   Title  Pt will be independent in upgraded HEP for scapular strength and ROM     Baseline  Pt continues to have decreased strength in right scapular muscles with decreased muscle bulk compared to left side     Time  8    Period  Weeks    Status  On-going      PT LONG TERM GOAL #3   Title  Increase left shoulder AROM flexion to >/= 155 degrees to increase ease with reaching overhead.    Baseline  135 degrees today 03/27/18, ~ 145 on 05/15/2018     Time  8    Period  Weeks    Status  On-going      PT LONG TERM GOAL #4   Title  Patient will report >/= 50% decrease in axillary tightness.    Time  8    Period  Weeks    Status  On-going            Plan - 05/29/18 1250    Clinical Impression Statement  She reported decreased pain / tightness since the last session. continued TPDN for the pec major/ minor and bicep brachii followed with IASTM techniques. continued working on strengthening which she reported improvement of soreness since the last session. she denied modalities end of session.     PT Next Visit Plan  response to DN to trigger points with stretching, manual work and progression of home exercise as indicated .  check compressio sleeve if needed     PT Home Exercise Plan  Post op  shoulder ROM HEP    Consulted and Agree with Plan of Care  Patient       Patient will benefit from skilled therapeutic intervention in order to improve the following deficits and impairments:  Decreased range of motion, Pain, Postural dysfunction, Increased fascial  restricitons, Impaired perceived functional ability  Visit Diagnosis: Stiffness of left shoulder, not elsewhere classified  Acute pain of left shoulder  Abnormal posture     Problem List Patient Active Problem List   Diagnosis Date Noted  . Port-A-Cath in place 08/18/2017  . Genetic testing 04/17/2017  . BRCA2 gene mutation positive 04/17/2017  . Family history of breast cancer   . Family history of ovarian cancer   . 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 Cough 03/10/2014  . Esophagitis 03/10/2014  . Chronic ITP (idiopathic thrombocytopenia) (HCC) 03/10/2014  . ALLERGIC REACTION, ACUTE 07/22/2008   Starr Lake PT, DPT, LAT, ATC  05/29/18  12:53 PM      [facility] [address] [place], [place], [address] Phone: [phone]   Fax:  [phone]  Name: Madison Savage MRN: [id number] Date of Birth: [birth year]

## 2018-06-01 ENCOUNTER — Encounter: Payer: Self-pay | Admitting: Gynecologic Oncology

## 2018-06-01 ENCOUNTER — Inpatient Hospital Stay: Payer: BLUE CROSS/BLUE SHIELD | Attending: Hematology | Admitting: Gynecologic Oncology

## 2018-06-01 VITALS — BP 96/64 | HR 74 | Temp 98.4°F | Resp 18 | Ht 68.0 in | Wt 121.4 lb

## 2018-06-01 DIAGNOSIS — Z1502 Genetic susceptibility to malignant neoplasm of ovary: Secondary | ICD-10-CM | POA: Insufficient documentation

## 2018-06-01 DIAGNOSIS — Z1509 Genetic susceptibility to other malignant neoplasm: Secondary | ICD-10-CM

## 2018-06-01 DIAGNOSIS — Z923 Personal history of irradiation: Secondary | ICD-10-CM

## 2018-06-01 DIAGNOSIS — Z9221 Personal history of antineoplastic chemotherapy: Secondary | ICD-10-CM | POA: Insufficient documentation

## 2018-06-01 DIAGNOSIS — Z1501 Genetic susceptibility to malignant neoplasm of breast: Secondary | ICD-10-CM | POA: Diagnosis not present

## 2018-06-01 DIAGNOSIS — Z853 Personal history of malignant neoplasm of breast: Secondary | ICD-10-CM

## 2018-06-01 NOTE — Progress Notes (Signed)
Follow-up Note: Gyn-Onc  Consult was initially requested by Dr. Burr Medico for the evaluation of Madison Savage 47 y.o. female  CC:  Chief Complaint  Patient presents with  . BRCA2 gene mutation positive    Assessment/Plan:  Madison. Madison Savage  is a 47 y.o.  year old with BRCA 2 deleterious germline mutation, and a personal history of HR positive breast cancer, s/p chemotherapy and surgery and radiation.  We reviewed the recommendation for best practice is for BSO (+/- hysterectomy) after age 63 and end of childbearing. The patient is interested in definitive surgery in the fall after she has recovered from breast cancer therapy. Her scans from August, 2018 showed normal appearing ovaries, though she has a fibroid uterus. She is interested in hysterectomy. I think this is feasible based on exam, though I stipulated that hysterectomy is associated with increased risk and recovery compared to BSO alone.  We discussed options including removal of just tubes (some risk reduction but unclear how much) with later removal of ovaries as separate procedure. I discussed that long term outcome data with this approach is unknown and there is not the same risk reduction in breast cancer recurrence associated with this.   I discussed risks associated with early menopause (early death from CV related disease, osteoporosis, menopausal symptoms). I discussed that given her early ovarian failure, these risks may not be substantially elevated from her baseline risk and she may not notice significant side effects. She realizes that BSO is associated with permanent infertility.   I discussed operative risks including  bleeding, infection, damage to internal organs (such as bladder,ureters, bowels), blood clot, reoperation and rehospitalization.  We will schedule an [place] in October to evaluate the ovaries and then presumptively schedule surgery for 12/27. She will notify us before that date if she wishes to continue  and what procedure she would like (salpingectomy, BSO, or hyst+ BSO).   HPI: Madison Savage is a 47 year old P3 who is seen in consultation at the request of Dr Burr Medico for a deleterious mutation in BRCA 2.  The patient has a strong paternal history of breast and ovarian malignancy. She has a paternal cousin who is a BRCA 1 deleterious mutation carrier. 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 She was tested for germline mutations and was noted to have a deleterious mutation in BRCA 2.  The patient is otherwise very healthy. She had 3 vaginal deliveries. 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 She had some menopausal symptoms and oligomenorrhea prior to her breast cancer treatment. She has never had abdominal surgeries.    Interval Hx: She went on to complete radiation in August, 2019. Since completing therapy she is somewhat overwhelmed by the idea of BSO and surgical menopause and not quite ready for the procedure.   Current Meds:  Outpatient Encounter Medications as of 06/01/2018  Medication Sig  . ALPRAZolam (XANAX) 0.25 MG tablet Take 1 tablet (0.25 mg total) by mouth at bedtime as needed for anxiety.  Marland Kitchen b complex vitamins capsule Take 1 capsule by mouth daily.  . Multiple Vitamins-Calcium (ONE-A-DAY WOMENS PO) Take 1 tablet by mouth daily.  Marland Kitchen OVER THE COUNTER MEDICATION Take 1 capsule by mouth daily. Biotin/MSM  . tamoxifen (NOLVADEX) 20 MG tablet TAKE 1 TABLET BY  MOUTH EVERY DAY   Facility-Administered Encounter Medications as of 06/01/2018  Medication  . sodium chloride flush (NS) 0.9 % injection 10 mL    Allergy:  Allergies  Allergen Reactions  . Clindamycin Anaphylaxis and Other (See Comments)    REACTION: rash, sob  . Penicillins Anaphylaxis and Other (See  Comments)    REACTION: rash  . Codeine Other (See Comments)    REACTION: sleepless,itches    Social Hx:   Social History   Socioeconomic History  . Marital status: Married    Spouse name: Not on file  . Number of children: 3  . Years of education: Not on file  . Highest education level: Not on file  Occupational History    Employer: UNEMPLOYED  Social Needs  . Financial resource strain: Not on file  . Food insecurity:    Worry: Not on file    Inability: Not on file  . Transportation needs:    Medical: Not on file    Non-medical: Not on file  Tobacco Use  . Smoking status: Former Smoker    Packs/day: 0.50    Years: 20.00    Pack years: 10.00    Types: Cigarettes    Last attempt to quit: 03/05/2017    Years since quitting: 1.2  . Smokeless tobacco: Never Used  Substance and Sexual Activity  . Alcohol use: No  . Drug use: No  . Sexual activity: Yes    Partners: Male    Birth control/protection: None  Lifestyle  . Physical activity:    Days per week: Not on file    Minutes per session: Not on file  . Stress: Not on file  Relationships  . Social connections:    Talks on phone: Not on file    Gets together: Not on file    Attends religious service: Not on file    Active member of club or organization: Not on file    Attends meetings of clubs or organizations: Not on file    Relationship status: Not on file  . Intimate partner violence:    Fear of current or ex partner: Not on file    Emotionally abused: Not on file    Physically abused: Not on file    Forced sexual activity: Not on file  Other Topics Concern  . Not on file  Social History Narrative   Stay at home mom   3 children ages 51, 58, 3    Past Surgical Hx:  Past Surgical History:  Procedure Laterality Date  . AXILLARY LYMPH NODE DISSECTION Left 04/27/2017   Procedure: AXILLARY LYMPH NODE DISSECTION;  Surgeon: [name], MD;  Location: [place];  Service: General;  Laterality:  Left;  . BILATERAL TOTAL MASTECTOMY WITH AXILLARY LYMPH NODE DISSECTION Bilateral 04/27/2017   Procedure: SKIN SPARING MASTECTOMY;  Surgeon: [name], MD;  Location: [place];  Service: General;  Laterality: Bilateral;  . BREAST CAPSULECTOMY WITH IMPLANT EXCHANGE Bilateral 12/06/2017   Procedure: BREAST CAPSULECTOMY WITH IMPLANT EXCHANGE;  Surgeon: Wallace Going, DO;  Location: [place];  Service: Plastics;  Laterality: Bilateral;  . BREAST RECONSTRUCTION WITH PLACEMENT OF TISSUE EXPANDER AND FLEX HD (ACELLULAR HYDRATED DERMIS) Bilateral 04/27/2017   Procedure: BILATERAL BREAST RECONSTRUCTION WITH PLACEMENT OF TISSUE EXPANDER AND FLEX HD (ACELLULAR HYDRATED DERMIS);  Surgeon: Wallace Going, DO;  Location: [place];  Service: Plastics;  Laterality: Bilateral;  . BREAST SURGERY    . MASTECTOMY    .  PLACEMENT OF BREAST IMPLANTS Bilateral 12/06/2017   Procedure: PLACEMENT OF BREAST IMPLANTS;  Surgeon: Wallace Going, DO;  Location: [place];  Service: Plastics;  Laterality: Bilateral;  . PORT-A-CATH REMOVAL Right 12/06/2017   Procedure: REMOVAL PORT-A-CATH;  Surgeon: Wallace Going, DO;  Location: [place];  Service: Plastics;  Laterality: Right;  . PORTACATH PLACEMENT Right 06/01/2017   Procedure: INSERTION PORT-A-CATH RIGHT SUBCLAVIAN;  Surgeon: [name], MD;  Location: [place];  Service: General;  Laterality: Right;  . WISDOM TOOTH EXTRACTION      Past Medical Hx:  Past Medical History:  Diagnosis Date  . Anemia   . Anxiety   . Cancer ([place]) 03/2017   left breast cancer  . Chronic ITP (idiopathic thrombocytopenia) (HCC)   . Family history of breast cancer   . Family history of ovarian cancer   . Headache    last migraine...9/24  . Heart murmur    at birth.  none now  . MTHFR mutation [facility])     Past Gynecological History:  No hx of abnormal paps, SVD x 3, known fibroid uterus.  No LMP recorded. (Menstrual status: Chemotherapy).  Family Hx:  Family History  Problem Relation Age of Onset  . Breast cancer Paternal Aunt 44  . Ovarian cancer Paternal Aunt 92  . Cancer Paternal Grandmother        breast cancer   . Cancer Maternal Aunt        lymphoma   . Cancer Paternal Grandfather        throat cancer   . Cancer Paternal Aunt        lung cancer  . Cirrhosis Father   . AAA (abdominal aortic aneurysm) Maternal Grandfather   . Leukemia Paternal Aunt     Review of Systems:  Constitutional  Feels well,    ENT Normal appearing ears and nares bilaterally Skin/Breast  No rash, sores, jaundice, itching, dryness Cardiovascular  No chest pain, shortness of breath, or edema  Pulmonary  No cough or wheeze.  Gastro Intestinal  No nausea, vomitting, or diarrhoea. No bright red blood per rectum, no abdominal pain, change in bowel movement, or constipation.  Genito Urinary  No frequency, urgency, dysuria, no bleeding Musculo Skeletal  No myalgia, arthralgia, joint swelling or pain  Neurologic  No weakness, numbness, change in gait,  Psychology  No depression, anxiety, insomnia.   Vitals:  Blood pressure 96/64, pulse 74, temperature 98.4 F (36.9 C), temperature source Oral, resp. rate 18, height _0  (1.727 m), weight 121 lb 6 oz (55.1 kg), SpO2 100 %.  Physical Exam: WD in NAD Neck  Supple NROM, without any enlargements.  Lymph Node Survey No cervical supraclavicular or inguinal adenopathy Cardiovascular  Pulse normal rate, regularity and rhythm. S1 and S2 normal.  Lungs  Clear to auscultation bilateraly, without wheezes/crackles/rhonchi. Good air movement.  Skin  No rash/lesions/breakdown  Psychiatry  Alert and oriented to person, place, and time  Abdomen  Normoactive bowel sounds, abdomen soft, non-tender and thin without evidence of hernia.  Back No CVA tenderness Genito Urinary  Vulva/vagina: Normal external female genitalia.  No lesions. No  discharge or bleeding.  Bladder/urethra:  No lesions or masses, well supported bladder  Vagina: normal  Cervix: Normal appearing, no lesions.  Uterus: approximately 10cm, mobile, no parametrial involvement or nodularity.  Adnexa: no discretely palpable masses. Rectal  deferred Extremities  No bilateral cyanosis, clubbing or edema.   Thereasa Solo, MD  06/01/2018, 2:37  PM

## 2018-06-01 NOTE — Patient Instructions (Addendum)
Dr Denman George has scheduled your surgery (robotic hysterectomy, removal of both tubes and ovaries) for December 27th. She has ordered an ultrasound to evaluate your ovaries in October.  If you decide that you would like to cancel the surgery, please let her office know at [phone]. Her office will otherwise contact you prior to that surgery date to discuss a preoperative appointment for lab work.  If you decide to proceed with surgery in December, please notify Dr Serita Grit office at the above number when you have decided which procedure you would like (removal of tubes, removal of tubes AND ovaries, removal of uterus AND tubes AND ovaries).                 Preparing for your Surgery  Plan for surgery on August 31, 2018 with Dr. Everitt Amber at [facility] will be scheduled for a robotic assisted total hysterectomy, bilateral salpingo-oophorectomy (subject to change based on your decision).  Pre-operative Testing -You will receive a phone call from presurgical testing at [facility] to arrange for a pre-operative testing appointment before your surgery.  This appointment normally occurs one to two weeks before your scheduled surgery.   -Bring your insurance card, copy of an advanced directive if applicable, medication list  -At that visit, you will be asked to sign a consent for a possible blood transfusion in case a transfusion becomes necessary during surgery.  The need for a blood transfusion is rare but having consent is a necessary part of your care.     -You should not be taking blood thinners or aspirin at least ten days prior to surgery unless instructed by your surgeon.  Day Before Surgery at [facility] will be asked to take in a light diet the day before surgery.  Avoid carbonated beverages.  You will be advised to have nothing to eat or drink after midnight the evening before.    Eat a light diet the day before surgery.  Examples including soups, broths, toast,  yogurt, mashed potatoes.  Things to avoid include carbonated beverages (fizzy beverages), raw fruits and raw vegetables, or beans.   If your bowels are filled with gas, your surgeon will have difficulty visualizing your pelvic organs which increases your surgical risks.  Your role in recovery Your role is to become active as soon as directed by your doctor, while still giving yourself time to heal.  Rest when you feel tired. You will be asked to do the following in order to speed your recovery:  - Cough and breathe deeply. This helps toclear and expand your lungs and can prevent pneumonia. You may be given a spirometer to practice deep breathing. A staff member will show you how to use the spirometer. - Do mild physical activity. Walking or moving your legs help your circulation and body functions return to normal. A staff member will help you when you try to walk and will provide you with simple exercises. Do not try to get up or walk alone the first time. - Actively manage your pain. Managing your pain lets you move in comfort. We will ask you to rate your pain on a scale of zero to 10. It is your responsibility to tell your doctor or nurse where and how much you hurt so your pain can be treated.  Special Considerations -If you are diabetic, you may be placed on insulin after surgery to have closer control over your blood sugars to promote healing and  recovery.  This does not mean that you will be discharged on insulin.  If applicable, your oral antidiabetics will be resumed when you are tolerating a solid diet.  -Your final pathology results from surgery should be available by the Friday after surgery and the results will be relayed to you when available.  -Dr. Lahoma Crocker could be the surgeon that assists your GYN Oncologist with surgery.  The next day after your surgery you will either see your GYN Oncologist or Dr. Lahoma Crocker.   Blood Transfusion Information WHAT IS A BLOOD  TRANSFUSION? A transfusion is the replacement of blood or some of its parts. Blood is made up of multiple cells which provide different functions.  Red blood cells carry oxygen and are used for blood loss replacement.  White blood cells fight against infection.  Platelets control bleeding.  Plasma helps clot blood.  Other blood products are available for specialized needs, such as hemophilia or other clotting disorders. BEFORE THE TRANSFUSION  Who gives blood for transfusions?   You may be able to donate blood to be used at a later date on yourself (autologous donation).  Relatives can be asked to donate blood. This is generally not any safer than if you have received blood from a stranger. The same precautions are taken to ensure safety when a relative's blood is donated.  Healthy volunteers who are fully evaluated to make sure their blood is safe. This is blood bank blood. Transfusion therapy is the safest it has ever been in the practice of medicine. Before blood is taken from a donor, a complete history is taken to make sure that person has no history of diseases nor engages in risky social behavior (examples are intravenous drug use or sexual activity with multiple partners). The donor's travel history is screened to minimize risk of transmitting infections, such as malaria. The donated blood is tested for signs of infectious diseases, such as HIV and hepatitis. The blood is then tested to be sure it is compatible with you in order to minimize the chance of a transfusion reaction. If you or a relative donates blood, this is often done in anticipation of surgery and is not appropriate for emergency situations. It takes many days to process the donated blood. RISKS AND COMPLICATIONS Although transfusion therapy is very safe and saves many lives, the main dangers of transfusion include:   Getting an infectious disease.  Developing a transfusion reaction. This is an allergic reaction to  something in the blood you were given. Every precaution is taken to prevent this. The decision to have a blood transfusion has been considered carefully by your caregiver before blood is given. Blood is not given unless the benefits outweigh the risks.

## 2018-06-03 ENCOUNTER — Other Ambulatory Visit: Payer: Self-pay | Admitting: Hematology

## 2018-06-05 ENCOUNTER — Encounter

## 2018-06-15 ENCOUNTER — Other Ambulatory Visit: Payer: Self-pay

## 2018-06-15 ENCOUNTER — Ambulatory Visit: Payer: BLUE CROSS/BLUE SHIELD | Attending: Hematology | Admitting: Physical Therapy

## 2018-06-15 ENCOUNTER — Encounter: Payer: Self-pay | Admitting: Physical Therapy

## 2018-06-15 DIAGNOSIS — M25612 Stiffness of left shoulder, not elsewhere classified: Secondary | ICD-10-CM | POA: Diagnosis not present

## 2018-06-15 DIAGNOSIS — R293 Abnormal posture: Secondary | ICD-10-CM

## 2018-06-15 DIAGNOSIS — M25512 Pain in left shoulder: Secondary | ICD-10-CM | POA: Diagnosis not present

## 2018-06-15 NOTE — Therapy (Signed)
[place], [place], [address] Phone: [phone]   Fax:  [phone]  Physical Therapy Treatment  Patient Details  Name: Madison Savage MRN: [id number] Date of Birth: 05-Apr-1971 Referring Provider (PT): Dr. Burr Medico    Encounter Date: 06/15/2018  PT End of Session - 06/15/18 1016    Visit Number  16    Number of Visits  31    Date for PT Re-Evaluation  07/08/18    PT Start Time  1016    PT Stop Time  1110    PT Time Calculation (min)  54 min    Activity Tolerance  Patient tolerated treatment well    Behavior During Therapy  [facility] for tasks assessed/performed       Past Medical History:  Diagnosis Date  . Anemia   . Anxiety   . Cancer ([place]) 03/2017   left breast cancer  . Chronic ITP (idiopathic thrombocytopenia) (HCC)   . Family history of breast cancer   . Family history of ovarian cancer   . Headache    last migraine...9/24  . Heart murmur    at birth.  none now  . MTHFR mutation [facility])     Past Surgical History:  Procedure Laterality Date  . AXILLARY LYMPH NODE DISSECTION Left 04/27/2017   Procedure: AXILLARY LYMPH NODE DISSECTION;  Surgeon: [name], MD;  Location: [facility];  Service: General;  Laterality: Left;  . BILATERAL TOTAL MASTECTOMY WITH AXILLARY LYMPH NODE DISSECTION Bilateral 04/27/2017   Procedure: SKIN SPARING MASTECTOMY;  Surgeon: [name], MD;  Location: [place];  Service: General;  Laterality: Bilateral;  . BREAST CAPSULECTOMY WITH IMPLANT EXCHANGE Bilateral 12/06/2017   Procedure: BREAST CAPSULECTOMY WITH IMPLANT EXCHANGE;  Surgeon: Wallace Going, DO;  Location: [place];  Service: Plastics;  Laterality: Bilateral;  . BREAST RECONSTRUCTION WITH PLACEMENT OF TISSUE EXPANDER AND FLEX HD (ACELLULAR HYDRATED DERMIS) Bilateral 04/27/2017   Procedure: BILATERAL BREAST RECONSTRUCTION WITH PLACEMENT OF TISSUE EXPANDER AND  FLEX HD (ACELLULAR HYDRATED DERMIS);  Surgeon: Wallace Going, DO;  Location: [facility];  Service: Plastics;  Laterality: Bilateral;  . BREAST SURGERY    . MASTECTOMY    . PLACEMENT OF BREAST IMPLANTS Bilateral 12/06/2017   Procedure: PLACEMENT OF BREAST IMPLANTS;  Surgeon: Wallace Going, DO;  Location: [place];  Service: Plastics;  Laterality: Bilateral;  . PORT-A-CATH REMOVAL Right 12/06/2017   Procedure: REMOVAL PORT-A-CATH;  Surgeon: Wallace Going, DO;  Location: [place];  Service: Plastics;  Laterality: Right;  . PORTACATH PLACEMENT Right 06/01/2017   Procedure: INSERTION PORT-A-CATH RIGHT SUBCLAVIAN;  Surgeon: [name], MD;  Location: [facility];  Service: General;  Laterality: Right;  . WISDOM TOOTH EXTRACTION      There were no vitals filed for this visit.  Subjective Assessment - 06/15/18 1017    Subjective  "I just got back from trip, I am a little exhausted. The shoulder has little lock going on, and I did noticed some soreness but it wasn't that bad"     Patient Stated Goals  to get rid of shoulder pain     Currently in Pain?  Yes    Pain Score  3     Pain Location  Shoulder    Pain Orientation  Left    Pain Descriptors / Indicators  Tightness   stiffness   Pain Type  Chronic pain    Pain  Onset  1 to 4 weeks ago    Pain Frequency  Intermittent    Aggravating Factors   lifting arm overhead,                        [facility] Adult PT Treatment/Exercise - 06/15/18 0001      Shoulder Exercises: Stretch   Other Shoulder Stretches  rhomboid stretch 2 x 30 sec    Other Shoulder Stretches  tricep stretch 2 x 30 sec      Modalities   Modalities  Cryotherapy      Cryotherapy   Number Minutes Cryotherapy  10 Minutes    Cryotherapy Location  Shoulder    Type of Cryotherapy  Ice pack      Manual Therapy   Manual Therapy  Scapular mobilization    Manual therapy comments  skilled palpation and  monitoring of pt throughtout TPDN    Soft tissue mobilization  IASTM along L upper trap/latissmus dorsi, proximal tricepand biceps brachi    Scapular Mobilization  L scapular mobs with focus on upward rotation working into end ranges       Trigger Point Dry Needling - 06/15/18 1024    Consent Given?  Yes    Education Handout Provided  No   given previously   Muscles Treated Upper Body  Subscapularis   biceps brachii   Levator Scapulae Response  Twitch response elicited;Palpable increased muscle length    Rhomboids Response  Twitch response elicited;Palpable increased muscle length    Subscapularis Response  Twitch response elicited;Palpable increased muscle length           PT Education - 06/15/18 1106    Education provided  Yes    Education Details  updated HEP for stretch of rhomboids and tricep    Person(s) Educated  Patient    Methods  Explanation;Verbal cues;Handout;Demonstration    Comprehension  Verbalized understanding;Verbal cues required;Returned demonstration          PT Long Term Goals - 05/15/18 1245      PT LONG TERM GOAL #1   Title  Pt will report that the pain and discomfort in left shoulder has decreased by 50%     Baseline  Pain decreased by 80%    Status  Achieved      PT LONG TERM GOAL #2   Title  Pt will be independent in upgraded HEP for scapular strength and ROM     Baseline  Pt continues to have decreased strength in right scapular muscles with decreased muscle bulk compared to left side     Time  8    Period  Weeks    Status  On-going      PT LONG TERM GOAL #3   Title  Increase left shoulder AROM flexion to >/= 155 degrees to increase ease with reaching overhead.    Baseline  135 degrees today 03/27/18, ~ 145 on 05/15/2018     Time  8    Period  Weeks    Status  On-going      PT LONG TERM GOAL #4   Title  Patient will report >/= 50% decrease in axillary tightness.    Time  8    Period  Weeks    Status  On-going            Plan  - 06/15/18 1106    Clinical Impression Statement  pt just returned from her trip reporting she did good but did  notice intermittent tightness inthe shoulder. Continued TPDN along the upper trap/ levator scapulae, latissmus dorsi, and bicep bracii followed with IASTM and stretching. following scapular mobs she reported improvement of tightness. she reported soreness which utilized ice pack end of session to calm down.     PT Next Visit Plan  response to DN to trigger points with stretching, manual work and progression of home exercise as indicated .  check compressio sleeve if needed     PT Home Exercise Plan  Post op shoulder ROM HEP    Consulted and Agree with Plan of Care  Patient       Patient will benefit from skilled therapeutic intervention in order to improve the following deficits and impairments:     Visit Diagnosis: Stiffness of left shoulder, not elsewhere classified  Acute pain of left shoulder  Abnormal posture     Problem List Patient Active Problem List   Diagnosis Date Noted  . Port-A-Cath in place 08/18/2017  . Genetic testing 04/17/2017  . BRCA2 gene mutation positive 04/17/2017  . Family history of breast cancer   . Family history of ovarian cancer   . 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 ([place]) 03/29/2017  . Cough 03/10/2014  . Esophagitis 03/10/2014  . Chronic ITP (idiopathic thrombocytopenia) (HCC) 03/10/2014  . ALLERGIC REACTION, ACUTE 07/22/2008   Starr Lake PT, DPT, LAT, ATC  06/15/18  11:12 AM      [facility] [address] [place], [place], [address] Phone: [phone]   Fax:  [phone]  Name: Madison Savage MRN: [id number] Date of Birth: 10-24-1970

## 2018-06-18 ENCOUNTER — Ambulatory Visit (HOSPITAL_COMMUNITY)
Admission: RE | Admit: 2018-06-18 | Discharge: 2018-06-18 | Disposition: A | Discharge status: Home / Self Care | Payer: BLUE CROSS/BLUE SHIELD | Source: Ambulatory Visit | Attending: Gynecologic Oncology | Admitting: Gynecologic Oncology

## 2018-06-18 DIAGNOSIS — Z1501 Genetic susceptibility to malignant neoplasm of breast: Secondary | ICD-10-CM | POA: Insufficient documentation

## 2018-06-18 DIAGNOSIS — D259 Leiomyoma of uterus, unspecified: Secondary | ICD-10-CM | POA: Diagnosis not present

## 2018-06-18 DIAGNOSIS — Z1509 Genetic susceptibility to other malignant neoplasm: Secondary | ICD-10-CM | POA: Diagnosis not present

## 2018-06-20 ENCOUNTER — Ambulatory Visit: Payer: BLUE CROSS/BLUE SHIELD | Admitting: Physical Therapy

## 2018-06-20 ENCOUNTER — Encounter: Payer: Self-pay | Admitting: Physical Therapy

## 2018-06-20 ENCOUNTER — Telehealth: Payer: Self-pay

## 2018-06-20 DIAGNOSIS — M25612 Stiffness of left shoulder, not elsewhere classified: Secondary | ICD-10-CM | POA: Diagnosis not present

## 2018-06-20 DIAGNOSIS — M25512 Pain in left shoulder: Secondary | ICD-10-CM

## 2018-06-20 DIAGNOSIS — R293 Abnormal posture: Secondary | ICD-10-CM

## 2018-06-20 NOTE — Telephone Encounter (Signed)
Outgoing call to patient, no answer, left VM per Joylene John NP - for pt to call our office to receive recent U/S pelvis results.   When she call back per Lenna Sciara NP:  "Let her know ovaries not seen but no masses seen, small fibroid slightly larger"

## 2018-06-20 NOTE — Telephone Encounter (Signed)
Incoming call from pt - she is calling us back.  Results per Joylene John NP regarding recent pelvic u/s- see my previous note.  Pt voiced understanding.  Pt would like f/u appt to discuss possible surgery for December - per Dr Denman George notes - different options previously discussed and pt would like to review those options as she verbalized it being a big decision.  Appt given for this Monday 10-21 at 9:15am. No other needs per pt at this time.

## 2018-06-20 NOTE — Therapy (Signed)
[place], [place], [address] Phone: [phone]   Fax:  [phone]  Physical Therapy Treatment  Patient Details  Name: Madison Savage MRN: [id number] Date of Birth: Apr 23, 1971 Referring Provider (PT): Dr. Burr Medico    Encounter Date: 06/20/2018  PT End of Session - 06/20/18 1018    Visit Number  17    Number of Visits  31    Date for PT Re-Evaluation  07/08/18    PT Start Time  1018    PT Stop Time  1100    PT Time Calculation (min)  42 min    Activity Tolerance  Patient tolerated treatment well    Behavior During Therapy  [facility] for tasks assessed/performed       Past Medical History:  Diagnosis Date  . Anemia   . Anxiety   . Cancer ([place]) 03/2017   left breast cancer  . Chronic ITP (idiopathic thrombocytopenia) (HCC)   . Family history of breast cancer   . Family history of ovarian cancer   . Headache    last migraine...9/24  . Heart murmur    at birth.  none now  . MTHFR mutation [facility])     Past Surgical History:  Procedure Laterality Date  . AXILLARY LYMPH NODE DISSECTION Left 04/27/2017   Procedure: AXILLARY LYMPH NODE DISSECTION;  Surgeon: [name], MD;  Location: [place];  Service: General;  Laterality: Left;  . BILATERAL TOTAL MASTECTOMY WITH AXILLARY LYMPH NODE DISSECTION Bilateral 04/27/2017   Procedure: SKIN SPARING MASTECTOMY;  Surgeon: [name], MD;  Location: [place];  Service: General;  Laterality: Bilateral;  . BREAST CAPSULECTOMY WITH IMPLANT EXCHANGE Bilateral 12/06/2017   Procedure: BREAST CAPSULECTOMY WITH IMPLANT EXCHANGE;  Surgeon: Wallace Going, DO;  Location: [place];  Service: Plastics;  Laterality: Bilateral;  . BREAST RECONSTRUCTION WITH PLACEMENT OF TISSUE EXPANDER AND FLEX HD (ACELLULAR HYDRATED DERMIS) Bilateral 04/27/2017   Procedure: BILATERAL BREAST RECONSTRUCTION WITH PLACEMENT OF TISSUE EXPANDER AND  FLEX HD (ACELLULAR HYDRATED DERMIS);  Surgeon: Wallace Going, DO;  Location: [place];  Service: Plastics;  Laterality: Bilateral;  . BREAST SURGERY    . MASTECTOMY    . PLACEMENT OF BREAST IMPLANTS Bilateral 12/06/2017   Procedure: PLACEMENT OF BREAST IMPLANTS;  Surgeon: Wallace Going, DO;  Location: [place];  Service: Plastics;  Laterality: Bilateral;  . PORT-A-CATH REMOVAL Right 12/06/2017   Procedure: REMOVAL PORT-A-CATH;  Surgeon: Wallace Going, DO;  Location: [facility] [facility];  Service: Plastics;  Laterality: Right;  . PORTACATH PLACEMENT Right 06/01/2017   Procedure: INSERTION PORT-A-CATH RIGHT SUBCLAVIAN;  Surgeon: [name], MD;  Location: [place];  Service: General;  Laterality: Right;  . WISDOM TOOTH EXTRACTION      There were no vitals filed for this visit.  Subjective Assessment - 06/20/18 1018    Subjective  "I was very sore for the last few days after the last session,  but today I am able to reach much higher"    Patient Stated Goals  to get rid of shoulder pain     Currently in Pain?  Yes    Pain Score  1     Pain Location  Shoulder    Pain Orientation  Left    Pain Descriptors / Indicators  Tightness;Sore    Pain Type  Chronic pain    Pain Onset  1 to 4 weeks ago  Pain Frequency  Intermittent                       [facility] Adult PT Treatment/Exercise - 06/20/18 1034      Self-Care   Self-Care  Other Self-Care Comments    Other Self-Care Comments   MTPR and how to perform at home using [employer] and where to find tools      Shoulder Exercises: Supine   Other Supine Exercises  supine foam roll ceiling punches, horizontal add/ abd, alternating ceiling punches, snow angles x 15 ea.       Shoulder Exercises: Stretch   Other Shoulder Stretches  rhomboid stretch 2 x 30 sec    Other Shoulder Stretches  tricep stretch 2 x 30 sec      Manual Therapy   Manual therapy comments  MTPR along  proximal tricep    Scapular Mobilization  L scapular mobs with focus on upward rotation working into end ranges             PT Education - 06/20/18 1143    Education provided  Yes    Education Details  reviewed previous HEP and benefits of stretching and ROM.     Person(s) Educated  Patient    Methods  Explanation;Verbal cues    Comprehension  Verbalized understanding;Verbal cues required          PT Long Term Goals - 05/15/18 1245      PT LONG TERM GOAL #1   Title  Pt will report that the pain and discomfort in left shoulder has decreased by 50%     Baseline  Pain decreased by 80%    Status  Achieved      PT LONG TERM GOAL #2   Title  Pt will be independent in upgraded HEP for scapular strength and ROM     Baseline  Pt continues to have decreased strength in right scapular muscles with decreased muscle bulk compared to left side     Time  8    Period  Weeks    Status  On-going      PT LONG TERM GOAL #3   Title  Increase left shoulder AROM flexion to >/= 155 degrees to increase ease with reaching overhead.    Baseline  135 degrees today 03/27/18, ~ 145 on 05/15/2018     Time  8    Period  Weeks    Status  On-going      PT LONG TERM GOAL #4   Title  Patient will report >/= 50% decrease in axillary tightness.    Time  8    Period  Weeks    Status  On-going            Plan - 06/20/18 1144    Clinical Impression Statement  pt reports improvement since the last session with reaching overhead noting only minimal tightness in the pec. educated about manual trigger point release and how to perform at home. continued scapular stability thoracic mobility which she performed well. end of session she reported decreased tightness /soreness.     PT Treatment/Interventions  Therapeutic exercise;Patient/family education;ADLs/Self Care Home Management;Taping;DME Instruction;Dry needling;Functional mobility training;Therapeutic activities;Manual lymph drainage;Manual  techniques;Passive range of motion;Electrical Stimulation;Other (comment)    PT Next Visit Plan  response to DN to trigger points with stretching, manual work and progression of home exercise as indicated .  check compressio sleeve if needed     PT Home Exercise Plan  Post  op shoulder ROM HEP    Consulted and Agree with Plan of Care  Patient       Patient will benefit from skilled therapeutic intervention in order to improve the following deficits and impairments:  Decreased range of motion, Pain, Postural dysfunction, Increased fascial restricitons, Impaired perceived functional ability  Visit Diagnosis: Stiffness of left shoulder, not elsewhere classified  Acute pain of left shoulder  Abnormal posture     Problem List Patient Active Problem List   Diagnosis Date Noted  . Port-A-Cath in place 08/18/2017  . Genetic testing 04/17/2017  . BRCA2 gene mutation positive 04/17/2017  . Family history of breast cancer   . Family history of ovarian cancer   . 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 ([place]) 03/29/2017  . Cough 03/10/2014  . Esophagitis 03/10/2014  . Chronic ITP (idiopathic thrombocytopenia) (HCC) 03/10/2014  . ALLERGIC REACTION, ACUTE 07/22/2008   Starr Lake PT, DPT, LAT, ATC  06/20/18  11:47 AM      [facility] [address] [place], [place], [address] Phone: [phone]   Fax:  [phone]  Name: BLAISE PALLADINO MRN: [id number] Date of Birth: 1971/03/14

## 2018-06-25 ENCOUNTER — Encounter: Payer: Self-pay | Admitting: Gynecologic Oncology

## 2018-06-25 ENCOUNTER — Inpatient Hospital Stay: Payer: BLUE CROSS/BLUE SHIELD | Attending: Hematology | Admitting: Gynecologic Oncology

## 2018-06-25 VITALS — BP 113/60 | HR 77 | Temp 97.9°F | Resp 20 | Ht 68.0 in | Wt 124.0 lb

## 2018-06-25 DIAGNOSIS — Z87891 Personal history of nicotine dependence: Secondary | ICD-10-CM | POA: Insufficient documentation

## 2018-06-25 DIAGNOSIS — Z191 Hormone sensitive malignancy status: Secondary | ICD-10-CM | POA: Insufficient documentation

## 2018-06-25 DIAGNOSIS — Z853 Personal history of malignant neoplasm of breast: Secondary | ICD-10-CM | POA: Insufficient documentation

## 2018-06-25 DIAGNOSIS — Z1501 Genetic susceptibility to malignant neoplasm of breast: Secondary | ICD-10-CM

## 2018-06-25 DIAGNOSIS — Z1509 Genetic susceptibility to other malignant neoplasm: Secondary | ICD-10-CM

## 2018-06-25 DIAGNOSIS — Z1502 Genetic susceptibility to malignant neoplasm of ovary: Secondary | ICD-10-CM | POA: Insufficient documentation

## 2018-06-25 DIAGNOSIS — Z148 Genetic carrier of other disease: Secondary | ICD-10-CM | POA: Insufficient documentation

## 2018-06-25 NOTE — Patient Instructions (Signed)
Preparing for your Surgery  Plan for surgery on August 31, 2018 with Dr. Everitt Amber at [place] will be scheduled for a robotic bilateral salpingo-oophorectomy.  Pre-operative Testing -You will receive a phone call from presurgical testing at [facility] to arrange for a pre-operative testing appointment before your surgery.  This appointment normally occurs one to two weeks before your scheduled surgery.   -Bring your insurance card, copy of an advanced directive if applicable, medication list  -At that visit, you will be asked to sign a consent for a possible blood transfusion in case a transfusion becomes necessary during surgery.  The need for a blood transfusion is rare but having consent is a necessary part of your care.     -You should not be taking blood thinners or aspirin at least ten days prior to surgery unless instructed by your surgeon.  Day Before Surgery at [place] will be asked to take in a light diet the day before surgery.  Avoid carbonated beverages.  You will be advised to have nothing to eat or drink after midnight the evening before.    Eat a light diet the day before surgery.  Examples including soups, broths, toast, yogurt, mashed potatoes.  Things to avoid include carbonated beverages (fizzy beverages), raw fruits and raw vegetables, or beans.   If your bowels are filled with gas, your surgeon will have difficulty visualizing your pelvic organs which increases your surgical risks.  Your role in recovery Your role is to become active as soon as directed by your doctor, while still giving yourself time to heal.  Rest when you feel tired. You will be asked to do the following in order to speed your recovery:  - Cough and breathe deeply. This helps toclear and expand your lungs and can prevent pneumonia. You may be given a spirometer to practice deep breathing. A staff member will show you how to use the spirometer. - Do mild  physical activity. Walking or moving your legs help your circulation and body functions return to normal. A staff member will help you when you try to walk and will provide you with simple exercises. Do not try to get up or walk alone the first time. - Actively manage your pain. Managing your pain lets you move in comfort. We will ask you to rate your pain on a scale of zero to 10. It is your responsibility to tell your doctor or nurse where and how much you hurt so your pain can be treated.  Special Considerations -If you are diabetic, you may be placed on insulin after surgery to have closer control over your blood sugars to promote healing and recovery.  This does not mean that you will be discharged on insulin.  If applicable, your oral antidiabetics will be resumed when you are tolerating a solid diet.   Blood Transfusion Information WHAT IS A BLOOD TRANSFUSION? A transfusion is the replacement of blood or some of its parts. Blood is made up of multiple cells which provide different functions.  Red blood cells carry oxygen and are used for blood loss replacement.  White blood cells fight against infection.  Platelets control bleeding.  Plasma helps clot blood.  Other blood products are available for specialized needs, such as hemophilia or other clotting disorders. BEFORE THE TRANSFUSION  Who gives blood for transfusions?   You may be able to donate blood to be used at a later date on yourself (autologous donation).  Relatives can be asked to donate blood. This is generally not any safer than if you have received blood from a stranger. The same precautions are taken to ensure safety when a relative's blood is donated.  Healthy volunteers who are fully evaluated to make sure their blood is safe. This is blood bank blood. Transfusion therapy is the safest it has ever been in the practice of medicine. Before blood is taken from a donor, a complete history is taken to make sure that  person has no history of diseases nor engages in risky social behavior (examples are intravenous drug use or sexual activity with multiple partners). The donor's travel history is screened to minimize risk of transmitting infections, such as malaria. The donated blood is tested for signs of infectious diseases, such as HIV and hepatitis. The blood is then tested to be sure it is compatible with you in order to minimize the chance of a transfusion reaction. If you or a relative donates blood, this is often done in anticipation of surgery and is not appropriate for emergency situations. It takes many days to process the donated blood. RISKS AND COMPLICATIONS Although transfusion therapy is very safe and saves many lives, the main dangers of transfusion include:   Getting an infectious disease.  Developing a transfusion reaction. This is an allergic reaction to something in the blood you were given. Every precaution is taken to prevent this. The decision to have a blood transfusion has been considered carefully by your caregiver before blood is given. Blood is not given unless the benefits outweigh the risks.

## 2018-06-25 NOTE — Progress Notes (Signed)
Follow-up Note: Gyn-Onc  Consult was initially requested by Dr. Burr Medico for the evaluation of HARSHINI TRENT 47 y.o. female  CC:  Chief Complaint  Patient presents with  . BRCA2 gene mutation positive    Assessment/Plan:  Ms. EMARA LICHTER  is a 47 y.o.  year old with BRCA 2 deleterious germline mutation, and a personal history of HR positive breast cancer, s/p chemotherapy and surgery and radiation.  We reviewed the recommendation for best practice is for BSO (+/- hysterectomy) after age 24 and end of childbearing. The patient is interested in definitive surgery in December.  We discussed options including removal of just tubes (some risk reduction but unclear how much) with later removal of ovaries as separate procedure. I discussed that long term outcome data with this approach is unknown and there is not the same risk reduction in breast cancer recurrence associated with this.   I discussed risks associated with early menopause (early death from CV related disease, osteoporosis, menopausal symptoms). I discussed that given her early ovarian failure, these risks may not be substantially elevated from her baseline risk and she may not notice significant side effects. She realizes that BSO is associated with permanent infertility.   I discussed operative risks including  bleeding, infection, damage to internal organs (such as bladder,ureters, bowels), blood clot, reoperation and rehospitalization.  HPI: Ms Zayley Arras is a 47 year old P3 who is seen in consultation at the request of Dr Burr Medico for a deleterious mutation in BRCA 2.  The patient has a strong paternal history of breast and ovarian malignancy. She has a paternal cousin who is a BRCA 1 deleterious mutation carrier. 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 She was tested for germline mutations and was  noted to have a deleterious mutation in BRCA 2.  The patient is otherwise very healthy. She had 3 vaginal deliveries. 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 She had some menopausal symptoms and oligomenorrhea prior to her breast cancer treatment. She has never had abdominal surgeries.    Interval Hx: She went on to complete radiation in August, 2019. Since completing therapy she is somewhat overwhelmed by the idea of BSO and surgical menopause and was not quite ready for the procedure.  [place] on 06/18/18 showed non visible ovaries and a uterus measuring 8.5cm x 3.4cm x 5.2cm. It contained a 2.3cm fibroid.  She had come to determine that she desires a BSO or salpingectomy only procedure (not hysterectomy).  She desires this on 08/31/18.   Current Meds:  Outpatient Encounter Medications as of 06/25/2018  Medication Sig  . b complex vitamins capsule Take 1 capsule by mouth daily.  . Cyanocobalamin POWD Take by mouth.  . Multiple Vitamins-Calcium (ONE-A-DAY WOMENS PO) Take 1 tablet by mouth daily.  Marland Kitchen OVER THE COUNTER MEDICATION Take 1 capsule by mouth daily. Biotin/MSM  . tamoxifen (NOLVADEX) 20 MG tablet TAKE 1 TABLET BY MOUTH EVERY DAY  . ALPRAZolam (XANAX) 0.25 MG tablet Take 1 tablet (0.25 mg total) by mouth at bedtime as needed for anxiety. (Patient not taking: Reported on 06/25/2018)   Facility-Administered Encounter Medications as of 06/25/2018  Medication  . sodium chloride flush (NS) 0.9 % injection 10 mL    Allergy:  Allergies  Allergen Reactions  . Clindamycin Anaphylaxis  and Other (See Comments)    REACTION: rash, sob  . Penicillins Anaphylaxis and Other (See Comments)    REACTION: rash  . Codeine Other (See Comments)    REACTION: sleepless,itches    Social Hx:   Social History   Socioeconomic History  . Marital status: Married    Spouse name: Not on file  . Number of  children: 3  . Years of education: Not on file  . Highest education level: Not on file  Occupational History    Employer: UNEMPLOYED  Social Needs  . Financial resource strain: Not on file  . Food insecurity:    Worry: Not on file    Inability: Not on file  . Transportation needs:    Medical: Not on file    Non-medical: Not on file  Tobacco Use  . Smoking status: Former Smoker    Packs/day: 0.50    Years: 20.00    Pack years: 10.00    Types: Cigarettes    Last attempt to quit: 03/05/2017    Years since quitting: 1.3  . Smokeless tobacco: Never Used  Substance and Sexual Activity  . Alcohol use: No  . Drug use: No  . Sexual activity: Yes    Partners: Male    Birth control/protection: None  Lifestyle  . Physical activity:    Days per week: Not on file    Minutes per session: Not on file  . Stress: Not on file  Relationships  . Social connections:    Talks on phone: Not on file    Gets together: Not on file    Attends religious service: Not on file    Active member of club or organization: Not on file    Attends meetings of clubs or organizations: Not on file    Relationship status: Not on file  . Intimate partner violence:    Fear of current or ex partner: Not on file    Emotionally abused: Not on file    Physically abused: Not on file    Forced sexual activity: Not on file  Other Topics Concern  . Not on file  Social History Narrative   Stay at home mom   3 children ages 28, 73, 3    Past Surgical Hx:  Past Surgical History:  Procedure Laterality Date  . AXILLARY LYMPH NODE DISSECTION Left 04/27/2017   Procedure: AXILLARY LYMPH NODE DISSECTION;  Surgeon: [name], MD;  Location: [place];  Service: General;  Laterality: Left;  . BILATERAL TOTAL MASTECTOMY WITH AXILLARY LYMPH NODE DISSECTION Bilateral 04/27/2017   Procedure: SKIN SPARING MASTECTOMY;  Surgeon: [name], MD;  Location: [place];  Service: General;   Laterality: Bilateral;  . BREAST CAPSULECTOMY WITH IMPLANT EXCHANGE Bilateral 12/06/2017   Procedure: BREAST CAPSULECTOMY WITH IMPLANT EXCHANGE;  Surgeon: Wallace Going, DO;  Location: [place];  Service: Plastics;  Laterality: Bilateral;  . BREAST RECONSTRUCTION WITH PLACEMENT OF TISSUE EXPANDER AND FLEX HD (ACELLULAR HYDRATED DERMIS) Bilateral 04/27/2017   Procedure: BILATERAL BREAST RECONSTRUCTION WITH PLACEMENT OF TISSUE EXPANDER AND FLEX HD (ACELLULAR HYDRATED DERMIS);  Surgeon: Wallace Going, DO;  Location: [place];  Service: Plastics;  Laterality: Bilateral;  . BREAST SURGERY    . MASTECTOMY    . PLACEMENT OF BREAST IMPLANTS Bilateral 12/06/2017   Procedure: PLACEMENT OF BREAST IMPLANTS;  Surgeon: Wallace Going, DO;  Location: [place];  Service: Plastics;  Laterality: Bilateral;  . PORT-A-CATH REMOVAL  Right 12/06/2017   Procedure: REMOVAL PORT-A-CATH;  Surgeon: Wallace Going, DO;  Location: [place];  Service: Plastics;  Laterality: Right;  . PORTACATH PLACEMENT Right 06/01/2017   Procedure: INSERTION PORT-A-CATH RIGHT SUBCLAVIAN;  Surgeon: [name], MD;  Location: [place];  Service: General;  Laterality: Right;  . WISDOM TOOTH EXTRACTION      Past Medical Hx:  Past Medical History:  Diagnosis Date  . Anemia   . Anxiety   . Cancer ([place]) 03/2017   left breast cancer  . Chronic ITP (idiopathic thrombocytopenia) (HCC)   . Family history of breast cancer   . Family history of ovarian cancer   . Headache    last migraine...9/24  . Heart murmur    at birth.  none now  . MTHFR mutation [facility])     Past Gynecological History:  No hx of abnormal paps, SVD x 3, known fibroid uterus. No LMP recorded. (Menstrual status: Chemotherapy).  Family Hx:  Family History  Problem Relation Age of Onset  . Breast cancer Paternal Aunt 21  . Ovarian cancer Paternal Aunt 49  . Cancer Paternal Grandmother         breast cancer   . Cancer Maternal Aunt        lymphoma   . Cancer Paternal Grandfather        throat cancer   . Cancer Paternal Aunt        lung cancer  . Cirrhosis Father   . AAA (abdominal aortic aneurysm) Maternal Grandfather   . Leukemia Paternal Aunt     Review of Systems:  Constitutional  Feels well,    ENT Normal appearing ears and nares bilaterally Skin/Breast  No rash, sores, jaundice, itching, dryness Cardiovascular  No chest pain, shortness of breath, or edema  Pulmonary  No cough or wheeze.  Gastro Intestinal  No nausea, vomitting, or diarrhoea. No bright red blood per rectum, no abdominal pain, change in bowel movement, or constipation.  Genito Urinary  No frequency, urgency, dysuria, no bleeding Musculo Skeletal  No myalgia, arthralgia, joint swelling or pain  Neurologic  No weakness, numbness, change in gait,  Psychology  No depression, anxiety, insomnia.   Vitals:  Blood pressure 113/60, pulse 77, temperature 97.9 F (36.6 C), temperature source Oral, resp. rate 20, height _0  (1.727 m), weight 124 lb (56.2 kg), SpO2 100 %.  Physical Exam: WD in NAD Neck  Supple NROM, without any enlargements.  Lymph Node Survey No cervical supraclavicular or inguinal adenopathy Cardiovascular  Pulse normal rate, regularity and rhythm. S1 and S2 normal.  Lungs  Clear to auscultation bilateraly, without wheezes/crackles/rhonchi. Good air movement.  Skin  No rash/lesions/breakdown  Psychiatry  Alert and oriented to person, place, and time  Abdomen  Normoactive bowel sounds, abdomen soft, non-tender and thin without evidence of hernia.  Back No CVA tenderness Genito Urinary  Vulva/vagina: Normal external female genitalia.  No lesions. No discharge or bleeding.  Bladder/urethra:  No lesions or masses, well supported bladder  Vagina: normal  Cervix: Normal appearing, no lesions.  Uterus: approximately 10cm, mobile, no parametrial involvement or  nodularity.  Adnexa: no discretely palpable masses. Rectal  deferred Extremities  No bilateral cyanosis, clubbing or edema.   Thereasa Solo, MD  06/25/2018, 10:00 AM

## 2018-06-27 ENCOUNTER — Ambulatory Visit: Payer: BLUE CROSS/BLUE SHIELD | Admitting: Physical Therapy

## 2018-06-27 ENCOUNTER — Encounter: Payer: Self-pay | Admitting: Physical Therapy

## 2018-06-27 DIAGNOSIS — M25512 Pain in left shoulder: Secondary | ICD-10-CM

## 2018-06-27 DIAGNOSIS — M25612 Stiffness of left shoulder, not elsewhere classified: Secondary | ICD-10-CM

## 2018-06-27 DIAGNOSIS — R293 Abnormal posture: Secondary | ICD-10-CM

## 2018-06-27 NOTE — Therapy (Signed)
[place], [place], [address] Phone: [phone]   Fax:  [phone]  Physical Therapy Treatment  Patient Details  Name: Madison Savage MRN: [id number] Date of Birth: Jul 18, 1971 Referring Provider (PT): Dr. Burr Medico    Encounter Date: 06/27/2018  PT End of Session - 06/27/18 1017    Visit Number  18    Number of Visits  31    Date for PT Re-Evaluation  07/08/18    PT Start Time  1017    PT Stop Time  1058    PT Time Calculation (min)  41 min    Activity Tolerance  Patient tolerated treatment well    Behavior During Therapy  [facility] for tasks assessed/performed       Past Medical History:  Diagnosis Date  . Anemia   . Anxiety   . Cancer ([place]) 03/2017   left breast cancer  . Chronic ITP (idiopathic thrombocytopenia) (HCC)   . Family history of breast cancer   . Family history of ovarian cancer   . Headache    last migraine...9/24  . Heart murmur    at birth.  none now  . MTHFR mutation [facility])     Past Surgical History:  Procedure Laterality Date  . AXILLARY LYMPH NODE DISSECTION Left 04/27/2017   Procedure: AXILLARY LYMPH NODE DISSECTION;  Surgeon: [name], MD;  Location: [place];  Service: General;  Laterality: Left;  . BILATERAL TOTAL MASTECTOMY WITH AXILLARY LYMPH NODE DISSECTION Bilateral 04/27/2017   Procedure: SKIN SPARING MASTECTOMY;  Surgeon: [name], MD;  Location: [place];  Service: General;  Laterality: Bilateral;  . BREAST CAPSULECTOMY WITH IMPLANT EXCHANGE Bilateral 12/06/2017   Procedure: BREAST CAPSULECTOMY WITH IMPLANT EXCHANGE;  Surgeon: Wallace Going, DO;  Location: [place];  Service: Plastics;  Laterality: Bilateral;  . BREAST RECONSTRUCTION WITH PLACEMENT OF TISSUE EXPANDER AND FLEX HD (ACELLULAR HYDRATED DERMIS) Bilateral 04/27/2017   Procedure: BILATERAL BREAST RECONSTRUCTION WITH PLACEMENT OF TISSUE EXPANDER AND  FLEX HD (ACELLULAR HYDRATED DERMIS);  Surgeon: Wallace Going, DO;  Location: [place];  Service: Plastics;  Laterality: Bilateral;  . BREAST SURGERY    . MASTECTOMY    . PLACEMENT OF BREAST IMPLANTS Bilateral 12/06/2017   Procedure: PLACEMENT OF BREAST IMPLANTS;  Surgeon: Wallace Going, DO;  Location: [place];  Service: Plastics;  Laterality: Bilateral;  . PORT-A-CATH REMOVAL Right 12/06/2017   Procedure: REMOVAL PORT-A-CATH;  Surgeon: Wallace Going, DO;  Location: [place];  Service: Plastics;  Laterality: Right;  . PORTACATH PLACEMENT Right 06/01/2017   Procedure: INSERTION PORT-A-CATH RIGHT SUBCLAVIAN;  Surgeon: [name], MD;  Location: [place];  Service: General;  Laterality: Right;  . WISDOM TOOTH EXTRACTION      There were no vitals filed for this visit.  Subjective Assessment - 06/27/18 1017    Subjective  "I have been feeling very stiff, im not sure if tis related to the implant and it feels sore where the incision are and along the rib"     Patient Stated Goals  to get rid of shoulder pain     Currently in Pain?  Yes    Pain Location  Shoulder    Pain Orientation  Left    Pain Descriptors / Indicators  Tightness    Pain Type  Chronic pain    Pain Onset  1 to 4 weeks ago    Pain Frequency  Intermittent    Aggravating Factors   reaching arm out to the side                       [facility] Adult PT Treatment/Exercise - 06/27/18 0001      Exercises   Exercises  Shoulder      Shoulder Exercises: Supine   Other Supine Exercises  shoulder flexion 1 x 10 in supine      Shoulder Exercises: Seated   Other Seated Exercises  seated thoracic extension with UE assist using bed sheet to promote self mobilization      Manual Therapy   Manual Therapy  Other (comment)    Manual therapy comments  skilled palpation and monitoring of pt throughout TPDN    Joint Mobilization  T1-T8 grade 3     Soft tissue  mobilization  IASTM along L upper trap/latissmus dorsi, proximal tricepand biceps brachi    Other Manual Therapy  tack and stretch of the pec major, and pec major MTPR along the pec major        Trigger Point Dry Needling - 06/27/18 1040    Consent Given?  Yes    Education Handout Provided  No    Muscles Treated Upper Body  --   Tricep on the L   Levator Scapulae Response  Twitch response elicited;Palpable increased muscle length    Rhomboids Response  Twitch response elicited;Palpable increased muscle length    Subscapularis Response  Twitch response elicited;Palpable increased muscle length                PT Long Term Goals - 05/15/18 1245      PT LONG TERM GOAL #1   Title  Pt will report that the pain and discomfort in left shoulder has decreased by 50%     Baseline  Pain decreased by 80%    Status  Achieved      PT LONG TERM GOAL #2   Title  Pt will be independent in upgraded HEP for scapular strength and ROM     Baseline  Pt continues to have decreased strength in right scapular muscles with decreased muscle bulk compared to left side     Time  8    Period  Weeks    Status  On-going      PT LONG TERM GOAL #3   Title  Increase left shoulder AROM flexion to >/= 155 degrees to increase ease with reaching overhead.    Baseline  135 degrees today 03/27/18, ~ 145 on 05/15/2018     Time  8    Period  Weeks    Status  On-going      PT LONG TERM GOAL #4   Title  Patient will report >/= 50% decrease in axillary tightness.    Time  8    Period  Weeks    Status  On-going            Plan - 06/27/18 1250    Clinical Impression Statement  Avaley reports intermittent stiffness inthe shoulder but denies pain and demonstrates good functional ROM. cotninued TPDN along the bicep/ tricp, rhomboids and latissmus on the L followed wiht IASTM and mobs. she was able to do all exercises today, plan to see pt back in 2 weeks to assess progress without PT and have her resume  independent exercise/ stretching to determine need for more PT / treatment.     Rehab Potential  Good  PT Treatment/Interventions  Therapeutic exercise;Patient/family education;ADLs/Self Care Home Management;Taping;DME Instruction;Dry needling;Functional mobility training;Therapeutic activities;Manual lymph drainage;Manual techniques;Passive range of motion;Electrical Stimulation;Other (comment)    PT Next Visit Plan  response to DN for trigger points with stretching, manual work and progression of home exercise as indicated .  check compressio sleeve if needed     PT Home Exercise Plan  Post op shoulder ROM HEP    Consulted and Agree with Plan of Care  Patient       Patient will benefit from skilled therapeutic intervention in order to improve the following deficits and impairments:  Decreased range of motion, Pain, Postural dysfunction, Increased fascial restricitons, Impaired perceived functional ability  Visit Diagnosis: Stiffness of left shoulder, not elsewhere classified  Acute pain of left shoulder  Abnormal posture     Problem List Patient Active Problem List   Diagnosis Date Noted  . Port-A-Cath in place 08/18/2017  . Genetic testing 04/17/2017  . BRCA2 gene mutation positive 04/17/2017  . Family history of breast cancer   . Family history of ovarian cancer   . 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 Cough 03/10/2014  . Esophagitis 03/10/2014  . Chronic ITP (idiopathic thrombocytopenia) (HCC) 03/10/2014  . ALLERGIC REACTION, ACUTE 07/22/2008   Starr Lake PT, DPT, LAT, ATC  06/27/18  12:54 PM      [facility] [address] [place], [place], [address] Phone: [phone]   Fax:  [phone]  Name: NAJA APPERSON MRN: [id number] Date of Birth: [birth year]

## 2018-07-09 ENCOUNTER — Ambulatory Visit: Payer: BLUE CROSS/BLUE SHIELD | Attending: Hematology | Admitting: Physical Therapy

## 2018-07-09 ENCOUNTER — Encounter: Payer: Self-pay | Admitting: Physical Therapy

## 2018-07-09 DIAGNOSIS — R293 Abnormal posture: Secondary | ICD-10-CM | POA: Diagnosis not present

## 2018-07-09 DIAGNOSIS — M25512 Pain in left shoulder: Secondary | ICD-10-CM | POA: Diagnosis not present

## 2018-07-09 DIAGNOSIS — M25612 Stiffness of left shoulder, not elsewhere classified: Secondary | ICD-10-CM | POA: Diagnosis not present

## 2018-07-09 NOTE — Therapy (Signed)
[place], [place], [address] Phone: [phone]   Fax:  [phone]  Physical Therapy Treatment / Re-certification  Patient Details  Name: Madison Savage MRN: [id number] Date of Birth: 1970/12/26 Referring Provider (PT): Dr. Burr Medico    Encounter Date: 07/09/2018  PT End of Session - 07/09/18 1636    Visit Number  19    Number of Visits  31    Date for PT Re-Evaluation  08/13/18    PT Start Time  1635    PT Stop Time  1715    PT Time Calculation (min)  40 min    Activity Tolerance  Patient tolerated treatment well    Behavior During Therapy  [facility] for tasks assessed/performed       Past Medical History:  Diagnosis Date  . Anemia   . Anxiety   . Cancer ([place]) 03/2017   left breast cancer  . Chronic ITP (idiopathic thrombocytopenia) (HCC)   . Family history of breast cancer   . Family history of ovarian cancer   . Headache    last migraine...9/24  . Heart murmur    at birth.  none now  . MTHFR mutation [facility])     Past Surgical History:  Procedure Laterality Date  . AXILLARY LYMPH NODE DISSECTION Left 04/27/2017   Procedure: AXILLARY LYMPH NODE DISSECTION;  Surgeon: [name], MD;  Location: [place];  Service: General;  Laterality: Left;  . BILATERAL TOTAL MASTECTOMY WITH AXILLARY LYMPH NODE DISSECTION Bilateral 04/27/2017   Procedure: SKIN SPARING MASTECTOMY;  Surgeon: [name], MD;  Location: [place];  Service: General;  Laterality: Bilateral;  . BREAST CAPSULECTOMY WITH IMPLANT EXCHANGE Bilateral 12/06/2017   Procedure: BREAST CAPSULECTOMY WITH IMPLANT EXCHANGE;  Surgeon: Wallace Going, DO;  Location: [place];  Service: Plastics;  Laterality: Bilateral;  . BREAST RECONSTRUCTION WITH PLACEMENT OF TISSUE EXPANDER AND FLEX HD (ACELLULAR HYDRATED DERMIS) Bilateral 04/27/2017   Procedure: BILATERAL BREAST RECONSTRUCTION WITH PLACEMENT OF  TISSUE EXPANDER AND FLEX HD (ACELLULAR HYDRATED DERMIS);  Surgeon: Wallace Going, DO;  Location: [place];  Service: Plastics;  Laterality: Bilateral;  . BREAST SURGERY    . MASTECTOMY    . PLACEMENT OF BREAST IMPLANTS Bilateral 12/06/2017   Procedure: PLACEMENT OF BREAST IMPLANTS;  Surgeon: Wallace Going, DO;  Location: [place];  Service: Plastics;  Laterality: Bilateral;  . PORT-A-CATH REMOVAL Right 12/06/2017   Procedure: REMOVAL PORT-A-CATH;  Surgeon: Wallace Going, DO;  Location: [place];  Service: Plastics;  Laterality: Right;  . PORTACATH PLACEMENT Right 06/01/2017   Procedure: INSERTION PORT-A-CATH RIGHT SUBCLAVIAN;  Surgeon: [name], MD;  Location: [place];  Service: General;  Laterality: Right;  . WISDOM TOOTH EXTRACTION      There were no vitals filed for this visit.  Subjective Assessment - 07/09/18 1637    Subjective  " the pec was tight since the last session'and I noticed more cording along my ribs,     Patient Stated Goals  to get rid of shoulder pain     Currently in Pain?  Yes         [facility] PT Assessment - 07/09/18 1638      AROM   Left Shoulder Extension  52 Degrees    Left Shoulder Flexion  151 Degrees    Left Shoulder ABduction  134 Degrees      Strength   Overall Strength  Comments  overall 4/5 in all motions                   [facility] Adult PT Treatment/Exercise - 07/09/18 0001      Shoulder Exercises: Standing   Other Standing Exercises  lower trap activaiton with red theraband around wrist and wall slides      Shoulder Exercises: Stretch   Corner Stretch  2 reps;30 seconds   hands lowered to incorporate upper pec stretch   Other Shoulder Stretches  tricep stretch 2 x 30 sec      Manual Therapy   Manual Therapy  Other (comment);Taping    Manual therapy comments  skilled palpation and monitoring of pt throughout TPDN    Joint Mobilization  T1-T8 grade 3     Soft  tissue mobilization  IASTM along L upper trap/latissmus dorsi, proximal tricepand biceps brachi    Other Manual Therapy  tack and stretch of the pec major, and pec major MTPR along the pec major     McConnell  L shoulder inhibition taping       Trigger Point Dry Needling - 07/09/18 1724    Consent Given?  Yes    Education Handout Provided  No   given previously   Muscles Treated Upper Body  Supraspinatus    Upper Trapezius Response  Palpable increased muscle length;Twitch reponse elicited    Pectoralis Major Response  Twitch response elicited;Palpable increased muscle length   distal aspect only    Levator Scapulae Response  Twitch response elicited;Palpable increased muscle length    Supraspinatus Response  Twitch response elicited;Palpable increased muscle length    Subscapularis Response  Twitch response elicited;Palpable increased muscle length           PT Education - 07/09/18 1725    Education Details  reviewed previously provided HEP and reissued red theraband    Person(s) Educated  Patient    Methods  Explanation;Verbal cues;Handout;Demonstration    Comprehension  Verbalized understanding;Verbal cues required;Returned demonstration          PT Long Term Goals - 07/09/18 1723      PT LONG TERM GOAL #1   Title  Pt will report that the pain and discomfort in left shoulder has decreased by 50%     Time  8    Period  Weeks    Status  Achieved      PT LONG TERM GOAL #2   Title  Pt will be independent in upgraded HEP for scapular strength and ROM     Time  8    Period  Weeks    Status  On-going      PT LONG TERM GOAL #3   Title  Increase left shoulder AROM flexion to >/= 155 degrees to increase ease with reaching overhead.    Time  8    Period  Weeks    Status  Achieved      PT LONG TERM GOAL #4   Title  Patient will report >/= 50% decrease in axillary tightness.    Time  8    Period  Weeks    Status  On-going            Plan - 07/09/18 1726     Clinical Impression Statement  Madison Savage reports increased soreness / stiffness in the pec and reports "cording" in the lower ribs/ abdominal region. she is making progress with ROM increasing flexion to 151 degrees, but did demonstrate regression of abduction more  due to soreness. she conitnues to make good progress toward long term goals. continued TPDN followed with IASTM techniques and inhibition taping. she would benefit from more physical therapy to improve ROm and stiffness, meet remaining long term goals and work toward independent exercise.     PT Frequency  1x / week    PT Duration  3 weeks    PT Treatment/Interventions  Therapeutic exercise;Patient/family education;ADLs/Self Care Home Management;Taping;DME Instruction;Dry needling;Functional mobility training;Therapeutic activities;Manual lymph drainage;Manual techniques;Passive range of motion;Electrical Stimulation;Other (comment)    PT Next Visit Plan  response to DN for trigger points with stretching, manual work and progression of home exercise as indicated .  check compressio sleeve if needed     PT Home Exercise Plan  Post op shoulder ROM HEP    Consulted and Agree with Plan of Care  Patient       Patient will benefit from skilled therapeutic intervention in order to improve the following deficits and impairments:  Decreased range of motion, Pain, Postural dysfunction, Increased fascial restricitons, Impaired perceived functional ability  Visit Diagnosis: Stiffness of left shoulder, not elsewhere classified  Acute pain of left shoulder  Abnormal posture     Problem List Patient Active Problem List   Diagnosis Date Noted  . Port-A-Cath in place 08/18/2017  . Genetic testing 04/17/2017  . BRCA2 gene mutation positive 04/17/2017  . Family history of breast cancer   . Family history of ovarian cancer   . 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 Cough 03/10/2014  . Esophagitis  03/10/2014  . Chronic ITP (idiopathic thrombocytopenia) (HCC) 03/10/2014  . ALLERGIC REACTION, ACUTE 07/22/2008   Starr Lake PT, DPT, LAT, ATC  07/09/18  5:31 PM      [facility] [address] [place], [place], [address] Phone: [phone]   Fax:  [phone]  Name: Madison Savage MRN: [id number] Date of Birth: [birth year]

## 2018-07-11 DIAGNOSIS — Z1501 Genetic susceptibility to malignant neoplasm of breast: Secondary | ICD-10-CM | POA: Diagnosis not present

## 2018-07-11 DIAGNOSIS — D693 Immune thrombocytopenic purpura: Secondary | ICD-10-CM | POA: Diagnosis not present

## 2018-07-11 DIAGNOSIS — Z9013 Acquired absence of bilateral breasts and nipples: Secondary | ICD-10-CM | POA: Diagnosis not present

## 2018-07-11 DIAGNOSIS — Z9889 Other specified postprocedural states: Secondary | ICD-10-CM | POA: Diagnosis not present

## 2018-07-20 ENCOUNTER — Encounter

## 2018-07-20 ENCOUNTER — Ambulatory Visit: Payer: BLUE CROSS/BLUE SHIELD | Admitting: Physical Therapy

## 2018-07-20 ENCOUNTER — Encounter: Payer: Self-pay | Admitting: Physical Therapy

## 2018-07-20 DIAGNOSIS — M25612 Stiffness of left shoulder, not elsewhere classified: Secondary | ICD-10-CM | POA: Diagnosis not present

## 2018-07-20 DIAGNOSIS — M25512 Pain in left shoulder: Secondary | ICD-10-CM | POA: Diagnosis not present

## 2018-07-20 DIAGNOSIS — R293 Abnormal posture: Secondary | ICD-10-CM

## 2018-07-20 NOTE — Therapy (Signed)
[facility] [address] [place], [place], [address] Phone: [phone]   Fax:  [phone]  Physical Therapy Treatment  Patient Details  Name: Madison Savage MRN: [id number] Date of Birth: 03/30/1971 Referring Provider (PT): Dr. Feng    Encounter Date: 07/20/2018  PT End of Session - 07/20/18 1103    Visit Number  20    Number of Visits  31    Date for PT Re-Evaluation  08/13/18    PT Start Time  1101    PT Stop Time  1148    PT Time Calculation (min)  47 min    Activity Tolerance  Patient tolerated treatment well    Behavior During Therapy  WFL for tasks assessed/performed       Past Medical History:  Diagnosis Date  . Anemia   . Anxiety   . Cancer (HCC) 03/2017   left breast cancer  . Chronic ITP (idiopathic thrombocytopenia) (HCC)   . Family history of breast cancer   . Family history of ovarian cancer   . Headache    last migraine...9/24  . Heart murmur    at birth.  none now  . MTHFR mutation (HCC)     Past Surgical History:  Procedure Laterality Date  . AXILLARY LYMPH NODE DISSECTION Left 04/27/2017   Procedure: AXILLARY LYMPH NODE DISSECTION;  Surgeon: Byerly, Faera, MD;  Location: [facility];  Service: General;  Laterality: Left;  . BILATERAL TOTAL MASTECTOMY WITH AXILLARY LYMPH NODE DISSECTION Bilateral 04/27/2017   Procedure: SKIN SPARING MASTECTOMY;  Surgeon: Byerly, Faera, MD;  Location: [facility];  Service: General;  Laterality: Bilateral;  . BREAST CAPSULECTOMY WITH IMPLANT EXCHANGE Bilateral 12/06/2017   Procedure: BREAST CAPSULECTOMY WITH IMPLANT EXCHANGE;  Surgeon: Dillingham, Claire S, DO;  Location: [facility];  Service: Plastics;  Laterality: Bilateral;  . BREAST RECONSTRUCTION WITH PLACEMENT OF TISSUE EXPANDER AND FLEX HD (ACELLULAR HYDRATED DERMIS) Bilateral 04/27/2017   Procedure: BILATERAL BREAST RECONSTRUCTION WITH PLACEMENT OF TISSUE EXPANDER AND  FLEX HD (ACELLULAR HYDRATED DERMIS);  Surgeon: Dillingham, Claire S, DO;  Location: [facility];  Service: Plastics;  Laterality: Bilateral;  . BREAST SURGERY    . MASTECTOMY    . PLACEMENT OF BREAST IMPLANTS Bilateral 12/06/2017   Procedure: PLACEMENT OF BREAST IMPLANTS;  Surgeon: Dillingham, Claire S, DO;  Location: [facility];  Service: Plastics;  Laterality: Bilateral;  . PORT-A-CATH REMOVAL Right 12/06/2017   Procedure: REMOVAL PORT-A-CATH;  Surgeon: Dillingham, Claire S, DO;  Location: [facility];  Service: Plastics;  Laterality: Right;  . PORTACATH PLACEMENT Right 06/01/2017   Procedure: INSERTION PORT-A-CATH RIGHT SUBCLAVIAN;  Surgeon: Byerly, Faera, MD;  [facility];  Service: General;  Laterality: Right;  . WISDOM TOOTH EXTRACTION      There were no vitals filed for this visit.  Subjective Assessment - 07/20/18 1103    Subjective  "the shoulder isn't bad its the pec still"     Patient Stated Goals  to get rid of shoulder pain     Currently in Pain?  Yes    Pain Score  1     Pain Location  Shoulder    Pain Orientation  Left    Pain Descriptors / Indicators  Tightness    Pain Type  Chronic pain    Pain Onset  1 to 4 weeks ago    Pain Frequency  Intermittent    Aggravating Factors   reaching arm   out to the side                       [facility] Adult PT Treatment/Exercise - 07/20/18 0001      Shoulder Exercises: Stretch   Corner Stretch  2 reps;30 seconds    Other Shoulder Stretches  tricep stretch 2 x 30 sec      Manual Therapy   Manual therapy comments  skilled palpation and monitoring of pt throughout TPDN    Joint Mobilization  T1-T8 grade 3 PA, PA grade 3 L costovertebral mobs    Soft tissue mobilization  IASTM along L upper trap/latissmus dorsi, proximal tricepand biceps brachi    McConnell  L shoulder inhibition taping       Trigger Point Dry Needling - 07/20/18 1108    Consent Given?  Yes  (Pended)      Education Handout Provided  No  (Pended)     Muscles Treated Upper Body  Subscapularis  (Pended)    latissmus dorsi   Upper Trapezius Response  Twitch reponse elicited;Palpable increased muscle length  (Pended)     Levator Scapulae Response  Twitch response elicited;Palpable increased muscle length  (Pended)     Supraspinatus Response  Twitch response elicited;Palpable increased muscle length  (Pended)     Subscapularis Response  Twitch response elicited;Palpable increased muscle length  (Pended)            PT Education - 07/20/18 1225    Education provided  Yes    Education Details  updated HEP for self shoulder ER mob/ ROM    Person(s) Educated  Patient    Methods  Explanation;Verbal cues;Demonstration    Comprehension  Verbalized understanding;Verbal cues required;Returned demonstration          PT Long Term Goals - 07/09/18 1723      PT LONG TERM GOAL #1   Title  Pt will report that the pain and discomfort in left shoulder has decreased by 50%     Time  8    Period  Weeks    Status  Achieved      PT LONG TERM GOAL #2   Title  Pt will be independent in upgraded HEP for scapular strength and ROM     Time  8    Period  Weeks    Status  On-going      PT LONG TERM GOAL #3   Title  Increase left shoulder AROM flexion to >/= 155 degrees to increase ease with reaching overhead.    Time  8    Period  Weeks    Status  Achieved      PT LONG TERM GOAL #4   Title  Patient will report >/= 50% decrease in axillary tightness.    Time  8    Period  Weeks    Status  On-going            Plan - 07/20/18 1220    Clinical Impression Statement  pt notes improved shoulder mobility but continues to report stiffness in the pec and capulse tightness in relation to the implant that she feels is causing more issue with pec stiffness. focused on manual techninques today and conitnued with TPDN focusing on peri-scapular muscles, and utilized STW to calm down soreness. trialed  costoverteral mobs on the L and ghj mobs to promote mobility and reduce stiffness.     PT Treatment/Interventions  Therapeutic exercise;Patient/family education;ADLs/Self Care Home Management;Taping;DME Instruction;Dry needling;Functional mobility training;Therapeutic   activities;Manual lymph drainage;Manual techniques;Passive range of motion;Electrical Stimulation;Other (comment)    PT Next Visit Plan  response to DN for trigger points with stretching, how were rib mobs, manual work and progression of home exercise as indicated .  check compressio sleeve if needed     PT Home Exercise Plan  Post op shoulder ROM HEP    Consulted and Agree with Plan of Care  Patient       Patient will benefit from skilled therapeutic intervention in order to improve the following deficits and impairments:     Visit Diagnosis: Stiffness of left shoulder, not elsewhere classified  Acute pain of left shoulder  Abnormal posture     Problem List Patient Active Problem List   Diagnosis Date Noted  . Port-A-Cath in place 08/18/2017  . Genetic testing 04/17/2017  . BRCA2 gene mutation positive 04/17/2017  . Family history of breast cancer   . Family history of ovarian cancer   . 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 Cough 03/10/2014  . Esophagitis 03/10/2014  . Chronic ITP (idiopathic thrombocytopenia) (HCC) 03/10/2014  . ALLERGIC REACTION, ACUTE 07/22/2008   Starr Lake PT, DPT, LAT, ATC  07/20/18  12:31 PM      [facility] [address] [place], [place], [address] Phone: [phone]   Fax:  [phone]  Name: Madison Savage MRN: [id number] Date of Birth: Feb 14, 1971

## 2018-07-23 ENCOUNTER — Encounter: Payer: Self-pay | Admitting: Hematology

## 2018-07-24 ENCOUNTER — Encounter: Payer: Self-pay | Admitting: Plastic Surgery

## 2018-07-24 ENCOUNTER — Ambulatory Visit: Payer: BLUE CROSS/BLUE SHIELD | Admitting: Plastic Surgery

## 2018-07-24 ENCOUNTER — Ambulatory Visit: Payer: BLUE CROSS/BLUE SHIELD | Admitting: Physical Therapy

## 2018-07-24 VITALS — BP 110/66 | HR 66 | Ht 68.0 in | Wt 118.0 lb

## 2018-07-24 DIAGNOSIS — Z9013 Acquired absence of bilateral breasts and nipples: Secondary | ICD-10-CM | POA: Diagnosis not present

## 2018-07-24 DIAGNOSIS — Z9889 Other specified postprocedural states: Secondary | ICD-10-CM

## 2018-07-24 DIAGNOSIS — Z1501 Genetic susceptibility to malignant neoplasm of breast: Secondary | ICD-10-CM | POA: Diagnosis not present

## 2018-07-24 DIAGNOSIS — C50412 Malignant neoplasm of upper-outer quadrant of left female breast: Secondary | ICD-10-CM

## 2018-07-24 DIAGNOSIS — Z9882 Breast implant status: Secondary | ICD-10-CM | POA: Diagnosis not present

## 2018-07-24 DIAGNOSIS — Z1509 Genetic susceptibility to other malignant neoplasm: Secondary | ICD-10-CM

## 2018-07-24 NOTE — Progress Notes (Signed)
Patient ID: Madison Savage, female    DOB: 03/28/1971, 47 y.o.   MRN: [id number]   Chief Complaint  Patient presents with  . Breast Problem    The patient is a 47 year old white female here for follow-up on her bilateral breast reconstruction.  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  She also found out that she is BRCA2 positive and she had axillary nodes involved.  For this reason she had a bilateral mastectomies with expander placement followed by implant placement with Mentor smooth round high profile gel 300 cc implants 4/19.  She then underwent left chest radiation that was completed June 2019.  She is 5 feet 7 inches tall, 117 pounds.  She has been very diligent with massage, range of motion and exercise to improve her mobility in the left arm and shoulder.  She also does dry needling.  She is concerned that there may be an increase in contracture on the left side and on exam likely has a grade 2 contracture of the capsule.  It is not causing pain at this time and the overall symmetry is reasonable.  On the right medial breast there is a slight bit of fat and skin redundancy which she would like to have modified and I believe this is very reasonable and safe.       Review of Systems  Constitutional: Negative.   HENT: Negative.   Eyes: Negative.   Respiratory: Negative.   Cardiovascular: Negative.   Endocrine: Negative.   Genitourinary: Negative.   Musculoskeletal: Negative.   Hematological: Negative.   Psychiatric/Behavioral: Negative.     Past Medical History:  Diagnosis Date  . Anemia   . Anxiety   . Cancer ([place]) 03/2017   left breast cancer  . Chronic ITP (idiopathic thrombocytopenia) (HCC)   . Family history of breast cancer   . Family history of ovarian cancer   . Headache    last migraine...9/24  . Heart murmur    at birth.   none now  . MTHFR mutation [facility])     Past Surgical History:  Procedure Laterality Date  . AXILLARY LYMPH NODE DISSECTION Left 04/27/2017   Procedure: AXILLARY LYMPH NODE DISSECTION;  Surgeon: [name], MD;  Location: [place];  Service: General;  Laterality: Left;  . BILATERAL TOTAL MASTECTOMY WITH AXILLARY LYMPH NODE DISSECTION Bilateral 04/27/2017   Procedure: SKIN SPARING MASTECTOMY;  Surgeon: [name], MD;  Location: [place];  Service: General;  Laterality: Bilateral;  . BREAST CAPSULECTOMY WITH IMPLANT EXCHANGE Bilateral 12/06/2017   Procedure: BREAST CAPSULECTOMY WITH IMPLANT EXCHANGE;  Surgeon: Wallace Going, DO;  Location: [place];  Service: Plastics;  Laterality: Bilateral;  . BREAST RECONSTRUCTION WITH PLACEMENT OF TISSUE EXPANDER AND FLEX HD (ACELLULAR HYDRATED DERMIS) Bilateral 04/27/2017   Procedure: BILATERAL BREAST RECONSTRUCTION WITH PLACEMENT OF TISSUE EXPANDER AND FLEX HD (ACELLULAR HYDRATED DERMIS);  Surgeon: Wallace Going, DO;  Location: [place];  Service: Plastics;  Laterality: Bilateral;  . BREAST SURGERY    . MASTECTOMY    . PLACEMENT OF BREAST IMPLANTS Bilateral 12/06/2017   Procedure: PLACEMENT OF BREAST IMPLANTS;  Surgeon: Wallace Going, DO;  Location: [place];  Service: Plastics;  Laterality: Bilateral;  . PORT-A-CATH REMOVAL Right 12/06/2017   Procedure: REMOVAL PORT-A-CATH;  Surgeon: Wallace Going, DO;  Location: [facility];  Service: Plastics;  Laterality: Right;  . PORTACATH PLACEMENT Right 06/01/2017   Procedure: INSERTION PORT-A-CATH RIGHT SUBCLAVIAN;  Surgeon: [name], MD;  Location: [place];  Service: General;  Laterality: Right;  . WISDOM TOOTH EXTRACTION        Current Outpatient Medications:  .  ALPRAZolam (XANAX) 0.25 MG tablet, Take 1 tablet (0.25 mg total) by mouth at bedtime as needed for anxiety. (Patient not  taking: Reported on 06/25/2018), Disp: 20 tablet, Rfl: 0 .  b complex vitamins capsule, Take 1 capsule by mouth daily., Disp: , Rfl:  .  Cyanocobalamin POWD, Take by mouth., Disp: , Rfl:  .  Multiple Vitamins-Calcium (ONE-A-DAY WOMENS PO), Take 1 tablet by mouth daily., Disp: , Rfl:  .  OVER THE COUNTER MEDICATION, Take 1 capsule by mouth daily. Biotin/MSM, Disp: , Rfl:  .  tamoxifen (NOLVADEX) 20 MG tablet, TAKE 1 TABLET BY MOUTH EVERY DAY, Disp: 90 tablet, Rfl: 0 No current facility-administered medications for this visit.   Facility-Administered Medications Ordered in Other Visits:  .  sodium chloride flush (NS) 0.9 % injection 10 mL, 10 mL, Intravenous, PRN, [name], MD, 10 mL at 07/21/17 1323   Objective:   Vitals:   07/24/18 1041  BP: 110/66  Pulse: 66  SpO2: 98%    Physical Exam  Constitutional: She appears well-developed and well-nourished.  HENT:  Head: Normocephalic and atraumatic.  Eyes: Pupils are equal, round, and reactive to light. EOM are normal.  Cardiovascular: Normal rate.  Pulmonary/Chest: Effort normal. No respiratory distress.  Abdominal: Soft. She exhibits no distension.  Neurological: She is alert.  Psychiatric: She has a normal mood and affect. Her behavior is normal. Judgment and thought content normal.    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  Recommend waiting on any intervention for the left side until she is further out from the radiation at a minimum of 6 months but more likely a year. We did discuss potential options for modifying the left breast.  One is changing the implant at doing a capsulectomy to try to lower the implant.  This would work however very concerned that she may not heal the skin afterwards and the  second option would be to plan on a latissimus flap which she has known with the more ideal option from the very beginning when we found out she was going to get radiation  [place], DO

## 2018-07-25 ENCOUNTER — Other Ambulatory Visit: Payer: Self-pay

## 2018-07-25 DIAGNOSIS — C50412 Malignant neoplasm of upper-outer quadrant of left female breast: Secondary | ICD-10-CM

## 2018-07-25 MED ORDER — TAMOXIFEN CITRATE 20 MG PO TABS: 20.0000 mg | ORAL_TABLET | Freq: Every day | ORAL | 0 refills | Status: DC

## 2018-07-30 ENCOUNTER — Other Ambulatory Visit: Payer: Self-pay

## 2018-07-30 DIAGNOSIS — C50412 Malignant neoplasm of upper-outer quadrant of left female breast: Secondary | ICD-10-CM

## 2018-07-30 MED ORDER — TAMOXIFEN CITRATE 20 MG PO TABS: 20.0000 mg | ORAL_TABLET | Freq: Every day | ORAL | 0 refills | Status: DC

## 2018-07-31 ENCOUNTER — Encounter: Payer: Self-pay | Admitting: Physical Therapy

## 2018-07-31 ENCOUNTER — Ambulatory Visit: Payer: BLUE CROSS/BLUE SHIELD | Admitting: Physical Therapy

## 2018-07-31 DIAGNOSIS — R293 Abnormal posture: Secondary | ICD-10-CM

## 2018-07-31 DIAGNOSIS — M25512 Pain in left shoulder: Secondary | ICD-10-CM | POA: Diagnosis not present

## 2018-07-31 DIAGNOSIS — M25612 Stiffness of left shoulder, not elsewhere classified: Secondary | ICD-10-CM

## 2018-07-31 NOTE — Therapy (Signed)
[place], [place], [address] Phone: [phone]   Fax:  [phone]  Physical Therapy Treatment  Patient Details  Name: Madison Savage MRN: [id number] Date of Birth: 29-Dec-1970 Referring Provider (PT): Dr. Burr Medico    Encounter Date: 07/31/2018  PT End of Session - 07/31/18 1015    Visit Number  21    Number of Visits  31    Date for PT Re-Evaluation  08/13/18    PT Start Time  1016    PT Stop Time  1101    PT Time Calculation (min)  45 min    Activity Tolerance  Patient tolerated treatment well    Behavior During Therapy  [facility] for tasks assessed/performed       Past Medical History:  Diagnosis Date  . Anemia   . Anxiety   . Cancer ([place]) 03/2017   left breast cancer  . Chronic ITP (idiopathic thrombocytopenia) (HCC)   . Family history of breast cancer   . Family history of ovarian cancer   . Headache    last migraine...9/24  . Heart murmur    at birth.  none now  . MTHFR mutation [facility])     Past Surgical History:  Procedure Laterality Date  . AXILLARY LYMPH NODE DISSECTION Left 04/27/2017   Procedure: AXILLARY LYMPH NODE DISSECTION;  Surgeon: [name], MD;  Location: [place];  Service: General;  Laterality: Left;  . BILATERAL TOTAL MASTECTOMY WITH AXILLARY LYMPH NODE DISSECTION Bilateral 04/27/2017   Procedure: SKIN SPARING MASTECTOMY;  Surgeon: [name], MD;  Location: [place];  Service: General;  Laterality: Bilateral;  . BREAST CAPSULECTOMY WITH IMPLANT EXCHANGE Bilateral 12/06/2017   Procedure: BREAST CAPSULECTOMY WITH IMPLANT EXCHANGE;  Surgeon: Wallace Going, DO;  Location: [place];  Service: Plastics;  Laterality: Bilateral;  . BREAST RECONSTRUCTION WITH PLACEMENT OF TISSUE EXPANDER AND FLEX HD (ACELLULAR HYDRATED DERMIS) Bilateral 04/27/2017   Procedure: BILATERAL BREAST RECONSTRUCTION WITH PLACEMENT OF TISSUE EXPANDER AND  FLEX HD (ACELLULAR HYDRATED DERMIS);  Surgeon: Wallace Going, DO;  Location: [place];  Service: Plastics;  Laterality: Bilateral;  . BREAST SURGERY    . MASTECTOMY    . PLACEMENT OF BREAST IMPLANTS Bilateral 12/06/2017   Procedure: PLACEMENT OF BREAST IMPLANTS;  Surgeon: Wallace Going, DO;  Location: [place];  Service: Plastics;  Laterality: Bilateral;  . PORT-A-CATH REMOVAL Right 12/06/2017   Procedure: REMOVAL PORT-A-CATH;  Surgeon: Wallace Going, DO;  Location: [place];  Service: Plastics;  Laterality: Right;  . PORTACATH PLACEMENT Right 06/01/2017   Procedure: INSERTION PORT-A-CATH RIGHT SUBCLAVIAN;  Surgeon: [name], MD;  Location: [place];  Service: General;  Laterality: Right;  . WISDOM TOOTH EXTRACTION      There were no vitals filed for this visit.  Subjective Assessment - 07/31/18 1016    Subjective  "I am alittle stiff today, I have worked some of stiffness, my motion is way better"     Patient Stated Goals  to get rid of shoulder pain     Currently in Pain?  Yes    Pain Score  0-No pain    Pain Descriptors / Indicators  Tightness    Pain Onset  More than a month ago    Pain Frequency  Intermittent    Aggravating Factors   intermittent reaching  [place] Adult PT Treatment/Exercise - 07/31/18 0001      Shoulder Exercises: Seated   Other Seated Exercises  unweight shoulder press going to fatigue      Shoulder Exercises: Stretch   Other Shoulder Stretches  door way upper pec stretch with hands placed below wasit.       Manual Therapy   Manual therapy comments  skilled palpation and monitoring of pt throughout TPDN    Joint Mobilization  T1-T8 grade 3 PA, PA grade 3 L costovertebral mobs    Soft tissue mobilization  IASTM along L upper trap/latissmus dorsi, proximal tricepand biceps brachi    Other Manual Therapy  MTPR along pec major on the L    McConnell  L  shoulder inhibition taping       Trigger Point Dry Needling - 07/31/18 1104    Consent Given?  Yes    Education Handout Provided  No   given previously   Muscles Treated Upper Body  Longissimus;Quadratus Lumborum   QL on the L, L latissimus Dorsi   Longissimus Response  Twitch response elicited;Palpable increased muscle length           PT Education - 07/31/18 1107    Education provided  Yes    Education Details  stretching the upper pec keeping hand below waist using the door frame    Person(s) Educated  Patient    Methods  Explanation;Verbal cues    Comprehension  Verbalized understanding;Verbal cues required          PT Long Term Goals - 07/09/18 1723      PT LONG TERM GOAL #1   Title  Pt will report that the pain and discomfort in left shoulder has decreased by 50%     Time  8    Period  Weeks    Status  Achieved      PT LONG TERM GOAL #2   Title  Pt will be independent in upgraded HEP for scapular strength and ROM     Time  8    Period  Weeks    Status  On-going      PT LONG TERM GOAL #3   Title  Increase left shoulder AROM flexion to >/= 155 degrees to increase ease with reaching overhead.    Time  8    Period  Weeks    Status  Achieved      PT LONG TERM GOAL #4   Title  Patient will report >/= 50% decrease in axillary tightness.    Time  8    Period  Weeks    Status  On-going            Plan - 07/31/18 1107    Clinical Impression Statement  Madison Savage continues to improve shouler ROM especially reaching above her head. continued TPDN focusing on thoracic parapsinals at T7-T9, latissmus  and QL on the L followed with IASTM and stretching. pt reports improvement of pain and stifness today. plan to see pt for one more visit in the  to review exercise and have her follow up with oncology PT for discharge.     PT Next Visit Plan  response to DN for trigger points with stretching, how were rib mobs, manual work and progression of home exercise as  indicated .  check compressio sleeve if needed     PT Home Exercise Plan  Post op shoulder ROM HEP    Consulted and Agree with Plan of Care  Patient  Patient will benefit from skilled therapeutic intervention in order to improve the following deficits and impairments:     Visit Diagnosis: Stiffness of left shoulder, not elsewhere classified  Acute pain of left shoulder  Abnormal posture     Problem List Patient Active Problem List   Diagnosis Date Noted  . S/P mastectomy, bilateral 07/24/2018  . S/P breast reconstruction 07/24/2018  . Port-A-Cath in place 08/18/2017  . Genetic testing 04/17/2017  . BRCA2 gene mutation positive 04/17/2017  . Family history of breast cancer   . Family history of ovarian cancer   . 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 Cough 03/10/2014  . Esophagitis 03/10/2014  . Chronic ITP (idiopathic thrombocytopenia) (HCC) 03/10/2014  . ALLERGIC REACTION, ACUTE 07/22/2008    Starr Lake PT, DPT, LAT, ATC  07/31/18  11:11 AM      [facility] [address] [place], [place], [address] Phone: [phone]   Fax:  [phone]  Name: Madison Savage MRN: [id number] Date of Birth: [birth year]

## 2018-08-08 ENCOUNTER — Ambulatory Visit: Payer: BLUE CROSS/BLUE SHIELD | Attending: Family Medicine | Admitting: Physical Therapy

## 2018-08-08 ENCOUNTER — Encounter: Payer: Self-pay | Admitting: Physical Therapy

## 2018-08-08 DIAGNOSIS — M25512 Pain in left shoulder: Secondary | ICD-10-CM | POA: Diagnosis not present

## 2018-08-08 DIAGNOSIS — Z483 Aftercare following surgery for neoplasm: Secondary | ICD-10-CM | POA: Diagnosis not present

## 2018-08-08 DIAGNOSIS — M25612 Stiffness of left shoulder, not elsewhere classified: Secondary | ICD-10-CM | POA: Insufficient documentation

## 2018-08-08 DIAGNOSIS — R293 Abnormal posture: Secondary | ICD-10-CM | POA: Diagnosis not present

## 2018-08-08 NOTE — Therapy (Signed)
[place], [place], [address] Phone: [phone]   Fax:  [phone]  Physical Therapy Treatment  Patient Details  Name: Madison Savage MRN: [id number] Date of Birth: Oct 08, 1970 Referring Provider (PT): Dr. Burr Medico    Encounter Date: 08/08/2018  PT End of Session - 08/08/18 0932    Visit Number  22    Number of Visits  31    Date for PT Re-Evaluation  08/13/18    PT Start Time  0846    PT Stop Time  0930    PT Time Calculation (min)  44 min    Activity Tolerance  Patient tolerated treatment well    Behavior During Therapy  [facility] for tasks assessed/performed       Past Medical History:  Diagnosis Date  . Anemia   . Anxiety   . Cancer ([place]) 03/2017   left breast cancer  . Chronic ITP (idiopathic thrombocytopenia) (HCC)   . Family history of breast cancer   . Family history of ovarian cancer   . Headache    last migraine...9/24  . Heart murmur    at birth.  none now  . MTHFR mutation [facility])     Past Surgical History:  Procedure Laterality Date  . AXILLARY LYMPH NODE DISSECTION Left 04/27/2017   Procedure: AXILLARY LYMPH NODE DISSECTION;  Surgeon: [name], MD;  Location: [place];  Service: General;  Laterality: Left;  . BILATERAL TOTAL MASTECTOMY WITH AXILLARY LYMPH NODE DISSECTION Bilateral 04/27/2017   Procedure: SKIN SPARING MASTECTOMY;  Surgeon: [name], MD;  Location: [place];  Service: General;  Laterality: Bilateral;  . BREAST CAPSULECTOMY WITH IMPLANT EXCHANGE Bilateral 12/06/2017   Procedure: BREAST CAPSULECTOMY WITH IMPLANT EXCHANGE;  Surgeon: Wallace Going, DO;  Location: [place];  Service: Plastics;  Laterality: Bilateral;  . BREAST RECONSTRUCTION WITH PLACEMENT OF TISSUE EXPANDER AND FLEX HD (ACELLULAR HYDRATED DERMIS) Bilateral 04/27/2017   Procedure: BILATERAL BREAST RECONSTRUCTION WITH PLACEMENT OF TISSUE EXPANDER AND  FLEX HD (ACELLULAR HYDRATED DERMIS);  Surgeon: Wallace Going, DO;  Location: [place];  Service: Plastics;  Laterality: Bilateral;  . BREAST SURGERY    . MASTECTOMY    . PLACEMENT OF BREAST IMPLANTS Bilateral 12/06/2017   Procedure: PLACEMENT OF BREAST IMPLANTS;  Surgeon: Wallace Going, DO;  Location: [place];  Service: Plastics;  Laterality: Bilateral;  . PORT-A-CATH REMOVAL Right 12/06/2017   Procedure: REMOVAL PORT-A-CATH;  Surgeon: Wallace Going, DO;  Location: [place];  Service: Plastics;  Laterality: Right;  . PORTACATH PLACEMENT Right 06/01/2017   Procedure: INSERTION PORT-A-CATH RIGHT SUBCLAVIAN;  Surgeon: [name], MD;  Location: [place];  Service: General;  Laterality: Right;  . WISDOM TOOTH EXTRACTION      There were no vitals filed for this visit.  Subjective Assessment - 08/08/18 0846    Subjective  "shoulder hasn't been bad, the pec is still pretty tight. It does improve throughout the day with stretching and manual release"    Patient Stated Goals  to get rid of shoulder pain     Currently in Pain?  Yes    Pain Score  0-No pain    Pain Location  Shoulder    Pain Orientation  Left    Pain Type  Chronic pain    Pain Onset  More than a month ago    Pain Frequency  Intermittent  [facility] PT Assessment - 08/08/18 0855      AROM   Left Shoulder Extension  64 Degrees    Left Shoulder Flexion  151 Degrees    Left Shoulder ABduction  136 Degrees    Left Shoulder Internal Rotation  --   T6   Left Shoulder External Rotation  --   T1     Strength   Overall Strength Comments  all motions 4/5                   [facility] Adult PT Treatment/Exercise - 08/08/18 0001      Self-Care   Self-Care  Posture    Posture  changing position of the purse to the R shoulder to reduce pec and subscapularis activation       Exercises   Exercises  Shoulder      Shoulder Exercises: Seated   External  Rotation  10 reps;Theraband    Other Seated Exercises  scapular retraction with ER with red theraband 2 x 15       Shoulder Exercises: Stretch   Other Shoulder Stretches  door way upper pec stretch with hands placed below wasit.  2 x 30 seconds    Other Shoulder Stretches  sub-scapularis stretch 2 x 30 hold    standing cues to keep shoulder     Manual Therapy   Manual therapy comments  MTPR along the R pec major/ minor, in supine with hands behind head MTPR along sub-scapularis             PT Education - 08/08/18 0931    Education provided  Yes    Education Details  updated HEP for shoulder strengthening and chaning arms with carrying her purse    Person(s) Educated  Patient    Methods  Explanation;Verbal cues    Comprehension  Verbalized understanding;Verbal cues required          PT Long Term Goals - 07/09/18 1723      PT LONG TERM GOAL #1   Title  Pt will report that the pain and discomfort in left shoulder has decreased by 50%     Time  8    Period  Weeks    Status  Achieved      PT LONG TERM GOAL #2   Title  Pt will be independent in upgraded HEP for scapular strength and ROM     Time  8    Period  Weeks    Status  On-going      PT LONG TERM GOAL #3   Title  Increase left shoulder AROM flexion to >/= 155 degrees to increase ease with reaching overhead.    Time  8    Period  Weeks    Status  Achieved      PT LONG TERM GOAL #4   Title  Patient will report >/= 50% decrease in axillary tightness.    Time  8    Period  Weeks    Status  On-going            Plan - 08/08/18 1009    Clinical Impression Statement  pt reports continued stiffness in the L shoulder but is maintaining her ROM compared to previous measures. some weakness noted with ER compared bil with pain, continued stretching for the sub-scapularis and pec major which she reported reduced stiffness at end of session. plan to send back to CX PT for potential D/C.     PT Next Visit Plan  response to DN for trigger points with stretching, how were rib mobs, manual work and progression of home exercise as indicated .  check compressio sleeve if needed     Consulted and Agree with Plan of Care  Patient       Patient will benefit from skilled therapeutic intervention in order to improve the following deficits and impairments:  Decreased range of motion, Pain, Postural dysfunction, Increased fascial restricitons, Impaired perceived functional ability  Visit Diagnosis: Stiffness of left shoulder, not elsewhere classified  Acute pain of left shoulder  Abnormal posture     Problem List Patient Active Problem List   Diagnosis Date Noted  . S/P mastectomy, bilateral 07/24/2018  . S/P breast reconstruction 07/24/2018  . Port-A-Cath in place 08/18/2017  . Genetic testing 04/17/2017  . BRCA2 gene mutation positive 04/17/2017  . Family history of breast cancer   . Family history of ovarian cancer   . 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 Cough 03/10/2014  . Esophagitis 03/10/2014  . Chronic ITP (idiopathic thrombocytopenia) (HCC) 03/10/2014  . ALLERGIC REACTION, ACUTE 07/22/2008   Starr Lake PT, DPT, LAT, ATC  08/08/18  10:14 AM      [facility] [address] [place], [place], [address] Phone: [phone]   Fax:  [phone]  Name: EMREY THORNLEY MRN: [id number] Date of Birth: [birth year]

## 2018-08-14 ENCOUNTER — Ambulatory Visit: Payer: BLUE CROSS/BLUE SHIELD | Admitting: Physical Therapy

## 2018-08-14 DIAGNOSIS — M25612 Stiffness of left shoulder, not elsewhere classified: Secondary | ICD-10-CM | POA: Diagnosis not present

## 2018-08-14 DIAGNOSIS — Z483 Aftercare following surgery for neoplasm: Secondary | ICD-10-CM

## 2018-08-14 DIAGNOSIS — M25512 Pain in left shoulder: Secondary | ICD-10-CM | POA: Diagnosis not present

## 2018-08-14 DIAGNOSIS — R293 Abnormal posture: Secondary | ICD-10-CM | POA: Diagnosis not present

## 2018-08-14 NOTE — Therapy (Signed)
[place], [place], [address] Phone: [phone]   Fax:  [phone]  Physical Therapy Treatment  Patient Details  Name: Madison Savage MRN: [id number] Date of Birth: [birth year] Referring Provider (PT): Dr. Burr Medico    Encounter Date: 08/14/2018  PT End of Session - 08/14/18 1243    Visit Number  23    Number of Visits  31    Date for PT Re-Evaluation  08/13/18    PT Start Time  1105    PT Stop Time  1155    PT Time Calculation (min)  50 min    Activity Tolerance  Patient tolerated treatment well    Behavior During Therapy  [facility] for tasks assessed/performed       Past Medical History:  Diagnosis Date  . Anemia   . Anxiety   . Cancer ([place]) 03/2017   left breast cancer  . Chronic ITP (idiopathic thrombocytopenia) (HCC)   . Family history of breast cancer   . Family history of ovarian cancer   . Headache    last migraine...9/24  . Heart murmur    at birth.  none now  . MTHFR mutation [facility])     Past Surgical History:  Procedure Laterality Date  . AXILLARY LYMPH NODE DISSECTION Left 04/27/2017   Procedure: AXILLARY LYMPH NODE DISSECTION;  Surgeon: [name], MD;  Location: [place];  Service: General;  Laterality: Left;  . BILATERAL TOTAL MASTECTOMY WITH AXILLARY LYMPH NODE DISSECTION Bilateral 04/27/2017   Procedure: SKIN SPARING MASTECTOMY;  Surgeon: [name], MD;  Location: [place];  Service: General;  Laterality: Bilateral;  . BREAST CAPSULECTOMY WITH IMPLANT EXCHANGE Bilateral 12/06/2017   Procedure: BREAST CAPSULECTOMY WITH IMPLANT EXCHANGE;  Surgeon: Wallace Going, DO;  Location: [place];  Service: Plastics;  Laterality: Bilateral;  . BREAST RECONSTRUCTION WITH PLACEMENT OF TISSUE EXPANDER AND FLEX HD (ACELLULAR HYDRATED DERMIS) Bilateral 04/27/2017   Procedure: BILATERAL BREAST RECONSTRUCTION WITH PLACEMENT OF TISSUE EXPANDER  AND FLEX HD (ACELLULAR HYDRATED DERMIS);  Surgeon: Wallace Going, DO;  Location: [name];  Service: Plastics;  Laterality: Bilateral;  . BREAST SURGERY    . MASTECTOMY    . PLACEMENT OF BREAST IMPLANTS Bilateral 12/06/2017   Procedure: PLACEMENT OF BREAST IMPLANTS;  Surgeon: Wallace Going, DO;  Location: [place];  Service: Plastics;  Laterality: Bilateral;  . PORT-A-CATH REMOVAL Right 12/06/2017   Procedure: REMOVAL PORT-A-CATH;  Surgeon: Wallace Going, DO;  Location: [place];  Service: Plastics;  Laterality: Right;  . PORTACATH PLACEMENT Right 06/01/2017   Procedure: INSERTION PORT-A-CATH RIGHT SUBCLAVIAN;  Surgeon: [name], MD;  Location: [place];  Service: General;  Laterality: Right;  . WISDOM TOOTH EXTRACTION      There were no vitals filed for this visit.  Subjective Assessment - 08/14/18 1236    Subjective  Pt reports she has been wearing the band above her left implant to keep it down a few hours a day and she thinks it is helping.  She has been doing her stretches and overall, feels better. Still has intermittent cording down her left arm and abdomen, but she knows strategies to keep is under control. She is doing self massage and rolling  She is having surgery to have her ovaries and tubes removed at the end of the month.  She feel that she is ready to discharge from PT  Pertinent History  Patient was diagnosed on 03/21/17 with left invasive lobular carcinoma breast cancer. It measures 2.1 cm and is located in the upper outer quadrant. She also has 6 abnormal axillary lymph nodes and a node was biopsied and found to be positive for carcinoma.   04/27/2017 .bilateral mastecomy with ALND ( 21 nodes removed) with immedicate expanders. She is has completed chemo, implant surgery and radiation  She will have a hysterectomy later this year.     Patient Stated Goals  to get rid of shoulder pain     Currently in Pain?   No/denies         [facility] PT Assessment - 08/14/18 0001      AROM   Left Shoulder Flexion  155 Degrees    Left Shoulder ABduction  155 Degrees                   [facility] Adult PT Treatment/Exercise - 08/14/18 0001      Shoulder Exercises: Standing   Other Standing Exercises  external rotation strengthening "walk outs" x 10 reps with each arm with blue theraband       Manual Therapy   Soft tissue mobilization  with patient in supine and sidelying and with this massage cream, soft tissue mobilization to  left axilla, tight pec major tendon, and mobilizaion to left implant                   PT Long Term Goals - 08/14/18 1148      PT LONG TERM GOAL #1   Title  Pt will report that the pain and discomfort in left shoulder has decreased by 50%     Status  Achieved      PT LONG TERM GOAL #2   Title  Pt will be independent in upgraded HEP for scapular strength and ROM     Status  Achieved      PT LONG TERM GOAL #3   Title  Increase left shoulder AROM flexion to >/= 155 degrees to increase ease with reaching overhead.    Status  Achieved      PT LONG TERM GOAL #4   Title  Patient will report >/= 50% decrease in axillary tightness.    Baseline  still has tightness and capsulare constriction, but has be 50%    Status  Achieved            Plan - 08/14/18 1244    Clinical Impression Statement  Madison Savage continues to have intermittent cording in her left axilla and and around her implant but has many strategies to manage her symptoms at home.  She continues to do multiple exercises at home and knows how to progress them to help manage her symptoms.  She is ready to discharge from PT with her goals paritally met.  She continues to have tightness in her left chest and axilla from implant capular contracture. She feels she can manage at home and knows to call for another prescription if she needs it     Clinical Impairments Affecting Rehab Potential  21 nodes removed  in left axilla     PT Next Visit Plan  discharge        Patient will benefit from skilled therapeutic intervention in order to improve the following deficits and impairments:  Decreased range of motion, Pain, Postural dysfunction, Increased fascial restricitons, Impaired perceived functional ability  Visit Diagnosis: No diagnosis found.     Problem List Patient Active  Problem List   Diagnosis Date Noted  . S/P mastectomy, bilateral 07/24/2018  . S/P breast reconstruction 07/24/2018  . Port-A-Cath in place 08/18/2017  . Genetic testing 04/17/2017  . BRCA2 gene mutation positive 04/17/2017  . Family history of breast cancer   . Family history of ovarian cancer   . 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 Cough 03/10/2014  . Esophagitis 03/10/2014  . Chronic ITP (idiopathic thrombocytopenia) (HCC) 03/10/2014  . ALLERGIC REACTION, ACUTE 07/22/2008     PHYSICAL THERAPY DISCHARGE SUMMARY  Visits from Start of Care: 23  Current functional level related to goals / functional outcomes: As above    Remaining deficits: As above    Education / Equipment: Home exercise, self treatment of cording and muscle tightness  Plan: Patient agrees to discharge.  Patient goals were not met. Patient is being discharged due to meeting the stated rehab goals.  ?????      Donato Heinz. Owens Shark PT  Madison Savage 08/14/2018, 12:48 PM  [place], [place], [address] Phone: [phone]   Fax:  [phone]  Name: Madison Savage MRN: [id number] Date of Birth: 10-27-1970

## 2018-08-15 NOTE — Progress Notes (Signed)
Madison Savage   [phone] Fax:([phone]) [phone]   Clinic Follow up Note   Patient Care Team: Dorena Cookey, MD as PCP - General [name], MD as Consulting Physician (General Surgery) [name], MD as Consulting Physician (Hematology) Kyung Rudd, MD as Consulting Physician (Radiation Oncology) Brien Few, MD as Consulting Physician (Obstetrics and Gynecology) Gardenia Phlegm, NP as Nurse Practitioner (Hematology and Oncology) 08/16/2018  CHIEF COMPLAINT: F/u on left breast cancer   SUMMARY OF ONCOLOGIC HISTORY: Oncology History   Cancer Staging Breast cancer metastasized to axillary lymph node, left ([place]) Staging form: Breast, AJCC 8th Edition - Clinical stage from 03/22/2017: Stage Unknown (cTX, cN1, cM0, GX, ER: Positive, PR: Positive, HER2: Negative) - Signed by [name], MD on 03/29/2017 - Pathologic stage from 04/27/2017: Stage IB (pT1c, pN2a, cM0, G2, ER: Positive, PR: Positive, HER2: Negative) - Signed by [name], MD on 05/25/2017 \     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 ([place])   03/21/2017 Mammogram    Diagnostic mammogram bilateral 03/21/17 IMPRESSION: INCOMPLETE - ADDITIONAL IMAGING EVALUATION NEEDED 1. The 0.6 cm oval mass in the right breast at 11 o'clock middle depth is indeterminate. An ultrasound is recommended.  2. The 0.5cm round focal asymmetry in the left breast central to the nipple middle depth is indeterminate. An ultrasound is recommended.  3. Ultrasound of the palpable abnormalities in the axilla and far left lateral breast is recommended.  4. Multiple clusters of pleomorphic calcifications in the left upper outer breast anterior depth spanning a 2.5 cm area are highly suggestive of malignancy. A stereotactic biopsy is recommended.      03/21/2017 Imaging    US Breast bilateral 03/21/17 IMPRESSION: HIGHLY SUGGESTIVE OF MALIGNANCY 1. Six distinct abnormal left axillary nodes, 2 of  which correspond to the palpable lumps felt by the patient. Findings concerning for metastatic adenopathy. Ultrasound guided biopsy of one of these nodes is recommended.  2. Small 5 mm palpable, oval mass in the left breast 3:00 areolar margin is at an intermediate suspicion. Ultrasound guided biopsy is recommended.  3. Isoechoic 8 mm mass in the right breast 10:00 5 cm from the nipple is also an intermediate suspicion for malignancy, and ultrasound guided biopsy is recommended.  4. Stereotactic guided biopsy for the highly suspicious calcifications in the left breast also recommended as detailed in the mammography report.      03/22/2017 Initial Biopsy    Diagnosis 03/22/17 Lymph node, needle/core biopsy, left axillary node -METASTATIC CARCINOMA, SEE COMMENT Microscopic Comment    03/22/2017 Receptors her2    Lymph node biopsy ER 95% positive, strong staining, PR 5% positive, weak staining, HER-2 negative, with HER2/CEP17 ratio 1.23 and copy #3.95.    03/27/2017 Pathology Results    Diagnosis 03/27/17 1. Breast, left, needle core biopsy -FIBROADENOMATOID NODULE WITH CALCIFICATIONS 2.Breast, left, needle core biopsy -DUCTAL CARCINOMA IN SITU, HIGH GRADE -SEE COMMENT  3. Breast, right, needle, core biopsy -FIBROADENOMA -NO MALIGNANCY IDENTIFIED     03/29/2017 Initial Diagnosis    Breast cancer metastasized to axillary lymph node, left ([place])    04/07/2017 Imaging    CT CAP 04/07/17 IMPRESSION: 1. Several prominent left axillary lymph nodes which may correspond to biopsy proven axillary lymph node metastasis. 2. No thoracic adenopathy identified or evidence of distant metastatic disease.    04/07/2017 Imaging    Bone whole body scan 04/07/17 IMPRESSION: Negative for evidence of osseous metastatic disease.    04/17/2017 Genetic Testing  BRCA2 c.[id number] (p.Glu260Serfs*15) pathogenic mutation identified on the 9 gene STAT panel.  The STAT Breast cancer panel offered by [facility] includes  sequencing and rearrangement analysis for the following 9 genes:  ATM, BRCA1, BRCA2, CDH1, CHEK2, PALB2, PTEN, STK11 and TP53.   The report date is April 17, 2017.  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  The Hereditary Gene Panel offered by [facility] includes sequencing and/or deletion duplication testing of the following 46 genes: APC, ATM, AXIN2, BARD1, BMPR1A, BRCA1, BRCA2, BRIP1, CDH1, CDKN2A (p14ARF), CDKN2A (p16INK4a), CHEK2, CTNNA1, DICER1, EPCAM (Deletion/duplication testing only), GREM1 (promoter region deletion/duplication testing only), KIT, MEN1, MLH1, MSH2, MSH3, MSH6, MUTYH, NBN, NF1, NHTL1, PALB2, PDGFRA, PMS2, POLD1, POLE, PTEN, RAD50, RAD51C, RAD51D, SDHB, SDHC, SDHD, SMAD4, SMARCA4. STK11, TP53, TSC1, TSC2, and VHL.  The following genes were evaluated for sequence changes only: SDHA and HOXB13 c.251G>A variant only.  The report date is April 29, 2017.     04/27/2017 Surgery    Surgey 04/27/17 BILATERAL SKIN SPARING MASTECTOMY and LEFT AXILLARY LYMPH NODE and BILATERAL BREAST RECONSTRUCTION WITH PLACEMENT OF TISSUE EXPANDER AND FLEX HD (ACELLULAR HYDRATED DERMIS) Bilateral BY Dr. Barry Dienes and Dr. Marla Roe     04/27/2017 Pathology Results     Diagnosis  04/27/17 1. Breast, simple mastectomy, Right - FIBROCYSTIC CHANGES WITH ADENOSIS. - USUAL DUCTAL HYPERPLASIA. - HEALING BIOPSY SITE. - THERE IS NO EVIDENCE OF MALIGNANCY. 2. Breast, simple mastectomy, Left - INVASIVE DUCTAL CARCINOMA, GRADE II/III, SPANNING 1.2 CM. - DUCTAL CARCINOMA IN SITU WITH CALCIFICATIONS, HIGH GRADE. - INVASIVE DUCTAL CARCINOMA IS FOCALLY PRESENT AT THE ANTERIOR MARGIN. - DUCTAL CARCINOMA IN SITU IS BROADLY 0.1 CM TO THE POSTERIOR MARGIN. - SEE ONCOLOGY TABLE BELOW. 3. Lymph nodes, regional resection, Left axillary - METASTATIC CARCINOMA IN 8 OF 21 LYMPH NODES (8/21). - SEE COMMENT     06/09/2017 - 11/16/2017 Chemotherapy    Adriamycin and Cytoxan every 2 weeks, for 4 cycles 06/09/17-07/21/17, followed by weekly Abraxane and carboplatin for 12 weeks starting 08/11/17. Held Carbo with cycle 2 due to poor toleration. Changed to single agent Abraxane for Cycle 3 on 09/08/17. Completed on 11/16/17 with reduced Abraxane due to body aches.     12/06/2017 Surgery    BREAST CAPSULECTOMY WITH IMPLANT EXCHANGE and PLACEMENT OF BREAST IMPLANTS and REMOVAL PORT-A-CATH  by Dr. Marla Roe on 12/06/17    12/18/2017 Survivorship    Survivorship clinic with NP Mendel Ryder     01/03/2018 - 02/19/2018 Radiation Therapy    Radiation therapy 01/03/18 - 02/19/18.    03/2018 -  Anti-estrogen oral therapy    Tamoxifen in 03/2018 --then Anastrozole after TAH/BSO      CURRENT THERAPY Tamoxifen '20mg'$  daily starting 03/2018. Will switch to Letrozole after BSO surgery.    INTERVAL HISTORY: Madison Savage is a 47 y.o. female who is here for follow-up. Her BSO is planned for 08/31/2018. She is currently attending PT.  Today, she is here alone. She is doing well. She sleeps well and is tolerating Tamoxifen well, but has noticed knee pain that goes away with exercise. She experiences hot flashes, but they are overall manageable and she doesn't want to be on medications for this. She was recently released from PT.     Pertinent positives and negatives of review of systems are listed and detailed within the above HPI.  REVIEW OF SYSTEMS:   Constitutional: Denies fevers, chills or abnormal weight loss (+) manageable hot flashes  Eyes: Denies blurriness of vision Ears, nose, mouth, throat, and face: Denies mucositis or sore throat Respiratory: Denies cough, dyspnea or wheezes Cardiovascular: Denies palpitation, chest discomfort or lower extremity swelling Gastrointestinal:  Denies nausea, heartburn or change in bowel habits Skin: Denies abnormal skin rashes Lymphatics: Denies new lymphadenopathy or easy  bruising Neurological:Denies numbness, tingling or new weaknesses MSK: (+) knee pain, resolves with exercise  Behavioral/Psych: Mood is stable, no new changes  All other systems were reviewed with the patient and are negative.  MEDICAL HISTORY:  Past Medical History:  Diagnosis Date  . Anemia   . Anxiety   . Cancer (Home) 03/2017   left breast cancer  . Chronic ITP (idiopathic thrombocytopenia) ([facility])   . Family history of breast cancer   . Family history of ovarian cancer   . Headache    last migraine...9/24  . Heart murmur    at birth.  none now  . MTHFR mutation [facility])     SURGICAL HISTORY: Past Surgical History:  Procedure Laterality Date  . AXILLARY LYMPH NODE DISSECTION Left 04/27/2017   Procedure: AXILLARY LYMPH NODE DISSECTION;  Surgeon: [name], MD;  Location: [place];  Service: General;  Laterality: Left;  . BILATERAL TOTAL MASTECTOMY WITH AXILLARY LYMPH NODE DISSECTION Bilateral 04/27/2017   Procedure: SKIN SPARING MASTECTOMY;  Surgeon: [name], MD;  Location: [place];  Service: General;  Laterality: Bilateral;  . BREAST CAPSULECTOMY WITH IMPLANT EXCHANGE Bilateral 12/06/2017   Procedure: BREAST CAPSULECTOMY WITH IMPLANT EXCHANGE;  Surgeon: Wallace Going, DO;  Location: [place];  Service: Plastics;  Laterality: Bilateral;  . BREAST RECONSTRUCTION WITH PLACEMENT OF TISSUE EXPANDER AND FLEX HD (ACELLULAR HYDRATED DERMIS) Bilateral 04/27/2017   Procedure: BILATERAL BREAST RECONSTRUCTION WITH PLACEMENT OF TISSUE EXPANDER AND FLEX HD (ACELLULAR HYDRATED DERMIS);  Surgeon: Wallace Going, DO;  Location: [place];  Service: Plastics;  Laterality: Bilateral;  . BREAST SURGERY    . MASTECTOMY    . PLACEMENT OF BREAST IMPLANTS Bilateral 12/06/2017   Procedure: PLACEMENT OF BREAST IMPLANTS;  Surgeon: Wallace Going, DO;  Location: [place];  Service: Plastics;  Laterality:  Bilateral;  . PORT-A-CATH REMOVAL Right 12/06/2017   Procedure: REMOVAL PORT-A-CATH;  Surgeon: Wallace Going, DO;  Location: [place];  Service: Plastics;  Laterality: Right;  . PORTACATH PLACEMENT Right 06/01/2017   Procedure: INSERTION PORT-A-CATH RIGHT SUBCLAVIAN;  Surgeon: [name], MD;  Location: [place];  Service: General;  Laterality: Right;  . WISDOM TOOTH EXTRACTION      I have reviewed the social history and family history with the patient and they are unchanged from previous note.  ALLERGIES:  is allergic to clindamycin; penicillins; and codeine.  MEDICATIONS:  Current Outpatient Medications  Medication Sig Dispense Refill  . ALPRAZolam (XANAX) 0.25 MG tablet Take 1 tablet (0.25 mg total) by mouth at bedtime as needed for anxiety. 20 tablet 0  . b complex vitamins capsule Take 1 capsule by mouth daily.    . Cyanocobalamin POWD Take by mouth.    . Multiple Vitamins-Calcium (ONE-A-DAY WOMENS PO) Take 1 tablet by mouth daily.    Marland Kitchen OVER THE COUNTER MEDICATION Take 1 capsule by mouth daily. Biotin/MSM    . tamoxifen (NOLVADEX) 20 MG tablet Take 1 tablet (20 mg total) by mouth daily. 90 tablet 0   No current facility-administered medications for this visit.    Facility-Administered Medications Ordered in Other Visits  Medication Dose  Route Frequency Provider Last Rate Last Dose  . sodium chloride flush (NS) 0.9 % injection 10 mL  10 mL Intravenous PRN [name], MD   10 mL at 07/21/17 1323    PHYSICAL EXAMINATION: ECOG PERFORMANCE STATUS: 0 - Asymptomatic  Vitals:   08/16/18 1131  BP: 114/82  Pulse: 74  Resp: 18  Temp: 98 F (36.7 C)  SpO2: 100%   Filed Weights   08/16/18 1131  Weight: 126 lb (57.2 kg)    GENERAL:alert, no distress and comfortable SKIN: skin color, texture, turgor are normal, no rashes or significant lesions EYES: normal, Conjunctiva are pink and non-injected, sclera clear OROPHARYNX:no exudate, no erythema and lips,  buccal mucosa, and tongue normal  NECK: supple, thyroid normal size, non-tender, without nodularity LYMPH:  no palpable lymphadenopathy in the cervical, axillary or inguinal LUNGS: clear to auscultation and percussion with normal breathing effort HEART: regular rate & rhythm and no murmurs and no lower extremity edema ABDOMEN:abdomen soft, non-tender and normal bowel sounds Musculoskeletal:no cyanosis of digits and no clubbing  NEURO: alert & oriented x 3 with fluent speech, no focal motor/sensory deficits  LABORATORY DATA:  I have reviewed the data as listed CBC Latest Ref Rng & Units 08/16/2018 04/16/2018 12/06/2017  WBC 4.0 - 10.5 K/uL 4.2 4.8 6.5  Hemoglobin 12.0 - 15.0 g/dL 13.7 13.9 13.0  Hematocrit 36.0 - 46.0 % 40.8 41.1 39.3  Platelets 150 - 400 K/uL 172 199 176     CMP Latest Ref Rng & Units 08/16/2018 04/16/2018 11/16/2017  Glucose 70 - 99 mg/dL 101(H) 95 110  BUN 6 - 20 mg/dL 22(H) 15 15  Creatinine 0.44 - 1.00 mg/dL 0.94 0.97 0.83  Sodium 135 - 145 mmol/L 141 140 139  Potassium 3.5 - 5.1 mmol/L 4.7 4.1 3.9  Chloride 98 - 111 mmol/L 106 105 108  CO2 22 - 32 mmol/L '26 25 24  '$ Calcium 8.9 - 10.3 mg/dL 9.4 9.3 9.0  Total Protein 6.5 - 8.1 g/dL 6.4(L) 6.6 6.0(L)  Total Bilirubin 0.3 - 1.2 mg/dL 0.3 0.4 0.2  Alkaline Phos 38 - 126 U/L 61 85 61  AST 15 - 41 U/L 14(L) 15 13  ALT 0 - 44 U/L '11 13 14      '$ RADIOGRAPHIC STUDIES: I have personally reviewed the radiological images as listed and agreed with the findings in the report. No results found.   ASSESSMENT & PLAN:  TAMMA BRIGANDI is a 47 y.o. female with history of  1. 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 BRCA 2 positive. Treated with surgery, adjuvant chemo and radiation. Currently on Tamoxifen, started 03/2018. Planning to switch to AI after BSO which is planned for 08/31/2018. -Labs reviewed, CBC is  WNLs. CMP is overall WNLs. -I will stop Tamoxifen today for her coming surgery, due to slightly increase risk of thrombosis around surgery. I will switch to AI after BSO surgery. I discussed with her today, she understands and agrees. Will likely start her on Letrozole. I educated her about the need to start vitamin D for her bones as Letrozole can lower her bone density. I also encouraged her to maintain a healthy diet and exercise. She is very active  -She was recently released from PT.  -f/u in 2 weeks after surgery to start Letrozole.   2. Chronic ITP  -Initially diagnosed during her last pregnancy. -Labs reviewed, CBC is WNLs PLT 172K.  3. Anxiety  -Currently on Xanax as needed.  4. Hot flush -Secondary to tamoxifen, moderate to severe, able to handle.  She sleeps at night. -Discussed her hot flash make it worse after BSO, we discussed medical management of hot flash if is unbearable.   Plan  -Will stop Tamoxifen and plan to start Letrozole 2 weeks after BSO surgery which is scheduled for 08/31/18 -f/u in one month  No problem-specific Assessment & Plan notes found for this encounter.   No orders of the defined types were placed in this encounter.  All questions were answered. The patient knows to call the clinic with any problems, questions or concerns. No barriers to learning was detected. I spent 20 minutes counseling the patient face to face. The total time spent in the appointment was 25 minutes and more than 50% was on counseling and review of test results  I, Noor Dweik am acting as scribe for Dr. [name].  I have reviewed the above documentation for accuracy and completeness, and I agree with the above.     [name], MD 08/16/2018

## 2018-08-16 ENCOUNTER — Inpatient Hospital Stay: Payer: BLUE CROSS/BLUE SHIELD | Attending: Hematology

## 2018-08-16 ENCOUNTER — Inpatient Hospital Stay (HOSPITAL_BASED_OUTPATIENT_CLINIC_OR_DEPARTMENT_OTHER): Payer: BLUE CROSS/BLUE SHIELD | Admitting: Hematology

## 2018-08-16 ENCOUNTER — Encounter: Payer: Self-pay | Admitting: Hematology

## 2018-08-16 VITALS — BP 114/82 | HR 74 | Temp 98.0°F | Resp 18 | Ht 68.0 in | Wt 126.0 lb

## 2018-08-16 DIAGNOSIS — N951 Menopausal and female climacteric states: Secondary | ICD-10-CM | POA: Diagnosis not present

## 2018-08-16 DIAGNOSIS — C50412 Malignant neoplasm of upper-outer quadrant of left female breast: Secondary | ICD-10-CM

## 2018-08-16 DIAGNOSIS — F419 Anxiety disorder, unspecified: Secondary | ICD-10-CM | POA: Diagnosis not present

## 2018-08-16 DIAGNOSIS — Z1501 Genetic susceptibility to malignant neoplasm of breast: Secondary | ICD-10-CM | POA: Insufficient documentation

## 2018-08-16 DIAGNOSIS — D693 Immune thrombocytopenic purpura: Secondary | ICD-10-CM

## 2018-08-16 DIAGNOSIS — C773 Secondary and unspecified malignant neoplasm of axilla and upper limb lymph nodes: Secondary | ICD-10-CM | POA: Diagnosis not present

## 2018-08-16 DIAGNOSIS — Z170 Estrogen receptor positive status [ER+]: Secondary | ICD-10-CM | POA: Diagnosis not present

## 2018-08-16 DIAGNOSIS — C50912 Malignant neoplasm of unspecified site of left female breast: Secondary | ICD-10-CM

## 2018-08-16 DIAGNOSIS — Z1502 Genetic susceptibility to malignant neoplasm of ovary: Secondary | ICD-10-CM | POA: Diagnosis not present

## 2018-08-16 DIAGNOSIS — Z1509 Genetic susceptibility to other malignant neoplasm: Secondary | ICD-10-CM

## 2018-08-16 LAB — COMPREHENSIVE METABOLIC PANEL
ALT: 11 U/L (ref 0–44)
AST: 14 U/L — ABNORMAL LOW (ref 15–41)
Albumin: 3.9 g/dL (ref 3.5–5.0)
Alkaline Phosphatase: 61 U/L (ref 38–126)
Anion gap: 9 (ref 5–15)
BUN: 22 mg/dL — ABNORMAL HIGH (ref 6–20)
CO2: 26 mmol/L (ref 22–32)
Calcium: 9.4 mg/dL (ref 8.9–10.3)
Chloride: 106 mmol/L (ref 98–111)
Creatinine, Ser: 0.94 mg/dL (ref 0.44–1.00)
GFR calc Af Amer: >60 mL/min (ref >60)
GFR calc non Af Amer: >60 mL/min (ref >60)
Glucose, Bld: 101 mg/dL — ABNORMAL HIGH (ref 70–99)
Potassium: 4.7 mmol/L (ref 3.5–5.1)
Sodium: 141 mmol/L (ref 135–145)
Total Bilirubin: 0.3 mg/dL (ref 0.3–1.2)
Total Protein: 6.4 g/dL — ABNORMAL LOW (ref 6.5–8.1)

## 2018-08-16 LAB — CBC WITH DIFFERENTIAL/PLATELET
Abs Immature Granulocytes: 0.01 10*3/uL (ref 0.00–0.07)
Basophils Absolute: 0 10*3/uL (ref 0.0–0.1)
Basophils Relative: 1 %
Eosinophils Absolute: 0.1 10*3/uL (ref 0.0–0.5)
Eosinophils Relative: 3 %
HCT: 40.8 % (ref 36.0–46.0)
Hemoglobin: 13.7 g/dL (ref 12.0–15.0)
Immature Granulocytes: 0 %
Lymphocytes Relative: 25 %
Lymphs Abs: 1.1 10*3/uL (ref 0.7–4.0)
MCH: 30.7 pg (ref 26.0–34.0)
MCHC: 33.6 g/dL (ref 30.0–36.0)
MCV: 91.5 fL (ref 80.0–100.0)
Monocytes Absolute: 0.4 10*3/uL (ref 0.1–1.0)
Monocytes Relative: 10 %
Neutro Abs: 2.6 10*3/uL (ref 1.7–7.7)
Neutrophils Relative %: 61 %
Platelets: 172 10*3/uL (ref 150–400)
RBC: 4.46 MIL/uL (ref 3.87–5.11)
RDW: 12.5 % (ref 11.5–15.5)
WBC: 4.2 10*3/uL (ref 4.0–10.5)
nRBC: 0 % (ref 0.0–0.2)

## 2018-08-17 LAB — CANCER ANTIGEN 27.29: CA 27.29: 7.6 U/mL (ref 0.0–38.6)

## 2018-08-21 ENCOUNTER — Telehealth: Payer: Self-pay | Admitting: *Deleted

## 2018-08-21 NOTE — Telephone Encounter (Signed)
-----   Message from [name], MD sent at 08/19/2018 11:27 AM EST ----- Let pt know her tumor marker was good last week, no concerns   [name]  08/19/2018

## 2018-08-21 NOTE — Telephone Encounter (Signed)
Notified of message below

## 2018-08-22 ENCOUNTER — Other Ambulatory Visit: Payer: Self-pay

## 2018-08-22 ENCOUNTER — Encounter (HOSPITAL_BASED_OUTPATIENT_CLINIC_OR_DEPARTMENT_OTHER): Payer: Self-pay | Admitting: *Deleted

## 2018-08-22 NOTE — Progress Notes (Addendum)
Spoke w/ pt via phone for pre-op interview.  Pt verbalized understanding to arrive 08-31-2018 at 0530 at [facility] main entrance go to admissions.  Pt verbalized understanding to start light diet day before surgery and from midnight to 0430 day of surgery have clear liquid diet and completed ensure pre-surgery drink.  Pt stated unable to pick up drink prior to surgery due to the holiday.  Pt verbalized understanding to have her clear liquids am of surgery until 0430 then nothing by mouth including water, candy, mints.

## 2018-08-31 ENCOUNTER — Encounter (HOSPITAL_COMMUNITY)
Admission: RE | Disposition: A | Discharge status: Home / Self Care | Payer: Self-pay | Source: Ambulatory Visit | Attending: Gynecologic Oncology

## 2018-08-31 ENCOUNTER — Encounter (HOSPITAL_COMMUNITY): Payer: Self-pay

## 2018-08-31 ENCOUNTER — Ambulatory Visit (HOSPITAL_COMMUNITY)
Admission: RE | Admit: 2018-08-31 | Discharge: 2018-08-31 | Disposition: A | Discharge status: Home / Self Care | Payer: BLUE CROSS/BLUE SHIELD | Source: Ambulatory Visit | Attending: Gynecologic Oncology | Admitting: Gynecologic Oncology

## 2018-08-31 ENCOUNTER — Ambulatory Visit (HOSPITAL_BASED_OUTPATIENT_CLINIC_OR_DEPARTMENT_OTHER): Payer: BLUE CROSS/BLUE SHIELD | Admitting: Certified Registered"

## 2018-08-31 DIAGNOSIS — D693 Immune thrombocytopenic purpura: Secondary | ICD-10-CM | POA: Diagnosis not present

## 2018-08-31 DIAGNOSIS — Z880 Allergy status to penicillin: Secondary | ICD-10-CM | POA: Diagnosis not present

## 2018-08-31 DIAGNOSIS — Z803 Family history of malignant neoplasm of breast: Secondary | ICD-10-CM | POA: Insufficient documentation

## 2018-08-31 DIAGNOSIS — Z1509 Genetic susceptibility to other malignant neoplasm: Secondary | ICD-10-CM

## 2018-08-31 DIAGNOSIS — Z9221 Personal history of antineoplastic chemotherapy: Secondary | ICD-10-CM | POA: Insufficient documentation

## 2018-08-31 DIAGNOSIS — Z1589 Genetic susceptibility to other disease: Secondary | ICD-10-CM | POA: Diagnosis present

## 2018-08-31 DIAGNOSIS — Z853 Personal history of malignant neoplasm of breast: Secondary | ICD-10-CM | POA: Diagnosis not present

## 2018-08-31 DIAGNOSIS — R51 Headache: Secondary | ICD-10-CM | POA: Diagnosis not present

## 2018-08-31 DIAGNOSIS — Z923 Personal history of irradiation: Secondary | ICD-10-CM | POA: Diagnosis not present

## 2018-08-31 DIAGNOSIS — D259 Leiomyoma of uterus, unspecified: Secondary | ICD-10-CM | POA: Diagnosis not present

## 2018-08-31 DIAGNOSIS — Z1502 Genetic susceptibility to malignant neoplasm of ovary: Secondary | ICD-10-CM | POA: Diagnosis not present

## 2018-08-31 DIAGNOSIS — Z885 Allergy status to narcotic agent status: Secondary | ICD-10-CM | POA: Diagnosis not present

## 2018-08-31 DIAGNOSIS — E2839 Other primary ovarian failure: Secondary | ICD-10-CM | POA: Insufficient documentation

## 2018-08-31 DIAGNOSIS — Z87891 Personal history of nicotine dependence: Secondary | ICD-10-CM | POA: Diagnosis not present

## 2018-08-31 DIAGNOSIS — Z170 Estrogen receptor positive status [ER+]: Secondary | ICD-10-CM | POA: Diagnosis not present

## 2018-08-31 DIAGNOSIS — Z8041 Family history of malignant neoplasm of ovary: Secondary | ICD-10-CM | POA: Diagnosis not present

## 2018-08-31 DIAGNOSIS — Z1501 Genetic susceptibility to malignant neoplasm of breast: Secondary | ICD-10-CM | POA: Insufficient documentation

## 2018-08-31 DIAGNOSIS — F419 Anxiety disorder, unspecified: Secondary | ICD-10-CM | POA: Insufficient documentation

## 2018-08-31 DIAGNOSIS — Z881 Allergy status to other antibiotic agents status: Secondary | ICD-10-CM | POA: Insufficient documentation

## 2018-08-31 DIAGNOSIS — Z79899 Other long term (current) drug therapy: Secondary | ICD-10-CM | POA: Diagnosis not present

## 2018-08-31 HISTORY — DX: Nausea with vomiting, unspecified: Z98.890

## 2018-08-31 HISTORY — DX: Malignant neoplasm of upper-outer quadrant of left female breast: C50.412

## 2018-08-31 HISTORY — DX: Presence of spectacles and contact lenses: Z97.3

## 2018-08-31 HISTORY — DX: Other specified postprocedural states: R11.2

## 2018-08-31 HISTORY — DX: Personal history of antineoplastic chemotherapy: Z92.21

## 2018-08-31 HISTORY — DX: Genetic susceptibility to other malignant neoplasm: Z15.09

## 2018-08-31 HISTORY — DX: Genetic susceptibility to malignant neoplasm of breast: Z15.01

## 2018-08-31 HISTORY — PX: ROBOTIC ASSISTED SALPINGO OOPHERECTOMY: SHX6082

## 2018-08-31 HISTORY — DX: Personal history of irradiation: Z92.3

## 2018-08-31 HISTORY — DX: Personal history of other specified conditions: Z87.898

## 2018-08-31 LAB — URINALYSIS, ROUTINE W REFLEX MICROSCOPIC
Bilirubin Urine: NEGATIVE
Glucose, UA: NEGATIVE mg/dL
Hgb urine dipstick: NEGATIVE
Ketones, ur: NEGATIVE mg/dL
Leukocytes, UA: NEGATIVE
Nitrite: NEGATIVE
Protein, ur: NEGATIVE mg/dL
Specific Gravity, Urine: 1.02 (ref 1.005–1.030)
pH: 5 (ref 5.0–8.0)

## 2018-08-31 LAB — ABO/RH: ABO/RH(D): A POS

## 2018-08-31 LAB — TYPE AND SCREEN
ABO/RH(D): A POS
Antibody Screen: NEGATIVE

## 2018-08-31 SURGERY — SALPINGO-OOPHORECTOMY, ROBOT-ASSISTED
Anesthesia: General | Laterality: Bilateral

## 2018-08-31 MED ORDER — SUGAMMADEX SODIUM 200 MG/2ML IV SOLN
INTRAVENOUS | Status: DC | PRN
  Administered 2018-08-31: 125 mg via INTRAVENOUS

## 2018-08-31 MED ORDER — MIDAZOLAM HCL 2 MG/2ML IJ SOLN
INTRAMUSCULAR | Status: AC
  Filled 2018-08-31: qty 2

## 2018-08-31 MED ORDER — IBUPROFEN 800 MG PO TABS: 800.0000 mg | ORAL_TABLET | Freq: Three times a day (TID) | ORAL | 0 refills | Status: DC | PRN

## 2018-08-31 MED ORDER — EPHEDRINE 5 MG/ML INJ
INTRAVENOUS | Status: AC
  Filled 2018-08-31: qty 10

## 2018-08-31 MED ORDER — HYDROMORPHONE HCL 1 MG/ML IJ SOLN: 0.2500 mg | INTRAMUSCULAR | Status: DC | PRN

## 2018-08-31 MED ORDER — MIDAZOLAM HCL 2 MG/2ML IJ SOLN
INTRAMUSCULAR | Status: DC | PRN
  Administered 2018-08-31: 2 mg via INTRAVENOUS

## 2018-08-31 MED ORDER — ONDANSETRON HCL 4 MG/2ML IJ SOLN
INTRAMUSCULAR | Status: AC
  Filled 2018-08-31: qty 2

## 2018-08-31 MED ORDER — FENTANYL CITRATE (PF) 100 MCG/2ML IJ SOLN
INTRAMUSCULAR | Status: AC
  Filled 2018-08-31: qty 4

## 2018-08-31 MED ORDER — FENTANYL CITRATE (PF) 250 MCG/5ML IJ SOLN
INTRAMUSCULAR | Status: AC
  Filled 2018-08-31: qty 5

## 2018-08-31 MED ORDER — ACETAMINOPHEN 500 MG PO TABS: 1000.0000 mg | ORAL_TABLET | Freq: Four times a day (QID) | ORAL | Status: DC

## 2018-08-31 MED ORDER — GABAPENTIN 300 MG PO CAPS
300.0000 mg | ORAL_CAPSULE | ORAL | Status: AC
  Administered 2018-08-31: 300 mg via ORAL
  Filled 2018-08-31: qty 1

## 2018-08-31 MED ORDER — OXYCODONE HCL 5 MG PO TABS
5.0000 mg | ORAL_TABLET | ORAL | Status: DC | PRN
  Administered 2018-08-31: 10 mg via ORAL

## 2018-08-31 MED ORDER — PROPOFOL 10 MG/ML IV BOLUS
INTRAVENOUS | Status: DC | PRN
  Administered 2018-08-31: 150 mg via INTRAVENOUS

## 2018-08-31 MED ORDER — ROCURONIUM BROMIDE 10 MG/ML (PF) SYRINGE
PREFILLED_SYRINGE | INTRAVENOUS | Status: DC | PRN
  Administered 2018-08-31: 50 mg via INTRAVENOUS

## 2018-08-31 MED ORDER — HYDROMORPHONE HCL 1 MG/ML IJ SOLN: 0.5000 mg | INTRAMUSCULAR | Status: DC | PRN

## 2018-08-31 MED ORDER — FENTANYL CITRATE (PF) 100 MCG/2ML IJ SOLN
25.0000 ug | INTRAMUSCULAR | Status: DC | PRN
  Administered 2018-08-31 (×2): 50 ug via INTRAVENOUS

## 2018-08-31 MED ORDER — KETOROLAC TROMETHAMINE 30 MG/ML IJ SOLN: 30.0000 mg | Freq: Four times a day (QID) | INTRAMUSCULAR | Status: DC

## 2018-08-31 MED ORDER — SUGAMMADEX SODIUM 200 MG/2ML IV SOLN
INTRAVENOUS | Status: AC
  Filled 2018-08-31: qty 2

## 2018-08-31 MED ORDER — BUPIVACAINE HCL (PF) 0.25 % IJ SOLN
INTRAMUSCULAR | Status: DC | PRN
  Administered 2018-08-31: 6 mL

## 2018-08-31 MED ORDER — LACTATED RINGERS IV SOLN
INTRAVENOUS | Status: DC
  Administered 2018-08-31 (×3): via INTRAVENOUS

## 2018-08-31 MED ORDER — SCOPOLAMINE 1 MG/3DAYS TD PT72
1.0000 | MEDICATED_PATCH | TRANSDERMAL | Status: DC
  Administered 2018-08-31: 1.5 mg via TRANSDERMAL
  Filled 2018-08-31: qty 1

## 2018-08-31 MED ORDER — METRONIDAZOLE IN NACL 5-0.79 MG/ML-% IV SOLN
500.0000 mg | INTRAVENOUS | Status: AC
  Administered 2018-08-31: 500 mg via INTRAVENOUS
  Filled 2018-08-31: qty 100

## 2018-08-31 MED ORDER — LACTATED RINGERS IV SOLN: INTRAVENOUS | Status: DC

## 2018-08-31 MED ORDER — ROCURONIUM BROMIDE 10 MG/ML (PF) SYRINGE
PREFILLED_SYRINGE | INTRAVENOUS | Status: AC
  Filled 2018-08-31: qty 10

## 2018-08-31 MED ORDER — ACETAMINOPHEN 500 MG PO TABS
1000.0000 mg | ORAL_TABLET | ORAL | Status: AC
  Administered 2018-08-31: 1000 mg via ORAL
  Filled 2018-08-31: qty 2

## 2018-08-31 MED ORDER — LIDOCAINE 2% (20 MG/ML) 5 ML SYRINGE
INTRAMUSCULAR | Status: AC
  Filled 2018-08-31: qty 5

## 2018-08-31 MED ORDER — EPHEDRINE SULFATE 50 MG/ML IJ SOLN
INTRAMUSCULAR | Status: DC | PRN
  Administered 2018-08-31 (×3): 10 mg via INTRAVENOUS

## 2018-08-31 MED ORDER — OXYCODONE HCL 5 MG PO TABS
ORAL_TABLET | ORAL | Status: AC
  Filled 2018-08-31: qty 2

## 2018-08-31 MED ORDER — PROPOFOL 10 MG/ML IV BOLUS
INTRAVENOUS | Status: AC
  Filled 2018-08-31: qty 20

## 2018-08-31 MED ORDER — OXYCODONE HCL 5 MG PO TABS: 5.0000 mg | ORAL_TABLET | ORAL | 0 refills | Status: DC | PRN

## 2018-08-31 MED ORDER — CELECOXIB 200 MG PO CAPS
400.0000 mg | ORAL_CAPSULE | ORAL | Status: AC
  Administered 2018-08-31: 400 mg via ORAL
  Filled 2018-08-31: qty 2

## 2018-08-31 MED ORDER — PROMETHAZINE HCL 25 MG/ML IJ SOLN: 6.2500 mg | INTRAMUSCULAR | Status: DC | PRN

## 2018-08-31 MED ORDER — FENTANYL CITRATE (PF) 100 MCG/2ML IJ SOLN
INTRAMUSCULAR | Status: DC | PRN
  Administered 2018-08-31 (×3): 50 ug via INTRAVENOUS
  Administered 2018-08-31: 100 ug via INTRAVENOUS

## 2018-08-31 MED ORDER — SENNA 8.6 MG PO TABS: 1.0000 | ORAL_TABLET | Freq: Every day | ORAL | 0 refills | Status: DC

## 2018-08-31 MED ORDER — ONDANSETRON HCL 4 MG/2ML IJ SOLN
INTRAMUSCULAR | Status: DC | PRN
  Administered 2018-08-31: 4 mg via INTRAVENOUS

## 2018-08-31 MED ORDER — SODIUM CHLORIDE 0.9 % IR SOLN
Status: DC | PRN
  Administered 2018-08-31: 1000 mL

## 2018-08-31 MED ORDER — MEPERIDINE HCL 50 MG/ML IJ SOLN: 6.2500 mg | INTRAMUSCULAR | Status: DC | PRN

## 2018-08-31 MED ORDER — DEXAMETHASONE SODIUM PHOSPHATE 10 MG/ML IJ SOLN
INTRAMUSCULAR | Status: AC
  Filled 2018-08-31: qty 1

## 2018-08-31 MED ORDER — GABAPENTIN 300 MG PO CAPS: 300.0000 mg | ORAL_CAPSULE | Freq: Two times a day (BID) | ORAL | Status: DC

## 2018-08-31 MED ORDER — CIPROFLOXACIN IN D5W 400 MG/200ML IV SOLN
400.0000 mg | INTRAVENOUS | Status: AC
  Administered 2018-08-31: 400 mg via INTRAVENOUS
  Filled 2018-08-31: qty 200

## 2018-08-31 MED ORDER — CELECOXIB 200 MG PO CAPS: 200.0000 mg | ORAL_CAPSULE | Freq: Two times a day (BID) | ORAL | Status: DC

## 2018-08-31 MED ORDER — ARTIFICIAL TEARS OPHTHALMIC OINT
TOPICAL_OINTMENT | OPHTHALMIC | Status: AC
  Filled 2018-08-31: qty 3.5

## 2018-08-31 MED ORDER — DEXAMETHASONE SODIUM PHOSPHATE 10 MG/ML IJ SOLN
INTRAMUSCULAR | Status: DC | PRN
  Administered 2018-08-31: 5 mg via INTRAVENOUS

## 2018-08-31 MED ORDER — LIDOCAINE 2% (20 MG/ML) 5 ML SYRINGE
INTRAMUSCULAR | Status: DC | PRN
  Administered 2018-08-31: 50 mg via INTRAVENOUS

## 2018-08-31 MED ORDER — DEXAMETHASONE SODIUM PHOSPHATE 4 MG/ML IJ SOLN: 4.0000 mg | INTRAMUSCULAR | Status: DC

## 2018-08-31 MED ORDER — PROPOFOL 10 MG/ML IV BOLUS
INTRAVENOUS | Status: AC
  Filled 2018-08-31: qty 40

## 2018-08-31 SURGICAL SUPPLY — 51 items
APPLICATOR SURGIFLO ENDO (HEMOSTASIS) IMPLANT
BAG LAPAROSCOPIC 12 15 PORT 16 (BASKET) IMPLANT
BAG RETRIEVAL 12/15 (BASKET)
COVER BACK TABLE 60X90IN (DRAPES) ×2 IMPLANT
COVER TIP SHEARS 8 DVNC (MISCELLANEOUS) ×1 IMPLANT
COVER TIP SHEARS 8MM DA VINCI (MISCELLANEOUS) ×1
COVER WAND RF STERILE (DRAPES) IMPLANT
DERMABOND ADVANCED (GAUZE/BANDAGES/DRESSINGS) ×1
DERMABOND ADVANCED .7 DNX12 (GAUZE/BANDAGES/DRESSINGS) ×1 IMPLANT
DRAPE ARM DVNC X/XI (DISPOSABLE) ×4 IMPLANT
DRAPE COLUMN DVNC XI (DISPOSABLE) ×1 IMPLANT
DRAPE DA VINCI XI ARM (DISPOSABLE) ×4
DRAPE DA VINCI XI COLUMN (DISPOSABLE) ×1
DRAPE SHEET LG 3/4 BI-LAMINATE (DRAPES) ×2 IMPLANT
DRAPE SURG IRRIG POUCH 19X23 (DRAPES) ×2 IMPLANT
ELECT REM PT RETURN 9FT ADLT (ELECTROSURGICAL) ×2
ELECTRODE REM PT RTRN 9FT ADLT (ELECTROSURGICAL) ×1 IMPLANT
GLOVE BIO SURGEON STRL SZ 6 (GLOVE) ×6 IMPLANT
GLOVE BIO SURGEON STRL SZ 6.5 (GLOVE) ×2 IMPLANT
GOWN STRL REUS W/ TWL LRG LVL3 (GOWN DISPOSABLE) ×5 IMPLANT
GOWN STRL REUS W/TWL LRG LVL3 (GOWN DISPOSABLE) ×5
HOLDER FOLEY CATH W/STRAP (MISCELLANEOUS) IMPLANT
IRRIG SUCT STRYKERFLOW 2 WTIP (MISCELLANEOUS) ×2
IRRIGATION SUCT STRKRFLW 2 WTP (MISCELLANEOUS) ×1 IMPLANT
KIT PROCEDURE DA VINCI SI (MISCELLANEOUS)
KIT PROCEDURE DVNC SI (MISCELLANEOUS) IMPLANT
MANIPULATOR UTERINE 4.5 ZUMI (MISCELLANEOUS) ×2 IMPLANT
NEEDLE SPNL 18GX3.5 QUINCKE PK (NEEDLE) IMPLANT
OBTURATOR OPTICAL STANDARD 8MM (TROCAR) ×1
OBTURATOR OPTICAL STND 8 DVNC (TROCAR) ×1
OBTURATOR OPTICALSTD 8 DVNC (TROCAR) ×1 IMPLANT
PACK ROBOT GYN CUSTOM WL (TRAY / TRAY PROCEDURE) IMPLANT
PACK ROBOT WH (CUSTOM PROCEDURE TRAY) ×2 IMPLANT
PAD POSITIONING PINK XL (MISCELLANEOUS) ×2 IMPLANT
PORT ACCESS TROCAR AIRSEAL 12 (TROCAR) ×1 IMPLANT
PORT ACCESS TROCAR AIRSEAL 5M (TROCAR) ×1
POUCH SPECIMEN RETRIEVAL 10MM (ENDOMECHANICALS) ×4 IMPLANT
SEAL CANN UNIV 5-8 DVNC XI (MISCELLANEOUS) ×4 IMPLANT
SEAL XI 5MM-8MM UNIVERSAL (MISCELLANEOUS) ×4
SET TRI-LUMEN FLTR TB AIRSEAL (TUBING) ×2 IMPLANT
SURGIFLO W/THROMBIN 8M KIT (HEMOSTASIS) IMPLANT
SUT MNCRL AB 4-0 PS2 18 (SUTURE) ×4 IMPLANT
SUT VIC AB 0 CT1 27 (SUTURE)
SUT VIC AB 0 CT1 27XBRD ANTBC (SUTURE) IMPLANT
SUT VICRYL 0 UR6 27IN ABS (SUTURE) ×2 IMPLANT
SYR 10ML LL (SYRINGE) IMPLANT
TOWEL OR NON WOVEN STRL DISP B (DISPOSABLE) ×2 IMPLANT
TRAP SPECIMEN MUCOUS 40CC (MISCELLANEOUS) ×2 IMPLANT
TRAY FOLEY MTR SLVR 16FR STAT (SET/KITS/TRAYS/PACK) ×2 IMPLANT
UNDERPAD 30X30 (UNDERPADS AND DIAPERS) ×2 IMPLANT
WATER STERILE IRR 1000ML POUR (IV SOLUTION) ×2 IMPLANT

## 2018-08-31 NOTE — Transfer of Care (Signed)
Immediate Anesthesia Transfer of Care Note  Patient: Madison Savage  Procedure(s) Performed: XI ROBOTIC ASSISTED BILATERAL  SALPINGO OOPHORECTOMY (Bilateral )  Patient Location: PACU  Anesthesia Type:General  Level of Consciousness: awake, alert , oriented and patient cooperative  Airway & Oxygen Therapy: Patient Spontanous Breathing and Patient connected to face mask oxygen  Post-op Assessment: Report given to RN and Post -op Vital signs reviewed and stable  Post vital signs: Reviewed and stable  Last Vitals:  Vitals Value Taken Time  BP 118/67 08/31/2018  9:06 AM  Temp    Pulse 74 08/31/2018  9:13 AM  Resp 28 08/31/2018  9:13 AM  SpO2 100 % 08/31/2018  9:13 AM  Vitals shown include unvalidated device data.  Last Pain:  Vitals:   08/31/18 0616  TempSrc:   PainSc: 0-No pain         Complications: No apparent anesthesia complications

## 2018-08-31 NOTE — H&P (Signed)
H&P Note: Gyn-Onc  Consult was initially requested by Dr. Burr Medico for the evaluation of Madison Savage 47 y.o. female  CC:  BRCA 2 deleterious mutation for risk reducing surgery.   Assessment/Plan:  Madison Savage  is a 47 y.o.  year old with BRCA 2 deleterious germline mutation, and a personal history of HR positive breast cancer, s/p chemotherapy and surgery and radiation.  We reviewed the recommendation for best practice is for BSO (+/- hysterectomy) after age 60 and end of childbearing.  Her scans from August, 2018 showed normal appearing ovaries, though she has a fibroid uterus. She is not interested in hysterectomy which is reasonable given that uterine cancer is not known to be associated with BRCA 2 mutations.   I discussed risks associated with early menopause (early death from CV related disease, osteoporosis, menopausal symptoms). I discussed that given her early ovarian failure, these risks may not be substantially elevated from her baseline risk and she may not notice significant side effects. She realizes that BSO is associated with permanent infertility.   I discussed operative risks including  bleeding, infection, damage to internal organs (such as bladder,ureters, bowels), blood clot, reoperation and rehospitalization.  HPI: Madison Savage is a 47 year old P3 who is seen in consultation at the request of Dr Burr Medico for a deleterious mutation in BRCA 2.  The patient has a strong paternal history of breast and ovarian malignancy. She has a paternal cousin who is a BRCA 1 deleterious mutation carrier. 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 She was tested for germline mutations and was noted to have a deleterious mutation in BRCA 2.  The patient is otherwise very healthy. She had 3 vaginal deliveries. She had secondary infertility which was felt to be  secondary to early loss of ovarian reserve (low AMH) and required fertility stimualtion. She had some menopausal symptoms and oligomenorrhea prior to her breast cancer treatment. She has never had abdominal surgeries.    Interval Hx: She went on to complete radiation in August, 2019. Since completing therapy she is somewhat overwhelmed by the idea of BSO and surgical menopause and was not quite ready for the procedure until later in the year.   Current Meds:  Outpatient Encounter Medications as of 06/01/2018  Medication Sig  . ALPRAZolam (XANAX) 0.25 MG tablet Take 1 tablet (0.25 mg total) by mouth at bedtime as needed for anxiety.  Marland Kitchen b complex vitamins capsule Take 1 capsule by mouth daily.  . Multiple Vitamins-Calcium (ONE-A-DAY WOMENS PO) Take 1 tablet by mouth daily.  Marland Kitchen OVER THE COUNTER MEDICATION Take 1 capsule by mouth daily. Biotin/MSM  . tamoxifen (NOLVADEX) 20 MG tablet TAKE 1 TABLET BY MOUTH EVERY DAY   Facility-Administered Encounter Medications as of 06/01/2018  Medication  . sodium chloride flush (NS) 0.9 % injection 10 mL    Allergy:  Allergies  Allergen Reactions  . Clindamycin Anaphylaxis, Shortness Of Breath and Rash  . Penicillins Nausea And Vomiting and Rash  . Codeine Itching    Social Hx:   Social History   Socioeconomic History  . Marital status: Married    Spouse name: Not on file  . Number of children: 3  . Years of education: Not on file  . Highest education level: Not on file  Occupational History    Employer: UNEMPLOYED  Social Needs  . Financial  resource strain: Not on file  . Food insecurity:    Worry: Not on file    Inability: Not on file  . Transportation needs:    Medical: Not on file    Non-medical: Not on file  Tobacco Use  . Smoking status: Former Smoker    Packs/day: 0.50    Years: 20.00    Pack years: 10.00    Types: Cigarettes    Last attempt to quit: 03/05/2017    Years since quitting: 1.4  . Smokeless tobacco: Never Used   Substance and Sexual Activity  . Alcohol use: No  . Drug use: No  . Sexual activity: Yes    Partners: Male    Birth control/protection: None  Lifestyle  . Physical activity:    Days per week: Not on file    Minutes per session: Not on file  . Stress: Not on file  Relationships  . Social connections:    Talks on phone: Not on file    Gets together: Not on file    Attends religious service: Not on file    Active member of club or organization: Not on file    Attends meetings of clubs or organizations: Not on file    Relationship status: Not on file  . Intimate partner violence:    Fear of current or ex partner: Not on file    Emotionally abused: Not on file    Physically abused: Not on file    Forced sexual activity: Not on file  Other Topics Concern  . Not on file  Social History Narrative   Stay at home mom   3 children ages 16, 60, 3    Past Surgical Hx:  Past Surgical History:  Procedure Laterality Date  . AXILLARY LYMPH NODE DISSECTION Left 04/27/2017   Procedure: AXILLARY LYMPH NODE DISSECTION;  Surgeon: [name], MD;  Location: [facility];  Service: General;  Laterality: Left;  . BILATERAL TOTAL MASTECTOMY WITH AXILLARY LYMPH NODE DISSECTION Bilateral 04/27/2017   Procedure: SKIN SPARING MASTECTOMY;  Surgeon: [name], MD;  Location: [facility];  Service: General;  Laterality: Bilateral;  . BREAST CAPSULECTOMY WITH IMPLANT EXCHANGE Bilateral 12/06/2017   Procedure: BREAST CAPSULECTOMY WITH IMPLANT EXCHANGE;  Surgeon: Wallace Going, DO;  Location: [facility];  Service: Plastics;  Laterality: Bilateral;  . BREAST RECONSTRUCTION WITH PLACEMENT OF TISSUE EXPANDER AND FLEX HD (ACELLULAR HYDRATED DERMIS) Bilateral 04/27/2017   Procedure: BILATERAL BREAST RECONSTRUCTION WITH PLACEMENT OF TISSUE EXPANDER AND FLEX HD (ACELLULAR HYDRATED DERMIS);  Surgeon: Wallace Going, DO;  Location: [facility];   Service: Plastics;  Laterality: Bilateral;  . PLACEMENT OF BREAST IMPLANTS Bilateral 12/06/2017   Procedure: PLACEMENT OF BREAST IMPLANTS;  Surgeon: Wallace Going, DO;  Location: [facility];  Service: Plastics;  Laterality: Bilateral;  . PORT-A-CATH REMOVAL Right 12/06/2017   Procedure: REMOVAL PORT-A-CATH;  Surgeon: Wallace Going, DO;  Location: [facility];  Service: Plastics;  Laterality: Right;  . PORTACATH PLACEMENT Right 06/01/2017   Procedure: INSERTION PORT-A-CATH RIGHT SUBCLAVIAN;  Surgeon: [name], MD;  Location: [facility];  Service: General;  Laterality: Right;  . WISDOM TOOTH EXTRACTION      Past Medical Hx:  Past Medical History:  Diagnosis Date  . Anemia   . Anxiety   . BRCA2 positive   . Chronic ITP (idiopathic thrombocytopenia) (HCC)   . Family history of breast cancer   . Family history of  ovarian cancer   . Headache    last migraine...9/24  . History of cancer chemotherapy    left breast 06-09-2017  to 11-16-2017  . History of cardiac murmur as a child    infant  . History of external beam radiation therapy    left breast   01-03-2018  to 02-19-2018  . 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 PONV (postoperative nausea and vomiting)   . 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 Wears contact lenses     Past Gynecological History:  No hx of abnormal paps, SVD x 3, known fibroid uterus. Patient's last menstrual period was 03/31/2017.  Family Hx:  Family History  Problem Relation Age of Onset  . Breast cancer Paternal Aunt 81  . Ovarian cancer Paternal Aunt 37  . Cancer Paternal Grandmother        breast cancer   .  Cancer Maternal Aunt        lymphoma   . Cancer Paternal Grandfather        throat cancer   . Cancer Paternal Aunt        lung cancer  . Cirrhosis Father   . AAA (abdominal aortic aneurysm) Maternal Grandfather   . Leukemia Paternal Aunt     Review of Systems:  Constitutional  Feels well,    ENT Normal appearing ears and nares bilaterally Skin/Breast  No rash, sores, jaundice, itching, dryness Cardiovascular  No chest pain, shortness of breath, or edema  Pulmonary  No cough or wheeze.  Gastro Intestinal  No nausea, vomitting, or diarrhoea. No bright red blood per rectum, no abdominal pain, change in bowel movement, or constipation.  Genito Urinary  No frequency, urgency, dysuria, no bleeding Musculo Skeletal  No myalgia, arthralgia, joint swelling or pain  Neurologic  No weakness, numbness, change in gait,  Psychology  No depression, anxiety, insomnia.   Vitals:  Blood pressure 100/72, pulse 68, temperature 98.5 F (36.9 C), temperature source Oral, resp. rate 16, height '5\' 8"'$  (1.727 m), weight 126 lb (57.2 kg), last menstrual period 03/31/2017, SpO2 97 %.  Physical Exam: WD in NAD Neck  Supple NROM, without any enlargements.  Lymph Node Survey No cervical supraclavicular or inguinal adenopathy Cardiovascular  Pulse normal rate, regularity and rhythm. S1 and S2 normal.  Lungs  Clear to auscultation bilateraly, without wheezes/crackles/rhonchi. Good air movement.  Skin  No rash/lesions/breakdown  Psychiatry  Alert and oriented to person, place, and time  Abdomen  Normoactive bowel sounds, abdomen soft, non-tender and thin without evidence of hernia.  Back No CVA tenderness Genito Urinary  Vulva/vagina: Normal external female genitalia.  No lesions. No discharge or bleeding.  Bladder/urethra:  No lesions or masses, well supported bladder  Vagina: normal  Cervix: Normal appearing, no lesions.  Uterus: approximately 10cm, mobile, no parametrial involvement or  nodularity.  Adnexa: no discretely palpable masses. Rectal  deferred Extremities  No bilateral cyanosis, clubbing or edema.   Thereasa Solo, MD  08/31/2018, 7:06 AM

## 2018-08-31 NOTE — Anesthesia Postprocedure Evaluation (Signed)
Anesthesia Post Note  Patient: Madison Savage  Procedure(s) Performed: XI ROBOTIC ASSISTED BILATERAL  SALPINGO OOPHORECTOMY (Bilateral )     Patient location during evaluation: PACU Anesthesia Type: General Level of consciousness: awake and alert Pain management: pain level controlled Vital Signs Assessment: post-procedure vital signs reviewed and stable Respiratory status: spontaneous breathing, nonlabored ventilation, respiratory function stable and patient connected to nasal cannula oxygen Cardiovascular status: blood pressure returned to baseline and stable Postop Assessment: no apparent nausea or vomiting Anesthetic complications: no    Last Vitals:  Vitals:   08/31/18 0945 08/31/18 0951  BP: 98/60 107/62  Pulse: 84 78  Resp: 16 19  Temp:  (!) 36.4 C  SpO2: 100% 99%    Last Pain:  Vitals:   08/31/18 0951  TempSrc:   PainSc: [place]

## 2018-08-31 NOTE — Discharge Instructions (Signed)
08/31/2018  Return to work: 4 weels  Activity: 1. Be up and out of the bed during the day.  Take a nap if needed.  You may walk up steps but be careful and use the hand rail.  Stair climbing will tire you more than you think, you may need to stop part way and rest.   2. No lifting or straining for 4 weeks.  3. No driving for 1 weeks.  Do Not drive if you are taking narcotic pain medicine.  4. Shower daily.  Use soap and water on your incision and pat dry; don't rub.   5. No sexual activity and nothing in the vagina for 2 weeks.  Medications:  - Take ibuprofen and tylenol first line for pain control. Take these regularly (every 6 hours) to decrease the build up of pain.  - If necessary, for severe pain not relieved by ibuprofen, take percocet.  - While taking percocet you should take sennakot every night to reduce the likelihood of constipation. If this causes diarrhea, stop its use.  Diet: 1. Low sodium Heart Healthy Diet is recommended.  2. It is safe to use a laxative if you have difficulty moving your bowels.   Wound Care: 1. Keep clean and dry.  Shower daily.  Reasons to call the Doctor:   Fever - Oral temperature greater than 100.4 degrees Fahrenheit  Foul-smelling vaginal discharge  Difficulty urinating  Nausea and vomiting  Increased pain at the site of the incision that is unrelieved with pain medicine.  Difficulty breathing with or without chest pain  New calf pain especially if only on one side  Sudden, continuing increased vaginal bleeding with or without clots.   Follow-up: 1. See Everitt Amber in 3-4 weeks.  Contacts: For questions or concerns you should contact:  Dr. Everitt Amber at [phone] After hours and on week-ends call [phone] and ask to speak to the physician on call for Gynecologic Oncology

## 2018-08-31 NOTE — Anesthesia Procedure Notes (Signed)
Procedure Name: Intubation Date/Time: 08/31/2018 7:38 AM Performed by: Wanita Chamberlain, CRNA Pre-anesthesia Checklist: Patient being monitored, Suction available, Emergency Drugs available, Patient identified and Timeout performed Patient Re-evaluated:Patient Re-evaluated prior to induction Oxygen Delivery Method: Circle system utilized Preoxygenation: Pre-oxygenation with 100% oxygen Induction Type: IV induction Ventilation: Mask ventilation without difficulty Laryngoscope Size: Mac and 3 Grade View: Grade I Tube type: Oral Tube size: 7.0 mm Number of attempts: 1 Airway Equipment and Method: Stylet Placement Confirmation: ETT inserted through vocal cords under direct vision,  positive ETCO2,  CO2 detector and breath sounds checked- equal and bilateral Secured at: 21 cm Tube secured with: Tape Dental Injury: Teeth and Oropharynx as per pre-operative assessment

## 2018-08-31 NOTE — Anesthesia Preprocedure Evaluation (Addendum)
Anesthesia Evaluation  Patient identified by MRN, date of birth, ID band Patient awake    Reviewed: Allergy & Precautions, NPO status , Patient's Chart, lab work & pertinent test results  History of Anesthesia Complications (+) PONV  Airway Mallampati: II  TM Distance: >3 FB Neck ROM: Full    Dental  (+) Teeth Intact, Dental Advisory Given   Pulmonary former smoker,    Pulmonary exam normal        Cardiovascular Exercise Tolerance: Good negative cardio ROS   Rhythm:Regular Rate:Normal     Neuro/Psych  Headaches, Anxiety    GI/Hepatic negative GI ROS, Neg liver ROS,   Endo/Other  negative endocrine ROS  Renal/GU negative Renal ROS     Musculoskeletal negative musculoskeletal ROS (+)   Abdominal Normal abdominal exam  (+)   Peds  Hematology negative hematology ROS (+) anemia ,   Anesthesia Other Findings   Reproductive/Obstetrics                          Anesthesia Physical Anesthesia Plan  ASA: II  Anesthesia Plan: General   Post-op Pain Management:    Induction: Intravenous  PONV Risk Score and Plan: 4 or greater and Ondansetron, Dexamethasone, Midazolam and Scopolamine patch - Pre-op  Airway Management Planned: Oral ETT  Additional Equipment: None  Intra-op Plan:   Post-operative Plan: Extubation in OR  Informed Consent: I have reviewed the patients History and Physical, chart, labs and discussed the procedure including the risks, benefits and alternatives for the proposed anesthesia with the patient or authorized representative who has indicated his/her understanding and acceptance.   Dental advisory given  Plan Discussed with: CRNA  Anesthesia Plan Comments:        Anesthesia Quick Evaluation

## 2018-08-31 NOTE — Op Note (Signed)
OPERATIVE NOTE 08/31/18  Surgeon: Donaciano Eva   Assistants: Sherlean Foot, RN  Anesthesia: General endotracheal anesthesia  ASA Class: 2   Pre-operative Diagnosis: BRCA 2 germline mutation  Post-operative Diagnosis: same  Operation: Robotic-assisted laparoscopic bilateral salpingoophorectomy   Surgeon: Donaciano Eva  Assistant Surgeon: Sherlean Foot RN  Anesthesia: GET  Urine Output: 200cc  Operative Findings:  : 8cm uterus, normal tubes and ovaries, normal omentum and peritoneum, normal appendix.   Estimated Blood Loss:  <77m      Total IV Fluids: 600 ml         Specimens: peritoneal washings for cytology, left and right tube and ovary         Complications:  None; patient tolerated the procedure well.         Disposition: PACU - hemodynamically stable.  Procedure Details  The patient was seen in the Holding Room. The risks, benefits, complications, treatment options, and expected outcomes were discussed with the patient.  The patient concurred with the proposed plan, giving informed consent.  The site of surgery properly noted/marked. The patient was identified as Madison ZAHNISERand the procedure verified as a Robotic-assisted bilateral salpingo oophorectomy. A Time Out was held and the above information confirmed.  After induction of anesthesia, the patient was draped and prepped in the usual sterile manner. Pt was placed in supine position after anesthesia and draped and prepped in the usual sterile manner. The abdominal drape was placed after the CholoraPrep had been allowed to dry for 3 minutes.  Her arms were tucked to her side with all appropriate precautions.  The chest was secured to the table.  The patient was placed in the semi-lithotomy position in [place]  The perineum was prepped with Betadine.  Foley catheter was placed.  A sterile speculum was placed in the vagina.  The cervix was grasped with a single-tooth tenaculum and dilated with  PKennon Roundsdilators.  The ZUMI uterine manipulator was placed without difficulty.  A second time-out was performed.  OG tube placement was confirmed and to suction.    Procedure:  A 12 mm skin incision was made 1 cm below the subcostal margin in the midclavicular line.  The 5 mm Optiview port and scope was used for direct entry.  Opening pressure was under 10 mm CO2.  The abdomen was insufflated and the findings were noted as above.   At this point and all points during the procedure, the patient's intra-abdominal pressure did not exceed 15 mmHg. Next, a 817mskin incision was made in the umbilicus and a right and left port was placed about 10 cm lateral to the robot port on the right and left side. All ports were placed under direct visualization.  The patient was placed in steep Trendelenburg.  Bowel was away into the upper abdomen.  The robot was docked in the normal manner. Washings were obtained.  The peritoneum on the right side was incised and the retroperitoneum was entered and the pararectal space was developed.  The ureter was noted to be on the medial leaf of the broad ligament.  The peritoneum above the ureter was incised and stretched and the infundibulopelvic ligament was skeletonized, cauterized and cut approximately 5cm proximal to the ovary.  The peritoneum was opened to the utero-ovarian ligament which was then skeletonized, sealed with bipolar and transected. The specimen was placed in an endocatch bag. A similar procedure was performed on the left to isolate the ovary, identify the ureter, skeletonize the  ovarian vessels and seal and transect the utero-ovarian ligament. This specimen was also placed in a bag.   Pedicles were inspected and excellent hemostasis was achieved.    At this point in the procedure was completed.  Robotic instruments were removed under direct visulaization.  The robot was undocked. The specimens were easily removed from the left upper quadrant port and sent for  patholgoy. The 10 mm ports were closed with Vicryl on a UR-5 needle and the fascia was closed with 0 Vicryl on a UR-5 needle.  The skin was closed with 4-0 Vicryl in a subcuticular manner.  Dermabond was applied.  Sponge, lap and needle counts correct x 2.  The patient was taken to the recovery room in stable condition.  The vagina was swabbed after removal of the zumi manipulator with  minimal bleeding noted.   All instrument and needle counts were correct x  3.   The patient was transferred to the recovery room in a stable condition.  Thereasa Solo, MD'

## 2018-09-03 ENCOUNTER — Telehealth: Payer: Self-pay

## 2018-09-03 ENCOUNTER — Encounter (HOSPITAL_BASED_OUTPATIENT_CLINIC_OR_DEPARTMENT_OTHER): Payer: Self-pay | Admitting: Gynecologic Oncology

## 2018-09-03 NOTE — Telephone Encounter (Signed)
Incoming call from pt regarding her surg path report- see previous note- pt voiced understanding. She reports she is doing well, eating and had bm today.  Reports she has soreness and is taking Ibuprofen during day and pain med at night-Oxycodone as prescribed if needed.  Reports some of the glue has rubbed off of incision areas and in her navel, there has been bright red spots then turned to pink then to brown today- she has used a gauze on this area. Wants to know if she should apply Aquafore.  Notified Joylene John NP.  Per her, I told pt not to use any thing on these incision areas because it can disrupt the glue, only use gauze as she has previously done. And to notify our office if bleeding increases.   Pt voiced understanding. Confirmed with her, her upcoming f/u appt. Encouraged to call for fever, chills, or any questions, or concerns before that appt. Pt voiced understanding. No other needs per pt at this time.

## 2018-09-03 NOTE — Telephone Encounter (Signed)
Outgoing call to patient regarding surgical path results per Madison John NP "no cancer or concerning findings, how is she post-op?"  No answer- left VM to call our office back.

## 2018-09-19 ENCOUNTER — Encounter: Payer: Self-pay | Admitting: Gynecologic Oncology

## 2018-09-19 ENCOUNTER — Inpatient Hospital Stay: Payer: BLUE CROSS/BLUE SHIELD | Attending: Hematology | Admitting: Gynecologic Oncology

## 2018-09-19 VITALS — BP 119/77 | HR 88 | Temp 98.6°F | Resp 20 | Ht 68.0 in | Wt 126.0 lb

## 2018-09-19 DIAGNOSIS — Z1501 Genetic susceptibility to malignant neoplasm of breast: Secondary | ICD-10-CM | POA: Diagnosis not present

## 2018-09-19 DIAGNOSIS — C773 Secondary and unspecified malignant neoplasm of axilla and upper limb lymph nodes: Secondary | ICD-10-CM | POA: Diagnosis not present

## 2018-09-19 DIAGNOSIS — D693 Immune thrombocytopenic purpura: Secondary | ICD-10-CM | POA: Diagnosis not present

## 2018-09-19 DIAGNOSIS — Z90710 Acquired absence of both cervix and uterus: Secondary | ICD-10-CM | POA: Diagnosis not present

## 2018-09-19 DIAGNOSIS — Z923 Personal history of irradiation: Secondary | ICD-10-CM | POA: Diagnosis not present

## 2018-09-19 DIAGNOSIS — C50412 Malignant neoplasm of upper-outer quadrant of left female breast: Secondary | ICD-10-CM | POA: Diagnosis not present

## 2018-09-19 DIAGNOSIS — Z9221 Personal history of antineoplastic chemotherapy: Secondary | ICD-10-CM | POA: Diagnosis not present

## 2018-09-19 DIAGNOSIS — F419 Anxiety disorder, unspecified: Secondary | ICD-10-CM | POA: Diagnosis not present

## 2018-09-19 DIAGNOSIS — Z90722 Acquired absence of ovaries, bilateral: Secondary | ICD-10-CM | POA: Diagnosis not present

## 2018-09-19 DIAGNOSIS — Z1502 Genetic susceptibility to malignant neoplasm of ovary: Secondary | ICD-10-CM

## 2018-09-19 DIAGNOSIS — Z87891 Personal history of nicotine dependence: Secondary | ICD-10-CM | POA: Diagnosis not present

## 2018-09-19 DIAGNOSIS — Z1509 Genetic susceptibility to other malignant neoplasm: Secondary | ICD-10-CM

## 2018-09-19 NOTE — Patient Instructions (Signed)
Please call Dr Denman George at [phone] with [facility]. Dr Denman George will ask Dr Burr Medico to let you know the details about starting Letrozole.  It is safe for you to ease back into your exercise routine.  Please follow-up with Dr Ronita Hipps every 5 years for cervical cancer screening , and notify him if you develop any vaginal bleeding.

## 2018-09-19 NOTE — Progress Notes (Signed)
Follow-up Note: Gyn-Onc  Consult was initially requested by Dr. Burr Medico for the evaluation of Madison Savage 47 y.o. female  CC:  Chief Complaint  Patient presents with  . BRCA gene mutation positive    Assessment/Plan:  Madison. MISSI Savage  is a 48 y.o.  year old with BRCA 2 deleterious germline mutation, and a personal history of HR positive breast cancer, s/p chemotherapy and surgery and radiation.  S/p risk reducing BSO on 08/31/18. She has done well postop. No complaints. She will follow-up with Dr Ronita Hipps for cervical cancer screening. She will follow-up with Dr Burr Medico for discussions regarding starting Letrozole.   HPI: Madison Madison Savage is a 48 year old P3 who is seen in consultation at the request of Dr Burr Medico for a deleterious mutation in BRCA 2.  The patient has a strong paternal history of breast and ovarian malignancy. She has a paternal cousin who is a BRCA 1 deleterious mutation carrier. 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 She was tested for germline mutations and was noted to have a deleterious mutation in BRCA 2.  The patient is otherwise very healthy. She had 3 vaginal deliveries. 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 She had some menopausal symptoms and oligomenorrhea prior to her breast cancer treatment. She has never had abdominal surgeries.    Interval Hx: She went on to complete radiation in August, 2019. Since completing therapy she is somewhat overwhelmed by the idea of BSO and surgical menopause and was not quite ready for the procedure.  [place] on 06/18/18 showed non visible ovaries and a uterus measuring 8.5cm x 3.4cm x 5.2cm. It contained a 2.3cm fibroid.  On 08/31/18 she underwent robotic assisted BSO. It was uncomplicated. Final  pathology of ovaries, tubes and washings were benign.   She has done well postop with no complaints.   Current Meds:  Outpatient Encounter Medications as of 09/19/2018  Medication Sig  . acetaminophen (TYLENOL) 500 MG tablet Take 500 mg by mouth every 6 (six) hours as needed.  Marland Kitchen b complex vitamins capsule Take 1 capsule by mouth daily.  . Cholecalciferol (VITAMIN D-3 PO) Take 1 capsule by mouth daily.  Marland Kitchen ibuprofen (ADVIL,MOTRIN) 800 MG tablet Take 1 tablet (800 mg total) by mouth every 8 (eight) hours as needed.  Marland Kitchen MAGNESIUM PO Take 1 tablet by mouth daily. Take Muscle cramps  . Misc Natural Products (CALCIUM PLUS ADVANCED PO) Take 1 tablet by mouth daily.  . Multiple Vitamins-Calcium (ONE-A-DAY WOMENS PO) Take 1 tablet by mouth daily.  Marland Kitchen OVER THE COUNTER MEDICATION Take 1 capsule by mouth daily. Biotin/MSM  . [DISCONTINUED] naproxen sodium (ALEVE) 220 MG tablet Take 220 mg by mouth as needed.  . [DISCONTINUED] oxyCODONE (OXY IR/ROXICODONE) 5 MG immediate release tablet Take 1 tablet (5 mg total) by mouth every 4 (four) hours as needed for severe pain.  . [DISCONTINUED] senna (SENOKOT) 8.6 MG TABS tablet Take 1 tablet (8.6 mg total) by mouth at bedtime.  . [DISCONTINUED] tamoxifen (NOLVADEX) 20 MG tablet Take 1 tablet (20 mg total) by mouth daily.   Facility-Administered Encounter Medications as of 09/19/2018  Medication  . sodium chloride flush (NS) 0.9 % injection 10 mL    Allergy:  Allergies  Allergen Reactions  . Clindamycin Anaphylaxis, Shortness Of Breath  and Rash  . Penicillins Nausea And Vomiting and Rash  . Codeine Itching    Social Hx:   Social History   Socioeconomic History  . Marital status: Married    Spouse name: Not on file  . Number of children: 3  . Years of education: Not on file  . Highest education level: Not on file  Occupational History    Employer: UNEMPLOYED  Social Needs  . Financial resource strain: Not on file  . Food insecurity:    Worry: Not  on file    Inability: Not on file  . Transportation needs:    Medical: Not on file    Non-medical: Not on file  Tobacco Use  . Smoking status: Former Smoker    Packs/day: 0.50    Years: 20.00    Pack years: 10.00    Types: Cigarettes    Last attempt to quit: 03/05/2017    Years since quitting: 1.5  . Smokeless tobacco: Never Used  Substance and Sexual Activity  . Alcohol use: No  . Drug use: No  . Sexual activity: Yes    Partners: Male    Birth control/protection: None  Lifestyle  . Physical activity:    Days per week: Not on file    Minutes per session: Not on file  . Stress: Not on file  Relationships  . Social connections:    Talks on phone: Not on file    Gets together: Not on file    Attends religious service: Not on file    Active member of club or organization: Not on file    Attends meetings of clubs or organizations: Not on file    Relationship status: Not on file  . Intimate partner violence:    Fear of current or ex partner: Not on file    Emotionally abused: Not on file    Physically abused: Not on file    Forced sexual activity: Not on file  Other Topics Concern  . Not on file  Social History Narrative   Stay at home mom   3 children ages 74, 73, 3    Past Surgical Hx:  Past Surgical History:  Procedure Laterality Date  . AXILLARY LYMPH NODE DISSECTION Left 04/27/2017   Procedure: AXILLARY LYMPH NODE DISSECTION;  Surgeon: [name], MD;  Location: [place];  Service: General;  Laterality: Left;  . BILATERAL TOTAL MASTECTOMY WITH AXILLARY LYMPH NODE DISSECTION Bilateral 04/27/2017   Procedure: SKIN SPARING MASTECTOMY;  Surgeon: [name], MD;  Location: [place];  Service: General;  Laterality: Bilateral;  . BREAST CAPSULECTOMY WITH IMPLANT EXCHANGE Bilateral 12/06/2017   Procedure: BREAST CAPSULECTOMY WITH IMPLANT EXCHANGE;  Surgeon: Wallace Going, DO;  Location: [employer] [facility];  Service: Plastics;   Laterality: Bilateral;  . BREAST RECONSTRUCTION WITH PLACEMENT OF TISSUE EXPANDER AND FLEX HD (ACELLULAR HYDRATED DERMIS) Bilateral 04/27/2017   Procedure: BILATERAL BREAST RECONSTRUCTION WITH PLACEMENT OF TISSUE EXPANDER AND FLEX HD (ACELLULAR HYDRATED DERMIS);  Surgeon: Wallace Going, DO;  Location: [place];  Service: Plastics;  Laterality: Bilateral;  . PLACEMENT OF BREAST IMPLANTS Bilateral 12/06/2017   Procedure: PLACEMENT OF BREAST IMPLANTS;  Surgeon: Wallace Going, DO;  Location: [place];  Service: Plastics;  Laterality: Bilateral;  . PORT-A-CATH REMOVAL Right 12/06/2017   Procedure: REMOVAL PORT-A-CATH;  Surgeon: Wallace Going, DO;  Location: [place];  Service: Plastics;  Laterality: Right;  . PORTACATH PLACEMENT Right 06/01/2017  Procedure: INSERTION PORT-A-CATH RIGHT SUBCLAVIAN;  Surgeon: [name], MD;  Location: [place];  Service: General;  Laterality: Right;  . ROBOTIC ASSISTED SALPINGO OOPHERECTOMY Bilateral 08/31/2018   Procedure: XI ROBOTIC ASSISTED BILATERAL  SALPINGO OOPHORECTOMY;  Surgeon: Everitt Amber, MD;  Location: [facility];  Service: Gynecology;  Laterality: Bilateral;  . WISDOM TOOTH EXTRACTION      Past Medical Hx:  Past Medical History:  Diagnosis Date  . Anemia   . Anxiety   . BRCA2 positive   . Chronic ITP (idiopathic thrombocytopenia) (HCC)   . Family history of breast cancer   . Family history of ovarian cancer   . Headache    last migraine...9/24  . History of cancer chemotherapy    left breast 06-09-2017  to 11-16-2017  . History of cardiac murmur as a child    infant  . History of external beam radiation therapy    left breast   01-03-2018  to 02-19-2018  . MTHFR mutation ([place])   . PONV (postoperative nausea and vomiting)   . 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 Wears contact lenses     Past Gynecological History:  No hx of abnormal paps, SVD x 3, known fibroid uterus. No LMP recorded. (Menstrual status: Chemotherapy).  Family Hx:  Family History  Problem Relation Age of Onset  . Breast cancer Paternal Aunt 84  . Ovarian cancer Paternal Aunt 68  . Cancer Paternal Grandmother        breast cancer   . Cancer Maternal Aunt        lymphoma   . Cancer Paternal Grandfather        throat cancer   . Cancer Paternal Aunt        lung cancer  . Cirrhosis Father   . AAA (abdominal aortic aneurysm) Maternal Grandfather   . Leukemia Paternal Aunt     Review of Systems:  Constitutional  Feels well,    ENT Normal appearing ears and nares bilaterally Skin/Breast  No rash, sores, jaundice, itching, dryness Cardiovascular  No chest pain, shortness of breath, or edema  Pulmonary  No cough or wheeze.  Gastro Intestinal  No nausea, vomitting, or diarrhoea. No bright red blood per rectum, no abdominal pain, change in bowel movement, or constipation.  Genito Urinary  No frequency, urgency, dysuria, no bleeding Musculo Skeletal  No myalgia, arthralgia, joint swelling or pain  Neurologic  No weakness, numbness, change in gait,  Psychology  No depression, anxiety, insomnia.   Vitals:  Blood pressure 119/77, pulse 88, temperature 98.6 F (37 C), temperature source Oral, resp. rate 20, height _0  (1.727 m), weight 126 lb (57.2 kg), SpO2 99 %.  Physical Exam: WD in NAD Neck  Supple NROM, without any enlargements.  Lymph Node Survey No cervical supraclavicular or inguinal adenopathy Cardiovascular  Pulse normal rate, regularity and rhythm. S1 and S2 normal.  Lungs  Clear to auscultation bilateraly, without  wheezes/crackles/rhonchi. Good air movement.  Skin  No rash/lesions/breakdown  Psychiatry  Alert and oriented to person, place, and time  Abdomen  Normoactive bowel sounds, abdomen soft, non-tender and thin without evidence of hernia. Well healed incisions.  Back No CVA  tenderness Genito Urinary  Vulva/vagina: Normal external female genitalia.  No lesions. No discharge or bleeding.  Bladder/urethra:  No lesions or masses, well supported bladder  Vagina: normal  Cervix: Normal appearing, no lesions.  Uterus: approximately 10cm, mobile, no parametrial involvement or nodularity.  Adnexa: no discretely palpable masses. Rectal  deferred Extremities  No bilateral cyanosis, clubbing or edema.   Thereasa Solo, MD  09/19/2018, 3:31 PM

## 2018-09-20 ENCOUNTER — Telehealth: Payer: Self-pay | Admitting: Hematology

## 2018-09-20 NOTE — Telephone Encounter (Signed)
Scheduled appt per 1/15 sch message - pt is aware of appt date and time   

## 2018-09-24 ENCOUNTER — Ambulatory Visit: Payer: BLUE CROSS/BLUE SHIELD | Admitting: Hematology

## 2018-09-25 NOTE — Progress Notes (Signed)
[name]   [phone] [phone]   Clinic Follow up Note   Patient Care Team: Dorena Cookey, MD as PCP - General [name], MD as Consulting Physician (General Surgery) [name], MD as Consulting Physician (Hematology) Kyung Rudd, MD as Consulting Physician (Radiation Oncology) Brien Few, MD as Consulting Physician (Obstetrics and Gynecology) Gardenia Phlegm, NP as Nurse Practitioner (Hematology and Oncology) 09/27/2018  CHIEF COMPLAINT: F/u on left breast cancer   SUMMARY OF ONCOLOGIC HISTORY: Oncology History   Cancer Staging Breast cancer metastasized to axillary lymph node, left [facility]) Staging form: Breast, AJCC 8th Edition - Clinical stage from 03/22/2017: Stage Unknown (cTX, cN1, cM0, GX, ER: Positive, PR: Positive, HER2: Negative) - Signed by [name], MD on 03/29/2017 - Pathologic stage from 04/27/2017: Stage IB (pT1c, pN2a, cM0, G2, ER: Positive, PR: Positive, HER2: Negative) - Signed by [name], MD on 05/25/2017 \     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 ([place])   03/21/2017 Mammogram    Diagnostic mammogram bilateral 03/21/17 IMPRESSION: INCOMPLETE - ADDITIONAL IMAGING EVALUATION NEEDED 1. The 0.6 cm oval mass in the right breast at 11 o'clock middle depth is indeterminate. An ultrasound is recommended.  2. The 0.5cm round focal asymmetry in the left breast central to the nipple middle depth is indeterminate. An ultrasound is recommended.  3. Ultrasound of the palpable abnormalities in the axilla and far left lateral breast is recommended.  4. Multiple clusters of pleomorphic calcifications in the left upper outer breast anterior depth spanning a 2.5 cm area are highly suggestive of malignancy. A stereotactic biopsy is recommended.      03/21/2017 Imaging    US Breast bilateral 03/21/17 IMPRESSION: HIGHLY SUGGESTIVE OF MALIGNANCY 1. Six distinct abnormal left axillary nodes, 2 of  which correspond to the palpable lumps felt by the patient. Findings concerning for metastatic adenopathy. Ultrasound guided biopsy of one of these nodes is recommended.  2. Small 5 mm palpable, oval mass in the left breast 3:00 areolar margin is at an intermediate suspicion. Ultrasound guided biopsy is recommended.  3. Isoechoic 8 mm mass in the right breast 10:00 5 cm from the nipple is also an intermediate suspicion for malignancy, and ultrasound guided biopsy is recommended.  4. Stereotactic guided biopsy for the highly suspicious calcifications in the left breast also recommended as detailed in the mammography report.      03/22/2017 Initial Biopsy    Diagnosis 03/22/17 Lymph node, needle/core biopsy, left axillary node -METASTATIC CARCINOMA, SEE COMMENT Microscopic Comment    03/22/2017 Receptors her2    Lymph node biopsy ER 95% positive, strong staining, PR 5% positive, weak staining, HER-2 negative, with HER2/CEP17 ratio 1.23 and copy #3.95.    03/27/2017 Pathology Results    Diagnosis 03/27/17 1. Breast, left, needle core biopsy -FIBROADENOMATOID NODULE WITH CALCIFICATIONS 2.Breast, left, needle core biopsy -DUCTAL CARCINOMA IN SITU, HIGH GRADE -SEE COMMENT  3. Breast, right, needle, core biopsy -FIBROADENOMA -NO MALIGNANCY IDENTIFIED     03/29/2017 Initial Diagnosis    Breast cancer metastasized to axillary lymph node, left ([place])    04/07/2017 Imaging    CT CAP 04/07/17 IMPRESSION: 1. Several prominent left axillary lymph nodes which may correspond to biopsy proven axillary lymph node metastasis. 2. No thoracic adenopathy identified or evidence of distant metastatic disease.    04/07/2017 Imaging    Bone whole body scan 04/07/17 IMPRESSION: Negative for evidence of osseous metastatic disease.    04/17/2017 Genetic Testing  BRCA2 c.[id number] (p.Glu260Serfs*15) pathogenic mutation identified on the 9 gene STAT panel.  The STAT Breast cancer panel offered by [facility] includes  sequencing and rearrangement analysis for the following 9 genes:  ATM, BRCA1, BRCA2, CDH1, CHEK2, PALB2, PTEN, STK11 and TP53.   The report date is April 17, 2017.  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  The Hereditary Gene Panel offered by [facility] includes sequencing and/or deletion duplication testing of the following 46 genes: APC, ATM, AXIN2, BARD1, BMPR1A, BRCA1, BRCA2, BRIP1, CDH1, CDKN2A (p14ARF), CDKN2A (p16INK4a), CHEK2, CTNNA1, DICER1, EPCAM (Deletion/duplication testing only), GREM1 (promoter region deletion/duplication testing only), KIT, MEN1, MLH1, MSH2, MSH3, MSH6, MUTYH, NBN, NF1, NHTL1, PALB2, PDGFRA, PMS2, POLD1, POLE, PTEN, RAD50, RAD51C, RAD51D, SDHB, SDHC, SDHD, SMAD4, SMARCA4. STK11, TP53, TSC1, TSC2, and VHL.  The following genes were evaluated for sequence changes only: SDHA and HOXB13 c.251G>A variant only.  The report date is April 29, 2017.     04/27/2017 Surgery    Surgey 04/27/17 BILATERAL SKIN SPARING MASTECTOMY and LEFT AXILLARY LYMPH NODE and BILATERAL BREAST RECONSTRUCTION WITH PLACEMENT OF TISSUE EXPANDER AND FLEX HD (ACELLULAR HYDRATED DERMIS) Bilateral BY Dr. Barry Dienes and Dr. Marla Roe     04/27/2017 Pathology Results     Diagnosis  04/27/17 1. Breast, simple mastectomy, Right - FIBROCYSTIC CHANGES WITH ADENOSIS. - USUAL DUCTAL HYPERPLASIA. - HEALING BIOPSY SITE. - THERE IS NO EVIDENCE OF MALIGNANCY. 2. Breast, simple mastectomy, Left - INVASIVE DUCTAL CARCINOMA, GRADE II/III, SPANNING 1.2 CM. - DUCTAL CARCINOMA IN SITU WITH CALCIFICATIONS, HIGH GRADE. - INVASIVE DUCTAL CARCINOMA IS FOCALLY PRESENT AT THE ANTERIOR MARGIN. - DUCTAL CARCINOMA IN SITU IS BROADLY 0.1 CM TO THE POSTERIOR MARGIN. - SEE ONCOLOGY TABLE BELOW. 3. Lymph nodes, regional resection, Left axillary - METASTATIC CARCINOMA IN 8 OF 21 LYMPH NODES (8/21). - SEE COMMENT     06/09/2017 - 11/16/2017 Chemotherapy    Adriamycin and Cytoxan every 2 weeks, for 4 cycles 06/09/17-07/21/17, followed by weekly Abraxane and carboplatin for 12 weeks starting 08/11/17. Held Carbo with cycle 2 due to poor toleration. Changed to single agent Abraxane for Cycle 3 on 09/08/17. Completed on 11/16/17 with reduced Abraxane due to body aches.     12/06/2017 Surgery    BREAST CAPSULECTOMY WITH IMPLANT EXCHANGE and PLACEMENT OF BREAST IMPLANTS and REMOVAL PORT-A-CATH  by Dr. Marla Roe on 12/06/17    12/18/2017 Survivorship    Survivorship clinic with NP Mendel Ryder     01/03/2018 - 02/19/2018 Radiation Therapy    Radiation therapy 01/03/18 - 02/19/18.    03/2018 -  Anti-estrogen oral therapy    Tamoxifen in 03/2018, stopped on 08/16/2018 before BSO, switched to Letrozole on 09/27/2018 after BSO     08/31/2018 Surgery    Surgery  08/31/2018 BSO With Dr. Denman George     CURRENT THERAPY: Adjuvant tamoxifen started in July 2019, switched to letrozole started 09/2018 after BSO    INTERVAL HISTORY: Madison Savage is a 48 y.o. female who is here for follow-up. Since our last visit, she had a BSO on 08/31/2018 with Dr. Denman George. Today, she is here alone. She is doing well and recovered well from surgery. She experienced mild pain at the site of abdominal incisions. Her daughter was recently diagnosed with the flu. The t didn't have a flu shot. She denies fever, cough, or nasal discharge. She has a headache last week with cold chills, but this has resolved. She has not restarted Tamoxifen. She  has not experienced hot flashes since BSO.   Pertinent positives and negatives of review of systems are listed and detailed within the above HPI.  REVIEW OF SYSTEMS:   Constitutional: Denies fevers, chills or abnormal weight loss Eyes: Denies blurriness of vision Ears, nose, mouth, throat, and face: Denies mucositis or sore throat Respiratory: Denies cough, dyspnea or wheezes Cardiovascular: Denies palpitation, chest  discomfort or lower extremity swelling Gastrointestinal:  Denies nausea, heartburn or change in bowel habits (+) mild pain at abdominal incision site  Skin: Denies abnormal skin rashes Lymphatics: Denies new lymphadenopathy or easy bruising Neurological:Denies numbness, tingling or new weaknesses Behavioral/Psych: Mood is stable, no new changes  All other systems were reviewed with the patient and are negative.  MEDICAL HISTORY:  Past Medical History:  Diagnosis Date  . Anemia   . Anxiety   . BRCA2 positive   . Chronic ITP (idiopathic thrombocytopenia) (H[facility])   . Family history of breast cancer   . Family history of ovarian cancer   . Headache    last migraine...9/24  . History of cancer chemotherapy    left breast 06-09-2017  to 11-16-2017  . History of cardiac murmur as a child    infant  . History of external beam radiation therapy    left breast   01-03-2018  to 02-19-2018  . MTHFR mutation ([place])   . PONV (postoperative nausea and vomiting)   . 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 [facility]) oncologist-- dr Burr Medico   dx 03-22-2017--- 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/  04-27-2018  s/p  bilateral mastectomy with left sln dissection's with reconstruction,  completed chemothearpy 11-16-2017,  completed radiation 02-19-2018,  started tamoxifen 07/ 2019  . Wears contact lenses     SURGICAL HISTORY: Past Surgical History:  Procedure Laterality Date  . AXILLARY LYMPH NODE DISSECTION Left 04/27/2017   Procedure: AXILLARY LYMPH NODE DISSECTION;  Surgeon: [name], MD;  Location: [place];  Service: General;  Laterality: Left;  . BILATERAL TOTAL MASTECTOMY WITH AXILLARY LYMPH NODE DISSECTION Bilateral 04/27/2017   Procedure: SKIN SPARING MASTECTOMY;  Surgeon: [name], MD;  Location: [place];  Service: General;  Laterality: Bilateral;  . BREAST CAPSULECTOMY WITH IMPLANT EXCHANGE Bilateral  12/06/2017   Procedure: BREAST CAPSULECTOMY WITH IMPLANT EXCHANGE;  Surgeon: Wallace Going, DO;  Location: [place];  Service: Plastics;  Laterality: Bilateral;  . BREAST RECONSTRUCTION WITH PLACEMENT OF TISSUE EXPANDER AND FLEX HD (ACELLULAR HYDRATED DERMIS) Bilateral 04/27/2017   Procedure: BILATERAL BREAST RECONSTRUCTION WITH PLACEMENT OF TISSUE EXPANDER AND FLEX HD (ACELLULAR HYDRATED DERMIS);  Surgeon: Wallace Going, DO;  Location: [place];  Service: Plastics;  Laterality: Bilateral;  . PLACEMENT OF BREAST IMPLANTS Bilateral 12/06/2017   Procedure: PLACEMENT OF BREAST IMPLANTS;  Surgeon: Wallace Going, DO;  Location: [place];  Service: Plastics;  Laterality: Bilateral;  . PORT-A-CATH REMOVAL Right 12/06/2017   Procedure: REMOVAL PORT-A-CATH;  Surgeon: Wallace Going, DO;  Location: [place];  Service: Plastics;  Laterality: Right;  . PORTACATH PLACEMENT Right 06/01/2017   Procedure: INSERTION PORT-A-CATH RIGHT SUBCLAVIAN;  Surgeon: [name], MD;  Location: [place];  Service: General;  Laterality: Right;  . ROBOTIC ASSISTED SALPINGO OOPHERECTOMY Bilateral 08/31/2018   Procedure: XI ROBOTIC ASSISTED BILATERAL  SALPINGO OOPHORECTOMY;  Surgeon: Everitt Amber, MD;  Location: [facility];  Service: Gynecology;  Laterality: Bilateral;  .  WISDOM TOOTH EXTRACTION      I have reviewed the social history and family history with the patient and they are unchanged from previous note.  ALLERGIES:  is allergic to clindamycin; penicillins; and codeine.  MEDICATIONS:  Current Outpatient Medications  Medication Sig Dispense Refill  . acetaminophen (TYLENOL) 500 MG tablet Take 500 mg by mouth every 6 (six) hours as needed.    Marland Kitchen b complex vitamins capsule Take 1 capsule by mouth daily.    . Cholecalciferol (VITAMIN D-3 PO) Take 1 capsule by mouth daily.    Marland Kitchen ibuprofen (ADVIL,MOTRIN) 800 MG tablet Take 1  tablet (800 mg total) by mouth every 8 (eight) hours as needed. 30 tablet 0  . MAGNESIUM PO Take 1 tablet by mouth daily. Take Muscle cramps    . Misc Natural Products (CALCIUM PLUS ADVANCED PO) Take 1 tablet by mouth daily.    . Multiple Vitamins-Calcium (ONE-A-DAY WOMENS PO) Take 1 tablet by mouth daily.    Marland Kitchen OVER THE COUNTER MEDICATION Take 1 capsule by mouth daily. Biotin/MSM    . letrozole (FEMARA) 2.5 MG tablet Take 1 tablet (2.5 mg total) by mouth daily. 30 tablet 3   No current facility-administered medications for this visit.    Facility-Administered Medications Ordered in Other Visits  Medication Dose Route Frequency Provider Last Rate Last Dose  . sodium chloride flush (NS) 0.9 % injection 10 mL  10 mL Intravenous PRN [name], MD   10 mL at 07/21/17 1323    PHYSICAL EXAMINATION: ECOG PERFORMANCE STATUS: 0 - Asymptomatic  Vitals:   09/27/18 1032  BP: 125/64  Pulse: 99  Resp: 18  Temp: 97.7 F (36.5 C)  SpO2: 98%   Filed Weights   09/27/18 1032  Weight: 126 lb 11.2 oz (57.5 kg)    GENERAL:alert, no distress and comfortable SKIN: skin color, texture, turgor are normal, no rashes or significant lesions EYES: normal, Conjunctiva are pink and non-injected, sclera clear OROPHARYNX:no exudate, no erythema and lips, buccal mucosa, and tongue normal  NECK: supple, thyroid normal size, non-tender, without nodularity LYMPH:  no palpable lymphadenopathy in the cervical, axillary or inguinal LUNGS: clear to auscultation and percussion with normal breathing effort HEART: regular rate & rhythm and no murmurs and no lower extremity edema ABDOMEN:abdomen soft, non-tender and normal bowel sounds Musculoskeletal:no cyanosis of digits and no clubbing  NEURO: alert & oriented x 3 with fluent speech, no focal motor/sensory deficits  LABORATORY DATA:  I have reviewed the data as listed CBC Latest Ref Rng & Units 08/16/2018 04/16/2018 12/06/2017  WBC 4.0 - 10.5 K/uL 4.2 4.8 6.5    Hemoglobin 12.0 - 15.0 g/dL 13.7 13.9 13.0  Hematocrit 36.0 - 46.0 % 40.8 41.1 39.3  Platelets 150 - 400 K/uL 172 199 176     CMP Latest Ref Rng & Units 08/16/2018 04/16/2018 11/16/2017  Glucose 70 - 99 mg/dL 101(H) 95 110  BUN 6 - 20 mg/dL 22(H) 15 15  Creatinine 0.44 - 1.00 mg/dL 0.94 0.97 0.83  Sodium 135 - 145 mmol/L 141 140 139  Potassium 3.5 - 5.1 mmol/L 4.7 4.1 3.9  Chloride 98 - 111 mmol/L 106 105 108  CO2 22 - 32 mmol/L '26 25 24  '$ Calcium 8.9 - 10.3 mg/dL 9.4 9.3 9.0  Total Protein 6.5 - 8.1 g/dL 6.4(L) 6.6 6.0(L)  Total Bilirubin 0.3 - 1.2 mg/dL 0.3 0.4 0.2  Alkaline Phos 38 - 126 U/L 61 85 61  AST 15 - 41 U/L 14(L) 15 13  ALT 0 - 44 U/L '11 13 14   '$ Surgery  08/31/2018 BSO With Dr. Denman George   RADIOGRAPHIC STUDIES: I have personally reviewed the radiological images as listed and agreed with the findings in the report. No results found.   ASSESSMENT & PLAN:  ANAYIA EUGENE is a 48 y.o. female with history of  1. 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 BRCA 2 +. Treated with surgery, adjuvant chemo and radiation. She started Tamoxifen in 03/2018, stopped before BSO. Plan to stat letrozole this visit  -We again reviewed the potential side effects of letrozole, especially arthralgia, osteoporosis, etc., she agrees to proceed.  I called in today.  If she does have significant arthralgia, will consider this switching to anastrozole exemestane, or back to tamoxifen. -Due to her high risk disease, I plan to continue AI for 7 to 10 years. -She had BSO on 08/31/2018.  -she is recovering well from BSO and denies hot flashes. She has mild pain at the site of abdominal incisions.  -f/u in 3 months  2. Chronic ITP  -Diagnosed during her last pregnancy. -Labs from 08/16/2018 showed PLT 172K.   3. Anxiety  -Currently on Xanax. Continue.  4. BRCA2 MUTATION (+)  -She  has had a bilateral mastectomy, and bilateral BSO -We will continue to monitor her other risk of cancer, such as pancreatic cancer, brain tumor, GU cancer clinically   Plan  -I prescribed letrozole, she will start this week  -f/u in 3 months with labs   No problem-specific Assessment & Plan notes found for this encounter.   No orders of the defined types were placed in this encounter.  All questions were answered. The patient knows to call the clinic with any problems, questions or concerns. No barriers to learning was detected. I spent 15 minutes counseling the patient face to face. The total time spent in the appointment was 20 minutes and more than 50% was on counseling and review of test results  I, Noor Dweik am acting as scribe for Dr. [name].  I have reviewed the above documentation for accuracy and completeness, and I agree with the above.     [name], MD 09/27/2018

## 2018-09-27 ENCOUNTER — Inpatient Hospital Stay (HOSPITAL_BASED_OUTPATIENT_CLINIC_OR_DEPARTMENT_OTHER): Payer: BLUE CROSS/BLUE SHIELD | Admitting: Hematology

## 2018-09-27 ENCOUNTER — Encounter: Payer: Self-pay | Admitting: Hematology

## 2018-09-27 VITALS — BP 125/64 | HR 99 | Temp 97.7°F | Resp 18 | Ht 68.0 in | Wt 126.7 lb

## 2018-09-27 DIAGNOSIS — C773 Secondary and unspecified malignant neoplasm of axilla and upper limb lymph nodes: Secondary | ICD-10-CM

## 2018-09-27 DIAGNOSIS — Z1502 Genetic susceptibility to malignant neoplasm of ovary: Secondary | ICD-10-CM | POA: Diagnosis not present

## 2018-09-27 DIAGNOSIS — C50412 Malignant neoplasm of upper-outer quadrant of left female breast: Secondary | ICD-10-CM | POA: Diagnosis not present

## 2018-09-27 DIAGNOSIS — Z923 Personal history of irradiation: Secondary | ICD-10-CM | POA: Diagnosis not present

## 2018-09-27 DIAGNOSIS — Z1501 Genetic susceptibility to malignant neoplasm of breast: Secondary | ICD-10-CM

## 2018-09-27 DIAGNOSIS — F419 Anxiety disorder, unspecified: Secondary | ICD-10-CM

## 2018-09-27 DIAGNOSIS — Z90722 Acquired absence of ovaries, bilateral: Secondary | ICD-10-CM | POA: Diagnosis not present

## 2018-09-27 DIAGNOSIS — Z87891 Personal history of nicotine dependence: Secondary | ICD-10-CM | POA: Diagnosis not present

## 2018-09-27 DIAGNOSIS — Z9221 Personal history of antineoplastic chemotherapy: Secondary | ICD-10-CM | POA: Diagnosis not present

## 2018-09-27 DIAGNOSIS — Z1509 Genetic susceptibility to other malignant neoplasm: Secondary | ICD-10-CM

## 2018-09-27 DIAGNOSIS — Z90710 Acquired absence of both cervix and uterus: Secondary | ICD-10-CM | POA: Diagnosis not present

## 2018-09-27 DIAGNOSIS — Z9013 Acquired absence of bilateral breasts and nipples: Secondary | ICD-10-CM

## 2018-09-27 DIAGNOSIS — D693 Immune thrombocytopenic purpura: Secondary | ICD-10-CM

## 2018-09-27 MED ORDER — LETROZOLE 2.5 MG PO TABS: 2.5000 mg | ORAL_TABLET | Freq: Every day | ORAL | 3 refills | Status: DC

## 2018-09-28 ENCOUNTER — Telehealth: Payer: Self-pay | Admitting: Hematology

## 2018-09-28 ENCOUNTER — Ambulatory Visit: Payer: BLUE CROSS/BLUE SHIELD | Admitting: Hematology

## 2018-09-28 NOTE — Telephone Encounter (Signed)
Scheduled appt per 01/23 los.  Called patient and patient is aware of appt date and time.

## 2018-10-03 ENCOUNTER — Encounter: Payer: Self-pay | Admitting: Hematology

## 2018-10-24 ENCOUNTER — Encounter: Payer: Self-pay | Admitting: Hematology

## 2018-10-25 ENCOUNTER — Other Ambulatory Visit: Payer: Self-pay | Admitting: Hematology

## 2018-10-25 MED ORDER — ALPRAZOLAM 0.25 MG PO TABS: 0.2500 mg | ORAL_TABLET | Freq: Every evening | ORAL | 0 refills | Status: DC | PRN

## 2018-10-25 NOTE — Telephone Encounter (Signed)
[name] requesting Alprazolam order; High anxiety.

## 2018-11-16 ENCOUNTER — Other Ambulatory Visit: Payer: Self-pay

## 2018-11-16 ENCOUNTER — Ambulatory Visit (INDEPENDENT_AMBULATORY_CARE_PROVIDER_SITE_OTHER): Payer: BLUE CROSS/BLUE SHIELD | Admitting: Plastic Surgery

## 2018-11-16 ENCOUNTER — Encounter: Payer: Self-pay | Admitting: Plastic Surgery

## 2018-11-16 VITALS — BP 127/71 | HR 81 | Temp 98.6°F | Ht 68.0 in | Wt 123.0 lb

## 2018-11-16 DIAGNOSIS — Z9013 Acquired absence of bilateral breasts and nipples: Secondary | ICD-10-CM | POA: Diagnosis not present

## 2018-11-16 DIAGNOSIS — N651 Disproportion of reconstructed breast: Secondary | ICD-10-CM | POA: Diagnosis not present

## 2018-11-16 DIAGNOSIS — Z9882 Breast implant status: Secondary | ICD-10-CM

## 2018-11-16 DIAGNOSIS — Z9889 Other specified postprocedural states: Secondary | ICD-10-CM

## 2018-11-16 NOTE — Progress Notes (Signed)
Preoperative Dx: right breast asymmetry  Postoperative Dx: Same  Procedure: Excision of right breast fat and skin 1 x 2 cm for symmetry  Surgeon: Dr. Lyndee Leo Dillingham  Anesthesia: Lidocaine 1% with 1:100,000 epinepherine  Indication for Procedure: asymmetry  Description of Procedure: Risks and complications were explained to the patient.  Consent was confirmed.  Time out was called and all information was confirmed to be correct.  The area was prepped with betadine and drapped.  Lidocaine 1% with epinepherine was injected in the subcutaneous area.  After waiting several minutes for the lidocaine to take affect a #15 blade was used to excise the area in an eliptical pattern that included 1 x 2 cm of skin and fat.  A 5-0 Monocryl was used to close the deep layers with simple interrupted stitches.  The skin edges were reapproximated with 5-0 Monocryl subcuticular running closure.  Steri strips were applied.  The patient is to follow up in 10 days.  She tolerated the procedure well and there were no complications.

## 2018-11-21 ENCOUNTER — Encounter: Payer: Self-pay | Admitting: Hematology

## 2018-11-27 ENCOUNTER — Ambulatory Visit (INDEPENDENT_AMBULATORY_CARE_PROVIDER_SITE_OTHER): Payer: BLUE CROSS/BLUE SHIELD | Admitting: Plastic Surgery

## 2018-11-27 ENCOUNTER — Other Ambulatory Visit: Payer: Self-pay

## 2018-11-27 ENCOUNTER — Encounter: Payer: Self-pay | Admitting: Plastic Surgery

## 2018-11-27 VITALS — BP 100/67 | HR 77 | Temp 98.5°F | Ht 68.0 in | Wt 121.0 lb

## 2018-11-27 DIAGNOSIS — N651 Disproportion of reconstructed breast: Secondary | ICD-10-CM

## 2018-11-27 NOTE — Progress Notes (Signed)
Patient is a 48 year old female here for follow-up after undergoing excision of a small area of excess skin on the right medial breast.  The area is healing well and the Steri-Strip is in place.  There is no redness.  There is no tenderness or sign of infection.  Patient states she is doing well.  The Steri-Strips and sutures were removed.  The area was cleaned and a fresh Steri-Strip was applied.  I encouraged her to remove it in a week and a half.  If it still there otherwise she can let it fall off.  Follow-up as needed

## 2018-12-19 ENCOUNTER — Encounter: Payer: Self-pay | Admitting: Hematology

## 2018-12-26 ENCOUNTER — Telehealth: Payer: Self-pay | Admitting: Hematology

## 2018-12-26 NOTE — Telephone Encounter (Signed)
Called patient regarding upcoming Webex appointment, patient is notified and Webex invite has been sent.

## 2018-12-26 NOTE — Progress Notes (Signed)
[place]   [phone] [phone]) [phone]   Clinic Follow up Note   Patient Care Team: Dorena Cookey, MD (Inactive) as PCP - General [name], MD as Consulting Physician (General Surgery) [name], MD as Consulting Physician (Hematology) Kyung Rudd, MD as Consulting Physician (Radiation Oncology) Brien Few, MD as Consulting Physician (Obstetrics and Gynecology) Delice Bison Charlestine Massed, NP as Nurse Practitioner (Hematology and Oncology)   I connected with [name] on 12/27/2018 at 10:30 AM EDT by telephone visit and verified that I am speaking with the correct person using two identifiers.  I discussed the limitations, risks, security and privacy concerns of performing an evaluation and management service by telephone and the availability of in person appointments. I also discussed with the patient that there may be a patient responsible charge related to this service. The patient expressed understanding and agreed to proceed.   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, BRCA2+  SUMMARY OF ONCOLOGIC HISTORY: Oncology History   Cancer Staging Breast cancer metastasized to axillary lymph node, left ([place]) Staging form: Breast, AJCC 8th Edition - Clinical stage from 03/22/2017: Stage Unknown (cTX, cN1, cM0, GX, ER: Positive, PR: Positive, HER2: Negative) - Signed by [name], MD on 03/29/2017 - Pathologic stage from 04/27/2017: Stage IB (pT1c, pN2a, cM0, G2, ER: Positive, PR: Positive, HER2: Negative) - Signed by [name], MD on 05/25/2017 \     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 ([place])   03/21/2017 Mammogram    Diagnostic mammogram bilateral 03/21/17 IMPRESSION: INCOMPLETE - ADDITIONAL IMAGING EVALUATION NEEDED 1. The 0.6 cm oval mass in the right breast at 11 o'clock middle depth is indeterminate. An ultrasound is recommended.  2. The  0.5cm round focal asymmetry in the left breast central to the nipple middle depth is indeterminate. An ultrasound is recommended.  3. Ultrasound of the palpable abnormalities in the axilla and far left lateral breast is recommended.  4. Multiple clusters of pleomorphic calcifications in the left upper outer breast anterior depth spanning a 2.5 cm area are highly suggestive of malignancy. A stereotactic biopsy is recommended.      03/21/2017 Imaging    US Breast bilateral 03/21/17 IMPRESSION: HIGHLY SUGGESTIVE OF MALIGNANCY 1. Six distinct abnormal left axillary nodes, 2 of which correspond to the palpable lumps felt by the patient. Findings concerning for metastatic adenopathy. Ultrasound guided biopsy of one of these nodes is recommended.  2. Small 5 mm palpable, oval mass in the left breast 3:00 areolar margin is at an intermediate suspicion. Ultrasound guided biopsy is recommended.  3. Isoechoic 8 mm mass in the right breast 10:00 5 cm from the nipple is also an intermediate suspicion for malignancy, and ultrasound guided biopsy is recommended.  4. Stereotactic guided biopsy for the highly suspicious calcifications in the left breast also recommended as detailed in the mammography report.      03/22/2017 Initial Biopsy    Diagnosis 03/22/17 Lymph node, needle/core biopsy, left axillary node -METASTATIC CARCINOMA, SEE COMMENT Microscopic Comment    03/22/2017 Receptors her2    Lymph node biopsy ER 95% positive, strong staining, PR 5% positive, weak staining, HER-2 negative, with HER2/CEP17 ratio 1.23 and copy #3.95.    03/27/2017 Pathology Results    Diagnosis 03/27/17 1. Breast, left, needle core biopsy -FIBROADENOMATOID NODULE WITH CALCIFICATIONS 2.Breast, left, needle core biopsy -DUCTAL CARCINOMA IN SITU, HIGH GRADE -SEE COMMENT  3. Breast, right, needle, core biopsy -FIBROADENOMA -NO MALIGNANCY IDENTIFIED     03/29/2017 Initial Diagnosis    Breast cancer metastasized to axillary  lymph node, left ([place])    04/07/2017 Imaging    CT CAP 04/07/17 IMPRESSION: 1. Several prominent left axillary lymph nodes which may correspond to biopsy proven axillary lymph node metastasis. 2. No thoracic adenopathy identified or evidence of distant metastatic disease.    04/07/2017 Imaging    Bone whole body scan 04/07/17 IMPRESSION: Negative for evidence of osseous metastatic disease.    04/17/2017 Genetic Testing    BRCA2 c.[id number] (p.Glu260Serfs*15) pathogenic mutation identified on the 9 gene STAT panel.  The STAT Breast cancer panel offered by [facility] includes sequencing and rearrangement analysis for the following 9 genes:  ATM, BRCA1, BRCA2, CDH1, CHEK2, PALB2, PTEN, STK11 and TP53.   The report date is April 17, 2017.  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  The Hereditary Gene Panel offered by [facility] includes sequencing and/or deletion duplication testing of the following 46 genes: APC, ATM, AXIN2, BARD1, BMPR1A, BRCA1, BRCA2, BRIP1, CDH1, CDKN2A (p14ARF), CDKN2A (p16INK4a), CHEK2, CTNNA1, DICER1, EPCAM (Deletion/duplication testing only), GREM1 (promoter region deletion/duplication testing only), KIT, MEN1, MLH1, MSH2, MSH3, MSH6, MUTYH, NBN, NF1, NHTL1, PALB2, PDGFRA, PMS2, POLD1, POLE, PTEN, RAD50, RAD51C, RAD51D, SDHB, SDHC, SDHD, SMAD4, SMARCA4. STK11, TP53, TSC1, TSC2, and VHL.  The following genes were evaluated for sequence changes only: SDHA and HOXB13 c.251G>A variant only.  The report date is April 29, 2017.     04/27/2017 Surgery    Surgey 04/27/17 BILATERAL SKIN SPARING MASTECTOMY and LEFT AXILLARY LYMPH NODE and BILATERAL BREAST RECONSTRUCTION WITH PLACEMENT OF TISSUE EXPANDER AND FLEX HD (ACELLULAR HYDRATED DERMIS) Bilateral BY Dr. Barry Dienes and Dr. Marla Roe     04/27/2017 Pathology Results     Diagnosis  04/27/17 1. Breast, simple mastectomy, Right -  FIBROCYSTIC CHANGES WITH ADENOSIS. - USUAL DUCTAL HYPERPLASIA. - HEALING BIOPSY SITE. - THERE IS NO EVIDENCE OF MALIGNANCY. 2. Breast, simple mastectomy, Left - INVASIVE DUCTAL CARCINOMA, GRADE II/III, SPANNING 1.2 CM. - DUCTAL CARCINOMA IN SITU WITH CALCIFICATIONS, HIGH GRADE. - INVASIVE DUCTAL CARCINOMA IS FOCALLY PRESENT AT THE ANTERIOR MARGIN. - DUCTAL CARCINOMA IN SITU IS BROADLY 0.1 CM TO THE POSTERIOR MARGIN. - SEE ONCOLOGY TABLE BELOW. 3. Lymph nodes, regional resection, Left axillary - METASTATIC CARCINOMA IN 8 OF 21 LYMPH NODES (8/21). - SEE COMMENT    06/09/2017 - 11/16/2017 Chemotherapy    Adriamycin and Cytoxan every 2 weeks, for 4 cycles 06/09/17-07/21/17, followed by weekly Abraxane and carboplatin for 12 weeks starting 08/11/17. Held Carbo with cycle 2 due to poor toleration. Changed to single agent Abraxane for Cycle 3 on 09/08/17. Completed on 11/16/17 with reduced Abraxane due to body aches.     12/06/2017 Surgery    BREAST CAPSULECTOMY WITH IMPLANT EXCHANGE and PLACEMENT OF BREAST IMPLANTS and REMOVAL PORT-A-CATH  by Dr. Marla Roe on 12/06/17    12/18/2017 Survivorship    Survivorship clinic with NP Mendel Ryder     01/03/2018 - 02/19/2018 Radiation Therapy    Radiation therapy 01/03/18 - 02/19/18.    03/2018 -  Anti-estrogen oral therapy    Adjuvant tamoxifen started in July 2019, switched to letrozole started 09/2018 after BSO but due to allergy reaction with hives, ithcing and swelling she d/c and restarted Tamoxifen.      08/31/2018 Surgery    08/31/2018 BSO With Dr. Denman George  Diagnosis 1. Ovary  and fallopian tube, right RIGHT OVARY AND RIGHT FALLOPIAN TUBE: - UNREMARKABLE. - NO ENDOMETRIOSIS OR MALIGNANCY. 2. Ovary and fallopian tube, left LEFT OVARY AND LEFT FALLOPIAN TUBE: - UNREMARKABLE. - NO ENDOMETRIOSIS OR MALIGNANCY.      CURRENT THERAPY:  Adjuvant tamoxifen started in July 2019, switched to letrozole started 09/2018 after BSO but due to allergy reaction with  hives, itching and swelling she d/c and restarted Tamoxifen in 09/2018.   INTERVAL HISTORY:  BAYLIE DRAKES is here for a follow up of left breast cancer, BRCA2+. She was able to identify herself by birth date. She notes she is doing well. She notes she has regained strength in her left arm. She still has tightness and pulling of her left pectoral and left upper back muscles. She is getting back to her normal weight lifting. She stays overall busy with her family during social distancing.  She notes with restarting Tamoxifen she has not been much less hot flashes or nausea. She notes having hair thinning.      REVIEW OF SYSTEMS:   Constitutional: Denies fevers, chills or abnormal weight loss (+) Occasional hot flashes  Eyes: Denies blurriness of vision Ears, nose, mouth, throat, and face: Denies mucositis or sore throat Respiratory: Denies cough, dyspnea or wheezes Cardiovascular: Denies palpitation, chest discomfort or lower extremity swelling Gastrointestinal:  Denies nausea, heartburn or change in bowel habits Skin: Denies abnormal skin rashes (+) Hair thinning  Lymphatics: Denies new lymphadenopathy or easy bruising Neurological:Denies numbness, tingling or new weaknesses Behavioral/Psych: Mood is stable, no new changes  All other systems were reviewed with the patient and are negative.  MEDICAL HISTORY:  Past Medical History:  Diagnosis Date  . Anemia   . Anxiety   . BRCA2 positive   . Chronic ITP (idiopathic thrombocytopenia) ([facility])   . Family history of breast cancer   . Family history of ovarian cancer   . Headache    last migraine...9/24  . History of cancer chemotherapy    left breast 06-09-2017  to 11-16-2017  . History of cardiac murmur as a child    infant  . History of external beam radiation therapy    left breast   01-03-2018  to 02-19-2018  . MTHFR mutation ([place])   . PONV (postoperative nausea and vomiting)   . Primary malignant neoplasm of upper outer  quadrant of left breast [facility]) oncologist-- dr Burr Medico   dx 03-22-2017--- 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/  04-27-2018  s/p  bilateral mastectomy with left sln dissection's with reconstruction,  completed chemothearpy 11-16-2017,  completed radiation 02-19-2018,  started tamoxifen 07/ 2019  . Wears contact lenses     SURGICAL HISTORY: Past Surgical History:  Procedure Laterality Date  . AXILLARY LYMPH NODE DISSECTION Left 04/27/2017   Procedure: AXILLARY LYMPH NODE DISSECTION;  Surgeon: [name], MD;  Location: [place];  Service: General;  Laterality: Left;  . BILATERAL TOTAL MASTECTOMY WITH AXILLARY LYMPH NODE DISSECTION Bilateral 04/27/2017   Procedure: SKIN SPARING MASTECTOMY;  Surgeon: [name], MD;  Location: [place];  Service: General;  Laterality: Bilateral;  . BREAST CAPSULECTOMY WITH IMPLANT EXCHANGE Bilateral 12/06/2017   Procedure: BREAST CAPSULECTOMY WITH IMPLANT EXCHANGE;  Surgeon: Wallace Going, DO;  Location: [place];  Service: Plastics;  Laterality: Bilateral;  . BREAST RECONSTRUCTION WITH PLACEMENT OF TISSUE EXPANDER AND FLEX HD (ACELLULAR HYDRATED DERMIS) Bilateral 04/27/2017   Procedure: BILATERAL BREAST RECONSTRUCTION WITH PLACEMENT  OF TISSUE EXPANDER AND FLEX HD (ACELLULAR HYDRATED DERMIS);  Surgeon: Wallace Going, DO;  Location: [place];  Service: Plastics;  Laterality: Bilateral;  . PLACEMENT OF BREAST IMPLANTS Bilateral 12/06/2017   Procedure: PLACEMENT OF BREAST IMPLANTS;  Surgeon: Wallace Going, DO;  Location: [place];  Service: Plastics;  Laterality: Bilateral;  . PORT-A-CATH REMOVAL Right 12/06/2017   Procedure: REMOVAL PORT-A-CATH;  Surgeon: Wallace Going, DO;  Location: [place];  Service: Plastics;  Laterality: Right;  . PORTACATH PLACEMENT Right 06/01/2017   Procedure: INSERTION PORT-A-CATH RIGHT  SUBCLAVIAN;  Surgeon: [name], MD;  Location: [place];  Service: General;  Laterality: Right;  . ROBOTIC ASSISTED SALPINGO OOPHERECTOMY Bilateral 08/31/2018   Procedure: XI ROBOTIC ASSISTED BILATERAL  SALPINGO OOPHORECTOMY;  Surgeon: Everitt Amber, MD;  Location[facility];  Service: Gynecology;  Laterality: Bilateral;  . WISDOM TOOTH EXTRACTION      I have reviewed the social history and family history with the patient and they are unchanged from previous note.  ALLERGIES:  is allergic to clindamycin; penicillins; and codeine.  MEDICATIONS:  Current Outpatient Medications  Medication Sig Dispense Refill  . ALPRAZolam (XANAX) 0.25 MG tablet Take 1 tablet (0.25 mg total) by mouth at bedtime as needed for anxiety. 30 tablet 0  . b complex vitamins capsule Take 1 capsule by mouth daily.    . Cholecalciferol (VITAMIN D-3 PO) Take 1 capsule by mouth daily.    Marland Kitchen ibuprofen (ADVIL,MOTRIN) 800 MG tablet Take 1 tablet (800 mg total) by mouth every 8 (eight) hours as needed. 30 tablet 0  . MAGNESIUM PO Take 1 tablet by mouth daily. Take Muscle cramps    . Misc Natural Products (CALCIUM PLUS ADVANCED PO) Take 1 tablet by mouth daily.    . Multiple Vitamins-Calcium (ONE-A-DAY WOMENS PO) Take 1 tablet by mouth daily.    Marland Kitchen OVER THE COUNTER MEDICATION Take 1 capsule by mouth daily. Biotin/MSM    . tamoxifen (NOLVADEX) 20 MG tablet Take 1 tablet (20 mg total) by mouth daily. 30 tablet 5   No current facility-administered medications for this visit.    Facility-Administered Medications Ordered in Other Visits  Medication Dose Route Frequency Provider Last Rate Last Dose  . sodium chloride flush (NS) 0.9 % injection 10 mL  10 mL Intravenous PRN [name], MD   10 mL at 07/21/17 1323    PHYSICAL EXAMINATION: ECOG PERFORMANCE STATUS: 0 - Asymptomatic  No vitals taken today, Exam not performed today   LABORATORY DATA:  I have reviewed the data as listed CBC Latest Ref Rng & Units  08/16/2018 04/16/2018 12/06/2017  WBC 4.0 - 10.5 K/uL 4.2 4.8 6.5  Hemoglobin 12.0 - 15.0 g/dL 13.7 13.9 13.0  Hematocrit 36.0 - 46.0 % 40.8 41.1 39.3  Platelets 150 - 400 K/uL 172 199 176     CMP Latest Ref Rng & Units 08/16/2018 04/16/2018 11/16/2017  Glucose 70 - 99 mg/dL 101(H) 95 110  BUN 6 - 20 mg/dL 22(H) 15 15  Creatinine 0.44 - 1.00 mg/dL 0.94 0.97 0.83  Sodium 135 - 145 mmol/L 141 140 139  Potassium 3.5 - 5.1 mmol/L 4.7 4.1 3.9  Chloride 98 - 111 mmol/L 106 105 108  CO2 22 - 32 mmol/L '26 25 24  '$ Calcium 8.9 - 10.3 mg/dL 9.4 9.3 9.0  Total Protein 6.5 - 8.1 g/dL 6.4(L) 6.6 6.0(L)  Total Bilirubin 0.3 - 1.2 mg/dL 0.3 0.4 0.2  Alkaline Phos  38 - 126 U/L 61 85 61  AST 15 - 41 U/L 14(L) 15 13  ALT 0 - 44 U/L '11 13 14      '$ RADIOGRAPHIC STUDIES: I have personally reviewed the radiological images as listed and agreed with the findings in the report. No results found.   ASSESSMENT & PLAN:  Madison Savage is a 48 y.o. female with   1. 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 BRCA 2 +. Treated with surgery, adjuvant chemo and radiation.  -She started anti-estrogen therapy with Tamoxifen in 03/2018. She stopped before BSO and started letrozole in 09/2018. Unfortunately she had allergic reaction to letrozole with hives, itching and swelling so she d/c and restarted Tamoxifen in 09/2018. She is tolerating very well overall with occasional hot flashes and hair thinning. I suggest she start biotin supplement.  -I discussed other AI options of Anastrozole and exemestane as AI is more beneficial than Tamoxifen in younger women after ovarian suppression. She will think about it and we will discuss further at next visit. She is reluctant due to concerns of allergy reaction  -She is clinically doing well. I encouraged her to continue healthy diet and adequate exercise.  Status post  bilateral mastectomy, no need routine mammogram. -Will get baseline DEXA next year. She will continue Vitamin D.  -Continue Tamoxifen  -f/u in 3 months  2. Chronic ITP -Diagnosed during her last pregnancy. -Last labs from 08/16/2018 showed PLT normal at 172K.   3. Anxiety -Currently on Xanax. Continue.  4. BRCA2 MUTATION (+)  -She has had a bilateral mastectomy, and bilateral BSO -We will continue to monitor her other risk of cancer, such as pancreatic cancer, brain tumor, GU cancer clinically   Plan  -She is clinically doing well  -Lab and f/u in 3 months  -Continue Tamoxifen, refilled today, will discuss AI again on next visit     No problem-specific Assessment & Plan notes found for this encounter.   No orders of the defined types were placed in this encounter.  I discussed the assessment and treatment plan with the patient. The patient was provided an opportunity to ask questions and all were answered. The patient agreed with the plan and demonstrated an understanding of the instructions.  The patient was advised to call back or seek an in-person evaluation if the symptoms worsen or if the condition fails to improve as anticipated.  I provided 15 minutes of non face-to-face telephone visit time during this encounter, and > 50% was spent counseling as documented under my assessment & plan.    [name], MD 12/27/2018   I, Joslyn Devon, am acting as scribe for [name], MD.   I have reviewed the above documentation for accuracy and completeness, and I agree with the above.

## 2018-12-27 ENCOUNTER — Other Ambulatory Visit: Payer: BLUE CROSS/BLUE SHIELD

## 2018-12-27 ENCOUNTER — Inpatient Hospital Stay: Payer: BLUE CROSS/BLUE SHIELD | Attending: Hematology | Admitting: Hematology

## 2018-12-27 ENCOUNTER — Telehealth: Payer: Self-pay | Admitting: Hematology

## 2018-12-27 ENCOUNTER — Encounter: Payer: Self-pay | Admitting: Hematology

## 2018-12-27 DIAGNOSIS — C50412 Malignant neoplasm of upper-outer quadrant of left female breast: Secondary | ICD-10-CM

## 2018-12-27 DIAGNOSIS — C773 Secondary and unspecified malignant neoplasm of axilla and upper limb lymph nodes: Secondary | ICD-10-CM | POA: Diagnosis not present

## 2018-12-27 DIAGNOSIS — Z170 Estrogen receptor positive status [ER+]: Secondary | ICD-10-CM

## 2018-12-27 DIAGNOSIS — D693 Immune thrombocytopenic purpura: Secondary | ICD-10-CM

## 2018-12-27 DIAGNOSIS — Z79810 Long term (current) use of selective estrogen receptor modulators (SERMs): Secondary | ICD-10-CM

## 2018-12-27 DIAGNOSIS — F419 Anxiety disorder, unspecified: Secondary | ICD-10-CM

## 2018-12-27 DIAGNOSIS — Z79899 Other long term (current) drug therapy: Secondary | ICD-10-CM

## 2018-12-27 MED ORDER — TAMOXIFEN CITRATE 20 MG PO TABS: 20.0000 mg | ORAL_TABLET | Freq: Every day | ORAL | 5 refills | Status: DC

## 2018-12-27 NOTE — Telephone Encounter (Signed)
Scheduled appt per 4/23 los. °

## 2019-02-19 ENCOUNTER — Encounter: Payer: Self-pay | Admitting: Genetic Counselor

## 2019-03-25 ENCOUNTER — Encounter: Payer: Self-pay | Admitting: Hematology

## 2019-03-25 ENCOUNTER — Other Ambulatory Visit: Payer: Self-pay

## 2019-03-25 DIAGNOSIS — Z20822 Contact with and (suspected) exposure to covid-19: Secondary | ICD-10-CM

## 2019-03-28 ENCOUNTER — Other Ambulatory Visit: Payer: BLUE CROSS/BLUE SHIELD

## 2019-03-28 ENCOUNTER — Ambulatory Visit: Payer: BLUE CROSS/BLUE SHIELD | Admitting: Hematology

## 2019-03-28 LAB — NOVEL CORONAVIRUS, NAA: SARS-CoV-2, NAA: NOT DETECTED

## 2019-04-24 NOTE — Progress Notes (Signed)
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, BRCA2+  SUMMARY OF ONCOLOGIC HISTORY: Oncology History Overview Note  Cancer Staging Breast cancer metastasized to axillary lymph node, left [facility]) Staging form: Breast, AJCC 8th Edition - Clinical stage from 03/22/2017: Stage Unknown (cTX, cN1, cM0, GX, ER: Positive, PR: Positive, HER2: Negative) - Signed by [name], MD on 03/29/2017 - Pathologic stage from 04/27/2017: Stage IB (pT1c, pN2a, cM0, G2, ER: Positive, PR: Positive, HER2: Negative) - Signed by [name], MD on 05/25/2017 _0   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 ([place])  03/21/2017 Mammogram   Diagnostic mammogram bilateral 03/21/17 IMPRESSION: INCOMPLETE - ADDITIONAL IMAGING EVALUATION NEEDED 1. The 0.6 cm oval mass in the right breast at 11 o'clock middle depth is indeterminate. An ultrasound is recommended.  2. The 0.5cm round focal asymmetry in the left breast central to the nipple middle depth is indeterminate. An ultrasound is recommended.  3. Ultrasound of the palpable abnormalities in the axilla and far left lateral breast is recommended.  4. Multiple clusters of pleomorphic calcifications in the left upper outer breast anterior depth spanning a 2.5 cm area are highly suggestive of malignancy. A stereotactic biopsy is recommended.     03/21/2017 Imaging   US Breast bilateral 03/21/17 IMPRESSION: HIGHLY SUGGESTIVE OF MALIGNANCY 1. Six  distinct abnormal left axillary nodes, 2 of which correspond to the palpable lumps felt by the patient. Findings concerning for metastatic adenopathy. Ultrasound guided biopsy of one of these nodes is recommended.  2. Small 5 mm palpable, oval mass in the left breast 3:00 areolar margin is at an intermediate suspicion. Ultrasound guided biopsy is recommended.  3. Isoechoic 8 mm mass in the right breast 10:00 5 cm from the nipple is also an intermediate suspicion for malignancy, and ultrasound guided biopsy is recommended.  4. Stereotactic guided biopsy for the highly suspicious calcifications in the left breast also recommended as detailed in the mammography report.     03/22/2017 Initial Biopsy   Diagnosis 03/22/17 Lymph node, needle/core biopsy, left axillary node -METASTATIC CARCINOMA, SEE COMMENT Microscopic Comment   03/22/2017 Receptors her2   Lymph node biopsy ER 95% positive, strong staining, PR 5% positive, weak staining, HER-2 negative, with HER2/CEP17 ratio 1.23 and copy #3.95.   03/27/2017 Pathology Results   Diagnosis 03/27/17 1. Breast, left, needle core biopsy -FIBROADENOMATOID NODULE WITH CALCIFICATIONS 2.Breast, left, needle core biopsy -DUCTAL CARCINOMA IN SITU, HIGH GRADE -SEE COMMENT  3. Breast, right, needle, core biopsy -FIBROADENOMA -NO MALIGNANCY IDENTIFIED    03/29/2017 Initial Diagnosis   Breast cancer metastasized to axillary lymph node, left ([place])   04/07/2017 Imaging   CT CAP 04/07/17 IMPRESSION: 1. Several prominent left axillary lymph nodes which may correspond to biopsy proven axillary lymph node metastasis. 2. No thoracic adenopathy identified or evidence of distant metastatic disease.   04/07/2017 Imaging   Bone whole body scan 04/07/17 IMPRESSION: Negative for evidence of osseous metastatic disease.   04/17/2017 Genetic Testing   BRCA2 c.[id number] (p.Glu260Serfs*15) pathogenic mutation identified on the 9  gene STAT panel.  The STAT Breast cancer  panel offered by [facility] includes sequencing and rearrangement analysis for the following 9 genes:  ATM, BRCA1, BRCA2, CDH1, CHEK2, PALB2, PTEN, STK11 and TP53.   The report date is April 17, 2017.  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  The Hereditary Gene Panel offered by [facility] includes sequencing and/or deletion duplication testing of the following 46 genes: APC, ATM, AXIN2, BARD1, BMPR1A, BRCA1, BRCA2, BRIP1, CDH1, CDKN2A (p14ARF), CDKN2A (p16INK4a), CHEK2, CTNNA1, DICER1, EPCAM (Deletion/duplication testing only), GREM1 (promoter region deletion/duplication testing only), KIT, MEN1, MLH1, MSH2, MSH3, MSH6, MUTYH, NBN, NF1, NHTL1, PALB2, PDGFRA, PMS2, POLD1, POLE, PTEN, RAD50, RAD51C, RAD51D, SDHB, SDHC, SDHD, SMAD4, SMARCA4. STK11, TP53, TSC1, TSC2, and VHL.  The following genes were evaluated for sequence changes only: SDHA and HOXB13 c.251G>A variant only.  The report date is April 29, 2017.    04/27/2017 Surgery   Surgey 04/27/17 BILATERAL SKIN SPARING MASTECTOMY and LEFT AXILLARY LYMPH NODE and BILATERAL BREAST RECONSTRUCTION WITH PLACEMENT OF TISSUE EXPANDER AND FLEX HD (ACELLULAR HYDRATED DERMIS) Bilateral BY Dr. Barry Dienes and Dr. Marla Roe    04/27/2017 Pathology Results    Diagnosis  04/27/17 1. Breast, simple mastectomy, Right - FIBROCYSTIC CHANGES WITH ADENOSIS. - USUAL DUCTAL HYPERPLASIA. - HEALING BIOPSY SITE. - THERE IS NO EVIDENCE OF MALIGNANCY. 2. Breast, simple mastectomy, Left - INVASIVE DUCTAL CARCINOMA, GRADE II/III, SPANNING 1.2 CM. - DUCTAL CARCINOMA IN SITU WITH CALCIFICATIONS, HIGH GRADE. - INVASIVE DUCTAL CARCINOMA IS FOCALLY PRESENT AT THE ANTERIOR MARGIN. - DUCTAL CARCINOMA IN SITU IS BROADLY 0.1 CM TO THE POSTERIOR MARGIN. - SEE ONCOLOGY TABLE BELOW. 3. Lymph nodes, regional resection, Left axillary - METASTATIC CARCINOMA IN 8 OF 21 LYMPH NODES  (8/21). - SEE COMMENT   06/09/2017 - 11/16/2017 Chemotherapy   Adriamycin and Cytoxan every 2 weeks, for 4 cycles 06/09/17-07/21/17, followed by weekly Abraxane and carboplatin for 12 weeks starting 08/11/17. Held Carbo with cycle 2 due to poor toleration. Changed to single agent Abraxane for Cycle 3 on 09/08/17. Completed on 11/16/17 with reduced Abraxane due to body aches.    12/06/2017 Surgery   BREAST CAPSULECTOMY WITH IMPLANT EXCHANGE and PLACEMENT OF BREAST IMPLANTS and REMOVAL PORT-A-CATH  by Dr. Marla Roe on 12/06/17   12/18/2017 Survivorship   Survivorship clinic with NP Mendel Ryder    01/03/2018 - 02/19/2018 Radiation Therapy   Radiation therapy 01/03/18 - 02/19/18.   03/2018 -  Anti-estrogen oral therapy   Adjuvant tamoxifen started in July 2019, switched to letrozole started 09/2018 after BSO but due to allergy reaction with hives, ithcing and swelling she d/c and restarted Tamoxifen.     08/31/2018 Surgery   08/31/2018 BSO With Dr. Denman George  Diagnosis 1. Ovary and fallopian tube, right RIGHT OVARY AND RIGHT FALLOPIAN TUBE: - UNREMARKABLE. - NO ENDOMETRIOSIS OR MALIGNANCY. 2. Ovary and fallopian tube, left LEFT OVARY AND LEFT FALLOPIAN TUBE: - UNREMARKABLE. - NO ENDOMETRIOSIS OR MALIGNANCY.      CURRENT THERAPY:  Adjuvant tamoxifen started in July 2019, switched to letrozole started 09/2018 after BSO but due to allergy reaction with hives, itching and swelling she d/c and restarted Tamoxifen in 09/2018.   INTERVAL HISTORY:  Madison Savage is here for a follow up of left breast cancer. She presents to the clinic alone.  She is doing well, has good appetite and energy level, she denies any significant pain, chest discomfort or abdominal symptoms.  She is tolerating tamoxifen  well, with mild hot flash, no other noticeable side effects.  She recently moved to a new house in suburb of [place], which is closer to her mother.  She is very excited about that.   REVIEW OF SYSTEMS:     Constitutional: Denies fevers, chills or abnormal weight loss Eyes: Denies blurriness of vision Ears, nose, mouth, throat, and face: Denies mucositis or sore throat Respiratory: Denies cough, dyspnea or wheezes Cardiovascular: Denies palpitation, chest discomfort or lower extremity swelling Gastrointestinal:  Denies nausea, heartburn or change in bowel habits Skin: Denies abnormal skin rashes Lymphatics: Denies new lymphadenopathy or easy bruising Neurological:Denies numbness, tingling or new weaknesses Behavioral/Psych: Mood is stable, no new changes  All other systems were reviewed with the patient and are negative.  MEDICAL HISTORY:  Past Medical History:  Diagnosis Date   Anemia    Anxiety    BRCA2 positive    Chronic ITP (idiopathic thrombocytopenia) ([facility])    Family history of breast cancer    Family history of ovarian cancer    Headache    last migraine...9/24   History of cancer chemotherapy    left breast 06-09-2017  to 11-16-2017   History of cardiac murmur as a child    infant   History of external beam radiation therapy    left breast   01-03-2018  to 02-19-2018   MTHFR mutation ([place])    PONV (postoperative nausea and vomiting)    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 [facility]) oncologist-- dr Burr Medico   dx 03-22-2017--- 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/  04-27-2018  s/p  bilateral mastectomy with left sln dissection's with reconstruction,  completed chemothearpy 11-16-2017,  completed radiation 02-19-2018,  started tamoxifen 07/ 2019   Wears contact lenses     SURGICAL HISTORY: Past Surgical History:  Procedure Laterality Date   AXILLARY LYMPH NODE DISSECTION Left 04/27/2017   Procedure: AXILLARY LYMPH NODE DISSECTION;  Surgeon: [name], MD;  Location: [place];  Service: General;  Laterality: Left;   BILATERAL TOTAL MASTECTOMY WITH AXILLARY LYMPH NODE DISSECTION  Bilateral 04/27/2017   Procedure: SKIN SPARING MASTECTOMY;  Surgeon: [name], MD;  Location: [place];  Service: General;  Laterality: Bilateral;   BREAST CAPSULECTOMY WITH IMPLANT EXCHANGE Bilateral 12/06/2017   Procedure: BREAST CAPSULECTOMY WITH IMPLANT EXCHANGE;  Surgeon: Wallace Going, DO;  Location: [place];  Service: Plastics;  Laterality: Bilateral;   BREAST RECONSTRUCTION WITH PLACEMENT OF TISSUE EXPANDER AND FLEX HD (ACELLULAR HYDRATED DERMIS) Bilateral 04/27/2017   Procedure: BILATERAL BREAST RECONSTRUCTION WITH PLACEMENT OF TISSUE EXPANDER AND FLEX HD (ACELLULAR HYDRATED DERMIS);  Surgeon: Wallace Going, DO;  Location: [facility];  Service: Plastics;  Laterality: Bilateral;   PLACEMENT OF BREAST IMPLANTS Bilateral 12/06/2017   Procedure: PLACEMENT OF BREAST IMPLANTS;  Surgeon: Wallace Going, DO;  Location: [place];  Service: Plastics;  Laterality: Bilateral;   PORT-A-CATH REMOVAL Right 12/06/2017   Procedure: REMOVAL PORT-A-CATH;  Surgeon: Wallace Going, DO;  Location: [place];  Service: Plastics;  Laterality: Right;   PORTACATH PLACEMENT Right 06/01/2017   Procedure: INSERTION PORT-A-CATH RIGHT SUBCLAVIAN;  Surgeon: [name], MD;  Location: [place];  Service: General;  Laterality: Right;   ROBOTIC ASSISTED SALPINGO OOPHERECTOMY Bilateral 08/31/2018   Procedure: XI ROBOTIC ASSISTED BILATERAL  SALPINGO OOPHORECTOMY;  Surgeon: Everitt Amber, MD;  Location: [place];  Service: Gynecology;  Laterality: Bilateral;   WISDOM TOOTH EXTRACTION      I have reviewed the social history and family history with the patient and they are unchanged from previous note.  ALLERGIES:  is allergic to clindamycin; penicillins; and codeine.  MEDICATIONS:  Current Outpatient Medications  Medication Sig Dispense Refill   b complex vitamins capsule Take 1 capsule by mouth  daily.     Cholecalciferol (VITAMIN D-3 PO) Take 1 capsule by mouth daily.     ibuprofen (ADVIL,MOTRIN) 800 MG tablet Take 1 tablet (800 mg total) by mouth every 8 (eight) hours as needed. 30 tablet 0   MAGNESIUM PO Take 1 tablet by mouth daily. Take Muscle cramps     Misc Natural Products (CALCIUM PLUS ADVANCED PO) Take 1 tablet by mouth daily.     Multiple Vitamins-Calcium (ONE-A-DAY WOMENS PO) Take 1 tablet by mouth daily.     OVER THE COUNTER MEDICATION Take 1 capsule by mouth daily. Biotin/MSM     tamoxifen (NOLVADEX) 20 MG tablet Take 1 tablet (20 mg total) by mouth daily. 30 tablet 5   ALPRAZolam (XANAX) 0.25 MG tablet Take 1 tablet (0.25 mg total) by mouth at bedtime as needed for anxiety. (Patient not taking: Reported on 04/25/2019) 30 tablet 0   No current facility-administered medications for this visit.    Facility-Administered Medications Ordered in Other Visits  Medication Dose Route Frequency Provider Last Rate Last Dose   sodium chloride flush (NS) 0.9 % injection 10 mL  10 mL Intravenous PRN [name], MD   10 mL at 07/21/17 1323    PHYSICAL EXAMINATION: ECOG PERFORMANCE STATUS: 0 - Asymptomatic  Vitals:   04/25/19 0839 04/25/19 0956  BP: (!) 139/96 110/71  Pulse: 80   Resp: 17   Temp: 98.3 F (36.8 C)   SpO2: 100%    Filed Weights   04/25/19 0839  Weight: 118 lb 14.4 oz (53.9 kg)   GENERAL:alert, no distress and comfortable SKIN: skin color, texture, turgor are normal, no rashes or significant lesions EYES: normal, Conjunctiva are pink and non-injected, sclera clear  NECK: supple, thyroid normal size, non-tender, without nodularity LYMPH:  no palpable lymphadenopathy in the cervical, axillary  LUNGS: clear to auscultation and percussion with normal breathing effort HEART: regular rate & rhythm and no murmurs and no lower extremity edema ABDOMEN:abdomen soft, non-tender and normal bowel sounds Musculoskeletal:no cyanosis of digits and no clubbing   NEURO: alert & oriented x 3 with fluent speech, no focal motor/sensory deficits BREAST: S/p b/l mastecotmy and reconstruction: Surgical incisoin healed well. No palpable mass, nodules or adenopathy bilaterally. Breast exam benign.   LABORATORY DATA:  I have reviewed the data as listed CBC Latest Ref Rng & Units 04/25/2019 08/16/2018 04/16/2018  WBC 4.0 - 10.5 K/uL 4.2 4.2 4.8  Hemoglobin 12.0 - 15.0 g/dL 14.0 13.7 13.9  Hematocrit 36.0 - 46.0 % 40.9 40.8 41.1  Platelets 150 - 400 K/uL 163 172 199     CMP Latest Ref Rng & Units 04/25/2019 08/16/2018 04/16/2018  Glucose 70 - 99 mg/dL 87 101(H) 95  BUN 6 - 20 mg/dL 13 22(H) 15  Creatinine 0.44 - 1.00 mg/dL 0.85 0.94 0.97  Sodium 135 - 145 mmol/L 141 141 140  Potassium 3.5 - 5.1 mmol/L 4.5 4.7 4.1  Chloride 98 - 111 mmol/L 107 106 105  CO2 22 - 32 mmol/L _0 Calcium 8.9 - 10.3 mg/dL 8.6(L) 9.4 9.3  Total Protein 6.5 - 8.1 g/dL 6.2(L) 6.4(L) 6.6  Total Bilirubin 0.3 - 1.2 mg/dL 0.2(L) 0.3 0.4  Alkaline Phos 38 - 126 U/L 58 61 85  AST 15 - 41 U/L 14(L) 14(L) 15  ALT 0 - 44 U/L _0 RADIOGRAPHIC STUDIES: I have personally reviewed the radiological images as listed and agreed with the findings in the report. No results found.   ASSESSMENT & PLAN:  Madison Savage is a 48 y.o. female with   1. 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 BRCA 2 +. Treated with b/l mastectomy, adjuvant chemo and radiation.  -She started anti-estrogen therapy with Tamoxifen in 03/2018. She stopped before BSO and started letrozole in 09/2018. Unfortunately she had allergic reaction to letrozole with hives, itching and swelling so she d/c and restarted Tamoxifen in 09/2018. She is tolerating very well overall with occasional hot flashes and hair thinning. I suggest she start biotin supplement.  -She is clinically doing well. Lab reviewed,  her CBC and CMP are within normal limits except low TP. Her physical exam was unremarkable. There is no clinical concern for recurrence. -Will get baseline DEXA in 2021 next year. She will continue Vitamin D.  -She has had a bilateral mastectomy, no need mammogram -f/u in 6 months   2. Chronic ITP -Diagnosed during her last pregnancy. -CBC today showed PLT normal at 163K   3. Anxiety -Currently on Xanax. Continue.  4. BRCA2 MUTATION (+)  -She has had a bilateral mastectomy, and bilateral BSO -We will continue to monitor her other risk of cancer, such as pancreatic cancer, brain tumor, GU cancer clinically   Plan -She is clinically doing well -Continue tamoxifen -Plan to follow-up in 6 months with lab    No problem-specific Assessment & Plan notes found for this encounter.   No orders of the defined types were placed in this encounter.  All questions were answered. The patient knows to call the clinic with any problems, questions or concerns. No barriers to learning was detected. I spent 15 minutes counseling the patient face to face. The total time spent in the appointment was 20 minutes and more than 50% was on counseling and review of test results     [name], MD 04/26/2019   I, Joslyn Devon, am acting as scribe for [name], MD.   I have reviewed the above documentation for accuracy and completeness, and I agree with the above.

## 2019-04-25 ENCOUNTER — Telehealth: Payer: Self-pay | Admitting: Hematology

## 2019-04-25 ENCOUNTER — Inpatient Hospital Stay: Payer: BC Managed Care – PPO | Attending: Hematology

## 2019-04-25 ENCOUNTER — Other Ambulatory Visit: Payer: Self-pay

## 2019-04-25 ENCOUNTER — Inpatient Hospital Stay (HOSPITAL_BASED_OUTPATIENT_CLINIC_OR_DEPARTMENT_OTHER): Payer: BC Managed Care – PPO | Admitting: Hematology

## 2019-04-25 VITALS — BP 110/71 | HR 80 | Temp 98.3°F | Resp 17 | Ht 68.0 in | Wt 118.9 lb

## 2019-04-25 DIAGNOSIS — Z170 Estrogen receptor positive status [ER+]: Secondary | ICD-10-CM | POA: Insufficient documentation

## 2019-04-25 DIAGNOSIS — C773 Secondary and unspecified malignant neoplasm of axilla and upper limb lymph nodes: Secondary | ICD-10-CM | POA: Insufficient documentation

## 2019-04-25 DIAGNOSIS — C50412 Malignant neoplasm of upper-outer quadrant of left female breast: Secondary | ICD-10-CM | POA: Insufficient documentation

## 2019-04-25 DIAGNOSIS — D693 Immune thrombocytopenic purpura: Secondary | ICD-10-CM | POA: Insufficient documentation

## 2019-04-25 DIAGNOSIS — F419 Anxiety disorder, unspecified: Secondary | ICD-10-CM | POA: Diagnosis not present

## 2019-04-25 DIAGNOSIS — Z79810 Long term (current) use of selective estrogen receptor modulators (SERMs): Secondary | ICD-10-CM | POA: Diagnosis not present

## 2019-04-25 DIAGNOSIS — Z923 Personal history of irradiation: Secondary | ICD-10-CM | POA: Insufficient documentation

## 2019-04-25 DIAGNOSIS — Z79899 Other long term (current) drug therapy: Secondary | ICD-10-CM | POA: Diagnosis not present

## 2019-04-25 DIAGNOSIS — Z9221 Personal history of antineoplastic chemotherapy: Secondary | ICD-10-CM | POA: Insufficient documentation

## 2019-04-25 DIAGNOSIS — Z9013 Acquired absence of bilateral breasts and nipples: Secondary | ICD-10-CM | POA: Insufficient documentation

## 2019-04-25 DIAGNOSIS — C50912 Malignant neoplasm of unspecified site of left female breast: Secondary | ICD-10-CM

## 2019-04-25 LAB — CBC WITH DIFFERENTIAL/PLATELET
Abs Immature Granulocytes: 0.01 10*3/uL (ref 0.00–0.07)
Basophils Absolute: 0 10*3/uL (ref 0.0–0.1)
Basophils Relative: 1 %
Eosinophils Absolute: 0.1 10*3/uL (ref 0.0–0.5)
Eosinophils Relative: 3 %
HCT: 40.9 % (ref 36.0–46.0)
Hemoglobin: 14 g/dL (ref 12.0–15.0)
Immature Granulocytes: 0 %
Lymphocytes Relative: 28 %
Lymphs Abs: 1.2 10*3/uL (ref 0.7–4.0)
MCH: 31.4 pg (ref 26.0–34.0)
MCHC: 34.2 g/dL (ref 30.0–36.0)
MCV: 91.7 fL (ref 80.0–100.0)
Monocytes Absolute: 0.4 10*3/uL (ref 0.1–1.0)
Monocytes Relative: 11 %
Neutro Abs: 2.4 10*3/uL (ref 1.7–7.7)
Neutrophils Relative %: 57 %
Platelets: 163 10*3/uL (ref 150–400)
RBC: 4.46 MIL/uL (ref 3.87–5.11)
RDW: 12.6 % (ref 11.5–15.5)
WBC: 4.2 10*3/uL (ref 4.0–10.5)
nRBC: 0 % (ref 0.0–0.2)

## 2019-04-25 LAB — COMPREHENSIVE METABOLIC PANEL
ALT: 12 U/L (ref 0–44)
AST: 14 U/L — ABNORMAL LOW (ref 15–41)
Albumin: 3.7 g/dL (ref 3.5–5.0)
Alkaline Phosphatase: 58 U/L (ref 38–126)
Anion gap: 10 (ref 5–15)
BUN: 13 mg/dL (ref 6–20)
CO2: 24 mmol/L (ref 22–32)
Calcium: 8.6 mg/dL — ABNORMAL LOW (ref 8.9–10.3)
Chloride: 107 mmol/L (ref 98–111)
Creatinine, Ser: 0.85 mg/dL (ref 0.44–1.00)
GFR calc Af Amer: >60 mL/min (ref >60)
GFR calc non Af Amer: >60 mL/min (ref >60)
Glucose, Bld: 87 mg/dL (ref 70–99)
Potassium: 4.5 mmol/L (ref 3.5–5.1)
Sodium: 141 mmol/L (ref 135–145)
Total Bilirubin: 0.2 mg/dL — ABNORMAL LOW (ref 0.3–1.2)
Total Protein: 6.2 g/dL — ABNORMAL LOW (ref 6.5–8.1)

## 2019-04-25 NOTE — Telephone Encounter (Signed)
Scheduled appt per 8/20 los. ° °Spoke with patient and she is aware of her appt date and time. °

## 2019-04-26 ENCOUNTER — Encounter: Payer: Self-pay | Admitting: Hematology

## 2019-04-26 LAB — CANCER ANTIGEN 27.29: CA 27.29: 7 U/mL (ref 0.0–38.6)

## 2019-04-28 ENCOUNTER — Encounter: Payer: Self-pay | Admitting: Hematology

## 2019-09-15 ENCOUNTER — Other Ambulatory Visit: Payer: Self-pay | Admitting: Hematology

## 2019-10-07 ENCOUNTER — Encounter: Payer: Self-pay | Admitting: Hematology

## 2019-10-23 NOTE — Progress Notes (Signed)
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, BRCA2+  SUMMARY OF ONCOLOGIC HISTORY: Oncology History Overview Note  Cancer Staging Breast cancer metastasized to axillary lymph node, left [facility]) Staging form: Breast, AJCC 8th Edition - Clinical stage from 03/22/2017: Stage Unknown (cTX, cN1, cM0, GX, ER: Positive, PR: Positive, HER2: Negative) - Signed by [name], MD on 03/29/2017 - Pathologic stage from 04/27/2017: Stage IB (pT1c, pN2a, cM0, G2, ER: Positive, PR: Positive, HER2: Negative) - Signed by [name], MD on 05/25/2017 \   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 ([place])  03/21/2017 Mammogram   Diagnostic mammogram bilateral 03/21/17 IMPRESSION: INCOMPLETE - ADDITIONAL IMAGING EVALUATION NEEDED 1. The 0.6 cm oval mass in the right breast at 11 o'clock middle depth is indeterminate. An ultrasound is recommended.  2. The 0.5cm round focal asymmetry in the left breast central to the nipple middle depth is indeterminate. An ultrasound is recommended.  3. Ultrasound of the palpable abnormalities in the axilla and far left lateral breast is recommended.  4. Multiple clusters of pleomorphic calcifications in the left upper outer breast anterior depth spanning a 2.5 cm area are highly suggestive of malignancy. A stereotactic biopsy is recommended.     03/21/2017 Imaging   US Breast bilateral 03/21/17 IMPRESSION: HIGHLY SUGGESTIVE OF MALIGNANCY 1. Six  distinct abnormal left axillary nodes, 2 of which correspond to the palpable lumps felt by the patient. Findings concerning for metastatic adenopathy. Ultrasound guided biopsy of one of these nodes is recommended.  2. Small 5 mm palpable, oval mass in the left breast 3:00 areolar margin is at an intermediate suspicion. Ultrasound guided biopsy is recommended.  3. Isoechoic 8 mm mass in the right breast 10:00 5 cm from the nipple is also an intermediate suspicion for malignancy, and ultrasound guided biopsy is recommended.  4. Stereotactic guided biopsy for the highly suspicious calcifications in the left breast also recommended as detailed in the mammography report.     03/22/2017 Initial Biopsy   Diagnosis 03/22/17 Lymph node, needle/core biopsy, left axillary node -METASTATIC CARCINOMA, SEE COMMENT Microscopic Comment   03/22/2017 Receptors her2   Lymph node biopsy ER 95% positive, strong staining, PR 5% positive, weak staining, HER-2 negative, with HER2/CEP17 ratio 1.23 and copy #3.95.   03/27/2017 Pathology Results   Diagnosis 03/27/17 1. Breast, left, needle core biopsy -FIBROADENOMATOID NODULE WITH CALCIFICATIONS 2.Breast, left, needle core biopsy -DUCTAL CARCINOMA IN SITU, HIGH GRADE -SEE COMMENT  3. Breast, right, needle, core biopsy -FIBROADENOMA -NO MALIGNANCY IDENTIFIED    03/29/2017 Initial Diagnosis   Breast cancer metastasized to axillary lymph node, left ([place])   04/07/2017 Imaging   CT CAP 04/07/17 IMPRESSION: 1. Several prominent left axillary lymph nodes which may correspond to biopsy proven axillary lymph node metastasis. 2. No thoracic adenopathy identified or evidence of distant metastatic disease.   04/07/2017 Imaging   Bone whole body scan 04/07/17 IMPRESSION: Negative for evidence of osseous metastatic disease.   04/17/2017 Genetic Testing   BRCA2 c.[id number] (p.Glu260Serfs*15) pathogenic mutation identified on the 9 gene  STAT panel.  The STAT Breast cancer  panel offered by [facility] includes sequencing and rearrangement analysis for the following 9 genes:  ATM, BRCA1, BRCA2, CDH1, CHEK2, PALB2, PTEN, STK11 and TP53.   The report date is April 17, 2017.  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  The Hereditary Gene Panel offered by [facility] includes sequencing and/or deletion duplication testing of the following 46 genes: APC, ATM, AXIN2, BARD1, BMPR1A, BRCA1, BRCA2, BRIP1, CDH1, CDKN2A (p14ARF), CDKN2A (p16INK4a), CHEK2, CTNNA1, DICER1, EPCAM (Deletion/duplication testing only), GREM1 (promoter region deletion/duplication testing only), KIT, MEN1, MLH1, MSH2, MSH3, MSH6, MUTYH, NBN, NF1, NHTL1, PALB2, PDGFRA, PMS2, POLD1, POLE, PTEN, RAD50, RAD51C, RAD51D, SDHB, SDHC, SDHD, SMAD4, SMARCA4. STK11, TP53, TSC1, TSC2, and VHL.  The following genes were evaluated for sequence changes only: SDHA and HOXB13 c.251G>A variant only.  The report date is April 29, 2017.    04/27/2017 Surgery   Surgey 04/27/17 BILATERAL SKIN SPARING MASTECTOMY and LEFT AXILLARY LYMPH NODE and BILATERAL BREAST RECONSTRUCTION WITH PLACEMENT OF TISSUE EXPANDER AND FLEX HD (ACELLULAR HYDRATED DERMIS) Bilateral BY Dr. Barry Dienes and Dr. Marla Roe    04/27/2017 Pathology Results    Diagnosis  04/27/17 1. Breast, simple mastectomy, Right - FIBROCYSTIC CHANGES WITH ADENOSIS. - USUAL DUCTAL HYPERPLASIA. - HEALING BIOPSY SITE. - THERE IS NO EVIDENCE OF MALIGNANCY. 2. Breast, simple mastectomy, Left - INVASIVE DUCTAL CARCINOMA, GRADE II/III, SPANNING 1.2 CM. - DUCTAL CARCINOMA IN SITU WITH CALCIFICATIONS, HIGH GRADE. - INVASIVE DUCTAL CARCINOMA IS FOCALLY PRESENT AT THE ANTERIOR MARGIN. - DUCTAL CARCINOMA IN SITU IS BROADLY 0.1 CM TO THE POSTERIOR MARGIN. - SEE ONCOLOGY TABLE BELOW. 3. Lymph nodes, regional resection, Left axillary - METASTATIC CARCINOMA IN 8 OF 21 LYMPH NODES  (8/21). - SEE COMMENT   06/09/2017 - 11/16/2017 Chemotherapy   Adriamycin and Cytoxan every 2 weeks, for 4 cycles 06/09/17-07/21/17, followed by weekly Abraxane and carboplatin for 12 weeks starting 08/11/17. Held Carbo with cycle 2 due to poor toleration. Changed to single agent Abraxane for Cycle 3 on 09/08/17. Completed on 11/16/17 with reduced Abraxane due to body aches.    12/06/2017 Surgery   BREAST CAPSULECTOMY WITH IMPLANT EXCHANGE and PLACEMENT OF BREAST IMPLANTS and REMOVAL PORT-A-CATH  by Dr. Marla Roe on 12/06/17   12/18/2017 Survivorship   Survivorship clinic with NP Mendel Ryder    01/03/2018 - 02/19/2018 Radiation Therapy   Radiation therapy 01/03/18 - 02/19/18.   03/2018 -  Anti-estrogen oral therapy   Adjuvant tamoxifen started in July 2019, switched to letrozole started 09/2018 after BSO but due to allergy reaction with hives, ithcing and swelling she d/c and restarted Tamoxifen.     08/31/2018 Surgery   08/31/2018 BSO With Dr. Denman George  Diagnosis 1. Ovary and fallopian tube, right RIGHT OVARY AND RIGHT FALLOPIAN TUBE: - UNREMARKABLE. - NO ENDOMETRIOSIS OR MALIGNANCY. 2. Ovary and fallopian tube, left LEFT OVARY AND LEFT FALLOPIAN TUBE: - UNREMARKABLE. - NO ENDOMETRIOSIS OR MALIGNANCY.      CURRENT THERAPY:  Adjuvant tamoxifen started in July 2019, switched to letrozole started 09/2018 after BSObut due to allergy reaction with hives, itching and swelling she d/c and restarted Tamoxifen in 09/2018.  INTERVAL HISTORY:  GIANELLE MCCAUL is here for a follow up of left breast cancer. She was last seen by me 6 months ago. She presents to the clinic alone. She notes she is doing well. She notes she moved lately. She notes she plans to take COVID19 vaccine when  she can. She is tolerating Tamoxifen with manageable hot flashes. She denies joint pain. She notes she continues to work out daily. She notes she eats but very strict healthy diet. She plans to see her Gyn for her pap smear every 3  years.  She notes she no longer has Xanax but has been manageable without it.    REVIEW OF SYSTEMS:   Constitutional: Denies fevers, chills or abnormal weight loss (+) manageable hot flashes.  Eyes: Denies blurriness of vision Ears, nose, mouth, throat, and face: Denies mucositis or sore throat Respiratory: Denies cough, dyspnea or wheezes Cardiovascular: Denies palpitation, chest discomfort or lower extremity swelling Gastrointestinal:  Denies nausea, heartburn or change in bowel habits Skin: Denies abnormal skin rashes Lymphatics: Denies new lymphadenopathy or easy bruising Neurological:Denies numbness, tingling or new weaknesses Behavioral/Psych: Mood is stable, no new changes  All other systems were reviewed with the patient and are negative.  MEDICAL HISTORY:  Past Medical History:  Diagnosis Date  . Anemia   . Anxiety   . BRCA2 positive   . Chronic ITP (idiopathic thrombocytopenia) ([facility])   . Family history of breast cancer   . Family history of ovarian cancer   . Headache    last migraine...9/24  . History of cancer chemotherapy    left breast 06-09-2017  to 11-16-2017  . History of cardiac murmur as a child    infant  . History of external beam radiation therapy    left breast   01-03-2018  to 02-19-2018  . MTHFR mutation ([place])   . PONV (postoperative nausea and vomiting)   . 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 [facility]) oncologist-- dr Burr Medico   dx 03-22-2017--- 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/  04-27-2018  s/p  bilateral mastectomy with left sln dissection's with reconstruction,  completed chemothearpy 11-16-2017,  completed radiation 02-19-2018,  started tamoxifen 07/ 2019  . Wears contact lenses     SURGICAL HISTORY: Past Surgical History:  Procedure Laterality Date  . AXILLARY LYMPH NODE DISSECTION Left 04/27/2017   Procedure: AXILLARY LYMPH NODE DISSECTION;  Surgeon: [name], MD;  Location: [place];  Service: General;  Laterality: Left;  . BILATERAL TOTAL MASTECTOMY WITH AXILLARY LYMPH NODE DISSECTION Bilateral 04/27/2017   Procedure: SKIN SPARING MASTECTOMY;  Surgeon: [name], MD;  Location: [place];  Service: General;  Laterality: Bilateral;  . BREAST CAPSULECTOMY WITH IMPLANT EXCHANGE Bilateral 12/06/2017   Procedure: BREAST CAPSULECTOMY WITH IMPLANT EXCHANGE;  Surgeon: Wallace Going, DO;  Location: [place];  Service: Plastics;  Laterality: Bilateral;  . BREAST RECONSTRUCTION WITH PLACEMENT OF TISSUE EXPANDER AND FLEX HD (ACELLULAR HYDRATED DERMIS) Bilateral 04/27/2017   Procedure: BILATERAL BREAST RECONSTRUCTION WITH PLACEMENT OF TISSUE EXPANDER AND FLEX HD (ACELLULAR HYDRATED DERMIS);  Surgeon: Wallace Going, DO;  Location: [place];  Service: Plastics;  Laterality: Bilateral;  . PLACEMENT OF BREAST IMPLANTS Bilateral 12/06/2017   Procedure: PLACEMENT OF BREAST IMPLANTS;  Surgeon: Wallace Going, DO;  Location: [place];  Service: Plastics;  Laterality: Bilateral;  . PORT-A-CATH REMOVAL Right 12/06/2017   Procedure: REMOVAL PORT-A-CATH;  Surgeon: Wallace Going, DO;  Location: [place];  Service: Plastics;  Laterality: Right;  . PORTACATH PLACEMENT Right 06/01/2017   Procedure: INSERTION PORT-A-CATH RIGHT SUBCLAVIAN;  Surgeon: [name], MD;  Location: [place];  Service: General;  Laterality: Right;  . ROBOTIC ASSISTED  SALPINGO OOPHERECTOMY Bilateral 08/31/2018   Procedure: XI ROBOTIC ASSISTED BILATERAL  SALPINGO OOPHORECTOMY;  Surgeon: Everitt Amber, MD;  Location: [facility];  Service: Gynecology;  Laterality: Bilateral;  . WISDOM TOOTH EXTRACTION      I have reviewed the social history and family history with the patient and they are unchanged from previous note.  ALLERGIES:  is allergic to clindamycin; penicillins; and  codeine.  MEDICATIONS:  Current Outpatient Medications  Medication Sig Dispense Refill  . b complex vitamins capsule Take 1 capsule by mouth daily.    . Cholecalciferol (VITAMIN D-3 PO) Take 1 capsule by mouth daily.    Marland Kitchen ibuprofen (ADVIL,MOTRIN) 800 MG tablet Take 1 tablet (800 mg total) by mouth every 8 (eight) hours as needed. 30 tablet 0  . MAGNESIUM PO Take 1 tablet by mouth daily. Take Muscle cramps    . Misc Natural Products (CALCIUM PLUS ADVANCED PO) Take 1 tablet by mouth daily.    . Multiple Vitamins-Calcium (ONE-A-DAY WOMENS PO) Take 1 tablet by mouth daily.    Marland Kitchen OVER THE COUNTER MEDICATION Take 1 capsule by mouth daily. Biotin/MSM    . tamoxifen (NOLVADEX) 20 MG tablet TAKE 1 TABLET BY MOUTH EVERY DAY 90 tablet 1  . ALPRAZolam (XANAX) 0.25 MG tablet Take 1 tablet (0.25 mg total) by mouth at bedtime as needed for anxiety. (Patient not taking: Reported on 04/25/2019) 30 tablet 0  . ascorbic acid (VITAMIN C) 500 MG tablet Take by mouth.     No current facility-administered medications for this visit.   Facility-Administered Medications Ordered in Other Visits  Medication Dose Route Frequency Provider Last Rate Last Admin  . sodium chloride flush (NS) 0.9 % injection 10 mL  10 mL Intravenous PRN [name], MD   10 mL at 07/21/17 1323    PHYSICAL EXAMINATION: ECOG PERFORMANCE STATUS: 0 - Asymptomatic  Vitals:   10/28/19 1417  BP: 106/66  Pulse: 81  Resp: 18  Temp: 98.2 F (36.8 C)  SpO2: 100%   Filed Weights   10/28/19 1417  Weight: 122 lb 1.6 oz (55.4 kg)    GENERAL:alert, no distress and comfortable SKIN: skin color, texture, turgor are normal, no rashes or significant lesions EYES: normal, Conjunctiva are pink and non-injected, sclera clear  NECK: supple, thyroid normal size, non-tender, without nodularity LYMPH:  no palpable lymphadenopathy in the cervical, axillary  LUNGS: clear to auscultation and percussion with normal breathing effort HEART: regular rate &  rhythm and no murmurs and no lower extremity edema ABDOMEN:abdomen soft, non-tender and normal bowel sounds Musculoskeletal:no cyanosis of digits and no clubbing  NEURO: alert & oriented x 3 with fluent speech, no focal motor/sensory deficits BREAST: s/p b/l mastectomy and reconstruction: Surgical incision healed well. No palpable mass, nodules or adenopathy bilaterally. Breast exam benign.   LABORATORY DATA:  I have reviewed the data as listed CBC Latest Ref Rng & Units 10/28/2019 04/25/2019 08/16/2018  WBC 4.0 - 10.5 K/uL 6.0 4.2 4.2  Hemoglobin 12.0 - 15.0 g/dL 13.3 14.0 13.7  Hematocrit 36.0 - 46.0 % 39.7 40.9 40.8  Platelets 150 - 400 K/uL 168 163 172     CMP Latest Ref Rng & Units 10/28/2019 04/25/2019 08/16/2018  Glucose 70 - 99 mg/dL 105(H) 87 101(H)  BUN 6 - 20 mg/dL 19 13 22(H)  Creatinine 0.44 - 1.00 mg/dL 0.96 0.85 0.94  Sodium 135 - 145 mmol/L 141 141 141  Potassium 3.5 - 5.1 mmol/L 4.5 4.5 4.7  Chloride 98 -  111 mmol/L 105 107 106  CO2 22 - 32 mmol/L '28 24 26  '$ Calcium 8.9 - 10.3 mg/dL 9.2 8.6(L) 9.4  Total Protein 6.5 - 8.1 g/dL 6.3(L) 6.2(L) 6.4(L)  Total Bilirubin 0.3 - 1.2 mg/dL <0.2(L) 0.2(L) 0.3  Alkaline Phos 38 - 126 U/L 51 58 61  AST 15 - 41 U/L 14(L) 14(L) 14(L)  ALT 0 - 44 U/L '13 12 11      '$ RADIOGRAPHIC STUDIES: I have personally reviewed the radiological images as listed and agreed with the findings in the report. No results found.   ASSESSMENT & PLAN:  PAULINA MUCHMORE is a 49 y.o. female with    1. 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 BRCA 2 +. Treated with b/l mastectomy, adjuvant chemo and radiation. -She startedanti-estrogen therapy withTamoxifen in 03/2018. -Shestopped Tamoxifen before BSOand started letrozole in 09/2018.Unfortunately she had allergic reaction to letrozole with hives, itching and swelling so she stopped and  restarted Tamoxifen in 09/2018. She is toleratingvery well overall withoccasional hot flashes and hair thinning. -On Tamoxifen I encouraged her to watch for Blood clots and encouraged her to watch for vaginal bleeding and continue to f/u with her Gyn.  -She is clinically doing well. Lab reviewed, her CBC and CMP are within normal limits except Protein 6.5. Her physical exam was unremarkable. There is no clinical concern for recurrence. -Will get baseline DEXA in 2021. She will continue Vitamin D.  -I recommend she eat adequate with increase protein.  -Continue surveillance. She has had a bilateral mastectomy, no need mammogram -Continue Tamoxifen  -f/u in 6 months  -I encouraged her to proceed with COVID19 vaccine when it becomes available to her. She is interested.   2. Chronic ITP -Diagnosed during her last pregnancy. -CBC has been normal in the past year.   3. Anxiety -She has been on Xanax for significant anxiety. She has ran out and able to manage without it.   4. BRCA2 MUTATION (+)  -She has had a bilateral mastectomy, and bilateral BSO -We will continue to monitor her other risk of cancer, such as pancreatic cancer, brain tumor, GU cancer clinically  -Her children will proceed with Genetic testing when they reach their 20s  Plan -She is clinically doing well -Continue tamoxifen -Lab and f/u in 6 months  -DEXA scan in 5-6 months.   No problem-specific Assessment & Plan notes found for this encounter.   Orders Placed This Encounter  Procedures  . DG Bone Density    Standing Status:   Future    Standing Expiration Date:   10/27/2020    Order Specific Question:   Reason for Exam (SYMPTOM  OR DIAGNOSIS REQUIRED)    Answer:   screening    Order Specific Question:   Is the patient pregnant?    Answer:   No    Order Specific Question:   Preferred imaging location?    Answer:   [facility]   All questions were answered. The patient knows to call the clinic with any  problems, questions or concerns. No barriers to learning was detected. The total time spent in the appointment was 25 minutes.     [name], MD 10/28/2019   I, Joslyn Devon, am acting as scribe for [name], MD.   I have reviewed the above documentation for accuracy and completeness, and I agree with the above.

## 2019-10-27 ENCOUNTER — Other Ambulatory Visit: Payer: Self-pay | Admitting: Hematology

## 2019-10-27 DIAGNOSIS — Z1321 Encounter for screening for nutritional disorder: Secondary | ICD-10-CM

## 2019-10-28 ENCOUNTER — Other Ambulatory Visit: Payer: Self-pay

## 2019-10-28 ENCOUNTER — Encounter: Payer: Self-pay | Admitting: Hematology

## 2019-10-28 ENCOUNTER — Inpatient Hospital Stay: Payer: BC Managed Care – PPO | Attending: Hematology

## 2019-10-28 ENCOUNTER — Inpatient Hospital Stay (HOSPITAL_BASED_OUTPATIENT_CLINIC_OR_DEPARTMENT_OTHER): Payer: BC Managed Care – PPO | Admitting: Hematology

## 2019-10-28 VITALS — BP 106/66 | HR 81 | Temp 98.2°F | Resp 18 | Ht 68.0 in | Wt 122.1 lb

## 2019-10-28 DIAGNOSIS — F419 Anxiety disorder, unspecified: Secondary | ICD-10-CM | POA: Insufficient documentation

## 2019-10-28 DIAGNOSIS — Z9221 Personal history of antineoplastic chemotherapy: Secondary | ICD-10-CM | POA: Diagnosis not present

## 2019-10-28 DIAGNOSIS — C50912 Malignant neoplasm of unspecified site of left female breast: Secondary | ICD-10-CM

## 2019-10-28 DIAGNOSIS — Z90722 Acquired absence of ovaries, bilateral: Secondary | ICD-10-CM | POA: Diagnosis not present

## 2019-10-28 DIAGNOSIS — Z8041 Family history of malignant neoplasm of ovary: Secondary | ICD-10-CM | POA: Insufficient documentation

## 2019-10-28 DIAGNOSIS — Z170 Estrogen receptor positive status [ER+]: Secondary | ICD-10-CM | POA: Diagnosis not present

## 2019-10-28 DIAGNOSIS — Z791 Long term (current) use of non-steroidal anti-inflammatories (NSAID): Secondary | ICD-10-CM | POA: Insufficient documentation

## 2019-10-28 DIAGNOSIS — E2839 Other primary ovarian failure: Secondary | ICD-10-CM

## 2019-10-28 DIAGNOSIS — Z79810 Long term (current) use of selective estrogen receptor modulators (SERMs): Secondary | ICD-10-CM | POA: Diagnosis not present

## 2019-10-28 DIAGNOSIS — Z79899 Other long term (current) drug therapy: Secondary | ICD-10-CM | POA: Diagnosis not present

## 2019-10-28 DIAGNOSIS — Z923 Personal history of irradiation: Secondary | ICD-10-CM | POA: Insufficient documentation

## 2019-10-28 DIAGNOSIS — Z1321 Encounter for screening for nutritional disorder: Secondary | ICD-10-CM

## 2019-10-28 DIAGNOSIS — Z9013 Acquired absence of bilateral breasts and nipples: Secondary | ICD-10-CM | POA: Insufficient documentation

## 2019-10-28 DIAGNOSIS — Z803 Family history of malignant neoplasm of breast: Secondary | ICD-10-CM | POA: Insufficient documentation

## 2019-10-28 DIAGNOSIS — C50412 Malignant neoplasm of upper-outer quadrant of left female breast: Secondary | ICD-10-CM | POA: Diagnosis not present

## 2019-10-28 DIAGNOSIS — D693 Immune thrombocytopenic purpura: Secondary | ICD-10-CM | POA: Insufficient documentation

## 2019-10-28 DIAGNOSIS — C773 Secondary and unspecified malignant neoplasm of axilla and upper limb lymph nodes: Secondary | ICD-10-CM | POA: Diagnosis not present

## 2019-10-28 DIAGNOSIS — N951 Menopausal and female climacteric states: Secondary | ICD-10-CM | POA: Diagnosis not present

## 2019-10-28 LAB — CBC WITH DIFFERENTIAL/PLATELET
Abs Immature Granulocytes: 0.03 10*3/uL (ref 0.00–0.07)
Basophils Absolute: 0 10*3/uL (ref 0.0–0.1)
Basophils Relative: 1 %
Eosinophils Absolute: 0.1 10*3/uL (ref 0.0–0.5)
Eosinophils Relative: 1 %
HCT: 39.7 % (ref 36.0–46.0)
Hemoglobin: 13.3 g/dL (ref 12.0–15.0)
Immature Granulocytes: 1 %
Lymphocytes Relative: 22 %
Lymphs Abs: 1.3 10*3/uL (ref 0.7–4.0)
MCH: 31 pg (ref 26.0–34.0)
MCHC: 33.5 g/dL (ref 30.0–36.0)
MCV: 92.5 fL (ref 80.0–100.0)
Monocytes Absolute: 0.5 10*3/uL (ref 0.1–1.0)
Monocytes Relative: 8 %
Neutro Abs: 4.1 10*3/uL (ref 1.7–7.7)
Neutrophils Relative %: 67 %
Platelets: 168 10*3/uL (ref 150–400)
RBC: 4.29 MIL/uL (ref 3.87–5.11)
RDW: 13.2 % (ref 11.5–15.5)
WBC: 6 10*3/uL (ref 4.0–10.5)
nRBC: 0 % (ref 0.0–0.2)

## 2019-10-28 LAB — COMPREHENSIVE METABOLIC PANEL
ALT: 13 U/L (ref 0–44)
AST: 14 U/L — ABNORMAL LOW (ref 15–41)
Albumin: 3.8 g/dL (ref 3.5–5.0)
Alkaline Phosphatase: 51 U/L (ref 38–126)
Anion gap: 8 (ref 5–15)
BUN: 19 mg/dL (ref 6–20)
CO2: 28 mmol/L (ref 22–32)
Calcium: 9.2 mg/dL (ref 8.9–10.3)
Chloride: 105 mmol/L (ref 98–111)
Creatinine, Ser: 0.96 mg/dL (ref 0.44–1.00)
GFR calc Af Amer: >60 mL/min (ref >60)
GFR calc non Af Amer: >60 mL/min (ref >60)
Glucose, Bld: 105 mg/dL — ABNORMAL HIGH (ref 70–99)
Potassium: 4.5 mmol/L (ref 3.5–5.1)
Sodium: 141 mmol/L (ref 135–145)
Total Bilirubin: <0.2 mg/dL — ABNORMAL LOW (ref 0.3–1.2)
Total Protein: 6.3 g/dL — ABNORMAL LOW (ref 6.5–8.1)

## 2019-10-29 ENCOUNTER — Telehealth: Payer: Self-pay | Admitting: Hematology

## 2019-10-29 ENCOUNTER — Encounter: Payer: Self-pay | Admitting: Hematology

## 2019-10-29 LAB — CANCER ANTIGEN 27.29: CA 27.29: 4.8 U/mL (ref 0.0–38.6)

## 2019-10-29 NOTE — Telephone Encounter (Signed)
Scheduled appt per 2/22 los.  Sent a message to HIM pool to get a calendar mailed out. 

## 2019-11-12 ENCOUNTER — Encounter: Payer: Self-pay | Admitting: Hematology

## 2019-11-16 ENCOUNTER — Encounter: Payer: Self-pay | Admitting: Hematology

## 2019-11-22 ENCOUNTER — Encounter: Payer: Self-pay | Admitting: Hematology

## 2019-11-27 ENCOUNTER — Encounter: Payer: Self-pay | Admitting: Hematology

## 2019-11-28 ENCOUNTER — Telehealth: Payer: Self-pay | Admitting: Hematology

## 2019-11-28 ENCOUNTER — Other Ambulatory Visit: Payer: Self-pay

## 2019-11-28 ENCOUNTER — Inpatient Hospital Stay: Payer: BC Managed Care – PPO | Attending: Hematology | Admitting: Nurse Practitioner

## 2019-11-28 ENCOUNTER — Inpatient Hospital Stay: Payer: BC Managed Care – PPO

## 2019-11-28 ENCOUNTER — Encounter: Payer: Self-pay | Admitting: Nurse Practitioner

## 2019-11-28 VITALS — BP 116/77 | HR 89 | Temp 98.5°F | Resp 18 | Ht 68.0 in | Wt 120.2 lb

## 2019-11-28 DIAGNOSIS — Z90722 Acquired absence of ovaries, bilateral: Secondary | ICD-10-CM | POA: Insufficient documentation

## 2019-11-28 DIAGNOSIS — C50412 Malignant neoplasm of upper-outer quadrant of left female breast: Secondary | ICD-10-CM | POA: Diagnosis not present

## 2019-11-28 DIAGNOSIS — D693 Immune thrombocytopenic purpura: Secondary | ICD-10-CM | POA: Insufficient documentation

## 2019-11-28 DIAGNOSIS — Z923 Personal history of irradiation: Secondary | ICD-10-CM | POA: Insufficient documentation

## 2019-11-28 DIAGNOSIS — Z79810 Long term (current) use of selective estrogen receptor modulators (SERMs): Secondary | ICD-10-CM | POA: Diagnosis not present

## 2019-11-28 DIAGNOSIS — R519 Headache, unspecified: Secondary | ICD-10-CM | POA: Diagnosis not present

## 2019-11-28 DIAGNOSIS — R202 Paresthesia of skin: Secondary | ICD-10-CM | POA: Diagnosis not present

## 2019-11-28 DIAGNOSIS — Z9013 Acquired absence of bilateral breasts and nipples: Secondary | ICD-10-CM | POA: Diagnosis not present

## 2019-11-28 DIAGNOSIS — Z803 Family history of malignant neoplasm of breast: Secondary | ICD-10-CM | POA: Insufficient documentation

## 2019-11-28 DIAGNOSIS — G629 Polyneuropathy, unspecified: Secondary | ICD-10-CM | POA: Insufficient documentation

## 2019-11-28 DIAGNOSIS — Z170 Estrogen receptor positive status [ER+]: Secondary | ICD-10-CM | POA: Diagnosis not present

## 2019-11-28 DIAGNOSIS — F419 Anxiety disorder, unspecified: Secondary | ICD-10-CM | POA: Insufficient documentation

## 2019-11-28 DIAGNOSIS — Z8041 Family history of malignant neoplasm of ovary: Secondary | ICD-10-CM | POA: Diagnosis not present

## 2019-11-28 DIAGNOSIS — M79609 Pain in unspecified limb: Secondary | ICD-10-CM | POA: Diagnosis not present

## 2019-11-28 DIAGNOSIS — Z9221 Personal history of antineoplastic chemotherapy: Secondary | ICD-10-CM | POA: Diagnosis not present

## 2019-11-28 DIAGNOSIS — C773 Secondary and unspecified malignant neoplasm of axilla and upper limb lymph nodes: Secondary | ICD-10-CM | POA: Insufficient documentation

## 2019-11-28 DIAGNOSIS — Z79899 Other long term (current) drug therapy: Secondary | ICD-10-CM | POA: Insufficient documentation

## 2019-11-28 LAB — CMP (CANCER CENTER ONLY)
ALT: 14 U/L (ref 0–44)
AST: 16 U/L (ref 15–41)
Albumin: 4.4 g/dL (ref 3.5–5.0)
Alkaline Phosphatase: 61 U/L (ref 38–126)
Anion gap: 8 (ref 5–15)
BUN: 15 mg/dL (ref 6–20)
CO2: 27 mmol/L (ref 22–32)
Calcium: 9.2 mg/dL (ref 8.9–10.3)
Chloride: 107 mmol/L (ref 98–111)
Creatinine: 0.96 mg/dL (ref 0.44–1.00)
GFR, Est AFR Am: >60 mL/min (ref >60)
GFR, Est Non Af Am: >60 mL/min (ref >60)
Glucose, Bld: 98 mg/dL (ref 70–99)
Potassium: 4.3 mmol/L (ref 3.5–5.1)
Sodium: 142 mmol/L (ref 135–145)
Total Bilirubin: 0.3 mg/dL (ref 0.3–1.2)
Total Protein: 7.1 g/dL (ref 6.5–8.1)

## 2019-11-28 LAB — VITAMIN B12: Vitamin B-12: 488 pg/mL (ref 180–914)

## 2019-11-28 LAB — CBC WITH DIFFERENTIAL (CANCER CENTER ONLY)
Abs Immature Granulocytes: 0.01 10*3/uL (ref 0.00–0.07)
Basophils Absolute: 0 10*3/uL (ref 0.0–0.1)
Basophils Relative: 1 %
Eosinophils Absolute: 0.1 10*3/uL (ref 0.0–0.5)
Eosinophils Relative: 1 %
HCT: 44 % (ref 36.0–46.0)
Hemoglobin: 14.6 g/dL (ref 12.0–15.0)
Immature Granulocytes: 0 %
Lymphocytes Relative: 26 %
Lymphs Abs: 1.5 10*3/uL (ref 0.7–4.0)
MCH: 30 pg (ref 26.0–34.0)
MCHC: 33.2 g/dL (ref 30.0–36.0)
MCV: 90.5 fL (ref 80.0–100.0)
Monocytes Absolute: 0.5 10*3/uL (ref 0.1–1.0)
Monocytes Relative: 9 %
Neutro Abs: 3.6 10*3/uL (ref 1.7–7.7)
Neutrophils Relative %: 63 %
Platelet Count: 195 10*3/uL (ref 150–400)
RBC: 4.86 MIL/uL (ref 3.87–5.11)
RDW: 12.9 % (ref 11.5–15.5)
WBC Count: 5.7 10*3/uL (ref 4.0–10.5)
nRBC: 0 % (ref 0.0–0.2)

## 2019-11-28 LAB — URINALYSIS, COMPLETE (UACMP) WITH MICROSCOPIC
Bilirubin Urine: NEGATIVE
Glucose, UA: NEGATIVE mg/dL
Hgb urine dipstick: NEGATIVE
Ketones, ur: NEGATIVE mg/dL
Leukocytes,Ua: NEGATIVE
Nitrite: NEGATIVE
Protein, ur: NEGATIVE mg/dL
Specific Gravity, Urine: 1.011 (ref 1.005–1.030)
pH: 5 (ref 5.0–8.0)

## 2019-11-28 LAB — IRON AND TIBC
Iron: 135 ug/dL (ref 41–142)
Saturation Ratios: 36 % (ref 21–57)
TIBC: 375 ug/dL (ref 236–444)
UIBC: 240 ug/dL (ref 120–384)

## 2019-11-28 LAB — MAGNESIUM: Magnesium: 2 mg/dL (ref 1.7–2.4)

## 2019-11-28 LAB — TSH: TSH: 0.66 u[IU]/mL (ref 0.308–3.960)

## 2019-11-28 LAB — FERRITIN: Ferritin: 33 ng/mL (ref 11–307)

## 2019-11-28 NOTE — Progress Notes (Signed)
[place]   [phone] [phone]   Clinic Follow up Note   Patient Care Team: Dorena Cookey, MD (Inactive) as PCP - General [name], MD as Consulting Physician (General Surgery) [name], MD as Consulting Physician (Hematology) Kyung Rudd, MD as Consulting Physician (Radiation Oncology) Brien Few, MD as Consulting Physician (Obstetrics and Gynecology) Gardenia Phlegm, NP as Nurse Practitioner (Hematology and Oncology) 11/28/2019  CHIEF COMPLAINT: F/u back pain, left leg numbness, urinary frequency, headache, fatigue   SUMMARY OF ONCOLOGIC HISTORY: Oncology History Overview Note  Cancer Staging Breast cancer metastasized to axillary lymph node, left ([place]) Staging form: Breast, AJCC 8th Edition - Clinical stage from 03/22/2017: Stage Unknown (cTX, cN1, cM0, GX, ER: Positive, PR: Positive, HER2: Negative) - Signed by [name], MD on 03/29/2017 - Pathologic stage from 04/27/2017: Stage IB (pT1c, pN2a, cM0, G2, ER: Positive, PR: Positive, HER2: Negative) - Signed by [name], MD on 05/25/2017 \   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 ([place])  03/21/2017 Mammogram   Diagnostic mammogram bilateral 03/21/17 IMPRESSION: INCOMPLETE - ADDITIONAL IMAGING EVALUATION NEEDED 1. The 0.6 cm oval mass in the right breast at 11 o'clock middle depth is indeterminate. An ultrasound is recommended.  2. The 0.5cm round focal asymmetry in the left breast central to the nipple middle depth is indeterminate. An ultrasound is recommended.  3. Ultrasound of the palpable abnormalities in the axilla and far left lateral breast is recommended.  4. Multiple clusters of pleomorphic calcifications in the left upper outer breast anterior depth spanning a 2.5 cm area are highly suggestive of malignancy. A stereotactic biopsy is recommended.     03/21/2017 Imaging   US Breast bilateral 03/21/17 IMPRESSION: HIGHLY SUGGESTIVE OF  MALIGNANCY 1. Six distinct abnormal left axillary nodes, 2 of which correspond to the palpable lumps felt by the patient. Findings concerning for metastatic adenopathy. Ultrasound guided biopsy of one of these nodes is recommended.  2. Small 5 mm palpable, oval mass in the left breast 3:00 areolar margin is at an intermediate suspicion. Ultrasound guided biopsy is recommended.  3. Isoechoic 8 mm mass in the right breast 10:00 5 cm from the nipple is also an intermediate suspicion for malignancy, and ultrasound guided biopsy is recommended.  4. Stereotactic guided biopsy for the highly suspicious calcifications in the left breast also recommended as detailed in the mammography report.     03/22/2017 Initial Biopsy   Diagnosis 03/22/17 Lymph node, needle/core biopsy, left axillary node -METASTATIC CARCINOMA, SEE COMMENT Microscopic Comment   03/22/2017 Receptors her2   Lymph node biopsy ER 95% positive, strong staining, PR 5% positive, weak staining, HER-2 negative, with HER2/CEP17 ratio 1.23 and copy #3.95.   03/27/2017 Pathology Results   Diagnosis 03/27/17 1. Breast, left, needle core biopsy -FIBROADENOMATOID NODULE WITH CALCIFICATIONS 2.Breast, left, needle core biopsy -DUCTAL CARCINOMA IN SITU, HIGH GRADE -SEE COMMENT  3. Breast, right, needle, core biopsy -FIBROADENOMA -NO MALIGNANCY IDENTIFIED    03/29/2017 Initial Diagnosis   Breast cancer metastasized to axillary lymph node, left ([place])   04/07/2017 Imaging   CT CAP 04/07/17 IMPRESSION: 1. Several prominent left axillary lymph nodes which may correspond to biopsy proven axillary lymph node metastasis. 2. No thoracic adenopathy identified or evidence of distant metastatic disease.   04/07/2017 Imaging   Bone whole body scan 04/07/17 IMPRESSION: Negative for evidence of osseous metastatic disease.   04/17/2017 Genetic Testing   BRCA2 c.778-779delGA (p.Glu260Serfs*15) pathogenic mutation identified on the 9 gene  STAT panel.  The STAT  Breast cancer panel offered by [facility] includes sequencing and rearrangement analysis for the following 9 genes:  ATM, BRCA1, BRCA2, CDH1, CHEK2, PALB2, PTEN, STK11 and TP53.   The report date is April 17, 2017.  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  The Hereditary Gene Panel offered by [facility] includes sequencing and/or deletion duplication testing of the following 46 genes: APC, ATM, AXIN2, BARD1, BMPR1A, BRCA1, BRCA2, BRIP1, CDH1, CDKN2A (p14ARF), CDKN2A (p16INK4a), CHEK2, CTNNA1, DICER1, EPCAM (Deletion/duplication testing only), GREM1 (promoter region deletion/duplication testing only), KIT, MEN1, MLH1, MSH2, MSH3, MSH6, MUTYH, NBN, NF1, NHTL1, PALB2, PDGFRA, PMS2, POLD1, POLE, PTEN, RAD50, RAD51C, RAD51D, SDHB, SDHC, SDHD, SMAD4, SMARCA4. STK11, TP53, TSC1, TSC2, and VHL.  The following genes were evaluated for sequence changes only: SDHA and HOXB13 c.251G>A variant only.  The report date is April 29, 2017.    04/27/2017 Surgery   Surgey 04/27/17 BILATERAL SKIN SPARING MASTECTOMY and LEFT AXILLARY LYMPH NODE and BILATERAL BREAST RECONSTRUCTION WITH PLACEMENT OF TISSUE EXPANDER AND FLEX HD (ACELLULAR HYDRATED DERMIS) Bilateral BY Dr. Barry Dienes and Dr. Marla Roe    04/27/2017 Pathology Results    Diagnosis  04/27/17 1. Breast, simple mastectomy, Right - FIBROCYSTIC CHANGES WITH ADENOSIS. - USUAL DUCTAL HYPERPLASIA. - HEALING BIOPSY SITE. - THERE IS NO EVIDENCE OF MALIGNANCY. 2. Breast, simple mastectomy, Left - INVASIVE DUCTAL CARCINOMA, GRADE II/III, SPANNING 1.2 CM. - DUCTAL CARCINOMA IN SITU WITH CALCIFICATIONS, HIGH GRADE. - INVASIVE DUCTAL CARCINOMA IS FOCALLY PRESENT AT THE ANTERIOR MARGIN. - DUCTAL CARCINOMA IN SITU IS BROADLY 0.1 CM TO THE POSTERIOR MARGIN. - SEE ONCOLOGY TABLE BELOW. 3. Lymph nodes, regional resection, Left axillary - METASTATIC CARCINOMA IN 8 OF 21  LYMPH NODES (8/21). - SEE COMMENT   06/09/2017 - 11/16/2017 Chemotherapy   Adriamycin and Cytoxan every 2 weeks, for 4 cycles 06/09/17-07/21/17, followed by weekly Abraxane and carboplatin for 12 weeks starting 08/11/17. Held Carbo with cycle 2 due to poor toleration. Changed to single agent Abraxane for Cycle 3 on 09/08/17. Completed on 11/16/17 with reduced Abraxane due to body aches.    12/06/2017 Surgery   BREAST CAPSULECTOMY WITH IMPLANT EXCHANGE and PLACEMENT OF BREAST IMPLANTS and REMOVAL PORT-A-CATH  by Dr. Marla Roe on 12/06/17   12/18/2017 Survivorship   Survivorship clinic with NP Mendel Ryder    01/03/2018 - 02/19/2018 Radiation Therapy   Radiation therapy 01/03/18 - 02/19/18.   03/2018 -  Anti-estrogen oral therapy   Adjuvant tamoxifen started in July 2019, switched to letrozole started 09/2018 after BSO but due to allergy reaction with hives, ithcing and swelling she d/c and restarted Tamoxifen.     08/31/2018 Surgery   08/31/2018 BSO With Dr. Denman George  Diagnosis 1. Ovary and fallopian tube, right RIGHT OVARY AND RIGHT FALLOPIAN TUBE: - UNREMARKABLE. - NO ENDOMETRIOSIS OR MALIGNANCY. 2. Ovary and fallopian tube, left LEFT OVARY AND LEFT FALLOPIAN TUBE: - UNREMARKABLE. - NO ENDOMETRIOSIS OR MALIGNANCY.     CURRENT THERAPY: Tamoxifen for h/o breast cancer  INTERVAL HISTORY: Ms. Erdmann presents for symptom management visit. She was last seen by Dr. Burr Medico on 10/28/19 for routine breast cancer surveillance f/u. She noticed her hip "felt weird" during a series of Mychart messages back on 10/29/19. She attributed to heavy stretching/yoga for 2 hours per day. Early 11/2019 she reported intermittent "pins and needles" in legs and feet. Symptoms were attributed to possible residual chemo effects vs tamoxifen. She takes many supplements including calcium,  mag, B12. On 3/19 she informed us she had seen chiropractor and ortho who thought sciatica. xrays of foot, calf, and hip were good which I do not have  access to those reports. She went back to ortho on 3/23 who does not thinks this is sciatica now. She also reported urinary frequency, headaches, fatigue, lower left back pain, and numbness in both legs and under knee. She was brought in for a visit.   Ms. Kreis presents alone today. She is doing fine. She remains physically active with 2-3 hour workouts daily which is her baseline. She pushes herself to limits. 3 weeks ago she helped move a heavy farm table. Part of it fell on her left foot, no significant pain but felt a "springing" sensation. She then sanded it for 2 days straight with repetitive motion. Few days later she felt "hit by a truck" with sore muscles, particular left side which is normal for her with more activity, and developed intermittent left foot numbness. Few days later she had first covid19 vaccine and became febrile and severely fatigued for 5 days. Few days after recovery, she developed left low back and flank pain. Flank pain has happened in the past related to MSK changes from mastectomy. Her chiropractor felt left SI joint was hard, this was loosened and she did exercises at home. She started feeling "shaky" with headache and persistent left leg numbness that then went to right leg. Orthopedist thought left sciatica at first then decided otherwise. Her legs occasionally feel weak. No upper extremity weakness. Periodically she has numbness of the left 4th and 5th fingers.   Otherwise, she feels more thirsty lately, occasionally shaky, and headaches that feel like low blood sugar. Headaches improved after massage work on her neck. She voids often but denies dysuria or urgency. She doesn't usually eat until after she completes morning work out. Denies vision change. Takes NSAIDs occasionally which upsets her stomach. Denies other pain. Continues tamoxifen, tolerates very well. Denies new concerns with her breast/implants. Left breast is usually higher related to musculature.     MEDICAL HISTORY:  Past Medical History:  Diagnosis Date  . Anemia   . Anxiety   . BRCA2 positive   . Chronic ITP (idiopathic thrombocytopenia) ([facility])   . Family history of breast cancer   . Family history of ovarian cancer   . Headache    last migraine...9/24  . History of cancer chemotherapy    left breast 06-09-2017  to 11-16-2017  . History of cardiac murmur as a child    infant  . History of external beam radiation therapy    left breast   01-03-2018  to 02-19-2018  . MTHFR mutation ([place])   . PONV (postoperative nausea and vomiting)   . 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 [facility]) oncologist-- dr Burr Medico   dx 03-22-2017--- 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/  04-27-2018  s/p  bilateral mastectomy with left sln dissection's with reconstruction,  completed chemothearpy 11-16-2017,  completed radiation 02-19-2018,  started tamoxifen 07/ 2019  . Wears contact lenses     SURGICAL HISTORY: Past Surgical History:  Procedure Laterality Date  . AXILLARY LYMPH NODE DISSECTION Left 04/27/2017   Procedure: AXILLARY LYMPH NODE DISSECTION;  Surgeon: [name], MD;  Location: [place];  Service: General;  Laterality: Left;  . BILATERAL TOTAL MASTECTOMY WITH AXILLARY LYMPH NODE DISSECTION Bilateral 04/27/2017   Procedure: SKIN SPARING MASTECTOMY;  Surgeon:  [name], MD;  Location: [place];  Service: General;  Laterality: Bilateral;  . BREAST CAPSULECTOMY WITH IMPLANT EXCHANGE Bilateral 12/06/2017   Procedure: BREAST CAPSULECTOMY WITH IMPLANT EXCHANGE;  Surgeon: Wallace Going, DO;  Location: [place];  Service: Plastics;  Laterality: Bilateral;  . BREAST RECONSTRUCTION WITH PLACEMENT OF TISSUE EXPANDER AND FLEX HD (ACELLULAR HYDRATED DERMIS) Bilateral 04/27/2017   Procedure: BILATERAL BREAST RECONSTRUCTION WITH PLACEMENT OF TISSUE EXPANDER AND FLEX HD (ACELLULAR HYDRATED  DERMIS);  Surgeon: Wallace Going, DO;  Location: [place];  Service: Plastics;  Laterality: Bilateral;  . PLACEMENT OF BREAST IMPLANTS Bilateral 12/06/2017   Procedure: PLACEMENT OF BREAST IMPLANTS;  Surgeon: Wallace Going, DO;  Location: [place];  Service: Plastics;  Laterality: Bilateral;  . PORT-A-CATH REMOVAL Right 12/06/2017   Procedure: REMOVAL PORT-A-CATH;  Surgeon: Wallace Going, DO;  Location: [place];  Service: Plastics;  Laterality: Right;  . PORTACATH PLACEMENT Right 06/01/2017   Procedure: INSERTION PORT-A-CATH RIGHT SUBCLAVIAN;  Surgeon: [name], MD;  Location: [place];  Service: General;  Laterality: Right;  . ROBOTIC ASSISTED SALPINGO OOPHERECTOMY Bilateral 08/31/2018   Procedure: XI ROBOTIC ASSISTED BILATERAL  SALPINGO OOPHORECTOMY;  Surgeon: Everitt Amber, MD;  Location: [facility];  Service: Gynecology;  Laterality: Bilateral;  . WISDOM TOOTH EXTRACTION      I have reviewed the social history and family history with the patient and they are unchanged from previous note.  ALLERGIES:  is allergic to clindamycin; penicillins; and codeine.  MEDICATIONS:  Current Outpatient Medications  Medication Sig Dispense Refill  . ALPRAZolam (XANAX) 0.25 MG tablet Take 1 tablet (0.25 mg total) by mouth at bedtime as needed for anxiety. 30 tablet 0  . ascorbic acid (VITAMIN C) 500 MG tablet Take by mouth.    Marland Kitchen b complex vitamins capsule Take 1 capsule by mouth daily.    . Cholecalciferol (VITAMIN D-3 PO) Take 1 capsule by mouth daily.    Marland Kitchen ibuprofen (ADVIL,MOTRIN) 800 MG tablet Take 1 tablet (800 mg total) by mouth every 8 (eight) hours as needed. 30 tablet 0  . MAGNESIUM PO Take 1 tablet by mouth daily. Take Muscle cramps    . Misc Natural Products (CALCIUM PLUS ADVANCED PO) Take 1 tablet by mouth daily.    . Multiple Vitamins-Calcium (ONE-A-DAY WOMENS PO) Take 1 tablet by mouth daily.    Marland Kitchen OVER THE  COUNTER MEDICATION Take 1 capsule by mouth daily. Biotin/MSM    . tamoxifen (NOLVADEX) 20 MG tablet TAKE 1 TABLET BY MOUTH EVERY DAY 90 tablet 1   No current facility-administered medications for this visit.   Facility-Administered Medications Ordered in Other Visits  Medication Dose Route Frequency Provider Last Rate Last Admin  . sodium chloride flush (NS) 0.9 % injection 10 mL  10 mL Intravenous PRN [name], MD   10 mL at 07/21/17 1323    PHYSICAL EXAMINATION:  Vitals:   11/28/19 0956  BP: 116/77  Pulse: 89  Resp: 18  Temp: 98.5 F (36.9 C)  SpO2: 100%   Filed Weights   11/28/19 0956  Weight: 120 lb 3.2 oz (54.5 kg)    GENERAL:alert, no distress and comfortable SKIN: no obvious rash  EYES:  sclera clear NECK: without mass LYMPH:  no palpable cervical, supraclavicular, or axillary lymphadenopathy  Chest/LUNGS: clear with normal breathing effort. No nodularity along the chest wall. Left breast implant higher than right.  HEART: regular rate & rhythm,  no lower extremity edema ABDOMEN: no CVAT Musculoskeletal:no focal spinal, scapular, hip, rib or other joint tenderness throughout the skeleton. Equal and full ROM upper and lower extremities.  NEURO: alert & oriented x 3 with fluent speech. Normal sensation, movement, and strength. Reflexes intact and normal. No vibratory sensory deficits per tuning fork exam PAC removed Complete breast exam deferred   LABORATORY DATA:  I have reviewed the data as listed CBC Latest Ref Rng & Units 11/28/2019 10/28/2019 04/25/2019  WBC 4.0 - 10.5 K/uL 5.7 6.0 4.2  Hemoglobin 12.0 - 15.0 g/dL 14.6 13.3 14.0  Hematocrit 36.0 - 46.0 % 44.0 39.7 40.9  Platelets 150 - 400 K/uL 195 168 163     CMP Latest Ref Rng & Units 11/28/2019 10/28/2019 04/25/2019  Glucose 70 - 99 mg/dL 98 105(H) 87  BUN 6 - 20 mg/dL '15 19 13  '$ Creatinine 0.44 - 1.00 mg/dL 0.96 0.96 0.85  Sodium 135 - 145 mmol/L 142 141 141  Potassium 3.5 - 5.1 mmol/L 4.3 4.5 4.5    Chloride 98 - 111 mmol/L 107 105 107  CO2 22 - 32 mmol/L '27 28 24  '$ Calcium 8.9 - 10.3 mg/dL 9.2 9.2 8.6(L)  Total Protein 6.5 - 8.1 g/dL 7.1 6.3(L) 6.2(L)  Total Bilirubin 0.3 - 1.2 mg/dL 0.3 <0.2(L) 0.2(L)  Alkaline Phos 38 - 126 U/L 61 51 58  AST 15 - 41 U/L 16 14(L) 14(L)  ALT 0 - 44 U/L '14 13 12      '$ RADIOGRAPHIC STUDIES: I have personally reviewed the radiological images as listed and agreed with the findings in the report. No results found.   ASSESSMENT & PLAN: 49 yo female with   1. 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 She has been on tamoxifen without previous bone/joint pain  -MSK/neuro exam is benign today -labs including CMP, B12, Mg all normal  -She is being referred for MRI of the spine for further evaluation -If MRI is negative, will refer to neuro    2. Fatigue, headache -fatigue is likely multifactorial -headaches improved after massage therapy  -CBC and TSH, and BG normal today  -if headaches worsen, will consider brain MRI  3. Urinary frequency -UA shows bacteriuria, otherwise negative, no strong evidence of UTI.  -Culture is pending    4. 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 BRCA 2 +. Treated withb/l mastectomy, adjuvant chemo and radiation. -S/p BSO for BRCA2(+) she changed from tamoxifen to AI, but had allergic reaction and switched back to tamoxifen, tolerating well -as per last routine surveillance visit on 10/28/19, no clinical concern for recurrence -continues surveillance   PLAN: -Lab today, reviewed with patient  -MRI of the  spine pending -Phone f/u with results  -If MRI is negative, will refer to neuro   -No problem-specific Assessment & Plan notes found for this encounter.   Orders Placed This Encounter  Procedures  . Urine Culture    Standing Status:   Future    Number of Occurrences:   1    Standing Expiration Date:   11/27/2020  . MR CERVICAL SPINE W WO CONTRAST  Standing Status:   Future    Standing Expiration Date:   01/27/2021    Order Specific Question:   ** REASON FOR EXAM (FREE TEXT)    Answer:   back pain and paresthesias, h/o breast cancer    Order Specific Question:   If indicated for the ordered procedure, I authorize the administration of contrast media per Radiology protocol    Answer:   Yes    Order Specific Question:   What is the patient's sedation requirement?    Answer:   No Sedation    Order Specific Question:   Does the patient have a pacemaker or implanted devices?    Answer:   No    Order Specific Question:   Use SRS Protocol?    Answer:   No    Order Specific Question:   Radiology Contrast Protocol - do NOT remove file path    Answer:   [email or web address]    Order Specific Question:   Preferred imaging location?    Answer:   [facility] (table limit-350 lbs)  . MR THORACIC SPINE W WO CONTRAST    Standing Status:   Future    Standing Expiration Date:   01/27/2021    Order Specific Question:   ** REASON FOR EXAM (FREE TEXT)    Answer:   back pain and paresthesias, h/o breast cancer    Order Specific Question:   GRA to provide read?    Answer:   Yes    Order Specific Question:   If indicated for the ordered procedure, I authorize the administration of contrast media per Radiology protocol    Answer:   Yes    Order Specific Question:   What is the patient's sedation requirement?    Answer:   No Sedation    Order Specific Question:   Use SRS Protocol?    Answer:   No    Order Specific Question:   Does the patient have a pacemaker or  implanted devices?    Answer:   No    Order Specific Question:   Preferred imaging location?    Answer:   [facility] (table limit-350 lbs)    Order Specific Question:   Radiology Contrast Protocol - do NOT remove file path    Answer:   [email or web address]  . MR Lumbar Spine W Wo Contrast    Standing Status:   Future    Standing Expiration Date:   01/27/2021    Order Specific Question:   ** REASON FOR EXAM (FREE TEXT)    Answer:   back pain and paresthesias, h/o breast cancer    Order Specific Question:   If indicated for the ordered procedure, I authorize the administration of contrast media per Radiology protocol    Answer:   Yes    Order Specific Question:   What is the patient's sedation requirement?    Answer:   No Sedation    Order Specific Question:   Does the patient have a pacemaker or implanted devices?    Answer:   No    Order Specific Question:   Use SRS Protocol?    Answer:   No    Order Specific Question:   Radiology Contrast Protocol - do NOT remove file path    Answer:   [email or web address]    Order Specific Question:   Preferred imaging location?    Answer:   [facility] (table limit-350 lbs)  . CBC with Differential ([place])  Standing Status:   Future    Number of Occurrences:   1    Standing Expiration Date:   11/27/2020  . CMP ([place] only)    Standing Status:   Future    Number of Occurrences:   1    Standing Expiration Date:   11/27/2020  . TSH    Standing Status:   Future    Number of Occurrences:   1    Standing Expiration Date:   11/27/2020  . Iron and TIBC    Standing Status:   Future    Number of Occurrences:   1    Standing Expiration Date:   11/27/2020  . Ferritin    Standing Status:   Future    Number of Occurrences:   1    Standing Expiration Date:   11/27/2020  . Urinalysis, Complete w Microscopic    Standing Status:   Future    Number of Occurrences:   1     Standing Expiration Date:   11/27/2020  . Magnesium    Standing Status:   Future    Number of Occurrences:   1    Standing Expiration Date:   11/27/2020  . Vitamin B12    Standing Status:   Future    Number of Occurrences:   1    Standing Expiration Date:   11/27/2020  . Draw extra clot specimen    Standing Status:   Future    Number of Occurrences:   1    Standing Expiration Date:   11/27/2020   All questions were answered. The patient knows to call the clinic with any problems, questions or concerns. No barriers to learning were detected. I spent a total time of 40 minutes in today's encounter.      Alla Feeling, NP 11/28/19

## 2019-11-28 NOTE — Telephone Encounter (Signed)
Scheduled per los. Patient declined printout  

## 2019-11-29 LAB — URINE CULTURE

## 2019-12-02 ENCOUNTER — Other Ambulatory Visit: Payer: Self-pay | Admitting: Nurse Practitioner

## 2019-12-02 DIAGNOSIS — M79609 Pain in unspecified limb: Secondary | ICD-10-CM

## 2019-12-02 DIAGNOSIS — R202 Paresthesia of skin: Secondary | ICD-10-CM

## 2019-12-04 ENCOUNTER — Encounter: Payer: Self-pay | Admitting: Nurse Practitioner

## 2019-12-05 ENCOUNTER — Telehealth: Payer: Self-pay

## 2019-12-05 NOTE — Telephone Encounter (Signed)
Left voicemail for patient to call back [facility] 

## 2019-12-05 NOTE — Telephone Encounter (Signed)
TC to pt per Madison Rue NP to let her know that I called and moved up her MRI appointment this morning to the next available appointment date. It is now scheduled for Saturday 12/14/19 @1 :45, 3:15, and 5:00. Patient ok with change and is aware of appointment times, date, and location [facility]).

## 2019-12-10 ENCOUNTER — Telehealth: Payer: Self-pay | Admitting: Nurse Practitioner

## 2019-12-10 NOTE — Telephone Encounter (Signed)
Called per 4/6 sch message - unable to reach pt . Left message with new appt date and time

## 2019-12-11 ENCOUNTER — Encounter: Payer: Self-pay | Admitting: Nurse Practitioner

## 2019-12-12 ENCOUNTER — Other Ambulatory Visit: Payer: Self-pay | Admitting: Nurse Practitioner

## 2019-12-12 ENCOUNTER — Ambulatory Visit: Payer: BC Managed Care – PPO | Admitting: Nurse Practitioner

## 2019-12-12 DIAGNOSIS — J329 Chronic sinusitis, unspecified: Secondary | ICD-10-CM

## 2019-12-12 MED ORDER — METHYLPREDNISOLONE 4 MG PO TBPK: ORAL_TABLET | ORAL | 0 refills | Status: DC

## 2019-12-12 MED ORDER — AZITHROMYCIN 250 MG PO TABS: ORAL_TABLET | ORAL | 0 refills | Status: DC

## 2019-12-14 ENCOUNTER — Encounter (HOSPITAL_BASED_OUTPATIENT_CLINIC_OR_DEPARTMENT_OTHER): Payer: Self-pay

## 2019-12-14 ENCOUNTER — Ambulatory Visit (HOSPITAL_BASED_OUTPATIENT_CLINIC_OR_DEPARTMENT_OTHER)
Admission: RE | Admit: 2019-12-14 | Discharge: 2019-12-14 | Disposition: A | Discharge status: Home / Self Care | Payer: BC Managed Care – PPO | Source: Ambulatory Visit | Attending: Nurse Practitioner | Admitting: Nurse Practitioner

## 2019-12-14 ENCOUNTER — Other Ambulatory Visit: Payer: Self-pay

## 2019-12-14 DIAGNOSIS — M79609 Pain in unspecified limb: Secondary | ICD-10-CM

## 2019-12-14 DIAGNOSIS — M549 Dorsalgia, unspecified: Secondary | ICD-10-CM | POA: Diagnosis not present

## 2019-12-14 DIAGNOSIS — R202 Paresthesia of skin: Secondary | ICD-10-CM

## 2019-12-14 DIAGNOSIS — M5134 Other intervertebral disc degeneration, thoracic region: Secondary | ICD-10-CM | POA: Diagnosis not present

## 2019-12-14 DIAGNOSIS — M50221 Other cervical disc displacement at C4-C5 level: Secondary | ICD-10-CM | POA: Diagnosis not present

## 2019-12-14 IMAGING — MR MR LUMBAR SPINE W/O CM
5 of 7 series · 27 of 48 positions shown · non-contrast
Comparison: None available.

CLINICAL DATA: Initial evaluation for back pain with radiation into
the bilateral lower extremities for 5 weeks.

EXAM:
MRI LUMBAR SPINE WITHOUT CONTRAST
TECHNIQUE: Multiplanar, multisequence MR imaging of the lumbar spine was
performed. No intravenous contrast was administered.

[Series 2: T2 · sagittal · 4.0mm · 0.81mm/px · 5 of 14 slices shown (1 of 2)]
[im 1/14]
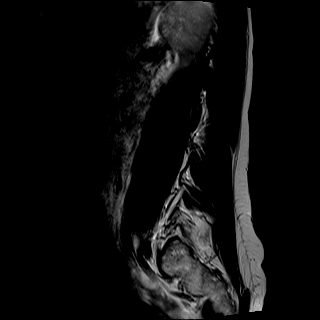
[im 4/14]
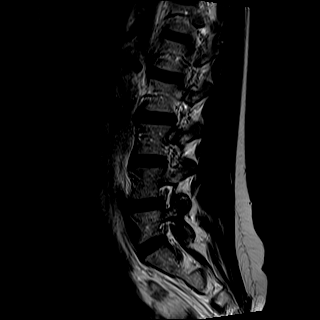
[im 7/14]
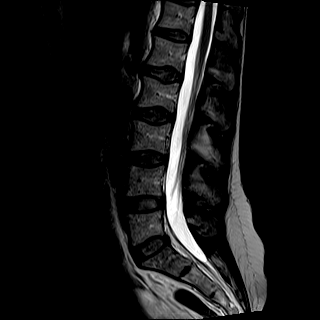
[im 10/14]
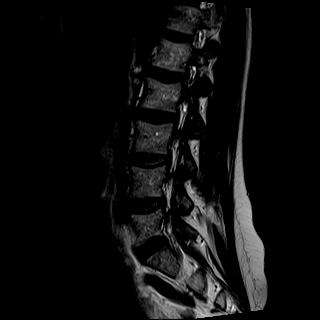
[im 14/14]
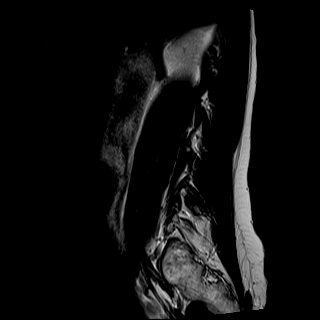

[Series 4: T1 · sagittal · 4.0mm · 0.81mm/px · 4 of 14 slices shown (1 of 3)]
[im 1/14]
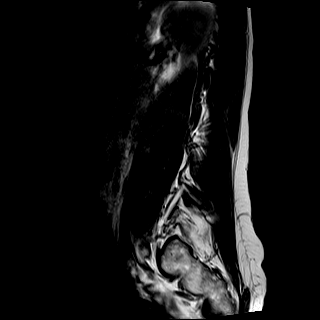
[im 5/14]
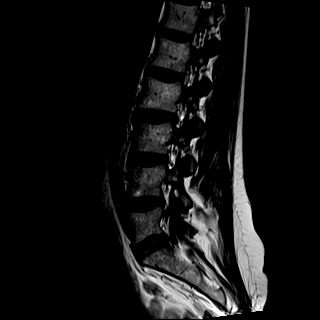
[im 9/14]
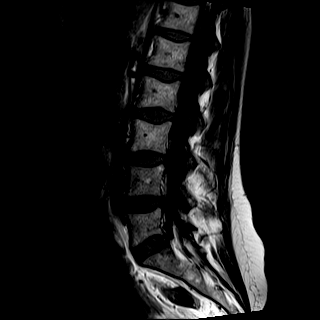
[im 14/14]
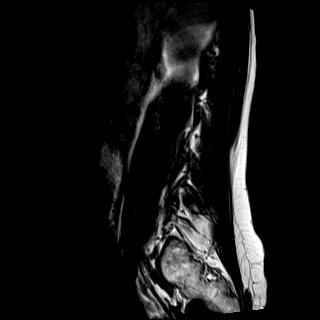

[Series 5: T2 · axial · 4.0mm · 0.39mm/px · z∈[-488,-280]mm · 8 of 34 slices shown (2 of 2)]
[im 1/34]
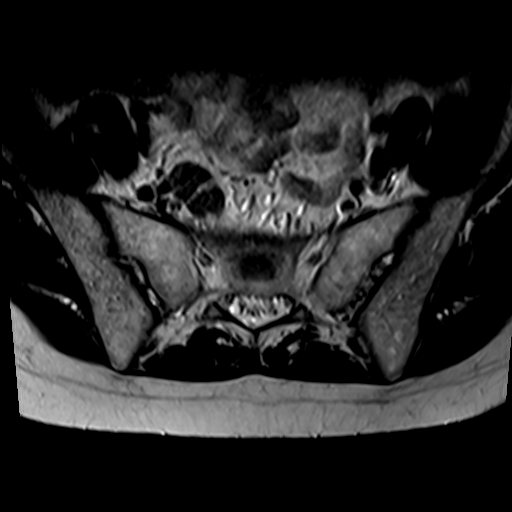
[im 4/34]
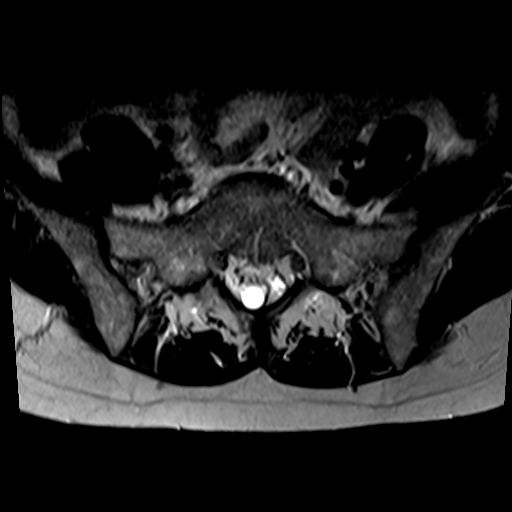
[im 12/34]
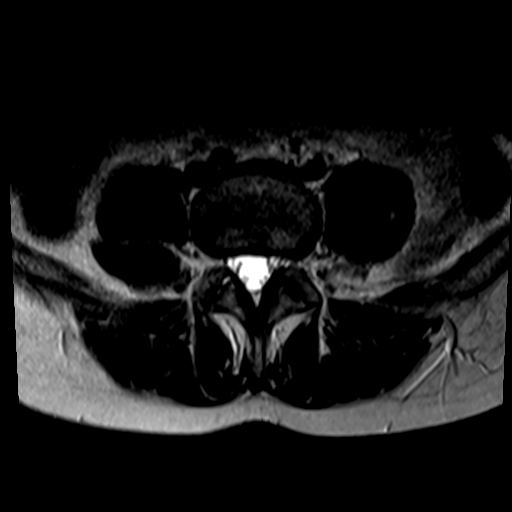
[im 15/34]
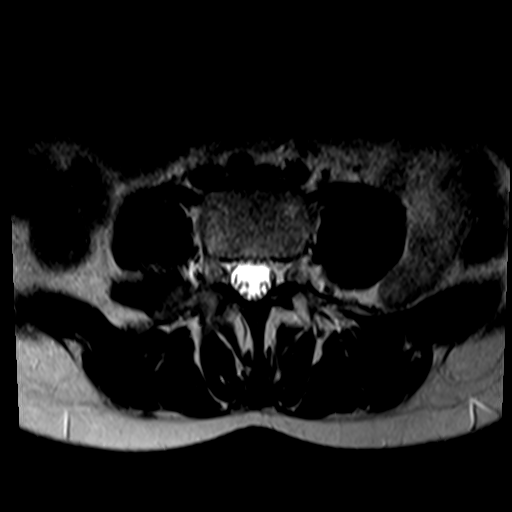
[im 19/34]
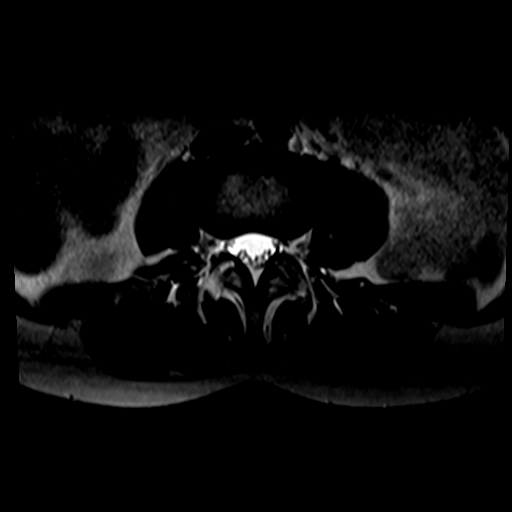
[im 23/34]
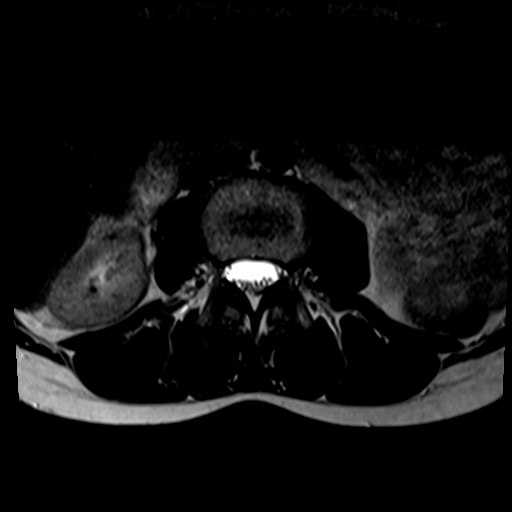
[im 30/34]
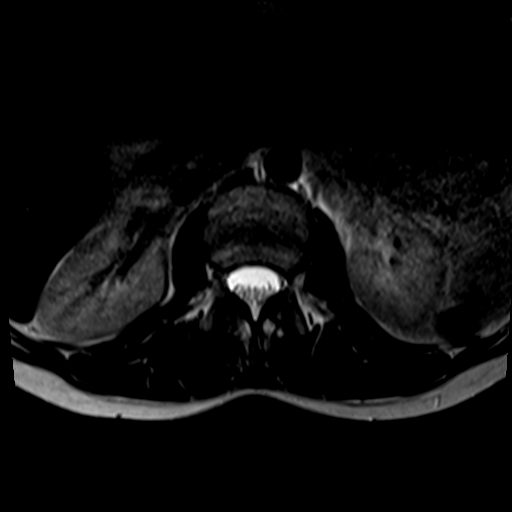
[im 34/34]
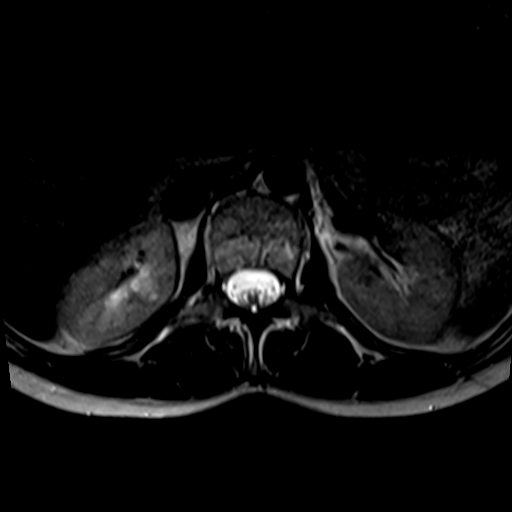

[Series 6: T1 · axial · 4.0mm · 0.39mm/px · z∈[-485,-282]mm · 8 of 34 slices shown (2 of 3)]
[im 1/34]
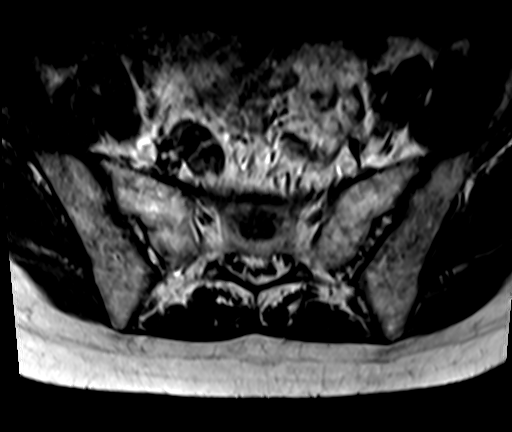
[im 4/34]
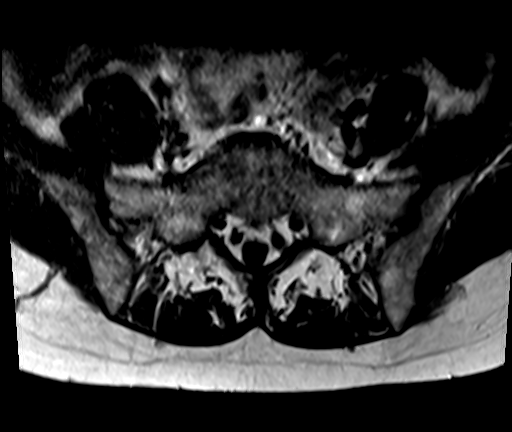
[im 12/34]
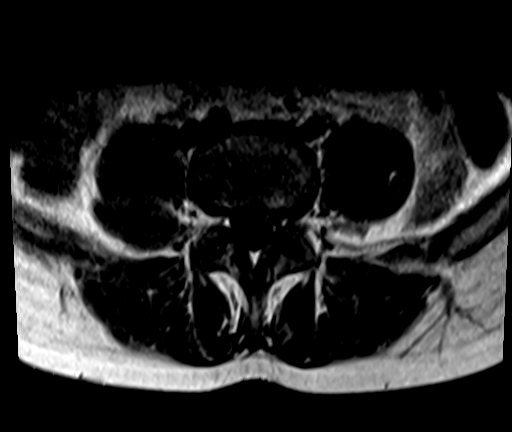
[im 15/34]
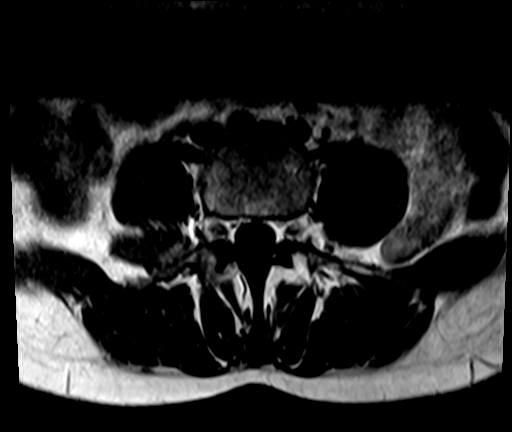
[im 19/34]
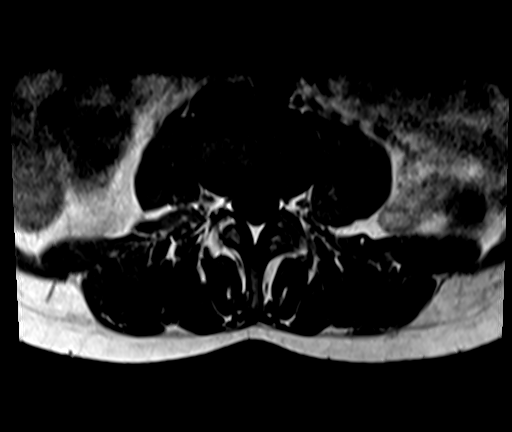
[im 23/34]
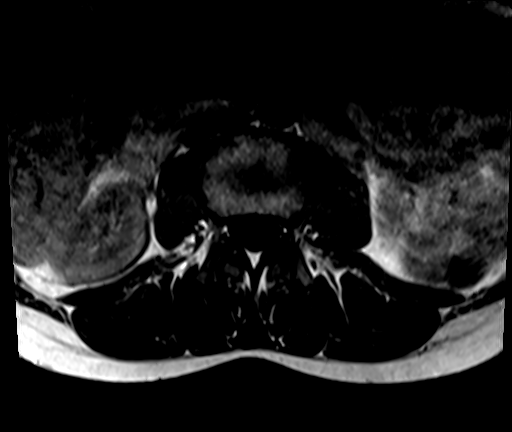
[im 30/34]
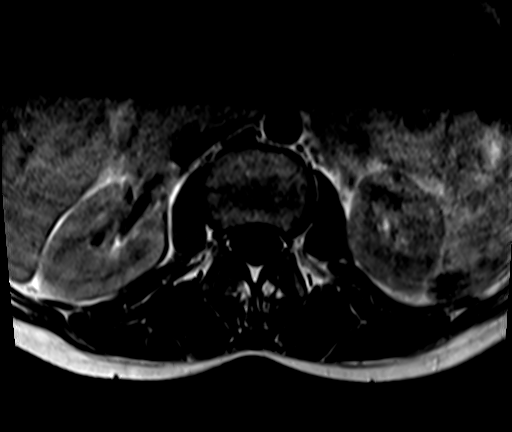
[im 34/34]
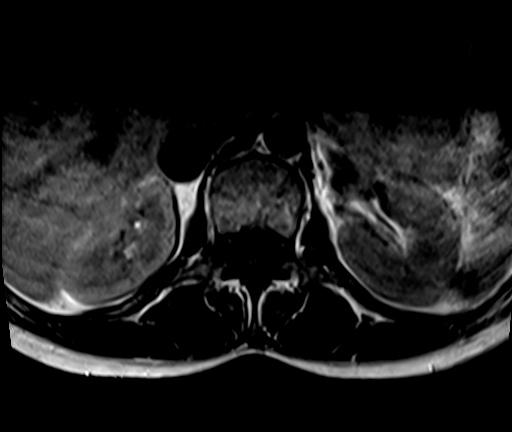

[Series 7: T1 · sagittal · 4.0mm · 1.02mm/px · 2 of 14 slices shown (3 of 3)]
[im 1/14]
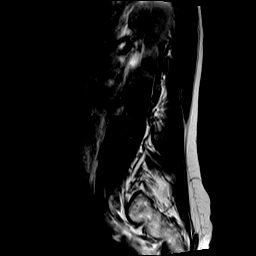
[im 5/14]
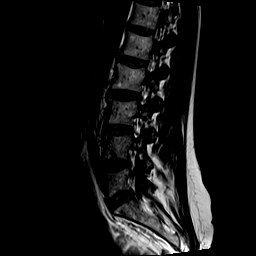

[27 of 48 positions shown; findings below may reference images not displayed]

FINDINGS: Segmentation: Standard. Lowest well-formed disc space labeled the
L5-S1 level.

Alignment: Physiologic with preservation of the normal lumbar
lordosis. No listhesis.

Vertebrae: Vertebral body height maintained without evidence for
acute or chronic fracture. Small chronic endplate Schmorl's node
deformity noted at the inferior endplate of L1. Bone marrow signal
intensity within normal limits. No discrete or worrisome osseous
lesions. No abnormal marrow edema.

Conus medullaris and cauda equina: Conus extends to the L1 level.
Conus and cauda equina appear normal.

Paraspinal and other soft tissues: Paraspinous soft tissues
demonstrate no acute finding. Visualized visceral structures within
normal limits.

Disc levels:

L1-2: Small chronic endplate Schmorl's node deformity at the
inferior endplate of L1. No disc bulge. No stenosis or impingement.

L2-3:  Unremarkable.

L3-4:  Unremarkable.

L4-5: Negative interspace. Mild facet hypertrophy. No canal or
foraminal stenosis.

L5-S1: Negative interspace. Mild facet hypertrophy. No canal or
foraminal stenosis.
IMPRESSION: 1. Mild bilateral facet hypertrophy at L4-5 and L5-S1 without
stenosis.
2. Otherwise unremarkable MRI of the lumbar spine. No significant
disc pathology. No stenosis or neural impingement.

## 2019-12-14 IMAGING — MR MR THORACIC SPINE W/O CM
5 of 12 series · 20 of 48 positions shown · non-contrast
Comparison: None available.

CLINICAL DATA: Initial evaluation for back pain with radiation into
the lower extremities, left-sided paresthesias.

EXAM:
MRI THORACIC SPINE WITHOUT CONTRAST
TECHNIQUE: Multiplanar, multisequence MR imaging of the thoracic spine was
performed. No intravenous contrast was administered.

[Series 4: T1 · sagittal · 3.0mm · 0.83mm/px · 3 of 10 slices shown (1 of 2)]
[im 1/10]
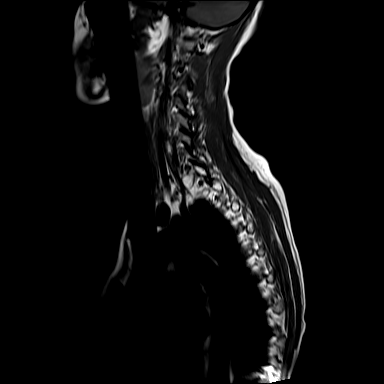
[im 5/10]
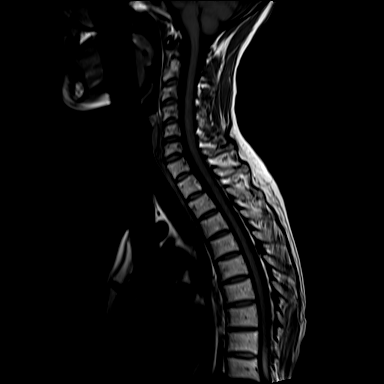
[im 10/10]
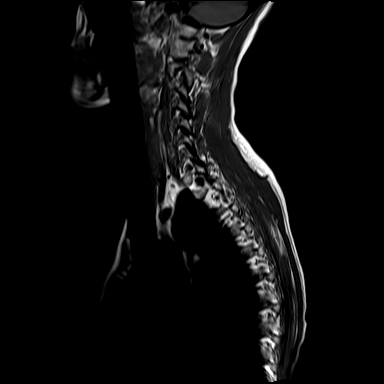

[Series 5: T2 · sagittal · 4.0mm · 0.83mm/px · 4 of 14 slices shown (1 of 3)]
[im 1/14]
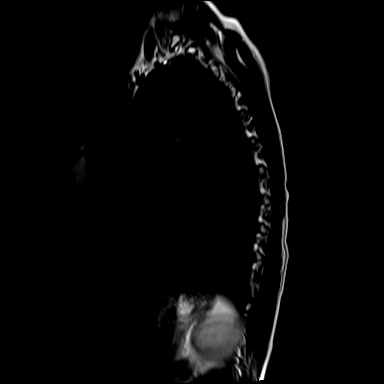
[im 5/14]
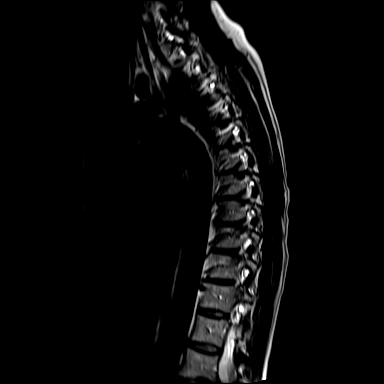
[im 9/14]
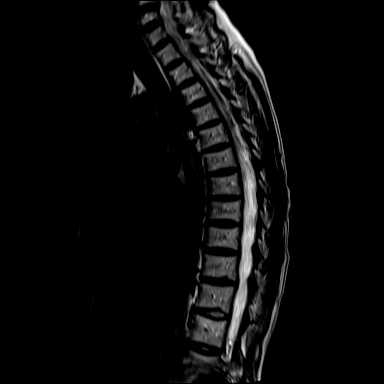
[im 14/14]
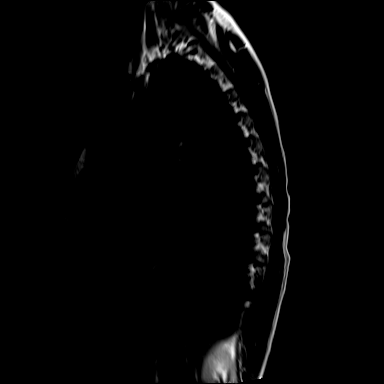

[Series 7: T1 · sagittal · 4.0mm · 0.50mm/px · 3 of 14 slices shown (2 of 2)]
[im 1/14]
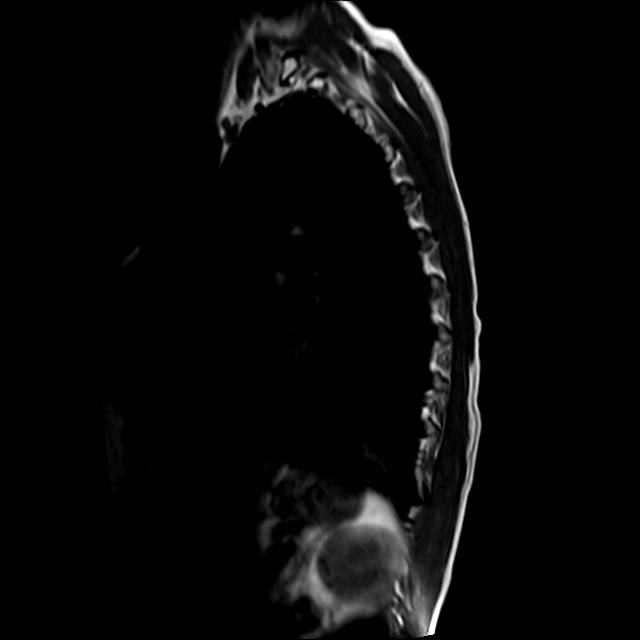
[im 5/14]
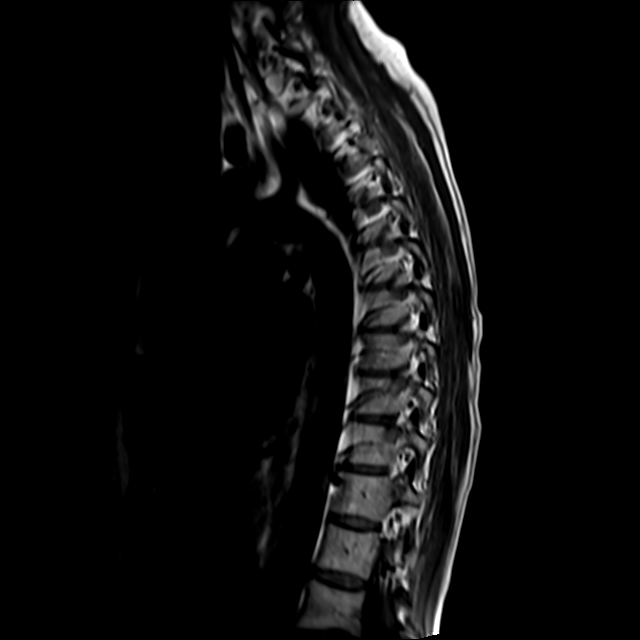
[im 9/14]
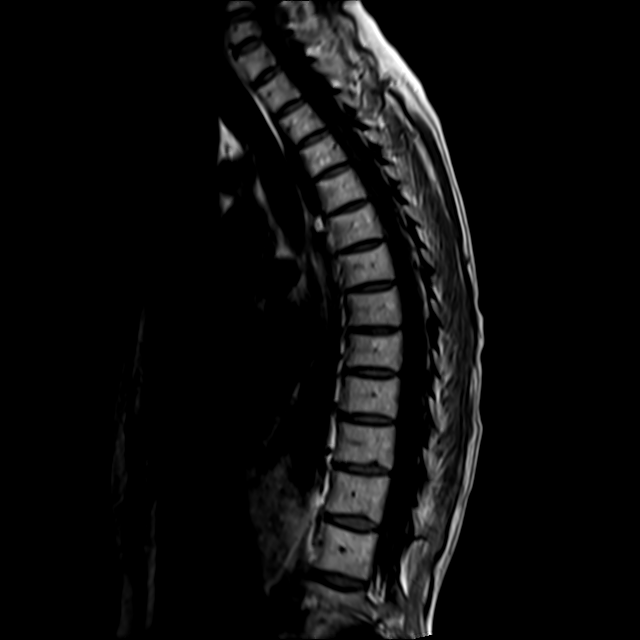

[Series 8: T2 · axial · 4.0mm · 0.39mm/px · z∈[-151,-62]mm · 5 of 21 slices shown (2 of 3)]
[im 1/21]
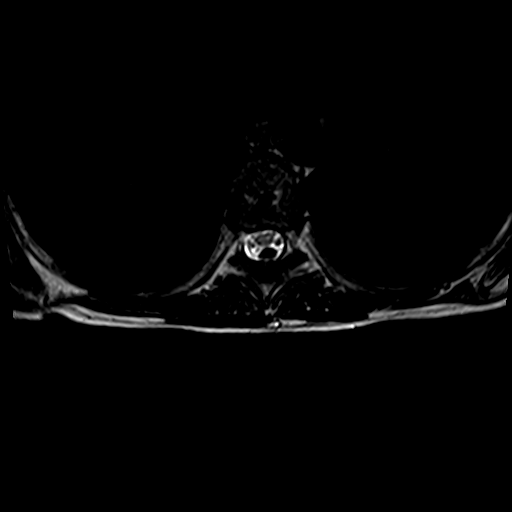
[im 6/21]
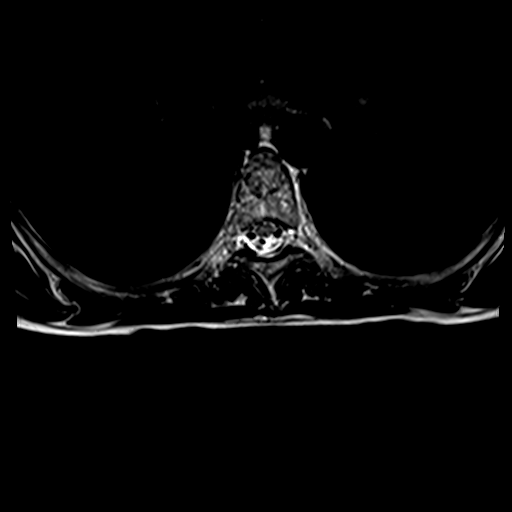
[im 11/21]
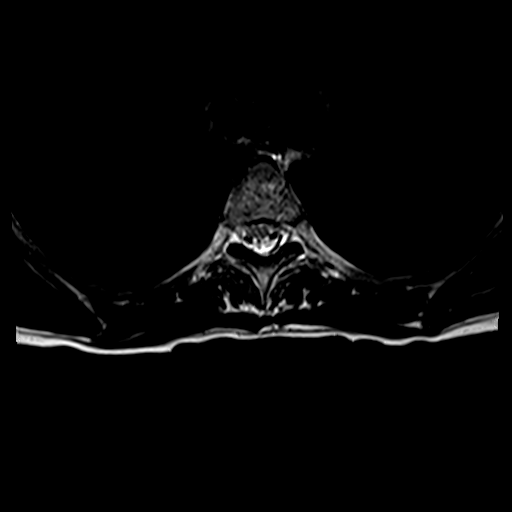
[im 16/21]
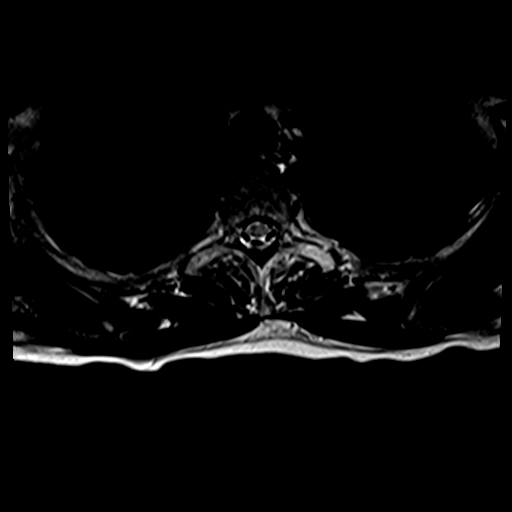
[im 21/21]
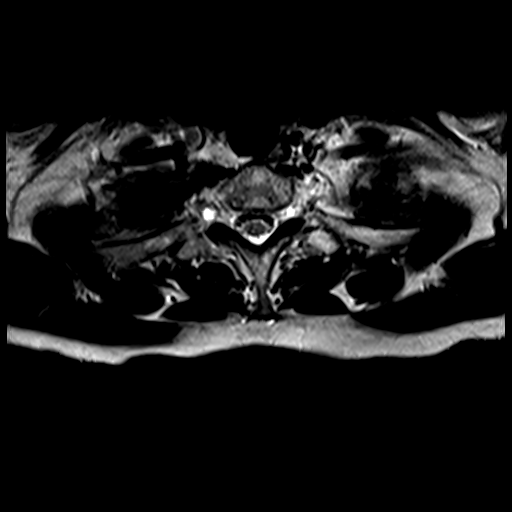

[Series 10: T2 · axial · 4.0mm · 0.39mm/px · z∈[-257,-145]mm · 5 of 18 slices shown (3 of 3)]
[im 1/18]
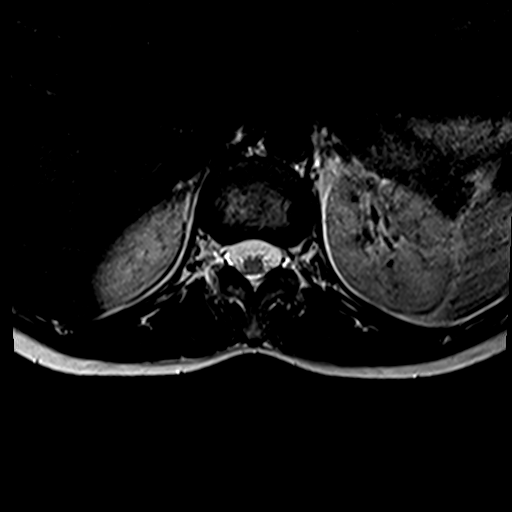
[im 5/18]
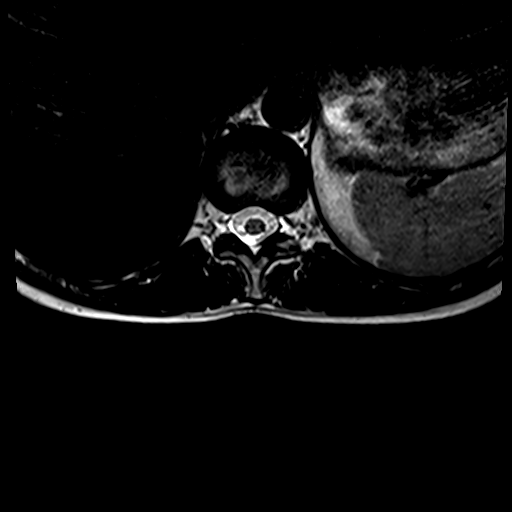
[im 9/18]
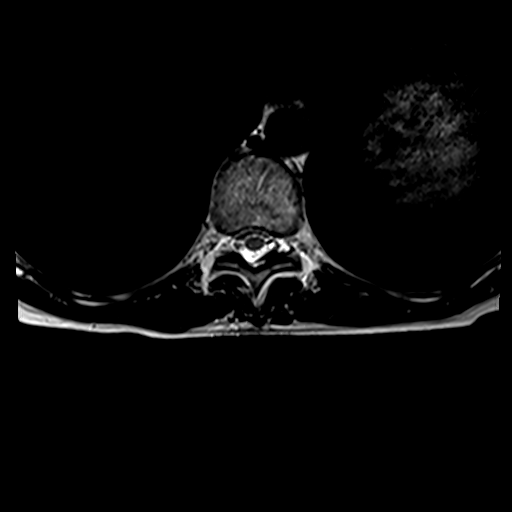
[im 13/18]
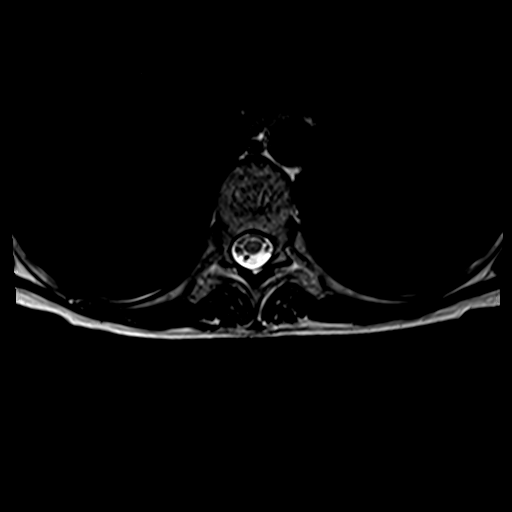
[im 18/18]
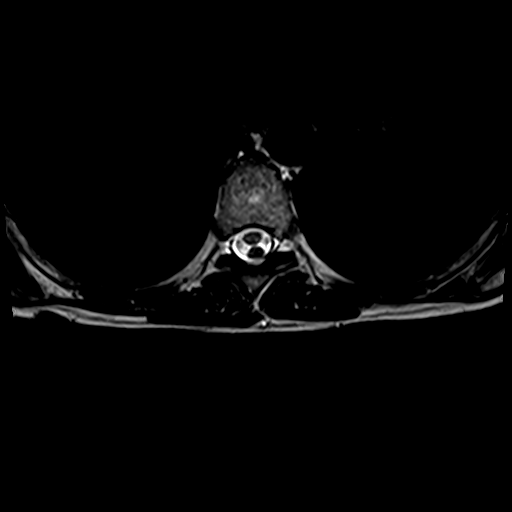

[20 of 48 positions shown; findings below may reference images not displayed]

FINDINGS: Alignment: Physiologic with preservation of the normal thoracic
kyphosis. No listhesis or subluxation.

Vertebrae: Vertebral body height maintained without evidence for
acute or chronic fracture. Small chronic endplate Schmorl's node
deformities noted about the T10-11 and T11-12 interspaces.
Underlying bone marrow signal intensity within normal limits. No
discrete or worrisome osseous lesions. No abnormal marrow edema.

Cord: Signal intensity within the thoracic spinal cord is normal.
Normal cord caliber morphology.

Paraspinal and other soft tissues: Unremarkable.

Disc levels:

T10-11: Minimal disc bulge. Associated chronic endplate Schmorl's
node deformities. No significant canal or foraminal stenosis.

T11-12: Small chronic endplate Schmorl's node deformities without
significant disc bulge. No canal or foraminal stenosis.

No other significant disc pathology seen within the thoracic spine.
No canal or foraminal stenosis. No neural impingement.
IMPRESSION: 1. Minimal degenerative disc disease at T10-11 and T11-12 without
significant stenosis.
2. Otherwise unremarkable and normal MRI of the thoracic spine. No
other significant disc pathology, stenosis, or evidence for neural
impingement.

## 2019-12-14 IMAGING — MR MR CERVICAL SPINE W/O CM
5 series · 36 of 48 positions shown · non-contrast
Comparison: None available.

CLINICAL DATA: Initial evaluation for left upper extremity pain and
paresthesias.

EXAM:
MRI CERVICAL SPINE WITHOUT CONTRAST
TECHNIQUE: Multiplanar, multisequence MR imaging of the cervical spine was
performed. No intravenous contrast was administered.

[Series 2: T2 · sagittal · 3.0mm · 0.69mm/px · 6 of 14 slices shown (1 of 3)]
[im 1/14]
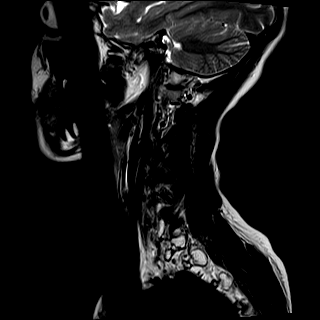
[im 3/14]
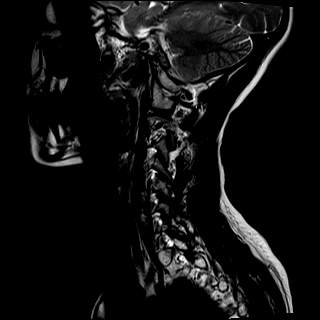
[im 6/14]
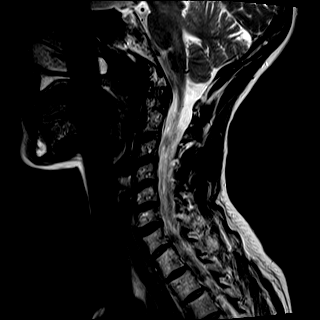
[im 8/14]
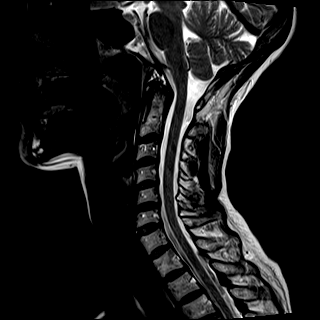
[im 11/14]
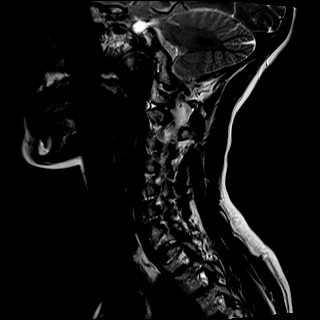
[im 14/14]
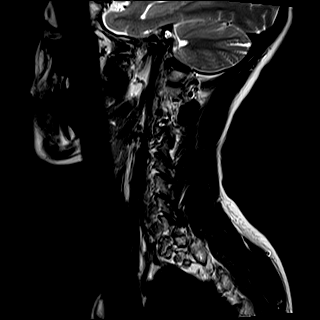

[Series 3: STIR · sagittal · 3.0mm · 0.69mm/px · 7 of 14 slices shown]
[im 1/14]
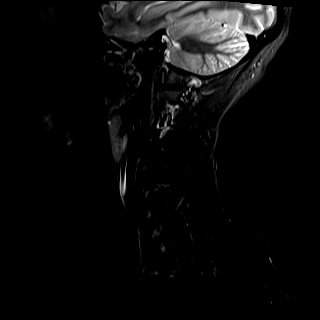
[im 3/14]
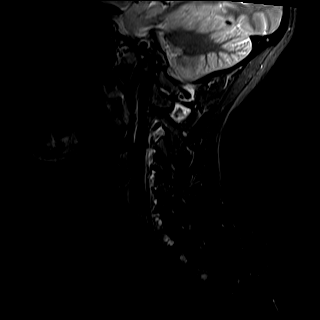
[im 5/14]
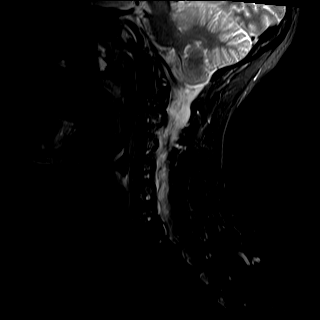
[im 7/14]
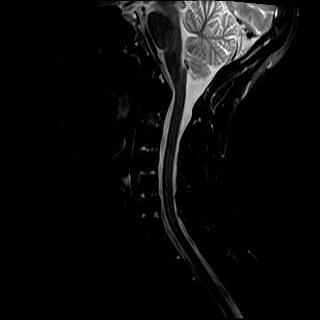
[im 9/14]
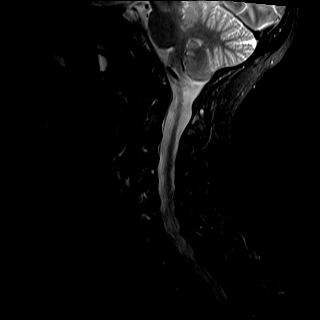
[im 11/14]
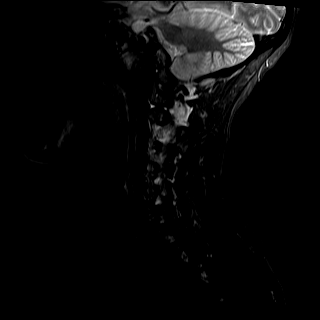
[im 14/14]
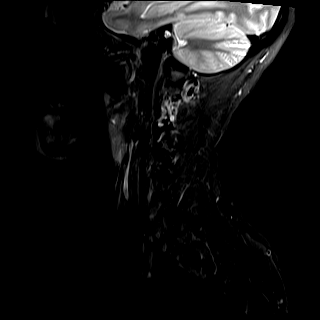

[Series 4: T1 · sagittal · 3.0mm · 0.69mm/px · 7 of 14 slices shown]
[im 1/14]
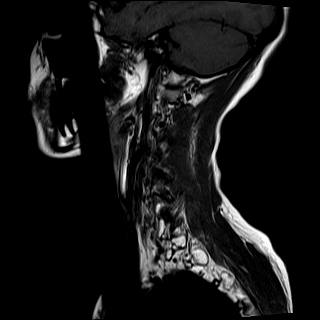
[im 3/14]
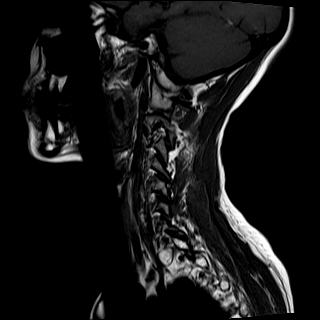
[im 5/14]
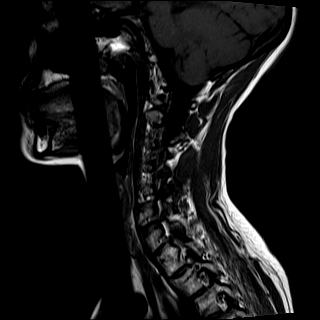
[im 7/14]
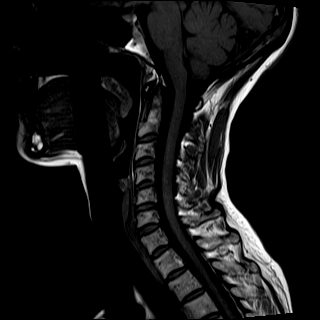
[im 9/14]
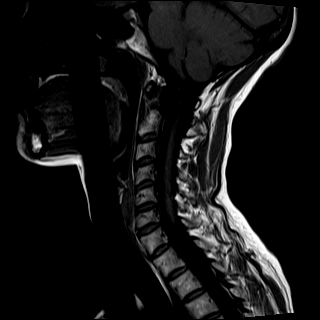
[im 11/14]
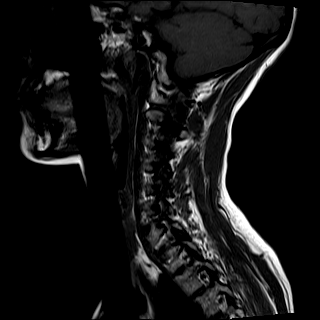
[im 14/14]
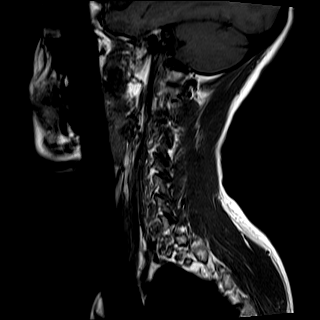

[Series 5: T2 · axial · 3.0mm · 0.62mm/px · z∈[-49,+51]mm · 8 of 30 slices shown (2 of 3)]
[im 1/30]
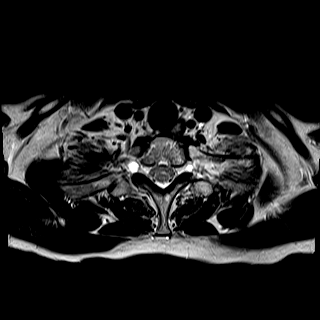
[im 5/30]
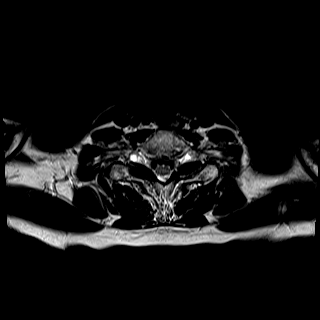
[im 9/30]
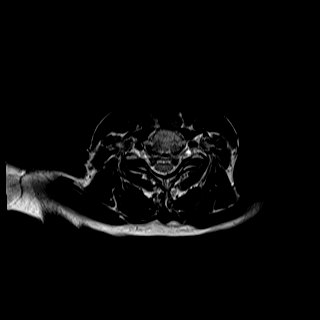
[im 14/30]
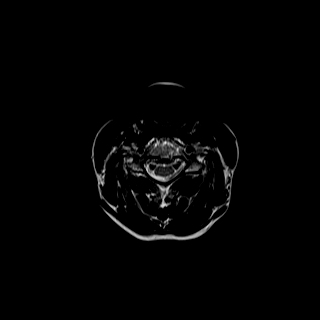
[im 16/30]
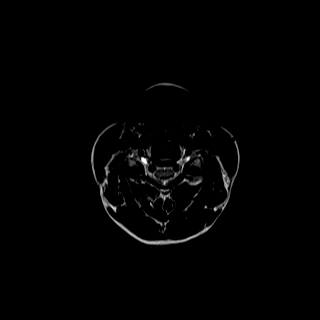
[im 21/30]
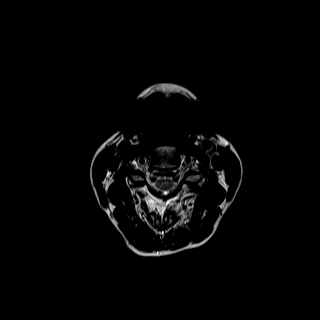
[im 25/30]
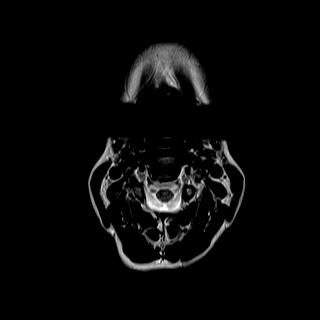
[im 30/30]
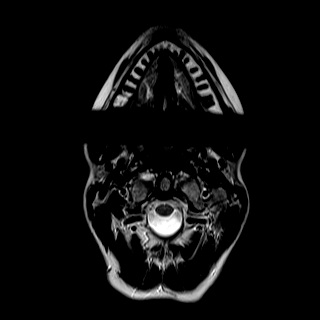

[Series 6: T2 · axial · 3.0mm · 0.39mm/px · z∈[-47,+54]mm · 8 of 30 slices shown (3 of 3)]
[im 1/30]
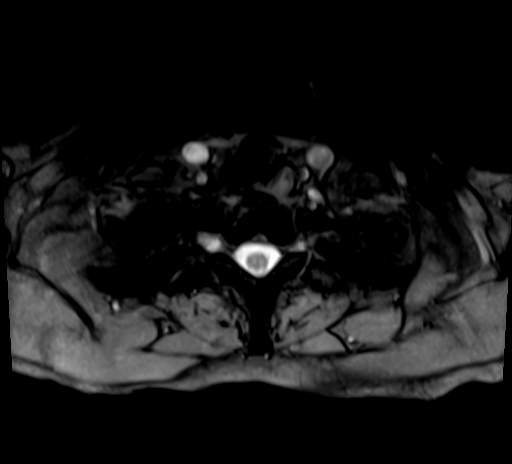
[im 5/30]
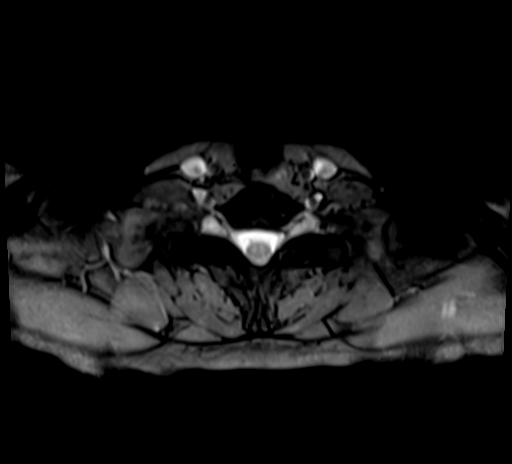
[im 9/30]
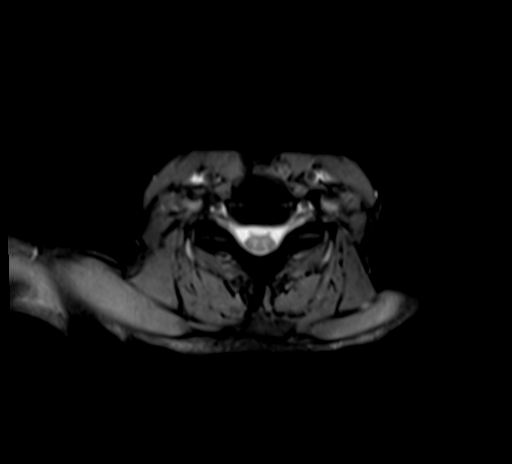
[im 14/30]
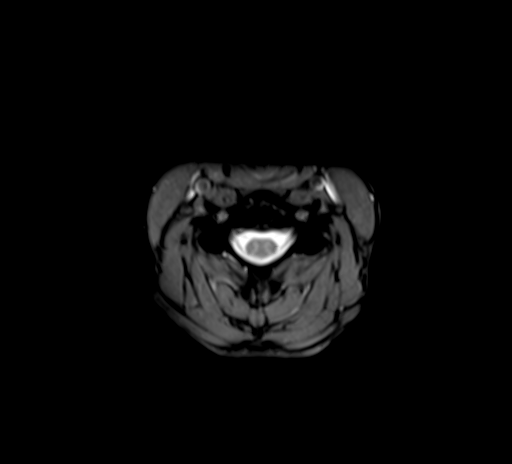
[im 16/30]
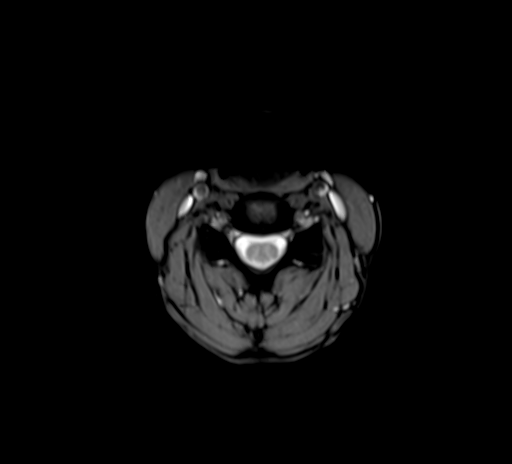
[im 21/30]
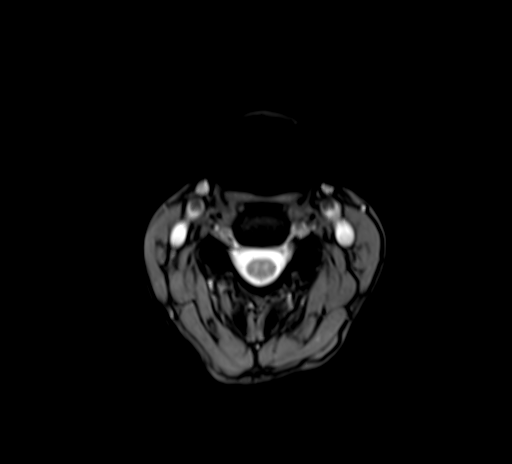
[im 25/30]
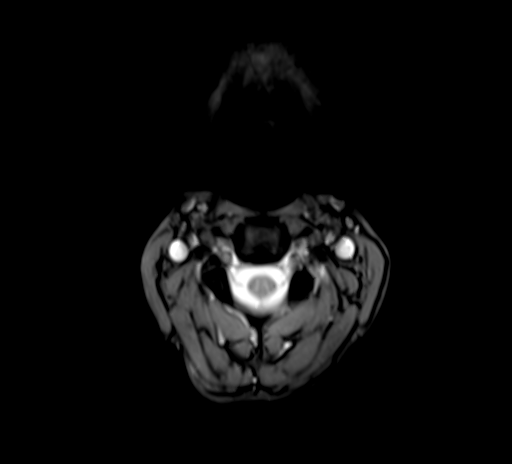
[im 30/30]
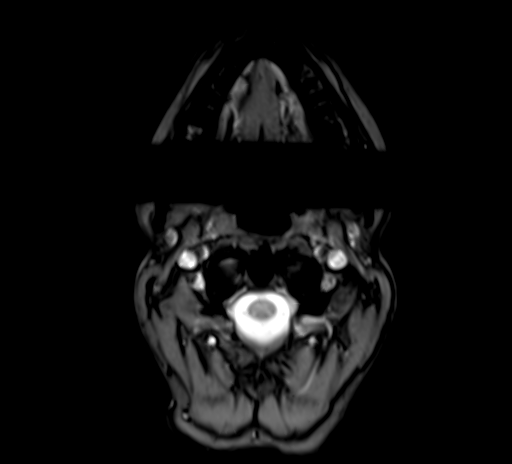

[36 of 48 positions shown; findings below may reference images not displayed]

FINDINGS: Alignment: Mild straightening of the normal cervical lordosis. No
listhesis or subluxation.

Vertebrae: Vertebral body height maintained without evidence for
acute or chronic fracture. Bone marrow signal intensity within
normal limits. No discrete or worrisome osseous lesions. No abnormal
marrow edema.

Cord: Signal intensity within the cervical spinal cord is normal.

Posterior Fossa, vertebral arteries, paraspinal tissues: Visualized
brain and posterior fossa within normal limits. Craniocervical
junction normal. Paraspinous and prevertebral soft tissues within
normal limits. Normal intravascular flow voids seen within the
vertebral arteries bilaterally.

Disc levels:

C2-C3: Unremarkable.

C3-C4:  Unremarkable.

C4-C5: Mild annular disc bulge. No significant spinal stenosis.
Foramina remain patent.

C5-C6: Mild annular disc bulge. No significant spinal stenosis.
Foramina remain patent.

C6-C7:  Mild annular disc bulge.  No canal or foraminal stenosis.

C7-T1:  Negative interspace.  Mild facet hypertrophy.  No stenosis.

Visualized upper thoracic spine demonstrates no significant finding.
IMPRESSION: 1. Mild noncompressive disc bulging at C4-5 through C6-7 without
stenosis or neural impingement.
2. Otherwise unremarkable MRI of the cervical spine.

## 2019-12-16 ENCOUNTER — Encounter: Payer: Self-pay | Admitting: Nurse Practitioner

## 2019-12-16 ENCOUNTER — Inpatient Hospital Stay: Payer: BC Managed Care – PPO | Attending: Hematology | Admitting: Nurse Practitioner

## 2019-12-16 DIAGNOSIS — G629 Polyneuropathy, unspecified: Secondary | ICD-10-CM | POA: Insufficient documentation

## 2019-12-16 DIAGNOSIS — Z79810 Long term (current) use of selective estrogen receptor modulators (SERMs): Secondary | ICD-10-CM | POA: Insufficient documentation

## 2019-12-16 DIAGNOSIS — Z801 Family history of malignant neoplasm of trachea, bronchus and lung: Secondary | ICD-10-CM | POA: Insufficient documentation

## 2019-12-16 DIAGNOSIS — Z806 Family history of leukemia: Secondary | ICD-10-CM | POA: Insufficient documentation

## 2019-12-16 DIAGNOSIS — D693 Immune thrombocytopenic purpura: Secondary | ICD-10-CM | POA: Insufficient documentation

## 2019-12-16 DIAGNOSIS — Z87891 Personal history of nicotine dependence: Secondary | ICD-10-CM | POA: Insufficient documentation

## 2019-12-16 DIAGNOSIS — M79609 Pain in unspecified limb: Secondary | ICD-10-CM

## 2019-12-16 DIAGNOSIS — Z807 Family history of other malignant neoplasms of lymphoid, hematopoietic and related tissues: Secondary | ICD-10-CM | POA: Insufficient documentation

## 2019-12-16 DIAGNOSIS — Z9013 Acquired absence of bilateral breasts and nipples: Secondary | ICD-10-CM | POA: Insufficient documentation

## 2019-12-16 DIAGNOSIS — Z79899 Other long term (current) drug therapy: Secondary | ICD-10-CM | POA: Insufficient documentation

## 2019-12-16 DIAGNOSIS — C50412 Malignant neoplasm of upper-outer quadrant of left female breast: Secondary | ICD-10-CM | POA: Insufficient documentation

## 2019-12-16 DIAGNOSIS — Z90722 Acquired absence of ovaries, bilateral: Secondary | ICD-10-CM | POA: Insufficient documentation

## 2019-12-16 DIAGNOSIS — R202 Paresthesia of skin: Secondary | ICD-10-CM

## 2019-12-16 DIAGNOSIS — Z8041 Family history of malignant neoplasm of ovary: Secondary | ICD-10-CM | POA: Insufficient documentation

## 2019-12-16 DIAGNOSIS — Z9221 Personal history of antineoplastic chemotherapy: Secondary | ICD-10-CM | POA: Insufficient documentation

## 2019-12-16 DIAGNOSIS — Z923 Personal history of irradiation: Secondary | ICD-10-CM | POA: Insufficient documentation

## 2019-12-16 DIAGNOSIS — Z803 Family history of malignant neoplasm of breast: Secondary | ICD-10-CM | POA: Insufficient documentation

## 2019-12-16 DIAGNOSIS — Z170 Estrogen receptor positive status [ER+]: Secondary | ICD-10-CM | POA: Insufficient documentation

## 2019-12-16 NOTE — Progress Notes (Signed)
I called Ms. Brisbane to review her MRI results, which shows mild noncompressive disc bulging in C spine and mild DDD in T10-12, overall no significant disc pathology, stenosis, neural impingement, osseous metastasis, or compression fracture. She is currently on medrol dose pack (for sinusitis, plus Zpack) which has helped sinus symptoms as well as her back pain and paresthesias, she denies neck pain. She continues to have jaw pain and headaches, her dentist told her she clenches her teeth which she attributes to stress. She continues chiropractics, massage therapy, and close f/u with ortho. She continues exercise and high level of physical activity. Symptoms are improving overall. No new concerns. She will continue tamoxifen. We will keep her previously scheduled visit on 04/27/20, she knows to call sooner if she has new or worsening concerns in the meantime.  Cira Rue, NP

## 2019-12-17 ENCOUNTER — Ambulatory Visit (HOSPITAL_COMMUNITY): Payer: BC Managed Care – PPO

## 2019-12-19 ENCOUNTER — Encounter: Payer: Self-pay | Admitting: Nurse Practitioner

## 2019-12-23 ENCOUNTER — Telehealth: Payer: Self-pay

## 2019-12-23 NOTE — Telephone Encounter (Signed)
Returned TC to patient in regard to her follow up visit question for Lacie about finishing her steroids yesterday. I let her know that Cira Rue NP recommended that she get her Steroid refilled by her ortho doctor.

## 2019-12-31 ENCOUNTER — Encounter: Payer: Self-pay | Admitting: Nurse Practitioner

## 2019-12-31 DIAGNOSIS — M25562 Pain in left knee: Secondary | ICD-10-CM | POA: Diagnosis not present

## 2020-01-01 ENCOUNTER — Encounter: Payer: Self-pay | Admitting: Hematology

## 2020-01-02 ENCOUNTER — Telehealth: Payer: Self-pay | Admitting: Nurse Practitioner

## 2020-01-02 NOTE — Telephone Encounter (Signed)
I called Ms. Cecena back to discuss her concerns related to persistent paresthesias affecting her face/jaw, back, right tricep, lower leg that is worse with movement. She went to ortho who she felt dismissed her concerns. This is beginning to negatively impact her mental state and quality of life.   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 ? 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 I have asked neuro oncologist Dr. Mickeal Skinner to review and possibly see her to evaluate.   She also notes a lower leg "knot" and "swollen tender vein" that is "poking out" which she noticed yesterday. Slightly tender with ambulation. She does not have risk factors for DVT except tamoxifen. Out of abundance of caution I have asked [facility] provider Lucianne Lei PA to see her tomorrow and order doppler if needed.   Lastly, she notes an enlarged axillary node now 4 weeks after last covid vaccine, she will have this evaluated when she comes in tomorrow to [facility].   Will f/u with her after the above work up/evaluation. She appreciates the call.   Cira Rue, NP

## 2020-01-03 ENCOUNTER — Inpatient Hospital Stay (HOSPITAL_BASED_OUTPATIENT_CLINIC_OR_DEPARTMENT_OTHER): Payer: BC Managed Care – PPO | Admitting: Medical

## 2020-01-03 ENCOUNTER — Other Ambulatory Visit: Payer: Self-pay

## 2020-01-03 VITALS — BP 109/87 | HR 81 | Temp 98.2°F | Resp 17 | Wt 121.3 lb

## 2020-01-03 DIAGNOSIS — C50412 Malignant neoplasm of upper-outer quadrant of left female breast: Secondary | ICD-10-CM

## 2020-01-03 DIAGNOSIS — Z79899 Other long term (current) drug therapy: Secondary | ICD-10-CM | POA: Diagnosis not present

## 2020-01-03 DIAGNOSIS — Z90722 Acquired absence of ovaries, bilateral: Secondary | ICD-10-CM | POA: Diagnosis not present

## 2020-01-03 DIAGNOSIS — Z170 Estrogen receptor positive status [ER+]: Secondary | ICD-10-CM | POA: Diagnosis not present

## 2020-01-03 DIAGNOSIS — Z9221 Personal history of antineoplastic chemotherapy: Secondary | ICD-10-CM | POA: Diagnosis not present

## 2020-01-03 DIAGNOSIS — Z9013 Acquired absence of bilateral breasts and nipples: Secondary | ICD-10-CM | POA: Diagnosis not present

## 2020-01-03 DIAGNOSIS — Z8041 Family history of malignant neoplasm of ovary: Secondary | ICD-10-CM | POA: Diagnosis not present

## 2020-01-03 DIAGNOSIS — Z807 Family history of other malignant neoplasms of lymphoid, hematopoietic and related tissues: Secondary | ICD-10-CM | POA: Diagnosis not present

## 2020-01-03 DIAGNOSIS — Z806 Family history of leukemia: Secondary | ICD-10-CM | POA: Diagnosis not present

## 2020-01-03 DIAGNOSIS — Z87891 Personal history of nicotine dependence: Secondary | ICD-10-CM | POA: Diagnosis not present

## 2020-01-03 DIAGNOSIS — Z803 Family history of malignant neoplasm of breast: Secondary | ICD-10-CM | POA: Diagnosis not present

## 2020-01-03 DIAGNOSIS — G629 Polyneuropathy, unspecified: Secondary | ICD-10-CM

## 2020-01-03 DIAGNOSIS — D693 Immune thrombocytopenic purpura: Secondary | ICD-10-CM | POA: Diagnosis not present

## 2020-01-03 DIAGNOSIS — Z801 Family history of malignant neoplasm of trachea, bronchus and lung: Secondary | ICD-10-CM | POA: Diagnosis not present

## 2020-01-03 DIAGNOSIS — Z79810 Long term (current) use of selective estrogen receptor modulators (SERMs): Secondary | ICD-10-CM | POA: Diagnosis not present

## 2020-01-03 DIAGNOSIS — Z923 Personal history of irradiation: Secondary | ICD-10-CM | POA: Diagnosis not present

## 2020-01-03 MED ORDER — GABAPENTIN 100 MG PO CAPS: 300.0000 mg | ORAL_CAPSULE | Freq: Every day | ORAL | 5 refills | Status: DC

## 2020-01-06 NOTE — Progress Notes (Signed)
[facility] Progress Note   Madison Savage [id number] October 27, 1970 49 y.o.  Madison Savage is managed by Dr. 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 nightly.  She will follow-up with Dr. Mickeal Skinner as scheduled for her neuropathy.  Malignant neoplasm of the left breast: The patient continues to be followed by Dr. Burr Medico and is currently treated with tamoxifen daily.  She is scheduled to return for follow-up on 04/27/2020.  Please see After Visit Summary for patient specific instructions.  Future Appointments  Date Time Provider [place]  04/10/2020 10:00 AM [facility] DX DEXA 1 [facility] GI-BREAST CE  04/27/2020  1:45 PM [facility] LAB 6 [facility] None  04/27/2020  2:20 PM [name], MD [facility] None    No orders of the defined types were placed in this encounter.      Subjective:   Patient ID:  Madison Savage is a 49 y.o. (DOB Dec 25, 1970) female.  Chief Complaint: No chief complaint on file.   HPI Madison Savage  is a 49 y.o. female with a diagnosis of malignant neoplasm of the left breast who is followed by Dr. Burr Medico.  She continues on tamoxifen.  She presents to the clinic today for evaluation of acute onset neuropathy of her upper and lower extremities.  She reports that she was okay until several weeks ago when she was at home and showed her daughter that she could do a back flip.  She reports that her lower back hurt acutely after that.  She denies any other acute issues concern.  Medications: I have reviewed the patient's current medications.  Allergies:  Allergies  Allergen Reactions  .  Clindamycin Anaphylaxis, Shortness Of Breath and Rash  . Contrast Media [Iodinated Diagnostic Agents] Shortness Of Breath  . Penicillins Nausea And Vomiting and Rash  . Codeine Itching    Past Medical History:  Diagnosis Date  . Anemia   . Anxiety   . BRCA2 positive   . Chronic ITP (idiopathic thrombocytopenia) (HCC)   . Family history of breast cancer   . Family history of ovarian cancer   . Headache    last migraine...9/24  . History of cancer chemotherapy    left breast 06-09-2017  to 11-16-2017  . History of cardiac murmur as a child    infant  . History of external beam radiation therapy    left breast   01-03-2018  to 02-19-2018  . MTHFR mutation ([facility])   . PONV (postoperative nausea and vomiting)   . 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 Wears contact lenses     Past Surgical History:  Procedure Laterality Date  . AXILLARY LYMPH NODE DISSECTION Left 04/27/2017   Procedure: AXILLARY LYMPH NODE DISSECTION;  Surgeon: [name], MD;  Location: [place];  Service: General;  Laterality: Left;  . BILATERAL TOTAL MASTECTOMY WITH AXILLARY LYMPH NODE DISSECTION  Bilateral 04/27/2017   Procedure: SKIN SPARING MASTECTOMY;  Surgeon: [name], MD;  Location: [place];  Service: General;  Laterality: Bilateral;  . BREAST CAPSULECTOMY WITH IMPLANT EXCHANGE Bilateral 12/06/2017   Procedure: BREAST CAPSULECTOMY WITH IMPLANT EXCHANGE;  Surgeon: Wallace Going, DO;  Location: [place];  Service: Plastics;  Laterality: Bilateral;  . BREAST RECONSTRUCTION WITH PLACEMENT OF TISSUE EXPANDER AND FLEX HD (ACELLULAR  HYDRATED DERMIS) Bilateral 04/27/2017   Procedure: BILATERAL BREAST RECONSTRUCTION WITH PLACEMENT OF TISSUE EXPANDER AND FLEX HD (ACELLULAR HYDRATED DERMIS);  Surgeon: Wallace Going, DO;  Location: [place];  Service: Plastics;  Laterality: Bilateral;  . PLACEMENT OF BREAST IMPLANTS Bilateral 12/06/2017   Procedure: PLACEMENT OF BREAST IMPLANTS;  Surgeon: Wallace Going, DO;  Location: [place];  Service: Plastics;  Laterality: Bilateral;  . PORT-A-CATH REMOVAL Right 12/06/2017   Procedure: REMOVAL PORT-A-CATH;  Surgeon: Wallace Going, DO;  Location: [place];  Service: Plastics;  Laterality: Right;  . PORTACATH PLACEMENT Right 06/01/2017   Procedure: INSERTION PORT-A-CATH RIGHT SUBCLAVIAN;  Surgeon: [name], MD;  Location: [place];  Service: General;  Laterality: Right;  . ROBOTIC ASSISTED SALPINGO OOPHERECTOMY Bilateral 08/31/2018   Procedure: XI ROBOTIC ASSISTED BILATERAL  SALPINGO OOPHORECTOMY;  Surgeon: Everitt Amber, MD;  Location: [facility];  Service: Gynecology;  Laterality: Bilateral;  . WISDOM TOOTH EXTRACTION      Family History  Problem Relation Age of Onset  . Breast cancer Paternal Aunt 30  . Ovarian cancer Paternal Aunt 49  . Cancer Paternal Grandmother        breast cancer   . Cancer Maternal Aunt        lymphoma   . Cancer Paternal Grandfather        throat cancer   . Cancer Paternal Aunt        lung cancer  . Cirrhosis Father   . AAA (abdominal aortic aneurysm) Maternal Grandfather   . Leukemia Paternal Aunt     Social History   Socioeconomic History  . Marital status: Married    Spouse name: Not on file  . Number of children: 3  . Years of education: Not on file  . Highest education level: Not on file  Occupational History    Employer: UNEMPLOYED  Tobacco Use  . Smoking status: Former Smoker    Packs/day: 0.50    Years: 20.00    Pack years: 10.00    Types: Cigarettes     Quit date: 03/05/2017    Years since quitting: 2.8  . Smokeless tobacco: Never Used  Substance and Sexual Activity  . Alcohol use: No  . Drug use: No  . Sexual activity: Yes    Partners: Male    Birth control/protection: None  Other Topics Concern  . Not on file  Social History Narrative   Stay at home mom   3 children ages 25, 27, 3   Social Determinants of Health   Financial Resource Strain:   . Difficulty of Paying Living Expenses:   Food Insecurity:   . Worried About Charity fundraiser in the Last Year:   . Arboriculturist in the Last Year:   Transportation Needs:   . Film/video editor (Medical):   Marland Kitchen Lack of Transportation (Non-Medical):   Physical Activity:   . Days of Exercise per Week:   . Minutes of Exercise per Session:   Stress:   . Feeling  of Stress :   Social Connections:   . Frequency of Communication with Friends and Family:   . Frequency of Social Gatherings with Friends and Family:   . Attends Religious Services:   . Active Member of Clubs or Organizations:   . Attends Archivist Meetings:   Marland Kitchen Marital Status:   Intimate Partner Violence:   . Fear of Current or Ex-Partner:   . Emotionally Abused:   Marland Kitchen Physically Abused:   . Sexually Abused:     Past Medical History, Surgical history, Social history, and Family history were reviewed and updated as appropriate.   Please see review of systems for further details on the patient's review from today.   Review of Systems:  Review of Systems  Constitutional: Negative for chills, diaphoresis and fever.  HENT: Negative for trouble swallowing and voice change.   Respiratory: Negative for cough, chest tightness, shortness of breath and wheezing.   Cardiovascular: Negative for chest pain and palpitations.  Gastrointestinal: Negative for abdominal pain, constipation, diarrhea, nausea and vomiting.  Musculoskeletal: Negative for back pain and myalgias.  Neurological: Positive for numbness. Negative  for dizziness, tremors, weakness, light-headedness and headaches.    Objective:   Physical Exam:  BP 109/87 (BP Location: Left Arm, Patient Position: Sitting)   Pulse 81   Temp 98.2 F (36.8 C) (Temporal)   Resp 17   Wt 121 lb 4.8 oz (55 kg)   SpO2 98%   BMI 18.44 kg/m  ECOG: 0  Physical Exam Constitutional:      General: She is not in acute distress.    Appearance: She is not diaphoretic.  HENT:     Head: Normocephalic and atraumatic.  Eyes:     General: No scleral icterus.       Right eye: No discharge.        Left eye: No discharge.     Conjunctiva/sclera: Conjunctivae normal.  Cardiovascular:     Rate and Rhythm: Normal rate and regular rhythm.     Heart sounds: Normal heart sounds. No murmur. No friction rub. No gallop.   Pulmonary:     Effort: Pulmonary effort is normal. No respiratory distress.     Breath sounds: Normal breath sounds. No wheezing or rales.  Musculoskeletal:     Right lower leg: No edema.     Left lower leg: No edema.     Comments: Calves are bilaterally equal at 32 cm at 12 cm distal to the inferior pole of the patella.  Skin:    General: Skin is warm and dry.     Findings: No erythema or rash.  Neurological:     Mental Status: She is alert.     Coordination: Coordination normal.     Gait: Gait normal.  Psychiatric:        Mood and Affect: Mood normal.        Behavior: Behavior normal.        Thought Content: Thought content normal.        Judgment: Judgment normal.     Lab Review:     Component Value Date/Time   NA 142 11/28/2019 1051   NA 139 09/08/2017 1024   K 4.3 11/28/2019 1051   K 4.0 09/08/2017 1024   CL 107 11/28/2019 1051   CO2 27 11/28/2019 1051   CO2 26 09/08/2017 1024   GLUCOSE 98 11/28/2019 1051   GLUCOSE 99 09/08/2017 1024   BUN 15 11/28/2019 1051   BUN 13.6 09/08/2017 1024  CREATININE 0.96 11/28/2019 1051   CREATININE 0.8 09/08/2017 1024   CALCIUM 9.2 11/28/2019 1051   CALCIUM 9.1 09/08/2017 1024   PROT  7.1 11/28/2019 1051   PROT 6.3 (L) 09/08/2017 1024   ALBUMIN 4.4 11/28/2019 1051   ALBUMIN 3.9 09/08/2017 1024   AST 16 11/28/2019 1051   AST 14 09/08/2017 1024   ALT 14 11/28/2019 1051   ALT 14 09/08/2017 1024   ALKPHOS 61 11/28/2019 1051   ALKPHOS 75 09/08/2017 1024   BILITOT 0.3 11/28/2019 1051   BILITOT 0.30 09/08/2017 1024   GFRNONAA >60 11/28/2019 1051   GFRAA >60 11/28/2019 1051       Component Value Date/Time   WBC 5.7 11/28/2019 1051   WBC 6.0 10/28/2019 1358   RBC 4.86 11/28/2019 1051   HGB 14.6 11/28/2019 1051   HGB 13.0 09/08/2017 1024   HCT 44.0 11/28/2019 1051   HCT 38.8 09/08/2017 1024   PLT 195 11/28/2019 1051   PLT 135 (L) 09/08/2017 1024   MCV 90.5 11/28/2019 1051   MCV 92.4 09/08/2017 1024   MCH 30.0 11/28/2019 1051   MCHC 33.2 11/28/2019 1051   RDW 12.9 11/28/2019 1051   RDW 16.4 (H) 09/08/2017 1024   LYMPHSABS 1.5 11/28/2019 1051   LYMPHSABS 0.9 09/08/2017 1024   MONOABS 0.5 11/28/2019 1051   MONOABS 0.5 09/08/2017 1024   EOSABS 0.1 11/28/2019 1051   EOSABS 0.1 09/08/2017 1024   BASOSABS 0.0 11/28/2019 1051   BASOSABS 0.0 09/08/2017 1024   -------------------------------  Imaging from last 24 hours (if applicable):  Radiology interpretation: MR Cervical Spine Wo Contrast  Result Date: 12/15/2019 CLINICAL DATA:  Initial evaluation for left upper extremity pain and paresthesias. EXAM: MRI CERVICAL SPINE WITHOUT CONTRAST TECHNIQUE: Multiplanar, multisequence MR imaging of the cervical spine was performed. No intravenous contrast was administered. COMPARISON:  None available. FINDINGS: Alignment: Mild straightening of the normal cervical lordosis. No listhesis or subluxation. Vertebrae: Vertebral body height maintained without evidence for acute or chronic fracture. Bone marrow signal intensity within normal limits. No discrete or worrisome osseous lesions. No abnormal marrow edema. Cord: Signal intensity within the cervical spinal cord is normal.  Posterior Fossa, vertebral arteries, paraspinal tissues: Visualized brain and posterior fossa within normal limits. Craniocervical junction normal. Paraspinous and prevertebral soft tissues within normal limits. Normal intravascular flow voids seen within the vertebral arteries bilaterally. Disc levels: C2-C3: Unremarkable. C3-C4:  Unremarkable. C4-C5: Mild annular disc bulge. No significant spinal stenosis. Foramina remain patent. C5-C6: Mild annular disc bulge. No significant spinal stenosis. Foramina remain patent. C6-C7:  Mild annular disc bulge.  No canal or foraminal stenosis. C7-T1:  Negative interspace.  Mild facet hypertrophy.  No stenosis. Visualized upper thoracic spine demonstrates no significant finding. IMPRESSION: 1. Mild noncompressive disc bulging at C4-5 through C6-7 without stenosis or neural impingement. 2. Otherwise unremarkable MRI of the cervical spine. Electronically Signed   By: Jeannine Boga M.D.   On: 12/15/2019 01:42   MR Thoracic Spine Wo Contrast  Result Date: 12/15/2019 CLINICAL DATA:  Initial evaluation for back pain with radiation into the lower extremities, left-sided paresthesias. EXAM: MRI THORACIC SPINE WITHOUT CONTRAST TECHNIQUE: Multiplanar, multisequence MR imaging of the thoracic spine was performed. No intravenous contrast was administered. COMPARISON:  None available. FINDINGS: Alignment: Physiologic with preservation of the normal thoracic kyphosis. No listhesis or subluxation. Vertebrae: Vertebral body height maintained without evidence for acute or chronic fracture. Small chronic endplate Schmorl's node deformities noted about the T10-11 and T11-12 interspaces. Underlying bone marrow  signal intensity within normal limits. No discrete or worrisome osseous lesions. No abnormal marrow edema. Cord: Signal intensity within the thoracic spinal cord is normal. Normal cord caliber morphology. Paraspinal and other soft tissues: Unremarkable. Disc levels: T10-11:  Minimal disc bulge. Associated chronic endplate Schmorl's node deformities. No significant canal or foraminal stenosis. T11-12: Small chronic endplate Schmorl's node deformities without significant disc bulge. No canal or foraminal stenosis. No other significant disc pathology seen within the thoracic spine. No canal or foraminal stenosis. No neural impingement. IMPRESSION: 1. Minimal degenerative disc disease at T10-11 and T11-12 without significant stenosis. 2. Otherwise unremarkable and normal MRI of the thoracic spine. No other significant disc pathology, stenosis, or evidence for neural impingement. Electronically Signed   By: Jeannine Boga M.D.   On: 12/15/2019 03:56   MR Lumbar Spine Wo Contrast  Result Date: 12/15/2019 CLINICAL DATA:  Initial evaluation for back pain with radiation into the bilateral lower extremities for 5 weeks. EXAM: MRI LUMBAR SPINE WITHOUT CONTRAST TECHNIQUE: Multiplanar, multisequence MR imaging of the lumbar spine was performed. No intravenous contrast was administered. COMPARISON:  None available. FINDINGS: Segmentation: Standard. Lowest well-formed disc space labeled the L5-S1 level. Alignment: Physiologic with preservation of the normal lumbar lordosis. No listhesis. Vertebrae: Vertebral body height maintained without evidence for acute or chronic fracture. Small chronic endplate Schmorl's node deformity noted at the inferior endplate of L1. Bone marrow signal intensity within normal limits. No discrete or worrisome osseous lesions. No abnormal marrow edema. Conus medullaris and cauda equina: Conus extends to the L1 level. Conus and cauda equina appear normal. Paraspinal and other soft tissues: Paraspinous soft tissues demonstrate no acute finding. Visualized visceral structures within normal limits. Disc levels: L1-2: Small chronic endplate Schmorl's node deformity at the inferior endplate of L1. No disc bulge. No stenosis or impingement. L2-3:  Unremarkable. L3-4:   Unremarkable. L4-5: Negative interspace. Mild facet hypertrophy. No canal or foraminal stenosis. L5-S1: Negative interspace. Mild facet hypertrophy. No canal or foraminal stenosis. IMPRESSION: 1. Mild bilateral facet hypertrophy at L4-5 and L5-S1 without stenosis. 2. Otherwise unremarkable MRI of the lumbar spine. No significant disc pathology. No stenosis or neural impingement. Electronically Signed   By: Jeannine Boga M.D.   On: 12/15/2019 03:45

## 2020-01-07 ENCOUNTER — Encounter: Payer: Self-pay | Admitting: Nurse Practitioner

## 2020-01-08 ENCOUNTER — Telehealth: Payer: Self-pay | Admitting: Internal Medicine

## 2020-01-08 NOTE — Telephone Encounter (Signed)
I received a scheduling msg to make an appt for Ms. Holtsclaw to see Dr. Mickeal Skinner. I attempted to call the pt but wasn't able to reach her. I lft a voicemail for Ms. Stouffer to call me back.

## 2020-01-09 ENCOUNTER — Encounter: Payer: Self-pay | Admitting: Nurse Practitioner

## 2020-01-09 ENCOUNTER — Telehealth: Payer: Self-pay | Admitting: Internal Medicine

## 2020-01-09 NOTE — Telephone Encounter (Signed)
I received a staff msg to schedule an appt for [name] to see Dr. Mickeal Skinner. A new pt appt has been scheduled with Dr. Mickeal Skinner on 5/10 at 11am. I sent a mychart msg to the pt and asked that she needs to reschedule to call me. I provided my direct phone number.

## 2020-01-13 ENCOUNTER — Inpatient Hospital Stay: Payer: BC Managed Care – PPO | Attending: Hematology | Admitting: Internal Medicine

## 2020-01-13 ENCOUNTER — Other Ambulatory Visit: Payer: Self-pay

## 2020-01-13 DIAGNOSIS — Z90722 Acquired absence of ovaries, bilateral: Secondary | ICD-10-CM | POA: Diagnosis not present

## 2020-01-13 DIAGNOSIS — G620 Drug-induced polyneuropathy: Secondary | ICD-10-CM | POA: Insufficient documentation

## 2020-01-13 DIAGNOSIS — T451X5A Adverse effect of antineoplastic and immunosuppressive drugs, initial encounter: Secondary | ICD-10-CM

## 2020-01-13 DIAGNOSIS — Z8041 Family history of malignant neoplasm of ovary: Secondary | ICD-10-CM | POA: Diagnosis not present

## 2020-01-13 DIAGNOSIS — Z803 Family history of malignant neoplasm of breast: Secondary | ICD-10-CM | POA: Insufficient documentation

## 2020-01-13 DIAGNOSIS — Z7952 Long term (current) use of systemic steroids: Secondary | ICD-10-CM | POA: Diagnosis not present

## 2020-01-13 DIAGNOSIS — Z923 Personal history of irradiation: Secondary | ICD-10-CM | POA: Diagnosis not present

## 2020-01-13 DIAGNOSIS — Z87891 Personal history of nicotine dependence: Secondary | ICD-10-CM | POA: Insufficient documentation

## 2020-01-13 DIAGNOSIS — Z9221 Personal history of antineoplastic chemotherapy: Secondary | ICD-10-CM | POA: Diagnosis not present

## 2020-01-13 DIAGNOSIS — Z9013 Acquired absence of bilateral breasts and nipples: Secondary | ICD-10-CM | POA: Diagnosis not present

## 2020-01-13 DIAGNOSIS — Z170 Estrogen receptor positive status [ER+]: Secondary | ICD-10-CM | POA: Diagnosis not present

## 2020-01-13 DIAGNOSIS — F419 Anxiety disorder, unspecified: Secondary | ICD-10-CM | POA: Insufficient documentation

## 2020-01-13 DIAGNOSIS — Z807 Family history of other malignant neoplasms of lymphoid, hematopoietic and related tissues: Secondary | ICD-10-CM | POA: Diagnosis not present

## 2020-01-13 DIAGNOSIS — Z791 Long term (current) use of non-steroidal anti-inflammatories (NSAID): Secondary | ICD-10-CM | POA: Diagnosis not present

## 2020-01-13 DIAGNOSIS — Z9079 Acquired absence of other genital organ(s): Secondary | ICD-10-CM | POA: Diagnosis not present

## 2020-01-13 DIAGNOSIS — Z801 Family history of malignant neoplasm of trachea, bronchus and lung: Secondary | ICD-10-CM | POA: Diagnosis not present

## 2020-01-13 DIAGNOSIS — Z806 Family history of leukemia: Secondary | ICD-10-CM | POA: Insufficient documentation

## 2020-01-13 DIAGNOSIS — C773 Secondary and unspecified malignant neoplasm of axilla and upper limb lymph nodes: Secondary | ICD-10-CM | POA: Insufficient documentation

## 2020-01-13 DIAGNOSIS — Z79899 Other long term (current) drug therapy: Secondary | ICD-10-CM | POA: Insufficient documentation

## 2020-01-13 DIAGNOSIS — D693 Immune thrombocytopenic purpura: Secondary | ICD-10-CM | POA: Insufficient documentation

## 2020-01-13 DIAGNOSIS — C50412 Malignant neoplasm of upper-outer quadrant of left female breast: Secondary | ICD-10-CM | POA: Diagnosis not present

## 2020-01-13 NOTE — Progress Notes (Signed)
[facility] [place], [place] [address] [phone]   New Patient Evaluation  Date of Service: 01/13/20 Patient Name: Madison Savage Patient MRN: [id number] Patient DOB: 10-04-1970 Provider: Ventura Sellers, MD  Identifying Statement:  Madison Savage is a 49 y.o. female with sensory impairment who presents for initial consultation and evaluation regarding cancer associated neurologic deficits.    Primary Cancer:  Oncologic History: Oncology History Overview Note  Cancer Staging Breast cancer metastasized to axillary lymph node, left ([place]) Staging form: Breast, AJCC 8th Edition - Clinical stage from 03/22/2017: Stage Unknown (cTX, cN1, cM0, GX, ER: Positive, PR: Positive, HER2: Negative) - Signed by [name], MD on 03/29/2017 - Pathologic stage from 04/27/2017: Stage IB (pT1c, pN2a, cM0, G2, ER: Positive, PR: Positive, HER2: Negative) - Signed by [name], MD on 05/25/2017 \   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 ([place])  03/21/2017 Mammogram   Diagnostic mammogram bilateral 03/21/17 IMPRESSION: INCOMPLETE - ADDITIONAL IMAGING EVALUATION NEEDED 1. The 0.6 cm oval mass in the right breast at 11 o'clock middle depth is indeterminate. An ultrasound is recommended.  2. The 0.5cm round focal asymmetry in the left breast central to the nipple middle depth is indeterminate. An ultrasound is recommended.  3. Ultrasound of the palpable abnormalities in the axilla and far left lateral breast is recommended.  4. Multiple clusters of pleomorphic calcifications in the left upper outer breast anterior depth spanning a 2.5 cm area are highly suggestive of malignancy. A stereotactic biopsy is recommended.     03/21/2017 Imaging   US Breast bilateral 03/21/17 IMPRESSION: HIGHLY SUGGESTIVE OF MALIGNANCY 1. Six distinct abnormal left axillary nodes, 2 of which correspond to the palpable lumps felt by the patient.  Findings concerning for metastatic adenopathy. Ultrasound guided biopsy of one of these nodes is recommended.  2. Small 5 mm palpable, oval mass in the left breast 3:00 areolar margin is at an intermediate suspicion. Ultrasound guided biopsy is recommended.  3. Isoechoic 8 mm mass in the right breast 10:00 5 cm from the nipple is also an intermediate suspicion for malignancy, and ultrasound guided biopsy is recommended.  4. Stereotactic guided biopsy for the highly suspicious calcifications in the left breast also recommended as detailed in the mammography report.     03/22/2017 Initial Biopsy   Diagnosis 03/22/17 Lymph node, needle/core biopsy, left axillary node -METASTATIC CARCINOMA, SEE COMMENT Microscopic Comment   03/22/2017 Receptors her2   Lymph node biopsy ER 95% positive, strong staining, PR 5% positive, weak staining, HER-2 negative, with HER2/CEP17 ratio 1.23 and copy #3.95.   03/27/2017 Pathology Results   Diagnosis 03/27/17 1. Breast, left, needle core biopsy -FIBROADENOMATOID NODULE WITH CALCIFICATIONS 2.Breast, left, needle core biopsy -DUCTAL CARCINOMA IN SITU, HIGH GRADE -SEE COMMENT  3. Breast, right, needle, core biopsy -FIBROADENOMA -NO MALIGNANCY IDENTIFIED    03/29/2017 Initial Diagnosis   Breast cancer metastasized to axillary lymph node, left ([place])   04/07/2017 Imaging   CT CAP 04/07/17 IMPRESSION: 1. Several prominent left axillary lymph nodes which may correspond to biopsy proven axillary lymph node metastasis. 2. No thoracic adenopathy identified or evidence of distant metastatic disease.   04/07/2017 Imaging   Bone whole body scan 04/07/17 IMPRESSION: Negative for evidence of osseous metastatic disease.   04/17/2017 Genetic Testing   BRCA2 c.[id number] (p.Glu260Serfs*15) pathogenic mutation identified on the 9 gene STAT panel.  The STAT Breast cancer panel offered by [facility] includes sequencing  and rearrangement analysis for the following 9 genes:  ATM,  BRCA1, BRCA2, CDH1, CHEK2, PALB2, PTEN, STK11 and TP53.   The report date is April 17, 2017.  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  The Hereditary Gene Panel offered by [facility] includes sequencing and/or deletion duplication testing of the following 46 genes: APC, ATM, AXIN2, BARD1, BMPR1A, BRCA1, BRCA2, BRIP1, CDH1, CDKN2A (p14ARF), CDKN2A (p16INK4a), CHEK2, CTNNA1, DICER1, EPCAM (Deletion/duplication testing only), GREM1 (promoter region deletion/duplication testing only), KIT, MEN1, MLH1, MSH2, MSH3, MSH6, MUTYH, NBN, NF1, NHTL1, PALB2, PDGFRA, PMS2, POLD1, POLE, PTEN, RAD50, RAD51C, RAD51D, SDHB, SDHC, SDHD, SMAD4, SMARCA4. STK11, TP53, TSC1, TSC2, and VHL.  The following genes were evaluated for sequence changes only: SDHA and HOXB13 c.251G>A variant only.  The report date is April 29, 2017.    04/27/2017 Surgery   Surge[facility] 04/27/17 BILATERAL SKIN SPARING MASTECTOMY and LEFT AXILLARY LYMPH NODE and BILATERAL BREAST RECONSTRUCTION WITH PLACEMENT OF TISSUE EXPANDER AND FLEX HD (ACELLULAR HYDRATED DERMIS) Bilateral BY Dr. Barry Dienes and Dr. Marla Roe    04/27/2017 Pathology Results    Diagnosis  04/27/17 1. Breast, simple mastectomy, Right - FIBROCYSTIC CHANGES WITH ADENOSIS. - USUAL DUCTAL HYPERPLASIA. - HEALING BIOPSY SITE. - THERE IS NO EVIDENCE OF MALIGNANCY. 2. Breast, simple mastectomy, Left - INVASIVE DUCTAL CARCINOMA, GRADE II/III, SPANNING 1.2 CM. - DUCTAL CARCINOMA IN SITU WITH CALCIFICATIONS, HIGH GRADE. - INVASIVE DUCTAL CARCINOMA IS FOCALLY PRESENT AT THE ANTERIOR MARGIN. - DUCTAL CARCINOMA IN SITU IS BROADLY 0.1 CM TO THE POSTERIOR MARGIN. - SEE ONCOLOGY TABLE BELOW. 3. Lymph nodes, regional resection, Left axillary - METASTATIC CARCINOMA IN 8 OF 21 LYMPH NODES (8/21). - SEE COMMENT   06/09/2017 - 11/16/2017 Chemotherapy   Adriamycin and Cytoxan every 2 weeks, for 4  cycles 06/09/17-07/21/17, followed by weekly Abraxane and carboplatin for 12 weeks starting 08/11/17. Held Carbo with cycle 2 due to poor toleration. Changed to single agent Abraxane for Cycle 3 on 09/08/17. Completed on 11/16/17 with reduced Abraxane due to body aches.    12/06/2017 Surgery   BREAST CAPSULECTOMY WITH IMPLANT EXCHANGE and PLACEMENT OF BREAST IMPLANTS and REMOVAL PORT-A-CATH  by Dr. Marla Roe on 12/06/17   12/18/2017 Survivorship   Survivorship clinic with NP Mendel Ryder    01/03/2018 - 02/19/2018 Radiation Therapy   Radiation therapy 01/03/18 - 02/19/18.   03/2018 -  Anti-estrogen oral therapy   Adjuvant tamoxifen started in July 2019, switched to letrozole started 09/2018 after BSO but due to allergy reaction with hives, ithcing and swelling she d/c and restarted Tamoxifen.     08/31/2018 Surgery   08/31/2018 BSO With Dr. Denman George  Diagnosis 1. Ovary and fallopian tube, right RIGHT OVARY AND RIGHT FALLOPIAN TUBE: - UNREMARKABLE. - NO ENDOMETRIOSIS OR MALIGNANCY. 2. Ovary and fallopian tube, left LEFT OVARY AND LEFT FALLOPIAN TUBE: - UNREMARKABLE. - NO ENDOMETRIOSIS OR MALIGNANCY.     History of Present Illness: The patient's records from the referring physician were obtained and reviewed and the patient interviewed to confirm this HPI.  Madison Savage presents today to discuss recent sensory phenomena.  She describes tingling of both feet and lower legs which "comes and goes" in depth and severity.  This developed suddenly after a day of hard physical labor, and was accompanied by back tightness and feeling of tightness in face and arms.  Facial sensory changes are not fully described as numbness, but rather "tightness on both sides, accompanied by jaw pain and  headache".  Gabapentin has not really helped any of her symptoms.  She has been undergoing chiropractic treatments which have helped somewhat.  No frank migraines, no seizures.  Still very active, exercising intensely up to  several hours ope  Medications: Current Outpatient Medications on File Prior to Visit  Medication Sig Dispense Refill  . ALPRAZolam (XANAX) 0.25 MG tablet Take 1 tablet (0.25 mg total) by mouth at bedtime as needed for anxiety. 30 tablet 0  . ascorbic acid (VITAMIN C) 500 MG tablet Take by mouth.    Marland Kitchen azithromycin (ZITHROMAX Z-PAK) 250 MG tablet Take as prescribed 6 each 0  . b complex vitamins capsule Take 1 capsule by mouth daily.    . Cholecalciferol (VITAMIN D-3 PO) Take 1 capsule by mouth daily.    Marland Kitchen gabapentin (NEURONTIN) 100 MG capsule Take 3 capsules (300 mg total) by mouth at bedtime. 90 capsule 5  . ibuprofen (ADVIL,MOTRIN) 800 MG tablet Take 1 tablet (800 mg total) by mouth every 8 (eight) hours as needed. 30 tablet 0  . MAGNESIUM PO Take 1 tablet by mouth daily. Take Muscle cramps    . methylPREDNISolone (MEDROL DOSEPAK) 4 MG TBPK tablet Take as prescribed 21 tablet 0  . Misc Natural Products (CALCIUM PLUS ADVANCED PO) Take 1 tablet by mouth daily.    . Multiple Vitamins-Calcium (ONE-A-DAY WOMENS PO) Take 1 tablet by mouth daily.    Marland Kitchen OVER THE COUNTER MEDICATION Take 1 capsule by mouth daily. Biotin/MSM    . tamoxifen (NOLVADEX) 20 MG tablet TAKE 1 TABLET BY MOUTH EVERY DAY 90 tablet 1   Current Facility-Administered Medications on File Prior to Visit  Medication Dose Route Frequency Provider Last Rate Last Admin  . sodium chloride flush (NS) 0.9 % injection 10 mL  10 mL Intravenous PRN [name], MD   10 mL at 07/21/17 1323    Allergies:  Allergies  Allergen Reactions  . Clindamycin Anaphylaxis, Shortness Of Breath and Rash  . Contrast Media [Iodinated Diagnostic Agents] Shortness Of Breath  . Penicillins Nausea And Vomiting and Rash  . Codeine Itching   Past Medical History:  Past Medical History:  Diagnosis Date  . Anemia   . Anxiety   . BRCA2 positive   . Chronic ITP (idiopathic thrombocytopenia) ([facility])   . Family history of breast cancer   . Family history of  ovarian cancer   . Headache    last migraine...9/24  . History of cancer chemotherapy    left breast 06-09-2017  to 11-16-2017  . History of cardiac murmur as a child    infant  . History of external beam radiation therapy    left breast   01-03-2018  to 02-19-2018  . MTHFR mutation ([place])   . PONV (postoperative nausea and vomiting)   . 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 [facility]) oncologist-- dr Burr Medico   dx 03-22-2017--- 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/  04-27-2018  s/p  bilateral mastectomy with left sln dissection's with reconstruction,  completed chemothearpy 11-16-2017,  completed radiation 02-19-2018,  started tamoxifen 07/ 2019  . Wears contact lenses    Past Surgical History:  Past Surgical History:  Procedure Laterality Date  . AXILLARY LYMPH NODE DISSECTION Left 04/27/2017   Procedure: AXILLARY LYMPH NODE DISSECTION;  Surgeon: [name], MD;  Location: [facility];  Service: General;  Laterality: Left;  . BILATERAL TOTAL MASTECTOMY WITH AXILLARY LYMPH NODE DISSECTION Bilateral 04/27/2017  Procedure: SKIN SPARING MASTECTOMY;  Surgeon: [name], MD;  Location: [facility];  Service: General;  Laterality: Bilateral;  . BREAST CAPSULECTOMY WITH IMPLANT EXCHANGE Bilateral 12/06/2017   Procedure: BREAST CAPSULECTOMY WITH IMPLANT EXCHANGE;  Surgeon: Wallace Going, DO;  Location: [facility];  Service: Plastics;  Laterality: Bilateral;  . BREAST RECONSTRUCTION WITH PLACEMENT OF TISSUE EXPANDER AND FLEX HD (ACELLULAR HYDRATED DERMIS) Bilateral 04/27/2017   Procedure: BILATERAL BREAST RECONSTRUCTION WITH PLACEMENT OF TISSUE EXPANDER AND FLEX HD (ACELLULAR HYDRATED DERMIS);  Surgeon: Wallace Going, DO;  Location: [facility];  Service: Plastics;  Laterality: Bilateral;  . PLACEMENT OF BREAST IMPLANTS Bilateral 12/06/2017   Procedure: PLACEMENT OF BREAST  IMPLANTS;  Surgeon: Wallace Going, DO;  Location: [facility];  Service: Plastics;  Laterality: Bilateral;  . PORT-A-CATH REMOVAL Right 12/06/2017   Procedure: REMOVAL PORT-A-CATH;  Surgeon: Wallace Going, DO;  Location: [facility];  Service: Plastics;  Laterality: Right;  . PORTACATH PLACEMENT Right 06/01/2017   Procedure: INSERTION PORT-A-CATH RIGHT SUBCLAVIAN;  Surgeon: [name], MD;  Location: [facility];  Service: General;  Laterality: Right;  . ROBOTIC ASSISTED SALPINGO OOPHERECTOMY Bilateral 08/31/2018   Procedure: XI ROBOTIC ASSISTED BILATERAL  SALPINGO OOPHORECTOMY;  Surgeon: Everitt Amber, MD;  Location: [facility];  Service: Gynecology;  Laterality: Bilateral;  . WISDOM TOOTH EXTRACTION     Social History:  Social History   Socioeconomic History  . Marital status: Married    Spouse name: Not on file  . Number of children: 3  . Years of education: Not on file  . Highest education level: Not on file  Occupational History    Employer: UNEMPLOYED  Tobacco Use  . Smoking status: Former Smoker    Packs/day: 0.50    Years: 20.00    Pack years: 10.00    Types: Cigarettes    Quit date: 03/05/2017    Years since quitting: 2.8  . Smokeless tobacco: Never Used  Substance and Sexual Activity  . Alcohol use: No  . Drug use: No  . Sexual activity: Yes    Partners: Male    Birth control/protection: None  Other Topics Concern  . Not on file  Social History Narrative   Stay at home mom   3 children ages 86, 26, 3   Social Determinants of Health   Financial Resource Strain:   . Difficulty of Paying Living Expenses:   Food Insecurity:   . Worried About Charity fundraiser in the Last Year:   . Arboriculturist in the Last Year:   Transportation Needs:   . Film/video editor (Medical):   Marland Kitchen Lack of Transportation (Non-Medical):   Physical Activity:   . Days of Exercise per Week:   . Minutes of Exercise per Session:     Stress:   . Feeling of Stress :   Social Connections:   . Frequency of Communication with Friends and Family:   . Frequency of Social Gatherings with Friends and Family:   . Attends Religious Services:   . Active Member of Clubs or Organizations:   . Attends Archivist Meetings:   Marland Kitchen Marital Status:   Intimate Partner Violence:   . Fear of Current or Ex-Partner:   . Emotionally Abused:   Marland Kitchen Physically Abused:   . Sexually Abused:    Family History:  Family History  Problem Relation Age of Onset  . Breast cancer Paternal Aunt  83  . Ovarian cancer Paternal Aunt 37  . Cancer Paternal Grandmother        breast cancer   . Cancer Maternal Aunt        lymphoma   . Cancer Paternal Grandfather        throat cancer   . Cancer Paternal Aunt        lung cancer  . Cirrhosis Father   . AAA (abdominal aortic aneurysm) Maternal Grandfather   . Leukemia Paternal Aunt     Review of Systems: Constitutional: Doesn't report fevers, chills or abnormal weight loss Eyes: Doesn't report blurriness of vision Ears, nose, mouth, throat, and face: Doesn't report sore throat Respiratory: Doesn't report cough, dyspnea or wheezes Cardiovascular: Doesn't report palpitation, chest discomfort  Gastrointestinal:  Doesn't report nausea, constipation, diarrhea GU: Doesn't report incontinence Skin: Doesn't report skin rashes Neurological: Per HPI Musculoskeletal: Doesn't report joint pain Behavioral/Psych: Doesn't report anxiety  Physical Exam: Vitals:   01/13/20 1104  BP: 111/76  Pulse: 88  Resp: 18  Temp: 98.7 F (37.1 C)  SpO2: 98%   KPS: 90. General: Alert, cooperative, pleasant, in no acute distress Head: Normal EENT: No conjunctival injection or scleral icterus.  Lungs: Resp effort normal Cardiac: Regular rate Abdomen: Non-distended abdomen Skin: No rashes cyanosis or petechiae. Extremities: No clubbing or edema  Neurologic Exam: Mental Status: Awake, alert, attentive to  examiner. Oriented to self and environment. Language is fluent with intact comprehension.  Cranial Nerves: Visual acuity is grossly normal. Visual fields are full. Extra-ocular movements intact. No ptosis. Face is symmetric Motor: Tone and bulk are normal. Power is full in both arms and legs. Reflexes are decreased at patellas, no pathologic reflexes present.  Sensory: Stocking sensory impairment Gait: Normal.   Labs: I have reviewed the data as listed    Component Value Date/Time   NA 142 11/28/2019 1051   NA 139 09/08/2017 1024   K 4.3 11/28/2019 1051   K 4.0 09/08/2017 1024   CL 107 11/28/2019 1051   CO2 27 11/28/2019 1051   CO2 26 09/08/2017 1024   GLUCOSE 98 11/28/2019 1051   GLUCOSE 99 09/08/2017 1024   BUN 15 11/28/2019 1051   BUN 13.6 09/08/2017 1024   CREATININE 0.96 11/28/2019 1051   CREATININE 0.8 09/08/2017 1024   CALCIUM 9.2 11/28/2019 1051   CALCIUM 9.1 09/08/2017 1024   PROT 7.1 11/28/2019 1051   PROT 6.3 (L) 09/08/2017 1024   ALBUMIN 4.4 11/28/2019 1051   ALBUMIN 3.9 09/08/2017 1024   AST 16 11/28/2019 1051   AST 14 09/08/2017 1024   ALT 14 11/28/2019 1051   ALT 14 09/08/2017 1024   ALKPHOS 61 11/28/2019 1051   ALKPHOS 75 09/08/2017 1024   BILITOT 0.3 11/28/2019 1051   BILITOT 0.30 09/08/2017 1024   GFRNONAA >60 11/28/2019 1051   GFRAA >60 11/28/2019 1051   Lab Results  Component Value Date   WBC 5.7 11/28/2019   NEUTROABS 3.6 11/28/2019   HGB 14.6 11/28/2019   HCT 44.0 11/28/2019   MCV 90.5 11/28/2019   PLT 195 11/28/2019    Imaging: CLINICAL DATA:  Initial evaluation for left upper extremity pain and paresthesias.  EXAM: MRI CERVICAL SPINE WITHOUT CONTRAST  TECHNIQUE: Multiplanar, multisequence MR imaging of the cervical spine was performed. No intravenous contrast was administered.  COMPARISON:  None available.  FINDINGS: Alignment: Mild straightening of the normal cervical lordosis. No listhesis or subluxation.  Vertebrae:  Vertebral body height maintained without evidence for acute or  chronic fracture. Bone marrow signal intensity within normal limits. No discrete or worrisome osseous lesions. No abnormal marrow edema.  Cord: Signal intensity within the cervical spinal cord is normal.  Posterior Fossa, vertebral arteries, paraspinal tissues: Visualized brain and posterior fossa within normal limits. Craniocervical junction normal. Paraspinous and prevertebral soft tissues within normal limits. Normal intravascular flow voids seen within the vertebral arteries bilaterally.  Disc levels:  C2-C3: Unremarkable.  C3-C4:  Unremarkable.  C4-C5: Mild annular disc bulge. No significant spinal stenosis. Foramina remain patent.  C5-C6: Mild annular disc bulge. No significant spinal stenosis. Foramina remain patent.  C6-C7:  Mild annular disc bulge.  No canal or foraminal stenosis.  C7-T1:  Negative interspace.  Mild facet hypertrophy.  No stenosis.  Visualized upper thoracic spine demonstrates no significant finding.  IMPRESSION: 1. Mild noncompressive disc bulging at C4-5 through C6-7 without stenosis or neural impingement. 2. Otherwise unremarkable MRI of the cervical spine.   Electronically Signed   By: Jeannine Boga M.D.   On: 12/15/2019 01:42  CLINICAL DATA:  Initial evaluation for back pain with radiation into the lower extremities, left-sided paresthesias.  EXAM: MRI THORACIC SPINE WITHOUT CONTRAST  TECHNIQUE: Multiplanar, multisequence MR imaging of the thoracic spine was performed. No intravenous contrast was administered.  COMPARISON:  None available.  FINDINGS: Alignment: Physiologic with preservation of the normal thoracic kyphosis. No listhesis or subluxation.  Vertebrae: Vertebral body height maintained without evidence for acute or chronic fracture. Small chronic endplate Schmorl's node deformities noted about the T10-11 and T11-12  interspaces. Underlying bone marrow signal intensity within normal limits. No discrete or worrisome osseous lesions. No abnormal marrow edema.  Cord: Signal intensity within the thoracic spinal cord is normal. Normal cord caliber morphology.  Paraspinal and other soft tissues: Unremarkable.  Disc levels:  T10-11: Minimal disc bulge. Associated chronic endplate Schmorl's node deformities. No significant canal or foraminal stenosis.  T11-12: Small chronic endplate Schmorl's node deformities without significant disc bulge. No canal or foraminal stenosis.  No other significant disc pathology seen within the thoracic spine. No canal or foraminal stenosis. No neural impingement.  IMPRESSION: 1. Minimal degenerative disc disease at T10-11 and T11-12 without significant stenosis. 2. Otherwise unremarkable and normal MRI of the thoracic spine. No other significant disc pathology, stenosis, or evidence for neural impingement.   Electronically Signed   By: Jeannine Boga M.D.   On: 12/15/2019 03:56   Assessment/Plan Chemotherapy associated peripheral neuropathy  Madison Savage presents with symmetric, length dependent peripheral neuropathy, affecting small and large fibers.  Etiology is suspected secondary to platinum and taxol based chemotherapy exposure.  No "second hit" process such as diabetes, alcohol abuse.  Her neck, arm and face complaints are clearly provoked by palpation of soft tissue and do not localize neuro-anatomically.  It is unclear what the underlying etiology is here.  Do not suspect CNS process and do not recommend any further workup such as brain MRI, unless condition evolves or progresses further.  Spine studies are reviewed and are normal.  For medical management, she may discontinue gabapentin if not helpful, as this will only affect positive symptoms (ie pain, paresthesias) which are not dominant clinical features for her.  She is experiencing  some drowsiness as a side effect, which is impactful given her busy lifestyle.    We spent twenty additional minutes teaching regarding the natural history, biology, and historical experience in the treatment of neurologic complications of cancer.   We appreciate the opportunity to participate in the care  of Madison Savage.   All questions were answered. The patient knows to call the clinic with any problems, questions or concerns. No barriers to learning were detected.  The total time spent in the encounter was 40 minutes and more than 50% was on counseling and review of test results   Ventura Sellers, MD Medical Director of Neuro-Oncology [facility] [place] 01/13/20 3:39 PM

## 2020-01-15 ENCOUNTER — Other Ambulatory Visit: Payer: Self-pay | Admitting: Nurse Practitioner

## 2020-01-15 ENCOUNTER — Encounter: Payer: Self-pay | Admitting: Nurse Practitioner

## 2020-01-15 DIAGNOSIS — M79609 Pain in unspecified limb: Secondary | ICD-10-CM

## 2020-01-15 DIAGNOSIS — R202 Paresthesia of skin: Secondary | ICD-10-CM

## 2020-02-05 ENCOUNTER — Ambulatory Visit: Payer: BC Managed Care – PPO | Attending: Nurse Practitioner | Admitting: Physical Therapy

## 2020-02-05 ENCOUNTER — Encounter: Payer: Self-pay | Admitting: Physical Therapy

## 2020-02-05 ENCOUNTER — Other Ambulatory Visit: Payer: Self-pay

## 2020-02-05 DIAGNOSIS — M542 Cervicalgia: Secondary | ICD-10-CM | POA: Diagnosis not present

## 2020-02-05 DIAGNOSIS — G8929 Other chronic pain: Secondary | ICD-10-CM | POA: Diagnosis not present

## 2020-02-05 DIAGNOSIS — M62838 Other muscle spasm: Secondary | ICD-10-CM | POA: Insufficient documentation

## 2020-02-05 DIAGNOSIS — M25512 Pain in left shoulder: Secondary | ICD-10-CM | POA: Insufficient documentation

## 2020-02-05 NOTE — Therapy (Signed)
[place], [place], [address] Phone: [phone]   Fax:  [phone]  Physical Therapy Evaluation  Patient Details  Name: [place] Savage MRN: [id number] Date of Birth: [birth year] Referring Provider (PT): Cira Rue NP   Encounter Date: 02/05/2020  PT End of Session - 02/05/20 1021    Visit Number  1    Number of Visits  9    Date for PT Re-Evaluation  04/01/20    Authorization Type  BCBS    PT Start Time  1016    PT Stop Time  1101    PT Time Calculation (min)  45 min    Activity Tolerance  Patient tolerated treatment well    Behavior During Therapy  [facility] for tasks assessed/performed       Past Medical History:  Diagnosis Date  . Anemia   . Anxiety   . BRCA2 positive   . Chronic ITP (idiopathic thrombocytopenia) (HCC)   . Family history of breast cancer   . Family history of ovarian cancer   . Headache    last migraine...9/24  . History of cancer chemotherapy    left breast 06-09-2017  to 11-16-2017  . History of cardiac murmur as a child    infant  . History of external beam radiation therapy    left breast   01-03-2018  to 02-19-2018  . MTHFR mutation ([place])   . PONV (postoperative nausea and vomiting)   . 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 Wears contact lenses     Past Surgical History:  Procedure Laterality Date  . AXILLARY LYMPH NODE DISSECTION Left 04/27/2017   Procedure: AXILLARY LYMPH NODE DISSECTION;  Surgeon: [name], MD;  Location: [place];  Service: General;  Laterality: Left;  . BILATERAL TOTAL MASTECTOMY WITH AXILLARY LYMPH  NODE DISSECTION Bilateral 04/27/2017   Procedure: SKIN SPARING MASTECTOMY;  Surgeon: [name], MD;  Location: [place];  Service: General;  Laterality: Bilateral;  . BREAST CAPSULECTOMY WITH IMPLANT EXCHANGE Bilateral 12/06/2017   Procedure: BREAST CAPSULECTOMY WITH IMPLANT EXCHANGE;  Surgeon: Wallace Going, DO;  Location: [name];  Service: Plastics;  Laterality: Bilateral;  . BREAST RECONSTRUCTION WITH PLACEMENT OF TISSUE EXPANDER AND FLEX HD (ACELLULAR HYDRATED DERMIS) Bilateral 04/27/2017   Procedure: BILATERAL BREAST RECONSTRUCTION WITH PLACEMENT OF TISSUE EXPANDER AND FLEX HD (ACELLULAR HYDRATED DERMIS);  Surgeon: Wallace Going, DO;  Location: [place];  Service: Plastics;  Laterality: Bilateral;  . PLACEMENT OF BREAST IMPLANTS Bilateral 12/06/2017   Procedure: PLACEMENT OF BREAST IMPLANTS;  Surgeon: Wallace Going, DO;  Location: [place];  Service: Plastics;  Laterality: Bilateral;  . PORT-A-CATH REMOVAL Right 12/06/2017   Procedure: REMOVAL PORT-A-CATH;  Surgeon: Wallace Going, DO;  Location: [place];  Service: Plastics;  Laterality: Right;  . PORTACATH PLACEMENT Right 06/01/2017   Procedure: INSERTION PORT-A-CATH RIGHT SUBCLAVIAN;  Surgeon: [name], MD;  Location: [place];  Service: General;  Laterality: Right;  . ROBOTIC ASSISTED SALPINGO OOPHERECTOMY Bilateral 08/31/2018   Procedure: XI ROBOTIC ASSISTED BILATERAL  SALPINGO OOPHORECTOMY;  Surgeon: Everitt Amber, MD;  Location: [facility];  Service: Gynecology;  Laterality: Bilateral;  . WISDOM TOOTH EXTRACTION      There were no vitals filed for this visit.   Subjective Assessment - 02/05/20 1022    Subjective  pt 49 y.o F with hx of neck and  L shoulder pain secondary to breast cx and had a mastectomy that she was seen at cancer rehab. she reported she felt like do a back flip back in [place] and had back  pain that eventually resolved. March she got a farm table and worked on sanding the table. the next day she noted having numbness in the LLE that progressed up the back to the back and arm. she did got to a massage therapist which has helped.    How long can you sit comfortably?  unlimited    How long can you stand comfortably?  unlimited    How long can you walk comfortably?  unlimited    Diagnostic tests  12/14/2019 MRI IMPRESSION:1. Minimal degenerative disc disease at T10-11 and T11-12 withoutsignificant stenosis.2. Otherwise unremarkable and normal MRI of the thoracic spine. Noother significant disc pathology, stenosis, or evidence for neuralimpingement.    Patient Stated Goals  to decrease the pain,    Currently in Pain?  Yes    Pain Score  0-No pain   reports more tightness and discomfort then pain   Pain Orientation  Left    Pain Descriptors / Indicators  Tightness    Pain Type  Chronic pain    Pain Onset  More than a month ago    Pain Frequency  Intermittent    Aggravating Factors   using the UE for long periods of time, looking down for long periods fo time    Pain Relieving Factors  heating pack         [facility] PT Assessment - 02/05/20 0001      Assessment   Medical Diagnosis  Paresthesia and pain of left extremity M79.609, R20.2    Referring Provider (PT)  Cira Rue NP    Onset Date/Surgical Date  --   March 2021   Hand Dominance  Right    Prior Therapy  yes      Precautions   Precautions  None      Restrictions   Weight Bearing Restrictions  No      Balance Screen   Has the patient fallen in the past 6 months  No    Has the patient had a decrease in activity level because of a fear of falling?   No    Is the patient reluctant to leave their home because of a fear of falling?   No      Home Film/video editor residence      Prior Function   Level of Independence  Independent    Vocation  Unemployed      Cognition   Overall Cognitive  Status  Within Functional Limits for tasks assessed      Observation/Other Assessments   Focus on Therapeutic Outcomes (FOTO)   42% limited    predicted 31% limited     Posture/Postural Control   Posture/Postural Control  Postural limitations    Postural Limitations  Rounded Shoulders;Forward head      ROM / Strength   AROM / PROM / Strength  AROM;Strength      AROM   AROM Assessment Site  Cervical    Cervical Flexion  50   end range pain  noted on the R side    Cervical Extension  52    Cervical - Right Side Bend  42    Cervical - Left Side Bend  40    Cervical - Right Rotation  69    Cervical - Left Rotation  63      Strength   Strength Assessment Site  Shoulder    Right/Left Shoulder  Right;Left    Right Shoulder Flexion  4+/5    Right Shoulder Extension  4+/5    Right Shoulder ABduction  4+/5    Right Shoulder Internal Rotation  4+/5    Right Shoulder External Rotation  4+/5    Left Shoulder Flexion  4/5    Left Shoulder Extension  4/5    Left Shoulder ABduction  4/5    Left Shoulder Internal Rotation  4/5   produced concordant pain   Left Shoulder External Rotation  4/5      Palpation   Palpation comment  TTP along bil upper trap/ levator scapulae, increased tension inthe L pec major/ minor and scalenes      Special Tests    Special Tests  Rotator Cuff Impingement;Cervical    Cervical Tests  Spurling's;Vertebral Artery Test;Dictraction    Rotator Cuff Impingment tests  --   not tested     Spurling's   Findings  Negative      Distraction Test   Findngs  Negative      Vertebral Artery Test    Findings  Not Tested                  Objective measurements completed on examination: See above findings.      [facility] Adult PT Treatment/Exercise - 02/05/20 0001      Exercises   Exercises  Neck      Manual Therapy   Manual Therapy  Joint mobilization    Manual therapy comments  skilled palpation and monitoring of pt throughout TPDN    Joint  Mobilization  first rib mobs grade III      Neck Exercises: Stretches   Upper Trapezius Stretch  Left;1 rep;30 seconds       Trigger Point Dry Needling - 02/05/20 0001    Consent Given?  Yes    Education Handout Provided  Previously provided    Muscles Treated Head and Neck  Scalenes    Muscles Treated Upper Quadrant  Subclavius;Pectoralis major    Scalenes Response  Twitch reponse elicited;Palpable increased muscle length   L only   Pectoralis Major Response  Twitch response elicited;Palpable increased muscle length   L only   Subclavius Response  Palpable increased muscle length;Twitch response elicited   L only          PT Education - 02/05/20 1123    Education Details  evaluation findings, POC, goals, HEP with proper form/ rationale    Person(s) Educated  Patient    Methods  Explanation;Verbal cues;Handout    Comprehension  Verbalized understanding;Verbal cues required       PT Short Term Goals - 02/05/20 1124      PT SHORT TERM GOAL #1   Title  pt to be I with inital HEP    Time  4    Period  Weeks    Status  New    Target Date  03/04/20        PT Long Term Goals - 02/05/20 1248      PT LONG TERM GOAL #1   Title  pt to report </= 1/10 pain in the L shoulder and neck with minimal to no report of stiffness    Period  Weeks    Status  New    Target Date  04/01/20      PT LONG TERM GOAL #2   Title  increase Gross L shoulder strength to >/= 4+/5 to promote stability with lifting/ carrying and ADls with no report of pain or stiffness    Baseline  -    Time  8      PT LONG TERM GOAL #3   Title  increase FOTO score to </= 31% limited to demo improvement in function    Time  8    Period  Weeks    Status  New    Target Date  04/01/20      PT LONG TERM GOAL #4   Title  pt to be I with all HEP given to maintain and progress current level of function    Baseline  -    Time  8    Period  Weeks    Status  New    Target Date  04/01/20              Plan - 02/05/20 1123    Clinical Impression Statement  pt presents to OPPT with CC of L shoulder tightness and aggravation that started in March 2021 after sanding a farm table. She has functional neck and shoulder ROM. mild weakness inthe LUE compared bil with reproduction of concordant symptoms with resisted IR. TTP along bil upper trap/ levator scapulae, increased tension inthe L pec major/ minor and scalenes. educated and consent was provided for TPDN focusing on L scalenes, pec major and sub-clavius which she responded well to in sessoin. She would beneift from physical therapy to decrease L shoulder pain/ stiffness, improve strength, and maximize overall function by addressing the deficits listed.    Personal Factors and Comorbidities  Comorbidity 3+    Comorbidities  hx of anxity, anemia, and CX    Examination-Activity Limitations  Lift    Stability/Clinical Decision Making  Evolving/Moderate complexity    Clinical Decision Making  Moderate    Rehab Potential  Good    PT Frequency  1x / week    PT Duration  8 weeks    PT Treatment/Interventions  ADLs/Self Care Home Management;Cryotherapy;Therapeutic activities;Therapeutic exercise;Neuromuscular re-education;Manual techniques;Passive range of motion;Dry needling;Taping;Spinal Manipulations;Patient/family education    PT Next Visit Plan  review HEP/ update PRN, response to DN and continue TPDN focus on L scalenes, upper trap and surroudning musculature, funcitonal scapulart stability foam roll routine,    PT Home Exercise Plan  [id number] - upper trap stretch, shoulder IR with red band, rib mobs    Consulted and Agree with Plan of Care  Patient       Patient will benefit from skilled therapeutic intervention in order to improve the following deficits and impairments:  Improper body mechanics, Increased muscle spasms, Decreased strength, Postural dysfunction, Pain  Visit Diagnosis: Chronic left shoulder  pain  Cervicalgia  Other muscle spasm     Problem List Patient Active Problem List   Diagnosis Date Noted  . Chemotherapy-induced peripheral neuropathy ([place]) 01/13/2020  . Breast asymmetry following reconstructive surgery 11/16/2018  . S/P mastectomy, bilateral 07/24/2018  . S/P breast reconstruction 07/24/2018  . Port-A-Cath in place 08/18/2017  . Genetic testing 04/17/2017  . BRCA2 gene mutation positive 04/17/2017  . Family history of breast cancer   . Family  history of ovarian cancer   . 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 Cough 03/10/2014  . Esophagitis 03/10/2014  . Chronic ITP (idiopathic thrombocytopenia) (HCC) 03/10/2014  . ALLERGIC REACTION, ACUTE 07/22/2008   Starr Lake PT, DPT, LAT, ATC  02/05/20  12:54 PM      [facility] [address] [place], [place], [address] Phone: [phone]   Fax:  [phone]  Name: MARCIA HARTWELL MRN: [id number] Date of Birth: July 07, 1971

## 2020-02-18 ENCOUNTER — Other Ambulatory Visit: Payer: Self-pay

## 2020-02-18 ENCOUNTER — Encounter: Payer: Self-pay | Admitting: Physical Therapy

## 2020-02-18 ENCOUNTER — Ambulatory Visit: Payer: BC Managed Care – PPO | Admitting: Physical Therapy

## 2020-02-18 DIAGNOSIS — G8929 Other chronic pain: Secondary | ICD-10-CM | POA: Diagnosis not present

## 2020-02-18 DIAGNOSIS — M62838 Other muscle spasm: Secondary | ICD-10-CM

## 2020-02-18 DIAGNOSIS — M542 Cervicalgia: Secondary | ICD-10-CM | POA: Diagnosis not present

## 2020-02-18 DIAGNOSIS — M25512 Pain in left shoulder: Secondary | ICD-10-CM

## 2020-02-18 NOTE — Therapy (Addendum)
[facility] [address] [place], [place], [address] Phone: [phone]   Fax:  [phone]  Physical Therapy Treatment  Patient Details  Name: [name] MRN: [id number] Date of Birth: 02/11/1971 Referring Provider (PT): Santiago Glad NP   Encounter Date: 02/18/2020   PT End of Session - 02/18/20 1500    Visit Number 2    Number of Visits 9    Date for PT Re-Evaluation 04/01/20    Authorization Type BCBS    PT Start Time 1417    PT Stop Time 1459    PT Time Calculation (min) 42 min    Activity Tolerance Patient tolerated treatment well    Behavior During Therapy [facility] for tasks assessed/performed           Past Medical History:  Diagnosis Date  . Anemia   . Anxiety   . BRCA2 positive   . Chronic ITP (idiopathic thrombocytopenia) (HCC)   . Family history of breast cancer   . Family history of ovarian cancer   . Headache    last migraine...9/24  . History of cancer chemotherapy    left breast 06-09-2017  to 11-16-2017  . History of cardiac murmur as a child    infant  . History of external beam radiation therapy    left breast   01-03-2018  to 02-19-2018  . MTHFR mutation (HCC)   . PONV (postoperative nausea and vomiting)   . 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 Wears contact lenses     Past Surgical History:  Procedure Laterality Date  . AXILLARY LYMPH NODE DISSECTION Left 04/27/2017   Procedure: AXILLARY LYMPH NODE DISSECTION;  Surgeon: Almond Lint, MD;  Location: [facility];  Service: General;  Laterality: Left;  . BILATERAL TOTAL MASTECTOMY WITH AXILLARY LYMPH NODE  DISSECTION Bilateral 04/27/2017   Procedure: SKIN SPARING MASTECTOMY;  Surgeon: Almond Lint, MD;  Location: [facility];  Service: General;  Laterality: Bilateral;  . BREAST CAPSULECTOMY WITH IMPLANT EXCHANGE Bilateral 12/06/2017   Procedure: BREAST CAPSULECTOMY WITH IMPLANT EXCHANGE;  Surgeon: Peggye Form, DO;  Location: [facility];  Service: Plastics;  Laterality: Bilateral;  . BREAST RECONSTRUCTION WITH PLACEMENT OF TISSUE EXPANDER AND FLEX HD (ACELLULAR HYDRATED DERMIS) Bilateral 04/27/2017   Procedure: BILATERAL BREAST RECONSTRUCTION WITH PLACEMENT OF TISSUE EXPANDER AND FLEX HD (ACELLULAR HYDRATED DERMIS);  Surgeon: Peggye Form, DO;  Location: [facility];  Service: Plastics;  Laterality: Bilateral;  . PLACEMENT OF BREAST IMPLANTS Bilateral 12/06/2017   Procedure: PLACEMENT OF BREAST IMPLANTS;  Surgeon: Peggye Form, DO;  Location: [facility];  Service: Plastics;  Laterality: Bilateral;  . PORT-A-CATH REMOVAL Right 12/06/2017   Procedure: REMOVAL PORT-A-CATH;  Surgeon: Peggye Form, DO;  Location: [facility];  Service: Plastics;  Laterality: Right;  . PORTACATH PLACEMENT Right 06/01/2017   Procedure: INSERTION PORT-A-CATH RIGHT SUBCLAVIAN;  Surgeon: Almond Lint, MD;  [facility];  Service: General;  Laterality: Right;  . ROBOTIC ASSISTED SALPINGO OOPHERECTOMY Bilateral 08/31/2018   Procedure: XI ROBOTIC ASSISTED BILATERAL  SALPINGO OOPHORECTOMY;  Surgeon: Adolphus Birchwood, MD;  Location: [facility];  Service:  Gynecology;  Laterality: Bilateral;  . WISDOM TOOTH EXTRACTION      There were no vitals filed for this visit.   Subjective Assessment - 02/18/20 1420    Subjective " I am feeling much better following the last session. I am a little sore in the R shoulder from going kayaking"    Diagnostic tests 12/14/2019 MRI IMPRESSION:1. Minimal degenerative disc disease at T10-11  and T11-12 withoutsignificant stenosis.2. Otherwise unremarkable and normal MRI of the thoracic spine. Noother significant disc pathology, stenosis, or evidence for neuralimpingement.    Currently in Pain? No/denies    Pain Score 0-No pain    Pain Orientation Left    Pain Descriptors / Indicators Aching    Pain Onset More than a month ago    Pain Frequency Intermittent              [facility] PT Assessment - 02/18/20 0001      Assessment   Medical Diagnosis Paresthesia and pain of left extremity M79.609, R20.2    Referring Provider (PT) Cira Rue NP                         [facility] Adult PT Treatment/Exercise - 02/18/20 0001      Neck Exercises: Machines for Strengthening   UBE (Upper Arm Bike) L1 x 4 min   fwd/ bwd x 2 min     Neck Exercises: Prone   Other Prone Exercise in quadruped I's Y's and T's 1 x 12 2#      Manual Therapy   Manual Therapy Soft tissue mobilization    Manual therapy comments skilled palpation and monitoring of pt throughout TPDN    Joint Mobilization first rib mobs grade III, T1-T8 PA grade III    Soft tissue mobilization IASTM along the bil upper trap/ levatro scapuale      Neck Exercises: Stretches   Upper Trapezius Stretch Left;30 seconds;Right;1 rep            Trigger Point Dry Needling - 02/18/20 0001    Consent Given? Yes    Education Handout Provided Previously provided    Muscles Treated Head and Neck Upper trapezius    Upper Trapezius Response Twitch reponse elicited;Palpable increased muscle length   bil               PT Education - 02/18/20 1500    Education Details reviewed FOTO assessment and provided associated handout. anatomy of the trapezius and referred pain patterns.    Person(s) Educated Patient    Methods Explanation;Verbal cues;Handout    Comprehension Verbalized understanding;Verbal cues required            PT Short Term Goals - 02/05/20 1124      PT SHORT TERM GOAL #1   Title pt to be I with  inital HEP    Time 4    Period Weeks    Status New    Target Date 03/04/20             PT Long Term Goals - 02/05/20 1248      PT LONG TERM GOAL #1   Title pt to report </= 1/10 pain in the L shoulder and neck with minimal to no report of stiffness    Period Weeks    Status New    Target Date 04/01/20      PT LONG TERM GOAL #2   Title increase Gross L shoulder strength to >/= 4+/5 to promote  stability with lifting/ carrying and ADls with no report of pain or stiffness    Baseline -    Time 8      PT LONG TERM GOAL #3   Title increase FOTO score to </= 31% limited to demo improvement in function    Time 8    Period Weeks    Status New    Target Date 04/01/20      PT LONG TERM GOAL #4   Title pt to be I with all HEP given to maintain and progress current level of function    Baseline -    Time 8    Period Weeks    Status New    Target Date 04/01/20                 Plan - 02/18/20 1501    Clinical Impression Statement pt arrives reporting mild soreness in the R shoulder which she noted was secondary to kayaking. continued TPDN focusing onbil upper trap followed with IASTM techniques and thoracic/ rib mobs. She did well with posterior shoulder strengthening but she did fatigue quickly.    PT Treatment/Interventions ADLs/Self Care Home Management;Cryotherapy;Therapeutic activities;Therapeutic exercise;Neuromuscular re-education;Manual techniques;Passive range of motion;Dry needling;Taping;Spinal Manipulations;Patient/family education    PT Next Visit Plan review HEP/ update PRN, response to DN and continue TPDN focus on L scalenes, upper trap and surroudning musculature, funcitonal scapulart stability foam roll routine, update HEP for quadruped I's, T's and Y's    PT Home Exercise Plan [id number] - upper trap stretch, shoulder IR with red band, rib mobs    Consulted and Agree with Plan of Care Patient           Patient will benefit from skilled therapeutic  intervention in order to improve the following deficits and impairments:  Improper body mechanics, Increased muscle spasms, Decreased strength, Postural dysfunction, Pain  Visit Diagnosis: Cervicalgia  Other muscle spasm  Chronic left shoulder pain     Problem List Patient Active Problem List   Diagnosis Date Noted  . Chemotherapy-induced peripheral neuropathy ([place]) 01/13/2020  . Breast asymmetry following reconstructive surgery 11/16/2018  . S/P mastectomy, bilateral 07/24/2018  . S/P breast reconstruction 07/24/2018  . Port-A-Cath in place 08/18/2017  . Genetic testing 04/17/2017  . BRCA2 gene mutation positive 04/17/2017  . Family history of breast cancer   . Family history of ovarian cancer   . 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 Cough 03/10/2014  . Esophagitis 03/10/2014  . Chronic ITP (idiopathic thrombocytopenia) (HCC) 03/10/2014  . ALLERGIC REACTION, ACUTE 07/22/2008   Starr Lake PT, DPT, LAT, ATC  02/18/20  3:05 PM      [facility] [address] [place], [place], [address] Phone: [phone]   Fax:  [phone]  Name: [place] MRN: [id number] Date of Birth: July 13, 1971

## 2020-02-26 ENCOUNTER — Ambulatory Visit: Payer: BC Managed Care – PPO | Admitting: Physical Therapy

## 2020-02-26 ENCOUNTER — Encounter: Payer: Self-pay | Admitting: Physical Therapy

## 2020-02-26 ENCOUNTER — Other Ambulatory Visit: Payer: Self-pay

## 2020-02-26 DIAGNOSIS — M25512 Pain in left shoulder: Secondary | ICD-10-CM | POA: Diagnosis not present

## 2020-02-26 DIAGNOSIS — M542 Cervicalgia: Secondary | ICD-10-CM | POA: Diagnosis not present

## 2020-02-26 DIAGNOSIS — G8929 Other chronic pain: Secondary | ICD-10-CM | POA: Diagnosis not present

## 2020-02-26 DIAGNOSIS — M62838 Other muscle spasm: Secondary | ICD-10-CM

## 2020-02-26 NOTE — Therapy (Signed)
[facility] [address] [place], [place], [address] Phone: [phone]   Fax:  [phone]  Physical Therapy Treatment  Patient Details  Name: Madison Savage MRN: [id number] Date of Birth: [birth year] Referring Provider (PT): Santiago Glad NP   Encounter Date: 02/26/2020   PT End of Session - 02/26/20 0933    Visit Number 3    Number of Visits 9    Date for PT Re-Evaluation 04/01/20    Authorization Type BCBS    PT Start Time 0932    PT Stop Time 1015    PT Time Calculation (min) 43 min    Activity Tolerance Patient tolerated treatment well    Behavior During Therapy [facility] for tasks assessed/performed           Past Medical History:  Diagnosis Date   Anemia    Anxiety    BRCA2 positive    Chronic ITP (idiopathic thrombocytopenia) (HCC)    Family history of breast cancer    Family history of ovarian cancer    Headache    last migraine...9/24   History of cancer chemotherapy    left breast 06-09-2017  to 11-16-2017   History of cardiac murmur as a child    infant   History of external beam radiation therapy    left breast   01-03-2018  to 02-19-2018   MTHFR mutation (HCC)    PONV (postoperative nausea and vomiting)    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 [facility]) oncologist-- dr Mosetta Putt   dx 03-22-2017--- 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/  04-27-2018  s/p  bilateral mastectomy with left sln dissection's with reconstruction,  completed chemothearpy 11-16-2017,  completed radiation 02-19-2018,  started tamoxifen 07/ 2019   Wears contact lenses     Past Surgical History:  Procedure Laterality Date   AXILLARY LYMPH NODE DISSECTION Left 04/27/2017   Procedure: AXILLARY LYMPH NODE DISSECTION;  Surgeon: Almond Lint, MD;  Location: [facility];  Service: General;  Laterality: Left;   BILATERAL TOTAL MASTECTOMY WITH AXILLARY LYMPH NODE  DISSECTION Bilateral 04/27/2017   Procedure: SKIN SPARING MASTECTOMY;  Surgeon: Almond Lint, MD;  Location: [facility];  Service: General;  Laterality: Bilateral;   BREAST CAPSULECTOMY WITH IMPLANT EXCHANGE Bilateral 12/06/2017   Procedure: BREAST CAPSULECTOMY WITH IMPLANT EXCHANGE;  Surgeon: Peggye Form, DO;  Location: [facility];  Service: Plastics;  Laterality: Bilateral;   BREAST RECONSTRUCTION WITH PLACEMENT OF TISSUE EXPANDER AND FLEX HD (ACELLULAR HYDRATED DERMIS) Bilateral 04/27/2017   Procedure: BILATERAL BREAST RECONSTRUCTION WITH PLACEMENT OF TISSUE EXPANDER AND FLEX HD (ACELLULAR HYDRATED DERMIS);  Surgeon: Peggye Form, DO;  Location: [facility];  Service: Plastics;  Laterality: Bilateral;   PLACEMENT OF BREAST IMPLANTS Bilateral 12/06/2017   Procedure: PLACEMENT OF BREAST IMPLANTS;  Surgeon: Peggye Form, DO;  Location: [facility];  Service: Plastics;  Laterality: Bilateral;   PORT-A-CATH REMOVAL Right 12/06/2017   Procedure: REMOVAL PORT-A-CATH;  Surgeon: Peggye Form, DO;  Location: [facility];  Service: Plastics;  Laterality: Right;   PORTACATH PLACEMENT Right 06/01/2017   Procedure: INSERTION PORT-A-CATH RIGHT SUBCLAVIAN;  Surgeon: Almond Lint, MD;  [facility];  Service: General;  Laterality: Right;   ROBOTIC ASSISTED SALPINGO OOPHERECTOMY Bilateral 08/31/2018   Procedure: XI ROBOTIC ASSISTED BILATERAL  SALPINGO OOPHORECTOMY;  Surgeon: Adolphus Birchwood, MD;  Location: [facility];  Service:  Gynecology;  Laterality: Bilateral;   WISDOM TOOTH EXTRACTION      There were no vitals filed for this visit.   Subjective Assessment - 02/26/20 0933    Subjective "the last few days i've had some increase in adhesions  in the back which i think is causing my L foot to cause  to go numb"    Diagnostic tests 12/14/2019 MRI IMPRESSION:1. Minimal degenerative disc disease  at T10-11 and T11-12 withoutsignificant stenosis.2. Otherwise unremarkable and normal MRI of the thoracic spine. Noother significant disc pathology, stenosis, or evidence for neuralimpingement.    Currently in Pain? No/denies    Pain Orientation Left    Pain Descriptors / Indicators Aching;Tightness    Pain Type Chronic pain    Pain Onset More than a month ago    Pain Frequency Intermittent    Aggravating Factors  using the UE for long periods of time              [facility] PT Assessment - 02/26/20 0001      Assessment   Medical Diagnosis Paresthesia and pain of left extremity M79.609, R20.2    Referring Provider (PT) Cira Rue NP                         [facility] Adult PT Treatment/Exercise - 02/26/20 0001      Self-Care   Self-Care Other Self-Care Comments    Other Self-Care Comments  self trigger point release for the piriformis using tennis ball       Neck Exercises: Machines for Strengthening   UBE (Upper Arm Bike) L1 x 4 min   fwd/bwd x 2 min     Neck Exercises: Supine   Other Supine Exercise foam roll routine 1 x 10 ea. ceiling punches, horizonal abd/ add, alternating ceiling punches, x to y, back stroke      Manual Therapy   Manual therapy comments skilled palpation and monitoring of pt throughout TPDN    Joint Mobilization T1-T8 PA grade III    Soft tissue mobilization IASTM along the bil upper trap/ levatro scapuale      Neck Exercises: Stretches   Upper Trapezius Stretch Left;30 seconds;Right;1 rep    Other Neck Stretches pec stretch 2 x 30 sec on foam roller            Trigger Point Dry Needling - 02/26/20 0001    Consent Given? Yes    Education Handout Provided Previously provided    Muscles Treated Head and Neck Cervical multifidi    Upper Trapezius Response Twitch reponse elicited;Palpable increased muscle length   R   Cervical multifidi Response Palpable increased muscle length;Twitch reponse elicited   C5 bil    Pectoralis Major Response  Twitch response elicited;Palpable increased muscle length   L                 PT Short Term Goals - 02/05/20 1124      PT SHORT TERM GOAL #1   Title pt to be I with inital HEP    Time 4    Period Weeks    Status New    Target Date 03/04/20             PT Long Term Goals - 02/05/20 1248      PT LONG TERM GOAL #1   Title pt to report </= 1/10 pain in the L shoulder and neck with minimal to no report of stiffness  Period Weeks    Status New    Target Date 04/01/20      PT LONG TERM GOAL #2   Title increase Gross L shoulder strength to >/= 4+/5 to promote stability with lifting/ carrying and ADls with no report of pain or stiffness    Baseline -    Time 8      PT LONG TERM GOAL #3   Title increase FOTO score to </= 31% limited to demo improvement in function    Time 8    Period Weeks    Status New    Target Date 04/01/20      PT LONG TERM GOAL #4   Title pt to be I with all HEP given to maintain and progress current level of function    Baseline -    Time 8    Period Weeks    Status New    Target Date 04/01/20                 Plan - 02/26/20 1000    Clinical Impression Statement pt notes fluctuating stiffness in the L shoulder and pec stiffness. Continued TPDN for the L pec, upper trap and bil cervical multifidi followed with IASTM and mobs. continued scapulothoracic mobility/ stability usin gthe foam roll routine which she responded well to.    PT Treatment/Interventions ADLs/Self Care Home Management;Cryotherapy;Therapeutic activities;Therapeutic exercise;Neuromuscular re-education;Manual techniques;Passive range of motion;Dry needling;Taping;Spinal Manipulations;Patient/family education    PT Next Visit Plan review HEP/ update PRN, response to DN and continue TPDN focus on L scalenes, upper trap and surroudning musculature, funcitonal scapulart stability foam roll routine, provided FOAM roll routine as HEP    PT Home Exercise Plan [id number] - upper  trap stretch, shoulder IR with red band, rib mobs    Consulted and Agree with Plan of Care Patient           Patient will benefit from skilled therapeutic intervention in order to improve the following deficits and impairments:  Improper body mechanics, Increased muscle spasms, Decreased strength, Postural dysfunction, Pain  Visit Diagnosis: Cervicalgia  Other muscle spasm  Chronic left shoulder pain     Problem List Patient Active Problem List   Diagnosis Date Noted   Chemotherapy-induced peripheral neuropathy ([place]) 01/13/2020   Breast asymmetry following reconstructive surgery 11/16/2018   S/P mastectomy, bilateral 07/24/2018   S/P breast reconstruction 07/24/2018   Port-A-Cath in place 08/18/2017   Genetic testing 04/17/2017   BRCA2 gene mutation positive 04/17/2017   Family history of breast cancer    Family history of ovarian cancer    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 [place], [place], [address] Phone: [phone]   Fax:  [phone]  Name: Madison Savage MRN: [id number] Date of Birth: 03/10/1971

## 2020-03-03 ENCOUNTER — Encounter: Payer: Self-pay | Admitting: Physical Therapy

## 2020-03-03 ENCOUNTER — Other Ambulatory Visit: Payer: Self-pay

## 2020-03-03 ENCOUNTER — Ambulatory Visit: Payer: BC Managed Care – PPO | Admitting: Physical Therapy

## 2020-03-03 DIAGNOSIS — M62838 Other muscle spasm: Secondary | ICD-10-CM | POA: Diagnosis not present

## 2020-03-03 DIAGNOSIS — M542 Cervicalgia: Secondary | ICD-10-CM

## 2020-03-03 DIAGNOSIS — G8929 Other chronic pain: Secondary | ICD-10-CM

## 2020-03-03 DIAGNOSIS — M25512 Pain in left shoulder: Secondary | ICD-10-CM | POA: Diagnosis not present

## 2020-03-03 NOTE — Therapy (Signed)
[place], [place], [address] Phone: [phone]   Fax:  [phone]  Physical Therapy Treatment  Patient Details  Name: Madison Savage MRN: [id number] Date of Birth: 04-17-1971 Referring Provider (PT): Cira Rue NP   Encounter Date: 03/03/2020   PT End of Session - 03/03/20 0844    Visit Number 4    Number of Visits 9    Date for PT Re-Evaluation 04/01/20    Authorization Type BCBS    PT Start Time 0844    PT Stop Time 0928    PT Time Calculation (min) 44 min    Activity Tolerance Patient tolerated treatment well    Behavior During Therapy [facility] for tasks assessed/performed           Past Medical History:  Diagnosis Date  . Anemia   . Anxiety   . BRCA2 positive   . Chronic ITP (idiopathic thrombocytopenia) (HCC)   . Family history of breast cancer   . Family history of ovarian cancer   . Headache    last migraine...9/24  . History of cancer chemotherapy    left breast 06-09-2017  to 11-16-2017  . History of cardiac murmur as a child    infant  . History of external beam radiation therapy    left breast   01-03-2018  to 02-19-2018  . MTHFR mutation ([place])   . PONV (postoperative nausea and vomiting)   . 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 Wears contact lenses     Past Surgical History:  Procedure Laterality Date  . AXILLARY LYMPH NODE DISSECTION Left 04/27/2017   Procedure: AXILLARY LYMPH NODE DISSECTION;  Surgeon: [name], MD;  Location: [place];  Service: General;  Laterality: Left;  . BILATERAL TOTAL MASTECTOMY WITH AXILLARY LYMPH NODE  DISSECTION Bilateral 04/27/2017   Procedure: SKIN SPARING MASTECTOMY;  Surgeon: [name], MD;  Location: [place];  Service: General;  Laterality: Bilateral;  . BREAST CAPSULECTOMY WITH IMPLANT EXCHANGE Bilateral 12/06/2017   Procedure: BREAST CAPSULECTOMY WITH IMPLANT EXCHANGE;  Surgeon: Wallace Going, DO;  Location: [place];  Service: Plastics;  Laterality: Bilateral;  . BREAST RECONSTRUCTION WITH PLACEMENT OF TISSUE EXPANDER AND FLEX HD (ACELLULAR HYDRATED DERMIS) Bilateral 04/27/2017   Procedure: BILATERAL BREAST RECONSTRUCTION WITH PLACEMENT OF TISSUE EXPANDER AND FLEX HD (ACELLULAR HYDRATED DERMIS);  Surgeon: Wallace Going, DO;  Location: [place];  Service: Plastics;  Laterality: Bilateral;  . PLACEMENT OF BREAST IMPLANTS Bilateral 12/06/2017   Procedure: PLACEMENT OF BREAST IMPLANTS;  Surgeon: Wallace Going, DO;  Location: [place];  Service: Plastics;  Laterality: Bilateral;  . PORT-A-CATH REMOVAL Right 12/06/2017   Procedure: REMOVAL PORT-A-CATH;  Surgeon: Wallace Going, DO;  Location: [place];  Service: Plastics;  Laterality: Right;  . PORTACATH PLACEMENT Right 06/01/2017   Procedure: INSERTION PORT-A-CATH RIGHT SUBCLAVIAN;  Surgeon: [name], MD;  Location: [place];  Service: General;  Laterality: Right;  . ROBOTIC ASSISTED SALPINGO OOPHERECTOMY Bilateral 08/31/2018   Procedure: XI ROBOTIC ASSISTED BILATERAL  SALPINGO OOPHORECTOMY;  Surgeon: Everitt Amber, MD;  Location: [facility];  Service:  Gynecology;  Laterality: Bilateral;  . WISDOM TOOTH EXTRACTION      There were no vitals filed for this visit.   Subjective Assessment - 03/03/20 0844    Subjective "The last session worked out really well. my pec is feeling more sore today, but neck is much better"    Diagnostic tests 12/14/2019 MRI IMPRESSION:1. Minimal degenerative disc disease at T10-11 and T11-12  withoutsignificant stenosis.2. Otherwise unremarkable and normal MRI of the thoracic spine. Noother significant disc pathology, stenosis, or evidence for neuralimpingement.    Currently in Pain? No/denies    Pain Orientation Left    Pain Onset More than a month ago    Pain Frequency Intermittent              [facility] PT Assessment - 03/03/20 0001      Assessment   Medical Diagnosis Paresthesia and pain of left extremity M79.609, R20.2    Referring Provider (PT) Cira Rue NP                         [facility] Adult PT Treatment/Exercise - 03/03/20 0001      Neck Exercises: Machines for Strengthening   UBE (Upper Arm Bike) L3 x 4 min    fwd/bwd x 2 min     Neck Exercises: Theraband   Horizontal ABduction 15 reps;Green   with foam roll along spine   Other Theraband Exercises pilates circle pushing in 1 x 10 with shoulder flexior, pullling out 1 x 10 with shoulder flexion      Neck Exercises: Supine   Other Supine Exercise foam roll routine 1 x 15 ea. ceiling punches, horizonal abd/ add, alternating ceiling punches, x to y, back stroke      Manual Therapy   Manual therapy comments MTPR along the sub-clavius    Joint Mobilization A/C joint mobs grade III inferior/ posterior      Neck Exercises: Stretches   Upper Trapezius Stretch Left;30 seconds;Right;1 rep    Other Neck Stretches pec stretch 2 x 30 sec on foam roller                  PT Education - 03/03/20 0934    Education Details updated HEP for foam roll routine today.    Person(s) Educated Patient    Methods Explanation;Verbal cues;Handout    Comprehension Verbalized understanding;Verbal cues required            PT Short Term Goals - 03/03/20 0933      PT SHORT TERM GOAL #1   Title pt to be I with inital HEP    Period Weeks    Status Achieved             PT Long Term Goals - 02/05/20 1248      PT LONG TERM GOAL #1   Title pt to report </= 1/10 pain in the L shoulder and neck with  minimal to no report of stiffness    Period Weeks    Status New    Target Date 04/01/20      PT LONG TERM GOAL #2   Title increase Gross L shoulder strength to >/= 4+/5 to promote stability with lifting/ carrying and ADls with no report of pain or stiffness    Baseline -    Time 8      PT LONG TERM GOAL #3   Title increase FOTO score to </= 31% limited to demo improvement in function  Time 8    Period Weeks    Status New    Target Date 04/01/20      PT LONG TERM GOAL #4   Title pt to be I with all HEP given to maintain and progress current level of function    Baseline -    Time 8    Period Weeks    Status New    Target Date 04/01/20                 Plan - 03/03/20 1027    Clinical Impression Statement pt notes relief of neck pain today but continues to report stiffness in the L pec. focused on exercise and stretching to address stiffness. She did well with the foam roll routine and stretching noting relief of tension. She was alittle sore with standing horizontal abduction and pillates ring pushing in. plan to see pt back in 2 weeks to assess response without PT, and reassess next session.    PT Treatment/Interventions ADLs/Self Care Home Management;Cryotherapy;Therapeutic activities;Therapeutic exercise;Neuromuscular re-education;Manual techniques;Passive range of motion;Dry needling;Taping;Spinal Manipulations;Patient/family education    PT Next Visit Plan review HEP/ update PRN, response to DN and continue TPDN focus on L scalenes, upper trap and surroudning musculature, funcitonal scapulart stability foam roll routine, provided FOAM roll routine as HEP    PT Home Exercise Plan [id number] - upper trap stretch, shoulder IR with red band, rib mobs    Consulted and Agree with Plan of Care Patient           Patient will benefit from skilled therapeutic intervention in order to improve the following deficits and impairments:  Improper body mechanics, Increased muscle  spasms, Decreased strength, Postural dysfunction, Pain  Visit Diagnosis: Cervicalgia  Other muscle spasm  Chronic left shoulder pain     Problem List Patient Active Problem List   Diagnosis Date Noted  . Chemotherapy-induced peripheral neuropathy ([place]) 01/13/2020  . Breast asymmetry following reconstructive surgery 11/16/2018  . S/P mastectomy, bilateral 07/24/2018  . S/P breast reconstruction 07/24/2018  . Port-A-Cath in place 08/18/2017  . Genetic testing 04/17/2017  . BRCA2 gene mutation positive 04/17/2017  . Family history of breast cancer   . Family history of ovarian cancer   . 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 Cough 03/10/2014  . Esophagitis 03/10/2014  . Chronic ITP (idiopathic thrombocytopenia) (HCC) 03/10/2014  . ALLERGIC REACTION, ACUTE 07/22/2008   Starr Lake PT, DPT, LAT, ATC  03/03/20  9:34 AM      [facility] [address] [place], [place], [address] Phone: [phone]   Fax:  [phone]  Name: Madison Savage MRN: [id number] Date of Birth: [birth year]

## 2020-03-04 ENCOUNTER — Other Ambulatory Visit: Payer: Self-pay | Admitting: Hematology

## 2020-03-17 ENCOUNTER — Other Ambulatory Visit: Payer: Self-pay

## 2020-03-17 ENCOUNTER — Encounter: Payer: Self-pay | Admitting: Physical Therapy

## 2020-03-17 ENCOUNTER — Ambulatory Visit: Payer: BC Managed Care – PPO | Attending: Nurse Practitioner | Admitting: Physical Therapy

## 2020-03-17 DIAGNOSIS — R293 Abnormal posture: Secondary | ICD-10-CM | POA: Insufficient documentation

## 2020-03-17 DIAGNOSIS — M25512 Pain in left shoulder: Secondary | ICD-10-CM | POA: Insufficient documentation

## 2020-03-17 DIAGNOSIS — M62838 Other muscle spasm: Secondary | ICD-10-CM | POA: Diagnosis not present

## 2020-03-17 DIAGNOSIS — M542 Cervicalgia: Secondary | ICD-10-CM | POA: Insufficient documentation

## 2020-03-17 DIAGNOSIS — M25612 Stiffness of left shoulder, not elsewhere classified: Secondary | ICD-10-CM | POA: Diagnosis not present

## 2020-03-17 DIAGNOSIS — G8929 Other chronic pain: Secondary | ICD-10-CM | POA: Insufficient documentation

## 2020-03-17 NOTE — Therapy (Signed)
[facility] [address] [place], [place], [address] Phone: [phone]   Fax:  [phone]  Physical Therapy Treatment  Patient Details  Name: Madison Savage MRN: [id number] Date of Birth: 02/21/1971 Referring Provider (PT): Lacie Burton NP   Encounter Date: 03/17/2020   PT End of Session - 03/17/20 0932    Visit Number 5    Number of Visits 9    Date for PT Re-Evaluation 04/01/20    Authorization Type BCBS    PT Start Time 0931    PT Stop Time 1015    PT Time Calculation (min) 44 min    Activity Tolerance Patient tolerated treatment well    Behavior During Therapy WFL for tasks assessed/performed           Past Medical History:  Diagnosis Date  . Anemia   . Anxiety   . BRCA2 positive   . Chronic ITP (idiopathic thrombocytopenia) (HCC)   . Family history of breast cancer   . Family history of ovarian cancer   . Headache    last migraine...9/24  . History of cancer chemotherapy    left breast 06-09-2017  to 11-16-2017  . History of cardiac murmur as a child    infant  . History of external beam radiation therapy    left breast   01-03-2018  to 02-19-2018  . MTHFR mutation (HCC)   . PONV (postoperative nausea and vomiting)   . 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 Wears contact lenses     Past Surgical History:  Procedure Laterality Date  . AXILLARY LYMPH NODE DISSECTION Left 04/27/2017   Procedure: AXILLARY LYMPH NODE DISSECTION;  Surgeon: Byerly, Faera, MD;  Location: [facility];  Service: General;  Laterality: Left;  . BILATERAL TOTAL MASTECTOMY WITH AXILLARY LYMPH NODE  DISSECTION Bilateral 04/27/2017   Procedure: SKIN SPARING MASTECTOMY;  Surgeon: Byerly, Faera, MD;  Location: [facility];  Service: General;  Laterality: Bilateral;  . BREAST CAPSULECTOMY WITH IMPLANT EXCHANGE Bilateral 12/06/2017   Procedure: BREAST CAPSULECTOMY WITH IMPLANT EXCHANGE;  Surgeon: Dillingham, Claire S, DO;  Location: [facility];  Service: Plastics;  Laterality: Bilateral;  . BREAST RECONSTRUCTION WITH PLACEMENT OF TISSUE EXPANDER AND FLEX HD (ACELLULAR HYDRATED DERMIS) Bilateral 04/27/2017   Procedure: BILATERAL BREAST RECONSTRUCTION WITH PLACEMENT OF TISSUE EXPANDER AND FLEX HD (ACELLULAR HYDRATED DERMIS);  Surgeon: Dillingham, Claire S, DO;  Location: [facility];  Service: Plastics;  Laterality: Bilateral;  . PLACEMENT OF BREAST IMPLANTS Bilateral 12/06/2017   Procedure: PLACEMENT OF BREAST IMPLANTS;  Surgeon: Dillingham, Claire S, DO;  Location: [facility];  Service: Plastics;  Laterality: Bilateral;  . PORT-A-CATH REMOVAL Right 12/06/2017   Procedure: REMOVAL PORT-A-CATH;  Surgeon: Dillingham, Claire S, DO;  Location:  [facility];  Service: Plastics;  Laterality: Right;  . PORTACATH PLACEMENT Right 06/01/2017   Procedure: INSERTION PORT-A-CATH RIGHT SUBCLAVIAN;  Surgeon: Byerly, Faera, MD;  Location: [facility];  Service: General;  Laterality: Right;  . ROBOTIC ASSISTED SALPINGO OOPHERECTOMY Bilateral 08/31/2018   Procedure: XI ROBOTIC ASSISTED BILATERAL  SALPINGO OOPHORECTOMY;  Surgeon: Rossi, Emma, MD;  Location: [facility];  Service:   Gynecology;  Laterality: Bilateral;  . WISDOM TOOTH EXTRACTION      There were no vitals filed for this visit.   Subjective Assessment - 03/17/20 0932    Subjective "I've been doing pretty good. I still have some shoulder issues it still feels like it gets stuck. Overall it is way better than it was, I don't have as much numbness in my face which I have been doing the  epsom salt baths and it helps along with stretches."              [facility] PT Assessment - 03/17/20 0001      Assessment   Medical Diagnosis Paresthesia and pain of left extremity M79.609, R20.2    Referring Provider (PT) Cira Rue NP                         [facility] Adult PT Treatment/Exercise - 03/17/20 0001      Neck Exercises: Machines for Strengthening   UBE (Upper Arm Bike) L3 x 4 min    fwd/bwd x 2 min     Shoulder Exercises: Stretch   Other Shoulder Stretches 3-way bicep stretch LUE only      Manual Therapy   Manual therapy comments skilled palpation and monitoring of pt throughout TPDN    Soft tissue mobilization IASTM along the bicep brachii, pec major and latissumus dorsi      Neck Exercises: Stretches   Upper Trapezius Stretch Left;30 seconds;Right;1 rep            Trigger Point Dry Needling - 03/17/20 0001    Consent Given? Yes    Education Handout Provided Previously provided    Muscles Treated Upper Quadrant Biceps;Latissimus dorsi    Pectoralis Major Response Twitch response elicited;Palpable increased muscle length   L   Latissimus dorsi Response --   L   Biceps Response Twitch response elicited;Palpable increased muscle length   L               PT Education - 03/17/20 1015    Education Details Reviewed HEP and updated for 3 way bicep stretch. and discussed POC moving forward trying out  no PT for 1-2 weeks then performed a re-evalution to assess progress.    Person(s) Educated Patient    Methods Explanation;Verbal cues    Comprehension Verbalized understanding;Verbal cues required            PT Short Term Goals - 03/03/20 0933      PT SHORT TERM GOAL #1   Title pt to be I with inital HEP    Period Weeks    Status Achieved             PT Long Term Goals - 02/05/20 1248      PT LONG TERM GOAL #1   Title pt to report </= 1/10 pain in the L shoulder and neck with minimal to no report of stiffness    Period Weeks     Status New    Target Date 04/01/20      PT LONG TERM GOAL #2   Title increase Gross L shoulder strength to >/= 4+/5 to promote stability with lifting/ carrying and ADls with no report of pain or stiffness    Baseline -    Time 8      PT LONG TERM GOAL #3   Title increase FOTO score to </= 31% limited to demo improvement in function    Time 8  Period Weeks    Status New    Target Date 04/01/20      PT LONG TERM GOAL #4   Title pt to be I with all HEP given to maintain and progress current level of function    Baseline -    Time 8    Period Weeks    Status New    Target Date 04/01/20                 Plan - 03/17/20 1018    Clinical Impression Statement pt rpeorts she is doing well but does contiues to have fluctuating soreness inthe L shoulder and surroudning musculature. continued TPDN along the biceps brachii, and pec major followdw ith IASTM techniues and stretching. she did not having increased stiffness end of session likely from DN. discussed with pt that given her PHMx and treatment she may fluctuate in stiffness especially on the L side which she noted understanding. plan to see pt back for one more visit and reassess if more PT is needed.    PT Treatment/Interventions ADLs/Self Care Home Management;Cryotherapy;Therapeutic activities;Therapeutic exercise;Neuromuscular re-education;Manual techniques;Passive range of motion;Dry needling;Taping;Spinal Manipulations;Patient/family education    PT Next Visit Plan ERO, FOTO ,  response to DN and continue TPDN focus on L scalenes, upper trap and surroudning musculature, funcitonal scapulart stability foam roll routine,    PT Home Exercise Plan [id number] - upper trap stretch, shoulder IR with red band, rib mobs, bicep stretch, Foam roll routine    Consulted and Agree with Plan of Care Patient           Patient will benefit from skilled therapeutic intervention in order to improve the following deficits and impairments:   Improper body mechanics, Increased muscle spasms, Decreased strength, Postural dysfunction, Pain  Visit Diagnosis: Cervicalgia  Other muscle spasm  Chronic left shoulder pain     Problem List Patient Active Problem List   Diagnosis Date Noted  . Chemotherapy-induced peripheral neuropathy (HCC) 01/13/2020  . Breast asymmetry following reconstructive surgery 11/16/2018  . S/P mastectomy, bilateral 07/24/2018  . S/P breast reconstruction 07/24/2018  . Port-A-Cath in place 08/18/2017  . Genetic testing 04/17/2017  . BRCA2 gene mutation positive 04/17/2017  . Family history of breast cancer   . Family history of ovarian cancer   . 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 Cough 03/10/2014  . Esophagitis 03/10/2014  . Chronic ITP (idiopathic thrombocytopenia) (HCC) 03/10/2014  . ALLERGIC REACTION, ACUTE 07/22/2008    Kristoffer Leamon PT, DPT, LAT, ATC  03/17/20  11:47 AM      [facility] [address] [place], [place], [address] Phone: [phone]   Fax:  [phone]  Name: Madison Savage MRN: [id number] Date of Birth: 06/14/1971   

## 2020-04-01 ENCOUNTER — Encounter: Payer: Self-pay | Admitting: Physical Therapy

## 2020-04-01 ENCOUNTER — Ambulatory Visit: Payer: BC Managed Care – PPO | Admitting: Physical Therapy

## 2020-04-01 ENCOUNTER — Other Ambulatory Visit: Payer: Self-pay

## 2020-04-01 DIAGNOSIS — G8929 Other chronic pain: Secondary | ICD-10-CM

## 2020-04-01 DIAGNOSIS — M25512 Pain in left shoulder: Secondary | ICD-10-CM

## 2020-04-01 DIAGNOSIS — M542 Cervicalgia: Secondary | ICD-10-CM

## 2020-04-01 DIAGNOSIS — M25612 Stiffness of left shoulder, not elsewhere classified: Secondary | ICD-10-CM | POA: Diagnosis not present

## 2020-04-01 DIAGNOSIS — R293 Abnormal posture: Secondary | ICD-10-CM

## 2020-04-01 DIAGNOSIS — M62838 Other muscle spasm: Secondary | ICD-10-CM

## 2020-04-01 NOTE — Therapy (Signed)
[place], [place], [address] Phone: [phone]   Fax:  [phone]  Physical Therapy Treatment / Re-certification   Patient Details  Name: YATZIRI WAINWRIGHT MRN: [id number] Date of Birth: 17-Dec-1970 Referring Provider (PT): Cira Rue NP   Encounter Date: 04/01/2020   PT End of Session - 04/01/20 0932    Visit Number 6    Number of Visits 9    Date for PT Re-Evaluation 04/22/20    Authorization Type BCBS    PT Start Time 0931    PT Stop Time 1013    PT Time Calculation (min) 42 min    Activity Tolerance Patient tolerated treatment well    Behavior During Therapy [facility] for tasks assessed/performed           Past Medical History:  Diagnosis Date  . Anemia   . Anxiety   . BRCA2 positive   . Chronic ITP (idiopathic thrombocytopenia) (HCC)   . Family history of breast cancer   . Family history of ovarian cancer   . Headache    last migraine...9/24  . History of cancer chemotherapy    left breast 06-09-2017  to 11-16-2017  . History of cardiac murmur as a child    infant  . History of external beam radiation therapy    left breast   01-03-2018  to 02-19-2018  . MTHFR mutation ([place])   . PONV (postoperative nausea and vomiting)   . 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 Wears contact lenses     Past Surgical History:  Procedure Laterality Date  . AXILLARY LYMPH NODE DISSECTION Left 04/27/2017   Procedure: AXILLARY LYMPH NODE DISSECTION;  Surgeon: [name], MD;  Location: [place];  Service: General;  Laterality: Left;  . BILATERAL TOTAL MASTECTOMY WITH  AXILLARY LYMPH NODE DISSECTION Bilateral 04/27/2017   Procedure: SKIN SPARING MASTECTOMY;  Surgeon: [name], MD;  Location: [place];  Service: General;  Laterality: Bilateral;  . BREAST CAPSULECTOMY WITH IMPLANT EXCHANGE Bilateral 12/06/2017   Procedure: BREAST CAPSULECTOMY WITH IMPLANT EXCHANGE;  Surgeon: Wallace Going, DO;  Location: [place];  Service: Plastics;  Laterality: Bilateral;  . BREAST RECONSTRUCTION WITH PLACEMENT OF TISSUE EXPANDER AND FLEX HD (ACELLULAR HYDRATED DERMIS) Bilateral 04/27/2017   Procedure: BILATERAL BREAST RECONSTRUCTION WITH PLACEMENT OF TISSUE EXPANDER AND FLEX HD (ACELLULAR HYDRATED DERMIS);  Surgeon: Wallace Going, DO;  Location: [place] [facility];  Service: Plastics;  Laterality: Bilateral;  . PLACEMENT OF BREAST IMPLANTS Bilateral 12/06/2017   Procedure: PLACEMENT OF BREAST IMPLANTS;  Surgeon: Wallace Going, DO;  Location: [place];  Service: Plastics;  Laterality: Bilateral;  . PORT-A-CATH REMOVAL Right 12/06/2017   Procedure: REMOVAL PORT-A-CATH;  Surgeon: Wallace Going, DO;  Location: [facility];  Service: Plastics;  Laterality: Right;  . PORTACATH PLACEMENT Right 06/01/2017   Procedure: INSERTION PORT-A-CATH RIGHT SUBCLAVIAN;  Surgeon: [name], MD;  Location: [place];  Service: General;  Laterality: Right;  . ROBOTIC ASSISTED SALPINGO OOPHERECTOMY Bilateral 08/31/2018   Procedure: XI ROBOTIC ASSISTED BILATERAL  SALPINGO OOPHORECTOMY;  Surgeon: Everitt Amber, MD;  Location: [facility];  Service: Gynecology;  Laterality: Bilateral;  . WISDOM TOOTH EXTRACTION      There were no vitals filed for this visit.   Subjective Assessment - 04/01/20 0932    Subjective "Last week was not good, my neck was very tight and it caused my HA. I still have alittle bit of a head ache today but its more of a dull ache. Today my L shoulder is pretty sore at about  a 7/10, especially when I reach backward."    Diagnostic tests 12/14/2019 MRI IMPRESSION:1. Minimal degenerative disc disease at T10-11 and T11-12 withoutsignificant stenosis.2. Otherwise unremarkable and normal MRI of the thoracic spine. Noother significant disc pathology, stenosis, or evidence for neuralimpingement.    Currently in Pain? Yes    Pain Score 7     Pain Location Shoulder    Pain Orientation Left    Pain Descriptors / Indicators Aching    Pain Type Chronic pain    Pain Onset More than a month ago    Pain Frequency Intermittent    Aggravating Factors  reaching back with the LUE    Pain Relieving Factors heating pack.              [facility] PT Assessment - 04/01/20 0001      Assessment   Medical Diagnosis Paresthesia and pain of left extremity M79.609, R20.2      Observation/Other Assessments   Focus on Therapeutic Outcomes (FOTO)  52% limited      AROM   AROM Assessment Site Shoulder    Right/Left Shoulder Left    Left Shoulder Extension 62 Degrees    Left Shoulder Flexion 162 Degrees    Left Shoulder ABduction 158 Degrees    Left Shoulder Internal Rotation --   T4   Left Shoulder External Rotation --   T5   Cervical Flexion 55    Cervical Extension 55    Cervical - Right Side Bend 40    Cervical - Left Side Bend 40    Cervical - Right Rotation 73    Cervical - Left Rotation 64      Strength   Right Shoulder Flexion 4/5    Right Shoulder Extension 4+/5    Right Shoulder ABduction 4-/5    Right Shoulder Internal Rotation 4-/5    Right Shoulder External Rotation 4-/5    Left Shoulder Flexion 4/5    Left Shoulder Extension 4+/5    Left Shoulder ABduction 4-/5    Left Shoulder Internal Rotation 4-/5    Left Shoulder External Rotation 4-/5                         [facility] Adult PT Treatment/Exercise - 04/01/20 0001      Manual Therapy   Manual therapy comments skilled palpation and monitoring of pt throughout TPDN    Soft tissue mobilization  IASTM along the upper trap/ levator scapulae      Neck Exercises: Stretches   Upper Trapezius Stretch Left;30 seconds;Right;1 rep    Levator Stretch 20 seconds;1 rep;Left;Right            Trigger Point Dry Needling - 04/01/20 0001    Consent Given? Yes    Education Handout Provided Previously provided    Muscles Treated Head and Neck Levator scapulae    Upper Trapezius Response Twitch reponse elicited;Palpable increased muscle length    Levator Scapulae Response Twitch response elicited;Palpable increased muscle length  PT Education - 04/01/20 1052    Education Details reviewed HEP and udpated today to add comprehensive shoulder HEP. reviewed posture and benefits of consistency with stretch. FOTO reassessment and updated POC.    Person(s) Educated Patient    Methods Explanation;Verbal cues;Handout    Comprehension Verbalized understanding;Verbal cues required            PT Short Term Goals - 03/03/20 0933      PT SHORT TERM GOAL #1   Title pt to be I with inital HEP    Period Weeks    Status Achieved             PT Long Term Goals - 04/01/20 4081      PT LONG TERM GOAL #1   Title pt to report </= 1/10 pain in the L shoulder and neck with minimal to no report of stiffness    Period Weeks    Status On-going      PT LONG TERM GOAL #2   Title increase Gross L shoulder strength to >/= 4+/5 to promote stability with lifting/ carrying and ADls with no report of pain or stiffness    Period Weeks    Status Partially Met      PT LONG TERM GOAL #3   Title increase FOTO score to </= 31% limited to demo improvement in function    Period Weeks    Status On-going      PT LONG TERM GOAL #4   Title pt to be I with all HEP given to maintain and progress current level of function    Period Weeks                 Plan - 04/01/20 1005    Clinical Impression Statement pt arrives to PT noting increased soreness in the neck/ shoulders and increased  HA which appears to be from increased tension. continued TPDN focusing on bil upper trap/ levator scapuale followed with STW. she is making some progress toward her goals and would benefit from continued physical therapy to decrease neck/ shoudler pain, reduce muscle tension and maximize overall funciton by addressing the deficits listed.    Rehab Potential Good    PT Frequency 1x / week    PT Duration 3 weeks    PT Treatment/Interventions ADLs/Self Care Home Management;Cryotherapy;Therapeutic activities;Therapeutic exercise;Neuromuscular re-education;Manual techniques;Passive range of motion;Dry needling;Taping;Spinal Manipulations;Patient/family education    PT Next Visit Plan response to DN and continue TPDN focus on L scalenes, upper trap and surroudning musculature, scapular stretching routine    PT Home Exercise Plan [id number] - upper trap stretch, shoulder IR with red band, rib mobs, bicep stretch, Foam roll routine, rows, shoulder ER, money, horiztonal abduction    Consulted and Agree with Plan of Care Patient           Patient will benefit from skilled therapeutic intervention in order to improve the following deficits and impairments:  Improper body mechanics, Increased muscle spasms, Decreased strength, Postural dysfunction, Pain  Visit Diagnosis: Cervicalgia  Other muscle spasm  Chronic left shoulder pain  Stiffness of left shoulder, not elsewhere classified  Acute pain of left shoulder  Abnormal posture     Problem List Patient Active Problem List   Diagnosis Date Noted  . Chemotherapy-induced peripheral neuropathy ([place]) 01/13/2020  . Breast asymmetry following reconstructive surgery 11/16/2018  . S/P mastectomy, bilateral 07/24/2018  . S/P breast reconstruction 07/24/2018  . Port-A-Cath in place 08/18/2017  . Genetic testing 04/17/2017  . BRCA2  gene mutation positive 04/17/2017  . Family history of breast cancer   . Family history of ovarian cancer   . Primary  malignant neoplasm of upper outer quadrant of left breast ([place]) 03/29/2017  . Cough 03/10/2014  . Esophagitis 03/10/2014  . Chronic ITP (idiopathic thrombocytopenia) (HCC) 03/10/2014  . ALLERGIC REACTION, ACUTE 07/22/2008    Starr Lake PT, DPT, LAT, ATC  04/01/20  10:58 AM      [facility] [address] [place], [place], [address] Phone: [phone]   Fax:  [phone]  Name: MARYJANE BENEDICT MRN: [id number] Date of Birth: November 03, 1970

## 2020-04-10 ENCOUNTER — Other Ambulatory Visit: Payer: BC Managed Care – PPO

## 2020-04-16 ENCOUNTER — Ambulatory Visit: Payer: BC Managed Care – PPO | Attending: Nurse Practitioner | Admitting: Physical Therapy

## 2020-04-16 ENCOUNTER — Other Ambulatory Visit: Payer: Self-pay

## 2020-04-16 ENCOUNTER — Encounter: Payer: Self-pay | Admitting: Physical Therapy

## 2020-04-16 DIAGNOSIS — M62838 Other muscle spasm: Secondary | ICD-10-CM | POA: Insufficient documentation

## 2020-04-16 DIAGNOSIS — M25612 Stiffness of left shoulder, not elsewhere classified: Secondary | ICD-10-CM | POA: Diagnosis not present

## 2020-04-16 DIAGNOSIS — G8929 Other chronic pain: Secondary | ICD-10-CM | POA: Diagnosis not present

## 2020-04-16 DIAGNOSIS — M542 Cervicalgia: Secondary | ICD-10-CM | POA: Diagnosis not present

## 2020-04-16 DIAGNOSIS — M25512 Pain in left shoulder: Secondary | ICD-10-CM | POA: Diagnosis not present

## 2020-04-16 NOTE — Therapy (Signed)
[place], [place], [address] Phone: [phone]   Fax:  [phone]  Physical Therapy Treatment  Patient Details  Name: Madison Savage MRN: [id number] Date of Birth: 09/20/1970 Referring Provider (PT): Cira Rue NP   Encounter Date: 04/16/2020   PT End of Session - 04/16/20 0801    Visit Number 7    Number of Visits 9    Date for PT Re-Evaluation 04/22/20    Authorization Type BCBS    PT Start Time 0800    PT Stop Time 0841    PT Time Calculation (min) 41 min           Past Medical History:  Diagnosis Date  . Anemia   . Anxiety   . BRCA2 positive   . Chronic ITP (idiopathic thrombocytopenia) (HCC)   . Family history of breast cancer   . Family history of ovarian cancer   . Headache    last migraine...9/24  . History of cancer chemotherapy    left breast 06-09-2017  to 11-16-2017  . History of cardiac murmur as a child    infant  . History of external beam radiation therapy    left breast   01-03-2018  to 02-19-2018  . MTHFR mutation ([place])   . PONV (postoperative nausea and vomiting)   . 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 Wears contact lenses     Past Surgical History:  Procedure Laterality Date  . AXILLARY LYMPH NODE DISSECTION Left 04/27/2017   Procedure: AXILLARY LYMPH NODE DISSECTION;  Surgeon: [name], MD;  Location: [place];  Service: General;  Laterality: Left;  . BILATERAL TOTAL MASTECTOMY WITH AXILLARY LYMPH NODE DISSECTION Bilateral 04/27/2017   Procedure: SKIN SPARING MASTECTOMY;  Surgeon: [name], MD;  Location: [place];  Service: General;  Laterality: Bilateral;  . BREAST CAPSULECTOMY WITH IMPLANT EXCHANGE Bilateral 12/06/2017   Procedure: BREAST CAPSULECTOMY WITH IMPLANT EXCHANGE;  Surgeon: Wallace Going, DO;  Location: [place];  Service: Plastics;  Laterality: Bilateral;  . BREAST RECONSTRUCTION WITH PLACEMENT OF TISSUE EXPANDER AND FLEX HD (ACELLULAR HYDRATED DERMIS) Bilateral 04/27/2017   Procedure: BILATERAL BREAST RECONSTRUCTION WITH PLACEMENT OF TISSUE EXPANDER AND FLEX HD (ACELLULAR HYDRATED DERMIS);  Surgeon: Wallace Going, DO;  Location: [place];  Service: Plastics;  Laterality: Bilateral;  . PLACEMENT OF BREAST IMPLANTS Bilateral 12/06/2017   Procedure: PLACEMENT OF BREAST IMPLANTS;  Surgeon: Wallace Going, DO;  Location: [place];  Service: Plastics;  Laterality: Bilateral;  . PORT-A-CATH REMOVAL Right 12/06/2017   Procedure: REMOVAL PORT-A-CATH;  Surgeon: Wallace Going, DO;  Location: [place];  Service: Plastics;  Laterality: Right;  . PORTACATH PLACEMENT Right 06/01/2017   Procedure: INSERTION PORT-A-CATH RIGHT SUBCLAVIAN;  Surgeon: [name], MD;  Location: [place];  Service: General;  Laterality: Right;  . ROBOTIC ASSISTED SALPINGO OOPHERECTOMY Bilateral 08/31/2018   Procedure: XI ROBOTIC ASSISTED BILATERAL  SALPINGO OOPHORECTOMY;  Surgeon: Everitt Amber, MD;  Location: [facility];  Service: Gynecology;  Laterality: Bilateral;  . WISDOM TOOTH EXTRACTION      There were no vitals filed  for this visit.   Subjective Assessment - 04/16/20 0801    Subjective "This week has been okay, last week it was touch and go. 2 days ago my shoulder felt funky.    Diagnostic tests 12/14/2019 MRI IMPRESSION:1. Minimal degenerative disc disease at T10-11 and T11-12 withoutsignificant stenosis.2. Otherwise unremarkable and normal MRI of the thoracic spine. Noother significant disc pathology,  stenosis, or evidence for neuralimpingement.    Patient Stated Goals to decrease the pain,    Currently in Pain? Yes    Pain Score 0-No pain    Pain Location Shoulder    Pain Orientation Left    Pain Descriptors / Indicators Tightness    Pain Onset More than a month ago    Pain Frequency Intermittent    Aggravating Factors  reaching behind the back    Pain Relieving Factors heating pack              [facility] PT Assessment - 04/16/20 0001      Assessment   Medical Diagnosis Paresthesia and pain of left extremity M79.609, R20.2    Referring Provider (PT) Cira Rue NP                         [facility] Adult PT Treatment/Exercise - 04/16/20 0001      Neck Exercises: Machines for Strengthening   UBE (Upper Arm Bike) L3 x 4 min       Shoulder Exercises: Supine   Protraction Strengthening;20 reps   with purtubations, rhythmic stabilization     Shoulder Exercises: Standing   Other Standing Exercises bicep curl 1 x 20 with yellow band    Other Standing Exercises lower trap strengtheng 2 x 15 with yellow band with elbows on walll       Manual Therapy   Manual therapy comments skilled palpation and monitoring of pt throughout TPDN    Soft tissue mobilization IASTM along the upper trap/ levator scapulae      Neck Exercises: Stretches   Upper Trapezius Stretch Left;30 seconds;Right;1 rep    Chest Stretch 2 reps;30 seconds    Other Neck Stretches rhomoboid streth 2x 30 seconds with arms cross            Trigger Point Dry Needling - 04/16/20 0001    Consent Given? Yes    Education Handout Provided Previously provided    Muscles Treated Upper Quadrant Subscapularis    Upper Trapezius Response Twitch reponse elicited;Palpable increased muscle length    Levator Scapulae Response Twitch response elicited;Palpable increased muscle length   L   Pectoralis Major Response Twitch response elicited;Palpable increased muscle length    Biceps Response Twitch response  elicited;Palpable increased muscle length   L   Subclavius Response Palpable increased muscle length;Twitch response elicited                  PT Short Term Goals - 03/03/20 0933      PT SHORT TERM GOAL #1   Title pt to be I with inital HEP    Period Weeks    Status Achieved             PT Long Term Goals - 04/01/20 5956      PT LONG TERM GOAL #1   Title pt to report </= 1/10 pain in the L shoulder and neck with minimal to no report of stiffness    Period Weeks    Status On-going      PT  LONG TERM GOAL #2   Title increase Gross L shoulder strength to >/= 4+/5 to promote stability with lifting/ carrying and ADls with no report of pain or stiffness    Period Weeks    Status Partially Met      PT LONG TERM GOAL #3   Title increase FOTO score to </= 31% limited to demo improvement in function    Period Weeks    Status On-going      PT LONG TERM GOAL #4   Title pt to be I with all HEP given to maintain and progress current level of function    Period Weeks                 Plan - 04/16/20 0063    Clinical Impression Statement continued TPDN in posterior shoulder and bicep/ pec major followed with IASTM. continued working on shoulder strengthening with emphasis on scapulohumeral rhythm to reduce potential long head bicep impingement. she noted feeling sore following session likely from the DN.    PT Treatment/Interventions ADLs/Self Care Home Management;Cryotherapy;Therapeutic activities;Therapeutic exercise;Neuromuscular re-education;Manual techniques;Passive range of motion;Dry needling;Taping;Spinal Manipulations;Patient/family education    PT Next Visit Plan response to DN and continue TPDN focus on L scalenes, upper trap and surroudning musculature, scapular stretching routine, scapulohumeral rhythm.    PT Home Exercise Plan [id number] - upper trap stretch, shoulder IR with red band, rib mobs, bicep stretch, Foam roll routine, rows, shoulder ER, money,  horiztonal abduction    Consulted and Agree with Plan of Care Patient           Patient will benefit from skilled therapeutic intervention in order to improve the following deficits and impairments:     Visit Diagnosis: Cervicalgia  Other muscle spasm  Chronic left shoulder pain  Stiffness of left shoulder, not elsewhere classified     Problem List Patient Active Problem List   Diagnosis Date Noted  . Chemotherapy-induced peripheral neuropathy ([place]) 01/13/2020  . Breast asymmetry following reconstructive surgery 11/16/2018  . S/P mastectomy, bilateral 07/24/2018  . S/P breast reconstruction 07/24/2018  . Port-A-Cath in place 08/18/2017  . Genetic testing 04/17/2017  . BRCA2 gene mutation positive 04/17/2017  . Family history of breast cancer   . Family history of ovarian cancer   . 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 Cough 03/10/2014  . Esophagitis 03/10/2014  . Chronic ITP (idiopathic thrombocytopenia) (HCC) 03/10/2014  . ALLERGIC REACTION, ACUTE 07/22/2008   Starr Lake PT, DPT, LAT, ATC  04/16/20  8:44 AM      [facility] [address] [place], [place], [address] Phone: [phone]   Fax:  [phone]  Name: NAILANI FULL MRN: [id number] Date of Birth: 11/05/1970

## 2020-04-20 ENCOUNTER — Ambulatory Visit: Payer: BC Managed Care – PPO | Admitting: Physical Therapy

## 2020-04-20 ENCOUNTER — Encounter: Payer: Self-pay | Admitting: Physical Therapy

## 2020-04-20 ENCOUNTER — Other Ambulatory Visit: Payer: Self-pay

## 2020-04-20 DIAGNOSIS — M62838 Other muscle spasm: Secondary | ICD-10-CM

## 2020-04-20 DIAGNOSIS — G8929 Other chronic pain: Secondary | ICD-10-CM | POA: Diagnosis not present

## 2020-04-20 DIAGNOSIS — M542 Cervicalgia: Secondary | ICD-10-CM | POA: Diagnosis not present

## 2020-04-20 DIAGNOSIS — M25612 Stiffness of left shoulder, not elsewhere classified: Secondary | ICD-10-CM | POA: Diagnosis not present

## 2020-04-20 DIAGNOSIS — M25512 Pain in left shoulder: Secondary | ICD-10-CM | POA: Diagnosis not present

## 2020-04-20 NOTE — Therapy (Signed)
[facility] [address] [place], [place], [address] Phone: [phone]   Fax:  [phone]  Physical Therapy Treatment  Patient Details  Name: Madison Savage MRN: [id number] Date of Birth: [birth year] Referring Provider (PT): Santiago Glad NP   Encounter Date: 04/20/2020   PT End of Session - 04/20/20 0928    Visit Number 8    Number of Visits 9    Date for PT Re-Evaluation 04/22/20    Authorization Type BCBS    PT Start Time 0928    PT Stop Time 1010    PT Time Calculation (min) 42 min    Activity Tolerance Patient tolerated treatment well    Behavior During Therapy [facility] for tasks assessed/performed           Past Medical History:  Diagnosis Date   Anemia    Anxiety    BRCA2 positive    Chronic ITP (idiopathic thrombocytopenia) (HCC)    Family history of breast cancer    Family history of ovarian cancer    Headache    last migraine...9/24   History of cancer chemotherapy    left breast 06-09-2017  to 11-16-2017   History of cardiac murmur as a child    infant   History of external beam radiation therapy    left breast   01-03-2018  to 02-19-2018   MTHFR mutation (HCC)    PONV (postoperative nausea and vomiting)    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 [facility]) oncologist-- dr Mosetta Putt   dx 03-22-2017--- 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/  04-27-2018  s/p  bilateral mastectomy with left sln dissection's with reconstruction,  completed chemothearpy 11-16-2017,  completed radiation 02-19-2018,  started tamoxifen 07/ 2019   Wears contact lenses     Past Surgical History:  Procedure Laterality Date   AXILLARY LYMPH NODE DISSECTION Left 04/27/2017   Procedure: AXILLARY LYMPH NODE DISSECTION;  Surgeon: Almond Lint, MD;  Location: [facility];  Service: General;  Laterality: Left;   BILATERAL TOTAL MASTECTOMY WITH AXILLARY LYMPH NODE  DISSECTION Bilateral 04/27/2017   Procedure: SKIN SPARING MASTECTOMY;  Surgeon: Almond Lint, MD;  Location: [facility];  Service: General;  Laterality: Bilateral;   BREAST CAPSULECTOMY WITH IMPLANT EXCHANGE Bilateral 12/06/2017   Procedure: BREAST CAPSULECTOMY WITH IMPLANT EXCHANGE;  Surgeon: Peggye Form, DO;  Location: [facility];  Service: Plastics;  Laterality: Bilateral;   BREAST RECONSTRUCTION WITH PLACEMENT OF TISSUE EXPANDER AND FLEX HD (ACELLULAR HYDRATED DERMIS) Bilateral 04/27/2017   Procedure: BILATERAL BREAST RECONSTRUCTION WITH PLACEMENT OF TISSUE EXPANDER AND FLEX HD (ACELLULAR HYDRATED DERMIS);  Surgeon: Peggye Form, DO;  Location: [facility];  Service: Plastics;  Laterality: Bilateral;   PLACEMENT OF BREAST IMPLANTS Bilateral 12/06/2017   Procedure: PLACEMENT OF BREAST IMPLANTS;  Surgeon: Peggye Form, DO;  Location: [facility];  Service: Plastics;  Laterality: Bilateral;   PORT-A-CATH REMOVAL Right 12/06/2017   Procedure: REMOVAL PORT-A-CATH;  Surgeon: Peggye Form, DO;  [facility];  Service: Plastics;  Laterality: Right;   PORTACATH PLACEMENT Right 06/01/2017   Procedure: INSERTION PORT-A-CATH RIGHT SUBCLAVIAN;  Surgeon: Almond Lint, MD;  [facility];  Service: General;  Laterality: Right;   ROBOTIC ASSISTED SALPINGO OOPHERECTOMY Bilateral 08/31/2018   Procedure: XI ROBOTIC ASSISTED BILATERAL  SALPINGO OOPHORECTOMY;  Surgeon: Adolphus Birchwood, MD;  Location: [facility];  Service:  Gynecology;  Laterality: Bilateral;   WISDOM TOOTH EXTRACTION      There were no vitals filed for this visit.   Subjective Assessment - 04/20/20 0929    Subjective "I am still feeling off, the R shoulder is bothering more which is causing some headaches."    Pain Score 0-No pain    Pain Type Chronic pain              [facility] PT Assessment - 04/20/20 0001       Assessment   Medical Diagnosis Paresthesia and pain of left extremity M79.609, R20.2    Referring Provider (PT) Cira Rue NP                         [facility] Adult PT Treatment/Exercise - 04/20/20 0001      Neck Exercises: Machines for Strengthening   UBE (Upper Arm Bike) L3 x 4 min       Shoulder Exercises: Standing   Protraction Strengthening   sustained prograction rolling pink foam rol up the wall   Horizontal ABduction Strengthening;20 reps;Theraband;Both    Theraband Level (Shoulder Horizontal ABduction) Level 3 (Green)    External Rotation Strengthening;20 reps;Theraband    Theraband Level (Shoulder External Rotation) Level 3 (Green)    Internal Rotation Strengthening;Left;20 reps;Theraband    Theraband Level (Shoulder Internal Rotation) Level 3 (Green)    Flexion Strengthening;Both;20 reps;Weights   scaption angle   Extension Strengthening;20 reps;Theraband    Theraband Level (Shoulder Extension) Level 3 (Green)    Row Theraband;20 reps;Strengthening;Both    Theraband Level (Shoulder Row) Level 3 (Green)      Shoulder Exercises: ROM/Strengthening   UBE (Upper Arm Bike) L3 x 6 min    fwd/bwd 3 min      Manual Therapy   Manual therapy comments skilled palpation and monitoring of pt throughout TPDN    Joint Mobilization T1-T8 PA grade IV with cavitation noted with upper thoracic region    Soft tissue mobilization IASTM along the upper trap/ levator scapulae      Neck Exercises: Stretches   Upper Trapezius Stretch 2 reps;30 seconds;Left;Right   with pressure   Levator Stretch 2 reps;30 seconds            Trigger Point Dry Needling - 04/20/20 0001    Consent Given? Yes    Education Handout Provided Previously provided    Upper Trapezius Response Twitch reponse elicited;Palpable increased muscle length    Levator Scapulae Response Twitch response elicited;Palpable increased muscle length   R                 PT Short Term Goals - 03/03/20 0933        PT SHORT TERM GOAL #1   Title pt to be I with inital HEP    Period Weeks    Status Achieved             PT Long Term Goals - 04/01/20 1610      PT LONG TERM GOAL #1   Title pt to report </= 1/10 pain in the L shoulder and neck with minimal to no report of stiffness    Period Weeks    Status On-going      PT LONG TERM GOAL #2   Title increase Gross L shoulder strength to >/= 4+/5 to promote stability with lifting/ carrying and ADls with no report of pain or stiffness    Period Weeks    Status Partially  Met      PT LONG TERM GOAL #3   Title increase FOTO score to </= 31% limited to demo improvement in function    Period Weeks    Status On-going      PT LONG TERM GOAL #4   Title pt to be I with all HEP given to maintain and progress current level of function    Period Weeks                 Plan - 04/20/20 1011    Clinical Impression Statement pt reports no pain mostly soreness from stiffness in bil upper traps/ levator scapuale. continued TPDN focusing on bil upper trap/ levator scapuale followed with thoracic mobs. cotninued working on posterior shoulder strengthening with increased reps to promote endurance. plan to reassess next visit and update HEP and discharge to HEP.    PT Treatment/Interventions ADLs/Self Care Home Management;Cryotherapy;Therapeutic activities;Therapeutic exercise;Neuromuscular re-education;Manual techniques;Passive range of motion;Dry needling;Taping;Spinal Manipulations;Patient/family education    PT Next Visit Plan ROM, goals, FOTO,  upper trap and surroudning musculature, scapular stretching routine, scapulohumeral rhythm.    PT Home Exercise Plan [id number] - upper trap stretch, shoulder IR with red band, rib mobs, bicep stretch, Foam roll routine, rows, shoulder ER, money, horiztonal abduction    Consulted and Agree with Plan of Care Patient           Patient will benefit from skilled therapeutic intervention in order to improve the  following deficits and impairments:  Improper body mechanics, Increased muscle spasms, Decreased strength, Postural dysfunction, Pain  Visit Diagnosis: Cervicalgia  Other muscle spasm  Chronic left shoulder pain     Problem List Patient Active Problem List   Diagnosis Date Noted   Chemotherapy-induced peripheral neuropathy ([place]) 01/13/2020   Breast asymmetry following reconstructive surgery 11/16/2018   S/P mastectomy, bilateral 07/24/2018   S/P breast reconstruction 07/24/2018   Port-A-Cath in place 08/18/2017   Genetic testing 04/17/2017   BRCA2 gene mutation positive 04/17/2017   Family history of breast cancer    Family history of ovarian cancer    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 [place], [place], [address] Phone: [phone]   Fax:  [phone]  Name: Madison Savage MRN: [id number] Date of Birth: 05/06/1971

## 2020-04-24 NOTE — Progress Notes (Signed)
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, BRCA2+  SUMMARY OF ONCOLOGIC HISTORY: Oncology History Overview Note  Cancer Staging Breast cancer metastasized to axillary lymph node, left [facility]) Staging form: Breast, AJCC 8th Edition - Clinical stage from 03/22/2017: Stage Unknown (cTX, cN1, cM0, GX, ER: Positive, PR: Positive, HER2: Negative) - Signed by [name], MD on 03/29/2017 - Pathologic stage from 04/27/2017: Stage IB (pT1c, pN2a, cM0, G2, ER: Positive, PR: Positive, HER2: Negative) - Signed by [name], MD on 05/25/2017 \   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 ([place])  03/21/2017 Mammogram   Diagnostic mammogram bilateral 03/21/17 IMPRESSION: INCOMPLETE - ADDITIONAL IMAGING EVALUATION NEEDED 1. The 0.6 cm oval mass in the right breast at 11 o'clock middle depth is indeterminate. An ultrasound is recommended.  2. The 0.5cm round focal asymmetry in the left breast central to the nipple middle depth is indeterminate. An ultrasound is recommended.  3. Ultrasound of the palpable abnormalities in the axilla and far left lateral breast is recommended.  4. Multiple clusters of pleomorphic calcifications in the left upper outer breast anterior depth spanning a 2.5 cm area are highly suggestive of malignancy. A stereotactic biopsy is recommended.     03/21/2017 Imaging   US Breast bilateral 03/21/17 IMPRESSION: HIGHLY SUGGESTIVE OF MALIGNANCY 1. Six  distinct abnormal left axillary nodes, 2 of which correspond to the palpable lumps felt by the patient. Findings concerning for metastatic adenopathy. Ultrasound guided biopsy of one of these nodes is recommended.  2. Small 5 mm palpable, oval mass in the left breast 3:00 areolar margin is at an intermediate suspicion. Ultrasound guided biopsy is recommended.  3. Isoechoic 8 mm mass in the right breast 10:00 5 cm from the nipple is also an intermediate suspicion for malignancy, and ultrasound guided biopsy is recommended.  4. Stereotactic guided biopsy for the highly suspicious calcifications in the left breast also recommended as detailed in the mammography report.     03/22/2017 Initial Biopsy   Diagnosis 03/22/17 Lymph node, needle/core biopsy, left axillary node -METASTATIC CARCINOMA, SEE COMMENT Microscopic Comment   03/22/2017 Receptors her2   Lymph node biopsy ER 95% positive, strong staining, PR 5% positive, weak staining, HER-2 negative, with HER2/CEP17 ratio 1.23 and copy #3.95.   03/27/2017 Pathology Results   Diagnosis 03/27/17 1. Breast, left, needle core biopsy -FIBROADENOMATOID NODULE WITH CALCIFICATIONS 2.Breast, left, needle core biopsy -DUCTAL CARCINOMA IN SITU, HIGH GRADE -SEE COMMENT  3. Breast, right, needle, core biopsy -FIBROADENOMA -NO MALIGNANCY IDENTIFIED    03/29/2017 Initial Diagnosis   Breast cancer metastasized to axillary lymph node, left ([place])   04/07/2017 Imaging   CT CAP 04/07/17 IMPRESSION: 1. Several prominent left axillary lymph nodes which may correspond to biopsy proven axillary lymph node metastasis. 2. No thoracic adenopathy identified or evidence of distant metastatic disease.   04/07/2017 Imaging   Bone whole body scan 04/07/17 IMPRESSION: Negative for evidence of osseous metastatic disease.   04/17/2017 Genetic Testing   BRCA2 c.[id number] (p.Glu260Serfs*15) pathogenic mutation identified on the 9 gene  STAT panel.  The STAT Breast cancer  panel offered by [facility] includes sequencing and rearrangement analysis for the following 9 genes:  ATM, BRCA1, BRCA2, CDH1, CHEK2, PALB2, PTEN, STK11 and TP53.   The report date is April 17, 2017.  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  The Hereditary Gene Panel offered by [facility] includes sequencing and/or deletion duplication testing of the following 46 genes: APC, ATM, AXIN2, BARD1, BMPR1A, BRCA1, BRCA2, BRIP1, CDH1, CDKN2A (p14ARF), CDKN2A (p16INK4a), CHEK2, CTNNA1, DICER1, EPCAM (Deletion/duplication testing only), GREM1 (promoter region deletion/duplication testing only), KIT, MEN1, MLH1, MSH2, MSH3, MSH6, MUTYH, NBN, NF1, NHTL1, PALB2, PDGFRA, PMS2, POLD1, POLE, PTEN, RAD50, RAD51C, RAD51D, SDHB, SDHC, SDHD, SMAD4, SMARCA4. STK11, TP53, TSC1, TSC2, and VHL.  The following genes were evaluated for sequence changes only: SDHA and HOXB13 c.251G>A variant only.  The report date is April 29, 2017.    04/27/2017 Surgery   Surgey 04/27/17 BILATERAL SKIN SPARING MASTECTOMY and LEFT AXILLARY LYMPH NODE and BILATERAL BREAST RECONSTRUCTION WITH PLACEMENT OF TISSUE EXPANDER AND FLEX HD (ACELLULAR HYDRATED DERMIS) Bilateral BY Dr. Donell Beers and Dr. Ulice Bold    04/27/2017 Pathology Results    Diagnosis  04/27/17 1. Breast, simple mastectomy, Right - FIBROCYSTIC CHANGES WITH ADENOSIS. - USUAL DUCTAL HYPERPLASIA. - HEALING BIOPSY SITE. - THERE IS NO EVIDENCE OF MALIGNANCY. 2. Breast, simple mastectomy, Left - INVASIVE DUCTAL CARCINOMA, GRADE II/III, SPANNING 1.2 CM. - DUCTAL CARCINOMA IN SITU WITH CALCIFICATIONS, HIGH GRADE. - INVASIVE DUCTAL CARCINOMA IS FOCALLY PRESENT AT THE ANTERIOR MARGIN. - DUCTAL CARCINOMA IN SITU IS BROADLY 0.1 CM TO THE POSTERIOR MARGIN. - SEE ONCOLOGY TABLE BELOW. 3. Lymph nodes, regional resection, Left axillary - METASTATIC CARCINOMA IN 8 OF 21 LYMPH NODES  (8/21). - SEE COMMENT   06/09/2017 - 11/16/2017 Chemotherapy   Adriamycin and Cytoxan every 2 weeks, for 4 cycles 06/09/17-07/21/17, followed by weekly Abraxane and carboplatin for 12 weeks starting 08/11/17. Held Carbo with cycle 2 due to poor toleration. Changed to single agent Abraxane for Cycle 3 on 09/08/17. Completed on 11/16/17 with reduced Abraxane due to body aches.    12/06/2017 Surgery   BREAST CAPSULECTOMY WITH IMPLANT EXCHANGE and PLACEMENT OF BREAST IMPLANTS and REMOVAL PORT-A-CATH  by Dr. Ulice Bold on 12/06/17   12/18/2017 Survivorship   Survivorship clinic with NP Mardella Layman    01/03/2018 - 02/19/2018 Radiation Therapy   Radiation therapy 01/03/18 - 02/19/18.   03/2018 -  Anti-estrogen oral therapy   Adjuvant tamoxifen started in July 2019, switched to letrozole started 09/2018 after BSO but due to allergy reaction with hives, ithcing and swelling she d/c and restarted Tamoxifen.     08/31/2018 Surgery   08/31/2018 BSO With Dr. Andrey Farmer  Diagnosis 1. Ovary and fallopian tube, right RIGHT OVARY AND RIGHT FALLOPIAN TUBE: - UNREMARKABLE. - NO ENDOMETRIOSIS OR MALIGNANCY. 2. Ovary and fallopian tube, left LEFT OVARY AND LEFT FALLOPIAN TUBE: - UNREMARKABLE. - NO ENDOMETRIOSIS OR MALIGNANCY.      CURRENT THERAPY:  Adjuvant tamoxifen started in July 2019, switched to letrozole started 09/2018 after BSObut due to allergy reaction with hives, itching and swelling she d/c and restarted Tamoxifen in 09/2018.  INTERVAL HISTORY:  JOCABED CHEESE is here for a follow up of left breast cancer. She was last seen by me 6 months ago and has been seen by NP Lacie in interim. She presents to the clinic alone. She notes she is doing well. She notes she received COVID19  booster vaccine on 04/23/20. She notes she had LN swelling from this. She denies any other symptoms.  She is clinically doing well. She saw Dr Mickeal Skinner for her numbness. She has gotten dry needling and PT. It found that her numbness was  muscle and sciatica related. Her numbness has much improved and has residual numbness in her feet.     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  MEDICAL HISTORY:  Past Medical History:  Diagnosis Date  . Anemia   . Anxiety   . BRCA2 positive   . Chronic ITP (idiopathic thrombocytopenia) ([facility])   . Family history of breast cancer   . Family history of ovarian cancer   . Headache    last migraine...9/24  . History of cancer chemotherapy    left breast 06-09-2017  to 11-16-2017  . History of cardiac murmur as a child    infant  . History of external beam radiation therapy    left breast   01-03-2018  to 02-19-2018  . MTHFR mutation ([place])   . PONV (postoperative nausea and vomiting)   . 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 [facility]) oncologist-- dr Burr Medico   dx 03-22-2017--- 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/  04-27-2018  s/p  bilateral mastectomy with left sln dissection's with reconstruction,  completed chemothearpy 11-16-2017,  completed radiation 02-19-2018,  started tamoxifen 07/ 2019  . Wears contact lenses     SURGICAL HISTORY: Past Surgical History:  Procedure Laterality Date  . AXILLARY LYMPH NODE DISSECTION Left 04/27/2017   Procedure: AXILLARY LYMPH NODE DISSECTION;  Surgeon: [name], MD;  Location: [place];  Service: General;  Laterality: Left;  . BILATERAL TOTAL MASTECTOMY WITH AXILLARY LYMPH NODE DISSECTION Bilateral 04/27/2017   Procedure: SKIN SPARING MASTECTOMY;  Surgeon: [name], MD;  Location: [place];  Service: General;  Laterality: Bilateral;  . BREAST CAPSULECTOMY WITH IMPLANT EXCHANGE Bilateral 12/06/2017   Procedure: BREAST CAPSULECTOMY WITH IMPLANT EXCHANGE;  Surgeon: Wallace Going, DO;  Location: [place];  Service: Plastics;  Laterality: Bilateral;  . BREAST RECONSTRUCTION WITH PLACEMENT OF TISSUE EXPANDER AND FLEX HD (ACELLULAR HYDRATED DERMIS) Bilateral 04/27/2017   Procedure: BILATERAL BREAST RECONSTRUCTION WITH PLACEMENT OF TISSUE EXPANDER AND FLEX HD (ACELLULAR HYDRATED DERMIS);  Surgeon: Wallace Going, DO;  Location: [place];  Service: Plastics;  Laterality: Bilateral;  . PLACEMENT OF BREAST IMPLANTS Bilateral 12/06/2017   Procedure: PLACEMENT OF BREAST IMPLANTS;  Surgeon: Wallace Going, DO;  Location: [place];  Service: Plastics;  Laterality: Bilateral;  . PORT-A-CATH REMOVAL Right 12/06/2017   Procedure: REMOVAL PORT-A-CATH;  Surgeon: Wallace Going, DO;  Location: [place];  Service: Plastics;  Laterality: Right;  . PORTACATH PLACEMENT Right 06/01/2017   Procedure: INSERTION PORT-A-CATH RIGHT SUBCLAVIAN;  Surgeon: [name], MD;  Location: [place];  Service: General;  Laterality: Right;  . ROBOTIC ASSISTED  SALPINGO OOPHERECTOMY Bilateral 08/31/2018   Procedure: XI ROBOTIC ASSISTED BILATERAL  SALPINGO OOPHORECTOMY;  Surgeon: Everitt Amber, MD;  Location: [facility];  Service: Gynecology;  Laterality: Bilateral;  . WISDOM TOOTH EXTRACTION      I have reviewed the social history and family history with the patient and they are unchanged from previous note.  ALLERGIES:  is allergic to clindamycin, contrast media [iodinated diagnostic  agents], penicillins, and codeine.  MEDICATIONS:  Current Outpatient Medications  Medication Sig Dispense Refill  . ALPRAZolam (XANAX) 0.25 MG tablet Take 1 tablet (0.25 mg total) by mouth at bedtime as needed for anxiety. (Patient not taking: Reported on 02/05/2020) 30 tablet 0  . ascorbic acid (VITAMIN C) 500 MG tablet Take by mouth.    Marland Kitchen azithromycin (ZITHROMAX Z-PAK) 250 MG tablet Take as prescribed (Patient not taking: Reported on 02/05/2020) 6 each 0  . b complex vitamins capsule Take 1 capsule by mouth daily.    . Cholecalciferol (VITAMIN D-3 PO) Take 1 capsule by mouth daily.    Marland Kitchen gabapentin (NEURONTIN) 100 MG capsule Take 3 capsules (300 mg total) by mouth at bedtime. 90 capsule 5  . ibuprofen (ADVIL,MOTRIN) 800 MG tablet Take 1 tablet (800 mg total) by mouth every 8 (eight) hours as needed. (Patient not taking: Reported on 02/05/2020) 30 tablet 0  . MAGNESIUM PO Take 1 tablet by mouth daily. Take Muscle cramps    . methylPREDNISolone (MEDROL DOSEPAK) 4 MG TBPK tablet Take as prescribed (Patient not taking: Reported on 02/05/2020) 21 tablet 0  . Misc Natural Products (CALCIUM PLUS ADVANCED PO) Take 1 tablet by mouth daily.    . Multiple Vitamins-Calcium (ONE-A-DAY WOMENS PO) Take 1 tablet by mouth daily.    Marland Kitchen OVER THE COUNTER MEDICATION Take 1 capsule by mouth daily. Biotin/MSM    . tamoxifen (NOLVADEX) 20 MG tablet TAKE 1 TABLET BY MOUTH EVERY DAY 90 tablet 1   No current facility-administered medications for this visit.   Facility-Administered Medications Ordered in Other Visits  Medication Dose Route Frequency Provider Last Rate Last Admin  . sodium chloride flush (NS) 0.9 % injection 10 mL  10 mL Intravenous PRN [name], MD   10 mL at 07/21/17 1323    PHYSICAL EXAMINATION: ECOG PERFORMANCE STATUS: 0 - Asymptomatic  Vitals:   04/27/20 1402  BP: 126/72  Pulse: 87  Resp: 18  Temp: 97.8 F (36.6 C)  SpO2: 99%   Filed Weights   04/27/20 1402  Weight: 126 lb 4.8 oz (57.3 kg)      GENERAL:alert, no distress and comfortable SKIN: skin color, texture, turgor are normal, no rashes or significant lesions EYES: normal, Conjunctiva are pink and non-injected, sclera clear  NECK: supple, thyroid normal size, non-tender, without nodularity LYMPH:  no palpable lymphadenopathy in the cervical, axillary  LUNGS: clear to auscultation and percussion with normal breathing effort HEART: regular rate & rhythm and no murmurs and no lower extremity edema ABDOMEN:abdomen soft, non-tender and normal bowel sounds Musculoskeletal:no cyanosis of digits and no clubbing  NEURO: alert & oriented x 3 with fluent speech, no focal motor/sensory deficits BREAST: S/p b/l mastectomy and reconstruction: Surgical incision healed well with scar tissue (+) Enlarged palpable right LN.  Breast exam benign.   LABORATORY DATA:  I have reviewed the data as listed CBC Latest Ref Rng & Units 04/27/2020 11/28/2019 10/28/2019  WBC 4.0 - 10.5 K/uL 5.2 5.7 6.0  Hemoglobin 12.0 - 15.0 g/dL 13.2 14.6 13.3  Hematocrit 36 - 46 %  39.6 44.0 39.7  Platelets 150 - 400 K/uL 172 195 168     CMP Latest Ref Rng & Units 04/27/2020 11/28/2019 10/28/2019  Glucose 70 - 99 mg/dL 148(H) 98 105(H)  BUN 6 - 20 mg/dL $Remove'13 15 19  'rqqNFJZ$ Creatinine 0.44 - 1.00 mg/dL 1.07(H) 0.96 0.96  Sodium 135 - 145 mmol/L 139 142 141  Potassium 3.5 - 5.1 mmol/L 4.1 4.3 4.5  Chloride 98 - 111 mmol/L 105 107 105  CO2 22 - 32 mmol/L $RemoveB'25 27 28  'SKUilqbA$ Calcium 8.9 - 10.3 mg/dL 9.4 9.2 9.2  Total Protein 6.5 - 8.1 g/dL 6.3(L) 7.1 6.3(L)  Total Bilirubin 0.3 - 1.2 mg/dL 0.2(L) 0.3 <0.2(L)  Alkaline Phos 38 - 126 U/L 56 61 51  AST 15 - 41 U/L 11(L) 16 14(L)  ALT 0 - 44 U/L $Remo'8 14 13      'GRqyg$ RADIOGRAPHIC STUDIES: I have personally reviewed the radiological images as listed and agreed with the findings in the report. No results found.   ASSESSMENT & PLAN:  EMMABELLE FEAR is a 49 y.o. female with    1. 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 BRCA 2 +. Treated withb/l mastectomy, adjuvant chemo and radiation. -She startedanti-estrogen therapy withTamoxifen in 03/2018. Shestopped Tamoxifen before BSOand started letrozole in 09/2018.Unfortunately she had allergic reaction to letrozole with hives, itching and swelling so she stopped and restarted Tamoxifen in 09/2018. She is toleratingvery well overall withoccasional hot flashes and hair thinning. -She is clinically doing well. Lab reviewed, her CBC and CMP are within normal limits except BG 148, Cr 1.07, Protein 6.3. Her physical exam and was unremarkable except enlarged right axillary LN from COVID vaccine. There is no clinical concern for recurrence. -Continue surveillance. She has had a bilateral mastectomy, no need mammogram -Continue Tamoxifen  -f/u in6 months -She has received her booster vaccine for COVID (04/23/20).   2. Chronic ITP -Diagnosed during her last pregnancy. -CBC has been normal in the past year.   3. Anxiety -She has been on Xanax for significant anxiety. She has ran out and able to manage without it.   4. BRCA2 MUTATION (+)  -She has had a bilateral mastectomy, and bilateral BSO -We will continue to monitor her other risk of cancer, such as pancreatic cancer, brain tumor, GU cancer clinically  -Her children will proceed with Genetic testing when they reach their 59s -She has no family history of pancreatic cancer, melanoma, or colon cancer.  She does not need specific screening test for the above cancer. She should start screening Colonoscopy by age of 89.  53. 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 It found that her numbness was muscle and sciatica  related. -She has gotten massage therapist, chiropractor, dry needling and PT. Her numbness has much improved and has residual numbness in her feet. -She is on Gabapentin $RemoveBefor'300mg'[id number]$  at night.   6. Bone Health  -She will obtain baseline DEXA on 07/07/20 -She is on Tamoxifen which can strengthen her bone.   Plan -She is clinically doing well -Continue tamoxifen -Lab and f/u in 6 months  -DEXA on 07/07/20    No problem-specific Assessment & Plan notes found for this encounter.   No orders  of the defined types were placed in this encounter.  All questions were answered. The patient knows to call the clinic with any problems, questions or concerns. No barriers to learning was detected. The total time spent in the appointment was 30 minutes.     [name], MD 04/27/2020   I, Joslyn Devon, am acting as scribe for [name], MD.   I have reviewed the above documentation for accuracy and completeness, and I agree with the above.

## 2020-04-27 ENCOUNTER — Ambulatory Visit: Payer: BC Managed Care – PPO | Admitting: Physical Therapy

## 2020-04-27 ENCOUNTER — Other Ambulatory Visit: Payer: Self-pay

## 2020-04-27 ENCOUNTER — Encounter: Payer: Self-pay | Admitting: Physical Therapy

## 2020-04-27 ENCOUNTER — Inpatient Hospital Stay: Payer: BC Managed Care – PPO

## 2020-04-27 ENCOUNTER — Inpatient Hospital Stay: Payer: BC Managed Care – PPO | Attending: Hematology | Admitting: Hematology

## 2020-04-27 VITALS — BP 126/72 | HR 87 | Temp 97.8°F | Resp 18 | Ht 68.0 in | Wt 126.3 lb

## 2020-04-27 DIAGNOSIS — Z923 Personal history of irradiation: Secondary | ICD-10-CM | POA: Diagnosis not present

## 2020-04-27 DIAGNOSIS — D693 Immune thrombocytopenic purpura: Secondary | ICD-10-CM | POA: Insufficient documentation

## 2020-04-27 DIAGNOSIS — C773 Secondary and unspecified malignant neoplasm of axilla and upper limb lymph nodes: Secondary | ICD-10-CM | POA: Diagnosis not present

## 2020-04-27 DIAGNOSIS — Z79899 Other long term (current) drug therapy: Secondary | ICD-10-CM | POA: Diagnosis not present

## 2020-04-27 DIAGNOSIS — Z8041 Family history of malignant neoplasm of ovary: Secondary | ICD-10-CM | POA: Diagnosis not present

## 2020-04-27 DIAGNOSIS — Z79810 Long term (current) use of selective estrogen receptor modulators (SERMs): Secondary | ICD-10-CM | POA: Diagnosis not present

## 2020-04-27 DIAGNOSIS — Z1321 Encounter for screening for nutritional disorder: Secondary | ICD-10-CM

## 2020-04-27 DIAGNOSIS — M25512 Pain in left shoulder: Secondary | ICD-10-CM | POA: Diagnosis not present

## 2020-04-27 DIAGNOSIS — Z90722 Acquired absence of ovaries, bilateral: Secondary | ICD-10-CM | POA: Insufficient documentation

## 2020-04-27 DIAGNOSIS — C50912 Malignant neoplasm of unspecified site of left female breast: Secondary | ICD-10-CM

## 2020-04-27 DIAGNOSIS — C50412 Malignant neoplasm of upper-outer quadrant of left female breast: Secondary | ICD-10-CM | POA: Insufficient documentation

## 2020-04-27 DIAGNOSIS — Z7952 Long term (current) use of systemic steroids: Secondary | ICD-10-CM | POA: Diagnosis not present

## 2020-04-27 DIAGNOSIS — Z9013 Acquired absence of bilateral breasts and nipples: Secondary | ICD-10-CM | POA: Diagnosis not present

## 2020-04-27 DIAGNOSIS — M25612 Stiffness of left shoulder, not elsewhere classified: Secondary | ICD-10-CM | POA: Diagnosis not present

## 2020-04-27 DIAGNOSIS — M62838 Other muscle spasm: Secondary | ICD-10-CM | POA: Diagnosis not present

## 2020-04-27 DIAGNOSIS — F419 Anxiety disorder, unspecified: Secondary | ICD-10-CM | POA: Diagnosis not present

## 2020-04-27 DIAGNOSIS — Z803 Family history of malignant neoplasm of breast: Secondary | ICD-10-CM | POA: Insufficient documentation

## 2020-04-27 DIAGNOSIS — Z170 Estrogen receptor positive status [ER+]: Secondary | ICD-10-CM | POA: Insufficient documentation

## 2020-04-27 DIAGNOSIS — M542 Cervicalgia: Secondary | ICD-10-CM

## 2020-04-27 DIAGNOSIS — G8929 Other chronic pain: Secondary | ICD-10-CM

## 2020-04-27 LAB — COMPREHENSIVE METABOLIC PANEL
ALT: 8 U/L (ref 0–44)
AST: 11 U/L — ABNORMAL LOW (ref 15–41)
Albumin: 3.7 g/dL (ref 3.5–5.0)
Alkaline Phosphatase: 56 U/L (ref 38–126)
Anion gap: 9 (ref 5–15)
BUN: 13 mg/dL (ref 6–20)
CO2: 25 mmol/L (ref 22–32)
Calcium: 9.4 mg/dL (ref 8.9–10.3)
Chloride: 105 mmol/L (ref 98–111)
Creatinine, Ser: 1.07 mg/dL — ABNORMAL HIGH (ref 0.44–1.00)
GFR calc Af Amer: >60 mL/min (ref >60)
GFR calc non Af Amer: >60 mL/min (ref >60)
Glucose, Bld: 148 mg/dL — ABNORMAL HIGH (ref 70–99)
Potassium: 4.1 mmol/L (ref 3.5–5.1)
Sodium: 139 mmol/L (ref 135–145)
Total Bilirubin: 0.2 mg/dL — ABNORMAL LOW (ref 0.3–1.2)
Total Protein: 6.3 g/dL — ABNORMAL LOW (ref 6.5–8.1)

## 2020-04-27 LAB — CBC WITH DIFFERENTIAL/PLATELET
Abs Immature Granulocytes: 0.02 10*3/uL (ref 0.00–0.07)
Basophils Absolute: 0 10*3/uL (ref 0.0–0.1)
Basophils Relative: 1 %
Eosinophils Absolute: 0.1 10*3/uL (ref 0.0–0.5)
Eosinophils Relative: 2 %
HCT: 39.6 % (ref 36.0–46.0)
Hemoglobin: 13.2 g/dL (ref 12.0–15.0)
Immature Granulocytes: 0 %
Lymphocytes Relative: 24 %
Lymphs Abs: 1.3 10*3/uL (ref 0.7–4.0)
MCH: 30.4 pg (ref 26.0–34.0)
MCHC: 33.3 g/dL (ref 30.0–36.0)
MCV: 91.2 fL (ref 80.0–100.0)
Monocytes Absolute: 0.4 10*3/uL (ref 0.1–1.0)
Monocytes Relative: 8 %
Neutro Abs: 3.4 10*3/uL (ref 1.7–7.7)
Neutrophils Relative %: 65 %
Platelets: 172 10*3/uL (ref 150–400)
RBC: 4.34 MIL/uL (ref 3.87–5.11)
RDW: 12.9 % (ref 11.5–15.5)
WBC: 5.2 10*3/uL (ref 4.0–10.5)
nRBC: 0 % (ref 0.0–0.2)

## 2020-04-27 NOTE — Therapy (Signed)
[place], [place], [address] Phone: [phone]   Fax:  [phone]  Physical Therapy Treatment / Discharge  Patient Details  Name: Madison Savage MRN: [id number] Date of Birth: [birth year] Referring Provider (PT): Cira Rue NP   Encounter Date: 04/27/2020   PT End of Session - 04/27/20 1148    Visit Number 9    Number of Visits 9    Date for PT Re-Evaluation 04/27/20    PT Start Time 1146    PT Stop Time 1232    PT Time Calculation (min) 46 min    Activity Tolerance Patient tolerated treatment well    Behavior During Therapy [facility] for tasks assessed/performed           Past Medical History:  Diagnosis Date  . Anemia   . Anxiety   . BRCA2 positive   . Chronic ITP (idiopathic thrombocytopenia) (HCC)   . Family history of breast cancer   . Family history of ovarian cancer   . Headache    last migraine...9/24  . History of cancer chemotherapy    left breast 06-09-2017  to 11-16-2017  . History of cardiac murmur as a child    infant  . History of external beam radiation therapy    left breast   01-03-2018  to 02-19-2018  . MTHFR mutation ([place])   . PONV (postoperative nausea and vomiting)   . 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 Wears contact lenses     Past Surgical History:  Procedure Laterality Date  . AXILLARY LYMPH NODE DISSECTION Left 04/27/2017   Procedure: AXILLARY LYMPH NODE DISSECTION;  Surgeon: [name], MD;  Location: [place];  Service: General;  Laterality: Left;  . BILATERAL TOTAL MASTECTOMY WITH AXILLARY LYMPH NODE DISSECTION  Bilateral 04/27/2017   Procedure: SKIN SPARING MASTECTOMY;  Surgeon: [name], MD;  Location: [place];  Service: General;  Laterality: Bilateral;  . BREAST CAPSULECTOMY WITH IMPLANT EXCHANGE Bilateral 12/06/2017   Procedure: BREAST CAPSULECTOMY WITH IMPLANT EXCHANGE;  Surgeon: Wallace Going, DO;  Location: [place];  Service: Plastics;  Laterality: Bilateral;  . BREAST RECONSTRUCTION WITH PLACEMENT OF TISSUE EXPANDER AND FLEX HD (ACELLULAR HYDRATED DERMIS) Bilateral 04/27/2017   Procedure: BILATERAL BREAST RECONSTRUCTION WITH PLACEMENT OF TISSUE EXPANDER AND FLEX HD (ACELLULAR HYDRATED DERMIS);  Surgeon: Wallace Going, DO;  Location: [place];  Service: Plastics;  Laterality: Bilateral;  . PLACEMENT OF BREAST IMPLANTS Bilateral 12/06/2017   Procedure: PLACEMENT OF BREAST IMPLANTS;  Surgeon: Wallace Going, DO;  Location: [place];  Service: Plastics;  Laterality: Bilateral;  . PORT-A-CATH REMOVAL Right 12/06/2017   Procedure: REMOVAL PORT-A-CATH;  Surgeon: Wallace Going, DO;  Location: [place];  Service: Plastics;  Laterality: Right;  . PORTACATH PLACEMENT Right 06/01/2017   Procedure: INSERTION PORT-A-CATH RIGHT SUBCLAVIAN;  Surgeon: [name], MD;  Location: [place];  Service: General;  Laterality: Right;  . ROBOTIC ASSISTED SALPINGO OOPHERECTOMY Bilateral 08/31/2018   Procedure: XI ROBOTIC ASSISTED BILATERAL  SALPINGO OOPHORECTOMY;  Surgeon: Everitt Amber, MD;  Location: [facility];  Service: Gynecology;  Laterality: Bilateral;  .  WISDOM TOOTH EXTRACTION      There were no vitals filed for this visit.   Subjective Assessment - 04/27/20 1150    Subjective "I am doing pretty good, still some stiffness in the shoulder."    Diagnostic tests 12/14/2019 MRI IMPRESSION:1. Minimal degenerative disc disease at T10-11 and T11-12 withoutsignificant stenosis.2. Otherwise  unremarkable and normal MRI of the thoracic spine. Noother significant disc pathology, stenosis, or evidence for neuralimpingement.    Currently in Pain? No/denies    Pain Orientation Left    Pain Type Chronic pain    Pain Onset More than a month ago    Pain Frequency Intermittent              [facility] PT Assessment - 04/27/20 0001      Assessment   Medical Diagnosis Paresthesia and pain of left extremity M79.609, R20.2    Referring Provider (PT) Cira Rue NP      Observation/Other Assessments   Focus on Therapeutic Outcomes (FOTO)  37% limited      Strength   Right Shoulder Flexion 4/5    Right Shoulder Extension 5/5    Right Shoulder ABduction 4/5    Right Shoulder Internal Rotation 4+/5    Right Shoulder External Rotation 4+/5    Left Shoulder Flexion 4/5    Left Shoulder Extension 4+/5    Left Shoulder ABduction 4/5    Left Shoulder Internal Rotation 4+/5    Left Shoulder External Rotation 4/5                         [facility] Adult PT Treatment/Exercise - 04/27/20 0001      Neck Exercises: Theraband   Rows Blue;10 reps    Rows Limitations cues to keep scapula retracted     Shoulder External Rotation 15 reps;Blue   L   Shoulder External Rotation Limitations cues to keep scapula retracted     Shoulder Internal Rotation 15 reps;Blue   L   Shoulder Internal Rotation Limitations cues to keep scapula retracted       Neck Exercises: Supine   Other Supine Exercise thorcic extension over bolster with elbows adducted      Neck Exercises: Stretches   Upper Trapezius Stretch 1 rep;30 seconds;Left;Right    Levator Stretch 1 rep;30 seconds;Left;Right                  PT Education - 04/27/20 1225    Education Details Reviewed HEP and updated HEP today and discussed how to progress with increased reps/ sets to promote endurance, as well as importance of continued strengthening. reviewed Final FOTO assessment.    Person(s) Educated Patient    Methods  Explanation;Verbal cues;Handout    Comprehension Verbalized understanding;Verbal cues required            PT Short Term Goals - 03/03/20 0933      PT SHORT TERM GOAL #1   Title pt to be I with inital HEP    Period Weeks    Status Achieved             PT Long Term Goals - 04/27/20 1201      PT LONG TERM GOAL #1   Title pt to report </= 1/10 pain in the L shoulder and neck with minimal to no report of stiffness    Baseline no report of pain , just stiffness.    Period Weeks    Status Partially Met  PT LONG TERM GOAL #2   Title increase Gross L shoulder strength to >/= 4+/5 to promote stability with lifting/ carrying and ADls with no report of pain or stiffness    Period Weeks    Status Partially Met      PT LONG TERM GOAL #3   Title increase FOTO score to </= 31% limited to demo improvement in function    Period Weeks    Status Partially Met      PT LONG TERM GOAL #4   Title pt to be I with all HEP given to maintain and progress current level of function    Period Weeks                 Plan - 04/27/20 1238    Clinical Impression Statement Shadawn has made great progress with physical therapy increasing shoulder strength and addtjionally reports no pain in the neck / shoulder. she does report continued flucutating stiffness in the neck and l shoulder that is relieved with stretching and exercise. She did well with all exercises today. She met or partially met all goals today and is able to maintain and progress current LOF IND and will be discharged from PT today.    PT Treatment/Interventions ADLs/Self Care Home Management;Cryotherapy;Therapeutic activities;Therapeutic exercise;Neuromuscular re-education;Manual techniques;Passive range of motion;Dry needling;Taping;Spinal Manipulations;Patient/family education    PT Next Visit Plan D/C    PT Home Exercise Plan [phone] - upper trap stretch, shoulder IR with red band, rib mobs, bicep stretch, Foam roll routine,  rows, shoulder ER, money, horiztonal abduction    Consulted and Agree with Plan of Care Patient           Patient will benefit from skilled therapeutic intervention in order to improve the following deficits and impairments:  Improper body mechanics, Increased muscle spasms, Decreased strength, Postural dysfunction, Pain  Visit Diagnosis: Cervicalgia  Other muscle spasm  Chronic left shoulder pain  Stiffness of left shoulder, not elsewhere classified     Problem List Patient Active Problem List   Diagnosis Date Noted  . Chemotherapy-induced peripheral neuropathy ([place]) 01/13/2020  . Breast asymmetry following reconstructive surgery 11/16/2018  . S/P mastectomy, bilateral 07/24/2018  . S/P breast reconstruction 07/24/2018  . Port-A-Cath in place 08/18/2017  . Genetic testing 04/17/2017  . BRCA2 gene mutation positive 04/17/2017  . Family history of breast cancer   . Family history of ovarian cancer   . 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 Cough 03/10/2014  . Esophagitis 03/10/2014  . Chronic ITP (idiopathic thrombocytopenia) (HCC) 03/10/2014  . ALLERGIC REACTION, ACUTE 07/22/2008    [place] 04/27/2020, 12:46 PM  [facility] [address] [place], [place], [address] Phone: [phone]   Fax:  [phone]  Name: Madison Savage MRN: [id number] Date of Birth: [birth year]    PHYSICAL THERAPY DISCHARGE SUMMARY  Visits from Start of Care: 9  Current functional level related to goals / functional outcomes: See goals, FOTO 37% limited   Remaining deficits: See assessment in note   Education / Equipment: HEP, theraband,   Plan: Patient agrees to discharge.  Patient goals were partially met. Patient is being discharged due to meeting the stated rehab goals.  ?????        Irem Stoneham PT, DPT, LAT, ATC  04/27/20  12:47 PM

## 2020-04-28 ENCOUNTER — Encounter: Payer: Self-pay | Admitting: Hematology

## 2020-04-28 ENCOUNTER — Telehealth: Payer: Self-pay | Admitting: Hematology

## 2020-04-28 LAB — CANCER ANTIGEN 27.29: CA 27.29: 6 U/mL (ref 0.0–38.6)

## 2020-04-28 NOTE — Telephone Encounter (Signed)
Scheduled per 8/23 los. Mailing pt appt calendar.

## 2020-06-24 ENCOUNTER — Encounter: Payer: Self-pay | Admitting: Hematology

## 2020-06-24 ENCOUNTER — Other Ambulatory Visit: Payer: Self-pay

## 2020-06-24 DIAGNOSIS — G629 Polyneuropathy, unspecified: Secondary | ICD-10-CM

## 2020-06-24 DIAGNOSIS — C50412 Malignant neoplasm of upper-outer quadrant of left female breast: Secondary | ICD-10-CM

## 2020-06-24 MED ORDER — GABAPENTIN 100 MG PO CAPS: 300.0000 mg | ORAL_CAPSULE | Freq: Every day | ORAL | 5 refills | Status: DC

## 2020-07-07 ENCOUNTER — Other Ambulatory Visit: Payer: BC Managed Care – PPO

## 2020-08-27 ENCOUNTER — Other Ambulatory Visit: Payer: Self-pay | Admitting: Hematology

## 2020-10-12 ENCOUNTER — Other Ambulatory Visit: Payer: Self-pay | Admitting: Hematology

## 2020-10-12 DIAGNOSIS — E2839 Other primary ovarian failure: Secondary | ICD-10-CM

## 2020-10-15 ENCOUNTER — Other Ambulatory Visit: Payer: BC Managed Care – PPO

## 2020-10-23 ENCOUNTER — Telehealth: Payer: Self-pay | Admitting: Hematology

## 2020-10-23 NOTE — Telephone Encounter (Signed)
Left message that upcoming appointment had been changed to virtual per provider's request. Gave option to call back to reschedule if needed.

## 2020-10-25 ENCOUNTER — Encounter: Payer: Self-pay | Admitting: Nurse Practitioner

## 2020-10-28 ENCOUNTER — Other Ambulatory Visit: Payer: BC Managed Care – PPO

## 2020-10-28 ENCOUNTER — Telehealth: Payer: BC Managed Care – PPO | Admitting: Nurse Practitioner

## 2020-11-03 ENCOUNTER — Other Ambulatory Visit: Payer: Self-pay

## 2020-11-03 ENCOUNTER — Inpatient Hospital Stay: Payer: BC Managed Care – PPO | Attending: Nurse Practitioner

## 2020-11-03 ENCOUNTER — Inpatient Hospital Stay (HOSPITAL_BASED_OUTPATIENT_CLINIC_OR_DEPARTMENT_OTHER): Payer: BC Managed Care – PPO | Admitting: Nurse Practitioner

## 2020-11-03 ENCOUNTER — Encounter: Payer: Self-pay | Admitting: Nurse Practitioner

## 2020-11-03 VITALS — BP 116/74 | HR 77 | Temp 98.5°F | Resp 16 | Ht 68.0 in | Wt 127.5 lb

## 2020-11-03 DIAGNOSIS — Z9013 Acquired absence of bilateral breasts and nipples: Secondary | ICD-10-CM | POA: Diagnosis not present

## 2020-11-03 DIAGNOSIS — G629 Polyneuropathy, unspecified: Secondary | ICD-10-CM

## 2020-11-03 DIAGNOSIS — Z803 Family history of malignant neoplasm of breast: Secondary | ICD-10-CM | POA: Diagnosis not present

## 2020-11-03 DIAGNOSIS — Z1321 Encounter for screening for nutritional disorder: Secondary | ICD-10-CM

## 2020-11-03 DIAGNOSIS — C773 Secondary and unspecified malignant neoplasm of axilla and upper limb lymph nodes: Secondary | ICD-10-CM | POA: Diagnosis not present

## 2020-11-03 DIAGNOSIS — Z9079 Acquired absence of other genital organ(s): Secondary | ICD-10-CM | POA: Diagnosis not present

## 2020-11-03 DIAGNOSIS — Z170 Estrogen receptor positive status [ER+]: Secondary | ICD-10-CM | POA: Diagnosis not present

## 2020-11-03 DIAGNOSIS — Z90722 Acquired absence of ovaries, bilateral: Secondary | ICD-10-CM | POA: Insufficient documentation

## 2020-11-03 DIAGNOSIS — Z79899 Other long term (current) drug therapy: Secondary | ICD-10-CM | POA: Diagnosis not present

## 2020-11-03 DIAGNOSIS — C50412 Malignant neoplasm of upper-outer quadrant of left female breast: Secondary | ICD-10-CM | POA: Diagnosis not present

## 2020-11-03 DIAGNOSIS — D693 Immune thrombocytopenic purpura: Secondary | ICD-10-CM | POA: Diagnosis not present

## 2020-11-03 DIAGNOSIS — Z1211 Encounter for screening for malignant neoplasm of colon: Secondary | ICD-10-CM

## 2020-11-03 DIAGNOSIS — Z1501 Genetic susceptibility to malignant neoplasm of breast: Secondary | ICD-10-CM | POA: Insufficient documentation

## 2020-11-03 DIAGNOSIS — C50912 Malignant neoplasm of unspecified site of left female breast: Secondary | ICD-10-CM

## 2020-11-03 DIAGNOSIS — Z923 Personal history of irradiation: Secondary | ICD-10-CM | POA: Insufficient documentation

## 2020-11-03 DIAGNOSIS — Z79810 Long term (current) use of selective estrogen receptor modulators (SERMs): Secondary | ICD-10-CM | POA: Diagnosis not present

## 2020-11-03 LAB — CBC WITH DIFFERENTIAL/PLATELET
Abs Immature Granulocytes: 0.01 10*3/uL (ref 0.00–0.07)
Basophils Absolute: 0 10*3/uL (ref 0.0–0.1)
Basophils Relative: 0 %
Eosinophils Absolute: 0.1 10*3/uL (ref 0.0–0.5)
Eosinophils Relative: 2 %
HCT: 40.1 % (ref 36.0–46.0)
Hemoglobin: 13.4 g/dL (ref 12.0–15.0)
Immature Granulocytes: 0 %
Lymphocytes Relative: 23 %
Lymphs Abs: 1.3 10*3/uL (ref 0.7–4.0)
MCH: 30.5 pg (ref 26.0–34.0)
MCHC: 33.4 g/dL (ref 30.0–36.0)
MCV: 91.3 fL (ref 80.0–100.0)
Monocytes Absolute: 0.4 10*3/uL (ref 0.1–1.0)
Monocytes Relative: 8 %
Neutro Abs: 3.5 10*3/uL (ref 1.7–7.7)
Neutrophils Relative %: 67 %
Platelets: 168 10*3/uL (ref 150–400)
RBC: 4.39 MIL/uL (ref 3.87–5.11)
RDW: 12.6 % (ref 11.5–15.5)
WBC: 5.4 10*3/uL (ref 4.0–10.5)
nRBC: 0 % (ref 0.0–0.2)

## 2020-11-03 LAB — COMPREHENSIVE METABOLIC PANEL
ALT: 11 U/L (ref 0–44)
AST: 13 U/L — ABNORMAL LOW (ref 15–41)
Albumin: 3.8 g/dL (ref 3.5–5.0)
Alkaline Phosphatase: 54 U/L (ref 38–126)
Anion gap: 8 (ref 5–15)
BUN: 16 mg/dL (ref 6–20)
CO2: 26 mmol/L (ref 22–32)
Calcium: 9.1 mg/dL (ref 8.9–10.3)
Chloride: 106 mmol/L (ref 98–111)
Creatinine, Ser: 1.01 mg/dL — ABNORMAL HIGH (ref 0.44–1.00)
GFR, Estimated: >60 mL/min (ref >60)
Glucose, Bld: 85 mg/dL (ref 70–99)
Potassium: 4.6 mmol/L (ref 3.5–5.1)
Sodium: 140 mmol/L (ref 135–145)
Total Bilirubin: 0.3 mg/dL (ref 0.3–1.2)
Total Protein: 6.2 g/dL — ABNORMAL LOW (ref 6.5–8.1)

## 2020-11-03 LAB — VITAMIN D 25 HYDROXY (VIT D DEFICIENCY, FRACTURES): Vit D, 25-Hydroxy: 44.86 ng/mL (ref 30–100)

## 2020-11-03 MED ORDER — TAMOXIFEN CITRATE 20 MG PO TABS: 20.0000 mg | ORAL_TABLET | Freq: Every day | ORAL | 3 refills | Status: DC

## 2020-11-03 MED ORDER — GABAPENTIN 100 MG PO CAPS: 300.0000 mg | ORAL_CAPSULE | Freq: Every day | ORAL | 5 refills | Status: DC

## 2020-11-03 NOTE — Progress Notes (Signed)
[place]   Telephone:([phone]) [phone] [phone]) [phone]   Clinic Follow up Note   Patient Care Team: Dorena Cookey, MD (Inactive) as PCP - General [name], MD as Consulting Physician (General Surgery) [name], MD as Consulting Physician (Hematology) Kyung Rudd, MD as Consulting Physician (Radiation Oncology) Brien Few, MD as Consulting Physician (Obstetrics and Gynecology) Gardenia Phlegm, NP as Nurse Practitioner (Hematology and Oncology) 11/03/2020  CHIEF COMPLAINT: Follow-up left breast cancer, BRCA2 positive  SUMMARY OF ONCOLOGIC HISTORY: Oncology History Overview Note  Cancer Staging Breast cancer metastasized to axillary lymph node, left ([place]) Staging form: Breast, AJCC 8th Edition - Clinical stage from 03/22/2017: Stage Unknown (cTX, cN1, cM0, GX, ER: Positive, PR: Positive, HER2: Negative) - Signed by [name], MD on 03/29/2017 - Pathologic stage from 04/27/2017: Stage IB (pT1c, pN2a, cM0, G2, ER: Positive, PR: Positive, HER2: Negative) - Signed by [name], MD on 05/25/2017 _0   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 ([place])  03/21/2017 Mammogram   Diagnostic mammogram bilateral 03/21/17 IMPRESSION: INCOMPLETE - ADDITIONAL IMAGING EVALUATION NEEDED 1. The 0.6 cm oval mass in the right breast at 11 o'clock middle depth is indeterminate. An ultrasound is recommended.  2. The 0.5cm round focal asymmetry in the left breast central to the nipple middle depth is indeterminate. An ultrasound is recommended.  3. Ultrasound of the palpable abnormalities in the axilla and far left lateral breast is recommended.  4. Multiple clusters of pleomorphic calcifications in the left upper outer breast anterior depth spanning a 2.5 cm area are highly suggestive of malignancy. A stereotactic biopsy is recommended.     03/21/2017 Imaging   US Breast bilateral 03/21/17 IMPRESSION: HIGHLY SUGGESTIVE OF MALIGNANCY 1. Six distinct  abnormal left axillary nodes, 2 of which correspond to the palpable lumps felt by the patient. Findings concerning for metastatic adenopathy. Ultrasound guided biopsy of one of these nodes is recommended.  2. Small 5 mm palpable, oval mass in the left breast 3:00 areolar margin is at an intermediate suspicion. Ultrasound guided biopsy is recommended.  3. Isoechoic 8 mm mass in the right breast 10:00 5 cm from the nipple is also an intermediate suspicion for malignancy, and ultrasound guided biopsy is recommended.  4. Stereotactic guided biopsy for the highly suspicious calcifications in the left breast also recommended as detailed in the mammography report.     03/22/2017 Initial Biopsy   Diagnosis 03/22/17 Lymph node, needle/core biopsy, left axillary node -METASTATIC CARCINOMA, SEE COMMENT Microscopic Comment   03/22/2017 Receptors her2   Lymph node biopsy ER 95% positive, strong staining, PR 5% positive, weak staining, HER-2 negative, with HER2/CEP17 ratio 1.23 and copy #3.95.   03/27/2017 Pathology Results   Diagnosis 03/27/17 1. Breast, left, needle core biopsy -FIBROADENOMATOID NODULE WITH CALCIFICATIONS 2.Breast, left, needle core biopsy -DUCTAL CARCINOMA IN SITU, HIGH GRADE -SEE COMMENT  3. Breast, right, needle, core biopsy -FIBROADENOMA -NO MALIGNANCY IDENTIFIED    03/29/2017 Initial Diagnosis   Breast cancer metastasized to axillary lymph node, left ([place])   04/07/2017 Imaging   CT CAP 04/07/17 IMPRESSION: 1. Several prominent left axillary lymph nodes which may correspond to biopsy proven axillary lymph node metastasis. 2. No thoracic adenopathy identified or evidence of distant metastatic disease.   04/07/2017 Imaging   Bone whole body scan 04/07/17 IMPRESSION: Negative for evidence of osseous metastatic disease.   04/17/2017 Genetic Testing   BRCA2 c.[id number] (p.Glu260Serfs*15) pathogenic mutation identified on the 9 gene STAT panel.  The STAT  Breast cancer panel offered  by [facility] includes sequencing and rearrangement analysis for the following 9 genes:  ATM, BRCA1, BRCA2, CDH1, CHEK2, PALB2, PTEN, STK11 and TP53.   The report date is April 17, 2017.  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  The Hereditary Gene Panel offered by [facility] includes sequencing and/or deletion duplication testing of the following 46 genes: APC, ATM, AXIN2, BARD1, BMPR1A, BRCA1, BRCA2, BRIP1, CDH1, CDKN2A (p14ARF), CDKN2A (p16INK4a), CHEK2, CTNNA1, DICER1, EPCAM (Deletion/duplication testing only), GREM1 (promoter region deletion/duplication testing only), KIT, MEN1, MLH1, MSH2, MSH3, MSH6, MUTYH, NBN, NF1, NHTL1, PALB2, PDGFRA, PMS2, POLD1, POLE, PTEN, RAD50, RAD51C, RAD51D, SDHB, SDHC, SDHD, SMAD4, SMARCA4. STK11, TP53, TSC1, TSC2, and VHL.  The following genes were evaluated for sequence changes only: SDHA and HOXB13 c.251G>A variant only.  The report date is April 29, 2017.    04/27/2017 Surgery   Surgey 04/27/17 BILATERAL SKIN SPARING MASTECTOMY and LEFT AXILLARY LYMPH NODE and BILATERAL BREAST RECONSTRUCTION WITH PLACEMENT OF TISSUE EXPANDER AND FLEX HD (ACELLULAR HYDRATED DERMIS) Bilateral BY Dr. Barry Dienes and Dr. Marla Roe    04/27/2017 Pathology Results    Diagnosis  04/27/17 1. Breast, simple mastectomy, Right - FIBROCYSTIC CHANGES WITH ADENOSIS. - USUAL DUCTAL HYPERPLASIA. - HEALING BIOPSY SITE. - THERE IS NO EVIDENCE OF MALIGNANCY. 2. Breast, simple mastectomy, Left - INVASIVE DUCTAL CARCINOMA, GRADE II/III, SPANNING 1.2 CM. - DUCTAL CARCINOMA IN SITU WITH CALCIFICATIONS, HIGH GRADE. - INVASIVE DUCTAL CARCINOMA IS FOCALLY PRESENT AT THE ANTERIOR MARGIN. - DUCTAL CARCINOMA IN SITU IS BROADLY 0.1 CM TO THE POSTERIOR MARGIN. - SEE ONCOLOGY TABLE BELOW. 3. Lymph nodes, regional resection, Left axillary - METASTATIC CARCINOMA IN 8 OF 21 LYMPH NODES (8/21). - SEE  COMMENT   06/09/2017 - 11/16/2017 Chemotherapy   Adriamycin and Cytoxan every 2 weeks, for 4 cycles 06/09/17-07/21/17, followed by weekly Abraxane and carboplatin for 12 weeks starting 08/11/17. Held Carbo with cycle 2 due to poor toleration. Changed to single agent Abraxane for Cycle 3 on 09/08/17. Completed on 11/16/17 with reduced Abraxane due to body aches.    12/06/2017 Surgery   BREAST CAPSULECTOMY WITH IMPLANT EXCHANGE and PLACEMENT OF BREAST IMPLANTS and REMOVAL PORT-A-CATH  by Dr. Marla Roe on 12/06/17   12/18/2017 Survivorship   Survivorship clinic with NP Mendel Ryder    01/03/2018 - 02/19/2018 Radiation Therapy   Radiation therapy 01/03/18 - 02/19/18.   03/2018 -  Anti-estrogen oral therapy   Adjuvant tamoxifen started in July 2019, switched to letrozole started 09/2018 after BSO but due to allergy reaction with hives, ithcing and swelling she d/c and restarted Tamoxifen.     08/31/2018 Surgery   08/31/2018 BSO With Dr. Denman George  Diagnosis 1. Ovary and fallopian tube, right RIGHT OVARY AND RIGHT FALLOPIAN TUBE: - UNREMARKABLE. - NO ENDOMETRIOSIS OR MALIGNANCY. 2. Ovary and fallopian tube, left LEFT OVARY AND LEFT FALLOPIAN TUBE: - UNREMARKABLE. - NO ENDOMETRIOSIS OR MALIGNANCY.     CURRENT THERAPY:  Adjuvant tamoxifen started in July 2019, switched to letrozole started 09/2018 after BSObut due to allergy reaction with hives, itching and swelling she d/c and restarted Tamoxifen in 09/2018.  INTERVAL HISTORY: Madison Savage returns for follow-up as scheduled.  She was last seen in person on 04/27/2020.  She is doing well overall.  She still has left outer knee aches she attributes to previous hamstring tear which also causes intermittent numbness in her feet which is overall improved.  She continues gabapentin which controls hot  flashes.  She continues to have tightness in her pectorals but can usually manually release this.  She did sit ups and has some focal tenderness in the low back, improving.   She has no new or persistent/worsening pains.  The right axillary lymph node eventually went away after Covid booster but is palpable again in the last few days.  She had a recent sinus infection.  No injections on the right side.  She is tolerating tamoxifen.  Denies unintentional weight loss, decreased appetite or energy, change in bowel habits, abdominal pain, bloating, bleeding, or other new concerns.   MEDICAL HISTORY:  Past Medical History:  Diagnosis Date  . Anemia   . Anxiety   . BRCA2 positive   . Chronic ITP (idiopathic thrombocytopenia) (H[facility])   . Family history of breast cancer   . Family history of ovarian cancer   . Headache    last migraine...9/24  . History of cancer chemotherapy    left breast 06-09-2017  to 11-16-2017  . History of cardiac murmur as a child    infant  . History of external beam radiation therapy    left breast   01-03-2018  to 02-19-2018  . MTHFR mutation   . PONV (postoperative nausea and vomiting)   . 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 [facility]) oncologist-- dr Burr Medico   dx 03-22-2017--- 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/  04-27-2018  s/p  bilateral mastectomy with left sln dissection's with reconstruction,  completed chemothearpy 11-16-2017,  completed radiation 02-19-2018,  started tamoxifen 07/ 2019  . Wears contact lenses     SURGICAL HISTORY: Past Surgical History:  Procedure Laterality Date  . AXILLARY LYMPH NODE DISSECTION Left 04/27/2017   Procedure: AXILLARY LYMPH NODE DISSECTION;  Surgeon: [name], MD;  Location: [place];  Service: General;  Laterality: Left;  . BILATERAL TOTAL MASTECTOMY WITH AXILLARY LYMPH NODE DISSECTION Bilateral 04/27/2017   Procedure: SKIN SPARING MASTECTOMY;  Surgeon: [name], MD;  Location: [place];  Service: General;  Laterality: Bilateral;  . BREAST CAPSULECTOMY WITH IMPLANT EXCHANGE Bilateral 12/06/2017    Procedure: BREAST CAPSULECTOMY WITH IMPLANT EXCHANGE;  Surgeon: Wallace Going, DO;  Location: [place];  Service: Plastics;  Laterality: Bilateral;  . BREAST RECONSTRUCTION WITH PLACEMENT OF TISSUE EXPANDER AND FLEX HD (ACELLULAR HYDRATED DERMIS) Bilateral 04/27/2017   Procedure: BILATERAL BREAST RECONSTRUCTION WITH PLACEMENT OF TISSUE EXPANDER AND FLEX HD (ACELLULAR HYDRATED DERMIS);  Surgeon: Wallace Going, DO;  Location: [place];  Service: Plastics;  Laterality: Bilateral;  . PLACEMENT OF BREAST IMPLANTS Bilateral 12/06/2017   Procedure: PLACEMENT OF BREAST IMPLANTS;  Surgeon: Wallace Going, DO;  Location: [place];  Service: Plastics;  Laterality: Bilateral;  . PORT-A-CATH REMOVAL Right 12/06/2017   Procedure: REMOVAL PORT-A-CATH;  Surgeon: Wallace Going, DO;  Location: [place];  Service: Plastics;  Laterality: Right;  . PORTACATH PLACEMENT Right 06/01/2017   Procedure: INSERTION PORT-A-CATH RIGHT SUBCLAVIAN;  Surgeon: [name], MD;  Location: [place];  Service: General;  Laterality: Right;  . ROBOTIC ASSISTED SALPINGO OOPHERECTOMY Bilateral 08/31/2018   Procedure: XI ROBOTIC ASSISTED BILATERAL  SALPINGO OOPHORECTOMY;  Surgeon: Everitt Amber, MD;  Location: [facility];  Service: Gynecology;  Laterality: Bilateral;  . WISDOM TOOTH EXTRACTION      I have reviewed the social history and family history with the patient and they are unchanged  from previous note.  ALLERGIES:  is allergic to clindamycin, contrast media [iodinated diagnostic agents], penicillins, and codeine.  MEDICATIONS:  Current Outpatient Medications  Medication Sig Dispense Refill  . ascorbic acid (VITAMIN C) 500 MG tablet Take by mouth.    Marland Kitchen b complex vitamins capsule Take 1 capsule by mouth daily.    . Cholecalciferol (VITAMIN D-3 PO) Take 1 capsule by mouth daily.    Marland Kitchen MAGNESIUM PO Take 1 tablet by mouth daily.  Take Muscle cramps    . Misc Natural Products (CALCIUM PLUS ADVANCED PO) Take 1 tablet by mouth daily.    . Multiple Vitamins-Calcium (ONE-A-DAY WOMENS PO) Take 1 tablet by mouth daily.    Marland Kitchen OVER THE COUNTER MEDICATION Take 1 capsule by mouth daily. Biotin/MSM    . ALPRAZolam (XANAX) 0.25 MG tablet Take 1 tablet (0.25 mg total) by mouth at bedtime as needed for anxiety. (Patient not taking: No sig reported) 30 tablet 0  . azithromycin (ZITHROMAX Z-PAK) 250 MG tablet Take as prescribed (Patient not taking: No sig reported) 6 each 0  . gabapentin (NEURONTIN) 100 MG capsule Take 3 capsules (300 mg total) by mouth at bedtime. 90 capsule 5  . ibuprofen (ADVIL,MOTRIN) 800 MG tablet Take 1 tablet (800 mg total) by mouth every 8 (eight) hours as needed. (Patient not taking: No sig reported) 30 tablet 0  . methylPREDNISolone (MEDROL DOSEPAK) 4 MG TBPK tablet Take as prescribed (Patient not taking: No sig reported) 21 tablet 0  . tamoxifen (NOLVADEX) 20 MG tablet Take 1 tablet (20 mg total) by mouth daily. 90 tablet 3   No current facility-administered medications for this visit.   Facility-Administered Medications Ordered in Other Visits  Medication Dose Route Frequency Provider Last Rate Last Admin  . sodium chloride flush (NS) 0.9 % injection 10 mL  10 mL Intravenous PRN [name], MD   10 mL at 07/21/17 1323    PHYSICAL EXAMINATION: ECOG PERFORMANCE STATUS: 0 - Asymptomatic  Vitals:   11/03/20 1040  BP: 116/74  Pulse: 77  Resp: 16  Temp: 98.5 F (36.9 C)  SpO2: 100%   Filed Weights   11/03/20 1040  Weight: 127 lb 8 oz (57.8 kg)    GENERAL:alert, no distress and comfortable SKIN: No rash EYES: sclera clear NECK: Without mass LYMPH:  no palpable cervical or supraclavicular lymphadenopathy  LUNGS:  normal breathing effort HEART: no lower extremity edema Musculoskeletal: Positional focal tenderness in the left lumbar spine NEURO: alert & oriented x 3 with fluent speech, no focal  motor/sensory deficits Breast: S/p bilateral mastectomy with reconstruction.  Incisions completely healed.  Small movable lymph node palpable in the right low axilla.  No other appreciable mass or nodularity in the chest wall or left axilla.  LABORATORY DATA:  I have reviewed the data as listed CBC Latest Ref Rng & Units 11/03/2020 04/27/2020 11/28/2019  WBC 4.0 - 10.5 K/uL 5.4 5.2 5.7  Hemoglobin 12.0 - 15.0 g/dL 13.4 13.2 14.6  Hematocrit 36.0 - 46.0 % 40.1 39.6 44.0  Platelets 150 - 400 K/uL 168 172 195     CMP Latest Ref Rng & Units 11/03/2020 04/27/2020 11/28/2019  Glucose 70 - 99 mg/dL 85 148(H) 98  BUN 6 - 20 mg/dL _0 Creatinine 0.44 - 1.00 mg/dL 1.01(H) 1.07(H) 0.96  Sodium 135 - 145 mmol/L 140 139 142  Potassium 3.5 - 5.1 mmol/L 4.6 4.1 4.3  Chloride 98 - 111 mmol/L 106 105 107  CO2 22 - 32  mmol/L _0 Calcium 8.9 - 10.3 mg/dL 9.1 9.4 9.2  Total Protein 6.5 - 8.1 g/dL 6.2(L) 6.3(L) 7.1  Total Bilirubin 0.3 - 1.2 mg/dL 0.3 0.2(L) 0.3  Alkaline Phos 38 - 126 U/L 54 56 61  AST 15 - 41 U/L 13(L) 11(L) 16  ALT 0 - 44 U/L _1 RADIOGRAPHIC STUDIES: I have personally reviewed the radiological images as listed and agreed with the findings in the report. No results found.   ASSESSMENT & PLAN: 50 yo BRCA2 + female   1. 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 BRCA 2 +. Treated withb/l mastectomy, adjuvant chemo and radiation. -She started antiestrogen with tamoxifen in 03/2018, s/p BSO for BRCA2(+) she changed from tamoxifen to AI in 09/2018, but had allergic reaction and switched back to tamoxifen, tolerating well -continues surveillance   2. BRCA 2+ mutation  -S/p bilateral mastectomy and BSO (09/2018) per Dr. Denman George  -One of her children is 21, ready to proceed with testing  3. Chronic ITP -Found during the pregnancy of her last  child -CBC normal  Disposition: Madison Savage is clinically doing well.  She continues to tolerate tamoxifen without significant toxicities. Labs unremarkable. Breast exam benign with a small palpable right axillary lymph node which has decreased from prior exams.  This is likely reactive from COVID-19 booster versus recent respiratory infection, it does not appear pathologically enlarged.  The clinical suspicion for malignancy is very low.  However given her history I am referring her for right axillary ultrasound to establish new baseline.  No other clinical concern for recurrence.  Her son Madison Savage is 23, she feels he has reached the age of maturity for genetic testing and is requesting to have him referred to a Dietitian.  Referral has been placed.  We discussed health maintenance and disease prevention.  She is agreeable to GI referral for her for first screening colonoscopy.  She wants to talk to Dr. Denman George about indication to complete hysterectomy. I sent a message to gyn/onc.  She will continue tamoxifen and breast cancer surveillance. Routine lab and follow up in 6 months.     Orders Placed This Encounter  Procedures  . [place] AXILLA RIGHT    h/o left breast cancer T1cN2 s/p bilateral mastectomy. palpable right ax node. Recent URI 10/2019 and covid booster 04/2020    Standing Status:   Future    Standing Expiration Date:   11/03/2021    Order Specific Question:   Reason for Exam (SYMPTOM  OR DIAGNOSIS REQUIRED)    Answer:   h/o left breast cancer T1cN2 s/p bilateral mastectomy. palpable right ax node    Order Specific Question:   Preferred imaging location?    Answer:   [facility]  . Ambulatory referral to Gastroenterology    Referral Priority:   Routine    Referral Type:   Consultation    Referral Reason:   Specialty Services Required    Number of Visits Requested:   1   All questions were answered. The patient knows to call the clinic with any problems, questions or  concerns. No barriers to learning were detected. Total encounter time was 35 minutes.      Madison Feeling, NP 11/03/20

## 2020-11-04 LAB — CANCER ANTIGEN 27.29: CA 27.29: <3.5 U/mL (ref 0.0–38.6)

## 2020-11-09 ENCOUNTER — Encounter: Payer: Self-pay | Admitting: Nurse Practitioner

## 2020-12-01 ENCOUNTER — Ambulatory Visit
Admission: RE | Admit: 2020-12-01 | Discharge: 2020-12-01 | Disposition: A | Discharge status: Home / Self Care | Payer: BC Managed Care – PPO | Source: Ambulatory Visit | Attending: Nurse Practitioner | Admitting: Nurse Practitioner

## 2020-12-01 ENCOUNTER — Other Ambulatory Visit: Payer: Self-pay

## 2020-12-01 DIAGNOSIS — C50412 Malignant neoplasm of upper-outer quadrant of left female breast: Secondary | ICD-10-CM

## 2020-12-01 DIAGNOSIS — N6331 Unspecified lump in axillary tail of the right breast: Secondary | ICD-10-CM | POA: Diagnosis not present

## 2020-12-01 IMAGING — US US AXILLARY RIGHT
1 series · 5 of 5 positions shown · non-contrast
Comparison: None.

CLINICAL DATA: Patient presents with complaints episodes a small
lump in the inferior aspect of the right axilla becoming larger and
tender associated with the [[place]] vaccination in booster. She
still feels this small lump, which is persistently tender. History
of bilateral mastectomies for left breast carcinoma, including
several positive left axillary lymph nodes.

EXAM:
ULTRASOUND OF THE RIGHT AXILLA

[Series 1: us axillary right · 0.06mm/px · 5 of 5 slices shown]
[im 1/5]
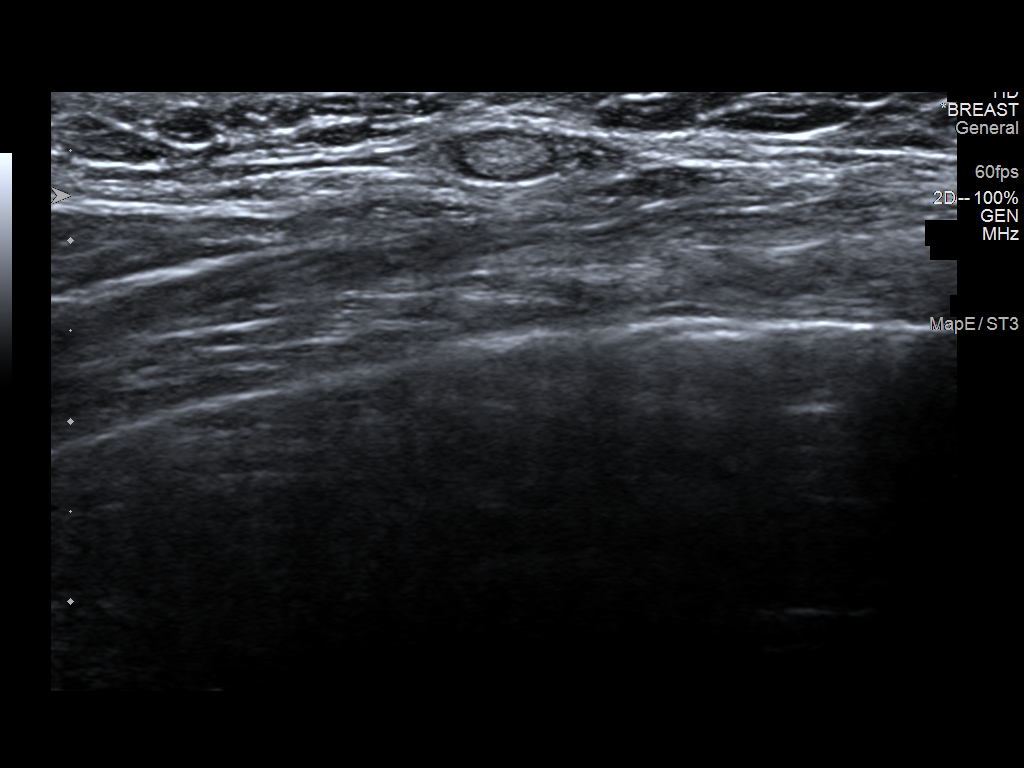
[im 2/5]
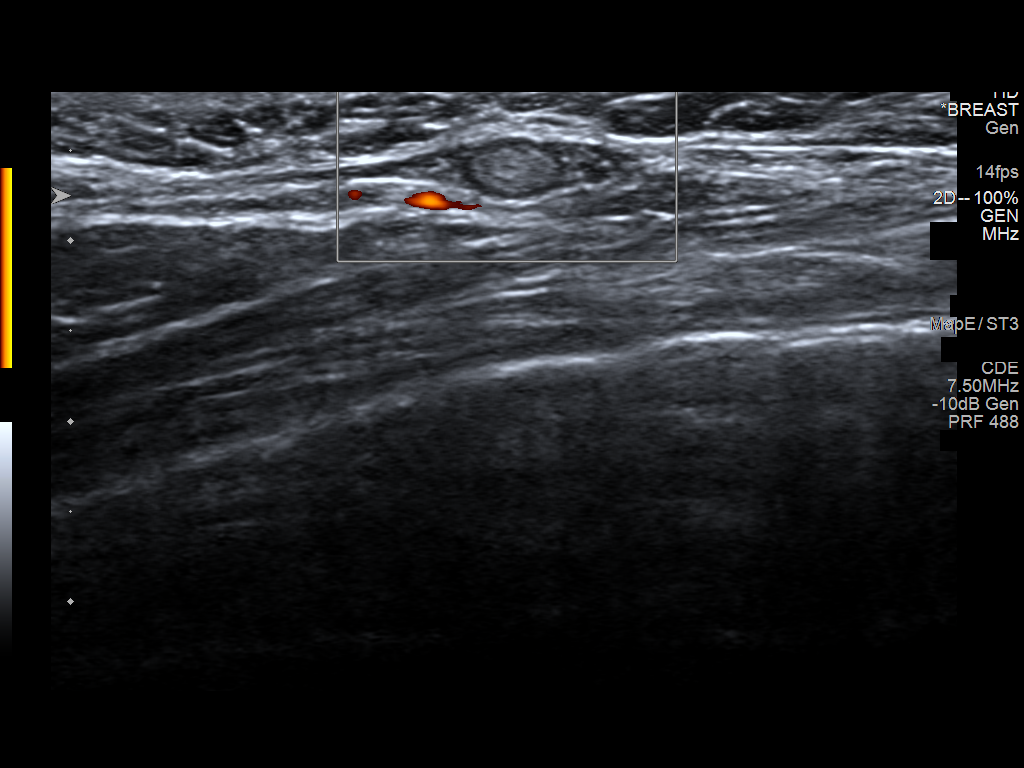
[im 3/5]
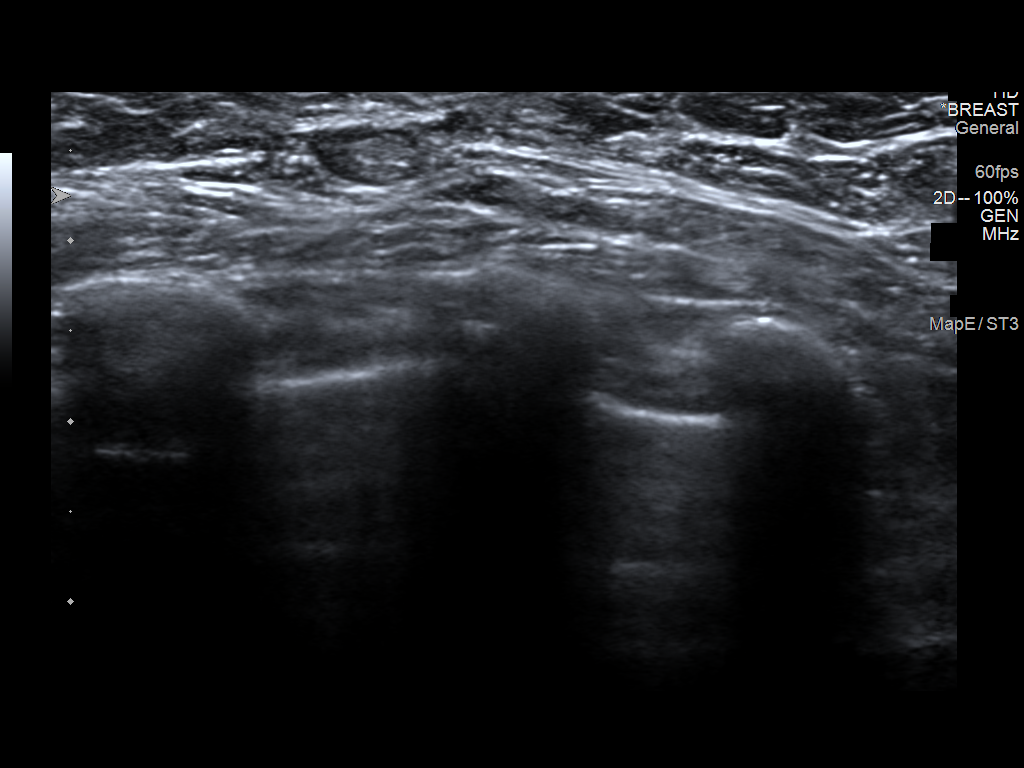
[im 4/5]
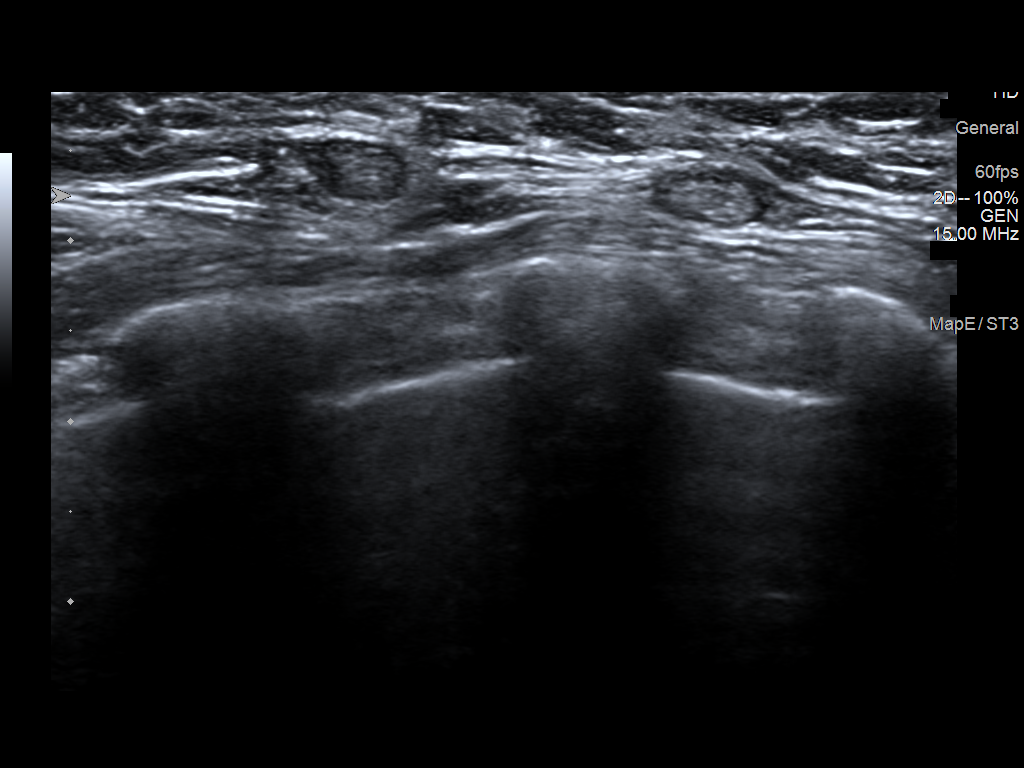
[im 5/5]
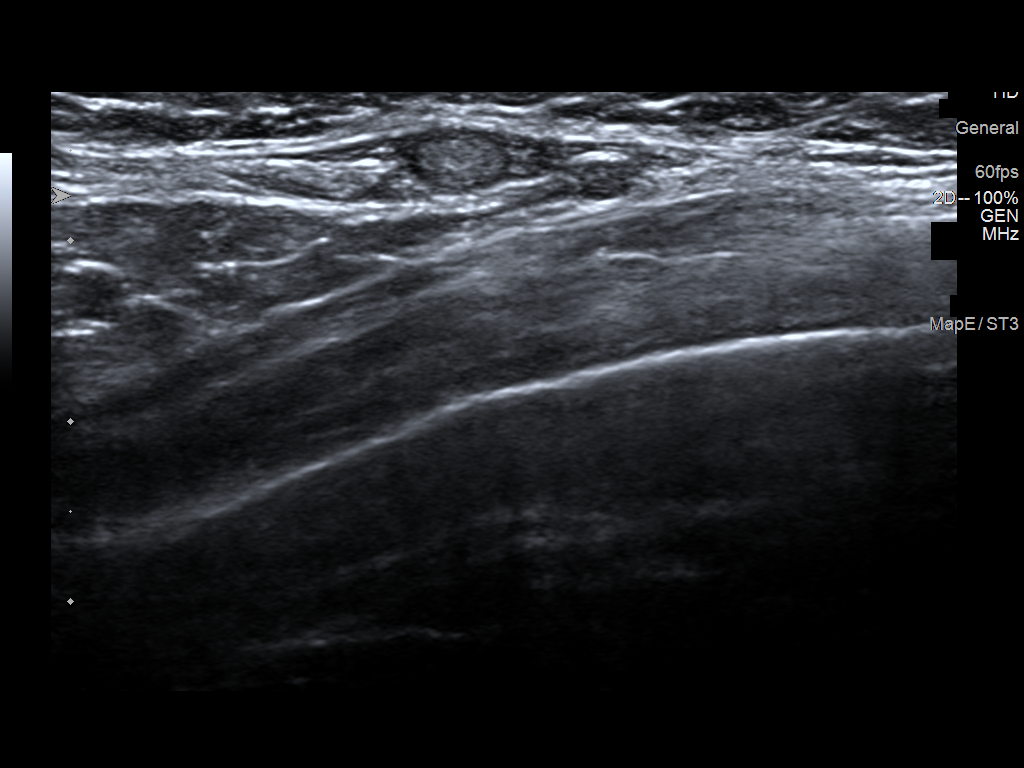

[5 of 5 positions shown; findings below may reference images not displayed]

FINDINGS: On physical exam,there is a small mobile rubbery nodule along the
inferior aspect of the right axilla.

Ultrasound is performed, showing a normal sized, normal morphology
lymph node in the inferior right axilla, cortical thickness of 1 mm,
which corresponds to the area of the palpable abnormality. There is
a second similar appearing normal lymph node adjacent to this. No
masses. No enlarged or abnormal appearing lymph nodes.
IMPRESSION: 1. No evidence of breast malignancy. No enlarged or abnormal lymph
nodes. Palpable abnormality corresponds to a normal sized, normal
morphology inferior right axilla lymph node.

RECOMMENDATION:
1. Clinical follow-up with possible repeat imaging if the palpable
abnormality appears to be enlarging.

I have discussed the findings and recommendations with the patient.
If applicable, a reminder letter will be sent to the patient
regarding the next appointment.

BI-RADS CATEGORY  1: Negative.

## 2021-02-12 ENCOUNTER — Ambulatory Visit
Admission: RE | Admit: 2021-02-12 | Discharge: 2021-02-12 | Disposition: A | Discharge status: Home / Self Care | Payer: BC Managed Care – PPO | Source: Ambulatory Visit | Attending: Hematology | Admitting: Hematology

## 2021-02-12 ENCOUNTER — Other Ambulatory Visit: Payer: Self-pay

## 2021-02-12 DIAGNOSIS — E2839 Other primary ovarian failure: Secondary | ICD-10-CM

## 2021-02-12 DIAGNOSIS — M85852 Other specified disorders of bone density and structure, left thigh: Secondary | ICD-10-CM | POA: Diagnosis not present

## 2021-03-02 ENCOUNTER — Encounter: Payer: Self-pay | Admitting: Nurse Practitioner

## 2021-04-18 IMAGING — CR DG FOREARM 2V*R*
2 series · 2 of 2 positions shown · non-contrast
Comparison: None.

CLINICAL DATA: Dog bite.  Redness and swelling

EXAM:
RIGHT FOREARM - 2 VIEW

[forearm ap]
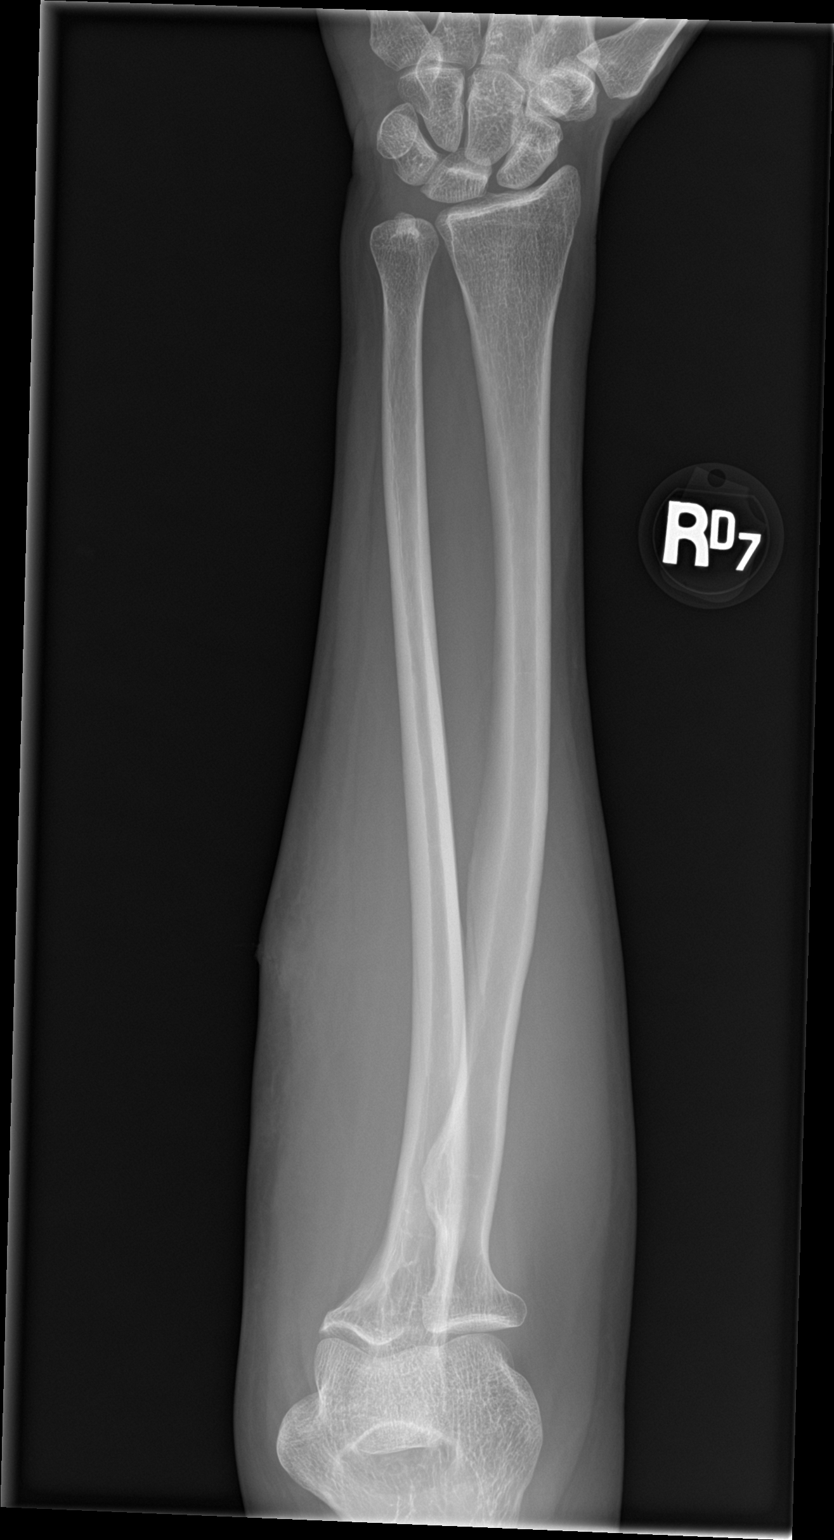

[forearm lat]
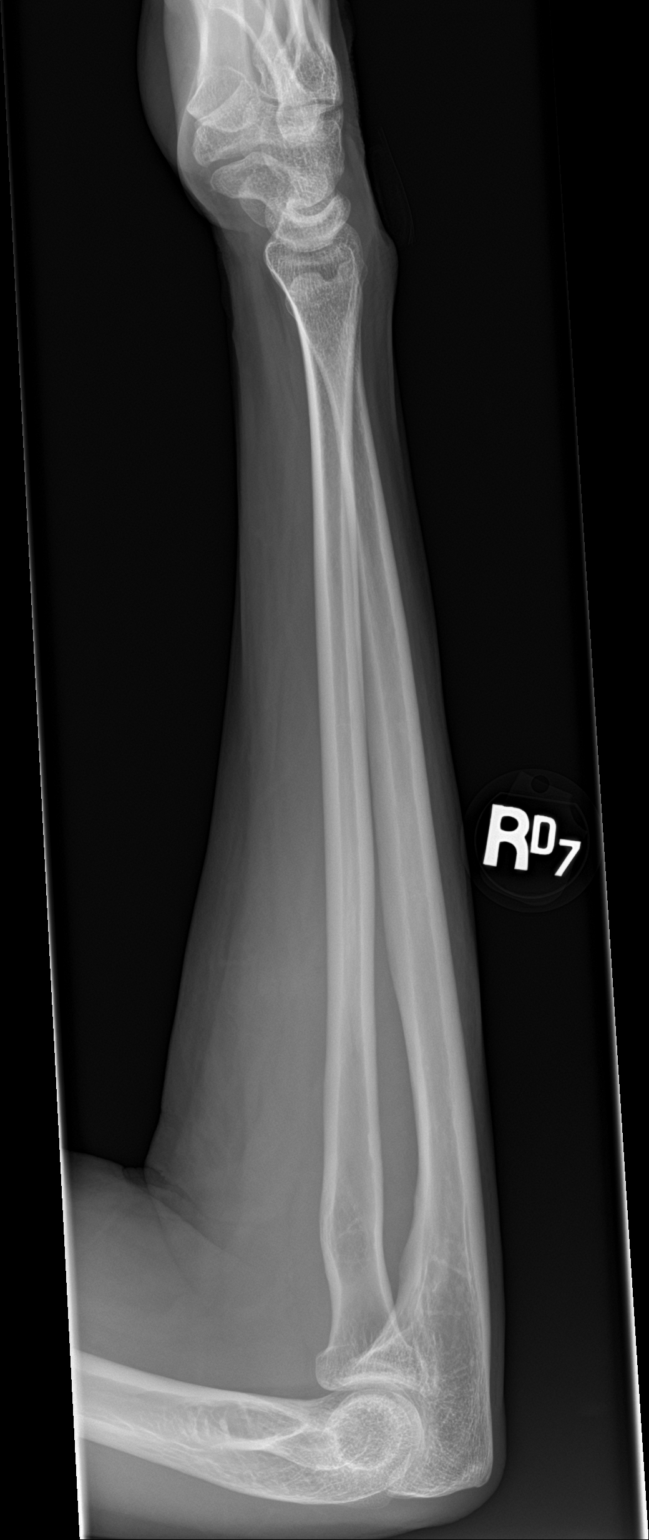

[2 of 2 positions shown; findings below may reference images not displayed]

FINDINGS: Negative for fracture.  No evidence of osteomyelitis

Soft tissue swelling medial mid forearm. No foreign body or gas in
the soft tissues.
IMPRESSION: Soft tissue swelling.  No fracture or osteomyelitis.

## 2021-05-10 NOTE — Progress Notes (Signed)
[facility]   [phone] [phone]   Clinic Follow up Note   Patient Care Team: Dorena Cookey, MD (Inactive) as PCP - General [name], MD as Consulting Physician (General Surgery) [name], MD as Consulting Physician (Hematology) Kyung Rudd, MD as Consulting Physician (Radiation Oncology) Brien Few, MD as Consulting Physician (Obstetrics and Gynecology) Gardenia Phlegm, NP as Nurse Practitioner (Hematology and Oncology) 05/12/2021  CHIEF COMPLAINT: Follow up left breast cancer, BRCA2+  SUMMARY OF ONCOLOGIC HISTORY: Oncology History Overview Note  Cancer Staging Breast cancer metastasized to axillary lymph node, left ([place]) Staging form: Breast, AJCC 8th Edition - Clinical stage from 03/22/2017: Stage Unknown (cTX, cN1, cM0, GX, ER: Positive, PR: Positive, HER2: Negative) - Signed by [name], MD on 03/29/2017 - Pathologic stage from 04/27/2017: Stage IB (pT1c, pN2a, cM0, G2, ER: Positive, PR: Positive, HER2: Negative) - Signed by [name], MD on 05/25/2017 \   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 ([place])  03/21/2017 Mammogram   Diagnostic mammogram bilateral 03/21/17 IMPRESSION: INCOMPLETE - ADDITIONAL IMAGING EVALUATION NEEDED 1. The 0.6 cm oval mass in the right breast at 11 o'clock middle depth is indeterminate. An ultrasound is recommended.  2. The 0.5cm round focal asymmetry in the left breast central to the nipple middle depth is indeterminate. An ultrasound is recommended.  3. Ultrasound of the palpable abnormalities in the axilla and far left lateral breast is recommended.  4. Multiple clusters of pleomorphic calcifications in the left upper outer breast anterior depth spanning a 2.5 cm area are highly suggestive of malignancy. A stereotactic biopsy is recommended.     03/21/2017 Imaging   US Breast bilateral 03/21/17 IMPRESSION: HIGHLY SUGGESTIVE OF MALIGNANCY 1. Six distinct abnormal left  axillary nodes, 2 of which correspond to the palpable lumps felt by the patient. Findings concerning for metastatic adenopathy. Ultrasound guided biopsy of one of these nodes is recommended.  2. Small 5 mm palpable, oval mass in the left breast 3:00 areolar margin is at an intermediate suspicion. Ultrasound guided biopsy is recommended.  3. Isoechoic 8 mm mass in the right breast 10:00 5 cm from the nipple is also an intermediate suspicion for malignancy, and ultrasound guided biopsy is recommended.  4. Stereotactic guided biopsy for the highly suspicious calcifications in the left breast also recommended as detailed in the mammography report.     03/22/2017 Initial Biopsy   Diagnosis 03/22/17 Lymph node, needle/core biopsy, left axillary node -METASTATIC CARCINOMA, SEE COMMENT Microscopic Comment   03/22/2017 Receptors her2   Lymph node biopsy ER 95% positive, strong staining, PR 5% positive, weak staining, HER-2 negative, with HER2/CEP17 ratio 1.23 and copy #3.95.   03/27/2017 Pathology Results   Diagnosis 03/27/17 1. Breast, left, needle core biopsy -FIBROADENOMATOID NODULE WITH CALCIFICATIONS 2.Breast, left, needle core biopsy -DUCTAL CARCINOMA IN SITU, HIGH GRADE -SEE COMMENT  3. Breast, right, needle, core biopsy -FIBROADENOMA -NO MALIGNANCY IDENTIFIED    03/29/2017 Initial Diagnosis   Breast cancer metastasized to axillary lymph node, left ([place])   04/07/2017 Imaging   CT CAP 04/07/17 IMPRESSION: 1. Several prominent left axillary lymph nodes which may correspond to biopsy proven axillary lymph node metastasis. 2. No thoracic adenopathy identified or evidence of distant metastatic disease.   04/07/2017 Imaging   Bone whole body scan 04/07/17 IMPRESSION: Negative for evidence of osseous metastatic disease.   04/17/2017 Genetic Testing   BRCA2 c.[id number] (p.Glu260Serfs*15) pathogenic mutation identified on the 9 gene STAT panel.  The STAT  Breast cancer panel offered by [facility]  includes sequencing and rearrangement analysis for the following 9 genes:  ATM, BRCA1, BRCA2, CDH1, CHEK2, PALB2, PTEN, STK11 and TP53.   The report date is April 17, 2017.  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  The Hereditary Gene Panel offered by [facility] includes sequencing and/or deletion duplication testing of the following 46 genes: APC, ATM, AXIN2, BARD1, BMPR1A, BRCA1, BRCA2, BRIP1, CDH1, CDKN2A (p14ARF), CDKN2A (p16INK4a), CHEK2, CTNNA1, DICER1, EPCAM (Deletion/duplication testing only), GREM1 (promoter region deletion/duplication testing only), KIT, MEN1, MLH1, MSH2, MSH3, MSH6, MUTYH, NBN, NF1, NHTL1, PALB2, PDGFRA, PMS2, POLD1, POLE, PTEN, RAD50, RAD51C, RAD51D, SDHB, SDHC, SDHD, SMAD4, SMARCA4. STK11, TP53, TSC1, TSC2, and VHL.  The following genes were evaluated for sequence changes only: SDHA and HOXB13 c.251G>A variant only.  The report date is April 29, 2017.    04/27/2017 Surgery   Surgey 04/27/17 BILATERAL SKIN SPARING MASTECTOMY and LEFT AXILLARY LYMPH NODE and BILATERAL BREAST RECONSTRUCTION WITH PLACEMENT OF TISSUE EXPANDER AND FLEX HD (ACELLULAR HYDRATED DERMIS) Bilateral BY Dr. Barry Dienes and Dr. Marla Roe    04/27/2017 Pathology Results    Diagnosis  04/27/17 1. Breast, simple mastectomy, Right - FIBROCYSTIC CHANGES WITH ADENOSIS. - USUAL DUCTAL HYPERPLASIA. - HEALING BIOPSY SITE. - THERE IS NO EVIDENCE OF MALIGNANCY. 2. Breast, simple mastectomy, Left - INVASIVE DUCTAL CARCINOMA, GRADE II/III, SPANNING 1.2 CM. - DUCTAL CARCINOMA IN SITU WITH CALCIFICATIONS, HIGH GRADE. - INVASIVE DUCTAL CARCINOMA IS FOCALLY PRESENT AT THE ANTERIOR MARGIN. - DUCTAL CARCINOMA IN SITU IS BROADLY 0.1 CM TO THE POSTERIOR MARGIN. - SEE ONCOLOGY TABLE BELOW. 3. Lymph nodes, regional resection, Left axillary - METASTATIC CARCINOMA IN 8 OF 21 LYMPH NODES (8/21). - SEE COMMENT    06/09/2017 - 11/16/2017 Chemotherapy   Adriamycin and Cytoxan every 2 weeks, for 4 cycles 06/09/17-07/21/17, followed by weekly Abraxane and carboplatin for 12 weeks starting 08/11/17. Held Carbo with cycle 2 due to poor toleration. Changed to single agent Abraxane for Cycle 3 on 09/08/17. Completed on 11/16/17 with reduced Abraxane due to body aches.    12/06/2017 Surgery   BREAST CAPSULECTOMY WITH IMPLANT EXCHANGE and PLACEMENT OF BREAST IMPLANTS and REMOVAL PORT-A-CATH  by Dr. Marla Roe on 12/06/17   12/18/2017 Survivorship   Survivorship clinic with NP Mendel Ryder    01/03/2018 - 02/19/2018 Radiation Therapy   Radiation therapy 01/03/18 - 02/19/18.   03/2018 -  Anti-estrogen oral therapy   Adjuvant tamoxifen started in July 2019, switched to letrozole started 09/2018 after BSO but due to allergy reaction with hives, ithcing and swelling she d/c and restarted Tamoxifen.     08/31/2018 Surgery   08/31/2018 BSO With Dr. Denman George  Diagnosis 1. Ovary and fallopian tube, right RIGHT OVARY AND RIGHT FALLOPIAN TUBE: - UNREMARKABLE. - NO ENDOMETRIOSIS OR MALIGNANCY. 2. Ovary and fallopian tube, left LEFT OVARY AND LEFT FALLOPIAN TUBE: - UNREMARKABLE. - NO ENDOMETRIOSIS OR MALIGNANCY.     CURRENT THERAPY:  Adjuvant tamoxifen started in July 2019, switched to letrozole started 09/2018 after BSO but due to allergy reaction with hives, itching and swelling she d/c and restarted Tamoxifen in 09/2018.   INTERVAL HISTORY: Madison Savage returns for follow up as scheduled, last seen by me 11/03/2020.  Her son underwent genetic testing and is BRCA2 positive.  She recently separated from her husband and is down 10 pounds due to stress.  She continues tamoxifen, tolerating well without significant bone or joint pain.  Gabapentin helps  hot flashes.  She continues to have tightness in the left pec, seeing a chiropractor.  She has not scheduled a colonoscopy yet, denies changes in her bowel habits, GI/GYN bleeding, signs of  thrombosis, or any other specific concerns.   MEDICAL HISTORY:  Past Medical History:  Diagnosis Date   Anemia    Anxiety    BRCA2 positive    Chronic ITP (idiopathic thrombocytopenia) (H[facility])    Family history of breast cancer    Family history of ovarian cancer    Headache    last migraine...9/24   History of cancer chemotherapy    left breast 06-09-2017  to 11-16-2017   History of cardiac murmur as a child    infant   History of external beam radiation therapy    left breast   01-03-2018  to 02-19-2018   MTHFR mutation    PONV (postoperative nausea and vomiting)    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 [facility]) oncologist-- dr Burr Medico   dx 03-22-2017--- 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/  04-27-2018  s/p  bilateral mastectomy with left sln dissection's with reconstruction,  completed chemothearpy 11-16-2017,  completed radiation 02-19-2018,  started tamoxifen 07/ 2019   Wears contact lenses     SURGICAL HISTORY: Past Surgical History:  Procedure Laterality Date   AXILLARY LYMPH NODE DISSECTION Left 04/27/2017   Procedure: AXILLARY LYMPH NODE DISSECTION;  Surgeon: [name], MD;  Location: [place];  Service: General;  Laterality: Left;   BILATERAL TOTAL MASTECTOMY WITH AXILLARY LYMPH NODE DISSECTION Bilateral 04/27/2017   Procedure: SKIN SPARING MASTECTOMY;  Surgeon: [name], MD;  Location: [place];  Service: General;  Laterality: Bilateral;   BREAST CAPSULECTOMY WITH IMPLANT EXCHANGE Bilateral 12/06/2017   Procedure: BREAST CAPSULECTOMY WITH IMPLANT EXCHANGE;  Surgeon: Wallace Going, DO;  Location: [place];  Service: Plastics;  Laterality: Bilateral;   BREAST RECONSTRUCTION WITH PLACEMENT OF TISSUE EXPANDER AND FLEX HD (ACELLULAR HYDRATED DERMIS) Bilateral 04/27/2017   Procedure: BILATERAL BREAST RECONSTRUCTION WITH PLACEMENT OF TISSUE EXPANDER AND FLEX HD  (ACELLULAR HYDRATED DERMIS);  Surgeon: Wallace Going, DO;  Location: [place];  Service: Plastics;  Laterality: Bilateral;   MASTECTOMY Bilateral    PLACEMENT OF BREAST IMPLANTS Bilateral 12/06/2017   Procedure: PLACEMENT OF BREAST IMPLANTS;  Surgeon: Wallace Going, DO;  Location: [place];  Service: Plastics;  Laterality: Bilateral;   PORT-A-CATH REMOVAL Right 12/06/2017   Procedure: REMOVAL PORT-A-CATH;  Surgeon: Wallace Going, DO;  Location: [place];  Service: Plastics;  Laterality: Right;   PORTACATH PLACEMENT Right 06/01/2017   Procedure: INSERTION PORT-A-CATH RIGHT SUBCLAVIAN;  Surgeon: [name], MD;  Location: [place];  Service: General;  Laterality: Right;   ROBOTIC ASSISTED SALPINGO OOPHERECTOMY Bilateral 08/31/2018   Procedure: XI ROBOTIC ASSISTED BILATERAL  SALPINGO OOPHORECTOMY;  Surgeon: Everitt Amber, MD;  Location: [place];  Service: Gynecology;  Laterality: Bilateral;   WISDOM TOOTH EXTRACTION      I have reviewed the social history and family history with the patient and they are unchanged from previous note.  ALLERGIES:  is allergic to clindamycin, contrast media [iodinated diagnostic agents], penicillins, and codeine.  MEDICATIONS:  Current Outpatient Medications  Medication Sig Dispense Refill   ALPRAZolam (XANAX) 0.25 MG tablet Take 1 tablet (0.25 mg total) by mouth at bedtime as needed for anxiety. (Patient not taking: No sig reported)  30 tablet 0   ascorbic acid (VITAMIN C) 500 MG tablet Take by mouth.     azithromycin (ZITHROMAX Z-PAK) 250 MG tablet Take as prescribed (Patient not taking: No sig reported) 6 each 0   b complex vitamins capsule Take 1 capsule by mouth daily.     Cholecalciferol (VITAMIN D-3 PO) Take 1 capsule by mouth daily.     gabapentin (NEURONTIN) 100 MG capsule Take 3 capsules (300 mg total) by mouth at bedtime. 90 capsule 5   ibuprofen (ADVIL,MOTRIN) 800 MG  tablet Take 1 tablet (800 mg total) by mouth every 8 (eight) hours as needed. (Patient not taking: No sig reported) 30 tablet 0   MAGNESIUM PO Take 1 tablet by mouth daily. Take Muscle cramps     methylPREDNISolone (MEDROL DOSEPAK) 4 MG TBPK tablet Take as prescribed (Patient not taking: No sig reported) 21 tablet 0   Misc Natural Products (CALCIUM PLUS ADVANCED PO) Take 1 tablet by mouth daily.     Multiple Vitamins-Calcium (ONE-A-DAY WOMENS PO) Take 1 tablet by mouth daily.     OVER THE COUNTER MEDICATION Take 1 capsule by mouth daily. Biotin/MSM     tamoxifen (NOLVADEX) 20 MG tablet Take 1 tablet (20 mg total) by mouth daily. 90 tablet 3   No current facility-administered medications for this visit.   Facility-Administered Medications Ordered in Other Visits  Medication Dose Route Frequency Provider Last Rate Last Admin   sodium chloride flush (NS) 0.9 % injection 10 mL  10 mL Intravenous PRN [name], MD   10 mL at 07/21/17 1323    PHYSICAL EXAMINATION: ECOG PERFORMANCE STATUS: 0 - Asymptomatic  Vitals:   05/12/21 1027  BP: 115/66  Pulse: (!) 57  Resp: 18  Temp: (!) 97.1 F (36.2 C)  SpO2: 98%   Filed Weights   05/12/21 1027  Weight: 117 lb 7 oz (53.3 kg)    GENERAL:alert, no distress and comfortable SKIN: No rash EYES: sclera clear NECK: Without mass LYMPH:  no palpable cervical or supraclavicular lymphadenopathy LUNGS: clear with normal breathing effort HEART: regular rate & rhythm, no lower extremity edema ABDOMEN:abdomen soft, non-tender and normal bowel sounds NEURO: alert & oriented x 3 with fluent speech, no focal motor/sensory deficits Breast exam:   LABORATORY DATA:  I have reviewed the data as listed CBC Latest Ref Rng & Units 05/12/2021 11/03/2020 04/27/2020  WBC 4.0 - 10.5 K/uL 5.3 5.4 5.2  Hemoglobin 12.0 - 15.0 g/dL 14.0 13.4 13.2  Hematocrit 36.0 - 46.0 % 41.4 40.1 39.6  Platelets 150 - 400 K/uL 176 168 172     CMP Latest Ref Rng & Units 05/12/2021  11/03/2020 04/27/2020  Glucose 70 - 99 mg/dL 91 85 148(H)  BUN 6 - 20 mg/dL $Remove'19 16 13  'leWiyGS$ Creatinine 0.44 - 1.00 mg/dL 1.04(H) 1.01(H) 1.07(H)  Sodium 135 - 145 mmol/L 140 140 139  Potassium 3.5 - 5.1 mmol/L 4.0 4.6 4.1  Chloride 98 - 111 mmol/L 106 106 105  CO2 22 - 32 mmol/L $RemoveB'25 26 25  'mVWqHvcf$ Calcium 8.9 - 10.3 mg/dL 10.0 9.1 9.4  Total Protein 6.5 - 8.1 g/dL 6.3(L) 6.2(L) 6.3(L)  Total Bilirubin 0.3 - 1.2 mg/dL 0.3 0.3 0.2(L)  Alkaline Phos 38 - 126 U/L 60 54 56  AST 15 - 41 U/L 12(L) 13(L) 11(L)  ALT 0 - 44 U/L $Remo'9 11 8      'SCNjl$ RADIOGRAPHIC STUDIES: I have personally reviewed the radiological images as listed and agreed with the findings in the  report. No results found.   ASSESSMENT & PLAN: 50 year old female  56. 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 BRCA 2 +. Treated with b/l mastectomy, adjuvant chemo and radiation.  -S/p BSO for BRCA2(+) she changed from tamoxifen to AI, but had allergic reaction and switched back to tamoxifen, tolerating well -Continue surveillance  2.  BRCA2 positive -S/p bilateral mastectomy and BSO (09/24/2018) per Dr. Denman George -Her oldest son Glennon Mac age 106 underwent testing and is BRCA positive, he has met with a genetic counselor  3.  ITP -Found during pregnancy of her youngest child -CBC normal lately  Disposition: Ms. Stumpo is clinically doing well.  Tolerating tamoxifen without significant side effects which she will continue.  Physical exam is benign.  Labs today including vitamin D, CBC, and CMP are normal.  We will follow-up on the pending CA 27.29.  Overall there is no clinical concern for breast cancer recurrence.  She is just over 4 years from initial diagnosis, continue surveillance.  We reviewed health maintenance including Pap testing and colonoscopy, she is due this year.  Next routine lab and follow-up in 6 months, or sooner if  needed.  Orders Placed This Encounter  Procedures   VITAMIN D 25 Hydroxy (Vit-D Deficiency, Fractures)   All questions were answered. The patient knows to call the clinic with any problems, questions or concerns. No barriers to learning were detected.     Alla Feeling, NP 05/12/21

## 2021-05-12 ENCOUNTER — Inpatient Hospital Stay (HOSPITAL_BASED_OUTPATIENT_CLINIC_OR_DEPARTMENT_OTHER): Payer: BC Managed Care – PPO | Admitting: Nurse Practitioner

## 2021-05-12 ENCOUNTER — Other Ambulatory Visit: Payer: Self-pay

## 2021-05-12 ENCOUNTER — Inpatient Hospital Stay: Payer: BC Managed Care – PPO | Attending: Nurse Practitioner

## 2021-05-12 ENCOUNTER — Encounter: Payer: Self-pay | Admitting: Nurse Practitioner

## 2021-05-12 VITALS — BP 115/66 | HR 57 | Temp 97.1°F | Resp 18 | Wt 117.4 lb

## 2021-05-12 DIAGNOSIS — C773 Secondary and unspecified malignant neoplasm of axilla and upper limb lymph nodes: Secondary | ICD-10-CM | POA: Diagnosis not present

## 2021-05-12 DIAGNOSIS — Z9013 Acquired absence of bilateral breasts and nipples: Secondary | ICD-10-CM | POA: Insufficient documentation

## 2021-05-12 DIAGNOSIS — Z1321 Encounter for screening for nutritional disorder: Secondary | ICD-10-CM

## 2021-05-12 DIAGNOSIS — D693 Immune thrombocytopenic purpura: Secondary | ICD-10-CM | POA: Diagnosis not present

## 2021-05-12 DIAGNOSIS — Z79810 Long term (current) use of selective estrogen receptor modulators (SERMs): Secondary | ICD-10-CM | POA: Insufficient documentation

## 2021-05-12 DIAGNOSIS — Z923 Personal history of irradiation: Secondary | ICD-10-CM | POA: Insufficient documentation

## 2021-05-12 DIAGNOSIS — C50412 Malignant neoplasm of upper-outer quadrant of left female breast: Secondary | ICD-10-CM

## 2021-05-12 DIAGNOSIS — Z90722 Acquired absence of ovaries, bilateral: Secondary | ICD-10-CM | POA: Insufficient documentation

## 2021-05-12 DIAGNOSIS — G629 Polyneuropathy, unspecified: Secondary | ICD-10-CM | POA: Diagnosis not present

## 2021-05-12 DIAGNOSIS — Z7952 Long term (current) use of systemic steroids: Secondary | ICD-10-CM | POA: Insufficient documentation

## 2021-05-12 DIAGNOSIS — C50912 Malignant neoplasm of unspecified site of left female breast: Secondary | ICD-10-CM

## 2021-05-12 DIAGNOSIS — Z170 Estrogen receptor positive status [ER+]: Secondary | ICD-10-CM | POA: Diagnosis not present

## 2021-05-12 LAB — CBC WITH DIFFERENTIAL/PLATELET
Abs Immature Granulocytes: 0.02 10*3/uL (ref 0.00–0.07)
Basophils Absolute: 0 10*3/uL (ref 0.0–0.1)
Basophils Relative: 1 %
Eosinophils Absolute: 0.2 10*3/uL (ref 0.0–0.5)
Eosinophils Relative: 3 %
HCT: 41.4 % (ref 36.0–46.0)
Hemoglobin: 14 g/dL (ref 12.0–15.0)
Immature Granulocytes: 0 %
Lymphocytes Relative: 23 %
Lymphs Abs: 1.2 10*3/uL (ref 0.7–4.0)
MCH: 31.4 pg (ref 26.0–34.0)
MCHC: 33.8 g/dL (ref 30.0–36.0)
MCV: 92.8 fL (ref 80.0–100.0)
Monocytes Absolute: 0.5 10*3/uL (ref 0.1–1.0)
Monocytes Relative: 9 %
Neutro Abs: 3.4 10*3/uL (ref 1.7–7.7)
Neutrophils Relative %: 64 %
Platelets: 176 10*3/uL (ref 150–400)
RBC: 4.46 MIL/uL (ref 3.87–5.11)
RDW: 13 % (ref 11.5–15.5)
WBC: 5.3 10*3/uL (ref 4.0–10.5)
nRBC: 0 % (ref 0.0–0.2)

## 2021-05-12 LAB — COMPREHENSIVE METABOLIC PANEL
ALT: 9 U/L (ref 0–44)
AST: 12 U/L — ABNORMAL LOW (ref 15–41)
Albumin: 3.9 g/dL (ref 3.5–5.0)
Alkaline Phosphatase: 60 U/L (ref 38–126)
Anion gap: 9 (ref 5–15)
BUN: 19 mg/dL (ref 6–20)
CO2: 25 mmol/L (ref 22–32)
Calcium: 10 mg/dL (ref 8.9–10.3)
Chloride: 106 mmol/L (ref 98–111)
Creatinine, Ser: 1.04 mg/dL — ABNORMAL HIGH (ref 0.44–1.00)
GFR, Estimated: >60 mL/min (ref >60)
Glucose, Bld: 91 mg/dL (ref 70–99)
Potassium: 4 mmol/L (ref 3.5–5.1)
Sodium: 140 mmol/L (ref 135–145)
Total Bilirubin: 0.3 mg/dL (ref 0.3–1.2)
Total Protein: 6.3 g/dL — ABNORMAL LOW (ref 6.5–8.1)

## 2021-05-12 LAB — VITAMIN D 25 HYDROXY (VIT D DEFICIENCY, FRACTURES): Vit D, 25-Hydroxy: 50.39 ng/mL (ref 30–100)

## 2021-05-12 MED ORDER — GABAPENTIN 100 MG PO CAPS: 300.0000 mg | ORAL_CAPSULE | Freq: Every day | ORAL | 5 refills | Status: DC

## 2021-05-13 ENCOUNTER — Telehealth: Payer: Self-pay | Admitting: Hematology

## 2021-05-13 LAB — CANCER ANTIGEN 27.29: CA 27.29: 4.6 U/mL (ref 0.0–38.6)

## 2021-05-13 NOTE — Telephone Encounter (Signed)
Scheduled follow-up appointment per 9/7 los. Patient is aware. 

## 2021-07-14 ENCOUNTER — Encounter: Payer: Self-pay | Admitting: Nurse Practitioner

## 2021-08-02 DIAGNOSIS — J329 Chronic sinusitis, unspecified: Secondary | ICD-10-CM | POA: Diagnosis not present

## 2021-08-11 DIAGNOSIS — R059 Cough, unspecified: Secondary | ICD-10-CM | POA: Diagnosis not present

## 2021-08-11 DIAGNOSIS — J019 Acute sinusitis, unspecified: Secondary | ICD-10-CM | POA: Diagnosis not present

## 2021-10-15 DIAGNOSIS — J301 Allergic rhinitis due to pollen: Secondary | ICD-10-CM | POA: Diagnosis not present

## 2021-10-15 DIAGNOSIS — Z23 Encounter for immunization: Secondary | ICD-10-CM | POA: Diagnosis not present

## 2021-11-08 ENCOUNTER — Inpatient Hospital Stay: Payer: BC Managed Care – PPO | Attending: Hematology | Admitting: Hematology

## 2021-11-08 ENCOUNTER — Inpatient Hospital Stay: Payer: BC Managed Care – PPO

## 2021-11-08 ENCOUNTER — Encounter: Payer: Self-pay | Admitting: Hematology

## 2021-11-08 ENCOUNTER — Other Ambulatory Visit: Payer: Self-pay

## 2021-11-08 VITALS — BP 122/79 | HR 70 | Temp 98.4°F | Resp 19 | Ht 68.0 in | Wt 124.4 lb

## 2021-11-08 DIAGNOSIS — C773 Secondary and unspecified malignant neoplasm of axilla and upper limb lymph nodes: Secondary | ICD-10-CM | POA: Diagnosis not present

## 2021-11-08 DIAGNOSIS — Z9013 Acquired absence of bilateral breasts and nipples: Secondary | ICD-10-CM | POA: Insufficient documentation

## 2021-11-08 DIAGNOSIS — C50412 Malignant neoplasm of upper-outer quadrant of left female breast: Secondary | ICD-10-CM | POA: Diagnosis not present

## 2021-11-08 DIAGNOSIS — D693 Immune thrombocytopenic purpura: Secondary | ICD-10-CM | POA: Insufficient documentation

## 2021-11-08 DIAGNOSIS — Z923 Personal history of irradiation: Secondary | ICD-10-CM | POA: Insufficient documentation

## 2021-11-08 DIAGNOSIS — Z170 Estrogen receptor positive status [ER+]: Secondary | ICD-10-CM | POA: Diagnosis not present

## 2021-11-08 DIAGNOSIS — M8580 Other specified disorders of bone density and structure, unspecified site: Secondary | ICD-10-CM | POA: Diagnosis not present

## 2021-11-08 DIAGNOSIS — Z79810 Long term (current) use of selective estrogen receptor modulators (SERMs): Secondary | ICD-10-CM | POA: Diagnosis not present

## 2021-11-08 DIAGNOSIS — Z79899 Other long term (current) drug therapy: Secondary | ICD-10-CM | POA: Diagnosis not present

## 2021-11-08 DIAGNOSIS — G629 Polyneuropathy, unspecified: Secondary | ICD-10-CM | POA: Diagnosis not present

## 2021-11-08 DIAGNOSIS — Z1321 Encounter for screening for nutritional disorder: Secondary | ICD-10-CM

## 2021-11-08 DIAGNOSIS — C50912 Malignant neoplasm of unspecified site of left female breast: Secondary | ICD-10-CM

## 2021-11-08 LAB — COMPREHENSIVE METABOLIC PANEL
ALT: 11 U/L (ref 0–44)
AST: 14 U/L — ABNORMAL LOW (ref 15–41)
Albumin: 4 g/dL (ref 3.5–5.0)
Alkaline Phosphatase: 49 U/L (ref 38–126)
Anion gap: 5 (ref 5–15)
BUN: 13 mg/dL (ref 6–20)
CO2: 29 mmol/L (ref 22–32)
Calcium: 10.4 mg/dL — ABNORMAL HIGH (ref 8.9–10.3)
Chloride: 105 mmol/L (ref 98–111)
Creatinine, Ser: 1.07 mg/dL — ABNORMAL HIGH (ref 0.44–1.00)
GFR, Estimated: >60 mL/min (ref >60)
Glucose, Bld: 99 mg/dL (ref 70–99)
Potassium: 4.7 mmol/L (ref 3.5–5.1)
Sodium: 139 mmol/L (ref 135–145)
Total Bilirubin: 0.4 mg/dL (ref 0.3–1.2)
Total Protein: 6.3 g/dL — ABNORMAL LOW (ref 6.5–8.1)

## 2021-11-08 LAB — CBC WITH DIFFERENTIAL/PLATELET
Abs Immature Granulocytes: 0.01 10*3/uL (ref 0.00–0.07)
Basophils Absolute: 0 10*3/uL (ref 0.0–0.1)
Basophils Relative: 1 %
Eosinophils Absolute: 0.2 10*3/uL (ref 0.0–0.5)
Eosinophils Relative: 3 %
HCT: 40.9 % (ref 36.0–46.0)
Hemoglobin: 13.7 g/dL (ref 12.0–15.0)
Immature Granulocytes: 0 %
Lymphocytes Relative: 26 %
Lymphs Abs: 1.4 10*3/uL (ref 0.7–4.0)
MCH: 31.1 pg (ref 26.0–34.0)
MCHC: 33.5 g/dL (ref 30.0–36.0)
MCV: 92.7 fL (ref 80.0–100.0)
Monocytes Absolute: 0.5 10*3/uL (ref 0.1–1.0)
Monocytes Relative: 10 %
Neutro Abs: 3.2 10*3/uL (ref 1.7–7.7)
Neutrophils Relative %: 60 %
Platelets: 175 10*3/uL (ref 150–400)
RBC: 4.41 MIL/uL (ref 3.87–5.11)
RDW: 12.5 % (ref 11.5–15.5)
WBC: 5.4 10*3/uL (ref 4.0–10.5)
nRBC: 0 % (ref 0.0–0.2)

## 2021-11-08 MED ORDER — TAMOXIFEN CITRATE 20 MG PO TABS: 20.0000 mg | ORAL_TABLET | Freq: Every day | ORAL | 3 refills | Status: DC

## 2021-11-08 MED ORDER — GABAPENTIN 100 MG PO CAPS: 300.0000 mg | ORAL_CAPSULE | Freq: Every day | ORAL | 5 refills | Status: DC

## 2021-11-08 NOTE — Progress Notes (Signed)
[place]   Telephone:([phone]) [phone] Fax:(336) [phone]   Clinic Follow up Note   Patient Care Team: Michael Boston, MD as PCP - General (Internal Medicine) [name], MD as Consulting Physician (General Surgery) [name], MD as Consulting Physician (Hematology) Kyung Rudd, MD as Consulting Physician (Radiation Oncology) Brien Few, MD as Consulting Physician (Obstetrics and Gynecology) Delice Bison Charlestine Massed, NP as Nurse Practitioner (Hematology and Oncology)  Date of Service:  11/08/2021  CHIEF COMPLAINT: f/u of left breast cancer, BRCA2+  CURRENT THERAPY:  Tamoxifen, started 03/2018  ASSESSMENT & PLAN:  Madison Savage is a 50 y.o. female with   1. 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 BRCA 2 +. Treated with b/l mastectomy, adjuvant chemo and radiation.  -S/p BSO for BRCA2(+) in 08/2018. She changed from tamoxifen to AI, but had allergic reaction and switched back to tamoxifen, tolerating well -she is clinically doing well from a breast cancer standpoint. Labs reviewed, overall stable. Physical exam was unremarkable. There is no clinical concern for recurrence. -Continue surveillance   2.  BRCA2 positive -S/p bilateral mastectomy and BSO (09/24/18 per Dr. Denman George) -Her oldest son Madison Savage age 10 underwent testing and is BRCA positive, he has met with a genetic counselor  3. Osteopenia -DEXA on 02/12/21 showed osteopenia (T-score -1.6 at left femur neck). Will monitor every 2 years. -tamoxifen will help strengthen her bones.   4. Allergy symptoms -continue antihistamine med and nasal spray -f/u with PCP   PLAN: -continue tamoxifen -lab and f/u with NP Lacie in 6 months   No problem-specific Assessment & Plan notes found for this encounter.   SUMMARY OF ONCOLOGIC HISTORY: Oncology History Overview Note  Cancer  Staging Breast cancer metastasized to axillary lymph node, left ([name]) Staging form: Breast, AJCC 8th Edition - Clinical stage from 03/22/2017: Stage Unknown (cTX, cN1, cM0, GX, ER: Positive, PR: Positive, HER2: Negative) - Signed by [name], MD on 03/29/2017 - Pathologic stage from 04/27/2017: Stage IB (pT1c, pN2a, cM0, G2, ER: Positive, PR: Positive, HER2: Negative) - Signed by [name], MD on 05/25/2017 \   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 ([name])  03/21/2017 Mammogram   Diagnostic mammogram bilateral 03/21/17 IMPRESSION: INCOMPLETE - ADDITIONAL IMAGING EVALUATION NEEDED 1. The 0.6 cm oval mass in the right breast at 11 o'clock middle depth is indeterminate. An ultrasound is recommended.  2. The 0.5cm round focal asymmetry in the left breast central to the nipple middle depth is indeterminate. An ultrasound is recommended.  3. Ultrasound of the palpable abnormalities in the axilla and far left lateral breast is recommended.  4. Multiple clusters of pleomorphic calcifications in the left upper outer breast anterior depth spanning a 2.5 cm area are highly suggestive of malignancy. A stereotactic biopsy is recommended.     03/21/2017 Imaging   US Breast bilateral 03/21/17 IMPRESSION: HIGHLY SUGGESTIVE OF MALIGNANCY 1. Six distinct abnormal left axillary nodes, 2 of which correspond to the palpable lumps felt by the patient. Findings concerning for metastatic adenopathy. Ultrasound guided biopsy of one of these nodes is recommended.  2. Small 5 mm palpable, oval mass in the left breast 3:00 areolar margin is at an intermediate suspicion. Ultrasound guided biopsy is recommended.  3. Isoechoic 8 mm mass in the right breast 10:00 5 cm from the nipple is also an intermediate suspicion for malignancy, and ultrasound guided biopsy is  recommended.  4. Stereotactic guided biopsy for the highly suspicious calcifications in the left breast also recommended as detailed in the  mammography report.     03/22/2017 Initial Biopsy   Diagnosis 03/22/17 Lymph node, needle/core biopsy, left axillary node -METASTATIC CARCINOMA, SEE COMMENT Microscopic Comment   03/22/2017 Receptors her2   Lymph node biopsy ER 95% positive, strong staining, PR 5% positive, weak staining, HER-2 negative, with HER2/CEP17 ratio 1.23 and copy #3.95.   03/27/2017 Pathology Results   Diagnosis 03/27/17 1. Breast, left, needle core biopsy -FIBROADENOMATOID NODULE WITH CALCIFICATIONS 2.Breast, left, needle core biopsy -DUCTAL CARCINOMA IN SITU, HIGH GRADE -SEE COMMENT  3. Breast, right, needle, core biopsy -FIBROADENOMA -NO MALIGNANCY IDENTIFIED    03/29/2017 Initial Diagnosis   Breast cancer metastasized to axillary lymph node, left ([name])   04/07/2017 Imaging   CT CAP 04/07/17 IMPRESSION: 1. Several prominent left axillary lymph nodes which may correspond to biopsy proven axillary lymph node metastasis. 2. No thoracic adenopathy identified or evidence of distant metastatic disease.   04/07/2017 Imaging   Bone whole body scan 04/07/17 IMPRESSION: Negative for evidence of osseous metastatic disease.   04/17/2017 Genetic Testing   BRCA2 c.[id number] (p.Glu260Serfs*15) pathogenic mutation identified on the 9 gene STAT panel.  The STAT Breast cancer panel offered by [facility] includes sequencing and rearrangement analysis for the following 9 genes:  ATM, BRCA1, BRCA2, CDH1, CHEK2, PALB2, PTEN, STK11 and TP53.   The report date is April 17, 2017.  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  The Hereditary Gene Panel offered by [facility] includes sequencing and/or deletion duplication testing of the following 46 genes: APC, ATM, AXIN2, BARD1, BMPR1A, BRCA1, BRCA2, BRIP1, CDH1, CDKN2A (p14ARF), CDKN2A (p16INK4a), CHEK2, CTNNA1, DICER1, EPCAM (Deletion/duplication testing only), GREM1 (promoter  region deletion/duplication testing only), KIT, MEN1, MLH1, MSH2, MSH3, MSH6, MUTYH, NBN, NF1, NHTL1, PALB2, PDGFRA, PMS2, POLD1, POLE, PTEN, RAD50, RAD51C, RAD51D, SDHB, SDHC, SDHD, SMAD4, SMARCA4. STK11, TP53, TSC1, TSC2, and VHL.  The following genes were evaluated for sequence changes only: SDHA and HOXB13 c.251G>A variant only.  The report date is April 29, 2017.    04/27/2017 Surgery   Surgey 04/27/17 BILATERAL SKIN SPARING MASTECTOMY and LEFT AXILLARY LYMPH NODE and BILATERAL BREAST RECONSTRUCTION WITH PLACEMENT OF TISSUE EXPANDER AND FLEX HD (ACELLULAR HYDRATED DERMIS) Bilateral BY Dr. Barry Dienes and Dr. Marla Roe    04/27/2017 Pathology Results    Diagnosis  04/27/17 1. Breast, simple mastectomy, Right - FIBROCYSTIC CHANGES WITH ADENOSIS. - USUAL DUCTAL HYPERPLASIA. - HEALING BIOPSY SITE. - THERE IS NO EVIDENCE OF MALIGNANCY. 2. Breast, simple mastectomy, Left - INVASIVE DUCTAL CARCINOMA, GRADE II/III, SPANNING 1.2 CM. - DUCTAL CARCINOMA IN SITU WITH CALCIFICATIONS, HIGH GRADE. - INVASIVE DUCTAL CARCINOMA IS FOCALLY PRESENT AT THE ANTERIOR MARGIN. - DUCTAL CARCINOMA IN SITU IS BROADLY 0.1 CM TO THE POSTERIOR MARGIN. - SEE ONCOLOGY TABLE BELOW. 3. Lymph nodes, regional resection, Left axillary - METASTATIC CARCINOMA IN 8 OF 21 LYMPH NODES (8/21). - SEE COMMENT   06/09/2017 - 11/16/2017 Chemotherapy   Adriamycin and Cytoxan every 2 weeks, for 4 cycles 06/09/17-07/21/17, followed by weekly Abraxane and carboplatin for 12 weeks starting 08/11/17. Held Carbo with cycle 2 due to poor toleration. Changed to single agent Abraxane for Cycle 3 on 09/08/17. Completed on 11/16/17 with reduced Abraxane due to body aches.    12/06/2017 Surgery   BREAST CAPSULECTOMY WITH IMPLANT EXCHANGE and PLACEMENT OF BREAST IMPLANTS and REMOVAL PORT-A-CATH  by  Dr. Marla Roe on 12/06/17   12/18/2017 Survivorship   Survivorship clinic with NP Mendel Ryder    01/03/2018 - 02/19/2018 Radiation Therapy   Radiation therapy  01/03/18 - 02/19/18.   03/2018 -  Anti-estrogen oral therapy   Adjuvant tamoxifen started in July 2019, switched to letrozole started 09/2018 after BSO but due to allergy reaction with hives, ithcing and swelling she d/c and restarted Tamoxifen.     08/31/2018 Surgery   08/31/2018 BSO With Dr. Denman George  Diagnosis 1. Ovary and fallopian tube, right RIGHT OVARY AND RIGHT FALLOPIAN TUBE: - UNREMARKABLE. - NO ENDOMETRIOSIS OR MALIGNANCY. 2. Ovary and fallopian tube, left LEFT OVARY AND LEFT FALLOPIAN TUBE: - UNREMARKABLE. - NO ENDOMETRIOSIS OR MALIGNANCY.      INTERVAL HISTORY:  BILLIEJO SORTO is here for a follow up of breast cancer, BRCA2+. She was last seen by NP Lacie on 05/12/21. She presents to the clinic alone. She denies any side effects from tamoxifen. She notes some hair loss/thinning, but she attributes this to stress as well. She reports she has been dealing with sinus issues since 06/2021. She notes she had the flu at one point, then a sinus infection that turned into sinusitis. She reports she had damage to her house in 09/2021. She notes the carpet was ripped up, but there has been no further work done due to insurance issues. 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 She also notes her husband and her mother have both had Covid since her last visit. She notes she has not gotten it yet.   All other systems were reviewed with the patient and are negative.  MEDICAL HISTORY:  Past Medical History:  Diagnosis Date   Anemia    Anxiety    BRCA2 positive    Chronic ITP (idiopathic thrombocytopenia) (HCC)    Family history of breast cancer    Family history of ovarian cancer    Headache    last migraine...9/24   History of cancer chemotherapy    left breast 06-09-2017  to 11-16-2017   History of cardiac murmur as a child    infant   History of external beam radiation therapy    left breast   01-03-2018  to 02-19-2018   MTHFR mutation    PONV (postoperative  nausea and vomiting)    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 [facility]) oncologist-- dr Burr Medico   dx 03-22-2017--- 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/  04-27-2018  s/p  bilateral mastectomy with left sln dissection's with reconstruction,  completed chemothearpy 11-16-2017,  completed radiation 02-19-2018,  started tamoxifen 07/ 2019   Wears contact lenses     SURGICAL HISTORY: Past Surgical History:  Procedure Laterality Date   AXILLARY LYMPH NODE DISSECTION Left 04/27/2017   Procedure: AXILLARY LYMPH NODE DISSECTION;  Surgeon: [name], MD;  Location: [place];  Service: General;  Laterality: Left;   BILATERAL TOTAL MASTECTOMY WITH AXILLARY LYMPH NODE DISSECTION Bilateral 04/27/2017   Procedure: SKIN SPARING MASTECTOMY;  Surgeon: [name], MD;  Location: [place];  Service: General;  Laterality: Bilateral;   BREAST CAPSULECTOMY WITH IMPLANT EXCHANGE Bilateral 12/06/2017   Procedure: BREAST CAPSULECTOMY WITH IMPLANT EXCHANGE;  Surgeon: Wallace Going, DO;  Location: [place];  Service: Plastics;  Laterality: Bilateral;   BREAST RECONSTRUCTION WITH PLACEMENT OF TISSUE EXPANDER AND FLEX HD (ACELLULAR HYDRATED DERMIS) Bilateral 04/27/2017   Procedure: BILATERAL BREAST RECONSTRUCTION WITH  PLACEMENT OF TISSUE EXPANDER AND FLEX HD (ACELLULAR HYDRATED DERMIS);  Surgeon: Wallace Going, DO;  Location: [place];  Service: Plastics;  Laterality: Bilateral;   MASTECTOMY Bilateral    PLACEMENT OF BREAST IMPLANTS Bilateral 12/06/2017   Procedure: PLACEMENT OF BREAST IMPLANTS;  Surgeon: Wallace Going, DO;  Location: [place];  Service: Plastics;  Laterality: Bilateral;   PORT-A-CATH REMOVAL Right 12/06/2017   Procedure: REMOVAL PORT-A-CATH;  Surgeon: Wallace Going, DO;  Location: [place];  Service: Plastics;  Laterality: Right;    PORTACATH PLACEMENT Right 06/01/2017   Procedure: INSERTION PORT-A-CATH RIGHT SUBCLAVIAN;  Surgeon: [name], MD;  Location: [place];  Service: General;  Laterality: Right;   ROBOTIC ASSISTED SALPINGO OOPHERECTOMY Bilateral 08/31/2018   Procedure: XI ROBOTIC ASSISTED BILATERAL  SALPINGO OOPHORECTOMY;  Surgeon: Everitt Amber, MD;  Location: [place];  Service: Gynecology;  Laterality: Bilateral;   WISDOM TOOTH EXTRACTION      I have reviewed the social history and family history with the patient and they are unchanged from previous note.  ALLERGIES:  is allergic to clindamycin, contrast media [iodinated contrast media], penicillins, and codeine.  MEDICATIONS:  Current Outpatient Medications  Medication Sig Dispense Refill   ALPRAZolam (XANAX) 0.25 MG tablet Take 1 tablet (0.25 mg total) by mouth at bedtime as needed for anxiety. (Patient not taking: No sig reported) 30 tablet 0   ascorbic acid (VITAMIN C) 500 MG tablet Take by mouth.     b complex vitamins capsule Take 1 capsule by mouth daily.     Cholecalciferol (VITAMIN D-3 PO) Take 1 capsule by mouth daily.     gabapentin (NEURONTIN) 100 MG capsule Take 3 capsules (300 mg total) by mouth at bedtime. 90 capsule 5   ibuprofen (ADVIL,MOTRIN) 800 MG tablet Take 1 tablet (800 mg total) by mouth every 8 (eight) hours as needed. (Patient not taking: No sig reported) 30 tablet 0   MAGNESIUM PO Take 1 tablet by mouth daily. Take Muscle cramps     Misc Natural Products (CALCIUM PLUS ADVANCED PO) Take 1 tablet by mouth daily.     Multiple Vitamins-Calcium (ONE-A-DAY WOMENS PO) Take 1 tablet by mouth daily.     OVER THE COUNTER MEDICATION Take 1 capsule by mouth daily. Biotin/MSM     tamoxifen (NOLVADEX) 20 MG tablet Take 1 tablet (20 mg total) by mouth daily. 90 tablet 3   No current facility-administered medications for this visit.   Facility-Administered Medications Ordered in Other Visits  Medication Dose Route Frequency  Provider Last Rate Last Admin   sodium chloride flush (NS) 0.9 % injection 10 mL  10 mL Intravenous PRN [name], MD   10 mL at 07/21/17 1323    PHYSICAL EXAMINATION: ECOG PERFORMANCE STATUS: 0 - Asymptomatic  Vitals:   11/08/21 1029  BP: 122/79  Pulse: 70  Resp: 19  Temp: 98.4 F (36.9 C)  SpO2: 100%   Wt Readings from Last 3 Encounters:  11/08/21 124 lb 6.4 oz (56.4 kg)  05/12/21 117 lb 7 oz (53.3 kg)  11/03/20 127 lb 8 oz (57.8 kg)     GENERAL:alert, no distress and comfortable SKIN: skin color, texture, turgor are normal, no rashes or significant lesions EYES: normal, Conjunctiva are pink and non-injected, sclera clear  NECK: supple, thyroid normal size, non-tender, without nodularity LYMPH:  no palpable lymphadenopathy in the cervical, axillary  LUNGS: clear to auscultation and percussion with normal breathing effort HEART: regular rate &  rhythm and no murmurs and no lower extremity edema ABDOMEN:abdomen soft, non-tender and normal bowel sounds Musculoskeletal:no cyanosis of digits and no clubbing  NEURO: alert & oriented x 3 with fluent speech, no focal motor/sensory deficits BREAST: Status post bilateral mastectomy and reconstruction with implants, no palpable mass, nodules or adenopathy bilaterally. Breast exam benign.   LABORATORY DATA:  I have reviewed the data as listed CBC Latest Ref Rng & Units 11/08/2021 05/12/2021 11/03/2020  WBC 4.0 - 10.5 K/uL 5.4 5.3 5.4  Hemoglobin 12.0 - 15.0 g/dL 13.7 14.0 13.4  Hematocrit 36.0 - 46.0 % 40.9 41.4 40.1  Platelets 150 - 400 K/uL 175 176 168     CMP Latest Ref Rng & Units 11/08/2021 05/12/2021 11/03/2020  Glucose 70 - 99 mg/dL 99 91 85  BUN 6 - 20 mg/dL _0 Creatinine 0.44 - 1.00 mg/dL 1.07(H) 1.04(H) 1.01(H)  Sodium 135 - 145 mmol/L 139 140 140  Potassium 3.5 - 5.1 mmol/L 4.7 4.0 4.6  Chloride 98 - 111 mmol/L 105 106 106  CO2 22 - 32 mmol/L _1 Calcium 8.9 - 10.3 mg/dL 10.4(H) 10.0 9.1  Total Protein 6.5 - 8.1  g/dL 6.3(L) 6.3(L) 6.2(L)  Total Bilirubin 0.3 - 1.2 mg/dL 0.4 0.3 0.3  Alkaline Phos 38 - 126 U/L 49 60 54  AST 15 - 41 U/L 14(L) 12(L) 13(L)  ALT 0 - 44 U/L _2 RADIOGRAPHIC STUDIES: I have personally reviewed the radiological images as listed and agreed with the findings in the report. No results found.    No orders of the defined types were placed in this encounter.  All questions were answered. The patient knows to call the clinic with any problems, questions or concerns. No barriers to learning was detected. The total time spent in the appointment was 25 minutes.     [name], MD 11/08/2021   I, Wilburn Mylar, am acting as scribe for [name], MD.   I have reviewed the above documentation for accuracy and completeness, and I agree with the above.

## 2021-11-09 ENCOUNTER — Telehealth: Payer: Self-pay | Admitting: Hematology

## 2021-11-09 LAB — CANCER ANTIGEN 27.29: CA 27.29: 7.8 U/mL (ref 0.0–38.6)

## 2021-11-09 NOTE — Telephone Encounter (Signed)
Left message with follow-up appointment per 3/6 los. ?

## 2021-12-21 DIAGNOSIS — S51811A Laceration without foreign body of right forearm, initial encounter: Secondary | ICD-10-CM | POA: Diagnosis not present

## 2021-12-21 DIAGNOSIS — Z23 Encounter for immunization: Secondary | ICD-10-CM | POA: Diagnosis not present

## 2021-12-21 DIAGNOSIS — Y999 Unspecified external cause status: Secondary | ICD-10-CM | POA: Diagnosis not present

## 2021-12-21 DIAGNOSIS — Z2914 Encounter for prophylactic rabies immune globin: Secondary | ICD-10-CM | POA: Diagnosis not present

## 2021-12-21 DIAGNOSIS — W5581XA Bitten by other mammals, initial encounter: Secondary | ICD-10-CM | POA: Diagnosis not present

## 2021-12-21 DIAGNOSIS — Z203 Contact with and (suspected) exposure to rabies: Secondary | ICD-10-CM | POA: Diagnosis not present

## 2021-12-21 DIAGNOSIS — S51851A Open bite of right forearm, initial encounter: Secondary | ICD-10-CM | POA: Diagnosis not present

## 2021-12-21 DIAGNOSIS — W540XXA Bitten by dog, initial encounter: Secondary | ICD-10-CM | POA: Diagnosis not present

## 2021-12-21 DIAGNOSIS — S41111A Laceration without foreign body of right upper arm, initial encounter: Secondary | ICD-10-CM | POA: Diagnosis not present

## 2021-12-23 ENCOUNTER — Encounter (HOSPITAL_COMMUNITY): Payer: Self-pay | Admitting: Emergency Medicine

## 2021-12-23 ENCOUNTER — Inpatient Hospital Stay (HOSPITAL_COMMUNITY)
Admission: EM | Admit: 2021-12-23 | Discharge: 2021-12-25 | DRG: 815 | Disposition: A | Discharge status: Home / Self Care | Payer: BC Managed Care – PPO | Attending: Internal Medicine | Admitting: Internal Medicine

## 2021-12-23 ENCOUNTER — Other Ambulatory Visit: Payer: Self-pay

## 2021-12-23 DIAGNOSIS — Z880 Allergy status to penicillin: Secondary | ICD-10-CM | POA: Diagnosis not present

## 2021-12-23 DIAGNOSIS — Z1501 Genetic susceptibility to malignant neoplasm of breast: Secondary | ICD-10-CM

## 2021-12-23 DIAGNOSIS — G4700 Insomnia, unspecified: Secondary | ICD-10-CM | POA: Diagnosis present

## 2021-12-23 DIAGNOSIS — Z881 Allergy status to other antibiotic agents status: Secondary | ICD-10-CM

## 2021-12-23 DIAGNOSIS — M8580 Other specified disorders of bone density and structure, unspecified site: Secondary | ICD-10-CM | POA: Diagnosis not present

## 2021-12-23 DIAGNOSIS — L089 Local infection of the skin and subcutaneous tissue, unspecified: Secondary | ICD-10-CM | POA: Diagnosis not present

## 2021-12-23 DIAGNOSIS — Z853 Personal history of malignant neoplasm of breast: Secondary | ICD-10-CM

## 2021-12-23 DIAGNOSIS — L0390 Cellulitis, unspecified: Secondary | ICD-10-CM | POA: Diagnosis present

## 2021-12-23 DIAGNOSIS — Z9221 Personal history of antineoplastic chemotherapy: Secondary | ICD-10-CM | POA: Diagnosis not present

## 2021-12-23 DIAGNOSIS — S41151A Open bite of right upper arm, initial encounter: Secondary | ICD-10-CM | POA: Diagnosis present

## 2021-12-23 DIAGNOSIS — W540XXA Bitten by dog, initial encounter: Secondary | ICD-10-CM

## 2021-12-23 DIAGNOSIS — E872 Acidosis, unspecified: Secondary | ICD-10-CM | POA: Diagnosis not present

## 2021-12-23 DIAGNOSIS — Z885 Allergy status to narcotic agent status: Secondary | ICD-10-CM | POA: Diagnosis not present

## 2021-12-23 DIAGNOSIS — D849 Immunodeficiency, unspecified: Secondary | ICD-10-CM | POA: Diagnosis not present

## 2021-12-23 DIAGNOSIS — C773 Secondary and unspecified malignant neoplasm of axilla and upper limb lymph nodes: Secondary | ICD-10-CM | POA: Diagnosis not present

## 2021-12-23 DIAGNOSIS — Z9013 Acquired absence of bilateral breasts and nipples: Secondary | ICD-10-CM

## 2021-12-23 DIAGNOSIS — Z923 Personal history of irradiation: Secondary | ICD-10-CM

## 2021-12-23 DIAGNOSIS — L03113 Cellulitis of right upper limb: Secondary | ICD-10-CM | POA: Diagnosis not present

## 2021-12-23 DIAGNOSIS — Z79810 Long term (current) use of selective estrogen receptor modulators (SERMs): Secondary | ICD-10-CM | POA: Diagnosis not present

## 2021-12-23 DIAGNOSIS — Z91041 Radiographic dye allergy status: Secondary | ICD-10-CM | POA: Diagnosis not present

## 2021-12-23 LAB — CBC
HCT: 39.1 % (ref 36.0–46.0)
Hemoglobin: 13.3 g/dL (ref 12.0–15.0)
MCH: 31.7 pg (ref 26.0–34.0)
MCHC: 34 g/dL (ref 30.0–36.0)
MCV: 93.1 fL (ref 80.0–100.0)
Platelets: 170 10*3/uL (ref 150–400)
RBC: 4.2 MIL/uL (ref 3.87–5.11)
RDW: 12.6 % (ref 11.5–15.5)
WBC: 8.4 10*3/uL (ref 4.0–10.5)
nRBC: 0 % (ref 0.0–0.2)

## 2021-12-23 LAB — BASIC METABOLIC PANEL
Anion gap: 5 (ref 5–15)
BUN: 14 mg/dL (ref 6–20)
CO2: 21 mmol/L — ABNORMAL LOW (ref 22–32)
Calcium: 8.6 mg/dL — ABNORMAL LOW (ref 8.9–10.3)
Chloride: 110 mmol/L (ref 98–111)
Creatinine, Ser: 1.03 mg/dL — ABNORMAL HIGH (ref 0.44–1.00)
GFR, Estimated: >60 mL/min (ref >60)
Glucose, Bld: 108 mg/dL — ABNORMAL HIGH (ref 70–99)
Potassium: 4 mmol/L (ref 3.5–5.1)
Sodium: 136 mmol/L (ref 135–145)

## 2021-12-23 NOTE — ED Provider Triage Note (Signed)
Emergency Medicine Provider Triage Evaluation Note ? ?Madison Savage , a 51 y.o. female  was evaluated in triage.  Pt complains of redness, pain, and itching spreading from a dog bite.  Patient was bit by dog on April 18 and was treated at [facility].  The patient's wound was on the right forearm and was sutured.  The patient was prescribed Flagyl and Bactrim.  Today the patient noticed redness spreading around the wound.  She drew a circle and marker around the redness and the redness is now approximately 2 inches past the initial borders from earlier today.  Concerns for possible infection/cellulitis ? ?Review of Systems  ?Positive: Skin erythema, warmth ?Negative: Fever ? ?Physical Exam  ?BP (!) 133/55 (BP Location: Right Arm)   Pulse 78   Temp 98.5 ?F (36.9 ?C) (Oral)   Resp 18   Ht '5\' 8"'$  (1.727 m)   Wt 56 kg   SpO2 100%   BMI 18.77 kg/m?  ?Gen:   Awake, no distress   ?Resp:  Normal effort  ?MSK:   Moves extremities without difficulty  ?Other:   ? ?Medical Decision Making  ?Medically screening exam initiated at 9:59 PM.  Appropriate orders placed.  KYRIAKI MODER was informed that the remainder of the evaluation will be completed by another provider, this initial triage assessment does not replace that evaluation, and the importance of remaining in the ED until their evaluation is complete. ? ? ?  ?Dorothyann Peng, PA-C ?12/23/21 2202 ? ?

## 2021-12-23 NOTE — ED Triage Notes (Signed)
Pt states that she was bitten by a dog on Tuesday. Was seen at [facility] and sent to [facility]. Pt states that she thinks the wound is infected now ?

## 2021-12-24 DIAGNOSIS — L03113 Cellulitis of right upper limb: Secondary | ICD-10-CM | POA: Diagnosis present

## 2021-12-24 DIAGNOSIS — Z79810 Long term (current) use of selective estrogen receptor modulators (SERMs): Secondary | ICD-10-CM | POA: Diagnosis not present

## 2021-12-24 DIAGNOSIS — C773 Secondary and unspecified malignant neoplasm of axilla and upper limb lymph nodes: Secondary | ICD-10-CM | POA: Diagnosis present

## 2021-12-24 DIAGNOSIS — G4700 Insomnia, unspecified: Secondary | ICD-10-CM | POA: Diagnosis present

## 2021-12-24 DIAGNOSIS — Z923 Personal history of irradiation: Secondary | ICD-10-CM | POA: Diagnosis not present

## 2021-12-24 DIAGNOSIS — S41151A Open bite of right upper arm, initial encounter: Secondary | ICD-10-CM | POA: Diagnosis present

## 2021-12-24 DIAGNOSIS — L0390 Cellulitis, unspecified: Secondary | ICD-10-CM | POA: Diagnosis present

## 2021-12-24 DIAGNOSIS — Z1501 Genetic susceptibility to malignant neoplasm of breast: Secondary | ICD-10-CM | POA: Diagnosis not present

## 2021-12-24 DIAGNOSIS — M8580 Other specified disorders of bone density and structure, unspecified site: Secondary | ICD-10-CM | POA: Diagnosis present

## 2021-12-24 DIAGNOSIS — Z9013 Acquired absence of bilateral breasts and nipples: Secondary | ICD-10-CM | POA: Diagnosis not present

## 2021-12-24 DIAGNOSIS — Z885 Allergy status to narcotic agent status: Secondary | ICD-10-CM | POA: Diagnosis not present

## 2021-12-24 DIAGNOSIS — E872 Acidosis, unspecified: Secondary | ICD-10-CM | POA: Diagnosis present

## 2021-12-24 DIAGNOSIS — D849 Immunodeficiency, unspecified: Secondary | ICD-10-CM | POA: Diagnosis present

## 2021-12-24 DIAGNOSIS — Z9221 Personal history of antineoplastic chemotherapy: Secondary | ICD-10-CM | POA: Diagnosis not present

## 2021-12-24 DIAGNOSIS — Z880 Allergy status to penicillin: Secondary | ICD-10-CM | POA: Diagnosis not present

## 2021-12-24 DIAGNOSIS — Z881 Allergy status to other antibiotic agents status: Secondary | ICD-10-CM | POA: Diagnosis not present

## 2021-12-24 DIAGNOSIS — W540XXA Bitten by dog, initial encounter: Secondary | ICD-10-CM | POA: Diagnosis not present

## 2021-12-24 DIAGNOSIS — Z91041 Radiographic dye allergy status: Secondary | ICD-10-CM | POA: Diagnosis not present

## 2021-12-24 DIAGNOSIS — Z853 Personal history of malignant neoplasm of breast: Secondary | ICD-10-CM | POA: Diagnosis not present

## 2021-12-24 LAB — COMPREHENSIVE METABOLIC PANEL
ALT: 9 U/L (ref 0–44)
AST: 13 U/L — ABNORMAL LOW (ref 15–41)
Albumin: 3.2 g/dL — ABNORMAL LOW (ref 3.5–5.0)
Alkaline Phosphatase: 42 U/L (ref 38–126)
Anion gap: 7 (ref 5–15)
BUN: 8 mg/dL (ref 6–20)
CO2: 21 mmol/L — ABNORMAL LOW (ref 22–32)
Calcium: 8.4 mg/dL — ABNORMAL LOW (ref 8.9–10.3)
Chloride: 112 mmol/L — ABNORMAL HIGH (ref 98–111)
Creatinine, Ser: 0.96 mg/dL (ref 0.44–1.00)
GFR, Estimated: >60 mL/min (ref >60)
Glucose, Bld: 112 mg/dL — ABNORMAL HIGH (ref 70–99)
Potassium: 3.9 mmol/L (ref 3.5–5.1)
Sodium: 140 mmol/L (ref 135–145)
Total Bilirubin: 0.7 mg/dL (ref 0.3–1.2)
Total Protein: 5.6 g/dL — ABNORMAL LOW (ref 6.5–8.1)

## 2021-12-24 LAB — CBC
HCT: 37 % (ref 36.0–46.0)
Hemoglobin: 12.8 g/dL (ref 12.0–15.0)
MCH: 32.2 pg (ref 26.0–34.0)
MCHC: 34.6 g/dL (ref 30.0–36.0)
MCV: 93 fL (ref 80.0–100.0)
Platelets: 150 10*3/uL (ref 150–400)
RBC: 3.98 MIL/uL (ref 3.87–5.11)
RDW: 12.6 % (ref 11.5–15.5)
WBC: 6.6 10*3/uL (ref 4.0–10.5)
nRBC: 0 % (ref 0.0–0.2)

## 2021-12-24 LAB — LACTIC ACID, PLASMA: Lactic Acid, Venous: 0.7 mmol/L (ref 0.5–1.9)

## 2021-12-24 LAB — MAGNESIUM: Magnesium: 2 mg/dL (ref 1.7–2.4)

## 2021-12-24 LAB — HIV ANTIBODY (ROUTINE TESTING W REFLEX): HIV Screen 4th Generation wRfx: NONREACTIVE

## 2021-12-24 LAB — PHOSPHORUS: Phosphorus: 3.1 mg/dL (ref 2.5–4.6)

## 2021-12-24 MED ORDER — DIPHENHYDRAMINE HCL 50 MG/ML IJ SOLN: 25.0000 mg | Freq: Four times a day (QID) | INTRAMUSCULAR | Status: DC | PRN

## 2021-12-24 MED ORDER — SODIUM CHLORIDE 0.9 % IV SOLN
2.0000 g | INTRAVENOUS | Status: DC
  Administered 2021-12-25: 2 g via INTRAVENOUS
  Filled 2021-12-24: qty 20

## 2021-12-24 MED ORDER — FENTANYL CITRATE PF 50 MCG/ML IJ SOSY
50.0000 ug | PREFILLED_SYRINGE | Freq: Once | INTRAMUSCULAR | Status: AC
  Administered 2021-12-24: 50 ug via INTRAVENOUS
  Filled 2021-12-24: qty 1

## 2021-12-24 MED ORDER — METRONIDAZOLE 500 MG/100ML IV SOLN
500.0000 mg | Freq: Two times a day (BID) | INTRAVENOUS | Status: DC
  Administered 2021-12-24: 500 mg via INTRAVENOUS
  Filled 2021-12-24: qty 100

## 2021-12-24 MED ORDER — SODIUM CHLORIDE 0.9 % IV SOLN: 2.0000 g | INTRAVENOUS | Status: DC

## 2021-12-24 MED ORDER — OXYCODONE HCL 5 MG PO TABS
5.0000 mg | ORAL_TABLET | Freq: Four times a day (QID) | ORAL | Status: DC | PRN
  Administered 2021-12-24 – 2021-12-25 (×4): 5 mg via ORAL
  Filled 2021-12-24 (×4): qty 1

## 2021-12-24 MED ORDER — CEFTRIAXONE SODIUM 1 G IJ SOLR
1.0000 g | Freq: Once | INTRAMUSCULAR | Status: AC
Start: 2021-12-24 — End: 2021-12-24
  Administered 2021-12-24: 1 g via INTRAVENOUS
  Filled 2021-12-24: qty 10

## 2021-12-24 MED ORDER — ONDANSETRON HCL 4 MG/2ML IJ SOLN
4.0000 mg | Freq: Four times a day (QID) | INTRAMUSCULAR | Status: DC | PRN
  Administered 2021-12-24: 4 mg via INTRAVENOUS
  Filled 2021-12-24: qty 2

## 2021-12-24 MED ORDER — SENNOSIDES-DOCUSATE SODIUM 8.6-50 MG PO TABS
1.0000 | ORAL_TABLET | Freq: Every day | ORAL | Status: DC
  Administered 2021-12-24 – 2021-12-25 (×2): 1 via ORAL
  Filled 2021-12-24 (×2): qty 1

## 2021-12-24 MED ORDER — CALCIUM CARBONATE 1250 (500 CA) MG PO TABS
1250.0000 mg | ORAL_TABLET | Freq: Every day | ORAL | Status: DC
Start: 2021-12-24 — End: 2021-12-25
  Administered 2021-12-25: 1250 mg via ORAL
  Filled 2021-12-24 (×2): qty 1

## 2021-12-24 MED ORDER — TAMOXIFEN CITRATE 10 MG PO TABS
20.0000 mg | ORAL_TABLET | Freq: Every day | ORAL | Status: DC
  Filled 2021-12-24: qty 2

## 2021-12-24 MED ORDER — TRAZODONE HCL 50 MG PO TABS
50.0000 mg | ORAL_TABLET | Freq: Every evening | ORAL | Status: DC | PRN
  Administered 2021-12-24: 50 mg via ORAL
  Filled 2021-12-24: qty 1

## 2021-12-24 MED ORDER — ADULT MULTIVITAMIN W/MINERALS CH
1.0000 | ORAL_TABLET | Freq: Every day | ORAL | Status: DC
  Administered 2021-12-24 – 2021-12-25 (×2): 1 via ORAL
  Filled 2021-12-24 (×2): qty 1

## 2021-12-24 MED ORDER — DIPHENHYDRAMINE HCL 50 MG/ML IJ SOLN: 25.0000 mg | Freq: Once | INTRAMUSCULAR | Status: DC | PRN

## 2021-12-24 MED ORDER — TAMOXIFEN CITRATE 10 MG PO TABS: 20.0000 mg | ORAL_TABLET | Freq: Every day | ORAL | Status: DC

## 2021-12-24 MED ORDER — TAMOXIFEN CITRATE 10 MG PO TABS
20.0000 mg | ORAL_TABLET | Freq: Every day | ORAL | Status: DC
Start: 2021-12-24 — End: 2021-12-25
  Administered 2021-12-24: 20 mg via ORAL
  Filled 2021-12-24 (×2): qty 2

## 2021-12-24 MED ORDER — LACTATED RINGERS IV SOLN: INTRAVENOUS | Status: DC

## 2021-12-24 MED ORDER — MAGNESIUM OXIDE -MG SUPPLEMENT 400 (240 MG) MG PO TABS
400.0000 mg | ORAL_TABLET | Freq: Every day | ORAL | Status: DC
  Administered 2021-12-24 – 2021-12-25 (×2): 400 mg via ORAL
  Filled 2021-12-24 (×2): qty 1

## 2021-12-24 MED ORDER — HYDROMORPHONE HCL 1 MG/ML IJ SOLN
0.5000 mg | INTRAMUSCULAR | Status: DC | PRN
  Administered 2021-12-24: 0.5 mg via INTRAVENOUS
  Filled 2021-12-24: qty 1

## 2021-12-24 MED ORDER — POLYETHYLENE GLYCOL 3350 17 G PO PACK: 17.0000 g | PACK | Freq: Every day | ORAL | Status: DC | PRN

## 2021-12-24 MED ORDER — ASCORBIC ACID 500 MG PO TABS
500.0000 mg | ORAL_TABLET | Freq: Every day | ORAL | Status: DC
  Administered 2021-12-24 – 2021-12-25 (×2): 500 mg via ORAL
  Filled 2021-12-24 (×2): qty 1

## 2021-12-24 MED ORDER — METRONIDAZOLE 500 MG/100ML IV SOLN
500.0000 mg | Freq: Two times a day (BID) | INTRAVENOUS | Status: DC
  Administered 2021-12-24 – 2021-12-25 (×2): 500 mg via INTRAVENOUS
  Filled 2021-12-24 (×2): qty 100

## 2021-12-24 MED ORDER — GABAPENTIN 300 MG PO CAPS
300.0000 mg | ORAL_CAPSULE | Freq: Every day | ORAL | Status: DC
  Administered 2021-12-24: 300 mg via ORAL
  Filled 2021-12-24: qty 1

## 2021-12-24 MED ORDER — VITAMIN D 25 MCG (1000 UNIT) PO TABS
1000.0000 [IU] | ORAL_TABLET | Freq: Every day | ORAL | Status: DC
Start: 2021-12-24 — End: 2021-12-25
  Administered 2021-12-24 – 2021-12-25 (×2): 1000 [IU] via ORAL
  Filled 2021-12-24 (×2): qty 1

## 2021-12-24 NOTE — Progress Notes (Addendum)
Brief same-day note: ? ?Patient is a 51 year old female with history of left breast cancer with mets to axillary lymph nodes, status post bilateral mastectomy, adjuvant chemo/radiation who presented to the emergency department for the evaluation of right upper extremity cellulitis after dog bite.  She was taking oral antibiotics without improvement in her symptoms.  She complained of increased pain, erythema, fever.  Patient was admitted for further management of right upper extremity cellulitis.  On presentation, her blood pressure was soft but she was hemodynamically stable.  No leukocytosis.  Patient was started on broad-spectrum IV antibiotics. ?Patient seen and examined at the bedside this morning.  She remains comfortable still having some discomfort on the right upper extremity but the wound looks much better today.  She denies any other complaints.  We will change antibiotics to Unasyn.  Possible discharge to home tomorrow with oral antibiotics. ?

## 2021-12-24 NOTE — H&P (Addendum)
?History and Physical ? ?[name] HCW:[id number] DOB: April 15, 1971 DOA: 12/23/2021 ? ?Referring physician: Dr. Marlon Pel, [place]. ?PCP: Michael Boston, MD  ?Outpatient Specialists: Medical oncology. ?Patient coming from: Home. ? ?Chief Complaint: Dog bite, right arm redness, swelling, pain. ? ?HPI: Madison Savage is a 51 y.o. 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 Burr Medico, osteopenia, anaphylaxis reaction from clindamycin and a child allergy to penicillin.  The patient presented to [facility] ED after failing outpatient antibiotic therapy for right upper extremity cellulitis at the site of a dog bite that took place on 12/17/2021.   At that time she presented to [facility] ER, was seen by hand surgeon there, they did not feel like she had any tendons or nerve damage, therefore they closed the wound.  She had 1 rabies vaccine.  She received the first vaccine and the immunoglobin with plan to return for another vaccine on 12/28/21 and 01/04/22.  She was discharged from the ER on Bactrim and metronidazole.  Completed 2/5 days of p.o. antibiotics therapy without improvement of her symptomatology.  She endorses a fever at home 102 with worsening pain affecting her right elbow prior to presenting to the ED at [facility].  In the ED she is noted to have significant erythema, edema and tenderness with minimal palpation.  Due to concern for cellulitis in immunocompromised state, failing outpatient therapy, EDP requested admission for further evaluation and management of her symptomatology.  Patient was admitted by the hospitalist service, [facility]. ? ?ED Course: Tmax 98.5.  BP 103/63, pulse 66, respiration rate 14, saturation 96% on room air.  Lab studies significant for serum bicarb 21, creatinine 1.03 with GFR greater than 60.  Lactic acid 0.7.   CBC is essentially unremarkable. ? ?Review of Systems: ?Review of systems as noted in the HPI. All other systems reviewed and are negative. ? ? ?Past Medical History:  ?Diagnosis Date  ? Anemia   ? Anxiety   ? BRCA2 positive   ? Chronic ITP (idiopathic thrombocytopenia) (HCC)   ? Family history of breast cancer   ? Family history of ovarian cancer   ? Headache   ? last migraine...9/24  ? History of cancer chemotherapy   ? left breast 06-09-2017  to 11-16-2017  ? History of cardiac murmur as a child   ? infant  ? History of external beam radiation therapy   ? left breast   01-03-2018  to 02-19-2018  ? MTHFR mutation   ? PONV (postoperative nausea and vomiting)   ? 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 [facility]) oncologist-- dr Burr Medico  ? dx 03-22-2017--- 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/  04-27-2018  s/p  bilateral mastectomy with left sln dissection's with reconstruction,  completed chemothearpy 11-16-2017,  completed radiation 02-19-2018,  started tamoxifen 07/ 2019  ? Wears contact lenses   ? ?Past Surgical History:  ?Procedure Laterality Date  ? AXILLARY LYMPH NODE DISSECTION Left 04/27/2017  ? Procedure: AXILLARY LYMPH NODE DISSECTION;  Surgeon: [name], MD;  Location: [place];  Service: General;  Laterality: Left;  ? BILATERAL TOTAL MASTECTOMY WITH AXILLARY LYMPH NODE DISSECTION Bilateral 04/27/2017  ? Procedure: SKIN SPARING MASTECTOMY;  Surgeon: [name], MD;  Location: [place];  Service: General;  Laterality: Bilateral;  ? BREAST  CAPSULECTOMY WITH IMPLANT EXCHANGE Bilateral 12/06/2017  ? Procedure: BREAST CAPSULECTOMY WITH IMPLANT EXCHANGE;  Surgeon: Wallace Going, DO;  Location: [place];  Service: Plastics;  Laterality: Bilateral;  ? BREAST RECONSTRUCTION WITH PLACEMENT OF TISSUE EXPANDER AND FLEX HD (ACELLULAR HYDRATED DERMIS) Bilateral 04/27/2017  ? Procedure: BILATERAL BREAST  RECONSTRUCTION WITH PLACEMENT OF TISSUE EXPANDER AND FLEX HD (ACELLULAR HYDRATED DERMIS);  Surgeon: Wallace Going, DO;  Location: [place];  Service: Plastics;  Laterality: Bilateral;  ? MASTECTOMY Bilateral   ? PLACEMENT OF BREAST IMPLANTS Bilateral 12/06/2017  ? Procedure: PLACEMENT OF BREAST IMPLANTS;  Surgeon: Wallace Going, DO;  Location: [place];  Service: Plastics;  Laterality: Bilateral;  ? PORT-A-CATH REMOVAL Right 12/06/2017  ? Procedure: REMOVAL PORT-A-CATH;  Surgeon: Wallace Going, DO;  Location: [place];  Service: Plastics;  Laterality: Right;  ? PORTACATH PLACEMENT Right 06/01/2017  ? Procedure: INSERTION PORT-A-CATH RIGHT SUBCLAVIAN;  Surgeon: [name], MD;  Location: [place];  Service: General;  Laterality: Right;  ? ROBOTIC ASSISTED SALPINGO OOPHERECTOMY Bilateral 08/31/2018  ? Procedure: XI ROBOTIC ASSISTED BILATERAL  SALPINGO OOPHORECTOMY;  Surgeon: Everitt Amber, MD;  Location: [facility];  Service: Gynecology;  Laterality: Bilateral;  ? WISDOM TOOTH EXTRACTION    ? ? ?Social History:  reports that she quit smoking about 4 years ago. Her smoking use included cigarettes. She has a 10.00 pack-year smoking history. She has never used smokeless tobacco. She reports that she does not drink alcohol and does not use drugs. ? ? ?Allergies  ?Allergen Reactions  ? Clindamycin Anaphylaxis, Shortness Of Breath and Rash  ? Contrast Media [Iodinated Contrast Media] Shortness Of Breath  ? Penicillins Nausea And Vomiting and Rash  ? Codeine Itching  ? ? ?Family History  ?Problem Relation Age of Onset  ? Breast cancer Paternal Aunt 12  ? Ovarian cancer Paternal Aunt 62  ? Cancer Paternal Grandmother   ?     breast cancer   ? Cancer Maternal Aunt   ?     lymphoma   ? Cancer Paternal Grandfather   ?     throat cancer   ? Cancer Paternal Aunt   ?     lung cancer  ? Cirrhosis Father   ? AAA (abdominal aortic aneurysm) Maternal  Grandfather   ? Leukemia Paternal Aunt   ?  ? ? ?Prior to Admission medications   ?Medication Sig Start Date End Date Taking? Authorizing Provider  ?ALPRAZolam (XANAX) 0.25 MG tablet Take 1 tablet (0.25 mg total) by mouth at bedtime as needed for anxiety. ?Patient not taking: No sig reported 10/25/18   [name], MD  ?ascorbic acid (VITAMIN C) 500 MG tablet Take by mouth.    [provider]  ?b complex vitamins capsule Take 1 capsule by mouth daily.    [provider]  ?Cholecalciferol (VITAMIN D-3 PO) Take 1 capsule by mouth daily.    [provider]  ?gabapentin (NEURONTIN) 100 MG capsule Take 3 capsules (300 mg total) by mouth at bedtime. 11/08/21   [name], MD  ?ibuprofen (ADVIL,MOTRIN) 800 MG tablet Take 1 tablet (800 mg total) by mouth every 8 (eight) hours as needed. ?Patient not taking: No sig reported 08/31/18   Everitt Amber, MD  ?MAGNESIUM PO Take 1 tablet by mouth daily. Take Muscle cramps    [provider]  ?Misc Natural Products (CALCIUM PLUS ADVANCED PO) Take 1 tablet by mouth daily.  [provider]  ?Multiple Vitamins-Calcium (ONE-A-DAY WOMENS PO) Take 1 tablet by mouth daily.    [provider]  ?OVER THE COUNTER MEDICATION Take 1 capsule by mouth daily. Biotin/MSM    [provider]  ?tamoxifen (NOLVADEX) 20 MG tablet Take 1 tablet (20 mg total) by mouth daily. 11/08/21   [name], MD  ? ? ?Physical Exam: ?BP 119/69   Pulse 70   Temp 98.5 ?F (36.9 ?C) (Oral)   Resp 11   Ht _0  (1.727 m)   Wt 56 kg   SpO2 99%   BMI 18.77 kg/m?  ? ?General: 51 y.o. year-old female well developed well nourished in no acute distress.  Alert and oriented x3. ?Cardiovascular: Regular rate and rhythm with no rubs or gallops.  No thyromegaly or JVD noted.  No lower extremity edema. 2/4 pulses in all 4 extremities. ?Respiratory: Clear to auscultation with no wheezes or rales. Good inspiratory effort. ?Abdomen: Soft nontender nondistended with normal  bowel sounds x4 quadrants. ?Muskuloskeletal: No cyanosis, clubbing or edema noted bilaterally ?Neuro: CN II-XII intact, strength, sensation, reflexes ?Skin: Right upper extremity erythema, edema, warmth, tenderness with minim

## 2021-12-24 NOTE — ED Provider Notes (Signed)
?[place] DEPT ?[facility] Emergency Department ?Provider Note ?MRN:  [id number]  ?Arrival date & time: 12/24/21    ? ?Chief Complaint   ?Wound Infection ?  ?History of Present Illness   ?Madison Savage is a 51 y.o. year-old female presents to the ED with chief complaint of wound infection.  Patient was bitten by a dog on 4/18.  She was started on Bactrim and Flagyl at [facility].  She reports penicillin and clindamycin allergies.  She states that she has been compliant on the medications, but today the pain, swelling, and redness on her arm have worsened.  She reports fever at home to 102 degrees.   ? ?History provided by patient. ? ? ?Review of Systems  ?Pertinent review of systems noted in HPI.  ? ? ?Physical Exam  ? ?Vitals:  ? 12/24/21 0023 12/24/21 0105  ?BP: 91/60 119/69  ?Pulse: 71 70  ?Resp: 17 11  ?Temp:    ?SpO2: 98% 99%  ?  ?CONSTITUTIONAL:  non toxic-appearing, NAD ?NEURO:  Alert and oriented x 3, CN 3-12 grossly intact ?EYES:  eyes equal and reactive ?ENT/NECK:  Supple, no stridor  ?CARDIO:  normal rate, regular rhythm, appears well-perfused  ?PULM:  No respiratory distress,  ?GI/GU:  non-distended,  ?MSK/SPINE: Good range of motion of the right elbow, there is some limitations at full flexion and full extension, overall 4/5 range of motion ?SKIN: There is erythema consistent with cellulitis to the posterior right forearm and elbow, there are healing puncture wounds to the right anterior forearm, no obvious abscess ? ? ?*Additional and/or pertinent findings included in MDM below ? ?Diagnostic and Interventional Summary  ? ? EKG Interpretation ? ?Date/Time:    ?Ventricular Rate:    ?PR Interval:    ?QRS Duration:   ?QT Interval:    ?QTC Calculation:   ?R Axis:     ?Text Interpretation:   ?  ? ?  ? ?Labs Reviewed  ?BASIC METABOLIC PANEL - Abnormal; Notable for the following components:  ?    Result Value  ? CO2 21 (*)   ? Glucose, Bld 108 (*)   ? Creatinine, Ser 1.03 (*)   ? Calcium 8.6 (*)   ?  All other components within normal limits  ?CBC  ?LACTIC ACID, PLASMA  ?LACTIC ACID, PLASMA  ?  ?No orders to display  ?  ?Medications  ?cefTRIAXone (ROCEPHIN) 1 g in sodium chloride 0.9 % 100 mL IVPB (1 g Intravenous New Bag/Given 12/24/21 0209)  ?metroNIDAZOLE (FLAGYL) IVPB 500 mg (500 mg Intravenous New Bag/Given 12/24/21 0208)  ?diphenhydrAMINE (BENADRYL) injection 25 mg (has no administration in time range)  ?fentaNYL (SUBLIMAZE) injection 50 mcg (50 mcg Intravenous Given 12/24/21 0152)  ?  ? ?Procedures  /  Critical Care ?Procedures ? ?ED Course and Medical Decision Making  ?I have reviewed the triage vital signs, the nursing notes, and pertinent available records from the EMR. ? ?Complexity of Problems Addressed: ?High Complexity: Acute illness/injury posing a threat to life or bodily function, requiring emergent diagnostic workup, evaluation, and treatment as below. ?Comorbidities affecting this illness/injury include: ?None ?Social Determinants Affecting Care: ? No clinically significant social determinants affecting this chief complaint.. ? ? ?ED Course: ?After considering the following differential, cellulitis, septic arthritis, I ordered IV abx and labs. ?I personally interpreted the labs which are notable for no significant leukocytosis or electrolyte derangement . ? ?  ? ?Consultants: ?I discussed the case with Hospitalist, Dr. Nevada Crane, who is appreciated for admitting. ?I also  consulted with pharmacy regarding antibiotic therapy.  Recommendation is for Rocephin trial.  Treat itching or nausea with Benadryl.  Continue IV Flagyl. ?Treatment and Plan: ?Admit  ? ?Patient's exam and diagnostic results are concerning for cellulitis secondary to dog bite not responding to outpatient treatment.  Feel that patient will need admission to the hospital for further treatment and evaluation. ? ? ? ?Final Clinical Impressions(s) / ED Diagnoses  ? ?  ICD-10-CM   ?1. Wound infection  T14.8XXA   ? L08.9   ?  ?  ?ED Discharge  Orders   ? ? None  ? ?  ?  ? ? ?Discharge Instructions Discussed with and Provided to Patient:  ? ?Discharge Instructions   ?None ?  ? ?  ?Montine Circle, PA-C ?12/24/21 0215 ? ?  ?Orpah Greek, MD ?12/25/21 0750 ? ?

## 2021-12-25 ENCOUNTER — Inpatient Hospital Stay (HOSPITAL_COMMUNITY): Payer: BC Managed Care – PPO

## 2021-12-25 IMAGING — US US EXTREM UP *R* LTD
1 series · 8 of 8 positions shown · non-contrast
Comparison: None.

CLINICAL DATA: Cellulitis.  Bit by a dog [DATE].  Left forearm.

EXAM:
ULTRASOUND RIGHT UPPER EXTREMITY LIMITED
TECHNIQUE: Ultrasound examination of the upper extremity soft tissues was
performed in the area of clinical concern.

[Series 1: us soft tissue right upper extremity limited (non- · 8 of 8 slices shown]
[im 1/8]
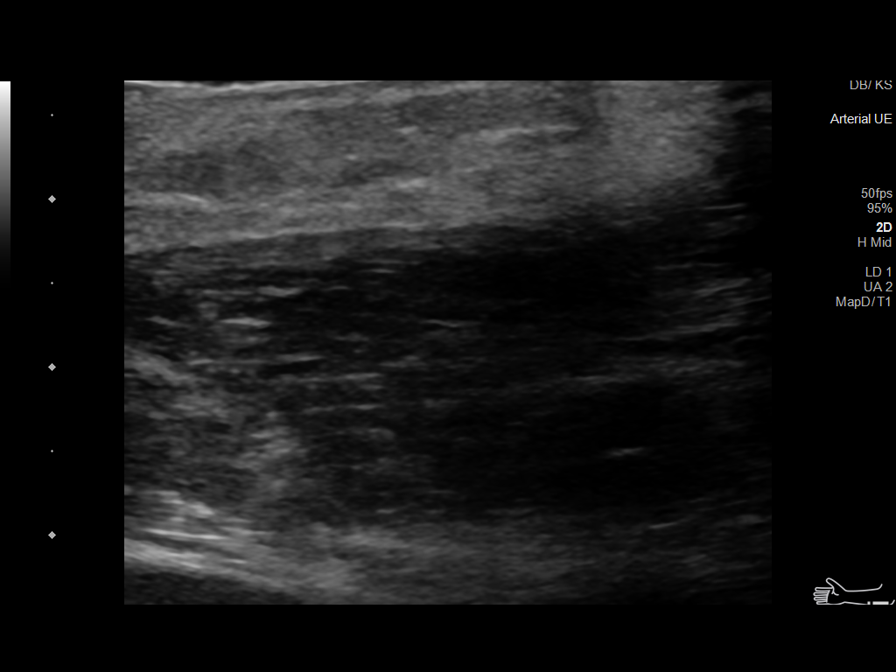
[im 2/8]
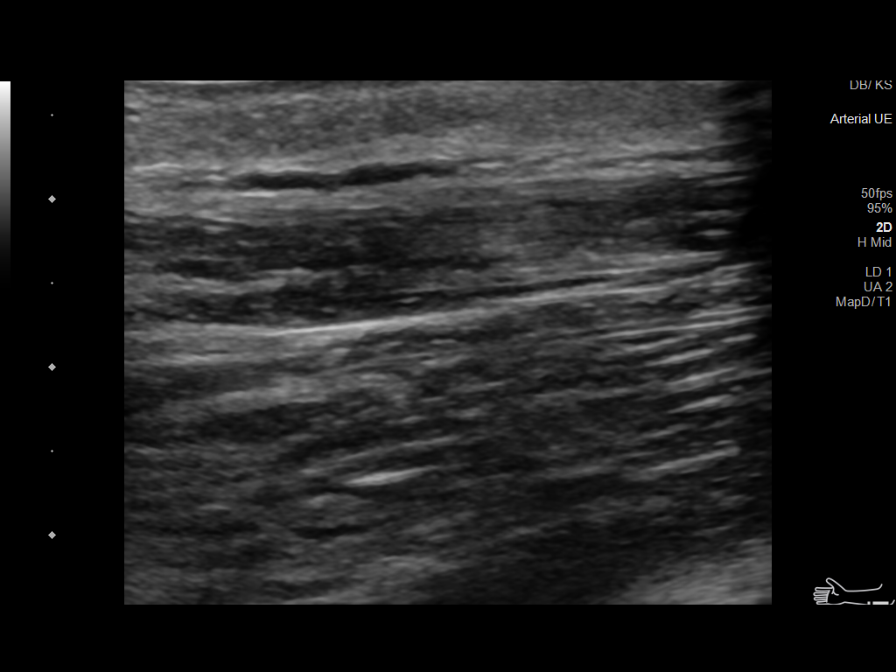
[im 3/8]
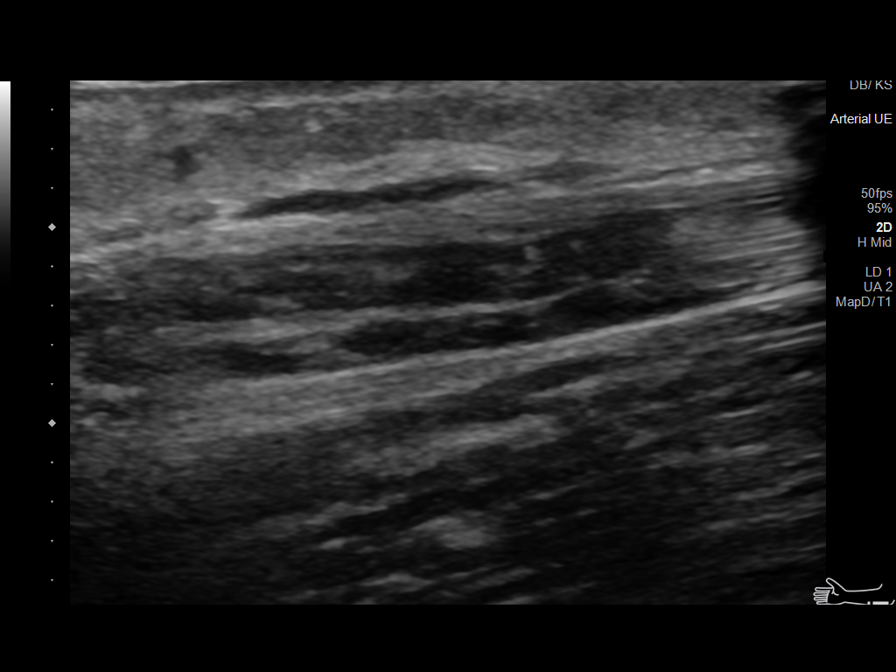
[im 4/8]
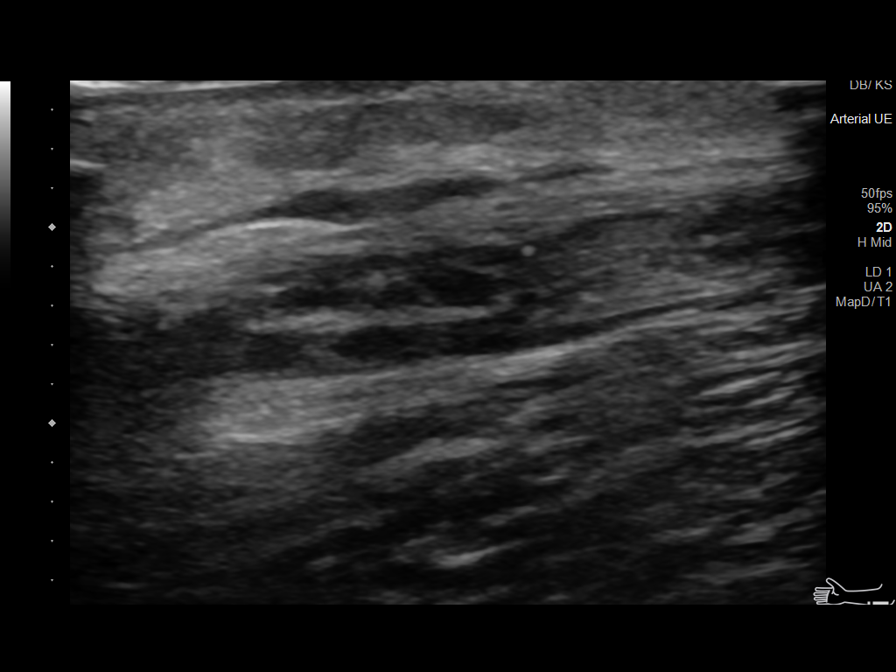
[im 5/8]
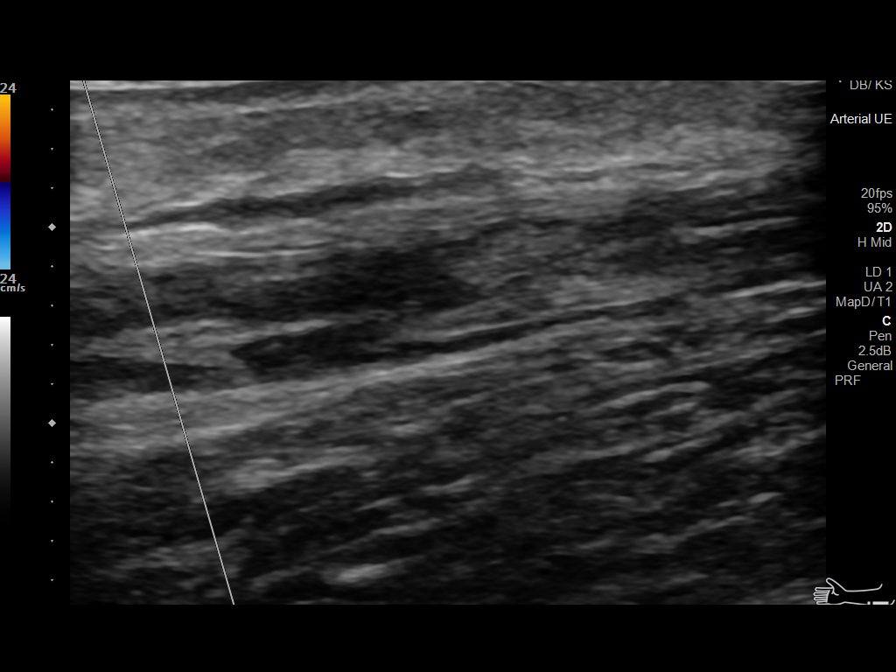
[im 6/8]
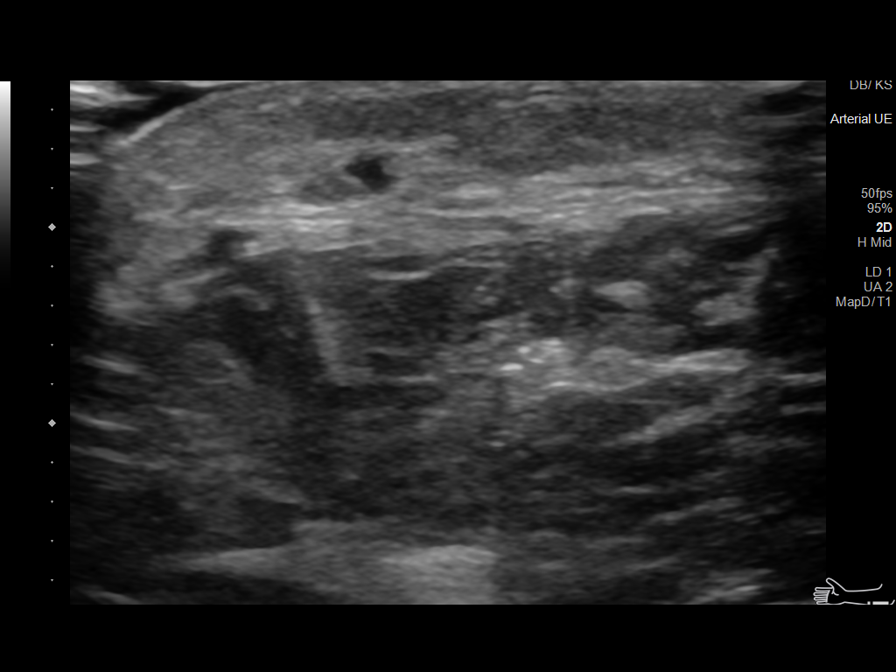
[im 7/8]
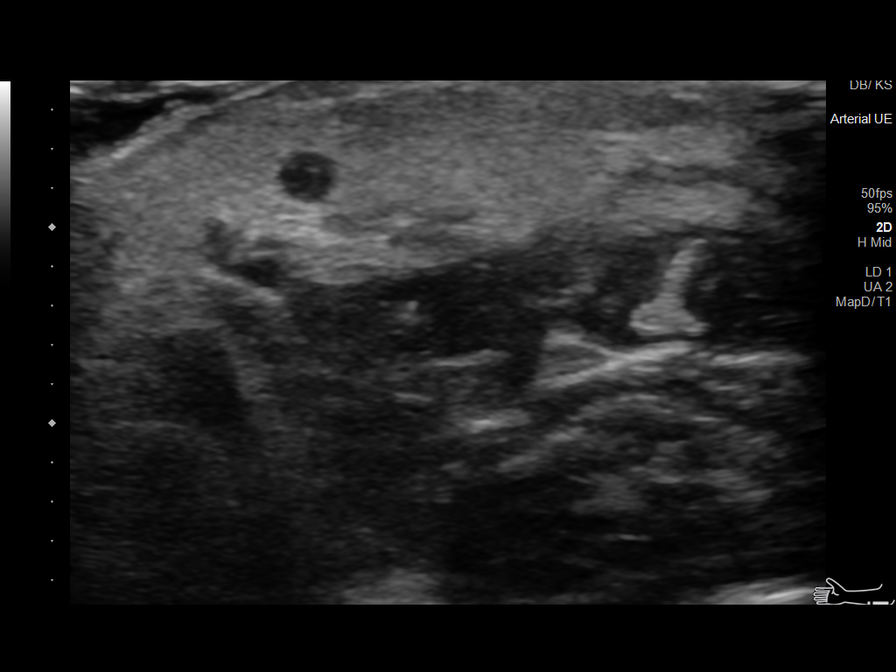
[im 8/8]
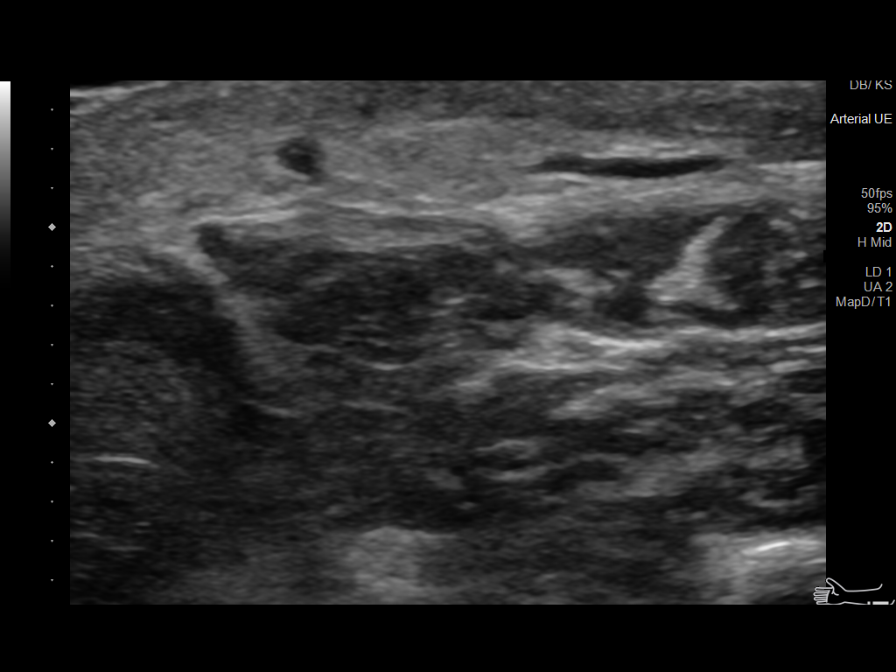

[8 of 8 positions shown; findings below may reference images not displayed]

FINDINGS: The area of interest imaged was the medial right forearm. There
stitches in the area of interest with redness and pain. On
ultrasound, no abnormal fluid collection or mass is seen. No
increased vascularity.
IMPRESSION: No fluid collection or mass is seen in the area of interest of the
medial right forearm.

## 2021-12-25 MED ORDER — CEPHALEXIN 500 MG PO CAPS
500.0000 mg | ORAL_CAPSULE | Freq: Four times a day (QID) | ORAL | 0 refills | Status: AC
Start: 2021-12-25 — End: 2021-12-30

## 2021-12-25 MED ORDER — METRONIDAZOLE 500 MG PO TABS
500.0000 mg | ORAL_TABLET | Freq: Two times a day (BID) | ORAL | 0 refills | Status: AC
Start: 2021-12-25 — End: 2021-12-30

## 2021-12-25 MED ORDER — OXYCODONE HCL 5 MG PO TABS: 5.0000 mg | ORAL_TABLET | Freq: Four times a day (QID) | ORAL | 0 refills | Status: DC | PRN

## 2021-12-25 NOTE — Discharge Summary (Addendum)
Physician Discharge Summary  ?ADELEI SCOBEY Savage:[id number] DOB: 03-05-1971 DOA: 12/23/2021 ? ?PCP: Michael Boston, MD ? ?Admit date: 12/23/2021 ?Discharge date: 12/25/2021 ? ?Admitted From: Home ?Disposition:  Home ? ?Discharge Condition:Stable ?CODE STATUS:FULL ?Diet recommendation: Regular  ? ?Brief/Interim Summary: ?Patient is a 51 year old female with history of left breast cancer with mets to axillary lymph nodes, status post bilateral mastectomy, adjuvant chemo/radiation who presented to the emergency department for the evaluation of right upper extremity cellulitis after dog bite.  She was taking oral antibiotics without improvement in her symptoms.  She complained of increased pain, erythema, fever.  Patient was admitted for further management of right upper extremity cellulitis.  On presentation, her blood pressure was soft but she was hemodynamically stable.  No leukocytosis.  Patient was started on broad-spectrum IV antibiotics.  Wound this morning looks better though still complains of pain.  There was no area of fluctuance.  We also checked ultrasound of the forearm and ruled out abscess or any underlying lesion.  She is medically stable for discharge home with oral antibiotics.  Will recommend to follow-up with her primary care physician for suture removal and wound check in a week ? ?Following problems were addressed during her hospitalization: ? ? ?Right upper extremity cellulitis: After dog bite, she was taking oral antibiotics at home which did not help.  Admitted for IV antibiotics.  Wound looks better today.  Management as above. ? ?Left breast cancer with mets to axillary lymph nodes: invasive ductal carcinoma, BRCA2 positive, diagnosed in 03/2017 ?Treated with bilateral mastectomy, adjuvant chemo and radiation, followed by Dr. Burr Medico from [place]. ?Resume home tamoxifen ? ?Discharge Diagnoses:  ?Principal Problem: ?  Cellulitis ? ? ? ?Discharge Instructions ? ?Discharge Instructions   ? ? Diet -  low sodium heart healthy   Complete by: As directed ?  ? Discharge instructions   Complete by: As directed ?  ? 1)Please take prescribed medications as instructed ?2)Follow up with your PCP in a week for suture removal and wound check.  ? Increase activity slowly   Complete by: As directed ?  ? No wound care   Complete by: As directed ?  ? ?  ? ?Allergies as of 12/25/2021   ? ?   Reactions  ? Clindamycin Anaphylaxis, Shortness Of Breath, Rash  ? Contrast Media [iodinated Contrast Media] Shortness Of Breath  ? Penicillins Nausea And Vomiting, Rash  ? Codeine Itching  ? ?  ? ?  ?Medication List  ?  ? ?STOP taking these medications   ? ?ALPRAZolam 0.25 MG tablet ?Commonly known as: XANAX ?  ?ibuprofen 800 MG tablet ?Commonly known as: ADVIL ?  ?ONE-A-DAY WOMENS PO ?  ? ?  ? ?TAKE these medications   ? ?ascorbic acid 500 MG tablet ?Commonly known as: VITAMIN C ?Take by mouth. ?  ?b complex vitamins capsule ?Take 1 capsule by mouth daily. ?  ?CALCIUM PLUS ADVANCED PO ?Take 1 tablet by mouth daily. ?  ?cephALEXin 500 MG capsule ?Commonly known as: KEFLEX ?Take 1 capsule (500 mg total) by mouth 4 (four) times daily for 5 days. ?  ?gabapentin 100 MG capsule ?Commonly known as: Neurontin ?Take 3 capsules (300 mg total) by mouth at bedtime. ?  ?MAGNESIUM PO ?Take 1 tablet by mouth daily. Take Muscle cramps ?  ?metroNIDAZOLE 500 MG tablet ?Commonly known as: FLAGYL ?Take 1 tablet (500 mg total) by mouth 2 (two) times daily for 5 days. ?  ?OVER THE COUNTER MEDICATION ?Take 1  capsule by mouth daily. Biotin/MSM ?  ?oxyCODONE 5 MG immediate release tablet ?Commonly known as: Oxy IR/ROXICODONE ?Take 1 tablet (5 mg total) by mouth every 6 (six) hours as needed for moderate pain or breakthrough pain. ?  ?tamoxifen 20 MG tablet ?Commonly known as: NOLVADEX ?Take 1 tablet (20 mg total) by mouth daily. ?  ?VITAMIN D-3 PO ?Take 1 capsule by mouth daily. ?  ? ?  ? ? Follow-up Information   ? ? Melida Quitter, MD. Schedule an appointment as  soon as possible for a visit in 1 week(s).   ?Specialty: Internal Medicine ?Why: for removal of suture and wound check ?Contact information: ?[address] ?[place] [address] ?[phone] ? ? ?  ?  ? ?  ?  ? ?  ? ?Allergies  ?Allergen Reactions  ? Clindamycin Anaphylaxis, Shortness Of Breath and Rash  ? Contrast Media [Iodinated Contrast Media] Shortness Of Breath  ? Penicillins Nausea And Vomiting and Rash  ? Codeine Itching  ? ? ?Consultations: ?None ? ? ?Procedures/Studies: ?[place] RT UPPER EXTREM LTD SOFT TISSUE NON VASCULAR ? ?Result Date: 12/25/2021 ?CLINICAL DATA:  Cellulitis.  Bit by a dog 12/21/2021.  Left forearm. EXAM: ULTRASOUND RIGHT UPPER EXTREMITY LIMITED TECHNIQUE: Ultrasound examination of the upper extremity soft tissues was performed in the area of clinical concern. COMPARISON:  None. FINDINGS: The area of interest imaged was the medial right forearm. There stitches in the area of interest with redness and pain. On ultrasound, no abnormal fluid collection or mass is seen. No increased vascularity. IMPRESSION: No fluid collection or mass is seen in the area of interest of the medial right forearm. Electronically Signed   By: Neita Garnet M.D.   On: 12/25/2021 10:18   ? ? ? ?Subjective: ?Patient seen and examined at the bedside this morning.  Hemodynamically stable for discharge today. ? ?Discharge Exam: ?Vitals:  ? 12/25/21 0512 12/25/21 0828  ?BP: (!) 99/47 121/66  ?Pulse: (!) 59 62  ?Resp: 17 16  ?Temp: 98.1 ?F (36.7 ?C) 97.9 ?F (36.6 ?C)  ?SpO2: 95% 99%  ? ?Vitals:  ? 12/24/21 1414 12/24/21 2200 12/25/21 0512 12/25/21 0828  ?BP: 114/67 98/63 (!) 99/47 121/66  ?Pulse: 70 69 (!) 59 62  ?Resp:  18 17 16   ?Temp: 97.8 ?F (36.6 ?C) 98.4 ?F (36.9 ?C) 98.1 ?F (36.7 ?C) 97.9 ?F (36.6 ?C)  ?TempSrc: Oral  Oral Oral  ?SpO2: 97%  95% 99%  ?Weight:      ?Height:      ? ? ?General: Pt is alert, awake, not in acute distress ?Cardiovascular: RRR, S1/S2 +, no rubs, no gallops ?Respiratory: CTA bilaterally, no  wheezing, no rhonchi ?Abdominal: Soft, NT, ND, bowel sounds + ?Extremities: no edema, no cyanosis ? ? ? ?The results of significant diagnostics from this hospitalization (including imaging, microbiology, ancillary and laboratory) are listed below for reference.   ? ? ?Microbiology: ?Recent Results (from the past 240 hour(s))  ?Culture, blood (Routine X 2) w Reflex to ID Panel     Status: None (Preliminary result)  ? Collection Time: 12/24/21  2:23 AM  ? Specimen: BLOOD RIGHT HAND  ?Result Value Ref Range Status  ? Specimen Description BLOOD RIGHT HAND  Final  ? Special Requests   Final  ?  BOTTLES DRAWN AEROBIC AND ANAEROBIC Blood Culture results may not be optimal due to an inadequate volume of blood received in culture bottles  ? Culture   Final  ?  NO GROWTH 1 DAY ?  Performed at [facility], [place] [address] [place], [place] [address] ?  ? Report Status PENDING  Incomplete  ?Culture, blood (Routine X 2) w Reflex to ID Panel     Status: None (Preliminary result)  ? Collection Time: 12/24/21  2:28 AM  ? Specimen: BLOOD RIGHT FOREARM  ?Result Value Ref Range Status  ? Specimen Description BLOOD RIGHT FOREARM  Final  ? Special Requests   Final  ?  BOTTLES DRAWN AEROBIC AND ANAEROBIC Blood Culture adequate volume  ? Culture   Final  ?  NO GROWTH 1 DAY ?Performed at [facility], [place] [address] [place], [place] [address] ?  ? Report Status PENDING  Incomplete  ?  ? ?Labs: ?BNP (last 3 results) ?No results for input(s): BNP in the last 8760 hours. ?Basic Metabolic Panel: ?Recent Labs  ?Lab 12/23/21 ?2212 12/24/21 ?0305  ?NA 136 140  ?K 4.0 3.9  ?CL 110 112*  ?CO2 21* 21*  ?GLUCOSE 108* 112*  ?BUN 14 8  ?CREATININE 1.03* 0.96  ?CALCIUM 8.6* 8.4*  ?MG  --  2.0  ?PHOS  --  3.1  ? ?Liver Function Tests: ?Recent Labs  ?Lab 12/24/21 ?0305  ?AST 13*  ?ALT 9  ?ALKPHOS 42  ?BILITOT 0.7  ?PROT 5.6*  ?ALBUMIN 3.2*  ? ?No results for input(s): LIPASE, AMYLASE in the last 168 hours. ?No results for input(s): AMMONIA in  the last 168 hours. ?CBC: ?Recent Labs  ?Lab 12/23/21 ?2212 12/24/21 ?0305  ?WBC 8.4 6.6  ?HGB 13.3 12.8  ?HCT 39.1 37.0  ?MCV 93.1 93.0  ?PLT 170 150  ? ?Cardiac Enzymes: ?No results for input(s): CKTOTAL

## 2021-12-25 NOTE — Progress Notes (Incomplete)
DISCHARGE NOTE HOME ?[name] to be discharged Home per MD order. Discussed prescriptions and follow up appointments with the patient. Prescriptions given to patient; medication list explained in detail. Patient verbalized understanding. ? ?Skin clean, dry and intact without evidence of skin break down, no evidence of skin tears noted. IV catheter discontinued intact. Site without signs and symptoms of complications. Dressing and pressure applied. Pt denies pain at the site currently. No complaints noted. ? ?Patient free of lines, drains, and wounds.  ? ?An After Visit Summary (AVS) was printed and given to the patient. ?Patient escorted via wheelchair, and discharged home via private auto. ? ?Berneta Levins, RN  ?

## 2021-12-25 NOTE — Plan of Care (Signed)
  Problem: Health Behavior/Discharge Planning: Goal: Ability to manage health-related needs will improve Outcome: Progressing   

## 2021-12-25 NOTE — Progress Notes (Signed)
Right arm incision present on admission per patient ?

## 2021-12-28 ENCOUNTER — Encounter (HOSPITAL_COMMUNITY): Payer: Self-pay | Admitting: Emergency Medicine

## 2021-12-28 ENCOUNTER — Emergency Department (HOSPITAL_COMMUNITY): Payer: BC Managed Care – PPO

## 2021-12-28 ENCOUNTER — Emergency Department (HOSPITAL_COMMUNITY)
Admission: EM | Admit: 2021-12-28 | Discharge: 2021-12-28 | Disposition: A | Discharge status: Home / Self Care | Payer: BC Managed Care – PPO | Attending: Emergency Medicine | Admitting: Emergency Medicine

## 2021-12-28 ENCOUNTER — Other Ambulatory Visit: Payer: Self-pay

## 2021-12-28 DIAGNOSIS — S51811D Laceration without foreign body of right forearm, subsequent encounter: Secondary | ICD-10-CM | POA: Insufficient documentation

## 2021-12-28 DIAGNOSIS — L089 Local infection of the skin and subcutaneous tissue, unspecified: Secondary | ICD-10-CM | POA: Diagnosis not present

## 2021-12-28 DIAGNOSIS — Z23 Encounter for immunization: Secondary | ICD-10-CM | POA: Diagnosis not present

## 2021-12-28 DIAGNOSIS — W540XXA Bitten by dog, initial encounter: Secondary | ICD-10-CM | POA: Diagnosis not present

## 2021-12-28 DIAGNOSIS — S51851A Open bite of right forearm, initial encounter: Secondary | ICD-10-CM | POA: Diagnosis not present

## 2021-12-28 DIAGNOSIS — X58XXXD Exposure to other specified factors, subsequent encounter: Secondary | ICD-10-CM | POA: Insufficient documentation

## 2021-12-28 DIAGNOSIS — Z4802 Encounter for removal of sutures: Secondary | ICD-10-CM | POA: Insufficient documentation

## 2021-12-28 DIAGNOSIS — Z87891 Personal history of nicotine dependence: Secondary | ICD-10-CM | POA: Diagnosis not present

## 2021-12-28 DIAGNOSIS — M7989 Other specified soft tissue disorders: Secondary | ICD-10-CM | POA: Diagnosis not present

## 2021-12-28 DIAGNOSIS — Z203 Contact with and (suspected) exposure to rabies: Secondary | ICD-10-CM

## 2021-12-28 DIAGNOSIS — Z2914 Encounter for prophylactic rabies immune globin: Secondary | ICD-10-CM | POA: Diagnosis not present

## 2021-12-28 LAB — CBC
HCT: 42 % (ref 36.0–46.0)
Hemoglobin: 14.3 g/dL (ref 12.0–15.0)
MCH: 31.4 pg (ref 26.0–34.0)
MCHC: 34 g/dL (ref 30.0–36.0)
MCV: 92.3 fL (ref 80.0–100.0)
Platelets: 218 10*3/uL (ref 150–400)
RBC: 4.55 MIL/uL (ref 3.87–5.11)
RDW: 12.5 % (ref 11.5–15.5)
WBC: 6.1 10*3/uL (ref 4.0–10.5)
nRBC: 0 % (ref 0.0–0.2)

## 2021-12-28 LAB — BASIC METABOLIC PANEL
Anion gap: 7 (ref 5–15)
BUN: 15 mg/dL (ref 6–20)
CO2: 25 mmol/L (ref 22–32)
Calcium: 8.7 mg/dL — ABNORMAL LOW (ref 8.9–10.3)
Chloride: 107 mmol/L (ref 98–111)
Creatinine, Ser: 0.91 mg/dL (ref 0.44–1.00)
GFR, Estimated: >60 mL/min (ref >60)
Glucose, Bld: 95 mg/dL (ref 70–99)
Potassium: 4.2 mmol/L (ref 3.5–5.1)
Sodium: 139 mmol/L (ref 135–145)

## 2021-12-28 MED ORDER — LIDOCAINE-EPINEPHRINE-TETRACAINE (LET) TOPICAL GEL
3.0000 mL | Freq: Once | TOPICAL | Status: AC
  Administered 2021-12-28: 3 mL via TOPICAL
  Filled 2021-12-28: qty 3

## 2021-12-28 MED ORDER — CEPHALEXIN 500 MG PO CAPS: 500.0000 mg | ORAL_CAPSULE | Freq: Four times a day (QID) | ORAL | 0 refills | Status: AC

## 2021-12-28 MED ORDER — RABIES VACCINE, PCEC IM SUSR
1.0000 mL | Freq: Once | INTRAMUSCULAR | Status: AC
  Administered 2021-12-28: 1 mL via INTRAMUSCULAR
  Filled 2021-12-28 (×2): qty 1

## 2021-12-28 NOTE — ED Provider Triage Note (Signed)
Emergency Medicine Provider Triage Evaluation Note ? ?Madison Savage , a 51 y.o. female  was evaluated in triage.  Pt complains of increased pain and swelling to an injury on her right forearm.  The patient reports that last 4/18 she was bitten by dog in her neighborhood.  Patient states that she was placed on Bactrim as well as metronidazole but failed outpatient therapy.  Patient presented to [facility] ED on 4/20 for complaint of worsening wound infection and was admitted for IV antibiotic management.  Patient states that she was discharged on 4/22 and since then has had increased redness and swelling to the area in question.  Patient states that she has been febrile at home, body aches and chills, chest tightness.  Patient had ultrasound conducted on Friday which was unrevealing.  Patient also reports that she needs second rabies vaccination. ? ?Review of Systems  ?Positive:  ?Negative:  ? ?Physical Exam  ?BP (!) 165/116 (BP Location: Right Leg)   Pulse 83   Temp 98.5 ?F (36.9 ?C) (Oral)   Resp 16   Ht '5\' 8"'$  (1.727 m)   Wt 56 kg   SpO2 97%   BMI 18.77 kg/m?  ?Gen:   Awake, no distress   ?Resp:  Normal effort  ?MSK:   Moves extremities without difficulty  ?Other:  Sutured wound to right forearm with surrounding erythema, induration.  No drainage noted. ? ?Medical Decision Making  ?Medically screening exam initiated at 12:26 PM.  Appropriate orders placed.  Madison Savage was informed that the remainder of the evaluation will be completed by another provider, this initial triage assessment does not replace that evaluation, and the importance of remaining in the ED until their evaluation is complete. ? ? ?  ?Azucena Cecil, PA-C ?12/28/21 1229 ? ?

## 2021-12-28 NOTE — Discharge Instructions (Addendum)
It was a pleasure caring for you today in the emergency department. ° °Please return to the emergency department for any worsening or worrisome symptoms. ° ° °

## 2021-12-28 NOTE — ED Triage Notes (Signed)
Patient was attacked by a dog last Tuesday and admitted to hospital over the weekend for cellulitis to right arm. Patient states redness in moving up into arm. Also needs 2nd rabies shot and stitches removed.  ?

## 2021-12-28 NOTE — ED Provider Notes (Signed)
?[facility] ?Provider Note ? ? ?CSN: [id number] ?Arrival date & time: 12/28/21  1127 ? ?  ? ?History ? ?Chief Complaint  ?Patient presents with  ? Cellulitis  ? Suture / Staple Removal  ? Rabies Injection  ? ? ?Madison Savage is a 51 y.o. female. ? ? Patient as above with significant medical history as below, including anxiety, MTHFR mutation, breast cancer/ovarian cancer who presents to the ED with complaint of wound check, suture removal ? ? ?Patient suffered a dog bite a week ago, she was hospitalized short-term for this.  She is continue to take her antibiotics at home.  She is having some diarrhea and upset stomach associate with antibiotic use.  She is having some pain to her right arm, minimal swelling at the forearm.  She has some pain on arm movement has not acutely worsened over the past few days.  Patient does report ongoing pain to her forearm, she does not like using her analgesics that she is prescribed.  She tried Motrin/Tylenol which did not "make a difference."  She was seen urgent care earlier today and sent to the ER for evaluation.  ? ? ?She is  also due for her next rabies shot and suture removal. ? ? ? ?Past Medical History: ?No date: Anemia ?No date: Anxiety ?No date: BRCA2 positive ?No date: Chronic ITP (idiopathic thrombocytopenia) (HCC) ?No date: Family history of breast cancer ?No date: Family history of ovarian cancer ?No date: Headache ?    Comment:  last migraine...9/24 ?No date: History of cancer chemotherapy ?    Comment:  left breast 06-09-2017  to 11-16-2017 ?No date: History of cardiac murmur as a child ?    Comment:  infant ?No date: History of external beam radiation therapy ?    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    Comment:  dx 03-22-2017--- DCIS Grade  II/III (pT1c pN2a pMX)  with ?             mets to axilla lymph nodes/  04-27-2018  s/p  bilateral  ?             mastectomy with left sln dissection's with  ?             reconstruction,  completed chemothearpy 11-16-2017,   ?             completed radiation 02-19-2018,  started tamoxifen 07/  ?             2019 ?No date: Wears contact lenses ? ?Past Surgical History: ?04/27/2017: AXILLARY LYMPH NODE DISSECTION; Left ?    Comment:  Procedure: AXILLARY LYMPH NODE DISSECTION;  Surgeon:  ?             [name], MD;  Location: [facility];  ?             Service: General;  Laterality: Left; ?04/27/2017: BILATERAL TOTAL MASTECTOMY WITH AXILLARY LYMPH NODE  ?DISSECTION; Bilateral ?    Comment:  Procedure: SKIN SPARING MASTECTOMY;  Surgeon: Barry Dienes,  ?             Dorris Fetch, MD;  Location: [place];   ?             Service: General;  Laterality: Bilateral; ?  12/06/2017: BREAST CAPSULECTOMY WITH IMPLANT EXCHANGE; Bilateral ?    Comment:  Procedure: BREAST CAPSULECTOMY WITH IMPLANT EXCHANGE;   ?             Surgeon: Wallace Going, DO;  Location: [place]  ?             S[facility]ERY [facility]ENTER;  Service: Plastics;  Laterality:  ?             Bilateral; ?04/27/2017: BREAST RECONSTRUCTION WITH PLACEMENT OF TISSUE EXPANDER  ?AND FLEX HD (ACELLULAR HYDRATED DERMIS); Bilateral ?    Comment:  Procedure: BILATERAL BREAST RECONSTRUCTION WITH  ?             PLACEMENT OF TISSUE EXPANDER AND FLEX HD (ACELLULAR  ?             HYDRATED DERMIS);  Surgeon: Wallace Going, DO;   ?             Location: [facility];  Service: Plastics;  ?             Laterality: Bilateral; ?No date: MASTECTOMY; Bilateral ?12/06/2017: PLACEMENT OF BREAST IMPLANTS; Bilateral ?    Comment:  Procedure: PLACEMENT OF BREAST IMPLANTS;  Surgeon:  ?             Wallace Going, DO;  Location: [facility]  ?             [facility];  Service: Clinical cytogeneticist;  Laterality: Bilateral; ?12/06/2017: PORT-A-CATH REMOVAL; Right ?     Comment:  Procedure: REMOVAL PORT-A-CATH;  Surgeon: Dillingham,  ?             Loel Lofty, DO;  Location: [facility];   ?             Service: Plastics;  Laterality: Right; ?06/01/2017: PORTACATH PLACEMENT; Right ?    Comment:  Procedure: INSERTION PORT-A-CATH RIGHT SUBCLAVIAN;   ?             Surgeon: [name], MD;  Location: [place];  Service:  ?             General;  Laterality: Right; ?08/31/2018: ROBOTIC ASSISTED SALPINGO OOPHERECTOMY; Bilateral ?    Comment:  Procedure: XI ROBOTIC ASSISTED BILATERAL  SALPINGO  ?             OOPHORECTOMY;  Surgeon: Everitt Amber, MD;  Location:  ?             [facility];  Service: Gynecology;   ?             Laterality: Bilateral; ?No date: WISDOM TOOTH EXTRACTION  ? ? ?The history is provided by the patient. No language interpreter was used.  ?Suture / Staple Removal ?Pertinent negatives include no chest pain, no abdominal pain, no headaches and no shortness of breath.  ? ?  ? ?Home Medications ?Prior to Admission medications   ?Medication Sig Start Date End Date Taking? Authorizing Provider  ?cephALEXin (KEFLEX) 500 MG capsule Take 1 capsule (500 mg total) by mouth 4 (four) times daily for 3 days. 12/30/21 01/02/22 Yes Wynona Dove A, DO  ?ascorbic acid (VITAMIN C) 500 MG tablet Take by mouth.    [provider]  ?b complex vitamins capsule Take 1 capsule by mouth daily.    [provider]  ?cephALEXin (KEFLEX) 500 MG capsule Take 1 capsule (500 mg total) by mouth 4 (four) times daily for 5 days. 12/25/21  12/30/21  Shelly Coss, MD  ?Cholecalciferol (VITAMIN D-3 PO) Take 1 capsule by mouth daily.    [provider]  ?gabapentin (NEURONTIN) 100 MG capsule Take 3 capsules (300 mg total) by mouth at bedtime. 11/08/21   [name], MD  ?MAGNESIUM PO Take 1 tablet by mouth daily. Take Muscle cramps    [provider]  ?metroNIDAZOLE (FLAGYL) 500 MG tablet Take 1 tablet (500 mg total) by mouth 2 (two) times daily for 5  days. 12/25/21 12/30/21  Shelly Coss, MD  ?Misc Natural Products (CALCIUM PLUS ADVANCED PO) Take 1 tablet by mouth daily.    [provider]  ?OVER THE COUNTER MEDICATION Take 1 capsule by mouth daily. Biotin/MSM    [provider]  ?oxyCODONE (OXY IR/ROXICODONE) 5 MG immediate release tablet Take 1 tablet (5 mg total) by mouth every 6 (six) hours as needed for moderate pain or breakthrough pain. 12/25/21   Shelly Coss, MD  ?tamoxifen (NOLVADEX) 20 MG tablet Take 1 tablet (20 mg total) by mouth daily. 11/08/21   [name], MD  ?   ? ?Allergies    ?Clindamycin, Contrast media [iodinated contrast media], Penicillins, and Codeine   ? ?Review of Systems   ?Review of Systems  ?Constitutional:  Negative for chills and fever.  ?HENT:  Negative for facial swelling and trouble swallowing.   ?Eyes:  Negative for photophobia and visual disturbance.  ?Respiratory:  Negative for cough and shortness of breath.   ?Cardiovascular:  Negative for chest pain and palpitations.  ?Gastrointestinal:  Positive for nausea. Negative for abdominal pain and vomiting.  ?Endocrine: Negative for polydipsia and polyuria.  ?Genitourinary:  Negative for difficulty urinating and hematuria.  ?Musculoskeletal:  Negative for gait problem and joint swelling.  ?Skin:  Positive for wound. Negative for pallor and rash.  ?Neurological:  Negative for syncope and headaches.  ?Psychiatric/Behavioral:  Negative for agitation and confusion.   ? ?Physical Exam ?Updated Vital Signs ?BP (!) 165/116 (BP Location: Right Leg)   Pulse 83   Temp 98.5 ?F (36.9 ?C) (Oral)   Resp 16   Ht $R'5\' 8"'qA$  (1.727 m)   Wt 56 kg   SpO2 97%   BMI 18.77 kg/m?  ?Physical Exam ?Vitals and nursing note reviewed.  ?Constitutional:   ?   General: She is not in acute distress. ?   Appearance: Normal appearance.  ?HENT:  ?   Head: Normocephalic and atraumatic.  ?   Right Ear: External ear normal.  ?   Left Ear: External ear normal.  ?   Nose: Nose normal.  ?    Mouth/Throat:  ?   Mouth: Mucous membranes are moist.  ?Eyes:  ?   General: No scleral icterus.    ?   Right eye: No discharge.     ?   Left eye: No discharge.  ?Cardiovascular:  ?   Rate and Rhythm: Normal rate and r

## 2021-12-29 LAB — CULTURE, BLOOD (ROUTINE X 2)
Culture: NO GROWTH
Culture: NO GROWTH
Special Requests: ADEQUATE

## 2022-01-04 DIAGNOSIS — S51851D Open bite of right forearm, subsequent encounter: Secondary | ICD-10-CM | POA: Diagnosis not present

## 2022-01-04 DIAGNOSIS — W540XXD Bitten by dog, subsequent encounter: Secondary | ICD-10-CM | POA: Diagnosis not present

## 2022-01-08 DIAGNOSIS — Z5189 Encounter for other specified aftercare: Secondary | ICD-10-CM | POA: Diagnosis not present

## 2022-01-08 DIAGNOSIS — R29898 Other symptoms and signs involving the musculoskeletal system: Secondary | ICD-10-CM | POA: Diagnosis not present

## 2022-01-11 DIAGNOSIS — R29898 Other symptoms and signs involving the musculoskeletal system: Secondary | ICD-10-CM | POA: Diagnosis not present

## 2022-01-11 DIAGNOSIS — M79641 Pain in right hand: Secondary | ICD-10-CM | POA: Diagnosis not present

## 2022-01-18 DIAGNOSIS — R29898 Other symptoms and signs involving the musculoskeletal system: Secondary | ICD-10-CM | POA: Diagnosis not present

## 2022-01-18 DIAGNOSIS — M79641 Pain in right hand: Secondary | ICD-10-CM | POA: Diagnosis not present

## 2022-01-25 DIAGNOSIS — M79641 Pain in right hand: Secondary | ICD-10-CM | POA: Diagnosis not present

## 2022-01-25 DIAGNOSIS — R29898 Other symptoms and signs involving the musculoskeletal system: Secondary | ICD-10-CM | POA: Diagnosis not present

## 2022-02-01 DIAGNOSIS — R29898 Other symptoms and signs involving the musculoskeletal system: Secondary | ICD-10-CM | POA: Diagnosis not present

## 2022-02-01 DIAGNOSIS — M79641 Pain in right hand: Secondary | ICD-10-CM | POA: Diagnosis not present

## 2022-02-08 DIAGNOSIS — R29898 Other symptoms and signs involving the musculoskeletal system: Secondary | ICD-10-CM | POA: Diagnosis not present

## 2022-02-08 DIAGNOSIS — M79641 Pain in right hand: Secondary | ICD-10-CM | POA: Diagnosis not present

## 2022-03-04 DIAGNOSIS — R29898 Other symptoms and signs involving the musculoskeletal system: Secondary | ICD-10-CM | POA: Diagnosis not present

## 2022-03-04 DIAGNOSIS — M79641 Pain in right hand: Secondary | ICD-10-CM | POA: Diagnosis not present

## 2022-03-09 DIAGNOSIS — M79641 Pain in right hand: Secondary | ICD-10-CM | POA: Diagnosis not present

## 2022-03-09 DIAGNOSIS — R29898 Other symptoms and signs involving the musculoskeletal system: Secondary | ICD-10-CM | POA: Diagnosis not present

## 2022-03-10 DIAGNOSIS — R7989 Other specified abnormal findings of blood chemistry: Secondary | ICD-10-CM | POA: Diagnosis not present

## 2022-03-14 DIAGNOSIS — M79641 Pain in right hand: Secondary | ICD-10-CM | POA: Diagnosis not present

## 2022-03-14 DIAGNOSIS — L905 Scar conditions and fibrosis of skin: Secondary | ICD-10-CM | POA: Diagnosis not present

## 2022-03-14 DIAGNOSIS — R29898 Other symptoms and signs involving the musculoskeletal system: Secondary | ICD-10-CM | POA: Diagnosis not present

## 2022-03-17 DIAGNOSIS — Z1339 Encounter for screening examination for other mental health and behavioral disorders: Secondary | ICD-10-CM | POA: Diagnosis not present

## 2022-03-17 DIAGNOSIS — Z1331 Encounter for screening for depression: Secondary | ICD-10-CM | POA: Diagnosis not present

## 2022-03-17 DIAGNOSIS — Z0000 Encounter for general adult medical examination without abnormal findings: Secondary | ICD-10-CM | POA: Diagnosis not present

## 2022-03-22 DIAGNOSIS — M79641 Pain in right hand: Secondary | ICD-10-CM | POA: Diagnosis not present

## 2022-03-22 DIAGNOSIS — Z9013 Acquired absence of bilateral breasts and nipples: Secondary | ICD-10-CM | POA: Diagnosis not present

## 2022-03-22 DIAGNOSIS — L905 Scar conditions and fibrosis of skin: Secondary | ICD-10-CM | POA: Diagnosis not present

## 2022-03-22 DIAGNOSIS — R29898 Other symptoms and signs involving the musculoskeletal system: Secondary | ICD-10-CM | POA: Diagnosis not present

## 2022-03-31 DIAGNOSIS — M79641 Pain in right hand: Secondary | ICD-10-CM | POA: Diagnosis not present

## 2022-03-31 DIAGNOSIS — R29898 Other symptoms and signs involving the musculoskeletal system: Secondary | ICD-10-CM | POA: Diagnosis not present

## 2022-03-31 DIAGNOSIS — L905 Scar conditions and fibrosis of skin: Secondary | ICD-10-CM | POA: Diagnosis not present

## 2022-04-06 DIAGNOSIS — L259 Unspecified contact dermatitis, unspecified cause: Secondary | ICD-10-CM | POA: Diagnosis not present

## 2022-05-09 NOTE — Progress Notes (Unsigned)
Madison Savage   [phone] Fax:([phone]) [phone]   Clinic Follow up Note   Patient Care Team: Michael Boston, MD as PCP - General (Internal Medicine) [name], MD as Consulting Physician (General Surgery) [name], MD as Consulting Physician (Hematology) Kyung Rudd, MD as Consulting Physician (Radiation Oncology) Brien Few, MD as Consulting Physician (Obstetrics and Gynecology) Delice Bison, Charlestine Massed, NP as Nurse Practitioner (Hematology and Oncology) [name], MD as Consulting Physician (Hematology) 05/11/2022  CHIEF COMPLAINT: Follow up BRCA2+ left breast cancer   SUMMARY OF ONCOLOGIC HISTORY: Oncology History Overview Note  Cancer Staging Breast cancer metastasized to axillary lymph node, left [facility]) Staging form: Breast, AJCC 8th Edition - Clinical stage from 03/22/2017: Stage Unknown (cTX, cN1, cM0, GX, ER: Positive, PR: Positive, HER2: Negative) - Signed by [name], MD on 03/29/2017 - Pathologic stage from 04/27/2017: Stage IB (pT1c, pN2a, cM0, G2, ER: Positive, PR: Positive, HER2: Negative) - Signed by [name], MD on 05/25/2017 \   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 ([place])  03/21/2017 Mammogram   Diagnostic mammogram bilateral 03/21/17 IMPRESSION: INCOMPLETE - ADDITIONAL IMAGING EVALUATION NEEDED 1. The 0.6 cm oval mass in the right breast at 11 o'clock middle depth is indeterminate. An ultrasound is recommended.  2. The 0.5cm round focal asymmetry in the left breast central to the nipple middle depth is indeterminate. An ultrasound is recommended.  3. Ultrasound of the palpable abnormalities in the axilla and far left lateral breast is recommended.  4. Multiple clusters of pleomorphic calcifications in the left upper outer breast anterior depth spanning a 2.5 cm area are highly suggestive of malignancy. A stereotactic biopsy is recommended.     03/21/2017 Imaging   US Breast bilateral 03/21/17 IMPRESSION:  HIGHLY SUGGESTIVE OF MALIGNANCY 1. Six distinct abnormal left axillary nodes, 2 of which correspond to the palpable lumps felt by the patient. Findings concerning for metastatic adenopathy. Ultrasound guided biopsy of one of these nodes is recommended.  2. Small 5 mm palpable, oval mass in the left breast 3:00 areolar margin is at an intermediate suspicion. Ultrasound guided biopsy is recommended.  3. Isoechoic 8 mm mass in the right breast 10:00 5 cm from the nipple is also an intermediate suspicion for malignancy, and ultrasound guided biopsy is recommended.  4. Stereotactic guided biopsy for the highly suspicious calcifications in the left breast also recommended as detailed in the mammography report.     03/22/2017 Initial Biopsy   Diagnosis 03/22/17 Lymph node, needle/core biopsy, left axillary node -METASTATIC CARCINOMA, SEE COMMENT Microscopic Comment   03/22/2017 Receptors her2   Lymph node biopsy ER 95% positive, strong staining, PR 5% positive, weak staining, HER-2 negative, with HER2/CEP17 ratio 1.23 and copy #3.95.   03/27/2017 Pathology Results   Diagnosis 03/27/17 1. Breast, left, needle core biopsy -FIBROADENOMATOID NODULE WITH CALCIFICATIONS 2.Breast, left, needle core biopsy -DUCTAL CARCINOMA IN SITU, HIGH GRADE -SEE COMMENT  3. Breast, right, needle, core biopsy -FIBROADENOMA -NO MALIGNANCY IDENTIFIED    03/29/2017 Initial Diagnosis   Breast cancer metastasized to axillary lymph node, left ([place])   04/07/2017 Imaging   CT CAP 04/07/17 IMPRESSION: 1. Several prominent left axillary lymph nodes which may correspond to biopsy proven axillary lymph node metastasis. 2. No thoracic adenopathy identified or evidence of distant metastatic disease.   04/07/2017 Imaging   Bone whole body scan 04/07/17 IMPRESSION: Negative for evidence of osseous metastatic disease.   04/17/2017 Genetic Testing   BRCA2 c.[id number] (p.Glu260Serfs*15) pathogenic mutation identified  on the 9 gene  STAT panel.  The STAT Breast cancer panel offered by [facility] includes sequencing and rearrangement analysis for the following 9 genes:  ATM, BRCA1, BRCA2, CDH1, CHEK2, PALB2, PTEN, STK11 and TP53.   The report date is April 17, 2017.  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  The Hereditary Gene Panel offered by [facility] includes sequencing and/or deletion duplication testing of the following 46 genes: APC, ATM, AXIN2, BARD1, BMPR1A, BRCA1, BRCA2, BRIP1, CDH1, CDKN2A (p14ARF), CDKN2A (p16INK4a), CHEK2, CTNNA1, DICER1, EPCAM (Deletion/duplication testing only), GREM1 (promoter region deletion/duplication testing only), KIT, MEN1, MLH1, MSH2, MSH3, MSH6, MUTYH, NBN, NF1, NHTL1, PALB2, PDGFRA, PMS2, POLD1, POLE, PTEN, RAD50, RAD51C, RAD51D, SDHB, SDHC, SDHD, SMAD4, SMARCA4. STK11, TP53, TSC1, TSC2, and VHL.  The following genes were evaluated for sequence changes only: SDHA and HOXB13 c.251G>A variant only.  The report date is April 29, 2017.    04/27/2017 Surgery   Surgey 04/27/17 BILATERAL SKIN SPARING MASTECTOMY and LEFT AXILLARY LYMPH NODE and BILATERAL BREAST RECONSTRUCTION WITH PLACEMENT OF TISSUE EXPANDER AND FLEX HD (ACELLULAR HYDRATED DERMIS) Bilateral BY Dr. Barry Dienes and Dr. Marla Roe    04/27/2017 Pathology Results    Diagnosis  04/27/17 1. Breast, simple mastectomy, Right - FIBROCYSTIC CHANGES WITH ADENOSIS. - USUAL DUCTAL HYPERPLASIA. - HEALING BIOPSY SITE. - THERE IS NO EVIDENCE OF MALIGNANCY. 2. Breast, simple mastectomy, Left - INVASIVE DUCTAL CARCINOMA, GRADE II/III, SPANNING 1.2 CM. - DUCTAL CARCINOMA IN SITU WITH CALCIFICATIONS, HIGH GRADE. - INVASIVE DUCTAL CARCINOMA IS FOCALLY PRESENT AT THE ANTERIOR MARGIN. - DUCTAL CARCINOMA IN SITU IS BROADLY 0.1 CM TO THE POSTERIOR MARGIN. - SEE ONCOLOGY TABLE BELOW. 3. Lymph nodes, regional resection, Left axillary - METASTATIC  CARCINOMA IN 8 OF 21 LYMPH NODES (8/21). - SEE COMMENT   06/09/2017 - 11/16/2017 Chemotherapy   Adriamycin and Cytoxan every 2 weeks, for 4 cycles 06/09/17-07/21/17, followed by weekly Abraxane and carboplatin for 12 weeks starting 08/11/17. Held Carbo with cycle 2 due to poor toleration. Changed to single agent Abraxane for Cycle 3 on 09/08/17. Completed on 11/16/17 with reduced Abraxane due to body aches.    12/06/2017 Surgery   BREAST CAPSULECTOMY WITH IMPLANT EXCHANGE and PLACEMENT OF BREAST IMPLANTS and REMOVAL PORT-A-CATH  by Dr. Marla Roe on 12/06/17   12/18/2017 Survivorship   Survivorship clinic with NP Mendel Ryder    01/03/2018 - 02/19/2018 Radiation Therapy   Radiation therapy 01/03/18 - 02/19/18.   03/2018 -  Anti-estrogen oral therapy   Adjuvant tamoxifen started in July 2019, switched to letrozole started 09/2018 after BSO but due to allergy reaction with hives, ithcing and swelling she d/c and restarted Tamoxifen.     08/31/2018 Surgery   08/31/2018 BSO With Dr. Denman George  Diagnosis 1. Ovary and fallopian tube, right RIGHT OVARY AND RIGHT FALLOPIAN TUBE: - UNREMARKABLE. - NO ENDOMETRIOSIS OR MALIGNANCY. 2. Ovary and fallopian tube, left LEFT OVARY AND LEFT FALLOPIAN TUBE: - UNREMARKABLE. - NO ENDOMETRIOSIS OR MALIGNANCY.     CURRENT THERAPY: Tamoxifen, starting 03/2018  INTERVAL HISTORY: Ms. Vanorder returns for follow up as scheduled. Last seen by Dr. Burr Medico 11/08/21. She had dog bite earlier this year and was hospitalized for cellulitis.  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 She continues tamoxifen, tolerating well without any side effects. She attributes occasional mood lability to eating late/inconsistently.  She has lost weight intentionally by stopping wine.  She plans  to see dermatology for a rash that developed this summer after [place], also questions if she has egg allergy.  Continues to  exercise daily and remain very active and overall in good health.  Has not seen OB/GYN since BSO.  Also notes that she has been referred to GI twice but has not heard from [place].  Denies abnormal vaginal bleeding, breast concerns, bone or joint pain, change in bowel habits, pain, or any other specific complaints.  All other systems were reviewed with the patient and are negative.  MEDICAL HISTORY:  Past Medical History:  Diagnosis Date   Anemia    Anxiety    BRCA2 positive    Chronic ITP (idiopathic thrombocytopenia) (HCC)    Family history of breast cancer    Family history of ovarian cancer    Headache    last migraine...9/24   History of cancer chemotherapy    left breast 06-09-2017  to 11-16-2017   History of cardiac murmur as a child    infant   History of external beam radiation therapy    left breast   01-03-2018  to 02-19-2018   MTHFR mutation    PONV (postoperative nausea and vomiting)    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 [facility]) oncologist-- dr Burr Medico   dx 03-22-2017--- 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/  04-27-2018  s/p  bilateral mastectomy with left sln dissection's with reconstruction,  completed chemothearpy 11-16-2017,  completed radiation 02-19-2018,  started tamoxifen 07/ 2019   Wears contact lenses     SURGICAL HISTORY: Past Surgical History:  Procedure Laterality Date   AXILLARY LYMPH NODE DISSECTION Left 04/27/2017   Procedure: AXILLARY LYMPH NODE DISSECTION;  Surgeon: [name], MD;  Location: [place];  Service: General;  Laterality: Left;   BILATERAL TOTAL MASTECTOMY WITH AXILLARY LYMPH NODE DISSECTION Bilateral 04/27/2017   Procedure: SKIN SPARING MASTECTOMY;  Surgeon: [name], MD;  Location: [place];  Service: General;  Laterality: Bilateral;   BREAST CAPSULECTOMY WITH IMPLANT EXCHANGE Bilateral 12/06/2017   Procedure: BREAST CAPSULECTOMY WITH IMPLANT  EXCHANGE;  Surgeon: Wallace Going, DO;  Location: [place];  Service: Plastics;  Laterality: Bilateral;   BREAST RECONSTRUCTION WITH PLACEMENT OF TISSUE EXPANDER AND FLEX HD (ACELLULAR HYDRATED DERMIS) Bilateral 04/27/2017   Procedure: BILATERAL BREAST RECONSTRUCTION WITH PLACEMENT OF TISSUE EXPANDER AND FLEX HD (ACELLULAR HYDRATED DERMIS);  Surgeon: Wallace Going, DO;  Location: [place];  Service: Plastics;  Laterality: Bilateral;   MASTECTOMY Bilateral    PLACEMENT OF BREAST IMPLANTS Bilateral 12/06/2017   Procedure: PLACEMENT OF BREAST IMPLANTS;  Surgeon: Wallace Going, DO;  Location: [place];  Service: Plastics;  Laterality: Bilateral;   PORT-A-CATH REMOVAL Right 12/06/2017   Procedure: REMOVAL PORT-A-CATH;  Surgeon: Wallace Going, DO;  Location: [place];  Service: Plastics;  Laterality: Right;   PORTACATH PLACEMENT Right 06/01/2017   Procedure: INSERTION PORT-A-CATH RIGHT SUBCLAVIAN;  Surgeon: [name], MD;  Location: [place];  Service: General;  Laterality: Right;   ROBOTIC ASSISTED SALPINGO OOPHERECTOMY Bilateral 08/31/2018   Procedure: XI ROBOTIC ASSISTED BILATERAL  SALPINGO OOPHORECTOMY;  Surgeon: Everitt Amber, MD;  Location: [place];  Service: Gynecology;  Laterality: Bilateral;   WISDOM TOOTH EXTRACTION      I have reviewed the social history and family history with the patient and they are unchanged from previous note.  ALLERGIES:  is allergic to clindamycin, contrast media [iodinated contrast media], penicillins, and codeine.  MEDICATIONS:  Current Outpatient Medications  Medication Sig Dispense Refill   ascorbic acid (VITAMIN C) 500 MG tablet Take by mouth.     b complex vitamins capsule Take 1 capsule by mouth daily.     Cholecalciferol (VITAMIN D-3 PO) Take 1 capsule by mouth daily.     gabapentin (NEURONTIN) 100 MG capsule Take 3 capsules (300 mg total) by mouth  at bedtime. 90 capsule 5   MAGNESIUM PO Take 1 tablet by mouth daily. Take Muscle cramps     Misc Natural Products (CALCIUM PLUS ADVANCED PO) Take 1 tablet by mouth daily.     OVER THE COUNTER MEDICATION Take 1 capsule by mouth daily. Biotin/MSM     oxyCODONE (OXY IR/ROXICODONE) 5 MG immediate release tablet Take 1 tablet (5 mg total) by mouth every 6 (six) hours as needed for moderate pain or breakthrough pain. 15 tablet 0   tamoxifen (NOLVADEX) 20 MG tablet Take 1 tablet (20 mg total) by mouth daily. 90 tablet 3   No current facility-administered medications for this visit.   Facility-Administered Medications Ordered in Other Visits  Medication Dose Route Frequency Provider Last Rate Last Admin   sodium chloride flush (NS) 0.9 % injection 10 mL  10 mL Intravenous PRN [name], MD   10 mL at 07/21/17 1323    PHYSICAL EXAMINATION: ECOG PERFORMANCE STATUS: 0 - Asymptomatic  Vitals:   05/11/22 0930  BP: 117/85  Pulse: 81  Resp: 15  Temp: 98.3 F (36.8 C)  SpO2: 98%   Filed Weights   05/11/22 0930  Weight: 119 lb 12.8 oz (54.3 kg)    GENERAL:alert, no distress and comfortable SKIN: No rash EYES: sclera clear NECK: Without mass LYMPH:  no palpable cervical or supraclavicular lymphadenopathy  LUNGS: normal breathing effort HEART:  no lower extremity edema ABDOMEN:abdomen soft, non-tender and normal bowel sounds NEURO: alert & oriented x 3 with fluent speech, no focal motor/sensory deficits Breast exam: S/p bilateral mastectomy and reconstruction, nipple surgically absent.  Incisions completely healed.  No palpable mass or nodularity along the chest wall, scars, or either axilla that I could appreciate  LABORATORY DATA:  I have reviewed the data as listed    Latest Ref Rng & Units 05/11/2022    9:16 AM 12/28/2021   12:34 PM 12/24/2021    3:05 AM  CBC  WBC 4.0 - 10.5 K/uL 5.3  6.1  6.6   Hemoglobin 12.0 - 15.0 g/dL 14.7  14.3  12.8   Hematocrit 36.0 - 46.0 % 41.9  42.0  37.0    Platelets 150 - 400 K/uL 188  218  150         Latest Ref Rng & Units 05/11/2022    9:16 AM 12/28/2021   12:34 PM 12/24/2021    3:05 AM  CMP  Glucose 70 - 99 mg/dL 101  95  112   BUN 6 - 20 mg/dL _0 Creatinine 0.44 - 1.00 mg/dL 1.03  0.91  0.96   Sodium 135 - 145 mmol/L 139  139  140   Potassium 3.5 - 5.1 mmol/L 4.0  4.2  3.9   Chloride 98 - 111 mmol/L 107  107  112   CO2 22 - 32 mmol/L _1 Calcium 8.9 - 10.3 mg/dL 9.3  8.7  8.4   Total Protein 6.5 - 8.1 g/dL  6.6   5.6   Total Bilirubin 0.3 - 1.2 mg/dL 0.4   0.7   Alkaline Phos 38 - 126 U/L 54   42   AST 15 - 41 U/L 15   13   ALT 0 - 44 U/L 11   9       RADIOGRAPHIC STUDIES: I have personally reviewed the radiological images as listed and agreed with the findings in the report. No results found.   ASSESSMENT & PLAN: 51 year old female   32. 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 BRCA 2 +. Treated with b/l mastectomy, adjuvant chemo and radiation.  -S/p BSO for BRCA2(+) she changed from tamoxifen to AI, but had allergic reaction and switched back to tamoxifen, tolerating well, goal 7-10 years -Ms. Kahler is clinically doing well.  Tolerating tamoxifen without significant side effects.  She is overdue for pelvic exam, she will contact OB/GYN.  Physical exam is benign, labs are unremarkable.  Overall no clinical concern for breast cancer recurrence. -She is over 5 years from initial diagnosis, the recurrence risk has decreased.  Continue surveillance and tamoxifen.  She would like to follow-up annually.  She agrees to stagger our visits with OB/GYN with breast exam every 6 months, I am comfortable with that -No role for mammograms given bilateral mastectomy -Follow-up in 1 year, or sooner if needed   2.  BRCA2 positive -S/p bilateral mastectomy and BSO (09/24/2018) per Dr. Denman George -Her oldest  son Glennon Mac age 19 underwent testing and is BRCA positive, he has met with a genetic counselor -Her 41 year old daughter is ready to test but patient prefers for her to wait until she has graduated from high school.  She will contact [facility] for testing when appropriate -She has been referred to GI twice for first colonoscopy, has not been contacted.  I will ask my nurse to follow-up   3.  ITP -Found during pregnancy of her youngest child -CBC normal lately  4.  Osteopenia -DEXA 02/12/2021 showed osteopenia, T score -1.6 at LFN -Continue calcium, vitamin D, weightbearing exercise, and tamoxifen -Repeat 02/2023  Plan: -Labs reviewed -Continue breast cancer surveillance and tamoxifen -OB/GYN follow-up in 6 months with pelvic and breast exam, I cc'd my note  -Next DEXA 02/2023 -See [place] back in 1 year, or sooner if needed -Follow-up on GI referral   Orders Placed This Encounter  Procedures   DG Bone Density    Standing Status:   Future    Standing Expiration Date:   05/11/2023    Order Specific Question:   Reason for Exam (SYMPTOM  OR DIAGNOSIS REQUIRED)    Answer:   Osteopenia.  S/p chemo, BSO.  On tamoxifen    Order Specific Question:   Is the patient pregnant?    Answer:   No    Order Specific Question:   Preferred imaging location?    Answer:   [facility]   All questions were answered. The patient knows to call the clinic with any problems, questions or concerns. No barriers to learning was detected. I spent 20 minutes counseling the patient face to face. The total time spent in the appointment was 30 minutes and more than 50% was on counseling and review of test results     Alla Feeling, NP 05/11/22

## 2022-05-11 ENCOUNTER — Inpatient Hospital Stay: Payer: BC Managed Care – PPO

## 2022-05-11 ENCOUNTER — Other Ambulatory Visit: Payer: Self-pay

## 2022-05-11 ENCOUNTER — Encounter: Payer: Self-pay | Admitting: Nurse Practitioner

## 2022-05-11 ENCOUNTER — Inpatient Hospital Stay: Payer: BC Managed Care – PPO | Attending: Nurse Practitioner | Admitting: Nurse Practitioner

## 2022-05-11 VITALS — BP 117/85 | HR 81 | Temp 98.3°F | Resp 15 | Wt 119.8 lb

## 2022-05-11 DIAGNOSIS — Z1502 Genetic susceptibility to malignant neoplasm of ovary: Secondary | ICD-10-CM | POA: Insufficient documentation

## 2022-05-11 DIAGNOSIS — C50412 Malignant neoplasm of upper-outer quadrant of left female breast: Secondary | ICD-10-CM | POA: Diagnosis not present

## 2022-05-11 DIAGNOSIS — G629 Polyneuropathy, unspecified: Secondary | ICD-10-CM | POA: Diagnosis not present

## 2022-05-11 DIAGNOSIS — Z90722 Acquired absence of ovaries, bilateral: Secondary | ICD-10-CM | POA: Diagnosis not present

## 2022-05-11 DIAGNOSIS — Z1501 Genetic susceptibility to malignant neoplasm of breast: Secondary | ICD-10-CM | POA: Diagnosis not present

## 2022-05-11 DIAGNOSIS — Z9013 Acquired absence of bilateral breasts and nipples: Secondary | ICD-10-CM | POA: Insufficient documentation

## 2022-05-11 DIAGNOSIS — R4586 Emotional lability: Secondary | ICD-10-CM | POA: Diagnosis not present

## 2022-05-11 DIAGNOSIS — C773 Secondary and unspecified malignant neoplasm of axilla and upper limb lymph nodes: Secondary | ICD-10-CM | POA: Diagnosis not present

## 2022-05-11 DIAGNOSIS — Z79810 Long term (current) use of selective estrogen receptor modulators (SERMs): Secondary | ICD-10-CM | POA: Insufficient documentation

## 2022-05-11 DIAGNOSIS — Z923 Personal history of irradiation: Secondary | ICD-10-CM | POA: Insufficient documentation

## 2022-05-11 DIAGNOSIS — Z79899 Other long term (current) drug therapy: Secondary | ICD-10-CM | POA: Diagnosis not present

## 2022-05-11 DIAGNOSIS — M8580 Other specified disorders of bone density and structure, unspecified site: Secondary | ICD-10-CM | POA: Insufficient documentation

## 2022-05-11 DIAGNOSIS — D693 Immune thrombocytopenic purpura: Secondary | ICD-10-CM | POA: Insufficient documentation

## 2022-05-11 DIAGNOSIS — Z9221 Personal history of antineoplastic chemotherapy: Secondary | ICD-10-CM | POA: Diagnosis not present

## 2022-05-11 DIAGNOSIS — Z1321 Encounter for screening for nutritional disorder: Secondary | ICD-10-CM

## 2022-05-11 DIAGNOSIS — Z1509 Genetic susceptibility to other malignant neoplasm: Secondary | ICD-10-CM | POA: Diagnosis not present

## 2022-05-11 DIAGNOSIS — Z170 Estrogen receptor positive status [ER+]: Secondary | ICD-10-CM | POA: Insufficient documentation

## 2022-05-11 DIAGNOSIS — L814 Other melanin hyperpigmentation: Secondary | ICD-10-CM | POA: Diagnosis not present

## 2022-05-11 DIAGNOSIS — L309 Dermatitis, unspecified: Secondary | ICD-10-CM | POA: Diagnosis not present

## 2022-05-11 LAB — COMPREHENSIVE METABOLIC PANEL
ALT: 11 U/L (ref 0–44)
AST: 15 U/L (ref 15–41)
Albumin: 4.3 g/dL (ref 3.5–5.0)
Alkaline Phosphatase: 54 U/L (ref 38–126)
Anion gap: 4 — ABNORMAL LOW (ref 5–15)
BUN: 18 mg/dL (ref 6–20)
CO2: 28 mmol/L (ref 22–32)
Calcium: 9.3 mg/dL (ref 8.9–10.3)
Chloride: 107 mmol/L (ref 98–111)
Creatinine, Ser: 1.03 mg/dL — ABNORMAL HIGH (ref 0.44–1.00)
GFR, Estimated: >60 mL/min (ref >60)
Glucose, Bld: 101 mg/dL — ABNORMAL HIGH (ref 70–99)
Potassium: 4 mmol/L (ref 3.5–5.1)
Sodium: 139 mmol/L (ref 135–145)
Total Bilirubin: 0.4 mg/dL (ref 0.3–1.2)
Total Protein: 6.6 g/dL (ref 6.5–8.1)

## 2022-05-11 LAB — CBC WITH DIFFERENTIAL/PLATELET
Abs Immature Granulocytes: 0.01 10*3/uL (ref 0.00–0.07)
Basophils Absolute: 0 10*3/uL (ref 0.0–0.1)
Basophils Relative: 1 %
Eosinophils Absolute: 0.2 10*3/uL (ref 0.0–0.5)
Eosinophils Relative: 4 %
HCT: 41.9 % (ref 36.0–46.0)
Hemoglobin: 14.7 g/dL (ref 12.0–15.0)
Immature Granulocytes: 0 %
Lymphocytes Relative: 31 %
Lymphs Abs: 1.6 10*3/uL (ref 0.7–4.0)
MCH: 32 pg (ref 26.0–34.0)
MCHC: 35.1 g/dL (ref 30.0–36.0)
MCV: 91.1 fL (ref 80.0–100.0)
Monocytes Absolute: 0.6 10*3/uL (ref 0.1–1.0)
Monocytes Relative: 11 %
Neutro Abs: 2.8 10*3/uL (ref 1.7–7.7)
Neutrophils Relative %: 53 %
Platelets: 188 10*3/uL (ref 150–400)
RBC: 4.6 MIL/uL (ref 3.87–5.11)
RDW: 12.9 % (ref 11.5–15.5)
WBC: 5.3 10*3/uL (ref 4.0–10.5)
nRBC: 0 % (ref 0.0–0.2)

## 2022-05-11 LAB — VITAMIN D 25 HYDROXY (VIT D DEFICIENCY, FRACTURES): Vit D, 25-Hydroxy: 51.17 ng/mL (ref 30–100)

## 2022-05-11 MED ORDER — GABAPENTIN 100 MG PO CAPS: 300.0000 mg | ORAL_CAPSULE | Freq: Every day | ORAL | 5 refills | Status: DC

## 2022-05-11 MED ORDER — TAMOXIFEN CITRATE 20 MG PO TABS: 20.0000 mg | ORAL_TABLET | Freq: Every day | ORAL | 3 refills | Status: DC

## 2022-05-12 LAB — CANCER ANTIGEN 27.29: CA 27.29: <9 U/mL (ref 0.0–38.6)

## 2022-05-13 ENCOUNTER — Telehealth: Payer: Self-pay | Admitting: Hematology

## 2022-05-13 NOTE — Telephone Encounter (Signed)
Scheduled follow-up appointment per 9/6 los. Patient is aware. 

## 2022-05-21 DIAGNOSIS — J208 Acute bronchitis due to other specified organisms: Secondary | ICD-10-CM | POA: Diagnosis not present

## 2022-05-24 ENCOUNTER — Encounter: Payer: Self-pay | Admitting: Nurse Practitioner

## 2022-05-31 ENCOUNTER — Encounter: Payer: Self-pay | Admitting: Nurse Practitioner

## 2022-06-17 ENCOUNTER — Encounter: Payer: Self-pay | Admitting: Internal Medicine

## 2022-06-30 ENCOUNTER — Ambulatory Visit (AMBULATORY_SURGERY_CENTER): Payer: Self-pay

## 2022-06-30 VITALS — Ht 68.0 in | Wt 117.0 lb

## 2022-06-30 DIAGNOSIS — J0101 Acute recurrent maxillary sinusitis: Secondary | ICD-10-CM | POA: Diagnosis not present

## 2022-06-30 DIAGNOSIS — Z1211 Encounter for screening for malignant neoplasm of colon: Secondary | ICD-10-CM

## 2022-06-30 DIAGNOSIS — R059 Cough, unspecified: Secondary | ICD-10-CM | POA: Diagnosis not present

## 2022-06-30 DIAGNOSIS — B349 Viral infection, unspecified: Secondary | ICD-10-CM | POA: Diagnosis not present

## 2022-06-30 MED ORDER — NA SULFATE-K SULFATE-MG SULF 17.5-3.13-1.6 GM/177ML PO SOLN: 1.0000 | ORAL | 0 refills | Status: AC

## 2022-06-30 NOTE — Progress Notes (Signed)
No egg or soy allergy known to patient  No issues known to pt with past sedation with any surgeries or procedures Patient denies ever being told they had issues or difficulty with intubation  No FH of Malignant Hyperthermia Pt is not on diet pills Pt is not on  home 02  Pt is not on blood thinners  Pt denies issues with constipation  No A fib or A flutter Have any cardiac testing pending--denied Pt instructed to use Singlecare[employer]com or GoodRx for a price reduction on prep   Patient's chart reviewed by Madison Savage CNRA prior to previsit and patient appropriate for the [place].  Previsit completed and red dot placed by patient's name on their procedure day (on provider's schedule).

## 2022-07-15 ENCOUNTER — Encounter: Payer: Self-pay | Admitting: Internal Medicine

## 2022-07-18 DIAGNOSIS — J0191 Acute recurrent sinusitis, unspecified: Secondary | ICD-10-CM | POA: Diagnosis not present

## 2022-07-18 DIAGNOSIS — H6993 Unspecified Eustachian tube disorder, bilateral: Secondary | ICD-10-CM | POA: Diagnosis not present

## 2022-07-22 ENCOUNTER — Encounter: Payer: BC Managed Care – PPO | Admitting: Internal Medicine

## 2022-07-22 DIAGNOSIS — H6593 Unspecified nonsuppurative otitis media, bilateral: Secondary | ICD-10-CM | POA: Diagnosis not present

## 2022-07-22 DIAGNOSIS — J0101 Acute recurrent maxillary sinusitis: Secondary | ICD-10-CM | POA: Diagnosis not present

## 2022-07-27 DIAGNOSIS — G589 Mononeuropathy, unspecified: Secondary | ICD-10-CM | POA: Diagnosis not present

## 2022-09-14 DIAGNOSIS — M26609 Unspecified temporomandibular joint disorder, unspecified side: Secondary | ICD-10-CM | POA: Diagnosis not present

## 2022-09-14 DIAGNOSIS — J3489 Other specified disorders of nose and nasal sinuses: Secondary | ICD-10-CM | POA: Diagnosis not present

## 2022-09-14 DIAGNOSIS — J329 Chronic sinusitis, unspecified: Secondary | ICD-10-CM | POA: Diagnosis not present

## 2022-09-14 DIAGNOSIS — M26629 Arthralgia of temporomandibular joint, unspecified side: Secondary | ICD-10-CM | POA: Diagnosis not present

## 2022-11-09 DIAGNOSIS — D220 Melanocytic nevi of lip: Secondary | ICD-10-CM | POA: Diagnosis not present

## 2022-11-09 DIAGNOSIS — L650 Telogen effluvium: Secondary | ICD-10-CM | POA: Diagnosis not present

## 2022-11-30 ENCOUNTER — Encounter: Payer: Self-pay | Admitting: Nurse Practitioner

## 2022-11-30 ENCOUNTER — Other Ambulatory Visit: Payer: Self-pay | Admitting: Nurse Practitioner

## 2022-11-30 DIAGNOSIS — C50412 Malignant neoplasm of upper-outer quadrant of left female breast: Secondary | ICD-10-CM

## 2022-11-30 DIAGNOSIS — G629 Polyneuropathy, unspecified: Secondary | ICD-10-CM

## 2022-12-01 ENCOUNTER — Encounter: Payer: Self-pay | Admitting: Hematology

## 2022-12-01 ENCOUNTER — Other Ambulatory Visit: Payer: Self-pay | Admitting: Nurse Practitioner

## 2022-12-01 DIAGNOSIS — C50412 Malignant neoplasm of upper-outer quadrant of left female breast: Secondary | ICD-10-CM

## 2022-12-01 DIAGNOSIS — G629 Polyneuropathy, unspecified: Secondary | ICD-10-CM

## 2022-12-01 MED ORDER — GABAPENTIN 100 MG PO CAPS: 300.0000 mg | ORAL_CAPSULE | Freq: Every day | ORAL | 5 refills | Status: DC

## 2023-04-27 DIAGNOSIS — M8580 Other specified disorders of bone density and structure, unspecified site: Secondary | ICD-10-CM | POA: Diagnosis not present

## 2023-04-27 DIAGNOSIS — R7989 Other specified abnormal findings of blood chemistry: Secondary | ICD-10-CM | POA: Diagnosis not present

## 2023-05-04 DIAGNOSIS — C50912 Malignant neoplasm of unspecified site of left female breast: Secondary | ICD-10-CM | POA: Diagnosis not present

## 2023-05-04 DIAGNOSIS — Z1339 Encounter for screening examination for other mental health and behavioral disorders: Secondary | ICD-10-CM | POA: Diagnosis not present

## 2023-05-04 DIAGNOSIS — Z1331 Encounter for screening for depression: Secondary | ICD-10-CM | POA: Diagnosis not present

## 2023-05-04 DIAGNOSIS — Z0000 Encounter for general adult medical examination without abnormal findings: Secondary | ICD-10-CM | POA: Diagnosis not present

## 2023-05-04 DIAGNOSIS — Z23 Encounter for immunization: Secondary | ICD-10-CM | POA: Diagnosis not present

## 2023-05-11 ENCOUNTER — Encounter: Payer: Self-pay | Admitting: Nurse Practitioner

## 2023-05-12 ENCOUNTER — Other Ambulatory Visit: Payer: Self-pay

## 2023-05-12 DIAGNOSIS — C50412 Malignant neoplasm of upper-outer quadrant of left female breast: Secondary | ICD-10-CM

## 2023-05-12 NOTE — Progress Notes (Deleted)
[facility]   [phone] [phone]   Clinic Follow up Note   Patient Care Team: Melida Quitter, MD as PCP - General (Internal Medicine) Almond Lint, MD as Consulting Physician (General Surgery) Malachy Mood, MD as Consulting Physician (Hematology) Dorothy Puffer, MD as Consulting Physician (Radiation Oncology) [name], MD as Consulting Physician (Obstetrics and Gynecology) Axel Filler Larna Daughters, NP as Nurse Practitioner (Hematology and Oncology) Malachy Mood, MD as Consulting Physician (Hematology)  Date of Service:  05/12/2023  CHIEF COMPLAINT: f/u of  BRCA2+ left breast cancer   CURRENT THERAPY:  Tamoxifen, starting 03/2018   ASSESSMENT: *** Madison Savage is a 52 y.o. female with   No problem-specific Assessment & Plan notes found for this encounter.  ***   PLAN:   SUMMARY OF ONCOLOGIC HISTORY: Oncology History Overview Note  Cancer Staging Breast cancer metastasized to axillary lymph node, left (HCC) Staging form: Breast, AJCC 8th Edition - Clinical stage from 03/22/2017: Stage Unknown (cTX, cN1, cM0, GX, ER: Positive, PR: Positive, HER2: Negative) - Signed by Malachy Mood, MD on 03/29/2017 - Pathologic stage from 04/27/2017: Stage IB (pT1c, pN2a, cM0, G2, ER: Positive, PR: Positive, HER2: Negative) - Signed by Malachy Mood, MD on 05/25/2017 \   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 (HCC)  03/21/2017 Mammogram   Diagnostic mammogram bilateral 03/21/17 IMPRESSION: INCOMPLETE - ADDITIONAL IMAGING EVALUATION NEEDED 1. The 0.6 cm oval mass in the right breast at 11 o'clock middle depth is indeterminate. An ultrasound is recommended.  2. The 0.5cm round focal asymmetry in the left breast central to the nipple middle depth is indeterminate. An ultrasound is recommended.  3. Ultrasound of the palpable abnormalities in the axilla and far left lateral breast is recommended.  4. Multiple clusters of pleomorphic  calcifications in the left upper outer breast anterior depth spanning a 2.5 cm area are highly suggestive of malignancy. A stereotactic biopsy is recommended.     03/21/2017 Imaging   US Breast bilateral 03/21/17 IMPRESSION: HIGHLY SUGGESTIVE OF MALIGNANCY 1. Six distinct abnormal left axillary nodes, 2 of which correspond to the palpable lumps felt by the patient. Findings concerning for metastatic adenopathy. Ultrasound guided biopsy of one of these nodes is recommended.  2. Small 5 mm palpable, oval mass in the left breast 3:00 areolar margin is at an intermediate suspicion. Ultrasound guided biopsy is recommended.  3. Isoechoic 8 mm mass in the right breast 10:00 5 cm from the nipple is also an intermediate suspicion for malignancy, and ultrasound guided biopsy is recommended.  4. Stereotactic guided biopsy for the highly suspicious calcifications in the left breast also recommended as detailed in the mammography report.     03/22/2017 Initial Biopsy   Diagnosis 03/22/17 Lymph node, needle/core biopsy, left axillary node -METASTATIC CARCINOMA, SEE COMMENT Microscopic Comment   03/22/2017 Receptors her2   Lymph node biopsy ER 95% positive, strong staining, PR 5% positive, weak staining, HER-2 negative, with HER2/CEP17 ratio 1.23 and copy #3.95.   03/27/2017 Pathology Results   Diagnosis 03/27/17 1. Breast, left, needle core biopsy -FIBROADENOMATOID NODULE WITH CALCIFICATIONS 2.Breast, left, needle core biopsy -DUCTAL CARCINOMA IN SITU, HIGH GRADE -SEE COMMENT  3. Breast, right, needle, core biopsy -FIBROADENOMA -NO MALIGNANCY IDENTIFIED    03/29/2017 Initial Diagnosis   Breast cancer metastasized to axillary lymph node, left (HCC)   04/07/2017 Imaging   CT CAP 04/07/17 IMPRESSION: 1. Several prominent left axillary lymph nodes which may correspond to biopsy proven axillary lymph node  metastasis. 2. No thoracic adenopathy identified or evidence of distant metastatic disease.    04/07/2017 Imaging   Bone whole body scan 04/07/17 IMPRESSION: Negative for evidence of osseous metastatic disease.   04/17/2017 Genetic Testing   BRCA2 c.[id number] (p.Glu260Serfs*15) pathogenic mutation identified on the 9 gene STAT panel.  The STAT Breast cancer panel offered by [facility] includes sequencing and rearrangement analysis for the following 9 genes:  ATM, BRCA1, BRCA2, CDH1, CHEK2, PALB2, PTEN, STK11 and TP53.   The report date is April 17, 2017.  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  The Hereditary Gene Panel offered by [facility] includes sequencing and/or deletion duplication testing of the following 46 genes: APC, ATM, AXIN2, BARD1, BMPR1A, BRCA1, BRCA2, BRIP1, CDH1, CDKN2A (p14ARF), CDKN2A (p16INK4a), CHEK2, CTNNA1, DICER1, EPCAM (Deletion/duplication testing only), GREM1 (promoter region deletion/duplication testing only), KIT, MEN1, MLH1, MSH2, MSH3, MSH6, MUTYH, NBN, NF1, NHTL1, PALB2, PDGFRA, PMS2, POLD1, POLE, PTEN, RAD50, RAD51C, RAD51D, SDHB, SDHC, SDHD, SMAD4, SMARCA4. STK11, TP53, TSC1, TSC2, and VHL.  The following genes were evaluated for sequence changes only: SDHA and HOXB13 c.251G>A variant only.  The report date is April 29, 2017.    04/27/2017 Surgery   Surgey 04/27/17 BILATERAL SKIN SPARING MASTECTOMY and LEFT AXILLARY LYMPH NODE and BILATERAL BREAST RECONSTRUCTION WITH PLACEMENT OF TISSUE EXPANDER AND FLEX HD (ACELLULAR HYDRATED DERMIS) Bilateral BY Dr. Donell Beers and Dr. Ulice Bold    04/27/2017 Pathology Results    Diagnosis  04/27/17 1. Breast, simple mastectomy, Right - FIBROCYSTIC CHANGES WITH ADENOSIS. - USUAL DUCTAL HYPERPLASIA. - HEALING BIOPSY SITE. - THERE IS NO EVIDENCE OF MALIGNANCY. 2. Breast, simple mastectomy, Left - INVASIVE DUCTAL CARCINOMA, GRADE II/III, SPANNING 1.2 CM. - DUCTAL CARCINOMA IN SITU WITH CALCIFICATIONS, HIGH GRADE. -  INVASIVE DUCTAL CARCINOMA IS FOCALLY PRESENT AT THE ANTERIOR MARGIN. - DUCTAL CARCINOMA IN SITU IS BROADLY 0.1 CM TO THE POSTERIOR MARGIN. - SEE ONCOLOGY TABLE BELOW. 3. Lymph nodes, regional resection, Left axillary - METASTATIC CARCINOMA IN 8 OF 21 LYMPH NODES (8/21). - SEE COMMENT   06/09/2017 - 11/16/2017 Chemotherapy   Adriamycin and Cytoxan every 2 weeks, for 4 cycles 06/09/17-07/21/17, followed by weekly Abraxane and carboplatin for 12 weeks starting 08/11/17. Held Carbo with cycle 2 due to poor toleration. Changed to single agent Abraxane for Cycle 3 on 09/08/17. Completed on 11/16/17 with reduced Abraxane due to body aches.    12/06/2017 Surgery   BREAST CAPSULECTOMY WITH IMPLANT EXCHANGE and PLACEMENT OF BREAST IMPLANTS and REMOVAL PORT-A-CATH  by Dr. Ulice Bold on 12/06/17   12/18/2017 Survivorship   Survivorship clinic with NP Mardella Layman    01/03/2018 - 02/19/2018 Radiation Therapy   Radiation therapy 01/03/18 - 02/19/18.   03/2018 -  Anti-estrogen oral therapy   Adjuvant tamoxifen started in July 2019, switched to letrozole started 09/2018 after BSO but due to allergy reaction with hives, ithcing and swelling she d/c and restarted Tamoxifen.     08/31/2018 Surgery   08/31/2018 BSO With Dr. Andrey Farmer  Diagnosis 1. Ovary and fallopian tube, right RIGHT OVARY AND RIGHT FALLOPIAN TUBE: - UNREMARKABLE. - NO ENDOMETRIOSIS OR MALIGNANCY. 2. Ovary and fallopian tube, left LEFT OVARY AND LEFT FALLOPIAN TUBE: - UNREMARKABLE. - NO ENDOMETRIOSIS OR MALIGNANCY.      INTERVAL HISTORY: *** Madison Savage is here for a follow up of  BRCA2+ left breast cancer. She was last seen by NP Lacie on 05/11/2022. She presents to the clinic  All other systems were reviewed with the patient and are negative.  MEDICAL HISTORY:  Past Medical History:  Diagnosis Date   Anemia    Anxiety    BRCA2 positive    Chronic ITP (idiopathic thrombocytopenia) (HCC)    Family history of breast cancer    Family  history of ovarian cancer    Headache    last migraine...9/24   History of cancer chemotherapy    left breast 06-09-2017  to 11-16-2017   History of cardiac murmur as a child    infant   History of external beam radiation therapy    left breast   01-03-2018  to 02-19-2018   MTHFR mutation    PONV (postoperative nausea and vomiting)    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 [facility]) oncologist-- dr Mosetta Putt   dx 03-22-2017--- 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/  04-27-2018  s/p  bilateral mastectomy with left sln dissection's with reconstruction,  completed chemothearpy 11-16-2017,  completed radiation 02-19-2018,  started tamoxifen 07/ 2019   Wears contact lenses     SURGICAL HISTORY: Past Surgical History:  Procedure Laterality Date   AXILLARY LYMPH NODE DISSECTION Left 04/27/2017   Procedure: AXILLARY LYMPH NODE DISSECTION;  Surgeon: Almond Lint, MD;  Location: [facility];  Service: General;  Laterality: Left;   BILATERAL TOTAL MASTECTOMY WITH AXILLARY LYMPH NODE DISSECTION Bilateral 04/27/2017   Procedure: SKIN SPARING MASTECTOMY;  Surgeon: Almond Lint, MD;  Location: [facility];  Service: General;  Laterality: Bilateral;   BREAST CAPSULECTOMY WITH IMPLANT EXCHANGE Bilateral 12/06/2017   Procedure: BREAST CAPSULECTOMY WITH IMPLANT EXCHANGE;  Surgeon: Peggye Form, DO;  Location: [facility];  Service: Plastics;  Laterality: Bilateral;   BREAST RECONSTRUCTION WITH PLACEMENT OF TISSUE EXPANDER AND FLEX HD (ACELLULAR HYDRATED DERMIS) Bilateral 04/27/2017   Procedure: BILATERAL BREAST RECONSTRUCTION WITH PLACEMENT OF TISSUE EXPANDER AND FLEX HD (ACELLULAR HYDRATED DERMIS);  Surgeon: Peggye Form, DO;  Location: [facility];  Service: Plastics;  Laterality: Bilateral;   MASTECTOMY Bilateral    PLACEMENT OF BREAST IMPLANTS Bilateral 12/06/2017   Procedure: PLACEMENT OF  BREAST IMPLANTS;  Surgeon: Peggye Form, DO;  Location: [facility];  Service: Plastics;  Laterality: Bilateral;   PORT-A-CATH REMOVAL Right 12/06/2017   Procedure: REMOVAL PORT-A-CATH;  Surgeon: Peggye Form, DO;  Location:  [facility]ERY [facility];  Service: Plastics;  Laterality: Right;   PORTACATH PLACEMENT Right 06/01/2017   Procedure: INSERTION PORT-A-CATH RIGHT SUBCLAVIAN;  Surgeon: Almond Lint, MD;  Location: [facility];  Service: General;  Laterality: Right;   ROBOTIC ASSISTED SALPINGO OOPHERECTOMY Bilateral 08/31/2018   Procedure: XI ROBOTIC ASSISTED BILATERAL  SALPINGO OOPHORECTOMY;  Surgeon: Adolphus Birchwood, MD;  Location: [facility] [place];  Service: Gynecology;  Laterality: Bilateral;   WISDOM TOOTH EXTRACTION      I have reviewed the social history and family history with the patient and they are unchanged from previous note.  ALLERGIES:  is allergic to clindamycin, contrast media [iodinated contrast media], penicillins, and codeine.  MEDICATIONS:  Current Outpatient Medications  Medication Sig Dispense Refill   ascorbic acid (VITAMIN C) 500 MG tablet Take by mouth.     b complex vitamins capsule Take 1 capsule by mouth daily.     Cholecalciferol (VITAMIN D-3 PO) Take 1 capsule by mouth daily.     gabapentin (NEURONTIN) 100 MG capsule Take 3 capsules (300 mg total) by  mouth at bedtime. 90 capsule 5   MAGNESIUM PO Take 1 tablet by mouth daily. Take Muscle cramps     Misc Natural Products (CALCIUM PLUS ADVANCED PO) Take 1 tablet by mouth daily.     Na Sulfate-K Sulfate-Mg Sulf 17.5-3.13-1.6 GM/177ML SOLN Take 1 kit by mouth as directed. May use generic Suprep, no prior authorization. Take as directed. 354 mL 0   OVER THE COUNTER MEDICATION Take 1 capsule by mouth daily. Biotin/MSM     tamoxifen (NOLVADEX) 20 MG tablet Take 1 tablet (20 mg total) by mouth daily. 90 tablet 3   No current facility-administered medications for this visit.    Facility-Administered Medications Ordered in Other Visits  Medication Dose Route Frequency Provider Last Rate Last Admin   sodium chloride flush (NS) 0.9 % injection 10 mL  10 mL Intravenous PRN Malachy Mood, MD   10 mL at 07/21/17 1323    PHYSICAL EXAMINATION: ECOG PERFORMANCE STATUS: {CHL ONC ECOG [phone]}  There were no vitals filed for this visit. Wt Readings from Last 3 Encounters:  06/30/22 117 lb (53.1 kg)  05/11/22 119 lb 12.8 oz (54.3 kg)  12/28/21 123 lb 7.3 oz (56 kg)    {Only keep what was examined. If exam not performed, can use .CEXAM } GENERAL:alert, no distress and comfortable SKIN: skin color, texture, turgor are normal, no rashes or significant lesions EYES: normal, Conjunctiva are pink and non-injected, sclera clear {OROPHARYNX:no exudate, no erythema and lips, buccal mucosa, and tongue normal}  NECK: supple, thyroid normal size, non-tender, without nodularity LYMPH:  no palpable lymphadenopathy in the cervical, axillary {or inguinal} LUNGS: clear to auscultation and percussion with normal breathing effort HEART: regular rate & rhythm and no murmurs and no lower extremity edema ABDOMEN:abdomen soft, non-tender and normal bowel sounds Musculoskeletal:no cyanosis of digits and no clubbing  NEURO: alert & oriented x 3 with fluent speech, no focal motor/sensory deficits  LABORATORY DATA:  I have reviewed the data as listed    Latest Ref Rng & Units 05/11/2022    9:16 AM 12/28/2021   12:34 PM 12/24/2021    3:05 AM  CBC  WBC 4.0 - 10.5 K/uL 5.3  6.1  6.6   Hemoglobin 12.0 - 15.0 g/dL 78.2  95.6  21.3   Hematocrit 36.0 - 46.0 % 41.9  42.0  37.0   Platelets 150 - 400 K/uL 188  218  150         Latest Ref Rng & Units 05/11/2022    9:16 AM 12/28/2021   12:34 PM 12/24/2021    3:05 AM  CMP  Glucose 70 - 99 mg/dL 086  95  578   BUN 6 - 20 mg/dL 18  15  8    Creatinine 0.44 - 1.00 mg/dL 4.69  6.29  5.28   Sodium 135 - 145 mmol/L 139  139  140   Potassium 3.5 -  5.1 mmol/L 4.0  4.2  3.9   Chloride 98 - 111 mmol/L 107  107  112   CO2 22 - 32 mmol/L 28  25  21    Calcium 8.9 - 10.3 mg/dL 9.3  8.7  8.4   Total Protein 6.5 - 8.1 g/dL 6.6   5.6   Total Bilirubin 0.3 - 1.2 mg/dL 0.4   0.7   Alkaline Phos 38 - 126 U/L 54   42   AST 15 - 41 U/L 15   13   ALT 0 - 44 U/L 11   9  RADIOGRAPHIC STUDIES: I have personally reviewed the radiological images as listed and agreed with the findings in the report. No results found.    No orders of the defined types were placed in this encounter.  All questions were answered. The patient knows to call the clinic with any problems, questions or concerns. No barriers to learning was detected. The total time spent in the appointment was {CHL ONC TIME VISIT - [id number]}.     Salome Holmes, CMA 05/12/2023   I, Monica Martinez, CMA, am acting as scribe for Malachy Mood, MD.   {Add scribe attestation statement}

## 2023-05-13 ENCOUNTER — Encounter: Payer: Self-pay | Admitting: Hematology

## 2023-05-14 DIAGNOSIS — M8580 Other specified disorders of bone density and structure, unspecified site: Secondary | ICD-10-CM | POA: Insufficient documentation

## 2023-05-14 NOTE — Assessment & Plan Note (Deleted)
-  DEXA on 02/12/21 showed osteopenia (T-score -1.6 at left femur neck). Will monitor every 2 years. -tamoxifen will help strengthen her bones.

## 2023-05-14 NOTE — Assessment & Plan Note (Deleted)
pT1cN2aM0, stage IB, ER+/PR +/HER2 -, G2, BRCA2(+) -Diagnosed in 03/2017. BRCA 2 +. Treated with b/l mastectomy, adjuvant chemo and radiation.  -S/p BSO for BRCA2(+) in 08/2018. She changed from tamoxifen to AI, but had allergic reaction and switched back to tamoxifen, tolerating well, plan for 10 years -she is clinically doing well from a breast cancer standpoint. Labs reviewed, overall stable. Physical exam was unremarkable. There is no clinical concern for recurrence. -Continue surveillance

## 2023-05-14 NOTE — Assessment & Plan Note (Deleted)
-  S/p bilateral mastectomy and BSO (09/24/18 per Dr. Andrey Farmer) -Her oldest son [name] age 50 underwent testing and is BRCA positive, he has met with a genetic counselor

## 2023-05-15 ENCOUNTER — Inpatient Hospital Stay: Payer: BC Managed Care – PPO | Admitting: Hematology

## 2023-05-15 ENCOUNTER — Inpatient Hospital Stay: Payer: BC Managed Care – PPO

## 2023-05-15 ENCOUNTER — Telehealth: Payer: Self-pay | Admitting: Hematology

## 2023-05-15 DIAGNOSIS — M8580 Other specified disorders of bone density and structure, unspecified site: Secondary | ICD-10-CM

## 2023-05-15 DIAGNOSIS — C50412 Malignant neoplasm of upper-outer quadrant of left female breast: Secondary | ICD-10-CM

## 2023-05-15 DIAGNOSIS — Z1509 Genetic susceptibility to other malignant neoplasm: Secondary | ICD-10-CM

## 2023-06-06 ENCOUNTER — Other Ambulatory Visit: Payer: Self-pay | Admitting: Nurse Practitioner

## 2023-06-06 DIAGNOSIS — G629 Polyneuropathy, unspecified: Secondary | ICD-10-CM

## 2023-06-06 DIAGNOSIS — C50412 Malignant neoplasm of upper-outer quadrant of left female breast: Secondary | ICD-10-CM

## 2023-06-07 ENCOUNTER — Encounter: Payer: Self-pay | Admitting: Hematology

## 2023-06-26 NOTE — Progress Notes (Unsigned)
Patient Care Team: Melida Quitter, MD as PCP - General (Internal Medicine) Almond Lint, MD as Consulting Physician (General Surgery) Malachy Mood, MD as Consulting Physician (Hematology) Dorothy Puffer, MD as Consulting Physician (Radiation Oncology) [name], MD as Consulting Physician (Obstetrics and Gynecology) Axel Filler Larna Daughters, Madison Savage as Nurse Practitioner (Hematology and Oncology) Malachy Mood, MD as Consulting Physician (Hematology)  Clinic Day:  06/27/2023  Referring physician: Melida Quitter, MD  ASSESSMENT & PLAN:   Assessment & Plan: BRCA2 gene mutation positive -S/p bilateral mastectomy and BSO (09/24/2018) per Dr. Andrey Farmer -Her oldest son Madison Savage underwent genetic testing at age 90, and is BRCA positive. He has met with a Dentist.  -She needs to contact GI for screening colonoscopy.      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 BRCA 2 +. Treated with b/l mastectomy, adjuvant chemo and radiation.  -S/p BSO for BRCA2(+) she changed from tamoxifen to AI, but had allergic reaction and switched back to tamoxifen, tolerating well, goal 7-10 years -Tolerating tamoxifen without significant side effects.   -She is over 5 years from initial diagnosis, the recurrence risk has decreased.  Continue surveillance and tamoxifen.  She would like to follow-up annually.  She agrees to stagger our visits with OB/GYN with breast exam every 6 months, -No role for mammograms given bilateral mastectomy -Follow-up in 1 year, or sooner if needed   Plan:  Labs reviewed  -CBC showing WBC 5.1; Hgb 13.5; Hct 39.1; Plt 168; Anc 3.0 -CMP - K 4.5; glucose 98; BUN 19; Creatinine 0.93; eGFR >60; Ca 9.2; LFTs normal.   Yearly follow up with OB/GYN as scheduled.  Continue tamoxifen daily Labs and follow  up in one year.   The patient understands the plans discussed today and is in agreement with them.  She knows to contact our office if she develops concerns prior to her next appointment.  I provided 25 minutes of face-to-face time during this encounter and > 50% was spent counseling as documented under my assessment and plan.    Madison Savage, Madison Savage  [place] [facility] AT [facility] [address] [place] [address] Dept: [phone] Dept Fax: [phone]   No orders of the defined types were placed in this encounter.     CHIEF COMPLAINT:  CC:  BRCA2+ left breast cancer   Current Treatment:  tamoxifen starting 03/2018  INTERVAL HISTORY:  Madison Savage is here today for repeat clinical assessment. She was last seen by Clayborn Heron, Madison Savage on 05/11/2022. She denies fevers or chills. She denies pain. Her appetite is good. Her weight has increased 7 pounds over last 1 year . She reports doing well in general. Had COVID 19 about 2 weeks ago. Has recovered completely. Reports this is 4th time she has had COVID 19. Has had intermittent hair loss as a result of this infection. Was able to go to music concert over this past weekend. Reports increased frequency of anxiety and panic type attacks. Has taken hydroxyzine for this which does help. Tries not to take unless absolutely needed. States that she has tried to eliminate pork and red meat from her diet. Feels like these foods cause rash and hives.   I have reviewed the past medical history, past surgical history, social history and family history with the patient and  they are unchanged from previous note.  ALLERGIES:  is allergic to clindamycin, contrast media [iodinated contrast media], penicillins, and codeine.  MEDICATIONS:  Current Outpatient Medications  Medication Sig Dispense Refill   ascorbic acid (VITAMIN C) 500 MG tablet Take by mouth.     b complex vitamins capsule Take 1 capsule by mouth  daily.     Cholecalciferol (VITAMIN D-3 PO) Take 1 capsule by mouth daily.     gabapentin (NEURONTIN) 100 MG capsule TAKE 3 CAPSULES BY MOUTH AT BEDTIME. 90 capsule 5   hydrOXYzine (ATARAX) 25 MG tablet Take 25 mg by mouth every 6 (six) hours as needed.     MAGNESIUM PO Take 1 tablet by mouth daily. Take Muscle cramps     Misc Natural Products (CALCIUM PLUS ADVANCED PO) Take 1 tablet by mouth daily.     Na Sulfate-K Sulfate-Mg Sulf 17.5-3.13-1.6 GM/177ML SOLN Take 1 kit by mouth as directed. May use generic Suprep, no prior authorization. Take as directed. 354 mL 0   OVER THE COUNTER MEDICATION Take 1 capsule by mouth daily. Biotin/MSM     tamoxifen (NOLVADEX) 20 MG tablet Take 1 tablet (20 mg total) by mouth daily. 90 tablet 3   triamcinolone cream (KENALOG) 0.1 % Apply 1 Application topically 3 (three) times daily.     No current facility-administered medications for this visit.   Facility-Administered Medications Ordered in Other Visits  Medication Dose Route Frequency Provider Last Rate Last Admin   sodium chloride flush (NS) 0.9 % injection 10 mL  10 mL Intravenous PRN Malachy Mood, MD   10 mL at 07/21/17 1323    HISTORY OF PRESENT ILLNESS:   Oncology History Overview Note  Cancer Staging Breast cancer metastasized to axillary lymph node, left (HCC) Staging form: Breast, AJCC 8th Edition - Clinical stage from 03/22/2017: Stage Unknown (cTX, cN1, cM0, GX, ER: Positive, PR: Positive, HER2: Negative) - Signed by Malachy Mood, MD on 03/29/2017 - Pathologic stage from 04/27/2017: Stage IB (pT1c, pN2a, cM0, G2, ER: Positive, PR: Positive, HER2: Negative) - Signed by Malachy Mood, MD on 05/25/2017 \   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 (HCC)  03/21/2017 Mammogram   Diagnostic mammogram bilateral 03/21/17 IMPRESSION: INCOMPLETE - ADDITIONAL IMAGING EVALUATION NEEDED 1. The 0.6 cm oval mass in the right breast at 11 o'clock middle depth is indeterminate. An ultrasound is  recommended.  2. The 0.5cm round focal asymmetry in the left breast central to the nipple middle depth is indeterminate. An ultrasound is recommended.  3. Ultrasound of the palpable abnormalities in the axilla and far left lateral breast is recommended.  4. Multiple clusters of pleomorphic calcifications in the left upper outer breast anterior depth spanning a 2.5 cm area are highly suggestive of malignancy. A stereotactic biopsy is recommended.     03/21/2017 Imaging   US Breast bilateral 03/21/17 IMPRESSION: HIGHLY SUGGESTIVE OF MALIGNANCY 1. Six distinct abnormal left axillary nodes, 2 of which correspond to the palpable lumps felt by the patient. Findings concerning for metastatic adenopathy. Ultrasound guided biopsy of one of these nodes is recommended.  2. Small 5 mm palpable, oval mass in the left breast 3:00 areolar margin is at an intermediate suspicion. Ultrasound guided biopsy is recommended.  3. Isoechoic 8 mm mass in the right breast 10:00 5 cm from the nipple is also an intermediate suspicion for malignancy, and ultrasound guided biopsy is recommended.  4. Stereotactic guided biopsy for the highly suspicious calcifications in the left breast also  recommended as detailed in the mammography report.     03/22/2017 Initial Biopsy   Diagnosis 03/22/17 Lymph node, needle/core biopsy, left axillary node -METASTATIC CARCINOMA, SEE COMMENT Microscopic Comment   03/22/2017 Receptors her2   Lymph node biopsy ER 95% positive, strong staining, PR 5% positive, weak staining, HER-2 negative, with HER2/CEP17 ratio 1.23 and copy #3.95.   03/27/2017 Pathology Results   Diagnosis 03/27/17 1. Breast, left, needle core biopsy -FIBROADENOMATOID NODULE WITH CALCIFICATIONS 2.Breast, left, needle core biopsy -DUCTAL CARCINOMA IN SITU, HIGH GRADE -SEE COMMENT  3. Breast, right, needle, core biopsy -FIBROADENOMA -NO MALIGNANCY IDENTIFIED    03/29/2017 Initial Diagnosis   Breast cancer metastasized to  axillary lymph node, left (HCC)   04/07/2017 Imaging   CT CAP 04/07/17 IMPRESSION: 1. Several prominent left axillary lymph nodes which may correspond to biopsy proven axillary lymph node metastasis. 2. No thoracic adenopathy identified or evidence of distant metastatic disease.   04/07/2017 Imaging   Bone whole body scan 04/07/17 IMPRESSION: Negative for evidence of osseous metastatic disease.   04/17/2017 Genetic Testing   BRCA2 c.[id number] (p.Glu260Serfs*15) pathogenic mutation identified on the 9 gene STAT panel.  The STAT Breast cancer panel offered by [facility] includes sequencing and rearrangement analysis for the following 9 genes:  ATM, BRCA1, BRCA2, CDH1, CHEK2, PALB2, PTEN, STK11 and TP53.   The report date is April 17, 2017.  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  The Hereditary Gene Panel offered by [facility] includes sequencing and/or deletion duplication testing of the following 46 genes: APC, ATM, AXIN2, BARD1, BMPR1A, BRCA1, BRCA2, BRIP1, CDH1, CDKN2A (p14ARF), CDKN2A (p16INK4a), CHEK2, CTNNA1, DICER1, EPCAM (Deletion/duplication testing only), GREM1 (promoter region deletion/duplication testing only), KIT, MEN1, MLH1, MSH2, MSH3, MSH6, MUTYH, NBN, NF1, NHTL1, PALB2, PDGFRA, PMS2, POLD1, POLE, PTEN, RAD50, RAD51C, RAD51D, SDHB, SDHC, SDHD, SMAD4, SMARCA4. STK11, TP53, TSC1, TSC2, and VHL.  The following genes were evaluated for sequence changes only: SDHA and HOXB13 c.251G>A variant only.  The report date is April 29, 2017.    04/27/2017 Surgery   Surgey 04/27/17 BILATERAL SKIN SPARING MASTECTOMY and LEFT AXILLARY LYMPH NODE and BILATERAL BREAST RECONSTRUCTION WITH PLACEMENT OF TISSUE EXPANDER AND FLEX HD (ACELLULAR HYDRATED DERMIS) Bilateral BY Dr. Donell Beers and Dr. Ulice Bold    04/27/2017 Pathology Results    Diagnosis  04/27/17 1. Breast, simple mastectomy, Right -  FIBROCYSTIC CHANGES WITH ADENOSIS. - USUAL DUCTAL HYPERPLASIA. - HEALING BIOPSY SITE. - THERE IS NO EVIDENCE OF MALIGNANCY. 2. Breast, simple mastectomy, Left - INVASIVE DUCTAL CARCINOMA, GRADE II/III, SPANNING 1.2 CM. - DUCTAL CARCINOMA IN SITU WITH CALCIFICATIONS, HIGH GRADE. - INVASIVE DUCTAL CARCINOMA IS FOCALLY PRESENT AT THE ANTERIOR MARGIN. - DUCTAL CARCINOMA IN SITU IS BROADLY 0.1 CM TO THE POSTERIOR MARGIN. - SEE ONCOLOGY TABLE BELOW. 3. Lymph nodes, regional resection, Left axillary - METASTATIC CARCINOMA IN 8 OF 21 LYMPH NODES (8/21). - SEE COMMENT   06/09/2017 - 11/16/2017 Chemotherapy   Adriamycin and Cytoxan every 2 weeks, for 4 cycles 06/09/17-07/21/17, followed by weekly Abraxane and carboplatin for 12 weeks starting 08/11/17. Held Carbo with cycle 2 due to poor toleration. Changed to single agent Abraxane for Cycle 3 on 09/08/17. Completed on 11/16/17 with reduced Abraxane due to body aches.    12/06/2017 Surgery   BREAST CAPSULECTOMY WITH IMPLANT EXCHANGE and PLACEMENT OF BREAST IMPLANTS and REMOVAL PORT-A-CATH  by Dr. Ulice Bold on 12/06/17   12/18/2017 Survivorship   Survivorship clinic with Madison Savage Mardella Layman  01/03/2018 - 02/19/2018 Radiation Therapy   Radiation therapy 01/03/18 - 02/19/18.   03/2018 -  Anti-estrogen oral therapy   Adjuvant tamoxifen started in July 2019, switched to letrozole started 09/2018 after BSO but due to allergy reaction with hives, ithcing and swelling she d/c and restarted Tamoxifen.     08/31/2018 Surgery   08/31/2018 BSO With Dr. Andrey Farmer  Diagnosis 1. Ovary and fallopian tube, right RIGHT OVARY AND RIGHT FALLOPIAN TUBE: - UNREMARKABLE. - NO ENDOMETRIOSIS OR MALIGNANCY. 2. Ovary and fallopian tube, left LEFT OVARY AND LEFT FALLOPIAN TUBE: - UNREMARKABLE. - NO ENDOMETRIOSIS OR MALIGNANCY.       REVIEW OF SYSTEMS:   Constitutional: Denies fevers, chills or abnormal weight loss Eyes: Denies blurriness of vision Ears, nose, mouth, throat, and  face: Denies mucositis or sore throat Respiratory: Denies cough, dyspnea or wheezes Cardiovascular: Denies palpitation, chest discomfort or lower extremity swelling Gastrointestinal:  Denies nausea, heartburn or change in bowel habits Skin: Denies abnormal skin rashes Lymphatics: Denies new lymphadenopathy or easy bruising Neurological:Denies numbness, tingling or new weaknesses Behavioral/Psych: Mood is stable, no new changes  All other systems were reviewed with the patient and are negative.   VITALS:   Today's Vitals   06/27/23 1118 06/27/23 1122  BP: 94/72   Pulse: 73   Resp: 18   Temp: 98.4 F (36.9 C)   TempSrc: Oral   SpO2: 99%   Weight: 124 lb 12.8 oz (56.6 kg)   Height: 5\' 8"  (1.727 m)   PainSc:  0-No pain   Body mass index is 18.98 kg/m.   Wt Readings from Last 3 Encounters:  06/27/23 124 lb 12.8 oz (56.6 kg)  06/30/22 117 lb (53.1 kg)  05/11/22 119 lb 12.8 oz (54.3 kg)    Body mass index is 18.98 kg/m.  Performance status (ECOG): 0 - Asymptomatic  PHYSICAL EXAM:   GENERAL:alert, no distress and comfortable SKIN: skin color, texture, turgor are normal, no rashes or significant lesions EYES: normal, Conjunctiva are pink and non-injected, sclera clear OROPHARYNX:no exudate, no erythema and lips, buccal mucosa, and tongue normal  NECK: supple, thyroid normal size, non-tender, without nodularity LYMPH:  no palpable lymphadenopathy in the cervical, axillary or inguinal LUNGS: clear to auscultation and percussion with normal breathing effort HEART: regular rate & rhythm and no murmurs and no lower extremity edema ABDOMEN:abdomen soft, non-tender and normal bowel sounds Musculoskeletal:no cyanosis of digits and no clubbing  NEURO: alert & oriented x 3 with fluent speech, no focal motor/sensory deficits BREASt: Bilateral mastectomy and reconstruction, nipple surgically absent.  Incisions completely healed.  No palpable mass or nodularity along the chest wall,  scars, or either axilla palpable during today's visit.   LABORATORY DATA:  I have reviewed the data as listed    Component Value Date/Time   NA 140 06/27/2023 1048   NA 139 09/08/2017 1024   K 4.5 06/27/2023 1048   K 4.0 09/08/2017 1024   CL 107 06/27/2023 1048   CO2 29 06/27/2023 1048   CO2 26 09/08/2017 1024   GLUCOSE 98 06/27/2023 1048   GLUCOSE 99 09/08/2017 1024   BUN 19 06/27/2023 1048   BUN 13.6 09/08/2017 1024   CREATININE 0.93 06/27/2023 1048   CREATININE 0.8 09/08/2017 1024   CALCIUM 9.2 06/27/2023 1048   CALCIUM 9.1 09/08/2017 1024   PROT 6.3 (L) 06/27/2023 1048   PROT 6.3 (L) 09/08/2017 1024   ALBUMIN 4.0 06/27/2023 1048   ALBUMIN 3.9 09/08/2017 1024   AST 14 (L) 06/27/2023  1048   AST 14 09/08/2017 1024   ALT 12 06/27/2023 1048   ALT 14 09/08/2017 1024   ALKPHOS 46 06/27/2023 1048   ALKPHOS 75 09/08/2017 1024   BILITOT 0.3 06/27/2023 1048   BILITOT 0.30 09/08/2017 1024   GFRNONAA >60 06/27/2023 1048   GFRAA >60 04/27/2020 1341   GFRAA >60 11/28/2019 1051     Lab Results  Component Value Date   WBC 5.1 06/27/2023   NEUTROABS 3.0 06/27/2023   HGB 13.5 06/27/2023   HCT 39.1 06/27/2023   MCV 92.2 06/27/2023   PLT 168 06/27/2023

## 2023-06-26 NOTE — Assessment & Plan Note (Addendum)
-  S/p bilateral mastectomy and BSO (09/24/2018) per Dr. Andrey Farmer -Her oldest son Madison Savage underwent genetic testing at age 50, and is BRCA positive. He has met with a Dentist.  -She needs to contact GI for screening colonoscopy.

## 2023-06-26 NOTE — Assessment & Plan Note (Signed)
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 BRCA 2 +. Treated with b/l mastectomy, adjuvant chemo and radiation.  -S/p BSO for BRCA2(+) she changed from tamoxifen to AI, but had allergic reaction and switched back to tamoxifen, tolerating well, goal 7-10 years -Tolerating tamoxifen without significant side effects.   -She is over 5 years from initial diagnosis, the recurrence risk has decreased.  Continue surveillance and tamoxifen.  She would like to follow-up annually.  She agrees to stagger our visits with OB/GYN with breast exam every 6 months, -No role for mammograms given bilateral mastectomy -Follow-up in 1 year, or sooner if needed

## 2023-06-27 ENCOUNTER — Inpatient Hospital Stay: Payer: BC Managed Care – PPO | Admitting: Nurse Practitioner

## 2023-06-27 ENCOUNTER — Encounter: Payer: Self-pay | Admitting: Nurse Practitioner

## 2023-06-27 ENCOUNTER — Inpatient Hospital Stay: Payer: BC Managed Care – PPO | Attending: Hematology

## 2023-06-27 ENCOUNTER — Encounter: Payer: Self-pay | Admitting: Hematology

## 2023-06-27 VITALS — BP 94/72 | HR 73 | Temp 98.4°F | Resp 18 | Ht 68.0 in | Wt 124.8 lb

## 2023-06-27 DIAGNOSIS — C773 Secondary and unspecified malignant neoplasm of axilla and upper limb lymph nodes: Secondary | ICD-10-CM | POA: Diagnosis not present

## 2023-06-27 DIAGNOSIS — Z79810 Long term (current) use of selective estrogen receptor modulators (SERMs): Secondary | ICD-10-CM | POA: Insufficient documentation

## 2023-06-27 DIAGNOSIS — Z1321 Encounter for screening for nutritional disorder: Secondary | ICD-10-CM

## 2023-06-27 DIAGNOSIS — Z170 Estrogen receptor positive status [ER+]: Secondary | ICD-10-CM | POA: Insufficient documentation

## 2023-06-27 DIAGNOSIS — C50412 Malignant neoplasm of upper-outer quadrant of left female breast: Secondary | ICD-10-CM | POA: Diagnosis not present

## 2023-06-27 DIAGNOSIS — Z1509 Genetic susceptibility to other malignant neoplasm: Secondary | ICD-10-CM | POA: Diagnosis not present

## 2023-06-27 DIAGNOSIS — Z1501 Genetic susceptibility to malignant neoplasm of breast: Secondary | ICD-10-CM

## 2023-06-27 DIAGNOSIS — Z9013 Acquired absence of bilateral breasts and nipples: Secondary | ICD-10-CM | POA: Diagnosis not present

## 2023-06-27 LAB — CBC WITH DIFFERENTIAL (CANCER CENTER ONLY)
Abs Immature Granulocytes: 0.02 10*3/uL (ref 0.00–0.07)
Basophils Absolute: 0 10*3/uL (ref 0.0–0.1)
Basophils Relative: 1 %
Eosinophils Absolute: 0.2 10*3/uL (ref 0.0–0.5)
Eosinophils Relative: 3 %
HCT: 39.1 % (ref 36.0–46.0)
Hemoglobin: 13.5 g/dL (ref 12.0–15.0)
Immature Granulocytes: 0 %
Lymphocytes Relative: 27 %
Lymphs Abs: 1.4 10*3/uL (ref 0.7–4.0)
MCH: 31.8 pg (ref 26.0–34.0)
MCHC: 34.5 g/dL (ref 30.0–36.0)
MCV: 92.2 fL (ref 80.0–100.0)
Monocytes Absolute: 0.5 10*3/uL (ref 0.1–1.0)
Monocytes Relative: 10 %
Neutro Abs: 3 10*3/uL (ref 1.7–7.7)
Neutrophils Relative %: 59 %
Platelet Count: 168 10*3/uL (ref 150–400)
RBC: 4.24 MIL/uL (ref 3.87–5.11)
RDW: 12.7 % (ref 11.5–15.5)
WBC Count: 5.1 10*3/uL (ref 4.0–10.5)
nRBC: 0 % (ref 0.0–0.2)

## 2023-06-27 LAB — CMP (CANCER CENTER ONLY)
ALT: 12 U/L (ref 0–44)
AST: 14 U/L — ABNORMAL LOW (ref 15–41)
Albumin: 4 g/dL (ref 3.5–5.0)
Alkaline Phosphatase: 46 U/L (ref 38–126)
Anion gap: 4 — ABNORMAL LOW (ref 5–15)
BUN: 19 mg/dL (ref 6–20)
CO2: 29 mmol/L (ref 22–32)
Calcium: 9.2 mg/dL (ref 8.9–10.3)
Chloride: 107 mmol/L (ref 98–111)
Creatinine: 0.93 mg/dL (ref 0.44–1.00)
GFR, Estimated: >60 mL/min (ref >60)
Glucose, Bld: 98 mg/dL (ref 70–99)
Potassium: 4.5 mmol/L (ref 3.5–5.1)
Sodium: 140 mmol/L (ref 135–145)
Total Bilirubin: 0.3 mg/dL (ref 0.3–1.2)
Total Protein: 6.3 g/dL — ABNORMAL LOW (ref 6.5–8.1)

## 2023-06-28 LAB — CANCER ANTIGEN 27.29: CA 27.29: <9 U/mL (ref 0.0–38.6)

## 2023-06-30 ENCOUNTER — Encounter: Payer: Self-pay | Admitting: Nurse Practitioner

## 2023-07-18 DIAGNOSIS — Z1211 Encounter for screening for malignant neoplasm of colon: Secondary | ICD-10-CM | POA: Diagnosis not present

## 2023-07-18 DIAGNOSIS — Z1212 Encounter for screening for malignant neoplasm of rectum: Secondary | ICD-10-CM | POA: Diagnosis not present

## 2023-10-04 ENCOUNTER — Other Ambulatory Visit: Payer: Self-pay | Admitting: Nurse Practitioner

## 2023-12-10 ENCOUNTER — Other Ambulatory Visit: Payer: Self-pay | Admitting: Nurse Practitioner

## 2023-12-10 DIAGNOSIS — G629 Polyneuropathy, unspecified: Secondary | ICD-10-CM

## 2023-12-10 DIAGNOSIS — C50412 Malignant neoplasm of upper-outer quadrant of left female breast: Secondary | ICD-10-CM

## 2023-12-11 ENCOUNTER — Encounter: Payer: Self-pay | Admitting: Hematology

## 2024-05-27 ENCOUNTER — Telehealth: Payer: Self-pay | Admitting: Nurse Practitioner

## 2024-05-27 NOTE — Telephone Encounter (Signed)
 Rescheduled patient appointment on 12/15. Called and left a voicemail with new day and time.

## 2024-06-16 ENCOUNTER — Other Ambulatory Visit: Payer: Self-pay | Admitting: Nurse Practitioner

## 2024-06-16 DIAGNOSIS — G629 Polyneuropathy, unspecified: Secondary | ICD-10-CM

## 2024-06-16 DIAGNOSIS — C50412 Malignant neoplasm of upper-outer quadrant of left female breast: Secondary | ICD-10-CM

## 2024-06-19 ENCOUNTER — Encounter: Payer: Self-pay | Admitting: Hematology

## 2024-07-02 ENCOUNTER — Ambulatory Visit: Payer: BC Managed Care – PPO | Admitting: Nurse Practitioner

## 2024-07-02 ENCOUNTER — Other Ambulatory Visit: Payer: BC Managed Care – PPO

## 2024-07-03 ENCOUNTER — Other Ambulatory Visit: Payer: Self-pay

## 2024-07-03 DIAGNOSIS — C50412 Malignant neoplasm of upper-outer quadrant of left female breast: Secondary | ICD-10-CM

## 2024-07-03 DIAGNOSIS — E559 Vitamin D deficiency, unspecified: Secondary | ICD-10-CM

## 2024-07-03 DIAGNOSIS — Z1501 Genetic susceptibility to malignant neoplasm of breast: Secondary | ICD-10-CM

## 2024-07-03 NOTE — Assessment & Plan Note (Addendum)
 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 BRCA 2 +. Treated with b/l mastectomy, adjuvant chemo and radiation.  -S/p BSO for BRCA2(+) she changed from tamoxifen  to AI, but had allergic reaction and switched back to tamoxifen , tolerating well, goal 7-10 years -Tolerating tamoxifen  without significant side effects.   -She is over 5 years from initial diagnosis, the recurrence risk has decreased.  Continue surveillance and tamoxifen .  She would like to follow-up annually.  She agrees to stagger our visits with OB/GYN with breast exam every 6 months, -No role for mammograms given bilateral mastectomy -07/04/2024 -benign exam.  Continue tamoxifen  daily. -Follow-up in 1 year, or sooner if needed

## 2024-07-03 NOTE — Progress Notes (Signed)
 Patient Care Team: Madison Leita DEL, MD as PCP - General (Internal Medicine) Madison Shoulders, MD as Consulting Physician (General Surgery) Madison Callander, MD as Consulting Physician (Hematology) Madison Rush, MD as Consulting Physician (Radiation Oncology) Madison Ade, MD as Consulting Physician (Obstetrics and Gynecology) Madison Savage, Madison Pickle, NP as Nurse Practitioner (Hematology and Oncology) Madison Callander, MD as Consulting Physician (Hematology)  Clinic Day:  07/04/2024  Referring physician: Stephane Leita DEL, MD  ASSESSMENT & PLAN:   Assessment & Plan: 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 [facility]) 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 BRCA 2 +. Treated with b/l mastectomy, adjuvant chemo and radiation.  -S/p BSO for BRCA2(+) she changed from tamoxifen  to AI, but had allergic reaction and switched back to tamoxifen , tolerating well, goal 7-10 years -Tolerating tamoxifen  without significant side effects.   -She is over 5 years from initial diagnosis, the recurrence risk has decreased.  Continue surveillance and tamoxifen .  She would like to follow-up annually.  She agrees to stagger our visits with OB/GYN with breast exam every 6 months, -No role for mammograms given bilateral mastectomy -07/04/2024 -benign exam.  Continue tamoxifen  daily. -Follow-up in 1 year, or sooner if needed   Plan Labs reviewed. - CBC and CMP are unremarkable. - CA 27-29 is normal at <9.0. Benign exam of bilateral chest wall and axillary regions.  No role for mammogram. Continue tamoxifen  daily. Plan for labs and follow-up in 1 year.  She will see GYN provider in approximately 6 months.  The patient understands the plans discussed today and is in agreement with them.  She knows to contact our office if she develops concerns prior to her next appointment.  I provided  25 minutes of face-to-face time during this encounter and > 50% was spent counseling as documented under my assessment and plan.    Madison FORBES Lessen, NP  [facility] - A DEPT OF Madison Savage. [facility] [address] [place] [address] Dept: [phone] Dept Fax: [phone]   No orders of the defined types were placed in this encounter.     CHIEF COMPLAINT:  CC: BRCA2 + left breast cancer  Current Treatment: Tamoxifen  starting 03/2018  INTERVAL HISTORY:  Madison Savage is here today for repeat clinical assessment.  She last saw me on 06/27/2023.  She continues to take tamoxifen  daily.  She does have mild hot flashes and night sweats which are manageable.  She is doing well overall.  She denies presence of palpable nodules or masses along the chest wall or under the arms.  She denies chest pain, chest pressure, or shortness of breath. She denies headaches or visual disturbances. She denies abdominal pain, nausea, vomiting, or changes in bowel or bladder habits.  She denies fevers or chills. She denies pain. Her appetite is good. Her weight has increased 4 pounds over last 1 year.  I have reviewed the past medical history, past surgical history, social history and family history with the patient and they are unchanged from previous note.  ALLERGIES:  is allergic to clindamycin, contrast media [iodinated contrast media], penicillins, and codeine.  MEDICATIONS:  Current Outpatient Medications  Medication Sig Dispense Refill   ascorbic acid  (VITAMIN C) 500 MG tablet Take by mouth.     b complex vitamins capsule Take 1 capsule by mouth daily.     Cholecalciferol  (  VITAMIN D -3 PO) Take 1 capsule by mouth daily.     gabapentin  (NEURONTIN ) 100 MG capsule TAKE 3 CAPSULES BY MOUTH AT BEDTIME 90 capsule 5   hydrOXYzine (ATARAX) 25 MG tablet Take 25 mg by mouth every 6 (six) hours as needed.     MAGNESIUM  PO Take 1 tablet by mouth daily. Take  Muscle cramps     Misc Natural Products (CALCIUM  PLUS ADVANCED PO) Take 1 tablet by mouth daily.     Na Sulfate-K Sulfate-Mg Sulf 17.5-3.13-1.6 GM/177ML SOLN Take 1 kit by mouth as directed. May use generic Suprep, no prior authorization. Take as directed. 354 mL 0   OVER THE COUNTER MEDICATION Take 1 capsule by mouth daily. Biotin/MSM     tamoxifen  (NOLVADEX ) 20 MG tablet TAKE 1 TABLET BY MOUTH EVERY DAY 90 tablet 3   triamcinolone cream (KENALOG) 0.1 % Apply 1 Application topically 3 (three) times daily.     No current facility-administered medications for this visit.   Facility-Administered Medications Ordered in Other Visits  Medication Dose Route Frequency Provider Last Rate Last Admin   sodium chloride  flush (NS) 0.9 % injection 10 mL  10 mL Intravenous PRN Madison Callander, MD   10 mL at 07/21/17 1323    HISTORY OF PRESENT ILLNESS:   Oncology History Overview Note  Cancer Staging Breast cancer metastasized to axillary lymph node, left (HCC) Staging form: Breast, AJCC 8th Edition - Clinical stage from 03/22/2017: Stage Unknown (cTX, cN1, cM0, GX, ER: Positive, PR: Positive, HER2: Negative) - Signed by Madison Callander, MD on 03/29/2017 - Pathologic stage from 04/27/2017: Stage IB (pT1c, pN2a, cM0, G2, ER: Positive, PR: Positive, HER2: Negative) - Signed by Madison Callander, MD on 05/25/2017 \   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 (HCC)  03/21/2017 Mammogram   Diagnostic mammogram bilateral 03/21/17 IMPRESSION: INCOMPLETE - ADDITIONAL IMAGING EVALUATION NEEDED 1. The 0.6 cm oval mass in the right breast at 11 o'clock middle depth is indeterminate. An ultrasound is recommended.  2. The 0.5cm round focal asymmetry in the left breast central to the nipple middle depth is indeterminate. An ultrasound is recommended.  3. Ultrasound of the palpable abnormalities in the axilla and far left lateral breast is recommended.  4. Multiple clusters of pleomorphic calcifications in the left  upper outer breast anterior depth spanning a 2.5 cm area are highly suggestive of malignancy. A stereotactic biopsy is recommended.     03/21/2017 Imaging   US  Breast bilateral 03/21/17 IMPRESSION: HIGHLY SUGGESTIVE OF MALIGNANCY 1. Six distinct abnormal left axillary nodes, 2 of which correspond to the palpable lumps felt by the patient. Findings concerning for metastatic adenopathy. Ultrasound guided biopsy of one of these nodes is recommended.  2. Small 5 mm palpable, oval mass in the left breast 3:00 areolar margin is at an intermediate suspicion. Ultrasound guided biopsy is recommended.  3. Isoechoic 8 mm mass in the right breast 10:00 5 cm from the nipple is also an intermediate suspicion for malignancy, and ultrasound guided biopsy is recommended.  4. Stereotactic guided biopsy for the highly suspicious calcifications in the left breast also recommended as detailed in the mammography report.     03/22/2017 Initial Biopsy   Diagnosis 03/22/17 Lymph node, needle/core biopsy, left axillary node -METASTATIC CARCINOMA, SEE COMMENT Microscopic Comment   03/22/2017 Receptors her2   Lymph node biopsy ER 95% positive, strong staining, PR 5% positive, weak staining, HER-2 negative, with HER2/CEP17 ratio 1.23 and copy #3.95.   03/27/2017 Pathology Results  Diagnosis 03/27/17 1. Breast, left, needle core biopsy -FIBROADENOMATOID NODULE WITH CALCIFICATIONS 2.Breast, left, needle core biopsy -DUCTAL CARCINOMA IN SITU, HIGH GRADE -SEE COMMENT  3. Breast, right, needle, core biopsy -FIBROADENOMA -NO MALIGNANCY IDENTIFIED    03/29/2017 Initial Diagnosis   Breast cancer metastasized to axillary lymph node, left (HCC)   04/07/2017 Imaging   CT CAP 04/07/17 IMPRESSION: 1. Several prominent left axillary lymph nodes which may correspond to biopsy proven axillary lymph node metastasis. 2. No thoracic adenopathy identified or evidence of distant metastatic disease.   04/07/2017 Imaging   Bone whole  body scan 04/07/17 IMPRESSION: Negative for evidence of osseous metastatic disease.   04/17/2017 Genetic Testing   BRCA2 c.[id number] (p.Glu260Serfs*15) pathogenic mutation identified on the 9 gene STAT panel.  The STAT Breast cancer panel offered by [facility] includes sequencing and rearrangement analysis for the following 9 genes:  ATM, BRCA1, BRCA2, CDH1, CHEK2, PALB2, PTEN, STK11 and TP53.   The report date is April 17, 2017.  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  The Hereditary Gene Panel offered by [facility] includes sequencing and/or deletion duplication testing of the following 46 genes: APC, ATM, AXIN2, BARD1, BMPR1A, BRCA1, BRCA2, BRIP1, CDH1, CDKN2A (p14ARF), CDKN2A (p16INK4a), CHEK2, CTNNA1, DICER1, EPCAM (Deletion/duplication testing only), GREM1 (promoter region deletion/duplication testing only), KIT, MEN1, MLH1, MSH2, MSH3, MSH6, MUTYH, NBN, NF1, NHTL1, PALB2, PDGFRA, PMS2, POLD1, POLE, PTEN, RAD50, RAD51C, RAD51D, SDHB, SDHC, SDHD, SMAD4, SMARCA4. STK11, TP53, TSC1, TSC2, and VHL.  The following genes were evaluated for sequence changes only: SDHA and HOXB13 c.251G>A variant only.  The report date is April 29, 2017.    04/27/2017 Surgery   Surgey 04/27/17 BILATERAL SKIN SPARING MASTECTOMY and LEFT AXILLARY LYMPH NODE and BILATERAL BREAST RECONSTRUCTION WITH PLACEMENT OF TISSUE EXPANDER AND FLEX HD (ACELLULAR HYDRATED DERMIS) Bilateral BY Dr. Aron and Dr. Lowery    04/27/2017 Pathology Results    Diagnosis  04/27/17 1. Breast, simple mastectomy, Right - FIBROCYSTIC CHANGES WITH ADENOSIS. - USUAL DUCTAL HYPERPLASIA. - HEALING BIOPSY SITE. - THERE IS NO EVIDENCE OF MALIGNANCY. 2. Breast, simple mastectomy, Left - INVASIVE DUCTAL CARCINOMA, GRADE II/III, SPANNING 1.2 CM. - DUCTAL CARCINOMA IN SITU WITH CALCIFICATIONS, HIGH GRADE. - INVASIVE DUCTAL CARCINOMA IS  FOCALLY PRESENT AT THE ANTERIOR MARGIN. - DUCTAL CARCINOMA IN SITU IS BROADLY 0.1 CM TO THE POSTERIOR MARGIN. - SEE ONCOLOGY TABLE BELOW. 3. Lymph nodes, regional resection, Left axillary - METASTATIC CARCINOMA IN 8 OF 21 LYMPH NODES (8/21). - SEE COMMENT   06/09/2017 - 11/16/2017 Chemotherapy   Adriamycin  and Cytoxan  every 2 weeks, for 4 cycles 06/09/17-07/21/17, followed by weekly Abraxane  and carboplatin  for 12 weeks starting 08/11/17. Held Carbo with cycle 2 due to poor toleration. Changed to single agent Abraxane  for Cycle 3 on 09/08/17. Completed on 11/16/17 with reduced Abraxane  due to body aches.    12/06/2017 Surgery   BREAST CAPSULECTOMY WITH IMPLANT EXCHANGE and PLACEMENT OF BREAST IMPLANTS and REMOVAL PORT-A-CATH  by Dr. Lowery on 12/06/17   12/18/2017 Survivorship   Survivorship clinic with NP Madison    01/03/2018 - 02/19/2018 Radiation Therapy   Radiation therapy 01/03/18 - 02/19/18.   03/2018 -  Anti-estrogen oral therapy   Adjuvant tamoxifen  started in July 2019, switched to letrozole  started 09/2018 after BSO but due to allergy reaction with hives, ithcing and swelling she d/c and restarted Tamoxifen .     08/31/2018 Surgery   08/31/2018 BSO With Dr. Eloy  Diagnosis  1. Ovary and fallopian tube, right RIGHT OVARY AND RIGHT FALLOPIAN TUBE: - UNREMARKABLE. - NO ENDOMETRIOSIS OR MALIGNANCY. 2. Ovary and fallopian tube, left LEFT OVARY AND LEFT FALLOPIAN TUBE: - UNREMARKABLE. - NO ENDOMETRIOSIS OR MALIGNANCY.       REVIEW OF SYSTEMS:   Constitutional: Denies fevers, chills or abnormal weight loss Eyes: Denies blurriness of vision Ears, nose, mouth, throat, and face: Denies mucositis or sore throat Respiratory: Denies cough, dyspnea or wheezes Cardiovascular: Denies palpitation, chest discomfort or lower extremity swelling Gastrointestinal:  Denies nausea, heartburn or change in bowel habits Skin: Denies abnormal skin rashes Lymphatics: Denies new lymphadenopathy or  easy bruising Neurological:Denies numbness, tingling or new weaknesses Behavioral/Psych: Mood is stable, no new changes  All other systems were reviewed with the patient and are negative.   VITALS:   Today's Vitals   07/04/24 1031 07/04/24 1033  BP: 104/70   Pulse: 70   Resp: 14   Temp: 98.1 F (36.7 C)   TempSrc: Temporal   SpO2: 98%   Weight: 128 lb 12.8 oz (58.4 kg)   PainSc:  0-No pain   Body mass index is 19.58 kg/m.   Wt Readings from Last 3 Encounters:  07/04/24 128 lb 12.8 oz (58.4 kg)  06/27/23 124 lb 12.8 oz (56.6 kg)  06/30/22 117 lb (53.1 kg)    Body mass index is 19.58 kg/m.  Performance status (ECOG): 1 - Symptomatic but completely ambulatory  PHYSICAL EXAM:   GENERAL:alert, no distress and comfortable SKIN: skin color, texture, turgor are normal, no rashes or significant lesions EYES: normal, Conjunctiva are pink and non-injected, sclera clear OROPHARYNX:no exudate, no erythema and lips, buccal mucosa, and tongue normal  NECK: supple, thyroid  normal size, non-tender, without nodularity LYMPH:  no palpable lymphadenopathy in the cervical, axillary or inguinal LUNGS: clear to auscultation and percussion with normal breathing effort HEART: regular rate & rhythm and no murmurs and no lower extremity edema ABDOMEN:abdomen soft, non-tender and normal bowel sounds Musculoskeletal:no cyanosis of digits and no clubbing  NEURO: alert & oriented x 3 with fluent speech, no focal motor/sensory deficits BREAST: The right breast has intact implant with no palpable nodules or masses along the chest wall or surgical line.  There is no axillary lymphadenopathy on the right.  The left breast has an intact implant with no palpable nodules or masses chest wall or surgical line.  There is no axillary lymphadenopathy on the left.  LABORATORY DATA:  I have reviewed the data as listed    Component Value Date/Time   NA 141 07/04/2024 1004   NA 139 09/08/2017 1024   K 4.3  07/04/2024 1004   K 4.0 09/08/2017 1024   CL 108 07/04/2024 1004   CO2 28 07/04/2024 1004   CO2 26 09/08/2017 1024   GLUCOSE 109 (H) 07/04/2024 1004   GLUCOSE 99 09/08/2017 1024   BUN 17 07/04/2024 1004   BUN 13.6 09/08/2017 1024   CREATININE 1.05 (H) 07/04/2024 1004   CREATININE 0.8 09/08/2017 1024   CALCIUM  9.0 07/04/2024 1004   CALCIUM  9.1 09/08/2017 1024   PROT 6.2 (L) 07/04/2024 1004   PROT 6.3 (L) 09/08/2017 1024   ALBUMIN 3.9 07/04/2024 1004   ALBUMIN 3.9 09/08/2017 1024   AST 17 07/04/2024 1004   AST 14 09/08/2017 1024   ALT 14 07/04/2024 1004   ALT 14 09/08/2017 1024   ALKPHOS 51 07/04/2024 1004   ALKPHOS 75 09/08/2017 1024   BILITOT 0.3 07/04/2024 1004   BILITOT 0.30 09/08/2017  1024   GFRNONAA >60 07/04/2024 1004   GFRAA >60 04/27/2020 1341   GFRAA >60 11/28/2019 1051   Lab Results  Component Value Date   WBC 4.7 07/04/2024   NEUTROABS 2.5 07/04/2024   HGB 14.0 07/04/2024   HCT 40.4 07/04/2024   MCV 91.2 07/04/2024   PLT 161 07/04/2024

## 2024-07-04 ENCOUNTER — Inpatient Hospital Stay: Attending: Nurse Practitioner

## 2024-07-04 ENCOUNTER — Inpatient Hospital Stay (HOSPITAL_BASED_OUTPATIENT_CLINIC_OR_DEPARTMENT_OTHER): Admitting: Nurse Practitioner

## 2024-07-04 VITALS — BP 104/70 | HR 70 | Temp 98.1°F | Resp 14 | Wt 128.8 lb

## 2024-07-04 DIAGNOSIS — Z79810 Long term (current) use of selective estrogen receptor modulators (SERMs): Secondary | ICD-10-CM | POA: Insufficient documentation

## 2024-07-04 DIAGNOSIS — Z170 Estrogen receptor positive status [ER+]: Secondary | ICD-10-CM | POA: Diagnosis not present

## 2024-07-04 DIAGNOSIS — E559 Vitamin D deficiency, unspecified: Secondary | ICD-10-CM

## 2024-07-04 DIAGNOSIS — C773 Secondary and unspecified malignant neoplasm of axilla and upper limb lymph nodes: Secondary | ICD-10-CM | POA: Diagnosis present

## 2024-07-04 DIAGNOSIS — C50412 Malignant neoplasm of upper-outer quadrant of left female breast: Secondary | ICD-10-CM | POA: Diagnosis present

## 2024-07-04 DIAGNOSIS — Z1721 Progesterone receptor positive status: Secondary | ICD-10-CM | POA: Insufficient documentation

## 2024-07-04 DIAGNOSIS — Z15068 Genetic susceptibility to other malignant neoplasm of digestive system: Secondary | ICD-10-CM

## 2024-07-04 DIAGNOSIS — Z1321 Encounter for screening for nutritional disorder: Secondary | ICD-10-CM

## 2024-07-04 DIAGNOSIS — Z923 Personal history of irradiation: Secondary | ICD-10-CM | POA: Diagnosis not present

## 2024-07-04 DIAGNOSIS — Z1731 Human epidermal growth factor receptor 2 positive status: Secondary | ICD-10-CM | POA: Insufficient documentation

## 2024-07-04 LAB — CBC WITH DIFFERENTIAL (CANCER CENTER ONLY)
Abs Immature Granulocytes: 0.01 K/uL (ref 0.00–0.07)
Basophils Absolute: 0 K/uL (ref 0.0–0.1)
Basophils Relative: 0 %
Eosinophils Absolute: 0.1 K/uL (ref 0.0–0.5)
Eosinophils Relative: 2 %
HCT: 40.4 % (ref 36.0–46.0)
Hemoglobin: 14 g/dL (ref 12.0–15.0)
Immature Granulocytes: 0 %
Lymphocytes Relative: 33 %
Lymphs Abs: 1.6 K/uL (ref 0.7–4.0)
MCH: 31.6 pg (ref 26.0–34.0)
MCHC: 34.7 g/dL (ref 30.0–36.0)
MCV: 91.2 fL (ref 80.0–100.0)
Monocytes Absolute: 0.5 K/uL (ref 0.1–1.0)
Monocytes Relative: 11 %
Neutro Abs: 2.5 K/uL (ref 1.7–7.7)
Neutrophils Relative %: 54 %
Platelet Count: 161 K/uL (ref 150–400)
RBC: 4.43 MIL/uL (ref 3.87–5.11)
RDW: 13 % (ref 11.5–15.5)
WBC Count: 4.7 K/uL (ref 4.0–10.5)
nRBC: 0 % (ref 0.0–0.2)

## 2024-07-04 LAB — CMP (CANCER CENTER ONLY)
ALT: 14 U/L (ref 0–44)
AST: 17 U/L (ref 15–41)
Albumin: 3.9 g/dL (ref 3.5–5.0)
Alkaline Phosphatase: 51 U/L (ref 38–126)
Anion gap: 5 (ref 5–15)
BUN: 17 mg/dL (ref 6–20)
CO2: 28 mmol/L (ref 22–32)
Calcium: 9 mg/dL (ref 8.9–10.3)
Chloride: 108 mmol/L (ref 98–111)
Creatinine: 1.05 mg/dL — ABNORMAL HIGH (ref 0.44–1.00)
GFR, Estimated: >60 mL/min (ref >60)
Glucose, Bld: 109 mg/dL — ABNORMAL HIGH (ref 70–99)
Potassium: 4.3 mmol/L (ref 3.5–5.1)
Sodium: 141 mmol/L (ref 135–145)
Total Bilirubin: 0.3 mg/dL (ref 0.0–1.2)
Total Protein: 6.2 g/dL — ABNORMAL LOW (ref 6.5–8.1)

## 2024-07-05 LAB — CANCER ANTIGEN 27.29: CA 27.29: <9 U/mL (ref 0.0–38.6)

## 2024-07-17 ENCOUNTER — Encounter: Payer: Self-pay | Admitting: Nurse Practitioner

## 2024-07-17 ENCOUNTER — Encounter: Payer: Self-pay | Admitting: Hematology

## 2024-09-27 ENCOUNTER — Other Ambulatory Visit: Payer: Self-pay | Admitting: Nurse Practitioner

## 2025-07-04 ENCOUNTER — Inpatient Hospital Stay

## 2025-07-04 ENCOUNTER — Inpatient Hospital Stay: Admitting: Nurse Practitioner
